# Patient Record
Sex: Female | Born: 1940 | Race: White | Hispanic: No | Marital: Married | Smoking: Former smoker
Health system: Southern US, Community
[De-identification: ages and names within clinical notes are randomized; demographics above are authoritative.]

## PROBLEM LIST (undated history)

## (undated) DIAGNOSIS — E213 Hyperparathyroidism, unspecified: Secondary | ICD-10-CM

## (undated) DIAGNOSIS — E785 Hyperlipidemia, unspecified: Secondary | ICD-10-CM

## (undated) DIAGNOSIS — I10 Essential (primary) hypertension: Secondary | ICD-10-CM

## (undated) DIAGNOSIS — J449 Chronic obstructive pulmonary disease, unspecified: Secondary | ICD-10-CM

## (undated) DIAGNOSIS — M1990 Unspecified osteoarthritis, unspecified site: Secondary | ICD-10-CM

## (undated) DIAGNOSIS — K219 Gastro-esophageal reflux disease without esophagitis: Secondary | ICD-10-CM

## (undated) DIAGNOSIS — G2581 Restless legs syndrome: Secondary | ICD-10-CM

## (undated) DIAGNOSIS — C801 Malignant (primary) neoplasm, unspecified: Secondary | ICD-10-CM

## (undated) DIAGNOSIS — M810 Age-related osteoporosis without current pathological fracture: Secondary | ICD-10-CM

## (undated) DIAGNOSIS — N184 Chronic kidney disease, stage 4 (severe): Secondary | ICD-10-CM

## (undated) HISTORY — PX: ABDOMINAL HYSTERECTOMY: SHX81

## (undated) HISTORY — PX: CHOLECYSTECTOMY: SHX55

## (undated) HISTORY — DX: Restless legs syndrome: G25.81

## (undated) HISTORY — DX: Chronic obstructive pulmonary disease, unspecified: J44.9

## (undated) HISTORY — DX: Hyperlipidemia, unspecified: E78.5

## (undated) MED FILL — Gemcitabine HCl Inj 1 GM/26.3ML (38 MG/ML) (Base Equiv): INTRAVENOUS | Qty: 1.2 | Status: AC

## (undated) MED FILL — Gemcitabine HCl Inj 1 GM/26.3ML (38 MG/ML) (Base Equiv): INTRAVENOUS | Qty: 1 | Status: AC

---

## 1997-06-11 HISTORY — PX: OTHER SURGICAL HISTORY: SHX169

## 1997-06-11 HISTORY — PX: TOTAL NEPHRECTOMY: SHX415

## 2011-06-29 DIAGNOSIS — M899 Disorder of bone, unspecified: Secondary | ICD-10-CM | POA: Diagnosis not present

## 2011-06-29 DIAGNOSIS — I10 Essential (primary) hypertension: Secondary | ICD-10-CM | POA: Diagnosis not present

## 2011-06-29 DIAGNOSIS — M949 Disorder of cartilage, unspecified: Secondary | ICD-10-CM | POA: Diagnosis not present

## 2011-06-29 DIAGNOSIS — R5383 Other fatigue: Secondary | ICD-10-CM | POA: Diagnosis not present

## 2011-06-29 DIAGNOSIS — J449 Chronic obstructive pulmonary disease, unspecified: Secondary | ICD-10-CM | POA: Diagnosis not present

## 2011-06-29 DIAGNOSIS — E7800 Pure hypercholesterolemia, unspecified: Secondary | ICD-10-CM | POA: Diagnosis not present

## 2011-08-01 DIAGNOSIS — J019 Acute sinusitis, unspecified: Secondary | ICD-10-CM | POA: Diagnosis not present

## 2011-09-27 DIAGNOSIS — K319 Disease of stomach and duodenum, unspecified: Secondary | ICD-10-CM | POA: Diagnosis not present

## 2011-09-28 DIAGNOSIS — E7800 Pure hypercholesterolemia, unspecified: Secondary | ICD-10-CM | POA: Diagnosis not present

## 2011-09-28 DIAGNOSIS — J4489 Other specified chronic obstructive pulmonary disease: Secondary | ICD-10-CM | POA: Diagnosis not present

## 2011-09-28 DIAGNOSIS — J449 Chronic obstructive pulmonary disease, unspecified: Secondary | ICD-10-CM | POA: Diagnosis not present

## 2011-09-28 DIAGNOSIS — I10 Essential (primary) hypertension: Secondary | ICD-10-CM | POA: Diagnosis not present

## 2011-10-29 DIAGNOSIS — K2901 Acute gastritis with bleeding: Secondary | ICD-10-CM | POA: Diagnosis not present

## 2011-10-29 DIAGNOSIS — K219 Gastro-esophageal reflux disease without esophagitis: Secondary | ICD-10-CM | POA: Diagnosis not present

## 2011-11-20 DIAGNOSIS — R0902 Hypoxemia: Secondary | ICD-10-CM | POA: Diagnosis not present

## 2011-11-26 DIAGNOSIS — M109 Gout, unspecified: Secondary | ICD-10-CM | POA: Diagnosis not present

## 2012-01-16 DIAGNOSIS — E782 Mixed hyperlipidemia: Secondary | ICD-10-CM | POA: Diagnosis not present

## 2012-01-16 DIAGNOSIS — Z79899 Other long term (current) drug therapy: Secondary | ICD-10-CM | POA: Diagnosis not present

## 2012-01-16 DIAGNOSIS — I10 Essential (primary) hypertension: Secondary | ICD-10-CM | POA: Diagnosis not present

## 2012-01-16 DIAGNOSIS — E7800 Pure hypercholesterolemia, unspecified: Secondary | ICD-10-CM | POA: Diagnosis not present

## 2012-01-16 DIAGNOSIS — J449 Chronic obstructive pulmonary disease, unspecified: Secondary | ICD-10-CM | POA: Diagnosis not present

## 2012-01-22 DIAGNOSIS — Z1231 Encounter for screening mammogram for malignant neoplasm of breast: Secondary | ICD-10-CM | POA: Diagnosis not present

## 2012-03-31 DIAGNOSIS — K219 Gastro-esophageal reflux disease without esophagitis: Secondary | ICD-10-CM | POA: Diagnosis not present

## 2012-03-31 DIAGNOSIS — K5900 Constipation, unspecified: Secondary | ICD-10-CM | POA: Diagnosis not present

## 2012-03-31 DIAGNOSIS — Z1211 Encounter for screening for malignant neoplasm of colon: Secondary | ICD-10-CM | POA: Diagnosis not present

## 2012-04-02 DIAGNOSIS — J029 Acute pharyngitis, unspecified: Secondary | ICD-10-CM | POA: Diagnosis not present

## 2012-04-23 DIAGNOSIS — J449 Chronic obstructive pulmonary disease, unspecified: Secondary | ICD-10-CM | POA: Diagnosis not present

## 2012-04-23 DIAGNOSIS — Z23 Encounter for immunization: Secondary | ICD-10-CM | POA: Diagnosis not present

## 2012-04-23 DIAGNOSIS — E7800 Pure hypercholesterolemia, unspecified: Secondary | ICD-10-CM | POA: Diagnosis not present

## 2012-04-23 DIAGNOSIS — I10 Essential (primary) hypertension: Secondary | ICD-10-CM | POA: Diagnosis not present

## 2012-04-24 DIAGNOSIS — D129 Benign neoplasm of anus and anal canal: Secondary | ICD-10-CM | POA: Diagnosis not present

## 2012-04-24 DIAGNOSIS — K621 Rectal polyp: Secondary | ICD-10-CM | POA: Diagnosis not present

## 2012-04-24 DIAGNOSIS — K5730 Diverticulosis of large intestine without perforation or abscess without bleeding: Secondary | ICD-10-CM | POA: Diagnosis not present

## 2012-05-09 DIAGNOSIS — K56609 Unspecified intestinal obstruction, unspecified as to partial versus complete obstruction: Secondary | ICD-10-CM | POA: Diagnosis not present

## 2012-07-21 DIAGNOSIS — J029 Acute pharyngitis, unspecified: Secondary | ICD-10-CM | POA: Diagnosis not present

## 2012-08-05 DIAGNOSIS — J449 Chronic obstructive pulmonary disease, unspecified: Secondary | ICD-10-CM | POA: Diagnosis not present

## 2012-08-05 DIAGNOSIS — M4800 Spinal stenosis, site unspecified: Secondary | ICD-10-CM | POA: Diagnosis not present

## 2012-08-05 DIAGNOSIS — E7800 Pure hypercholesterolemia, unspecified: Secondary | ICD-10-CM | POA: Diagnosis not present

## 2012-09-25 DIAGNOSIS — J019 Acute sinusitis, unspecified: Secondary | ICD-10-CM | POA: Diagnosis not present

## 2012-09-25 DIAGNOSIS — J309 Allergic rhinitis, unspecified: Secondary | ICD-10-CM | POA: Diagnosis not present

## 2012-11-05 DIAGNOSIS — J309 Allergic rhinitis, unspecified: Secondary | ICD-10-CM | POA: Diagnosis not present

## 2012-11-05 DIAGNOSIS — E210 Primary hyperparathyroidism: Secondary | ICD-10-CM | POA: Diagnosis not present

## 2012-11-05 DIAGNOSIS — I10 Essential (primary) hypertension: Secondary | ICD-10-CM | POA: Diagnosis not present

## 2012-11-05 DIAGNOSIS — E7800 Pure hypercholesterolemia, unspecified: Secondary | ICD-10-CM | POA: Diagnosis not present

## 2012-11-21 DIAGNOSIS — M899 Disorder of bone, unspecified: Secondary | ICD-10-CM | POA: Diagnosis not present

## 2012-11-21 DIAGNOSIS — M949 Disorder of cartilage, unspecified: Secondary | ICD-10-CM | POA: Diagnosis not present

## 2012-11-21 DIAGNOSIS — M810 Age-related osteoporosis without current pathological fracture: Secondary | ICD-10-CM | POA: Diagnosis not present

## 2012-12-30 DIAGNOSIS — J029 Acute pharyngitis, unspecified: Secondary | ICD-10-CM | POA: Diagnosis not present

## 2012-12-30 DIAGNOSIS — J019 Acute sinusitis, unspecified: Secondary | ICD-10-CM | POA: Diagnosis not present

## 2013-02-02 DIAGNOSIS — S92309A Fracture of unspecified metatarsal bone(s), unspecified foot, initial encounter for closed fracture: Secondary | ICD-10-CM | POA: Diagnosis not present

## 2013-02-23 DIAGNOSIS — S92309A Fracture of unspecified metatarsal bone(s), unspecified foot, initial encounter for closed fracture: Secondary | ICD-10-CM | POA: Diagnosis not present

## 2013-03-03 DIAGNOSIS — Z1231 Encounter for screening mammogram for malignant neoplasm of breast: Secondary | ICD-10-CM | POA: Diagnosis not present

## 2013-03-09 DIAGNOSIS — Z23 Encounter for immunization: Secondary | ICD-10-CM | POA: Diagnosis not present

## 2013-03-09 DIAGNOSIS — G609 Hereditary and idiopathic neuropathy, unspecified: Secondary | ICD-10-CM | POA: Diagnosis not present

## 2013-03-09 DIAGNOSIS — E785 Hyperlipidemia, unspecified: Secondary | ICD-10-CM | POA: Diagnosis not present

## 2013-03-09 DIAGNOSIS — I10 Essential (primary) hypertension: Secondary | ICD-10-CM | POA: Diagnosis not present

## 2013-03-09 DIAGNOSIS — E210 Primary hyperparathyroidism: Secondary | ICD-10-CM | POA: Diagnosis not present

## 2013-05-06 DIAGNOSIS — E875 Hyperkalemia: Secondary | ICD-10-CM | POA: Diagnosis not present

## 2013-05-06 DIAGNOSIS — J449 Chronic obstructive pulmonary disease, unspecified: Secondary | ICD-10-CM | POA: Diagnosis not present

## 2013-05-06 DIAGNOSIS — I10 Essential (primary) hypertension: Secondary | ICD-10-CM | POA: Diagnosis not present

## 2013-05-14 DIAGNOSIS — H2510 Age-related nuclear cataract, unspecified eye: Secondary | ICD-10-CM | POA: Diagnosis not present

## 2013-05-14 DIAGNOSIS — H524 Presbyopia: Secondary | ICD-10-CM | POA: Diagnosis not present

## 2013-06-17 DIAGNOSIS — J029 Acute pharyngitis, unspecified: Secondary | ICD-10-CM | POA: Diagnosis not present

## 2013-07-09 DIAGNOSIS — J449 Chronic obstructive pulmonary disease, unspecified: Secondary | ICD-10-CM | POA: Diagnosis not present

## 2013-07-09 DIAGNOSIS — I10 Essential (primary) hypertension: Secondary | ICD-10-CM | POA: Diagnosis not present

## 2013-07-09 DIAGNOSIS — E785 Hyperlipidemia, unspecified: Secondary | ICD-10-CM | POA: Diagnosis not present

## 2013-07-14 DIAGNOSIS — Z1211 Encounter for screening for malignant neoplasm of colon: Secondary | ICD-10-CM | POA: Diagnosis not present

## 2013-10-13 DIAGNOSIS — Z23 Encounter for immunization: Secondary | ICD-10-CM | POA: Diagnosis not present

## 2013-11-09 DIAGNOSIS — E785 Hyperlipidemia, unspecified: Secondary | ICD-10-CM | POA: Diagnosis not present

## 2013-11-09 DIAGNOSIS — J449 Chronic obstructive pulmonary disease, unspecified: Secondary | ICD-10-CM | POA: Diagnosis not present

## 2013-11-09 DIAGNOSIS — I10 Essential (primary) hypertension: Secondary | ICD-10-CM | POA: Diagnosis not present

## 2013-12-29 DIAGNOSIS — Z6829 Body mass index (BMI) 29.0-29.9, adult: Secondary | ICD-10-CM | POA: Diagnosis not present

## 2013-12-29 DIAGNOSIS — M4800 Spinal stenosis, site unspecified: Secondary | ICD-10-CM | POA: Diagnosis not present

## 2014-01-05 DIAGNOSIS — M1610 Unilateral primary osteoarthritis, unspecified hip: Secondary | ICD-10-CM | POA: Diagnosis not present

## 2014-01-05 DIAGNOSIS — M4310 Spondylolisthesis, site unspecified: Secondary | ICD-10-CM | POA: Diagnosis not present

## 2014-02-04 DIAGNOSIS — M545 Low back pain, unspecified: Secondary | ICD-10-CM | POA: Diagnosis not present

## 2014-02-11 DIAGNOSIS — M48061 Spinal stenosis, lumbar region without neurogenic claudication: Secondary | ICD-10-CM | POA: Diagnosis not present

## 2014-02-11 DIAGNOSIS — M4310 Spondylolisthesis, site unspecified: Secondary | ICD-10-CM | POA: Diagnosis not present

## 2014-02-11 DIAGNOSIS — Q619 Cystic kidney disease, unspecified: Secondary | ICD-10-CM | POA: Diagnosis not present

## 2014-02-11 DIAGNOSIS — M47817 Spondylosis without myelopathy or radiculopathy, lumbosacral region: Secondary | ICD-10-CM | POA: Diagnosis not present

## 2014-02-11 DIAGNOSIS — M5137 Other intervertebral disc degeneration, lumbosacral region: Secondary | ICD-10-CM | POA: Diagnosis not present

## 2014-02-16 DIAGNOSIS — M545 Low back pain, unspecified: Secondary | ICD-10-CM | POA: Diagnosis not present

## 2014-02-16 DIAGNOSIS — M4310 Spondylolisthesis, site unspecified: Secondary | ICD-10-CM | POA: Diagnosis not present

## 2014-02-16 DIAGNOSIS — M47817 Spondylosis without myelopathy or radiculopathy, lumbosacral region: Secondary | ICD-10-CM | POA: Diagnosis not present

## 2014-03-09 DIAGNOSIS — M19079 Primary osteoarthritis, unspecified ankle and foot: Secondary | ICD-10-CM | POA: Diagnosis not present

## 2014-03-09 DIAGNOSIS — G894 Chronic pain syndrome: Secondary | ICD-10-CM | POA: Diagnosis not present

## 2014-03-09 DIAGNOSIS — M4310 Spondylolisthesis, site unspecified: Secondary | ICD-10-CM | POA: Diagnosis not present

## 2014-03-09 DIAGNOSIS — M1710 Unilateral primary osteoarthritis, unspecified knee: Secondary | ICD-10-CM | POA: Diagnosis not present

## 2014-03-09 DIAGNOSIS — IMO0002 Reserved for concepts with insufficient information to code with codable children: Secondary | ICD-10-CM | POA: Diagnosis not present

## 2014-03-09 DIAGNOSIS — M47817 Spondylosis without myelopathy or radiculopathy, lumbosacral region: Secondary | ICD-10-CM | POA: Diagnosis not present

## 2014-03-18 DIAGNOSIS — E210 Primary hyperparathyroidism: Secondary | ICD-10-CM | POA: Diagnosis not present

## 2014-03-18 DIAGNOSIS — I10 Essential (primary) hypertension: Secondary | ICD-10-CM | POA: Diagnosis not present

## 2014-03-18 DIAGNOSIS — E785 Hyperlipidemia, unspecified: Secondary | ICD-10-CM | POA: Diagnosis not present

## 2014-03-18 DIAGNOSIS — Z6828 Body mass index (BMI) 28.0-28.9, adult: Secondary | ICD-10-CM | POA: Diagnosis not present

## 2014-03-18 DIAGNOSIS — Z23 Encounter for immunization: Secondary | ICD-10-CM | POA: Diagnosis not present

## 2014-03-18 DIAGNOSIS — G2581 Restless legs syndrome: Secondary | ICD-10-CM | POA: Diagnosis not present

## 2014-03-18 DIAGNOSIS — J449 Chronic obstructive pulmonary disease, unspecified: Secondary | ICD-10-CM | POA: Diagnosis not present

## 2014-03-19 DIAGNOSIS — Z1231 Encounter for screening mammogram for malignant neoplasm of breast: Secondary | ICD-10-CM | POA: Diagnosis not present

## 2014-04-01 DIAGNOSIS — M19079 Primary osteoarthritis, unspecified ankle and foot: Secondary | ICD-10-CM | POA: Diagnosis not present

## 2014-04-01 DIAGNOSIS — M47816 Spondylosis without myelopathy or radiculopathy, lumbar region: Secondary | ICD-10-CM | POA: Diagnosis not present

## 2014-04-01 DIAGNOSIS — M170 Bilateral primary osteoarthritis of knee: Secondary | ICD-10-CM | POA: Diagnosis not present

## 2014-04-01 DIAGNOSIS — G894 Chronic pain syndrome: Secondary | ICD-10-CM | POA: Diagnosis not present

## 2014-04-01 DIAGNOSIS — M4306 Spondylolysis, lumbar region: Secondary | ICD-10-CM | POA: Diagnosis not present

## 2014-05-14 DIAGNOSIS — H26492 Other secondary cataract, left eye: Secondary | ICD-10-CM | POA: Diagnosis not present

## 2014-05-14 DIAGNOSIS — H2511 Age-related nuclear cataract, right eye: Secondary | ICD-10-CM | POA: Diagnosis not present

## 2014-05-20 DIAGNOSIS — H26492 Other secondary cataract, left eye: Secondary | ICD-10-CM | POA: Diagnosis not present

## 2014-06-11 HISTORY — PX: BACK SURGERY: SHX140

## 2014-06-29 DIAGNOSIS — G809 Cerebral palsy, unspecified: Secondary | ICD-10-CM | POA: Diagnosis not present

## 2014-06-29 DIAGNOSIS — M1990 Unspecified osteoarthritis, unspecified site: Secondary | ICD-10-CM | POA: Diagnosis not present

## 2014-06-29 DIAGNOSIS — Z6828 Body mass index (BMI) 28.0-28.9, adult: Secondary | ICD-10-CM | POA: Diagnosis not present

## 2014-07-08 DIAGNOSIS — L578 Other skin changes due to chronic exposure to nonionizing radiation: Secondary | ICD-10-CM | POA: Diagnosis not present

## 2014-07-08 DIAGNOSIS — L820 Inflamed seborrheic keratosis: Secondary | ICD-10-CM | POA: Diagnosis not present

## 2014-07-08 DIAGNOSIS — D485 Neoplasm of uncertain behavior of skin: Secondary | ICD-10-CM | POA: Diagnosis not present

## 2014-07-08 DIAGNOSIS — L859 Epidermal thickening, unspecified: Secondary | ICD-10-CM | POA: Diagnosis not present

## 2014-07-13 DIAGNOSIS — G2581 Restless legs syndrome: Secondary | ICD-10-CM | POA: Diagnosis not present

## 2014-07-13 DIAGNOSIS — G609 Hereditary and idiopathic neuropathy, unspecified: Secondary | ICD-10-CM | POA: Diagnosis not present

## 2014-07-30 DIAGNOSIS — E785 Hyperlipidemia, unspecified: Secondary | ICD-10-CM | POA: Diagnosis not present

## 2014-07-30 DIAGNOSIS — G2581 Restless legs syndrome: Secondary | ICD-10-CM | POA: Diagnosis not present

## 2014-07-30 DIAGNOSIS — G809 Cerebral palsy, unspecified: Secondary | ICD-10-CM | POA: Diagnosis not present

## 2014-07-30 DIAGNOSIS — I10 Essential (primary) hypertension: Secondary | ICD-10-CM | POA: Diagnosis not present

## 2014-10-28 DIAGNOSIS — N183 Chronic kidney disease, stage 3 (moderate): Secondary | ICD-10-CM | POA: Diagnosis not present

## 2014-10-28 DIAGNOSIS — E210 Primary hyperparathyroidism: Secondary | ICD-10-CM | POA: Diagnosis not present

## 2014-10-28 DIAGNOSIS — E785 Hyperlipidemia, unspecified: Secondary | ICD-10-CM | POA: Diagnosis not present

## 2014-10-28 DIAGNOSIS — Z6827 Body mass index (BMI) 27.0-27.9, adult: Secondary | ICD-10-CM | POA: Diagnosis not present

## 2014-10-28 DIAGNOSIS — I10 Essential (primary) hypertension: Secondary | ICD-10-CM | POA: Diagnosis not present

## 2014-11-22 DIAGNOSIS — H2511 Age-related nuclear cataract, right eye: Secondary | ICD-10-CM | POA: Diagnosis not present

## 2014-11-22 DIAGNOSIS — H524 Presbyopia: Secondary | ICD-10-CM | POA: Diagnosis not present

## 2014-12-23 DIAGNOSIS — Z0000 Encounter for general adult medical examination without abnormal findings: Secondary | ICD-10-CM | POA: Diagnosis not present

## 2014-12-23 DIAGNOSIS — Z6827 Body mass index (BMI) 27.0-27.9, adult: Secondary | ICD-10-CM | POA: Diagnosis not present

## 2015-01-03 DIAGNOSIS — M810 Age-related osteoporosis without current pathological fracture: Secondary | ICD-10-CM | POA: Diagnosis not present

## 2015-02-04 DIAGNOSIS — N183 Chronic kidney disease, stage 3 (moderate): Secondary | ICD-10-CM | POA: Diagnosis not present

## 2015-02-04 DIAGNOSIS — E785 Hyperlipidemia, unspecified: Secondary | ICD-10-CM | POA: Diagnosis not present

## 2015-02-04 DIAGNOSIS — E210 Primary hyperparathyroidism: Secondary | ICD-10-CM | POA: Diagnosis not present

## 2015-02-04 DIAGNOSIS — Z6826 Body mass index (BMI) 26.0-26.9, adult: Secondary | ICD-10-CM | POA: Diagnosis not present

## 2015-02-04 DIAGNOSIS — I10 Essential (primary) hypertension: Secondary | ICD-10-CM | POA: Diagnosis not present

## 2015-02-04 DIAGNOSIS — G609 Hereditary and idiopathic neuropathy, unspecified: Secondary | ICD-10-CM | POA: Diagnosis not present

## 2015-02-04 DIAGNOSIS — M4800 Spinal stenosis, site unspecified: Secondary | ICD-10-CM | POA: Diagnosis not present

## 2015-02-09 DIAGNOSIS — Z1211 Encounter for screening for malignant neoplasm of colon: Secondary | ICD-10-CM | POA: Diagnosis not present

## 2015-03-21 DIAGNOSIS — Z1231 Encounter for screening mammogram for malignant neoplasm of breast: Secondary | ICD-10-CM | POA: Diagnosis not present

## 2015-03-22 DIAGNOSIS — Z23 Encounter for immunization: Secondary | ICD-10-CM | POA: Diagnosis not present

## 2015-04-08 DIAGNOSIS — J029 Acute pharyngitis, unspecified: Secondary | ICD-10-CM | POA: Diagnosis not present

## 2015-04-08 DIAGNOSIS — Z6826 Body mass index (BMI) 26.0-26.9, adult: Secondary | ICD-10-CM | POA: Diagnosis not present

## 2015-04-12 DIAGNOSIS — H0013 Chalazion right eye, unspecified eyelid: Secondary | ICD-10-CM | POA: Diagnosis not present

## 2015-05-24 DIAGNOSIS — M47816 Spondylosis without myelopathy or radiculopathy, lumbar region: Secondary | ICD-10-CM | POA: Diagnosis not present

## 2015-05-24 DIAGNOSIS — M4316 Spondylolisthesis, lumbar region: Secondary | ICD-10-CM | POA: Diagnosis not present

## 2015-07-05 DIAGNOSIS — M47816 Spondylosis without myelopathy or radiculopathy, lumbar region: Secondary | ICD-10-CM | POA: Diagnosis not present

## 2015-07-05 DIAGNOSIS — M4316 Spondylolisthesis, lumbar region: Secondary | ICD-10-CM | POA: Diagnosis not present

## 2015-07-07 DIAGNOSIS — M4726 Other spondylosis with radiculopathy, lumbar region: Secondary | ICD-10-CM | POA: Diagnosis not present

## 2015-07-07 DIAGNOSIS — M5117 Intervertebral disc disorders with radiculopathy, lumbosacral region: Secondary | ICD-10-CM | POA: Diagnosis not present

## 2015-07-07 DIAGNOSIS — M5126 Other intervertebral disc displacement, lumbar region: Secondary | ICD-10-CM | POA: Diagnosis not present

## 2015-07-07 DIAGNOSIS — Z905 Acquired absence of kidney: Secondary | ICD-10-CM | POA: Diagnosis not present

## 2015-07-07 DIAGNOSIS — M4316 Spondylolisthesis, lumbar region: Secondary | ICD-10-CM | POA: Diagnosis not present

## 2015-07-07 DIAGNOSIS — M5116 Intervertebral disc disorders with radiculopathy, lumbar region: Secondary | ICD-10-CM | POA: Diagnosis not present

## 2015-07-07 DIAGNOSIS — Z85528 Personal history of other malignant neoplasm of kidney: Secondary | ICD-10-CM | POA: Diagnosis not present

## 2015-07-08 DIAGNOSIS — M5416 Radiculopathy, lumbar region: Secondary | ICD-10-CM | POA: Diagnosis not present

## 2015-07-08 DIAGNOSIS — M4316 Spondylolisthesis, lumbar region: Secondary | ICD-10-CM | POA: Diagnosis not present

## 2015-07-15 DIAGNOSIS — M4316 Spondylolisthesis, lumbar region: Secondary | ICD-10-CM | POA: Diagnosis not present

## 2015-07-15 DIAGNOSIS — M5416 Radiculopathy, lumbar region: Secondary | ICD-10-CM | POA: Diagnosis not present

## 2015-07-29 DIAGNOSIS — M5416 Radiculopathy, lumbar region: Secondary | ICD-10-CM | POA: Diagnosis not present

## 2015-07-29 DIAGNOSIS — M5116 Intervertebral disc disorders with radiculopathy, lumbar region: Secondary | ICD-10-CM | POA: Diagnosis not present

## 2015-07-29 DIAGNOSIS — M5136 Other intervertebral disc degeneration, lumbar region: Secondary | ICD-10-CM | POA: Diagnosis not present

## 2015-08-03 DIAGNOSIS — J449 Chronic obstructive pulmonary disease, unspecified: Secondary | ICD-10-CM | POA: Diagnosis not present

## 2015-08-03 DIAGNOSIS — E785 Hyperlipidemia, unspecified: Secondary | ICD-10-CM | POA: Diagnosis not present

## 2015-08-03 DIAGNOSIS — N183 Chronic kidney disease, stage 3 (moderate): Secondary | ICD-10-CM | POA: Diagnosis not present

## 2015-08-03 DIAGNOSIS — Z6826 Body mass index (BMI) 26.0-26.9, adult: Secondary | ICD-10-CM | POA: Diagnosis not present

## 2015-08-03 DIAGNOSIS — E210 Primary hyperparathyroidism: Secondary | ICD-10-CM | POA: Diagnosis not present

## 2015-08-03 DIAGNOSIS — I10 Essential (primary) hypertension: Secondary | ICD-10-CM | POA: Diagnosis not present

## 2015-08-03 DIAGNOSIS — M810 Age-related osteoporosis without current pathological fracture: Secondary | ICD-10-CM | POA: Diagnosis not present

## 2015-08-22 DIAGNOSIS — H26492 Other secondary cataract, left eye: Secondary | ICD-10-CM | POA: Diagnosis not present

## 2015-09-27 DIAGNOSIS — M4316 Spondylolisthesis, lumbar region: Secondary | ICD-10-CM | POA: Diagnosis not present

## 2015-09-28 DIAGNOSIS — M79609 Pain in unspecified limb: Secondary | ICD-10-CM | POA: Diagnosis not present

## 2015-09-28 DIAGNOSIS — E559 Vitamin D deficiency, unspecified: Secondary | ICD-10-CM | POA: Diagnosis not present

## 2015-09-28 DIAGNOSIS — Z01810 Encounter for preprocedural cardiovascular examination: Secondary | ICD-10-CM | POA: Diagnosis not present

## 2015-09-28 DIAGNOSIS — Z79899 Other long term (current) drug therapy: Secondary | ICD-10-CM | POA: Diagnosis not present

## 2015-09-28 DIAGNOSIS — Z01812 Encounter for preprocedural laboratory examination: Secondary | ICD-10-CM | POA: Diagnosis not present

## 2015-09-28 DIAGNOSIS — Z01818 Encounter for other preprocedural examination: Secondary | ICD-10-CM | POA: Diagnosis not present

## 2015-10-10 DIAGNOSIS — Z87891 Personal history of nicotine dependence: Secondary | ICD-10-CM | POA: Diagnosis not present

## 2015-10-10 DIAGNOSIS — M5136 Other intervertebral disc degeneration, lumbar region: Secondary | ICD-10-CM | POA: Diagnosis not present

## 2015-10-10 DIAGNOSIS — M47816 Spondylosis without myelopathy or radiculopathy, lumbar region: Secondary | ICD-10-CM | POA: Diagnosis not present

## 2015-10-10 DIAGNOSIS — M1610 Unilateral primary osteoarthritis, unspecified hip: Secondary | ICD-10-CM | POA: Diagnosis present

## 2015-10-10 DIAGNOSIS — J449 Chronic obstructive pulmonary disease, unspecified: Secondary | ICD-10-CM | POA: Diagnosis present

## 2015-10-10 DIAGNOSIS — I10 Essential (primary) hypertension: Secondary | ICD-10-CM | POA: Diagnosis present

## 2015-10-10 DIAGNOSIS — K219 Gastro-esophageal reflux disease without esophagitis: Secondary | ICD-10-CM | POA: Diagnosis present

## 2015-10-10 DIAGNOSIS — Z85528 Personal history of other malignant neoplasm of kidney: Secondary | ICD-10-CM | POA: Diagnosis not present

## 2015-10-10 DIAGNOSIS — M545 Low back pain: Secondary | ICD-10-CM | POA: Diagnosis not present

## 2015-10-10 DIAGNOSIS — E7800 Pure hypercholesterolemia, unspecified: Secondary | ICD-10-CM | POA: Diagnosis present

## 2015-10-10 DIAGNOSIS — M5116 Intervertebral disc disorders with radiculopathy, lumbar region: Secondary | ICD-10-CM | POA: Diagnosis not present

## 2015-10-10 DIAGNOSIS — M4316 Spondylolisthesis, lumbar region: Secondary | ICD-10-CM | POA: Diagnosis not present

## 2015-10-10 DIAGNOSIS — M4806 Spinal stenosis, lumbar region: Secondary | ICD-10-CM | POA: Diagnosis not present

## 2015-10-10 DIAGNOSIS — M4726 Other spondylosis with radiculopathy, lumbar region: Secondary | ICD-10-CM | POA: Diagnosis present

## 2015-10-10 DIAGNOSIS — M5416 Radiculopathy, lumbar region: Secondary | ICD-10-CM | POA: Diagnosis not present

## 2015-10-10 DIAGNOSIS — Z79899 Other long term (current) drug therapy: Secondary | ICD-10-CM | POA: Diagnosis not present

## 2015-10-10 HISTORY — PX: OTHER SURGICAL HISTORY: SHX169

## 2015-11-22 DIAGNOSIS — M4316 Spondylolisthesis, lumbar region: Secondary | ICD-10-CM | POA: Diagnosis not present

## 2015-12-28 DIAGNOSIS — Z6825 Body mass index (BMI) 25.0-25.9, adult: Secondary | ICD-10-CM | POA: Diagnosis not present

## 2015-12-28 DIAGNOSIS — K5903 Drug induced constipation: Secondary | ICD-10-CM | POA: Diagnosis not present

## 2016-01-03 DIAGNOSIS — M4316 Spondylolisthesis, lumbar region: Secondary | ICD-10-CM | POA: Diagnosis not present

## 2016-01-31 DIAGNOSIS — G8929 Other chronic pain: Secondary | ICD-10-CM | POA: Diagnosis not present

## 2016-01-31 DIAGNOSIS — E785 Hyperlipidemia, unspecified: Secondary | ICD-10-CM | POA: Diagnosis not present

## 2016-01-31 DIAGNOSIS — Z6825 Body mass index (BMI) 25.0-25.9, adult: Secondary | ICD-10-CM | POA: Diagnosis not present

## 2016-01-31 DIAGNOSIS — G809 Cerebral palsy, unspecified: Secondary | ICD-10-CM | POA: Diagnosis not present

## 2016-01-31 DIAGNOSIS — I10 Essential (primary) hypertension: Secondary | ICD-10-CM | POA: Diagnosis not present

## 2016-01-31 DIAGNOSIS — E210 Primary hyperparathyroidism: Secondary | ICD-10-CM | POA: Diagnosis not present

## 2016-01-31 DIAGNOSIS — M810 Age-related osteoporosis without current pathological fracture: Secondary | ICD-10-CM | POA: Diagnosis not present

## 2016-02-06 DIAGNOSIS — M4800 Spinal stenosis, site unspecified: Secondary | ICD-10-CM | POA: Diagnosis not present

## 2016-02-06 DIAGNOSIS — N8110 Cystocele, unspecified: Secondary | ICD-10-CM | POA: Diagnosis not present

## 2016-02-06 DIAGNOSIS — Z6825 Body mass index (BMI) 25.0-25.9, adult: Secondary | ICD-10-CM | POA: Diagnosis not present

## 2016-02-07 DIAGNOSIS — M4316 Spondylolisthesis, lumbar region: Secondary | ICD-10-CM | POA: Diagnosis not present

## 2016-02-10 ENCOUNTER — Other Ambulatory Visit: Payer: Self-pay

## 2016-02-10 DIAGNOSIS — N814 Uterovaginal prolapse, unspecified: Secondary | ICD-10-CM | POA: Diagnosis not present

## 2016-02-10 DIAGNOSIS — C642 Malignant neoplasm of left kidney, except renal pelvis: Secondary | ICD-10-CM | POA: Diagnosis not present

## 2016-02-10 DIAGNOSIS — N3000 Acute cystitis without hematuria: Secondary | ICD-10-CM | POA: Diagnosis not present

## 2016-02-10 DIAGNOSIS — N318 Other neuromuscular dysfunction of bladder: Secondary | ICD-10-CM | POA: Diagnosis not present

## 2016-02-15 DIAGNOSIS — C642 Malignant neoplasm of left kidney, except renal pelvis: Secondary | ICD-10-CM | POA: Diagnosis not present

## 2016-02-15 DIAGNOSIS — N2889 Other specified disorders of kidney and ureter: Secondary | ICD-10-CM | POA: Diagnosis not present

## 2016-02-16 DIAGNOSIS — N183 Chronic kidney disease, stage 3 (moderate): Secondary | ICD-10-CM | POA: Diagnosis not present

## 2016-02-16 DIAGNOSIS — R600 Localized edema: Secondary | ICD-10-CM | POA: Diagnosis not present

## 2016-02-17 DIAGNOSIS — Z6825 Body mass index (BMI) 25.0-25.9, adult: Secondary | ICD-10-CM | POA: Diagnosis not present

## 2016-02-17 DIAGNOSIS — M4800 Spinal stenosis, site unspecified: Secondary | ICD-10-CM | POA: Diagnosis not present

## 2016-02-17 DIAGNOSIS — E785 Hyperlipidemia, unspecified: Secondary | ICD-10-CM | POA: Diagnosis not present

## 2016-02-17 DIAGNOSIS — M549 Dorsalgia, unspecified: Secondary | ICD-10-CM | POA: Diagnosis not present

## 2016-02-17 DIAGNOSIS — N8110 Cystocele, unspecified: Secondary | ICD-10-CM | POA: Diagnosis not present

## 2016-02-27 DIAGNOSIS — Z85528 Personal history of other malignant neoplasm of kidney: Secondary | ICD-10-CM | POA: Insufficient documentation

## 2016-02-27 DIAGNOSIS — E785 Hyperlipidemia, unspecified: Secondary | ICD-10-CM | POA: Insufficient documentation

## 2016-02-27 DIAGNOSIS — N816 Rectocele: Secondary | ICD-10-CM | POA: Diagnosis not present

## 2016-02-27 DIAGNOSIS — M4800 Spinal stenosis, site unspecified: Secondary | ICD-10-CM | POA: Insufficient documentation

## 2016-02-27 DIAGNOSIS — I10 Essential (primary) hypertension: Secondary | ICD-10-CM | POA: Insufficient documentation

## 2016-02-27 DIAGNOSIS — M810 Age-related osteoporosis without current pathological fracture: Secondary | ICD-10-CM | POA: Insufficient documentation

## 2016-02-27 DIAGNOSIS — Z85831 Personal history of malignant neoplasm of soft tissue: Secondary | ICD-10-CM | POA: Insufficient documentation

## 2016-02-27 DIAGNOSIS — N8111 Cystocele, midline: Secondary | ICD-10-CM | POA: Diagnosis not present

## 2016-02-28 DIAGNOSIS — M4316 Spondylolisthesis, lumbar region: Secondary | ICD-10-CM | POA: Diagnosis not present

## 2016-03-02 DIAGNOSIS — N3020 Other chronic cystitis without hematuria: Secondary | ICD-10-CM | POA: Diagnosis not present

## 2016-03-02 DIAGNOSIS — C642 Malignant neoplasm of left kidney, except renal pelvis: Secondary | ICD-10-CM | POA: Diagnosis not present

## 2016-03-19 DIAGNOSIS — Z23 Encounter for immunization: Secondary | ICD-10-CM | POA: Diagnosis not present

## 2016-03-21 DIAGNOSIS — Z7982 Long term (current) use of aspirin: Secondary | ICD-10-CM | POA: Diagnosis not present

## 2016-03-21 DIAGNOSIS — N816 Rectocele: Secondary | ICD-10-CM | POA: Diagnosis not present

## 2016-03-21 DIAGNOSIS — Z79899 Other long term (current) drug therapy: Secondary | ICD-10-CM | POA: Diagnosis not present

## 2016-03-21 DIAGNOSIS — N8111 Cystocele, midline: Secondary | ICD-10-CM | POA: Diagnosis not present

## 2016-03-21 DIAGNOSIS — I10 Essential (primary) hypertension: Secondary | ICD-10-CM | POA: Diagnosis not present

## 2016-03-21 DIAGNOSIS — N815 Vaginal enterocele: Secondary | ICD-10-CM | POA: Diagnosis not present

## 2016-03-21 DIAGNOSIS — E785 Hyperlipidemia, unspecified: Secondary | ICD-10-CM | POA: Diagnosis not present

## 2016-03-21 DIAGNOSIS — N8110 Cystocele, unspecified: Secondary | ICD-10-CM | POA: Diagnosis not present

## 2016-03-22 DIAGNOSIS — E785 Hyperlipidemia, unspecified: Secondary | ICD-10-CM | POA: Diagnosis not present

## 2016-03-22 DIAGNOSIS — I10 Essential (primary) hypertension: Secondary | ICD-10-CM | POA: Diagnosis not present

## 2016-03-22 DIAGNOSIS — N8111 Cystocele, midline: Secondary | ICD-10-CM | POA: Diagnosis not present

## 2016-03-22 DIAGNOSIS — Z79899 Other long term (current) drug therapy: Secondary | ICD-10-CM | POA: Diagnosis not present

## 2016-03-22 DIAGNOSIS — N816 Rectocele: Secondary | ICD-10-CM | POA: Diagnosis not present

## 2016-03-22 DIAGNOSIS — Z7982 Long term (current) use of aspirin: Secondary | ICD-10-CM | POA: Diagnosis not present

## 2016-04-10 DIAGNOSIS — Z1231 Encounter for screening mammogram for malignant neoplasm of breast: Secondary | ICD-10-CM | POA: Diagnosis not present

## 2016-04-12 DIAGNOSIS — N183 Chronic kidney disease, stage 3 (moderate): Secondary | ICD-10-CM | POA: Diagnosis not present

## 2016-04-12 DIAGNOSIS — I10 Essential (primary) hypertension: Secondary | ICD-10-CM | POA: Diagnosis not present

## 2016-04-12 DIAGNOSIS — R399 Unspecified symptoms and signs involving the genitourinary system: Secondary | ICD-10-CM | POA: Diagnosis not present

## 2016-04-12 DIAGNOSIS — G2581 Restless legs syndrome: Secondary | ICD-10-CM | POA: Diagnosis not present

## 2016-04-12 DIAGNOSIS — M810 Age-related osteoporosis without current pathological fracture: Secondary | ICD-10-CM | POA: Diagnosis not present

## 2016-04-12 DIAGNOSIS — Z85528 Personal history of other malignant neoplasm of kidney: Secondary | ICD-10-CM | POA: Diagnosis not present

## 2016-04-12 DIAGNOSIS — N390 Urinary tract infection, site not specified: Secondary | ICD-10-CM | POA: Diagnosis not present

## 2016-04-13 DIAGNOSIS — N810 Urethrocele: Secondary | ICD-10-CM | POA: Diagnosis not present

## 2016-04-13 DIAGNOSIS — Z6824 Body mass index (BMI) 24.0-24.9, adult: Secondary | ICD-10-CM | POA: Diagnosis not present

## 2016-04-13 DIAGNOSIS — G2581 Restless legs syndrome: Secondary | ICD-10-CM | POA: Diagnosis not present

## 2016-04-13 DIAGNOSIS — I10 Essential (primary) hypertension: Secondary | ICD-10-CM | POA: Diagnosis not present

## 2016-04-16 DIAGNOSIS — N183 Chronic kidney disease, stage 3 (moderate): Secondary | ICD-10-CM | POA: Diagnosis not present

## 2016-04-30 DIAGNOSIS — G609 Hereditary and idiopathic neuropathy, unspecified: Secondary | ICD-10-CM | POA: Diagnosis not present

## 2016-04-30 DIAGNOSIS — N183 Chronic kidney disease, stage 3 (moderate): Secondary | ICD-10-CM | POA: Diagnosis not present

## 2016-05-09 DIAGNOSIS — M25572 Pain in left ankle and joints of left foot: Secondary | ICD-10-CM | POA: Diagnosis not present

## 2016-05-09 DIAGNOSIS — M25571 Pain in right ankle and joints of right foot: Secondary | ICD-10-CM | POA: Diagnosis not present

## 2016-05-16 ENCOUNTER — Other Ambulatory Visit (HOSPITAL_COMMUNITY): Payer: Self-pay | Admitting: Surgery

## 2016-06-01 DIAGNOSIS — E041 Nontoxic single thyroid nodule: Secondary | ICD-10-CM | POA: Diagnosis not present

## 2016-06-13 ENCOUNTER — Ambulatory Visit: Payer: Self-pay | Admitting: Surgery

## 2016-06-20 DIAGNOSIS — M25571 Pain in right ankle and joints of right foot: Secondary | ICD-10-CM | POA: Diagnosis not present

## 2016-06-20 DIAGNOSIS — M47816 Spondylosis without myelopathy or radiculopathy, lumbar region: Secondary | ICD-10-CM | POA: Diagnosis not present

## 2016-06-22 DIAGNOSIS — R262 Difficulty in walking, not elsewhere classified: Secondary | ICD-10-CM | POA: Diagnosis not present

## 2016-06-22 DIAGNOSIS — M25579 Pain in unspecified ankle and joints of unspecified foot: Secondary | ICD-10-CM | POA: Diagnosis not present

## 2016-06-22 DIAGNOSIS — M545 Low back pain: Secondary | ICD-10-CM | POA: Diagnosis not present

## 2016-06-25 ENCOUNTER — Encounter (HOSPITAL_COMMUNITY): Payer: Self-pay | Admitting: Surgery

## 2016-06-25 DIAGNOSIS — E210 Primary hyperparathyroidism: Secondary | ICD-10-CM | POA: Diagnosis present

## 2016-06-25 HISTORY — DX: Primary hyperparathyroidism: E21.0

## 2016-06-25 NOTE — H&P (Signed)
General Surgery [facility]  Lenn Cal. Klar DOB: 1940/12/14 Married / Language: English / Race: White Female  History of Present Illness   The patient is a 76 year old female who presents with a parathyroid neoplasm.  Patient is referred by Dr. 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 Patient was referred to nephrology for evaluation of chronic kidney disease. 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 She also has osteoporosis. She denies any history of nephrolithiasis. She does note chronic fatigue as well as bone and joint pain. Laboratory studies showed an elevated serum calcium level of 12.4. Intact PTH level is reportedly elevated but I do not have those laboratory records. Patient was started on Sensipar approximately 2 months ago. She has not had any imaging studies performed. Patient denies any family history of parathyroid disease or other endocrine neoplasm. Patient has had no prior head or neck surgery.   Other Problems Arthritis  Back Pain  Cancer  High blood pressure  Hypercholesterolemia   Past Surgical History  Cataract Surgery  Left. Nephrectomy  Left. Spinal Surgery - Lower Back   Diagnostic Studies History Colonoscopy  1-5 years ago  Allergies  No Known Drug Allergies 05/15/2016  Medication History  Furosemide (20MG  Tablet, Oral daily) Active. Gabapentin (300MG  Capsule, Oral daily) Active. Sensipar (30MG  Tablet, Oral two times daily) Active. Ventolin HFA (108 (90 Base)MCG/ACT Aerosol Soln, Inhalation as needed) Active. Aspirin (81MG  Tablet Chewable, Oral daily) Active. Medications Reconciled  Social History  Caffeine use  Coffee. No alcohol use  No drug use  Tobacco use  Former smoker.  Family History  Arthritis  Brother, Mother, Sister. Cancer  Sister. Diabetes Mellitus  Brother. Prostate Cancer  Brother. Respiratory  Condition  Mother. Thyroid problems  Sister.   Review of Systems General Present- Fatigue. Not Present- Appetite Loss, Chills, Fever, Night Sweats, Weight Gain and Weight Loss. Skin Not Present- Change in Wart/Mole, Dryness, Hives, Jaundice, New Lesions, Non-Healing Wounds, Rash and Ulcer. HEENT Present- Wears glasses/contact lenses. Not Present- Earache, Hearing Loss, Hoarseness, Nose Bleed, Oral Ulcers, Ringing in the Ears, Seasonal Allergies, Sinus Pain, Sore Throat, Visual Disturbances and Yellow Eyes. Respiratory Present- Difficulty Breathing and Snoring. Not Present- Bloody sputum, Chronic Cough and Wheezing. Breast Not Present- Breast Mass, Breast Pain, Nipple Discharge and Skin Changes. Cardiovascular Present- Leg Cramps and Swelling of Extremities. Not Present- Chest Pain, Difficulty Breathing Lying Down, Palpitations, Rapid Heart Rate and Shortness of Breath. Gastrointestinal Present- Bloating, Excessive gas and Gets full quickly at meals. Not Present- Abdominal Pain, Bloody Stool, Change in Bowel Habits, Chronic diarrhea, Constipation, Difficulty Swallowing, Hemorrhoids, Indigestion, Nausea, Rectal Pain and Vomiting. Musculoskeletal Present- Back Pain, Joint Pain, Joint Stiffness, Muscle Pain, Muscle Weakness and Swelling of Extremities. Neurological Present- Weakness. Not Present- Decreased Memory, Fainting, Headaches, Numbness, Seizures, Tingling, Tremor and Trouble walking. Psychiatric Present- Change in Sleep Pattern. Not Present- Anxiety, Bipolar, Depression, Fearful and Frequent crying. Endocrine Present- Cold Intolerance. Not Present- Excessive Hunger, Hair Changes, Heat Intolerance, Hot flashes and New Diabetes. Hematology Present- Easy Bruising. Not Present- Blood Thinners, Excessive bleeding, Gland problems, HIV and Persistent Infections.  Vitals  Weight: 150.6 lb Height: 64in Body Surface Area: 1.73 m Body Mass Index: 25.85 kg/m  Temp.: 98.60F  Pulse: 68  (Regular)  BP: 122/78 (Sitting, Left Arm, Standard)   Physical Exam The physical exam findings are as follows: Note:General - appears comfortable, no distress; not diaphorectic  HEENT - normocephalic; sclerae clear, gaze conjugate; mucous  membranes moist, dentition good; voice normal  Neck - symmetric on extension; no palpable anterior or posterior cervical adenopathy; no palpable masses in the thyroid bed  Chest - clear bilaterally without rhonchi, rales, or wheeze  Cor - regular rhythm with normal rate; no significant murmur  Ext - non-tender without significant edema or lymphedema  Neuro - grossly intact; no tremor    Assessment & Plan  HYPERCALCEMIA (E83.52)  Pt Education - Pamphlet Given - The Parathyroid Surgery Book: discussed with patient and provided information. Follow Up - Call [facility] office after tests / studies done to discuss further plans  Patient is referred for evaluation of hypercalcemia and suspected primary hyperparathyroidism. Patient is given written literature on parathyroid surgery to review at home.  I have recommended repeating her laboratory studies including a calcium level, intact PTH level, and 25-hydroxy vitamin D level. We will obtain those labs today. Patient will then be scheduled for a nuclear medicine parathyroid scan in the near future in hopes of confirming the diagnosis and localizing a parathyroid adenoma. If the sestamibi scan is unrevealing, then we will obtain a high resolution ultrasound. If the ultrasound does not identify an adenoma, we will consider a 4D CT scan of the neck.  I will contact the patient with the results of her studies. She and I discussed minimally invasive parathyroidectomy today. We discussed the location of the surgical incision. We discussed performing this procedure as an outpatient surgery. We discussed the postoperative recovery. She understands. Patient wishes to proceed with further evaluation.  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  Plan minimally invasive parathyroidectomy.  The risks and benefits of the procedure have been discussed at length with the patient.  The patient understands the proposed procedure, potential alternative treatments, and the course of recovery to be expected.  All of the patient's questions have been answered at this time.  The patient wishes to proceed with surgery.  Earnstine Regal, MD, [facility] Office: [phone]

## 2016-06-25 NOTE — Patient Instructions (Signed)
Alexandra [name]  06/25/2016   Your procedure is scheduled on: 06-28-2016  Report to [facility] Main  Entrance take [employer]  elevators to 3rd floor to  [place] at [phone].  Call this number if you have problems the morning of surgery [phone]   Remember: ONLY 1 PERSON MAY GO WITH YOU TO SHORT STAY TO GET  READY MORNING OF YOUR SURGERY.  Do not eat food or drink liquids :After Midnight.     Take these medicines the morning of surgery with A SIP OF WATER: Amlodipine(Norvasc), Simvastatin(Zocor) DO NOT TAKE ANY DIABETIC MEDICATIONS DAY OF YOUR SURGERY                               You may not have any metal on your body including hair pins and              piercings  Do not wear jewelry, make-up, lotions, powders or perfumes, deodorant             Do not wear nail polish.  Do not shave  48 hours prior to surgery.              Men may shave face and neck.   Do not bring valuables to the hospital. [place].  Contacts, dentures or bridgework may not be worn into surgery.  Leave suitcase in the car. After surgery it may be brought to your room.     Patients discharged the day of surgery will not be allowed to drive home.  Name and phone number of your driver:  Special Instructions: N/A              Please read over the following fact sheets you were given: _____________________________________________________________________             [facility] - Preparing for Surgery Before surgery, you can play an important role.  Because skin is not sterile, your skin needs to be as free of germs as possible.  You can reduce the number of germs on your skin by washing with CHG (chlorahexidine gluconate) soap before surgery.  CHG is an antiseptic cleaner which kills germs and bonds with the skin to continue killing germs even after washing. Please DO NOT use if you have an allergy to CHG or antibacterial soaps.  If  your skin becomes reddened/irritated stop using the CHG and inform your nurse when you arrive at Short Stay. Do not shave (including legs and underarms) for at least 48 hours prior to the first CHG shower.  You may shave your face/neck. Please follow these instructions carefully:  1.  Shower with CHG Soap the night before surgery and the  morning of Surgery.  2.  If you choose to wash your hair, wash your hair first as usual with your  normal  shampoo.  3.  After you shampoo, rinse your hair and body thoroughly to remove the  shampoo.                           4.  Use CHG as you would any other liquid soap.  You can apply chg directly  to the skin and wash  Gently with a scrungie or clean washcloth.  5.  Apply the CHG Soap to your body ONLY FROM THE NECK DOWN.   Do not use on face/ open                           Wound or open sores. Avoid contact with eyes, ears mouth and genitals (private parts).                       Wash face,  Genitals (private parts) with your normal soap.             6.  Wash thoroughly, paying special attention to the area where your surgery  will be performed.  7.  Thoroughly rinse your body with warm water from the neck down.  8.  DO NOT shower/wash with your normal soap after using and rinsing off  the CHG Soap.                9.  Pat yourself dry with a clean towel.            10.  Wear clean pajamas.            11.  Place clean sheets on your bed the night of your first shower and do not  sleep with pets. Day of Surgery : Do not apply any lotions/deodorants the morning of surgery.  Please wear clean clothes to the hospital/surgery center.  FAILURE TO FOLLOW THESE INSTRUCTIONS MAY RESULT IN THE CANCELLATION OF YOUR SURGERY PATIENT SIGNATURE_________________________________  NURSE SIGNATURE__________________________________  ________________________________________________________________________

## 2016-06-26 ENCOUNTER — Encounter (HOSPITAL_COMMUNITY)
Admission: RE | Admit: 2016-06-26 | Discharge: 2016-06-26 | Disposition: A | Discharge status: Home / Self Care | Payer: Medicare Other | Source: Ambulatory Visit | Attending: Surgery | Admitting: Surgery

## 2016-06-26 ENCOUNTER — Encounter (HOSPITAL_COMMUNITY): Payer: Self-pay

## 2016-06-26 ENCOUNTER — Encounter (INDEPENDENT_AMBULATORY_CARE_PROVIDER_SITE_OTHER): Payer: Self-pay

## 2016-06-26 DIAGNOSIS — Z01810 Encounter for preprocedural cardiovascular examination: Secondary | ICD-10-CM | POA: Insufficient documentation

## 2016-06-26 DIAGNOSIS — Z01812 Encounter for preprocedural laboratory examination: Secondary | ICD-10-CM | POA: Diagnosis not present

## 2016-06-26 DIAGNOSIS — Z905 Acquired absence of kidney: Secondary | ICD-10-CM | POA: Diagnosis not present

## 2016-06-26 DIAGNOSIS — M545 Low back pain: Secondary | ICD-10-CM | POA: Diagnosis not present

## 2016-06-26 DIAGNOSIS — E210 Primary hyperparathyroidism: Secondary | ICD-10-CM | POA: Insufficient documentation

## 2016-06-26 DIAGNOSIS — Z85528 Personal history of other malignant neoplasm of kidney: Secondary | ICD-10-CM | POA: Diagnosis not present

## 2016-06-26 DIAGNOSIS — M1990 Unspecified osteoarthritis, unspecified site: Secondary | ICD-10-CM | POA: Diagnosis not present

## 2016-06-26 DIAGNOSIS — M810 Age-related osteoporosis without current pathological fracture: Secondary | ICD-10-CM | POA: Insufficient documentation

## 2016-06-26 DIAGNOSIS — R262 Difficulty in walking, not elsewhere classified: Secondary | ICD-10-CM | POA: Diagnosis not present

## 2016-06-26 DIAGNOSIS — M25579 Pain in unspecified ankle and joints of unspecified foot: Secondary | ICD-10-CM | POA: Diagnosis not present

## 2016-06-26 DIAGNOSIS — I10 Essential (primary) hypertension: Secondary | ICD-10-CM | POA: Diagnosis not present

## 2016-06-26 HISTORY — DX: Gastro-esophageal reflux disease without esophagitis: K21.9

## 2016-06-26 HISTORY — DX: Essential (primary) hypertension: I10

## 2016-06-26 HISTORY — DX: Malignant (primary) neoplasm, unspecified: C80.1

## 2016-06-26 HISTORY — DX: Hyperparathyroidism, unspecified: E21.3

## 2016-06-26 HISTORY — DX: Restless legs syndrome: G25.81

## 2016-06-26 HISTORY — DX: Age-related osteoporosis without current pathological fracture: M81.0

## 2016-06-26 HISTORY — DX: Unspecified osteoarthritis, unspecified site: M19.90

## 2016-06-26 LAB — BASIC METABOLIC PANEL
ANION GAP: 5 (ref 5–15)
BUN: 32 mg/dL — ABNORMAL HIGH (ref 6–20)
CHLORIDE: 108 mmol/L (ref 101–111)
CO2: 26 mmol/L (ref 22–32)
Calcium: 11.9 mg/dL — ABNORMAL HIGH (ref 8.9–10.3)
Creatinine, Ser: 1.53 mg/dL — ABNORMAL HIGH (ref 0.44–1.00)
GFR calc Af Amer: 38 mL/min — ABNORMAL LOW (ref >60)
GFR calc non Af Amer: 33 mL/min — ABNORMAL LOW (ref >60)
GLUCOSE: 94 mg/dL (ref 65–99)
Potassium: 5 mmol/L (ref 3.5–5.1)
Sodium: 139 mmol/L (ref 135–145)

## 2016-06-26 LAB — CBC
HCT: 33.6 % — ABNORMAL LOW (ref 36.0–46.0)
HEMOGLOBIN: 10.6 g/dL — AB (ref 12.0–15.0)
MCH: 30.1 pg (ref 26.0–34.0)
MCHC: 31.5 g/dL (ref 30.0–36.0)
MCV: 95.5 fL (ref 78.0–100.0)
Platelets: 358 10*3/uL (ref 150–400)
RBC: 3.52 MIL/uL — AB (ref 3.87–5.11)
RDW: 15.1 % (ref 11.5–15.5)
WBC: 10.2 10*3/uL (ref 4.0–10.5)

## 2016-06-26 NOTE — Patient Instructions (Signed)
ANAROSA KUBISIAK  06/26/2016   Your procedure is scheduled on: 06-28-16  Report to [facility] Main  Entrance take [facility]  elevators to 3rd floor to  [place] at 630  AM.  Call this number if you have problems the morning of surgery [phone]   Remember: ONLY 1 PERSON MAY GO WITH YOU TO SHORT STAY TO GET  READY MORNING OF [place].  Do not eat food or drink liquids :After Midnight.     Take these medicines the morning of surgery with A SIP OF WATER: AMLODIPINE (NORVASC)                               You may not have any metal on your body including hair pins and              piercings  Do not wear jewelry, make-up, lotions, powders or perfumes, deodorant             Do not wear nail polish.  Do not shave  48 hours prior to surgery.              Men may shave face and neck.   Do not bring valuables to the hospital. [place].  Contacts, dentures or bridgework may not be worn into surgery.  Leave suitcase in the car. After surgery it may be brought to your room.     Patients discharged the day of surgery will not be allowed to drive home.  Name and phone number of your driver:SPOUSE Herbie [place] CELL [phone]  Special Instructions: N/A              Please read over the following fact sheets you were given: _____________________________________________________________________             [facility] - Preparing for Surgery Before surgery, you can play an important role.  Because skin is not sterile, your skin needs to be as free of germs as possible.  You can reduce the number of germs on your skin by washing with CHG (chlorahexidine gluconate) soap before surgery.  CHG is an antiseptic cleaner which kills germs and bonds with the skin to continue killing germs even after washing. Please DO NOT use if you have an allergy to CHG or antibacterial soaps.  If your skin becomes reddened/irritated stop  using the CHG and inform your nurse when you arrive at Short Stay. Do not shave (including legs and underarms) for at least 48 hours prior to the first CHG shower.  You may shave your face/neck. Please follow these instructions carefully:  1.  Shower with CHG Soap the night before surgery and the  morning of Surgery.  2.  If you choose to wash your hair, wash your hair first as usual with your  normal  shampoo.  3.  After you shampoo, rinse your hair and body thoroughly to remove the  shampoo.                           4.  Use CHG as you would any other liquid soap.  You can apply chg directly  to the skin and wash  Gently with a scrungie or clean washcloth.  5.  Apply the CHG Soap to your body ONLY FROM THE NECK DOWN.   Do not use on face/ open                           Wound or open sores. Avoid contact with eyes, ears mouth and genitals (private parts).                       Wash face,  Genitals (private parts) with your normal soap.             6.  Wash thoroughly, paying special attention to the area where your surgery  will be performed.  7.  Thoroughly rinse your body with warm water from the neck down.  8.  DO NOT shower/wash with your normal soap after using and rinsing off  the CHG Soap.                9.  Pat yourself dry with a clean towel.            10.  Wear clean pajamas.            11.  Place clean sheets on your bed the night of your first shower and do not  sleep with pets. Day of Surgery : Do not apply any lotions/deodorants the morning of surgery.  Please wear clean clothes to the hospital/surgery center.  FAILURE TO FOLLOW THESE INSTRUCTIONS MAY RESULT IN THE CANCELLATION OF YOUR SURGERY PATIENT SIGNATURE_________________________________  NURSE SIGNATURE__________________________________  ________________________________________________________________________

## 2016-06-27 ENCOUNTER — Inpatient Hospital Stay (HOSPITAL_COMMUNITY)
Admission: RE | Admit: 2016-06-27 | Discharge: 2016-06-27 | Disposition: A | Discharge status: Home / Self Care | Payer: Self-pay | Source: Ambulatory Visit

## 2016-06-27 NOTE — Progress Notes (Signed)
BMP results routed to Dr Armandina Gemma in basket

## 2016-06-28 ENCOUNTER — Encounter (HOSPITAL_COMMUNITY): Admission: RE | Payer: Self-pay | Source: Ambulatory Visit

## 2016-06-28 ENCOUNTER — Ambulatory Visit (HOSPITAL_COMMUNITY): Admission: RE | Admit: 2016-06-28 | Payer: Medicare Other | Source: Ambulatory Visit | Admitting: Surgery

## 2016-06-28 SURGERY — PARATHYROIDECTOMY
Anesthesia: General | Laterality: Left

## 2016-06-28 MED ORDER — LIDOCAINE 2% (20 MG/ML) 5 ML SYRINGE
INTRAMUSCULAR | Status: AC
  Filled 2016-06-28: qty 5

## 2016-06-28 MED ORDER — FENTANYL CITRATE (PF) 100 MCG/2ML IJ SOLN
INTRAMUSCULAR | Status: AC
  Filled 2016-06-28: qty 2

## 2016-06-28 MED ORDER — SUCCINYLCHOLINE CHLORIDE 200 MG/10ML IV SOSY
PREFILLED_SYRINGE | INTRAVENOUS | Status: AC
  Filled 2016-06-28: qty 10

## 2016-06-28 MED ORDER — PROPOFOL 10 MG/ML IV BOLUS
INTRAVENOUS | Status: AC
  Filled 2016-06-28: qty 20

## 2016-06-28 MED ORDER — ROCURONIUM BROMIDE 50 MG/5ML IV SOSY
PREFILLED_SYRINGE | INTRAVENOUS | Status: AC
  Filled 2016-06-28: qty 5

## 2016-07-01 ENCOUNTER — Encounter (HOSPITAL_COMMUNITY): Payer: Self-pay | Admitting: Surgery

## 2016-07-02 ENCOUNTER — Ambulatory Visit (HOSPITAL_COMMUNITY): Payer: Medicare Other | Admitting: Anesthesiology

## 2016-07-02 ENCOUNTER — Encounter (HOSPITAL_COMMUNITY)
Admission: RE | Disposition: A | Discharge status: Home / Self Care | Payer: Self-pay | Source: Ambulatory Visit | Attending: Surgery

## 2016-07-02 ENCOUNTER — Ambulatory Visit (HOSPITAL_COMMUNITY)
Admission: RE | Admit: 2016-07-02 | Discharge: 2016-07-02 | Disposition: A | Discharge status: Home / Self Care | Payer: Medicare Other | Source: Ambulatory Visit | Attending: Surgery | Admitting: Surgery

## 2016-07-02 ENCOUNTER — Encounter (HOSPITAL_COMMUNITY): Payer: Self-pay | Admitting: *Deleted

## 2016-07-02 DIAGNOSIS — Z87891 Personal history of nicotine dependence: Secondary | ICD-10-CM | POA: Diagnosis not present

## 2016-07-02 DIAGNOSIS — E210 Primary hyperparathyroidism: Secondary | ICD-10-CM | POA: Diagnosis not present

## 2016-07-02 DIAGNOSIS — Z833 Family history of diabetes mellitus: Secondary | ICD-10-CM | POA: Diagnosis not present

## 2016-07-02 DIAGNOSIS — N189 Chronic kidney disease, unspecified: Secondary | ICD-10-CM | POA: Diagnosis not present

## 2016-07-02 DIAGNOSIS — Z9842 Cataract extraction status, left eye: Secondary | ICD-10-CM | POA: Diagnosis not present

## 2016-07-02 DIAGNOSIS — M549 Dorsalgia, unspecified: Secondary | ICD-10-CM | POA: Insufficient documentation

## 2016-07-02 DIAGNOSIS — Z8261 Family history of arthritis: Secondary | ICD-10-CM | POA: Diagnosis not present

## 2016-07-02 DIAGNOSIS — Z836 Family history of other diseases of the respiratory system: Secondary | ICD-10-CM | POA: Insufficient documentation

## 2016-07-02 DIAGNOSIS — I10 Essential (primary) hypertension: Secondary | ICD-10-CM | POA: Diagnosis not present

## 2016-07-02 DIAGNOSIS — E7800 Pure hypercholesterolemia, unspecified: Secondary | ICD-10-CM | POA: Diagnosis not present

## 2016-07-02 DIAGNOSIS — Z7982 Long term (current) use of aspirin: Secondary | ICD-10-CM | POA: Diagnosis not present

## 2016-07-02 DIAGNOSIS — Z809 Family history of malignant neoplasm, unspecified: Secondary | ICD-10-CM | POA: Diagnosis not present

## 2016-07-02 DIAGNOSIS — M1990 Unspecified osteoarthritis, unspecified site: Secondary | ICD-10-CM | POA: Insufficient documentation

## 2016-07-02 DIAGNOSIS — Z905 Acquired absence of kidney: Secondary | ICD-10-CM | POA: Diagnosis not present

## 2016-07-02 DIAGNOSIS — D351 Benign neoplasm of parathyroid gland: Secondary | ICD-10-CM | POA: Insufficient documentation

## 2016-07-02 DIAGNOSIS — Z8042 Family history of malignant neoplasm of prostate: Secondary | ICD-10-CM | POA: Insufficient documentation

## 2016-07-02 DIAGNOSIS — Z79899 Other long term (current) drug therapy: Secondary | ICD-10-CM | POA: Diagnosis not present

## 2016-07-02 DIAGNOSIS — Z85528 Personal history of other malignant neoplasm of kidney: Secondary | ICD-10-CM | POA: Diagnosis not present

## 2016-07-02 HISTORY — PX: PARATHYROIDECTOMY: SHX19

## 2016-07-02 SURGERY — PARATHYROIDECTOMY
Anesthesia: General | Laterality: Left

## 2016-07-02 MED ORDER — DEXAMETHASONE SODIUM PHOSPHATE 10 MG/ML IJ SOLN
INTRAMUSCULAR | Status: DC | PRN
  Administered 2016-07-02: 10 mg via INTRAVENOUS

## 2016-07-02 MED ORDER — CHLORHEXIDINE GLUCONATE CLOTH 2 % EX PADS: 6.0000 | MEDICATED_PAD | Freq: Once | CUTANEOUS | Status: DC

## 2016-07-02 MED ORDER — ROCURONIUM BROMIDE 50 MG/5ML IV SOSY
PREFILLED_SYRINGE | INTRAVENOUS | Status: AC
  Filled 2016-07-02: qty 5

## 2016-07-02 MED ORDER — ONDANSETRON HCL 4 MG/2ML IJ SOLN
INTRAMUSCULAR | Status: DC | PRN
  Administered 2016-07-02: 4 mg via INTRAVENOUS

## 2016-07-02 MED ORDER — ONDANSETRON HCL 4 MG/2ML IJ SOLN
INTRAMUSCULAR | Status: AC
  Filled 2016-07-02: qty 2

## 2016-07-02 MED ORDER — HYDROMORPHONE HCL 1 MG/ML IJ SOLN
INTRAMUSCULAR | Status: AC
  Administered 2016-07-02: 0.25 mg via INTRAVENOUS
  Filled 2016-07-02: qty 1

## 2016-07-02 MED ORDER — CEFAZOLIN SODIUM-DEXTROSE 2-4 GM/100ML-% IV SOLN
2.0000 g | INTRAVENOUS | Status: AC
  Administered 2016-07-02: 2 g via INTRAVENOUS
  Filled 2016-07-02: qty 100

## 2016-07-02 MED ORDER — CEFAZOLIN SODIUM-DEXTROSE 2-4 GM/100ML-% IV SOLN
INTRAVENOUS | Status: AC
  Filled 2016-07-02: qty 100

## 2016-07-02 MED ORDER — SUGAMMADEX SODIUM 200 MG/2ML IV SOLN
INTRAVENOUS | Status: DC | PRN
  Administered 2016-07-02: 150 mg via INTRAVENOUS

## 2016-07-02 MED ORDER — FENTANYL CITRATE (PF) 100 MCG/2ML IJ SOLN
INTRAMUSCULAR | Status: AC
  Filled 2016-07-02: qty 2

## 2016-07-02 MED ORDER — LIDOCAINE 2% (20 MG/ML) 5 ML SYRINGE
INTRAMUSCULAR | Status: AC
  Filled 2016-07-02: qty 5

## 2016-07-02 MED ORDER — BUPIVACAINE HCL (PF) 0.25 % IJ SOLN
INTRAMUSCULAR | Status: AC
  Filled 2016-07-02: qty 30

## 2016-07-02 MED ORDER — ROCURONIUM BROMIDE 100 MG/10ML IV SOLN
INTRAVENOUS | Status: DC | PRN
  Administered 2016-07-02: 40 mg via INTRAVENOUS

## 2016-07-02 MED ORDER — DEXAMETHASONE SODIUM PHOSPHATE 10 MG/ML IJ SOLN
INTRAMUSCULAR | Status: AC
  Filled 2016-07-02: qty 1

## 2016-07-02 MED ORDER — LIDOCAINE HCL (CARDIAC) 20 MG/ML IV SOLN
INTRAVENOUS | Status: DC | PRN
Start: 2016-07-02 — End: 2016-07-02
  Administered 2016-07-02: 100 mg via INTRAVENOUS

## 2016-07-02 MED ORDER — SODIUM CHLORIDE 0.9 % IR SOLN
Status: DC | PRN
  Administered 2016-07-02: 1000 mL

## 2016-07-02 MED ORDER — HYDROMORPHONE HCL 1 MG/ML IJ SOLN
0.2500 mg | INTRAMUSCULAR | Status: DC | PRN
  Administered 2016-07-02: 0.5 mg via INTRAVENOUS
  Administered 2016-07-02: 0.25 mg via INTRAVENOUS
  Administered 2016-07-02: 0.5 mg via INTRAVENOUS
  Administered 2016-07-02: 0.25 mg via INTRAVENOUS

## 2016-07-02 MED ORDER — BUPIVACAINE HCL (PF) 0.25 % IJ SOLN
INTRAMUSCULAR | Status: DC | PRN
  Administered 2016-07-02: 10 mL

## 2016-07-02 MED ORDER — SUGAMMADEX SODIUM 200 MG/2ML IV SOLN
INTRAVENOUS | Status: AC
  Filled 2016-07-02: qty 2

## 2016-07-02 MED ORDER — PROPOFOL 10 MG/ML IV BOLUS
INTRAVENOUS | Status: AC
  Filled 2016-07-02: qty 20

## 2016-07-02 MED ORDER — FENTANYL CITRATE (PF) 100 MCG/2ML IJ SOLN
INTRAMUSCULAR | Status: DC | PRN
  Administered 2016-07-02: 75 ug via INTRAVENOUS

## 2016-07-02 MED ORDER — PROPOFOL 10 MG/ML IV BOLUS
INTRAVENOUS | Status: DC | PRN
  Administered 2016-07-02: 150 mg via INTRAVENOUS

## 2016-07-02 MED ORDER — LACTATED RINGERS IV SOLN
INTRAVENOUS | Status: DC
  Administered 2016-07-02: 1000 mL via INTRAVENOUS
  Administered 2016-07-02: 09:00:00 via INTRAVENOUS

## 2016-07-02 MED ORDER — HYDROCODONE-ACETAMINOPHEN 5-325 MG PO TABS: 1.0000 | ORAL_TABLET | ORAL | 0 refills | Status: DC | PRN

## 2016-07-02 MED ORDER — HYDROMORPHONE HCL 1 MG/ML IJ SOLN
INTRAMUSCULAR | Status: AC
  Administered 2016-07-02: 0.5 mg via INTRAVENOUS
  Filled 2016-07-02: qty 1

## 2016-07-02 SURGICAL SUPPLY — 43 items
ATTRACTOMAT 16X20 MAGNETIC DRP (DRAPES) ×3 IMPLANT
BENZOIN TINCTURE PRP APPL 2/3 (GAUZE/BANDAGES/DRESSINGS) IMPLANT
BLADE HEX COATED 2.75 (ELECTRODE) ×3 IMPLANT
BLADE SURG 15 STRL LF DISP TIS (BLADE) ×1 IMPLANT
BLADE SURG 15 STRL SS (BLADE) ×2
CHLORAPREP W/TINT 26ML (MISCELLANEOUS) ×3 IMPLANT
CLIP TI MEDIUM 6 (CLIP) ×3 IMPLANT
CLIP TI WIDE RED SMALL 6 (CLIP) ×3 IMPLANT
CLOSURE WOUND 1/2 X4 (GAUZE/BANDAGES/DRESSINGS) ×1
COVER SURGICAL LIGHT HANDLE (MISCELLANEOUS) IMPLANT
DERMABOND ADVANCED (GAUZE/BANDAGES/DRESSINGS)
DERMABOND ADVANCED .7 DNX12 (GAUZE/BANDAGES/DRESSINGS) IMPLANT
DRAPE LAPAROTOMY T 98X78 PEDS (DRAPES) ×3 IMPLANT
DRESSING SURGICEL FIBRLLR 1X2 (HEMOSTASIS) ×1 IMPLANT
DRSG SURGICEL FIBRILLAR 1X2 (HEMOSTASIS) ×3
ELECT PENCIL ROCKER SW 15FT (MISCELLANEOUS) ×3 IMPLANT
ELECT REM PT RETURN 9FT ADLT (ELECTROSURGICAL) ×3
ELECTRODE REM PT RTRN 9FT ADLT (ELECTROSURGICAL) ×1 IMPLANT
GAUZE SPONGE 2X2 8PLY STRL LF (GAUZE/BANDAGES/DRESSINGS) ×1 IMPLANT
GAUZE SPONGE 4X4 16PLY XRAY LF (GAUZE/BANDAGES/DRESSINGS) ×3 IMPLANT
GLOVE SURG ORTHO 8.0 STRL STRW (GLOVE) ×3 IMPLANT
GOWN STRL REUS W/TWL XL LVL3 (GOWN DISPOSABLE) ×9 IMPLANT
HEMOSTAT SURGICEL 2X4 FIBR (HEMOSTASIS) IMPLANT
ILLUMINATOR WAVEGUIDE N/F (MISCELLANEOUS) ×3 IMPLANT
KIT BASIN OR (CUSTOM PROCEDURE TRAY) ×3 IMPLANT
LIGHT WAVEGUIDE WIDE FLAT (MISCELLANEOUS) IMPLANT
NEEDLE HYPO 22GX1.5 SAFETY (NEEDLE) ×3 IMPLANT
NEEDLE HYPO 25X1 1.5 SAFETY (NEEDLE) IMPLANT
PACK BASIC VI WITH GOWN DISP (CUSTOM PROCEDURE TRAY) ×3 IMPLANT
SPONGE GAUZE 2X2 STER 10/PKG (GAUZE/BANDAGES/DRESSINGS) ×2
STAPLER VISISTAT 35W (STAPLE) IMPLANT
STRIP CLOSURE SKIN 1/2X4 (GAUZE/BANDAGES/DRESSINGS) ×2 IMPLANT
SUT MNCRL AB 4-0 PS2 18 (SUTURE) ×3 IMPLANT
SUT SILK 2 0 (SUTURE)
SUT SILK 2-0 18XBRD TIE 12 (SUTURE) IMPLANT
SUT SILK 3 0 (SUTURE)
SUT SILK 3-0 18XBRD TIE 12 (SUTURE) IMPLANT
SUT VIC AB 3-0 SH 18 (SUTURE) ×3 IMPLANT
SYR BULB IRRIGATION 50ML (SYRINGE) ×3 IMPLANT
SYR CONTROL 10ML LL (SYRINGE) ×3 IMPLANT
TOWEL OR 17X26 10 PK STRL BLUE (TOWEL DISPOSABLE) ×3 IMPLANT
TOWEL OR NON WOVEN STRL DISP B (DISPOSABLE) ×3 IMPLANT
YANKAUER SUCT BULB TIP 10FT TU (MISCELLANEOUS) ×3 IMPLANT

## 2016-07-02 NOTE — Anesthesia Procedure Notes (Signed)
Procedure Name: Intubation Date/Time: 07/02/2016 9:33 AM Performed by: Glory Buff Pre-anesthesia Checklist: Patient identified, Emergency Drugs available, Suction available and Patient being monitored Patient Re-evaluated:Patient Re-evaluated prior to inductionOxygen Delivery Method: Circle system utilized Preoxygenation: Pre-oxygenation with 100% oxygen Intubation Type: IV induction Ventilation: Mask ventilation without difficulty Laryngoscope Size: Miller and 3 Grade View: Grade I Tube type: Oral Tube size: 7.0 mm Number of attempts: 1 Airway Equipment and Method: Stylet and Oral airway Placement Confirmation: ETT inserted through vocal cords under direct vision,  positive ETCO2 and breath sounds checked- equal and bilateral Secured at: 20 cm Tube secured with: Tape Dental Injury: Teeth and Oropharynx as per pre-operative assessment

## 2016-07-02 NOTE — Op Note (Signed)
OPERATIVE REPORT - PARATHYROIDECTOMY  Preoperative diagnosis: Primary hyperparathyroidism  Postop diagnosis: Same  Procedure: left inferior minimally invasive parathyroidectomy  Surgeon:  Earnstine Regal, MD, FACS  Anesthesia: Gen. endotracheal  Estimated blood loss: Minimal  Preparation: ChloraPrep  Indications: The patient is a 76 year old female who presents with a parathyroid neoplasm.  Patient is referred by Dr. 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 Patient was referred to nephrology for evaluation of chronic kidney disease. 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 She also has osteoporosis. She denies any history of nephrolithiasis. She does note chronic fatigue as well as bone and joint pain. Laboratory studies showed an elevated serum calcium level of 12.4. Intact PTH level is reportedly elevated.  CT scan at [facility] demonstrates an 11 mm nodule below the left thyroid lobe consistent with parathyroid adenoma.  Procedure: Patient was prepared in the holding area. He was brought to operating room and placed in a supine position on the operating room table. Following administration of general anesthesia, the patient was positioned and then prepped and draped in the usual strict aseptic fashion. After ascertaining that an adequate level of anesthesia been achieved, a neck incision was made with a #15 blade. Dissection was carried through subcutaneous tissues and platysma. Hemostasis was obtained with the electrocautery. Skin flaps were developed circumferentially and a Weitlander retractor was placed for exposure.  Strap muscles were incised in the midline. Strap muscles were reflected exposing the thyroid lobe. With gentle blunt dissection the thyroid lobe was mobilized.  Dissection was carried through adipose tissue and an enlarged parathyroid gland was identified. It was gently mobilized. Vascular  structures were divided between small and medium ligaclips. Care was taken to avoid the recurrent laryngeal nerve and the esophagus. The parathyroid gland was completely excised. It was submitted to pathology where frozen section confirmed parathyroid tissue consistent with adenoma.  Neck was irrigated with warm saline and good hemostasis was noted. Fibrillar was placed in the operative field. Strap muscles were reapproximated in the midline with interrupted 3-0 Vicryl sutures. Platysma was closed with interrupted 3-0 Vicryl sutures. Skin was closed with a running 4-0 Monocryl subcuticular suture. Marcaine was infiltrated circumferentially. Wound was washed and dried and Dermabond was applied. Patient was awakened from anesthesia and brought to the recovery room. The patient tolerated the procedure well.   Earnstine Regal, MD, [facility]

## 2016-07-02 NOTE — Discharge Instructions (Signed)

## 2016-07-02 NOTE — Transfer of Care (Signed)
Immediate Anesthesia Transfer of Care Note  Patient: Alexandra [name]  Procedure(s) Performed: Procedure(s): LEFT INFERIOR PARATHYROIDECTOMY (Left)  Patient Location: PACU  Anesthesia Type:General  Level of Consciousness: awake, alert  and oriented  Airway & Oxygen Therapy: Patient Spontanous Breathing and Patient connected to face mask oxygen  Post-op Assessment: Report given to RN and Post -op Vital signs reviewed and stable  Post vital signs: Reviewed and stable  Last Vitals:  Vitals:   07/02/16 0716  BP: (!) 154/69  Pulse: 88  Resp: 16  Temp: 36.4 C    Last Pain:  Vitals:   07/02/16 0716  TempSrc: Oral         Complications: No apparent anesthesia complications

## 2016-07-02 NOTE — Anesthesia Preprocedure Evaluation (Addendum)
Anesthesia Evaluation  Patient identified by MRN, date of birth, ID band Patient awake    Reviewed: Allergy & Precautions, NPO status , Patient's Chart, lab work & pertinent test results  Airway Mallampati: II       Dental   Pulmonary former smoker,    breath sounds clear to auscultation       Cardiovascular hypertension,  Rhythm:Regular Rate:Normal     Neuro/Psych    GI/Hepatic   Endo/Other    Renal/GU      Musculoskeletal   Abdominal   Peds  Hematology   Anesthesia Other Findings   Reproductive/Obstetrics                             Anesthesia Physical Anesthesia Plan  ASA: II  Anesthesia Plan: General   Post-op Pain Management:    Induction: Intravenous  Airway Management Planned: Oral ETT  Additional Equipment:   Intra-op Plan:   Post-operative Plan: Extubation in OR  Informed Consent: I have reviewed the patients History and Physical, chart, labs and discussed the procedure including the risks, benefits and alternatives for the proposed anesthesia with the patient or authorized representative who has indicated his/her understanding and acceptance.   Dental advisory given  Plan Discussed with:   Anesthesia Plan Comments:         Anesthesia Quick Evaluation

## 2016-07-02 NOTE — H&P (View-Only) (Signed)
General Surgery [facility]  Lenn Cal. Inglett DOB: 02-13-1941 Married / Language: English / Race: White Female  History of Present Illness   The patient is a 76 year old female who presents with a parathyroid neoplasm.  Patient is referred by Dr. 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 Patient was referred to nephrology for evaluation of chronic kidney disease. 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 She also has osteoporosis. She denies any history of nephrolithiasis. She does note chronic fatigue as well as bone and joint pain. Laboratory studies showed an elevated serum calcium level of 12.4. Intact PTH level is reportedly elevated but I do not have those laboratory records. Patient was started on Sensipar approximately 2 months ago. She has not had any imaging studies performed. Patient denies any family history of parathyroid disease or other endocrine neoplasm. Patient has had no prior head or neck surgery.   Other Problems Arthritis  Back Pain  Cancer  High blood pressure  Hypercholesterolemia   Past Surgical History  Cataract Surgery  Left. Nephrectomy  Left. Spinal Surgery - Lower Back   Diagnostic Studies History Colonoscopy  1-5 years ago  Allergies  No Known Drug Allergies 05/15/2016  Medication History  Furosemide (20MG  Tablet, Oral daily) Active. Gabapentin (300MG  Capsule, Oral daily) Active. Sensipar (30MG  Tablet, Oral two times daily) Active. Ventolin HFA (108 (90 Base)MCG/ACT Aerosol Soln, Inhalation as needed) Active. Aspirin (81MG  Tablet Chewable, Oral daily) Active. Medications Reconciled  Social History  Caffeine use  Coffee. No alcohol use  No drug use  Tobacco use  Former smoker.  Family History  Arthritis  Brother, Mother, Sister. Cancer  Sister. Diabetes Mellitus  Brother. Prostate Cancer  Brother. Respiratory  Condition  Mother. Thyroid problems  Sister.   Review of Systems General Present- Fatigue. Not Present- Appetite Loss, Chills, Fever, Night Sweats, Weight Gain and Weight Loss. Skin Not Present- Change in Wart/Mole, Dryness, Hives, Jaundice, New Lesions, Non-Healing Wounds, Rash and Ulcer. HEENT Present- Wears glasses/contact lenses. Not Present- Earache, Hearing Loss, Hoarseness, Nose Bleed, Oral Ulcers, Ringing in the Ears, Seasonal Allergies, Sinus Pain, Sore Throat, Visual Disturbances and Yellow Eyes. Respiratory Present- Difficulty Breathing and Snoring. Not Present- Bloody sputum, Chronic Cough and Wheezing. Breast Not Present- Breast Mass, Breast Pain, Nipple Discharge and Skin Changes. Cardiovascular Present- Leg Cramps and Swelling of Extremities. Not Present- Chest Pain, Difficulty Breathing Lying Down, Palpitations, Rapid Heart Rate and Shortness of Breath. Gastrointestinal Present- Bloating, Excessive gas and Gets full quickly at meals. Not Present- Abdominal Pain, Bloody Stool, Change in Bowel Habits, Chronic diarrhea, Constipation, Difficulty Swallowing, Hemorrhoids, Indigestion, Nausea, Rectal Pain and Vomiting. Musculoskeletal Present- Back Pain, Joint Pain, Joint Stiffness, Muscle Pain, Muscle Weakness and Swelling of Extremities. Neurological Present- Weakness. Not Present- Decreased Memory, Fainting, Headaches, Numbness, Seizures, Tingling, Tremor and Trouble walking. Psychiatric Present- Change in Sleep Pattern. Not Present- Anxiety, Bipolar, Depression, Fearful and Frequent crying. Endocrine Present- Cold Intolerance. Not Present- Excessive Hunger, Hair Changes, Heat Intolerance, Hot flashes and New Diabetes. Hematology Present- Easy Bruising. Not Present- Blood Thinners, Excessive bleeding, Gland problems, HIV and Persistent Infections.  Vitals  Weight: 150.6 lb Height: 64in Body Surface Area: 1.73 m Body Mass Index: 25.85 kg/m  Temp.: 98.93F  Pulse: 68  (Regular)  BP: 122/78 (Sitting, Left Arm, Standard)   Physical Exam The physical exam findings are as follows: Note:General - appears comfortable, no distress; not diaphorectic  HEENT - normocephalic; sclerae clear, gaze conjugate; mucous  membranes moist, dentition good; voice normal  Neck - symmetric on extension; no palpable anterior or posterior cervical adenopathy; no palpable masses in the thyroid bed  Chest - clear bilaterally without rhonchi, rales, or wheeze  Cor - regular rhythm with normal rate; no significant murmur  Ext - non-tender without significant edema or lymphedema  Neuro - grossly intact; no tremor    Assessment & Plan  HYPERCALCEMIA (E83.52)  Pt Education - Pamphlet Given - The Parathyroid Surgery Book: discussed with patient and provided information. Follow Up - Call [facility] office after tests / studies done to discuss further plans  Patient is referred for evaluation of hypercalcemia and suspected primary hyperparathyroidism. Patient is given written literature on parathyroid surgery to review at home.  I have recommended repeating her laboratory studies including a calcium level, intact PTH level, and 25-hydroxy vitamin D level. We will obtain those labs today. Patient will then be scheduled for a nuclear medicine parathyroid scan in the near future in hopes of confirming the diagnosis and localizing a parathyroid adenoma. If the sestamibi scan is unrevealing, then we will obtain a high resolution ultrasound. If the ultrasound does not identify an adenoma, we will consider a 4D CT scan of the neck.  I will contact the patient with the results of her studies. She and I discussed minimally invasive parathyroidectomy today. We discussed the location of the surgical incision. We discussed performing this procedure as an outpatient surgery. We discussed the postoperative recovery. She understands. Patient wishes to proceed with further evaluation.  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  Plan minimally invasive parathyroidectomy.  The risks and benefits of the procedure have been discussed at length with the patient.  The patient understands the proposed procedure, potential alternative treatments, and the course of recovery to be expected.  All of the patient's questions have been answered at this time.  The patient wishes to proceed with surgery.  Earnstine Regal, MD, [facility] Office: [phone]

## 2016-07-02 NOTE — Anesthesia Postprocedure Evaluation (Signed)
Anesthesia Post Note  Patient: Alexandra [name]  Procedure(s) Performed: Procedure(s) (LRB): LEFT INFERIOR PARATHYROIDECTOMY (Left)  Patient location during evaluation: PACU Anesthesia Type: General Level of consciousness: awake Pain management: pain level controlled Vital Signs Assessment: post-procedure vital signs reviewed and stable Cardiovascular status: stable Anesthetic complications: no       Last Vitals:  Vitals:   07/02/16 1142 07/02/16 1219  BP: (!) 145/77 (!) 145/70  Pulse: 88 87  Resp: 18 16  Temp: 37 C 36.9 C    Last Pain:  Vitals:   07/02/16 1219  TempSrc: Oral  PainSc:                  [name]

## 2016-07-02 NOTE — Interval H&P Note (Signed)
History and Physical Interval Note:  07/02/2016 9:19 AM  Alexandra [name]  has presented today for surgery, with the diagnosis of PRIMARY HYPERPARATHYROIDISM.  The various methods of treatment have been discussed with the patient and family. After consideration of risks, benefits and other options for treatment, the patient has consented to    Procedure(s): LEFT INFERIOR PARATHYROIDECTOMY (Left) as a surgical intervention .    The patient's history has been reviewed, patient examined, no change in status, stable for surgery.  I have reviewed the patient's chart and labs.  Questions were answered to the patient's satisfaction.    Earnstine Regal, MD, [facility] Office: [place]

## 2016-07-03 ENCOUNTER — Encounter (HOSPITAL_COMMUNITY): Payer: Self-pay | Admitting: Surgery

## 2016-07-11 DIAGNOSIS — M79672 Pain in left foot: Secondary | ICD-10-CM | POA: Diagnosis not present

## 2016-07-11 DIAGNOSIS — R531 Weakness: Secondary | ICD-10-CM | POA: Diagnosis not present

## 2016-07-11 DIAGNOSIS — M7989 Other specified soft tissue disorders: Secondary | ICD-10-CM | POA: Diagnosis not present

## 2016-07-19 DIAGNOSIS — J449 Chronic obstructive pulmonary disease, unspecified: Secondary | ICD-10-CM | POA: Diagnosis not present

## 2016-07-19 DIAGNOSIS — I10 Essential (primary) hypertension: Secondary | ICD-10-CM | POA: Diagnosis not present

## 2016-07-19 DIAGNOSIS — E210 Primary hyperparathyroidism: Secondary | ICD-10-CM | POA: Diagnosis not present

## 2016-07-19 DIAGNOSIS — E782 Mixed hyperlipidemia: Secondary | ICD-10-CM | POA: Diagnosis not present

## 2016-07-24 DIAGNOSIS — M545 Low back pain: Secondary | ICD-10-CM | POA: Diagnosis not present

## 2016-07-24 DIAGNOSIS — M25579 Pain in unspecified ankle and joints of unspecified foot: Secondary | ICD-10-CM | POA: Diagnosis not present

## 2016-07-27 DIAGNOSIS — M545 Low back pain: Secondary | ICD-10-CM | POA: Diagnosis not present

## 2016-07-27 DIAGNOSIS — M25579 Pain in unspecified ankle and joints of unspecified foot: Secondary | ICD-10-CM | POA: Diagnosis not present

## 2016-08-01 DIAGNOSIS — M25579 Pain in unspecified ankle and joints of unspecified foot: Secondary | ICD-10-CM | POA: Diagnosis not present

## 2016-08-01 DIAGNOSIS — M545 Low back pain: Secondary | ICD-10-CM | POA: Diagnosis not present

## 2016-08-01 DIAGNOSIS — M25571 Pain in right ankle and joints of right foot: Secondary | ICD-10-CM | POA: Diagnosis not present

## 2016-08-01 DIAGNOSIS — M47816 Spondylosis without myelopathy or radiculopathy, lumbar region: Secondary | ICD-10-CM | POA: Diagnosis not present

## 2016-08-01 DIAGNOSIS — M5416 Radiculopathy, lumbar region: Secondary | ICD-10-CM | POA: Diagnosis not present

## 2016-08-03 DIAGNOSIS — M545 Low back pain: Secondary | ICD-10-CM | POA: Diagnosis not present

## 2016-08-03 DIAGNOSIS — M25579 Pain in unspecified ankle and joints of unspecified foot: Secondary | ICD-10-CM | POA: Diagnosis not present

## 2016-08-06 DIAGNOSIS — M545 Low back pain: Secondary | ICD-10-CM | POA: Diagnosis not present

## 2016-08-06 DIAGNOSIS — M25579 Pain in unspecified ankle and joints of unspecified foot: Secondary | ICD-10-CM | POA: Diagnosis not present

## 2016-08-08 DIAGNOSIS — M25579 Pain in unspecified ankle and joints of unspecified foot: Secondary | ICD-10-CM | POA: Diagnosis not present

## 2016-08-08 DIAGNOSIS — M545 Low back pain: Secondary | ICD-10-CM | POA: Diagnosis not present

## 2016-08-13 DIAGNOSIS — M545 Low back pain: Secondary | ICD-10-CM | POA: Diagnosis not present

## 2016-08-13 DIAGNOSIS — M25579 Pain in unspecified ankle and joints of unspecified foot: Secondary | ICD-10-CM | POA: Diagnosis not present

## 2016-08-16 DIAGNOSIS — M25579 Pain in unspecified ankle and joints of unspecified foot: Secondary | ICD-10-CM | POA: Diagnosis not present

## 2016-08-16 DIAGNOSIS — M545 Low back pain: Secondary | ICD-10-CM | POA: Diagnosis not present

## 2016-08-22 DIAGNOSIS — M545 Low back pain: Secondary | ICD-10-CM | POA: Diagnosis not present

## 2016-08-22 DIAGNOSIS — M25579 Pain in unspecified ankle and joints of unspecified foot: Secondary | ICD-10-CM | POA: Diagnosis not present

## 2016-08-23 DIAGNOSIS — H2511 Age-related nuclear cataract, right eye: Secondary | ICD-10-CM | POA: Diagnosis not present

## 2016-08-23 DIAGNOSIS — H26492 Other secondary cataract, left eye: Secondary | ICD-10-CM | POA: Diagnosis not present

## 2016-08-29 DIAGNOSIS — M545 Low back pain: Secondary | ICD-10-CM | POA: Diagnosis not present

## 2016-08-29 DIAGNOSIS — M25579 Pain in unspecified ankle and joints of unspecified foot: Secondary | ICD-10-CM | POA: Diagnosis not present

## 2016-08-30 DIAGNOSIS — S9031XA Contusion of right foot, initial encounter: Secondary | ICD-10-CM | POA: Diagnosis not present

## 2016-09-06 DIAGNOSIS — M545 Low back pain: Secondary | ICD-10-CM | POA: Diagnosis not present

## 2016-09-06 DIAGNOSIS — M25579 Pain in unspecified ankle and joints of unspecified foot: Secondary | ICD-10-CM | POA: Diagnosis not present

## 2016-09-17 DIAGNOSIS — S9031XA Contusion of right foot, initial encounter: Secondary | ICD-10-CM | POA: Diagnosis not present

## 2016-09-18 DIAGNOSIS — E663 Overweight: Secondary | ICD-10-CM | POA: Diagnosis not present

## 2016-09-18 DIAGNOSIS — G4709 Other insomnia: Secondary | ICD-10-CM | POA: Diagnosis not present

## 2016-09-18 DIAGNOSIS — G2581 Restless legs syndrome: Secondary | ICD-10-CM | POA: Diagnosis not present

## 2016-09-18 DIAGNOSIS — R600 Localized edema: Secondary | ICD-10-CM | POA: Diagnosis not present

## 2016-09-18 DIAGNOSIS — I10 Essential (primary) hypertension: Secondary | ICD-10-CM | POA: Diagnosis not present

## 2016-09-18 DIAGNOSIS — R400 Somnolence: Secondary | ICD-10-CM | POA: Diagnosis not present

## 2016-10-02 DIAGNOSIS — G4733 Obstructive sleep apnea (adult) (pediatric): Secondary | ICD-10-CM | POA: Diagnosis not present

## 2016-10-08 DIAGNOSIS — G2581 Restless legs syndrome: Secondary | ICD-10-CM | POA: Diagnosis not present

## 2016-10-08 DIAGNOSIS — E210 Primary hyperparathyroidism: Secondary | ICD-10-CM | POA: Diagnosis not present

## 2016-10-08 DIAGNOSIS — I10 Essential (primary) hypertension: Secondary | ICD-10-CM | POA: Diagnosis not present

## 2016-10-08 DIAGNOSIS — N183 Chronic kidney disease, stage 3 (moderate): Secondary | ICD-10-CM | POA: Diagnosis not present

## 2016-11-09 DIAGNOSIS — M47816 Spondylosis without myelopathy or radiculopathy, lumbar region: Secondary | ICD-10-CM | POA: Diagnosis not present

## 2016-11-09 DIAGNOSIS — M5416 Radiculopathy, lumbar region: Secondary | ICD-10-CM | POA: Diagnosis not present

## 2016-11-09 DIAGNOSIS — M533 Sacrococcygeal disorders, not elsewhere classified: Secondary | ICD-10-CM | POA: Diagnosis not present

## 2016-11-13 DIAGNOSIS — H40013 Open angle with borderline findings, low risk, bilateral: Secondary | ICD-10-CM | POA: Diagnosis not present

## 2016-11-13 DIAGNOSIS — H2511 Age-related nuclear cataract, right eye: Secondary | ICD-10-CM | POA: Diagnosis not present

## 2016-11-22 DIAGNOSIS — M5416 Radiculopathy, lumbar region: Secondary | ICD-10-CM | POA: Diagnosis not present

## 2016-11-22 DIAGNOSIS — M533 Sacrococcygeal disorders, not elsewhere classified: Secondary | ICD-10-CM | POA: Diagnosis not present

## 2016-11-22 DIAGNOSIS — M47816 Spondylosis without myelopathy or radiculopathy, lumbar region: Secondary | ICD-10-CM | POA: Diagnosis not present

## 2016-11-23 DIAGNOSIS — J449 Chronic obstructive pulmonary disease, unspecified: Secondary | ICD-10-CM | POA: Diagnosis not present

## 2016-11-23 DIAGNOSIS — I10 Essential (primary) hypertension: Secondary | ICD-10-CM | POA: Diagnosis not present

## 2016-11-23 DIAGNOSIS — G2581 Restless legs syndrome: Secondary | ICD-10-CM | POA: Diagnosis not present

## 2016-11-23 DIAGNOSIS — E782 Mixed hyperlipidemia: Secondary | ICD-10-CM | POA: Diagnosis not present

## 2016-11-26 DIAGNOSIS — E875 Hyperkalemia: Secondary | ICD-10-CM | POA: Diagnosis not present

## 2016-12-04 DIAGNOSIS — G2581 Restless legs syndrome: Secondary | ICD-10-CM | POA: Diagnosis not present

## 2016-12-04 DIAGNOSIS — Z79899 Other long term (current) drug therapy: Secondary | ICD-10-CM | POA: Diagnosis not present

## 2016-12-04 DIAGNOSIS — H259 Unspecified age-related cataract: Secondary | ICD-10-CM | POA: Diagnosis not present

## 2016-12-04 DIAGNOSIS — Z7982 Long term (current) use of aspirin: Secondary | ICD-10-CM | POA: Diagnosis not present

## 2016-12-04 DIAGNOSIS — H2511 Age-related nuclear cataract, right eye: Secondary | ICD-10-CM | POA: Diagnosis not present

## 2016-12-04 DIAGNOSIS — I10 Essential (primary) hypertension: Secondary | ICD-10-CM | POA: Diagnosis not present

## 2016-12-10 DIAGNOSIS — E876 Hypokalemia: Secondary | ICD-10-CM | POA: Diagnosis not present

## 2017-01-02 DIAGNOSIS — H524 Presbyopia: Secondary | ICD-10-CM | POA: Diagnosis not present

## 2017-02-12 DIAGNOSIS — M533 Sacrococcygeal disorders, not elsewhere classified: Secondary | ICD-10-CM | POA: Diagnosis not present

## 2017-02-12 DIAGNOSIS — M47816 Spondylosis without myelopathy or radiculopathy, lumbar region: Secondary | ICD-10-CM | POA: Diagnosis not present

## 2017-02-12 DIAGNOSIS — M5416 Radiculopathy, lumbar region: Secondary | ICD-10-CM | POA: Diagnosis not present

## 2017-03-01 DIAGNOSIS — M533 Sacrococcygeal disorders, not elsewhere classified: Secondary | ICD-10-CM | POA: Diagnosis not present

## 2017-03-12 DIAGNOSIS — I10 Essential (primary) hypertension: Secondary | ICD-10-CM | POA: Diagnosis not present

## 2017-03-12 DIAGNOSIS — E210 Primary hyperparathyroidism: Secondary | ICD-10-CM | POA: Diagnosis not present

## 2017-03-12 DIAGNOSIS — N183 Chronic kidney disease, stage 3 (moderate): Secondary | ICD-10-CM | POA: Diagnosis not present

## 2017-03-19 DIAGNOSIS — M533 Sacrococcygeal disorders, not elsewhere classified: Secondary | ICD-10-CM | POA: Diagnosis not present

## 2017-03-19 DIAGNOSIS — M5416 Radiculopathy, lumbar region: Secondary | ICD-10-CM | POA: Diagnosis not present

## 2017-03-19 DIAGNOSIS — M47816 Spondylosis without myelopathy or radiculopathy, lumbar region: Secondary | ICD-10-CM | POA: Diagnosis not present

## 2017-03-27 DIAGNOSIS — J449 Chronic obstructive pulmonary disease, unspecified: Secondary | ICD-10-CM | POA: Diagnosis not present

## 2017-03-27 DIAGNOSIS — I70203 Unspecified atherosclerosis of native arteries of extremities, bilateral legs: Secondary | ICD-10-CM | POA: Diagnosis not present

## 2017-03-27 DIAGNOSIS — G2581 Restless legs syndrome: Secondary | ICD-10-CM | POA: Diagnosis not present

## 2017-03-27 DIAGNOSIS — E782 Mixed hyperlipidemia: Secondary | ICD-10-CM | POA: Diagnosis not present

## 2017-03-27 DIAGNOSIS — I10 Essential (primary) hypertension: Secondary | ICD-10-CM | POA: Diagnosis not present

## 2017-03-27 DIAGNOSIS — Z23 Encounter for immunization: Secondary | ICD-10-CM | POA: Diagnosis not present

## 2017-04-08 DIAGNOSIS — J441 Chronic obstructive pulmonary disease with (acute) exacerbation: Secondary | ICD-10-CM | POA: Diagnosis not present

## 2017-04-16 DIAGNOSIS — Z1231 Encounter for screening mammogram for malignant neoplasm of breast: Secondary | ICD-10-CM | POA: Diagnosis not present

## 2017-04-18 DIAGNOSIS — M533 Sacrococcygeal disorders, not elsewhere classified: Secondary | ICD-10-CM | POA: Diagnosis not present

## 2017-05-07 DIAGNOSIS — M533 Sacrococcygeal disorders, not elsewhere classified: Secondary | ICD-10-CM | POA: Diagnosis not present

## 2017-05-07 DIAGNOSIS — M5416 Radiculopathy, lumbar region: Secondary | ICD-10-CM | POA: Diagnosis not present

## 2017-05-07 DIAGNOSIS — Z981 Arthrodesis status: Secondary | ICD-10-CM | POA: Diagnosis not present

## 2017-05-07 DIAGNOSIS — M47816 Spondylosis without myelopathy or radiculopathy, lumbar region: Secondary | ICD-10-CM | POA: Diagnosis not present

## 2017-06-05 DIAGNOSIS — Z7901 Long term (current) use of anticoagulants: Secondary | ICD-10-CM | POA: Diagnosis not present

## 2017-06-05 DIAGNOSIS — Z1379 Encounter for other screening for genetic and chromosomal anomalies: Secondary | ICD-10-CM | POA: Diagnosis not present

## 2017-06-05 DIAGNOSIS — Z800 Family history of malignant neoplasm of digestive organs: Secondary | ICD-10-CM | POA: Diagnosis not present

## 2017-06-05 DIAGNOSIS — E785 Hyperlipidemia, unspecified: Secondary | ICD-10-CM | POA: Diagnosis not present

## 2017-06-05 DIAGNOSIS — Z808 Family history of malignant neoplasm of other organs or systems: Secondary | ICD-10-CM | POA: Diagnosis not present

## 2017-06-05 DIAGNOSIS — Z803 Family history of malignant neoplasm of breast: Secondary | ICD-10-CM | POA: Diagnosis not present

## 2017-06-05 DIAGNOSIS — I259 Chronic ischemic heart disease, unspecified: Secondary | ICD-10-CM | POA: Diagnosis not present

## 2017-06-05 DIAGNOSIS — Z7902 Long term (current) use of antithrombotics/antiplatelets: Secondary | ICD-10-CM | POA: Diagnosis not present

## 2017-06-05 DIAGNOSIS — Z809 Family history of malignant neoplasm, unspecified: Secondary | ICD-10-CM | POA: Diagnosis not present

## 2017-06-17 DIAGNOSIS — H26493 Other secondary cataract, bilateral: Secondary | ICD-10-CM | POA: Diagnosis not present

## 2017-06-17 DIAGNOSIS — H524 Presbyopia: Secondary | ICD-10-CM | POA: Diagnosis not present

## 2017-06-18 DIAGNOSIS — M47816 Spondylosis without myelopathy or radiculopathy, lumbar region: Secondary | ICD-10-CM | POA: Diagnosis not present

## 2017-06-18 DIAGNOSIS — M5416 Radiculopathy, lumbar region: Secondary | ICD-10-CM | POA: Diagnosis not present

## 2017-06-18 DIAGNOSIS — M533 Sacrococcygeal disorders, not elsewhere classified: Secondary | ICD-10-CM | POA: Diagnosis not present

## 2017-06-20 DIAGNOSIS — M533 Sacrococcygeal disorders, not elsewhere classified: Secondary | ICD-10-CM | POA: Diagnosis not present

## 2017-06-20 DIAGNOSIS — M109 Gout, unspecified: Secondary | ICD-10-CM | POA: Diagnosis not present

## 2017-06-20 DIAGNOSIS — I10 Essential (primary) hypertension: Secondary | ICD-10-CM | POA: Diagnosis not present

## 2017-06-20 DIAGNOSIS — M47816 Spondylosis without myelopathy or radiculopathy, lumbar region: Secondary | ICD-10-CM | POA: Diagnosis not present

## 2017-06-20 DIAGNOSIS — Z981 Arthrodesis status: Secondary | ICD-10-CM | POA: Diagnosis not present

## 2017-06-20 DIAGNOSIS — J449 Chronic obstructive pulmonary disease, unspecified: Secondary | ICD-10-CM | POA: Diagnosis not present

## 2017-06-20 DIAGNOSIS — M5416 Radiculopathy, lumbar region: Secondary | ICD-10-CM | POA: Diagnosis not present

## 2017-06-20 DIAGNOSIS — K219 Gastro-esophageal reflux disease without esophagitis: Secondary | ICD-10-CM | POA: Diagnosis not present

## 2017-06-20 DIAGNOSIS — E7800 Pure hypercholesterolemia, unspecified: Secondary | ICD-10-CM | POA: Diagnosis not present

## 2017-07-29 DIAGNOSIS — G2581 Restless legs syndrome: Secondary | ICD-10-CM | POA: Diagnosis not present

## 2017-07-29 DIAGNOSIS — E782 Mixed hyperlipidemia: Secondary | ICD-10-CM | POA: Diagnosis not present

## 2017-07-29 DIAGNOSIS — I70203 Unspecified atherosclerosis of native arteries of extremities, bilateral legs: Secondary | ICD-10-CM | POA: Diagnosis not present

## 2017-07-29 DIAGNOSIS — I10 Essential (primary) hypertension: Secondary | ICD-10-CM | POA: Diagnosis not present

## 2017-07-29 DIAGNOSIS — J449 Chronic obstructive pulmonary disease, unspecified: Secondary | ICD-10-CM | POA: Diagnosis not present

## 2017-08-01 DIAGNOSIS — J029 Acute pharyngitis, unspecified: Secondary | ICD-10-CM | POA: Diagnosis not present

## 2017-08-05 DIAGNOSIS — J0100 Acute maxillary sinusitis, unspecified: Secondary | ICD-10-CM | POA: Diagnosis not present

## 2017-08-05 DIAGNOSIS — E875 Hyperkalemia: Secondary | ICD-10-CM | POA: Diagnosis not present

## 2017-08-16 DIAGNOSIS — M47816 Spondylosis without myelopathy or radiculopathy, lumbar region: Secondary | ICD-10-CM | POA: Diagnosis not present

## 2017-08-16 DIAGNOSIS — M5416 Radiculopathy, lumbar region: Secondary | ICD-10-CM | POA: Diagnosis not present

## 2017-08-16 DIAGNOSIS — M533 Sacrococcygeal disorders, not elsewhere classified: Secondary | ICD-10-CM | POA: Diagnosis not present

## 2017-09-04 DIAGNOSIS — N3001 Acute cystitis with hematuria: Secondary | ICD-10-CM | POA: Diagnosis not present

## 2017-09-11 DIAGNOSIS — Z85528 Personal history of other malignant neoplasm of kidney: Secondary | ICD-10-CM | POA: Diagnosis not present

## 2017-09-11 DIAGNOSIS — E7800 Pure hypercholesterolemia, unspecified: Secondary | ICD-10-CM | POA: Diagnosis not present

## 2017-09-11 DIAGNOSIS — I129 Hypertensive chronic kidney disease with stage 1 through stage 4 chronic kidney disease, or unspecified chronic kidney disease: Secondary | ICD-10-CM | POA: Diagnosis not present

## 2017-09-11 DIAGNOSIS — E210 Primary hyperparathyroidism: Secondary | ICD-10-CM | POA: Diagnosis not present

## 2017-09-11 DIAGNOSIS — N183 Chronic kidney disease, stage 3 (moderate): Secondary | ICD-10-CM | POA: Diagnosis not present

## 2017-09-18 DIAGNOSIS — N3000 Acute cystitis without hematuria: Secondary | ICD-10-CM | POA: Diagnosis not present

## 2017-09-23 DIAGNOSIS — N183 Chronic kidney disease, stage 3 (moderate): Secondary | ICD-10-CM | POA: Diagnosis not present

## 2017-09-25 DIAGNOSIS — J441 Chronic obstructive pulmonary disease with (acute) exacerbation: Secondary | ICD-10-CM | POA: Diagnosis not present

## 2017-10-04 DIAGNOSIS — J018 Other acute sinusitis: Secondary | ICD-10-CM | POA: Diagnosis not present

## 2017-10-04 DIAGNOSIS — J180 Bronchopneumonia, unspecified organism: Secondary | ICD-10-CM | POA: Diagnosis not present

## 2017-10-22 DIAGNOSIS — J441 Chronic obstructive pulmonary disease with (acute) exacerbation: Secondary | ICD-10-CM | POA: Diagnosis not present

## 2017-11-29 DIAGNOSIS — N3946 Mixed incontinence: Secondary | ICD-10-CM | POA: Diagnosis not present

## 2017-11-29 DIAGNOSIS — G4709 Other insomnia: Secondary | ICD-10-CM | POA: Diagnosis not present

## 2017-11-29 DIAGNOSIS — E782 Mixed hyperlipidemia: Secondary | ICD-10-CM | POA: Diagnosis not present

## 2017-11-29 DIAGNOSIS — I10 Essential (primary) hypertension: Secondary | ICD-10-CM | POA: Diagnosis not present

## 2017-11-29 DIAGNOSIS — G2581 Restless legs syndrome: Secondary | ICD-10-CM | POA: Diagnosis not present

## 2017-11-29 DIAGNOSIS — J438 Other emphysema: Secondary | ICD-10-CM | POA: Diagnosis not present

## 2017-12-06 DIAGNOSIS — N3090 Cystitis, unspecified without hematuria: Secondary | ICD-10-CM | POA: Diagnosis not present

## 2017-12-06 DIAGNOSIS — R358 Other polyuria: Secondary | ICD-10-CM | POA: Diagnosis not present

## 2017-12-06 DIAGNOSIS — N3000 Acute cystitis without hematuria: Secondary | ICD-10-CM | POA: Diagnosis not present

## 2017-12-20 DIAGNOSIS — C642 Malignant neoplasm of left kidney, except renal pelvis: Secondary | ICD-10-CM | POA: Diagnosis not present

## 2017-12-20 DIAGNOSIS — N393 Stress incontinence (female) (male): Secondary | ICD-10-CM | POA: Diagnosis not present

## 2017-12-20 DIAGNOSIS — N3020 Other chronic cystitis without hematuria: Secondary | ICD-10-CM | POA: Diagnosis not present

## 2017-12-20 DIAGNOSIS — N318 Other neuromuscular dysfunction of bladder: Secondary | ICD-10-CM | POA: Diagnosis not present

## 2017-12-23 DIAGNOSIS — R600 Localized edema: Secondary | ICD-10-CM | POA: Diagnosis not present

## 2017-12-23 DIAGNOSIS — R0602 Shortness of breath: Secondary | ICD-10-CM | POA: Diagnosis not present

## 2018-01-08 ENCOUNTER — Ambulatory Visit (INDEPENDENT_AMBULATORY_CARE_PROVIDER_SITE_OTHER): Payer: Medicare Other | Admitting: Cardiology

## 2018-01-08 ENCOUNTER — Encounter: Payer: Self-pay | Admitting: Cardiology

## 2018-01-08 VITALS — BP 142/90 | HR 93 | Wt 159.8 lb

## 2018-01-08 DIAGNOSIS — R0609 Other forms of dyspnea: Secondary | ICD-10-CM | POA: Diagnosis not present

## 2018-01-08 DIAGNOSIS — Z87891 Personal history of nicotine dependence: Secondary | ICD-10-CM

## 2018-01-08 DIAGNOSIS — I10 Essential (primary) hypertension: Secondary | ICD-10-CM | POA: Diagnosis not present

## 2018-01-08 DIAGNOSIS — E782 Mixed hyperlipidemia: Secondary | ICD-10-CM

## 2018-01-08 NOTE — Progress Notes (Signed)
Cardiology Office Note:    Date:  01/08/2018   ID:  Alexandra [name], DOB [birth year], MRN [id number]  PCP:  [name], MD  Cardiologist:  Jenean Lindau, MD   Referring MD: [name],*    ASSESSMENT:    1. DOE (dyspnea on exertion)   2. Ex-smoker   3. Essential hypertension   4. Mixed hyperlipidemia    PLAN:    In order of problems listed above:  1. Primary prevention stressed with the patient.  Importance of compliance with diet and medication stressed and she vocalized understanding. 2. Her blood pressure is stable.  She has an element of whitecoat hypertension.  She checks it regularly at home.  Diet was discussed for dyslipidemia and weight reduction was also stressed.  Risks of obesity explained and she vocalized understanding.  Lipids are followed by her primary care physician and recently blood work was done and she was told that it was fine. 3. In view of her significant symptoms she will undergo Lexiscan sestamibi. 4. Also in view of her significant symptoms I will give her an appointment with the pulmonologist to assess her with test such as pulmonary function test to understand her lung status. 5. Patient will be seen in follow-up appointment in 6 months or earlier if the patient has any concerns    Medication Adjustments/Labs and Tests Ordered: Current medicines are reviewed at length with the patient today.  Concerns regarding medicines are outlined above.  No orders of the defined types were placed in this encounter.  No orders of the defined types were placed in this encounter.    History of Present Illness:    Alexandra [name] is a 77 y.o. female who is being seen today for the evaluation of shortness of breath on exertion at the request of [name],*.  Patient is a pleasant 77 year old female.  She has past medical history of essential hypertension, dyslipidemia and a long history of smoking.  She quit smoking 10 years  ago.  She mentions to me that she has been noticing increasing shortness of breath on exertion.  No chest pain orthopnea or PND.  She leads a sedentary lifestyle.  This is because of orthopedic issues involving her back.  At the time of my evaluation, the patient is alert awake oriented and in no distress.  Past Medical History:  Diagnosis Date  . Arthritis   . Cancer [facility]) 1999   left kidney removed  . GERD (gastroesophageal reflux disease)    hx of none recent  . Hyperparathyroidism ([place])   . Hypertension   . Osteoporosis   . Restless legs syndrome     Past Surgical History:  Procedure Laterality Date  . ABDOMINAL HYSTERECTOMY     complete  . BACK SURGERY  2016   lower L 4 to L 5  . bladder tack and uterus lift  10/10/2015  . left nephrectomy  1999  . PARATHYROIDECTOMY Left 07/02/2016   Procedure: LEFT INFERIOR PARATHYROIDECTOMY;  Surgeon: Armandina Gemma, MD;  Location: [facility];  Service: General;  Laterality: Left;    Current Medications: Current Meds  Medication Sig  . albuterol (PROVENTIL HFA;VENTOLIN HFA) 108 (90 Base) MCG/ACT inhaler Inhale 2 puffs into the lungs at bedtime.  Marland Kitchen amLODipine (NORVASC) 10 MG tablet Take 10 mg by mouth every morning.  Marland Kitchen aspirin EC 81 MG tablet Take 81 mg by mouth daily.  Marland Kitchen gabapentin (NEURONTIN) 300 MG capsule Take 300 mg by mouth at  bedtime.  Marland Kitchen HYDROcodone-acetaminophen (NORCO/VICODIN) 5-325 MG tablet Take 1-2 tablets by mouth every 4 (four) hours as needed for moderate pain.  . polyethylene glycol (MIRALAX / GLYCOLAX) packet Take 17 g by mouth daily as needed for mild constipation.  Marland Kitchen rOPINIRole (REQUIP) 2 MG tablet Take 2 mg by mouth at bedtime.  . simvastatin (ZOCOR) 40 MG tablet Take 40 mg by mouth at bedtime.      Allergies:   Contrast media [iodinated diagnostic agents]   Social History   Socioeconomic History  . Marital status: Married    Spouse name: Not on file  . Number of children: Not on file  . Years of education: Not on  file  . Highest education level: Not on file  Occupational History  . Not on file  Social Needs  . Financial resource strain: Not on file  . Food insecurity:    Worry: Not on file    Inability: Not on file  . Transportation needs:    Medical: Not on file    Non-medical: Not on file  Tobacco Use  . Smoking status: Former Smoker    Packs/day: 1.00    Years: 50.00    Pack years: 50.00    Types: Cigarettes    Last attempt to quit: 06/11/2005    Years since quitting: 12.5  . Smokeless tobacco: Never Used  Substance and Sexual Activity  . Alcohol use: No  . Drug use: No  . Sexual activity: Not on file  Lifestyle  . Physical activity:    Days per week: Not on file    Minutes per session: Not on file  . Stress: Not on file  Relationships  . Social connections:    Talks on phone: Not on file    Gets together: Not on file    Attends religious service: Not on file    Active member of club or organization: Not on file    Attends meetings of clubs or organizations: Not on file    Relationship status: Not on file  Other Topics Concern  . Not on file  Social History Narrative  . Not on file     Family History: The patient's family history is not on file.  ROS:   Please see the history of present illness.    All other systems reviewed and are negative.  EKGs/Labs/Other Studies Reviewed:    The following studies were reviewed today: EKG reveals sinus rhythm with nonspecific ST changes.   Recent Labs: No results found for requested labs within last 8760 hours.  Recent Lipid Panel No results found for: CHOL, TRIG, HDL, CHOLHDL, VLDL, LDLCALC, LDLDIRECT  Physical Exam:    VS:  BP (!) 142/90 (BP Location: Right Arm, Patient Position: Sitting, Cuff Size: Normal)   Pulse 93   Wt 159 lb 12.8 oz (72.5 kg)   SpO2 98%   BMI 27.43 kg/m     Wt Readings from Last 3 Encounters:  01/08/18 159 lb 12.8 oz (72.5 kg)  07/02/16 154 lb (69.9 kg)  06/26/16 154 lb 12.8 oz (70.2 kg)      GEN: Patient is in no acute distress HEENT: Normal NECK: No JVD; No carotid bruits LYMPHATICS: No lymphadenopathy CARDIAC: S1 S2 regular, 2/6 systolic murmur at the apex. RESPIRATORY:  Clear to auscultation without rales, wheezing or rhonchi  ABDOMEN: Soft, non-tender, non-distended MUSCULOSKELETAL:  No edema; No deformity  SKIN: Warm and dry NEUROLOGIC:  Alert and oriented x 3 PSYCHIATRIC:  Normal affect  Signed, Jenean Lindau, MD  01/08/2018 2:18 PM    [place]

## 2018-01-08 NOTE — Patient Instructions (Addendum)
Medication Instructions:  Your physician recommends that you continue on your current medications as directed. Please refer to the Current Medication list given to you today.   Labwork: None.  Testing/Procedures: Your physician has requested that you have an echocardiogram. Echocardiography is a painless test that uses sound waves to create images of your heart. It provides your doctor with information about the size and shape of your heart and how well your heart's chambers and valves are working. This procedure takes approximately one hour. There are no restrictions for this procedure.  Your physician has requested that you have a lexiscan myoview. For further information please visit [email or web address]. Please follow instruction sheet, as given.  [facility] [address] [place], [place] [address] [phone]  Lexiscan testing instructions:  Please present to [facility] 15 minutes earlier than your appointment time to allow for registration.  You will be called with an appointment date and time by our office once it has been scheduled; please allow at least 48 hours for [place] to contact you.  No food or drink after midnight prior to your test (except for small sips of water with your medications).   Bring a medication list or all your medications with you the morning of the testing.  No caffeine, decaffeinated or chocolate products 12 hours prior to the testing.  Please be aware that the test can take up to 3-4 hours.  Should you have any problem with the appointment date or time, please call [phone].   Please call the office with any further questions or concerns.     Follow-Up: Your physician wants you to follow-up in: 6 months. You will receive a reminder letter in the mail two months in advance. If you don't receive a letter, please call our office to schedule the follow-up appointment.   Any Other Special Instructions Will Be Listed  Below (If Applicable).    Referral Orders     Ambulatory referral to Pulmonology. Their office should you within one week, please call us if you do not hear from them.         If you need a refill on your cardiac medications before your next appointment, please call your pharmacy.

## 2018-01-08 NOTE — Addendum Note (Signed)
Addended by: Ashok Norris on: 01/08/2018 02:41 PM   Modules accepted: Orders

## 2018-01-14 DIAGNOSIS — R0600 Dyspnea, unspecified: Secondary | ICD-10-CM | POA: Diagnosis not present

## 2018-01-14 DIAGNOSIS — R0609 Other forms of dyspnea: Secondary | ICD-10-CM | POA: Diagnosis not present

## 2018-01-15 ENCOUNTER — Telehealth: Payer: Self-pay

## 2018-01-15 NOTE — Telephone Encounter (Signed)
Patient aware of normal lexiscan myoview results at [facility] per Dr Geraldo Pitter.  Patient verbalized understanding.

## 2018-01-17 ENCOUNTER — Telehealth: Payer: Self-pay

## 2018-01-17 ENCOUNTER — Other Ambulatory Visit: Payer: Self-pay

## 2018-01-17 DIAGNOSIS — R0609 Other forms of dyspnea: Principal | ICD-10-CM

## 2018-01-17 DIAGNOSIS — I10 Essential (primary) hypertension: Secondary | ICD-10-CM

## 2018-01-17 NOTE — Telephone Encounter (Signed)
Informed patient of lexi results; informed to call the office with any further questions or concerns.

## 2018-02-17 ENCOUNTER — Other Ambulatory Visit: Payer: Medicare Other

## 2018-02-21 ENCOUNTER — Ambulatory Visit (INDEPENDENT_AMBULATORY_CARE_PROVIDER_SITE_OTHER): Payer: Medicare Other

## 2018-02-21 DIAGNOSIS — I10 Essential (primary) hypertension: Secondary | ICD-10-CM

## 2018-02-21 DIAGNOSIS — R0609 Other forms of dyspnea: Secondary | ICD-10-CM

## 2018-02-21 NOTE — Progress Notes (Signed)
Complete echocardiogram has been performed.  Jimmy Kaysee Hergert RDCS 

## 2018-02-27 ENCOUNTER — Encounter: Payer: Self-pay | Admitting: Pulmonary Disease

## 2018-02-27 ENCOUNTER — Ambulatory Visit (HOSPITAL_BASED_OUTPATIENT_CLINIC_OR_DEPARTMENT_OTHER)
Admission: RE | Admit: 2018-02-27 | Discharge: 2018-02-27 | Disposition: A | Discharge status: Home / Self Care | Payer: Medicare Other | Source: Ambulatory Visit | Attending: Pulmonary Disease | Admitting: Pulmonary Disease

## 2018-02-27 ENCOUNTER — Other Ambulatory Visit (INDEPENDENT_AMBULATORY_CARE_PROVIDER_SITE_OTHER): Payer: Medicare Other

## 2018-02-27 ENCOUNTER — Ambulatory Visit (INDEPENDENT_AMBULATORY_CARE_PROVIDER_SITE_OTHER): Payer: Medicare Other | Admitting: Pulmonary Disease

## 2018-02-27 VITALS — BP 112/72 | HR 95 | Ht 64.0 in | Wt 156.0 lb

## 2018-02-27 DIAGNOSIS — I700 Atherosclerosis of aorta: Secondary | ICD-10-CM | POA: Diagnosis not present

## 2018-02-27 DIAGNOSIS — R0602 Shortness of breath: Secondary | ICD-10-CM | POA: Diagnosis not present

## 2018-02-27 DIAGNOSIS — R0609 Other forms of dyspnea: Secondary | ICD-10-CM

## 2018-02-27 NOTE — Progress Notes (Signed)
   Subjective:    Patient ID: Alexandra [name], female    DOB: June 19, 1940, 77 y.o.   MRN: [id number]  HPI    Review of Systems  Constitutional: Negative for fever and unexpected weight change.  HENT: Positive for sneezing. Negative for congestion, dental problem, ear pain, nosebleeds, postnasal drip, rhinorrhea, sinus pressure, sore throat and trouble swallowing.   Eyes: Negative for redness and itching.  Respiratory: Positive for cough and shortness of breath. Negative for chest tightness and wheezing.   Cardiovascular: Positive for leg swelling. Negative for palpitations.  Gastrointestinal: Positive for nausea and vomiting.  Genitourinary: Negative for dysuria.  Musculoskeletal: Positive for joint swelling.  Skin: Negative for rash.  Allergic/Immunologic: Positive for food allergies. Negative for environmental allergies and immunocompromised state.  Neurological: Negative for headaches.  Hematological: Bruises/bleeds easily.  Psychiatric/Behavioral: Negative for dysphoric mood. The patient is nervous/anxious.        Objective:   Physical Exam        Assessment & Plan:

## 2018-02-27 NOTE — Progress Notes (Signed)
[facility]  Chief Complaint  Patient presents with  . pulm consult    Pt referred by [facility]. Pt has SOB with exertion, dry cough, and denies chest tightness.    Constitutional: BP 112/72 (BP Location: Left Arm, Cuff Size: Normal)   Pulse 95   Ht 5\' 4"  (1.626 m)   Wt 156 lb (70.8 kg)   SpO2 97%   BMI 26.78 kg/m   History of Present Illness: Alexandra [name] is a 77 y.o. female former smoker with shortness of breath.  She had pneumonia in 2011.  She was oxygen for 2 years after this.  She had breathing test then, but doesn't remember what this showed.  She has never been told she has asthma or emphysema.  Her mother had emphysema.  She smoked 1 to 1.5 ppd and quit about 20 yrs ago.  She had nephrectomy for renal cancer.  She does okay with her breathing on level ground.  She gets winded walking up hills or stairs.  Her daughter says she wheezes when this happens.  She doesn't have chest tightness, but feels like the air can't get into her lungs enough.  She uses albuterol intermittently when she is short of breath, and helps some.  She gets sinus congestion and sneezing.  She has episodes of nausea and vomits sometimes.  She doesn't have much of an appetite.  She wears compression stockings to help with leg swelling.  She is not aware of having lung or leg clots.  No exposure to TB.  No animal/bird exposures.  She has trouble around strong smells.   Comprehensive Respiratory Exam:  Appearance - well kempt  ENMT - nasal mucosa moist, turbinates clear, deviated nasal septum, wears dentures, no gingival bleeding, no oral exudates, no tonsillar hypertrophy Neck - no masses, trachea midline, no thyromegaly, no elevation in JVP Respiratory - normal appearance of chest wall, normal respiratory effort w/o accessory muscle use, no dullness on percussion, no wheezing or rales CV - s1s2 regular rate and rhythm, no murmurs, 1+ peripheral edema,  radial pulses symmetric GI - soft, non tender, no masses, no hepatosplenomegaly Lymph - no adenopathy noted in neck and axillary areas MSK - normal muscle strength and tone, normal gait Ext - no cyanosis, clubbing, or joint inflammation noted Skin - no rashes, lesions, or ulcers Neuro - oriented to person, place, and time Psych - normal mood and affect  Discussion: She has prior history of smoking and family history of emphysema.  She has progressive dyspnea with exertion that started after she was treated for pneumonia in 2011.  She has symptoms suggestive or allergy and irritant induced rhinitis.  She had recent cardiac assessment that was unrevealing.  Assessment/Plan:  Dyspnea on exertion. - will arrange for CMET, CBC with diff, CXR, and PFT - can continue prn albuterol for now - further interventions to be determine based on test findings  Nausea with intermittent vomiting. - she has history of reflux - advised her to discuss with her PCP  Leg swelling. - she is on amlodipine - defer to primary care/cardiology about whether amlodipine should be changed to see if leg swelling gets better    Patient Instructions  Chest xray and lab tests today Will schedule pulmonary function test Follow up in 2 weeks with Dr. Halford Chessman or Nurse Practitioner    Chesley Mires, MD [place] 02/27/2018, 11:41 AM  Flow Sheet  Pulmonary tests:  Cardiac tests: Echo  02/21/18 >> EF 55 to 60%  Review of Systems: Constitutional: Negative for fever and unexpected weight change.  HENT: Positive for sneezing. Negative for congestion, dental problem, ear pain, nosebleeds, postnasal drip, rhinorrhea, sinus pressure, sore throat and trouble swallowing.   Eyes: Negative for redness and itching.  Respiratory: Positive for cough and shortness of breath. Negative for chest tightness and wheezing.   Cardiovascular: Positive for leg swelling. Negative for palpitations.  Gastrointestinal:  Positive for nausea and vomiting.  Genitourinary: Negative for dysuria.  Musculoskeletal: Positive for joint swelling.  Skin: Negative for rash.  Allergic/Immunologic: Positive for food allergies. Negative for environmental allergies and immunocompromised state.  Neurological: Negative for headaches.  Hematological: Bruises/bleeds easily.  Psychiatric/Behavioral: Negative for dysphoric mood. The patient is nervous/anxious.    Past Medical History: She  has a past medical history of Arthritis, Cancer ([place]) (1999), GERD (gastroesophageal reflux disease), Hyperparathyroidism ([place]), Hypertension, Osteoporosis, and Restless legs syndrome.  Past Surgical History: She  has a past surgical history that includes left nephrectomy (1999); Abdominal hysterectomy; Back surgery (2016); bladder tack and uterus lift (10/10/2015); and Parathyroidectomy (Left, 07/02/2016).  Family History: Her family history includes Emphysema in her mother.  Social History: She  reports that she quit smoking about 12 years ago. Her smoking use included cigarettes. She has a 50.00 pack-year smoking history. She has never used smokeless tobacco. She reports that she does not drink alcohol or use drugs.  Medications: Allergies as of 02/27/2018      Reactions   Contrast Media [iodinated Diagnostic Agents] Hives, Itching, Rash   Shellfish Allergy Nausea And Vomiting   Shellfish-derived Products Nausea And Vomiting      Medication List        Accurate as of 02/27/18 11:41 AM. Always use your most recent med list.          albuterol 108 (90 Base) MCG/ACT inhaler Commonly known as:  PROVENTIL HFA;VENTOLIN HFA Inhale 2 puffs into the lungs at bedtime.   amLODipine 10 MG tablet Commonly known as:  NORVASC Take 10 mg by mouth every morning.   gabapentin 300 MG capsule Commonly known as:  NEURONTIN Take 300 mg by mouth at bedtime.   HYDROcodone-acetaminophen 5-325 MG tablet Commonly known as:  NORCO/VICODIN Take  1-2 tablets by mouth every 4 (four) hours as needed for moderate pain.   rOPINIRole 2 MG tablet Commonly known as:  REQUIP Take 2 mg by mouth at bedtime.   simvastatin 40 MG tablet Commonly known as:  ZOCOR Take 40 mg by mouth at bedtime.

## 2018-02-27 NOTE — Patient Instructions (Signed)
Chest xray and lab tests today Will schedule pulmonary function test Follow up in 2 weeks with Dr. Halford Chessman or Nurse Practitioner

## 2018-02-28 ENCOUNTER — Telehealth: Payer: Self-pay | Admitting: Pulmonary Disease

## 2018-02-28 LAB — CBC WITH DIFFERENTIAL
Basophils Absolute: 0.1 10*3/uL (ref 0.0–0.2)
Basos: 1 %
EOS (ABSOLUTE): 0.1 10*3/uL (ref 0.0–0.4)
Eos: 1 %
Hematocrit: 40.7 % (ref 34.0–46.6)
Hemoglobin: 13.4 g/dL (ref 11.1–15.9)
Immature Grans (Abs): 0.2 10*3/uL — ABNORMAL HIGH (ref 0.0–0.1)
Immature Granulocytes: 1 %
LYMPHS ABS: 3.1 10*3/uL (ref 0.7–3.1)
LYMPHS: 23 %
MCH: 30.2 pg (ref 26.6–33.0)
MCHC: 32.9 g/dL (ref 31.5–35.7)
MCV: 92 fL (ref 79–97)
Monocytes Absolute: 0.9 10*3/uL (ref 0.1–0.9)
Monocytes: 7 %
NEUTROS ABS: 9.4 10*3/uL — AB (ref 1.4–7.0)
NEUTROS PCT: 67 %
RBC: 4.44 x10E6/uL (ref 3.77–5.28)
RDW: 12.7 % (ref 12.3–15.4)
WBC: 13.8 10*3/uL — ABNORMAL HIGH (ref 3.4–10.8)

## 2018-02-28 LAB — COMPREHENSIVE METABOLIC PANEL
ALK PHOS: 93 IU/L (ref 39–117)
ALT: 18 IU/L (ref 0–32)
AST: 21 IU/L (ref 0–40)
Albumin/Globulin Ratio: 1.7 (ref 1.2–2.2)
Albumin: 4.3 g/dL (ref 3.5–4.8)
BILIRUBIN TOTAL: 0.2 mg/dL (ref 0.0–1.2)
BUN/Creatinine Ratio: 17 (ref 12–28)
BUN: 30 mg/dL — AB (ref 8–27)
CO2: 21 mmol/L (ref 20–29)
CREATININE: 1.78 mg/dL — AB (ref 0.57–1.00)
Calcium: 9.5 mg/dL (ref 8.7–10.3)
Chloride: 103 mmol/L (ref 96–106)
GFR calc non Af Amer: 28 mL/min/{1.73_m2} — ABNORMAL LOW (ref >59)
GFR, EST AFRICAN AMERICAN: 32 mL/min/{1.73_m2} — AB (ref >59)
GLUCOSE: 78 mg/dL (ref 65–99)
Globulin, Total: 2.6 g/dL (ref 1.5–4.5)
Potassium: 5.2 mmol/L (ref 3.5–5.2)
SODIUM: 144 mmol/L (ref 134–144)
Total Protein: 6.9 g/dL (ref 6.0–8.5)

## 2018-02-28 NOTE — Telephone Encounter (Signed)
Called and schedule patient for appt. Nothing further needed at this time.

## 2018-03-04 ENCOUNTER — Telehealth: Payer: Self-pay | Admitting: Pulmonary Disease

## 2018-03-04 NOTE — Telephone Encounter (Signed)
Dg Chest 2 View  Result Date: 02/27/2018 CLINICAL DATA:  Chronic shortness of breath EXAM: CHEST - 2 VIEW COMPARISON:  None. FINDINGS: Atherosclerotic calcification of the aortic arch. Mild thoracic spondylosis. The lungs appear clear.  Cardiac and mediastinal contours normal. No pleural effusion identified. IMPRESSION: 1.  No active cardiopulmonary disease is radiographically apparent. 2.  Aortic Atherosclerosis (ICD10-I70.0). Electronically Signed   By: Van Clines M.D.   On: 02/27/2018 16:48    CMP Latest Ref Rng & Units 02/27/2018 06/26/2016  Glucose 65 - 99 mg/dL 78 94  BUN 8 - 27 mg/dL 30(H) 32(H)  Creatinine 0.57 - 1.00 mg/dL 1.78(H) 1.53(H)  Sodium 134 - 144 mmol/L 144 139  Potassium 3.5 - 5.2 mmol/L 5.2 5.0  Chloride 96 - 106 mmol/L 103 108  CO2 20 - 29 mmol/L 21 26  Calcium 8.7 - 10.3 mg/dL 9.5 11.9(H)  Total Protein 6.0 - 8.5 g/dL 6.9 -  Total Bilirubin 0.0 - 1.2 mg/dL 0.2 -  Alkaline Phos 39 - 117 IU/L 93 -  AST 0 - 40 IU/L 21 -  ALT 0 - 32 IU/L 18 -    CBC Latest Ref Rng & Units 02/27/2018 06/26/2016  WBC 3.4 - 10.8 x10E3/uL 13.8(H) 10.2  Hemoglobin 11.1 - 15.9 g/dL 13.4 10.6(L)  Hematocrit 34.0 - 46.6 % 40.7 33.6(L)  Platelets 150 - 400 K/uL - 358    Please let her know CXR and labs were okay.  Chesley Mires, MD [facility] Pulmonary/Critical Care 03/04/2018, 12:02 PM

## 2018-03-04 NOTE — Telephone Encounter (Signed)
Tried to LVM, busy signal twice. WCB X1

## 2018-03-05 NOTE — Telephone Encounter (Signed)
Tried to LVM, busy signal twice. WCB X2

## 2018-03-10 NOTE — Telephone Encounter (Signed)
Called and spoke with patient regarding results.  Informed the patient of results and recommendations today. Pt verbalized understanding and denied any questions or concerns at this time.  Nothing further needed.  

## 2018-03-19 ENCOUNTER — Encounter: Payer: Self-pay | Admitting: Primary Care

## 2018-03-19 ENCOUNTER — Ambulatory Visit (INDEPENDENT_AMBULATORY_CARE_PROVIDER_SITE_OTHER): Payer: Medicare Other | Admitting: Primary Care

## 2018-03-19 ENCOUNTER — Ambulatory Visit (INDEPENDENT_AMBULATORY_CARE_PROVIDER_SITE_OTHER): Payer: Medicare Other | Admitting: Pulmonary Disease

## 2018-03-19 DIAGNOSIS — R0609 Other forms of dyspnea: Secondary | ICD-10-CM

## 2018-03-19 DIAGNOSIS — J449 Chronic obstructive pulmonary disease, unspecified: Secondary | ICD-10-CM | POA: Diagnosis not present

## 2018-03-19 LAB — PULMONARY FUNCTION TEST
DL/VA % PRED: 77 %
DL/VA: 3.51 ml/min/mmHg/L
DLCO COR: 12.03 ml/min/mmHg
DLCO cor % pred: 55 %
DLCO unc % pred: 55 %
DLCO unc: 12.03 ml/min/mmHg
FEF 25-75 Post: 0.61 L/sec
FEF 25-75 Pre: 0.59 L/sec
FEF2575-%Change-Post: 2 %
FEF2575-%PRED-POST: 39 %
FEF2575-%PRED-PRE: 38 %
FEV1-%CHANGE-POST: 5 %
FEV1-%PRED-PRE: 59 %
FEV1-%Pred-Post: 63 %
FEV1-Post: 1.21 L
FEV1-Pre: 1.14 L
FEV1FVC-%Change-Post: 3 %
FEV1FVC-%Pred-Pre: 78 %
FEV6-%Change-Post: 2 %
FEV6-%PRED-PRE: 77 %
FEV6-%Pred-Post: 79 %
FEV6-PRE: 1.88 L
FEV6-Post: 1.94 L
FEV6FVC-%Change-Post: 1 %
FEV6FVC-%PRED-PRE: 103 %
FEV6FVC-%Pred-Post: 104 %
FVC-%Change-Post: 1 %
FVC-%PRED-POST: 76 %
FVC-%PRED-PRE: 75 %
FVC-POST: 1.97 L
FVC-PRE: 1.93 L
POST FEV1/FVC RATIO: 61 %
POST FEV6/FVC RATIO: 100 %
Pre FEV1/FVC ratio: 59 %
Pre FEV6/FVC Ratio: 98 %
RV % PRED: 151 %
RV: 3.31 L
TLC % pred: 109 %
TLC: 5.18 L

## 2018-03-19 MED ORDER — UMECLIDINIUM-VILANTEROL 62.5-25 MCG/INH IN AEPB: 1.0000 | INHALATION_SPRAY | Freq: Every day | RESPIRATORY_TRACT | 0 refills | Status: DC

## 2018-03-19 NOTE — Progress Notes (Signed)
PFT done today. 

## 2018-03-19 NOTE — Patient Instructions (Addendum)
Testing:  Pulmonary function testing showed obstruction. Dx COPD  RX: Plan A= Automatic - Anoro, take 1 puff daily (take everyday no matter who you feel) Plan B= Backup - Albuterol rescue inhaler, 2 puffs every 4-6 hrs as needed for shortness of breath/wheezing  Follow-up: In 4-6 weeks with Dr. Halford Chessman or Eustaquio Maize NP      Chronic Obstructive Pulmonary Disease Chronic obstructive pulmonary disease (COPD) is a long-term (chronic) lung problem. When you have COPD, it is hard for air to get in and out of your lungs. The way your lungs work will never return to normal. Usually the condition gets worse over time. There are things you can do to keep yourself as healthy as possible. Your doctor may treat your condition with:  Medicines.  Quitting smoking, if you smoke.  Rehabilitation. This may involve a team of specialists.  Oxygen.  Exercise and changes to your diet.  Lung surgery.  Comfort measures (palliative care).  Follow these instructions at home: Medicines  Take over-the-counter and prescription medicines only as told by your doctor.  Talk to your doctor before taking any cough or allergy medicines. You may need to avoid medicines that cause your lungs to be dry. Lifestyle  If you smoke, stop. Smoking makes the problem worse. If you need help quitting, ask your doctor.  Avoid being around things that make your breathing worse. This may include smoke, chemicals, and fumes.  Stay active, but remember to also rest.  Learn and use tips on how to relax.  Make sure you get enough sleep. Most adults need at least 7 hours a night.  Eat healthy foods. Eat smaller meals more often. Rest before meals. Controlled breathing  Learn and use tips on how to control your breathing as told by your doctor. Try: ? Breathing in (inhaling) through your nose for 1 second. Then, pucker your lips and breath out (exhale) through your lips for 2 seconds. ? Putting one hand on your belly (abdomen).  Breathe in slowly through your nose for 1 second. Your hand on your belly should move out. Pucker your lips and breathe out slowly through your lips. Your hand on your belly should move in as you breathe out. Controlled coughing  Learn and use controlled coughing to clear mucus from your lungs. The steps are: 1. Lean your head a little forward. 2. Breathe in deeply. 3. Try to hold your breath for 3 seconds. 4. Keep your mouth slightly open while coughing 2 times. 5. Spit any mucus out into a tissue. 6. Rest and do the steps again 1 or 2 times as needed. General instructions  Make sure you get all the shots (vaccines) that your doctor recommends. Ask your doctor about a flu shot and a pneumonia shot.  Use oxygen therapy and therapy to help improve your lungs (pulmonary rehabilitation) if told by your doctor. If you need home oxygen therapy, ask your doctor if you should buy a tool to measure your oxygen level (oximeter).  Make a COPD action plan with your doctor. This helps you know what to do if you feel worse than usual.  Manage any other conditions you have as told by your doctor.  Avoid going outside when it is very hot, cold, or humid.  Avoid people who have a sickness you can catch (contagious).  Keep all follow-up visits as told by your doctor. This is important. Contact a doctor if:  You cough up more mucus than usual.  There is a change  in the color or thickness of the mucus.  It is harder to breathe than usual.  Your breathing is faster than usual.  You have trouble sleeping.  You need to use your medicines more often than usual.  You have trouble doing your normal activities such as getting dressed or walking around the house. Get help right away if:  You have shortness of breath while resting.  You have shortness of breath that stops you from: ? Being able to talk. ? Doing normal activities.  Your chest hurts for longer than 5 minutes.  Your skin color is  more blue than usual.  Your pulse oximeter shows that you have low oxygen for longer than 5 minutes.  You have a fever.  You feel too tired to breathe normally. Summary  Chronic obstructive pulmonary disease (COPD) is a long-term lung problem.  The way your lungs work will never return to normal. Usually the condition gets worse over time. There are things you can do to keep yourself as healthy as possible.  Take over-the-counter and prescription medicines only as told by your doctor.  If you smoke, stop. Smoking makes the problem worse. This information is not intended to replace advice given to you by your health care provider. Make sure you discuss any questions you have with your health care provider. Document Released: 11/14/2007 Document Revised: 11/03/2015 Document Reviewed: 01/22/2013 Elsevier Interactive Patient Education  2017 [employer].

## 2018-03-19 NOTE — Progress Notes (Signed)
@Patient  ID: Alexandra [name], female    DOB: [birth year], 77 y.o.   MRN: [id number]  Chief Complaint  Patient presents with  . Follow-up    follow up DOE    Referring provider: [name]  HPI: 77 year old female, former smoker quit 2007 (50 pack year hx). PMH hypertension and dyspnea. Patient of Dr. Halford Chessman, seen for initial consult on 02/27/18 for sob of exertion and dry cough. Needs CXR and PFTs. Continue Albuterol prn.  03/20/2018 Patient presents today for 1 month follow-up visit with full PFTs. Accompanied by her family. Feels well. Complains of sob with exertion. She has been experiencing sob since 2011 when she had PNA. Using Albuterol rescue inh once a week. Reports that it helps somewhat helps. PFTs today showed obstruction.    Significant testing: ECHO 02/21/18- Grossly normal. EF 48-18%, normal systolic function, trace TR/MR CXR 02/27/18- no active cardiopulmonary disease is radiographically apparent. PFTs 03/19/2018 -FVC 1.97 (76%), FEV1 1.21 (63%), ratio 61. DLCOcor 12.03 (55%)   Allergies  Allergen Reactions  . Contrast Media [Iodinated Diagnostic Agents] Hives, Itching and Rash  . Shellfish Allergy Nausea And Vomiting  . Shellfish-Derived Products Nausea And Vomiting     There is no immunization history on file for this patient.  Past Medical History:  Diagnosis Date  . Arthritis   . Cancer [facility]) 1999   left kidney removed  . GERD (gastroesophageal reflux disease)    hx of none recent  . Hyperparathyroidism ([place])   . Hypertension   . Osteoporosis   . Restless legs syndrome     Tobacco History: Social History   Tobacco Use  Smoking Status Former Smoker  . Packs/day: 1.00  . Years: 50.00  . Pack years: 50.00  . Types: Cigarettes  . Last attempt to quit: 06/11/2005  . Years since quitting: 12.7  Smokeless Tobacco Never Used   Counseling given: Not Answered   Outpatient Medications Prior to Visit  Medication Sig Dispense Refill  .  albuterol (PROVENTIL HFA;VENTOLIN HFA) 108 (90 Base) MCG/ACT inhaler Inhale 2 puffs into the lungs at bedtime.    Marland Kitchen amLODipine (NORVASC) 10 MG tablet Take 10 mg by mouth every morning.    . gabapentin (NEURONTIN) 300 MG capsule Take 300 mg by mouth at bedtime.    Marland Kitchen HYDROcodone-acetaminophen (NORCO/VICODIN) 5-325 MG tablet Take 1-2 tablets by mouth every 4 (four) hours as needed for moderate pain. 20 tablet 0  . rOPINIRole (REQUIP) 2 MG tablet Take 2 mg by mouth at bedtime.    . simvastatin (ZOCOR) 40 MG tablet Take 40 mg by mouth at bedtime.      No facility-administered medications prior to visit.     Review of Systems  Review of Systems  Constitutional: Negative.   HENT: Negative.   Respiratory: Positive for shortness of breath.   Cardiovascular: Negative.    Physical Exam  Pulse 95   Ht 5\' 2"  (1.575 m)   Wt 152 lb (68.9 kg)   SpO2 94%   BMI 27.80 kg/m  Physical Exam  Constitutional: She is oriented to person, place, and time. She appears well-developed and well-nourished.  HENT:  Head: Normocephalic and atraumatic.  Eyes: Pupils are equal, round, and reactive to light. EOM are normal.  Neck: Normal range of motion. Neck supple.  Cardiovascular: Normal rate, regular rhythm and normal heart sounds.  No murmur heard. Pulmonary/Chest: Effort normal and breath sounds normal. No respiratory distress. She has no wheezes.  Abdominal: Soft. Bowel sounds  are normal. There is no tenderness.  Neurological: She is alert and oriented to person, place, and time.  Skin: Skin is warm and dry. No rash noted. No erythema.  Psychiatric: She has a normal mood and affect. Her behavior is normal. Judgment normal.     Lab Results:  CBC    Component Value Date/Time   WBC 13.8 (H) 02/27/2018 1142   WBC 10.2 06/26/2016 1444   RBC 4.44 02/27/2018 1142   RBC 3.52 (L) 06/26/2016 1444   HGB 13.4 02/27/2018 1142   HCT 40.7 02/27/2018 1142   PLT 358 06/26/2016 1444   MCV 92 02/27/2018 1142    MCH 30.2 02/27/2018 1142   MCH 30.1 06/26/2016 1444   MCHC 32.9 02/27/2018 1142   MCHC 31.5 06/26/2016 1444   RDW 12.7 02/27/2018 1142   LYMPHSABS 3.1 02/27/2018 1142   EOSABS 0.1 02/27/2018 1142   BASOSABS 0.1 02/27/2018 1142    BMET    Component Value Date/Time   NA 144 02/27/2018 1142   K 5.2 02/27/2018 1142   CL 103 02/27/2018 1142   CO2 21 02/27/2018 1142   GLUCOSE 78 02/27/2018 1142   GLUCOSE 94 06/26/2016 1444   BUN 30 (H) 02/27/2018 1142   CREATININE 1.78 (H) 02/27/2018 1142   CALCIUM 9.5 02/27/2018 1142   GFRNONAA 28 (L) 02/27/2018 1142   GFRAA 32 (L) 02/27/2018 1142    BNP No results found for: BNP  ProBNP No results found for: PROBNP  Imaging: Dg Chest 2 View  Result Date: 02/27/2018 CLINICAL DATA:  Chronic shortness of breath EXAM: CHEST - 2 VIEW COMPARISON:  None. FINDINGS: Atherosclerotic calcification of the aortic arch. Mild thoracic spondylosis. The lungs appear clear.  Cardiac and mediastinal contours normal. No pleural effusion identified. IMPRESSION: 1.  No active cardiopulmonary disease is radiographically apparent. 2.  Aortic Atherosclerosis (ICD10-I70.0). Electronically Signed   By: Van Clines M.D.   On: 02/27/2018 16:48     Assessment & Plan:   COPD, moderate ([place]) 77 year old female, former smoker. Presents with sob on exertion and dry cough since 2011. ECHO and CXR normal. PFTs showed obstruction, new dx COPD gold 2   Testing: - PFTs 03/19/2018 - showed moderate obstruction, impaired DLCO FVC 1.97 (76%), FEV1 1.21 (63%), ratio 61. DLCOcor 12.03 (55%) - CXR 02/27/18- normal  - Consider HRCT to evaluate DLCO, will discuss with Dr. Halford Chessman   Plan: Start Anoro 1 puff daily Continue Albuterol rescue inhaler as needed for sob/wheeze  Follow-up: 4-6 weeks    Martyn Ehrich, NP 03/20/2018

## 2018-03-20 ENCOUNTER — Encounter: Payer: Self-pay | Admitting: Primary Care

## 2018-03-20 DIAGNOSIS — J449 Chronic obstructive pulmonary disease, unspecified: Secondary | ICD-10-CM | POA: Insufficient documentation

## 2018-03-20 NOTE — Assessment & Plan Note (Addendum)
77 year old female, former smoker. Presents with sob on exertion and dry cough since 2011. ECHO and CXR normal. PFTs showed obstruction, new dx COPD gold 2   Testing: - PFTs 03/19/2018 - showed moderate obstruction, impaired DLCO FVC 1.97 (76%), FEV1 1.21 (63%), ratio 61. DLCOcor 12.03 (55%) - CXR 02/27/18- normal  - Consider HRCT to evaluate DLCO, will discuss with Dr. Halford Chessman   Plan: Start Anoro 1 puff daily Continue Albuterol rescue inhaler as needed for sob/wheeze  Follow-up: 4-6 weeks

## 2018-03-24 NOTE — Progress Notes (Signed)
Reviewed and agree with assessment/plan.   Shaylinn Hladik, MD [place] Pulmonary/Critical Care 06/06/2016, 12:24 PM Pager:  [phone]  

## 2018-04-01 DIAGNOSIS — Z23 Encounter for immunization: Secondary | ICD-10-CM | POA: Diagnosis not present

## 2018-04-04 DIAGNOSIS — H906 Mixed conductive and sensorineural hearing loss, bilateral: Secondary | ICD-10-CM | POA: Diagnosis not present

## 2018-04-04 DIAGNOSIS — E782 Mixed hyperlipidemia: Secondary | ICD-10-CM | POA: Diagnosis not present

## 2018-04-04 DIAGNOSIS — J028 Acute pharyngitis due to other specified organisms: Secondary | ICD-10-CM | POA: Diagnosis not present

## 2018-04-04 DIAGNOSIS — Z6826 Body mass index (BMI) 26.0-26.9, adult: Secondary | ICD-10-CM | POA: Diagnosis not present

## 2018-04-04 DIAGNOSIS — E210 Primary hyperparathyroidism: Secondary | ICD-10-CM | POA: Diagnosis not present

## 2018-04-04 DIAGNOSIS — N183 Chronic kidney disease, stage 3 (moderate): Secondary | ICD-10-CM | POA: Diagnosis not present

## 2018-04-04 DIAGNOSIS — I70203 Unspecified atherosclerosis of native arteries of extremities, bilateral legs: Secondary | ICD-10-CM | POA: Diagnosis not present

## 2018-04-04 DIAGNOSIS — J438 Other emphysema: Secondary | ICD-10-CM | POA: Diagnosis not present

## 2018-04-04 DIAGNOSIS — K210 Gastro-esophageal reflux disease with esophagitis: Secondary | ICD-10-CM | POA: Diagnosis not present

## 2018-04-04 DIAGNOSIS — J449 Chronic obstructive pulmonary disease, unspecified: Secondary | ICD-10-CM | POA: Diagnosis not present

## 2018-04-04 DIAGNOSIS — I10 Essential (primary) hypertension: Secondary | ICD-10-CM | POA: Diagnosis not present

## 2018-04-04 DIAGNOSIS — N3946 Mixed incontinence: Secondary | ICD-10-CM | POA: Diagnosis not present

## 2018-04-16 DIAGNOSIS — N393 Stress incontinence (female) (male): Secondary | ICD-10-CM | POA: Diagnosis not present

## 2018-04-16 DIAGNOSIS — N3000 Acute cystitis without hematuria: Secondary | ICD-10-CM | POA: Diagnosis not present

## 2018-04-16 DIAGNOSIS — M47816 Spondylosis without myelopathy or radiculopathy, lumbar region: Secondary | ICD-10-CM | POA: Diagnosis not present

## 2018-04-21 ENCOUNTER — Ambulatory Visit (INDEPENDENT_AMBULATORY_CARE_PROVIDER_SITE_OTHER): Payer: Medicare Other | Admitting: Pulmonary Disease

## 2018-04-21 ENCOUNTER — Encounter: Payer: Self-pay | Admitting: Pulmonary Disease

## 2018-04-21 VITALS — BP 122/88 | HR 97 | Ht 64.0 in | Wt 158.4 lb

## 2018-04-21 DIAGNOSIS — J449 Chronic obstructive pulmonary disease, unspecified: Secondary | ICD-10-CM

## 2018-04-21 MED ORDER — UMECLIDINIUM-VILANTEROL 62.5-25 MCG/INH IN AEPB: 1.0000 | INHALATION_SPRAY | Freq: Every day | RESPIRATORY_TRACT | 11 refills | Status: DC

## 2018-04-21 NOTE — Progress Notes (Signed)
[place] [facility], Critical Care, and Sleep Medicine  Chief Complaint  Patient presents with  . Follow-up    Pt has SOB with exertion, no worse since last ov.     Constitutional:  BP 122/88 (BP Location: Left Arm, Cuff Size: Normal)   Pulse 97   Ht 5\' 4"  (1.626 m)   Wt 158 lb 6.4 oz (71.8 kg)   SpO2 95%   BMI 27.19 kg/m   Past Medical History:  OA, Lt renal cancer, GERD, Hyperparathyroidism, HTN, Osteoporosis, RLS  Brief Summary:  Alexandra [name] is a 77 y.o. female former smoker with COPD.  Breathing has improved.  She is not having as much cough.  Able to keep up with activities better.  Still gets cough when goes out in cold weather.  Anoro has helped.  Not needing to use albuterol.   Physical Exam:   Appearance - well kempt   ENMT - clear nasal mucosa, midline nasal  septum, no oral exudates, no LAN, trachea midline  Respiratory - normal chest wall, normal respiratory effort, no accessory muscle use, no wheeze/rales  CV - s1s2 regular rate and rhythm, no murmurs, no peripheral edema, radial pulses symmetric  GI - soft, non tender, no masses  Lymph - no adenopathy noted in neck and axillary areas  MSK - normal gait  Ext - no cyanosis, clubbing, or joint inflammation noted  Skin - no rashes, lesions, or ulcers  Neuro - normal strength, oriented x 3  Psych - normal mood and affect   CXR 02/27/18 (reviewed by me) >> atherosclerosis PFT from 03/19/18 >> moderate obstruction, air trapping, moderate diffusion defect  Assessment/Plan:   COPD mixed type. - continue anoro - prn albuterol - will get copy of her immunization records  Leg swelling. - she is on amlodipine - advised her to d/w her PCP about whether amlodipine could be contributing   Patient Instructions  Will get copy of immunization records from your primary care physicians office  Follow up in 1 year    Chesley Mires, MD [place] Pager: [phone] 04/21/2018,  10:01 AM  Flow Sheet    Pulmonary tests:  PFT 03/19/18 >> FEV1 1.21 (63%), FEV1% 61, TLC 5.18 (109^%), DLCO 55%  Cardiac tests:  Echo 02/21/18 >> EF 55 to 60%  Medications:   Allergies as of 04/21/2018      Reactions   Contrast Media [iodinated Diagnostic Agents] Hives, Itching, Rash   Shellfish Allergy Nausea And Vomiting   Shellfish-derived Products Nausea And Vomiting      Medication List        Accurate as of 04/21/18 10:01 AM. Always use your most recent med list.          albuterol 108 (90 Base) MCG/ACT inhaler Commonly known as:  PROVENTIL HFA;VENTOLIN HFA Inhale 2 puffs into the lungs at bedtime.   amLODipine 10 MG tablet Commonly known as:  NORVASC Take 10 mg by mouth every morning.   gabapentin 300 MG capsule Commonly known as:  NEURONTIN Take 300 mg by mouth at bedtime.   HYDROcodone-acetaminophen 5-325 MG tablet Commonly known as:  NORCO/VICODIN Take 1-2 tablets by mouth every 4 (four) hours as needed for moderate pain.   rOPINIRole 2 MG tablet Commonly known as:  REQUIP Take 2 mg by mouth at bedtime.   simvastatin 40 MG tablet Commonly known as:  ZOCOR Take 40 mg by mouth at bedtime.   umeclidinium-vilanterol 62.5-25 MCG/INH Aepb Commonly known as:  ANORO ELLIPTA Inhale 1  puff into the lungs daily.       Past Surgical History:  She  has a past surgical history that includes left nephrectomy (1999); Abdominal hysterectomy; Back surgery (2016); bladder tack and uterus lift (10/10/2015); and Parathyroidectomy (Left, 07/02/2016).  Family History:  Her family history includes Emphysema in her mother.  Social History:  She  reports that she quit smoking about 12 years ago. Her smoking use included cigarettes. She has a 50.00 pack-year smoking history. She has never used smokeless tobacco. She reports that she does not drink alcohol or use drugs.

## 2018-04-21 NOTE — Patient Instructions (Signed)
Will get copy of immunization records from your primary care physicians office  Follow up in 1 year

## 2018-04-24 ENCOUNTER — Telehealth: Payer: Self-pay | Admitting: Pulmonary Disease

## 2018-04-24 DIAGNOSIS — J449 Chronic obstructive pulmonary disease, unspecified: Secondary | ICD-10-CM

## 2018-04-24 NOTE — Telephone Encounter (Signed)
Called patient unable to reach left message to give us a call back.

## 2018-04-25 NOTE — Telephone Encounter (Signed)
Attempted to contact pt. I did not receive an answer. I have left a message for pt to return our call.  

## 2018-04-28 NOTE — Telephone Encounter (Signed)
Called and spoke with pt. Pt is requesting a different inhaler other than her current rescue inhaler cost her $309.  Stated to pt we would get the inhaler taken care of for her.  Pt expressed understanding.  Called pt's pharmacy to see what rescue inhaler pt was on.  Spoke with [name] at [facility] pharmacy who stated the Rx was not pt's rescue inhaler but it was the Anoro that was going to cost her $309. Per them, pt has a deductible and no matter what inhaler we changed pt to, until she has met her deductible she is going to run into this same problem.  Dr. Halford Chessman, please advise what you want [place] to do for pt. Thanks!

## 2018-04-29 NOTE — Telephone Encounter (Signed)
Could we try prescribing performist and yupelri in place of anoro.  This could then be run through medicare part b.  If this works, she would need an order for a home nebulizer also.

## 2018-04-29 NOTE — Telephone Encounter (Signed)
I called pt but there was no  answer. Unable to leave message to call back.

## 2018-04-30 NOTE — Telephone Encounter (Signed)
Attempted to call pt but unable to reach pt. Left message for pt to return call.

## 2018-05-01 NOTE — Telephone Encounter (Signed)
Attempted to contact pt. I did not receive an answer. I have left a message for pt to return our call.  

## 2018-05-02 MED ORDER — FORMOTEROL FUMARATE 20 MCG/2ML IN NEBU: 20.0000 ug | INHALATION_SOLUTION | Freq: Two times a day (BID) | RESPIRATORY_TRACT | 11 refills | Status: DC

## 2018-05-02 MED ORDER — REVEFENACIN 175 MCG/3ML IN SOLN: 3.0000 mL | Freq: Every day | RESPIRATORY_TRACT | 11 refills | Status: DC

## 2018-05-02 NOTE — Telephone Encounter (Signed)
Called and spoke with pt letting her knwo that VS said for [place] to send Rx of Yupelri and Perforomist neb sol as well as an order for a neb machine as well.  Pt expressed understanding.  I have placed the order for pt to be provided with neb solutions and a neb machine to receive them from a DME.  Pt does not have a DME so I placed that in the order as well.  Rx's have been printed so VS can sign so we can send them to a new DME for pt. I have removed Anoro off of pt's med list. Nothing further needed.

## 2018-05-12 ENCOUNTER — Telehealth: Payer: Self-pay | Admitting: Pulmonary Disease

## 2018-05-12 NOTE — Telephone Encounter (Signed)
Printed the last 2 office notes and faxed to [facility] Patient Fax # [phone]

## 2018-05-13 DIAGNOSIS — N3946 Mixed incontinence: Secondary | ICD-10-CM | POA: Diagnosis not present

## 2018-05-13 DIAGNOSIS — R3129 Other microscopic hematuria: Secondary | ICD-10-CM | POA: Diagnosis not present

## 2018-05-16 DIAGNOSIS — K5730 Diverticulosis of large intestine without perforation or abscess without bleeding: Secondary | ICD-10-CM | POA: Diagnosis not present

## 2018-05-16 DIAGNOSIS — N289 Disorder of kidney and ureter, unspecified: Secondary | ICD-10-CM | POA: Diagnosis not present

## 2018-05-16 DIAGNOSIS — R3129 Other microscopic hematuria: Secondary | ICD-10-CM | POA: Diagnosis not present

## 2018-05-16 DIAGNOSIS — Z905 Acquired absence of kidney: Secondary | ICD-10-CM | POA: Diagnosis not present

## 2018-05-28 DIAGNOSIS — R3129 Other microscopic hematuria: Secondary | ICD-10-CM | POA: Diagnosis not present

## 2018-05-28 DIAGNOSIS — N2889 Other specified disorders of kidney and ureter: Secondary | ICD-10-CM | POA: Diagnosis not present

## 2018-06-02 DIAGNOSIS — N2889 Other specified disorders of kidney and ureter: Secondary | ICD-10-CM | POA: Diagnosis not present

## 2018-06-09 ENCOUNTER — Telehealth: Payer: Self-pay | Admitting: Pulmonary Disease

## 2018-06-09 NOTE — Telephone Encounter (Signed)
The forms for the ONO request per [facility] assessment is on VS desk for review. VS is out of the office until 06/16/2018 Will review at that time.

## 2018-06-09 NOTE — Telephone Encounter (Signed)
Went out to lobby and met with Lennette Bihari, [facility].   He had a order form for the Patient requesting ONO, per [facility] assessment.  Informed him Dr. Halford Chessman will not be back in the office until next week.  Lennette Bihari stated that it was fine.  If Dr. Halford Chessman would like it ordered, he just needs to sign form and fax form back to [facility].  Fax number is on form.  Form hand delivered to [facility].    Will route to [place], as [employer]

## 2018-06-09 NOTE — Telephone Encounter (Signed)
Alexandra [name] with [facility] is in the lobby about ordering evening O and O for [employer]. please advise

## 2018-06-16 DIAGNOSIS — C672 Malignant neoplasm of lateral wall of bladder: Secondary | ICD-10-CM | POA: Diagnosis not present

## 2018-06-16 DIAGNOSIS — N2889 Other specified disorders of kidney and ureter: Secondary | ICD-10-CM | POA: Diagnosis not present

## 2018-06-23 NOTE — Telephone Encounter (Signed)
Spoke with VS on Friday 06/20/2018 regarding form that is needing to be completed on patient. VS would like to know what and why is this form needing completed and signed. He would like to know what is this, is it a new process with [facility].  LVM for Len Childs with [facility] this morning on cell [phone]. X1

## 2018-06-24 DIAGNOSIS — E7800 Pure hypercholesterolemia, unspecified: Secondary | ICD-10-CM | POA: Diagnosis not present

## 2018-06-24 DIAGNOSIS — I10 Essential (primary) hypertension: Secondary | ICD-10-CM | POA: Diagnosis not present

## 2018-06-24 DIAGNOSIS — K219 Gastro-esophageal reflux disease without esophagitis: Secondary | ICD-10-CM | POA: Diagnosis not present

## 2018-06-24 DIAGNOSIS — J449 Chronic obstructive pulmonary disease, unspecified: Secondary | ICD-10-CM | POA: Diagnosis not present

## 2018-06-24 DIAGNOSIS — Z79899 Other long term (current) drug therapy: Secondary | ICD-10-CM | POA: Diagnosis not present

## 2018-06-24 DIAGNOSIS — Z87891 Personal history of nicotine dependence: Secondary | ICD-10-CM | POA: Diagnosis not present

## 2018-06-24 DIAGNOSIS — N189 Chronic kidney disease, unspecified: Secondary | ICD-10-CM | POA: Diagnosis not present

## 2018-06-24 DIAGNOSIS — I129 Hypertensive chronic kidney disease with stage 1 through stage 4 chronic kidney disease, or unspecified chronic kidney disease: Secondary | ICD-10-CM | POA: Diagnosis not present

## 2018-06-24 DIAGNOSIS — C672 Malignant neoplasm of lateral wall of bladder: Secondary | ICD-10-CM | POA: Diagnosis not present

## 2018-06-24 DIAGNOSIS — N329 Bladder disorder, unspecified: Secondary | ICD-10-CM | POA: Diagnosis not present

## 2018-06-26 DIAGNOSIS — C672 Malignant neoplasm of lateral wall of bladder: Secondary | ICD-10-CM | POA: Diagnosis not present

## 2018-06-30 NOTE — Telephone Encounter (Signed)
Spoke with VS on Friday 06/20/2018 regarding form that is needing to be completed on patient. VS would like to know what and why is this form needing completed and signed. He would like to know what is this, is it a new process with [facility].  LVM for Alexandra [name] with [facility] this morning on cell [phone]. X2

## 2018-07-01 NOTE — Telephone Encounter (Signed)
LVM for Alexandra [name] with [facility] this morning on cell [phone]. X3

## 2018-07-03 ENCOUNTER — Telehealth: Payer: Self-pay | Admitting: Pulmonary Disease

## 2018-07-03 NOTE — Telephone Encounter (Signed)
Will route message to [facility] to follow up

## 2018-07-03 NOTE — Telephone Encounter (Signed)
Per last telephone message; Alexandra [name] returned phone call [phone]) he is in [place] in training and can be reached until 1pm;   Alexandra [name] states he was passing on information ; not recommending anything; he dropped the form off to see if VS will recommend the patient to have an oximetry done because when the respiratory therapist was with the patient the patient was having trouble breathing.

## 2018-07-03 NOTE — Telephone Encounter (Signed)
  Alexandra [name] states he was passing on information ; not recommending anything; he dropped the form off to see if VS will recommend the patient to have an oximetry done because when the respiratory therapist was with the patient the patient was having trouble breathing.  Nothing further needed

## 2018-07-03 NOTE — Telephone Encounter (Signed)
Spoke with VS on Friday 06/20/2018 regarding form that is needing to be completed on patient. VS would like to know what and why is this form needing completed and signed. He would like to know what is this, is it a new process with [facility]. Need more details on why is this form needing completed on this patient?   LVM for Len Childs with [facility] this morning on cell [phone]. X4

## 2018-07-09 DIAGNOSIS — H26493 Other secondary cataract, bilateral: Secondary | ICD-10-CM | POA: Diagnosis not present

## 2018-08-04 DIAGNOSIS — Z6827 Body mass index (BMI) 27.0-27.9, adult: Secondary | ICD-10-CM | POA: Diagnosis not present

## 2018-08-04 DIAGNOSIS — I70203 Unspecified atherosclerosis of native arteries of extremities, bilateral legs: Secondary | ICD-10-CM | POA: Diagnosis not present

## 2018-08-04 DIAGNOSIS — E782 Mixed hyperlipidemia: Secondary | ICD-10-CM | POA: Diagnosis not present

## 2018-08-04 DIAGNOSIS — J0100 Acute maxillary sinusitis, unspecified: Secondary | ICD-10-CM | POA: Diagnosis not present

## 2018-08-04 DIAGNOSIS — I10 Essential (primary) hypertension: Secondary | ICD-10-CM | POA: Diagnosis not present

## 2018-08-04 DIAGNOSIS — G2581 Restless legs syndrome: Secondary | ICD-10-CM | POA: Diagnosis not present

## 2018-08-04 DIAGNOSIS — E210 Primary hyperparathyroidism: Secondary | ICD-10-CM | POA: Diagnosis not present

## 2018-08-04 DIAGNOSIS — N183 Chronic kidney disease, stage 3 (moderate): Secondary | ICD-10-CM | POA: Diagnosis not present

## 2018-08-04 DIAGNOSIS — J449 Chronic obstructive pulmonary disease, unspecified: Secondary | ICD-10-CM | POA: Diagnosis not present

## 2018-08-04 DIAGNOSIS — C679 Malignant neoplasm of bladder, unspecified: Secondary | ICD-10-CM | POA: Diagnosis not present

## 2018-08-04 DIAGNOSIS — H906 Mixed conductive and sensorineural hearing loss, bilateral: Secondary | ICD-10-CM | POA: Diagnosis not present

## 2018-08-04 DIAGNOSIS — K210 Gastro-esophageal reflux disease with esophagitis: Secondary | ICD-10-CM | POA: Diagnosis not present

## 2018-08-05 DIAGNOSIS — Z905 Acquired absence of kidney: Secondary | ICD-10-CM | POA: Diagnosis not present

## 2018-08-05 DIAGNOSIS — C679 Malignant neoplasm of bladder, unspecified: Secondary | ICD-10-CM | POA: Insufficient documentation

## 2018-08-05 DIAGNOSIS — N289 Disorder of kidney and ureter, unspecified: Secondary | ICD-10-CM | POA: Diagnosis not present

## 2018-08-05 DIAGNOSIS — N281 Cyst of kidney, acquired: Secondary | ICD-10-CM | POA: Diagnosis not present

## 2018-08-05 DIAGNOSIS — R32 Unspecified urinary incontinence: Secondary | ICD-10-CM | POA: Insufficient documentation

## 2018-08-05 DIAGNOSIS — Z85528 Personal history of other malignant neoplasm of kidney: Secondary | ICD-10-CM | POA: Diagnosis not present

## 2018-08-05 DIAGNOSIS — N2889 Other specified disorders of kidney and ureter: Secondary | ICD-10-CM | POA: Insufficient documentation

## 2018-08-05 DIAGNOSIS — Z8551 Personal history of malignant neoplasm of bladder: Secondary | ICD-10-CM | POA: Diagnosis not present

## 2018-08-06 DIAGNOSIS — J0100 Acute maxillary sinusitis, unspecified: Secondary | ICD-10-CM | POA: Diagnosis not present

## 2018-08-25 DIAGNOSIS — N2889 Other specified disorders of kidney and ureter: Secondary | ICD-10-CM | POA: Diagnosis not present

## 2018-09-26 DIAGNOSIS — C672 Malignant neoplasm of lateral wall of bladder: Secondary | ICD-10-CM | POA: Diagnosis not present

## 2018-09-30 DIAGNOSIS — Z79899 Other long term (current) drug therapy: Secondary | ICD-10-CM | POA: Diagnosis not present

## 2018-09-30 DIAGNOSIS — Z87891 Personal history of nicotine dependence: Secondary | ICD-10-CM | POA: Diagnosis not present

## 2018-09-30 DIAGNOSIS — C672 Malignant neoplasm of lateral wall of bladder: Secondary | ICD-10-CM | POA: Diagnosis not present

## 2018-09-30 DIAGNOSIS — M1990 Unspecified osteoarthritis, unspecified site: Secondary | ICD-10-CM | POA: Diagnosis not present

## 2018-09-30 DIAGNOSIS — I10 Essential (primary) hypertension: Secondary | ICD-10-CM | POA: Diagnosis not present

## 2018-09-30 DIAGNOSIS — E039 Hypothyroidism, unspecified: Secondary | ICD-10-CM | POA: Diagnosis not present

## 2018-09-30 DIAGNOSIS — R3129 Other microscopic hematuria: Secondary | ICD-10-CM | POA: Diagnosis not present

## 2018-09-30 DIAGNOSIS — E785 Hyperlipidemia, unspecified: Secondary | ICD-10-CM | POA: Diagnosis not present

## 2018-09-30 DIAGNOSIS — K219 Gastro-esophageal reflux disease without esophagitis: Secondary | ICD-10-CM | POA: Diagnosis not present

## 2018-09-30 DIAGNOSIS — J449 Chronic obstructive pulmonary disease, unspecified: Secondary | ICD-10-CM | POA: Diagnosis not present

## 2018-10-15 DIAGNOSIS — C672 Malignant neoplasm of lateral wall of bladder: Secondary | ICD-10-CM | POA: Diagnosis not present

## 2018-10-15 DIAGNOSIS — N2889 Other specified disorders of kidney and ureter: Secondary | ICD-10-CM | POA: Diagnosis not present

## 2018-12-01 ENCOUNTER — Other Ambulatory Visit: Payer: Self-pay | Admitting: Urology

## 2018-12-01 DIAGNOSIS — N2889 Other specified disorders of kidney and ureter: Secondary | ICD-10-CM

## 2018-12-03 ENCOUNTER — Ambulatory Visit
Admission: RE | Admit: 2018-12-03 | Discharge: 2018-12-03 | Disposition: A | Discharge status: Home / Self Care | Payer: Medicare Other | Source: Ambulatory Visit | Attending: Urology | Admitting: Urology

## 2018-12-03 ENCOUNTER — Other Ambulatory Visit: Payer: Self-pay | Admitting: *Deleted

## 2018-12-03 ENCOUNTER — Other Ambulatory Visit: Payer: Self-pay

## 2018-12-03 DIAGNOSIS — J449 Chronic obstructive pulmonary disease, unspecified: Secondary | ICD-10-CM | POA: Diagnosis not present

## 2018-12-03 DIAGNOSIS — Z85528 Personal history of other malignant neoplasm of kidney: Secondary | ICD-10-CM | POA: Diagnosis not present

## 2018-12-03 DIAGNOSIS — N183 Chronic kidney disease, stage 3 (moderate): Secondary | ICD-10-CM | POA: Diagnosis not present

## 2018-12-03 DIAGNOSIS — N2889 Other specified disorders of kidney and ureter: Secondary | ICD-10-CM

## 2018-12-03 DIAGNOSIS — N3946 Mixed incontinence: Secondary | ICD-10-CM | POA: Diagnosis not present

## 2018-12-03 DIAGNOSIS — C679 Malignant neoplasm of bladder, unspecified: Secondary | ICD-10-CM | POA: Diagnosis not present

## 2018-12-03 DIAGNOSIS — E210 Primary hyperparathyroidism: Secondary | ICD-10-CM | POA: Diagnosis not present

## 2018-12-03 DIAGNOSIS — E782 Mixed hyperlipidemia: Secondary | ICD-10-CM | POA: Diagnosis not present

## 2018-12-03 DIAGNOSIS — I70203 Unspecified atherosclerosis of native arteries of extremities, bilateral legs: Secondary | ICD-10-CM | POA: Diagnosis not present

## 2018-12-03 DIAGNOSIS — G2581 Restless legs syndrome: Secondary | ICD-10-CM | POA: Diagnosis not present

## 2018-12-03 DIAGNOSIS — I10 Essential (primary) hypertension: Secondary | ICD-10-CM | POA: Diagnosis not present

## 2018-12-03 DIAGNOSIS — Z6828 Body mass index (BMI) 28.0-28.9, adult: Secondary | ICD-10-CM | POA: Diagnosis not present

## 2018-12-03 DIAGNOSIS — Z1159 Encounter for screening for other viral diseases: Secondary | ICD-10-CM | POA: Diagnosis not present

## 2018-12-03 DIAGNOSIS — Z905 Acquired absence of kidney: Secondary | ICD-10-CM | POA: Diagnosis not present

## 2018-12-03 HISTORY — PX: IR RADIOLOGIST EVAL & MGMT: IMG5224

## 2018-12-03 NOTE — Progress Notes (Signed)
Patient ID: Alexandra [name], female   DOB: [birth year], 78 y.o.   MRN: [id number]       Chief Complaint: Patient was consulted remotely today (TeleHealth) for indeterminate right renal mass at the request of [place].    Referring Physician(s): Chao,Roberto  History of Present Illness: Alexandra [name] is a 78 y.o. female who initially presented to urology with hematuria.  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  She also has had cystoscopy and treatment for low-grade noninvasive bladder TCC.  She is referred because of the right renal mass.  CT and MR imaging has all been without contrast because of an elevated creatinine/GFR.  The lesion has slightly grown on the posterior aspect of the right kidney but still may represent slow growth of a complex hemorrhagic proteinaceous cyst versus an indeterminate solid renal lesion.  Currently she is asymptomatic without significant flank or abdominal pain.  No current hematuria or dysuria.  Baseline functional status is very good.  Past Medical History:  Diagnosis Date  . Arthritis   . Cancer [facility]) 1999   left kidney removed  . GERD (gastroesophageal reflux disease)    hx of none recent  . Hyperparathyroidism ([place])   . Hypertension   . Osteoporosis   . Restless legs syndrome     Past Surgical History:  Procedure Laterality Date  . ABDOMINAL HYSTERECTOMY     complete  . BACK SURGERY  2016   lower L 4 to L 5  . bladder tack and uterus lift  10/10/2015  . left nephrectomy  1999  . PARATHYROIDECTOMY Left 07/02/2016   Procedure: LEFT INFERIOR PARATHYROIDECTOMY;  Surgeon: Armandina Gemma, MD;  Location: [facility];  Service: General;  Laterality: Left;    Allergies: Contrast media [iodinated diagnostic agents], Shellfish allergy, and Shellfish-derived products  Medications: Prior to Admission medications   Medication Sig Start Date End Date Taking? Authorizing  Provider  albuterol (PROVENTIL HFA;VENTOLIN HFA) 108 (90 Base) MCG/ACT inhaler Inhale 2 puffs into the lungs at bedtime.    [provider]  amLODipine (NORVASC) 10 MG tablet Take 10 mg by mouth every morning.    [provider]  formoterol (PERFOROMIST) 20 MCG/2ML nebulizer solution Take 2 mLs (20 mcg total) by nebulization 2 (two) times daily. 05/02/18   Chesley Mires, MD  gabapentin (NEURONTIN) 300 MG capsule Take 300 mg by mouth at bedtime.    [provider]  HYDROcodone-acetaminophen (NORCO/VICODIN) 5-325 MG tablet Take 1-2 tablets by mouth every 4 (four) hours as needed for moderate pain. 07/02/16   Armandina Gemma, MD  Revefenacin (YUPELRI) 175 MCG/3ML SOLN Inhale 3 mLs into the lungs daily. 05/02/18   Chesley Mires, MD  rOPINIRole (REQUIP) 2 MG tablet Take 2 mg by mouth at bedtime.    [provider]  simvastatin (ZOCOR) 40 MG tablet Take 40 mg by mouth at bedtime.     [provider]     Family History  Problem Relation Age of Onset  . Emphysema Mother     Social History   Socioeconomic History  . Marital status: Married    Spouse name: Not on file  . Number of children: Not on file  . Years of education: Not on file  . Highest education level: Not on file  Occupational History  . Not on file  Social Needs  . Financial resource strain: Not on file  . Food insecurity  Worry: Not on file    Inability: Not on file  . Transportation needs    Medical: Not on file    Non-medical: Not on file  Tobacco Use  . Smoking status: Former Smoker    Packs/day: 1.00    Years: 50.00    Pack years: 50.00    Types: Cigarettes    Quit date: 06/11/2005    Years since quitting: 13.4  . Smokeless tobacco: Never Used  Substance and Sexual Activity  . Alcohol use: No  . Drug use: No  . Sexual activity: Not on file  Lifestyle  . Physical activity    Days per week: Not on file    Minutes per session: Not on file  . Stress: Not on file   Relationships  . Social Herbalist on phone: Not on file    Gets together: Not on file    Attends religious service: Not on file    Active member of club or organization: Not on file    Attends meetings of clubs or organizations: Not on file    Relationship status: Not on file  Other Topics Concern  . Not on file  Social History Narrative  . Not on file    Review of Systems  Review of Systems: A 12 point ROS discussed and pertinent positives are indicated in the HPI above.  All other systems are negative.  Physical Exam No direct physical exam was performed   Vital Signs: There were no vitals taken for this visit.  Imaging: No results found.  Labs:  CBC: Recent Labs    02/27/18 1142  WBC 13.8*  HGB 13.4  HCT 40.7    COAGS: No results for input(s): INR, APTT in the last 8760 hours.  BMP: Recent Labs    02/27/18 1142  NA 144  K 5.2  CL 103  CO2 21  GLUCOSE 78  BUN 30*  CALCIUM 9.5  CREATININE 1.78*  GFRNONAA 28*  GFRAA 32*    LIVER FUNCTION TESTS: Recent Labs    02/27/18 1142  BILITOT 0.2  AST 21  ALT 18  ALKPHOS 93  PROT 6.9  ALBUMIN 4.3    TUMOR MARKERS: No results for input(s): AFPTM, CEA, CA199, CHROMGRNA in the last 8760 hours.  Assessment and Plan:  Slow enlargement of a indeterminate complex right renal cyst versus mass.  Lesion is homogeneously high T1 and T2 and signal measuring up to 2.9 cm.  The lesion has increased in size compared to 2017.  Without contrast, the abnormality remains indeterminate.  Patient has several additional other right renal cyst which are smaller.  This is the dominant lesion.  Most recent MRI was 06/02/2018.  Treatment options including surveillance, biopsy, and cryoablation reviewed by telephone with her husband today.  All questions were addressed.  Prior to any decision I would recommend updating her imaging which is now 38 months old.  She is in agreement with this.  Plan: Repeat abdominal MRI  without contrast and also get a renal ultrasound which may help determine if this is a cyst or solid lesion.  Outpatient tele follow-up to review repeat imaging.  Thank you for this interesting consult.  I greatly enjoyed meeting Alexandra [name] and look forward to participating in their care.  A copy of this report was sent to the requesting provider on this date.  Electronically Signed: Greggory Keen 12/03/2018, 2:50 PM   I spent a total of  30 Minutes  in remote  clinical consultation, greater than 50% of which was counseling/coordinating care for with an indeterminate right renal mass.    Visit type: Audio only (telephone). Audio (no video) only due to patient's lack of internet/smartphone capability. Alternative for in-person consultation at [facility], [address] [place], [place], [place]. This visit type was conducted due to national recommendations for restrictions regarding the COVID-19 Pandemic (e.g. social distancing).  This format is felt to be most appropriate for this patient at this time.  All issues noted in this document were discussed and addressed.

## 2018-12-04 DIAGNOSIS — K5730 Diverticulosis of large intestine without perforation or abscess without bleeding: Secondary | ICD-10-CM | POA: Diagnosis not present

## 2018-12-04 DIAGNOSIS — N2889 Other specified disorders of kidney and ureter: Secondary | ICD-10-CM | POA: Diagnosis not present

## 2018-12-05 DIAGNOSIS — Z0000 Encounter for general adult medical examination without abnormal findings: Secondary | ICD-10-CM | POA: Diagnosis not present

## 2018-12-05 DIAGNOSIS — Z1159 Encounter for screening for other viral diseases: Secondary | ICD-10-CM | POA: Diagnosis not present

## 2018-12-05 DIAGNOSIS — Z6828 Body mass index (BMI) 28.0-28.9, adult: Secondary | ICD-10-CM | POA: Diagnosis not present

## 2018-12-17 DIAGNOSIS — N281 Cyst of kidney, acquired: Secondary | ICD-10-CM | POA: Diagnosis not present

## 2018-12-17 DIAGNOSIS — Z905 Acquired absence of kidney: Secondary | ICD-10-CM | POA: Diagnosis not present

## 2018-12-17 DIAGNOSIS — N2889 Other specified disorders of kidney and ureter: Secondary | ICD-10-CM | POA: Diagnosis not present

## 2018-12-22 ENCOUNTER — Encounter: Payer: Self-pay | Admitting: *Deleted

## 2018-12-22 ENCOUNTER — Other Ambulatory Visit: Payer: Self-pay | Admitting: Interventional Radiology

## 2018-12-22 DIAGNOSIS — N2889 Other specified disorders of kidney and ureter: Secondary | ICD-10-CM

## 2018-12-22 NOTE — Addendum Note (Signed)
Encounter addended by: Marcelyn Bruins on: 12/22/2018 11:25 AM  Actions taken: Imaging Exam ended

## 2018-12-31 ENCOUNTER — Other Ambulatory Visit: Payer: Self-pay

## 2018-12-31 ENCOUNTER — Ambulatory Visit
Admission: RE | Admit: 2018-12-31 | Discharge: 2018-12-31 | Disposition: A | Discharge status: Home / Self Care | Payer: Medicare Other | Source: Ambulatory Visit | Attending: Interventional Radiology | Admitting: Interventional Radiology

## 2018-12-31 ENCOUNTER — Encounter: Payer: Self-pay | Admitting: *Deleted

## 2018-12-31 DIAGNOSIS — N281 Cyst of kidney, acquired: Secondary | ICD-10-CM | POA: Diagnosis not present

## 2018-12-31 DIAGNOSIS — Z905 Acquired absence of kidney: Secondary | ICD-10-CM | POA: Diagnosis not present

## 2018-12-31 DIAGNOSIS — Z85528 Personal history of other malignant neoplasm of kidney: Secondary | ICD-10-CM | POA: Diagnosis not present

## 2018-12-31 DIAGNOSIS — N2889 Other specified disorders of kidney and ureter: Secondary | ICD-10-CM

## 2018-12-31 HISTORY — PX: IR RADIOLOGIST EVAL & MGMT: IMG5224

## 2018-12-31 NOTE — Progress Notes (Signed)
Patient ID: Alexandra [name], female   DOB: [birth year], 78 y.o.   MRN: [id number]       Chief Complaint: Patient was consulted remotely today ([place]) for indeterminate right renal lesion at the request of Dr.  Nila Nephew  Referring Physician(s): Dr. Nila Nephew  History of Present Illness: Alexandra REYNOSO is a 78 y.o. female who initially presented to urology in [place] with hematuria.  She has a remote history of left renal cell carcinoma requiring nephrectomy in 2003.  She recently had cystoscopy and treatment for low-grade noninvasive bladder cancer.  She has a chronically elevated creatinine/GFR therefore her imaging is without contrast.  She originally was referred for an indeterminate right renal mass.  She remains asymptomatic.  No flank or abdominal pain.  No current hematuria or dysuria.  Stable functional status.  No fevers or recent illness.  This telemetry visit is to review her recent imaging including a updated MRI without contrast and a renal ultrasound.  Past Medical History:  Diagnosis Date  . Arthritis   . Cancer [facility]) 1999   left kidney removed  . GERD (gastroesophageal reflux disease)    hx of none recent  . Hyperparathyroidism ([place])   . Hypertension   . Osteoporosis   . Restless legs syndrome     Past Surgical History:  Procedure Laterality Date  . ABDOMINAL HYSTERECTOMY     complete  . BACK SURGERY  2016   lower L 4 to L 5  . bladder tack and uterus lift  10/10/2015  . IR RADIOLOGIST EVAL & MGMT  12/03/2018  . left nephrectomy  1999  . PARATHYROIDECTOMY Left 07/02/2016   Procedure: LEFT INFERIOR PARATHYROIDECTOMY;  Surgeon: Armandina Gemma, MD;  Location: [facility];  Service: General;  Laterality: Left;    Allergies: Contrast media [iodinated diagnostic agents], Shellfish allergy, and Shellfish-derived products  Medications: Prior to Admission medications   Medication Sig Start Date End Date Taking? Authorizing Provider  albuterol (PROVENTIL HFA;VENTOLIN HFA) 108 (90  Base) MCG/ACT inhaler Inhale 2 puffs into the lungs at bedtime.    [provider]  amLODipine (NORVASC) 10 MG tablet Take 10 mg by mouth every morning.    [provider]  formoterol (PERFOROMIST) 20 MCG/2ML nebulizer solution Take 2 mLs (20 mcg total) by nebulization 2 (two) times daily. 05/02/18   Chesley Mires, MD  gabapentin (NEURONTIN) 300 MG capsule Take 300 mg by mouth at bedtime.    [provider]  HYDROcodone-acetaminophen (NORCO/VICODIN) 5-325 MG tablet Take 1-2 tablets by mouth every 4 (four) hours as needed for moderate pain. 07/02/16   Armandina Gemma, MD  Revefenacin (YUPELRI) 175 MCG/3ML SOLN Inhale 3 mLs into the lungs daily. 05/02/18   Chesley Mires, MD  rOPINIRole (REQUIP) 2 MG tablet Take 2 mg by mouth at bedtime.    [provider]  simvastatin (ZOCOR) 40 MG tablet Take 40 mg by mouth at bedtime.     [provider]     Family History  Problem Relation Age of Onset  . Emphysema Mother     Social History   Socioeconomic History  . Marital status: Married    Spouse name: Not on file  . Number of children: Not on file  . Years of education: Not on file  . Highest education level: Not on file  Occupational History  . Not on file  Social Needs  . Financial resource strain: Not on file  . Food insecurity    Worry: Not on file  Inability: Not on file  . Transportation needs    Medical: Not on file    Non-medical: Not on file  Tobacco Use  . Smoking status: Former Smoker    Packs/day: 1.00    Years: 50.00    Pack years: 50.00    Types: Cigarettes    Quit date: 06/11/2005    Years since quitting: 13.5  . Smokeless tobacco: Never Used  Substance and Sexual Activity  . Alcohol use: No  . Drug use: No  . Sexual activity: Not on file  Lifestyle  . Physical activity    Days per week: Not on file    Minutes per session: Not on file  . Stress: Not on file  Relationships  . Social Herbalist on phone: Not on  file    Gets together: Not on file    Attends religious service: Not on file    Active member of club or organization: Not on file    Attends meetings of clubs or organizations: Not on file    Relationship status: Not on file  Other Topics Concern  . Not on file  Social History Narrative  . Not on file     Review of Systems  Review of Systems: A 12 point ROS discussed and pertinent positives are indicated in the HPI above.  All other systems are negative.  Physical Exam No direct physical exam was performed, tele visit only   Vital Signs: There were no vitals taken for this visit.  Imaging: Ir Radiologist Eval & Mgmt  Result Date: 12/22/2018 Please refer to notes tab for details about interventional procedure. (Op Note)   Labs:  CBC: Recent Labs    02/27/18 1142  WBC 13.8*  HGB 13.4  HCT 40.7    COAGS: No results for input(s): INR, APTT in the last 8760 hours.  BMP: Recent Labs    02/27/18 1142  NA 144  K 5.2  CL 103  CO2 21  GLUCOSE 78  BUN 30*  CALCIUM 9.5  CREATININE 1.78*  GFRNONAA 28*  GFRAA 32*    LIVER FUNCTION TESTS: Recent Labs    02/27/18 1142  BILITOT 0.2  AST 21  ALT 18  ALKPHOS 93  PROT 6.9  ALBUMIN 4.3    Assessment and Plan:  The updated MRI without contrast and renal ultrasound were reviewed.  Imaging findings discussed by telephone with the patient and her husband.  The indeterminant 3 cm right renal lesion by MRI represents a renal cyst by comparison ultrasound.  This was the main lesion of concern.  There are additional right renal cysts noted.  There is an additional 11 mm right kidney upper pole indeterminate lesion by MRI and ultrasound, also favored to represent a proteinaceous or hemorrhagic cyst.  Currently none of these lesions warrant biopsy or ablation.  Recommend continued imaging surveillance.  They are in agreement with this.  Plan: Repeat renal ultrasound in 1 year at [facility] and follow-up with Dr.  Nila Nephew who also sees her for bladder cancer.  Electronically Signed: Greggory Keen 12/31/2018, 10:47 AM   I spent a total of    15 Minutes in remote  clinical consultation, greater than 50% of which was counseling/coordinating care for this patient with right renal cysts.    Visit type: Audio only (telephone). Audio (no video) only due to patient's lack of internet/smartphone capability. Alternative for in-person consultation at [facility], [address] [place], [place], [place]. This visit type was  conducted due to national recommendations for restrictions regarding the COVID-19 Pandemic (e.g. social distancing).  This format is felt to be most appropriate for this patient at this time.  All issues noted in this document were discussed and addressed.

## 2019-01-05 DIAGNOSIS — M810 Age-related osteoporosis without current pathological fracture: Secondary | ICD-10-CM | POA: Diagnosis not present

## 2019-01-05 DIAGNOSIS — M85852 Other specified disorders of bone density and structure, left thigh: Secondary | ICD-10-CM | POA: Diagnosis not present

## 2019-01-15 DIAGNOSIS — C672 Malignant neoplasm of lateral wall of bladder: Secondary | ICD-10-CM | POA: Diagnosis not present

## 2019-01-15 DIAGNOSIS — N2889 Other specified disorders of kidney and ureter: Secondary | ICD-10-CM | POA: Diagnosis not present

## 2019-01-23 DIAGNOSIS — K210 Gastro-esophageal reflux disease with esophagitis: Secondary | ICD-10-CM | POA: Diagnosis not present

## 2019-01-23 DIAGNOSIS — Z1159 Encounter for screening for other viral diseases: Secondary | ICD-10-CM | POA: Diagnosis not present

## 2019-01-23 DIAGNOSIS — M546 Pain in thoracic spine: Secondary | ICD-10-CM | POA: Diagnosis not present

## 2019-01-23 DIAGNOSIS — Z6827 Body mass index (BMI) 27.0-27.9, adult: Secondary | ICD-10-CM | POA: Diagnosis not present

## 2019-03-06 ENCOUNTER — Other Ambulatory Visit: Payer: Self-pay | Admitting: Pulmonary Disease

## 2019-03-17 DIAGNOSIS — Z23 Encounter for immunization: Secondary | ICD-10-CM | POA: Diagnosis not present

## 2019-04-03 DIAGNOSIS — K2100 Gastro-esophageal reflux disease with esophagitis, without bleeding: Secondary | ICD-10-CM | POA: Diagnosis not present

## 2019-04-03 DIAGNOSIS — M25551 Pain in right hip: Secondary | ICD-10-CM | POA: Diagnosis not present

## 2019-04-03 DIAGNOSIS — Z1159 Encounter for screening for other viral diseases: Secondary | ICD-10-CM | POA: Diagnosis not present

## 2019-04-03 DIAGNOSIS — M810 Age-related osteoporosis without current pathological fracture: Secondary | ICD-10-CM | POA: Diagnosis not present

## 2019-04-03 DIAGNOSIS — I70203 Unspecified atherosclerosis of native arteries of extremities, bilateral legs: Secondary | ICD-10-CM | POA: Diagnosis not present

## 2019-04-03 DIAGNOSIS — G2581 Restless legs syndrome: Secondary | ICD-10-CM | POA: Diagnosis not present

## 2019-04-03 DIAGNOSIS — N281 Cyst of kidney, acquired: Secondary | ICD-10-CM | POA: Diagnosis not present

## 2019-04-03 DIAGNOSIS — N3946 Mixed incontinence: Secondary | ICD-10-CM | POA: Diagnosis not present

## 2019-04-03 DIAGNOSIS — Z6827 Body mass index (BMI) 27.0-27.9, adult: Secondary | ICD-10-CM | POA: Diagnosis not present

## 2019-04-03 DIAGNOSIS — C679 Malignant neoplasm of bladder, unspecified: Secondary | ICD-10-CM | POA: Diagnosis not present

## 2019-04-03 DIAGNOSIS — J449 Chronic obstructive pulmonary disease, unspecified: Secondary | ICD-10-CM | POA: Diagnosis not present

## 2019-04-20 DIAGNOSIS — C672 Malignant neoplasm of lateral wall of bladder: Secondary | ICD-10-CM | POA: Diagnosis not present

## 2019-04-20 DIAGNOSIS — N2889 Other specified disorders of kidney and ureter: Secondary | ICD-10-CM | POA: Diagnosis not present

## 2019-04-20 DIAGNOSIS — N3946 Mixed incontinence: Secondary | ICD-10-CM | POA: Diagnosis not present

## 2019-04-28 DIAGNOSIS — N281 Cyst of kidney, acquired: Secondary | ICD-10-CM | POA: Diagnosis not present

## 2019-05-05 DIAGNOSIS — D494 Neoplasm of unspecified behavior of bladder: Secondary | ICD-10-CM | POA: Diagnosis not present

## 2019-05-05 DIAGNOSIS — C672 Malignant neoplasm of lateral wall of bladder: Secondary | ICD-10-CM | POA: Diagnosis not present

## 2019-05-13 DIAGNOSIS — N2889 Other specified disorders of kidney and ureter: Secondary | ICD-10-CM | POA: Diagnosis not present

## 2019-05-13 DIAGNOSIS — C672 Malignant neoplasm of lateral wall of bladder: Secondary | ICD-10-CM | POA: Diagnosis not present

## 2019-05-21 ENCOUNTER — Encounter: Payer: Self-pay | Admitting: Cardiology

## 2019-05-21 ENCOUNTER — Ambulatory Visit (INDEPENDENT_AMBULATORY_CARE_PROVIDER_SITE_OTHER): Payer: Medicare Other | Admitting: Cardiology

## 2019-05-21 ENCOUNTER — Other Ambulatory Visit: Payer: Self-pay

## 2019-05-21 VITALS — BP 146/84 | HR 85 | Ht 64.0 in | Wt 162.0 lb

## 2019-05-21 DIAGNOSIS — I10 Essential (primary) hypertension: Secondary | ICD-10-CM

## 2019-05-21 DIAGNOSIS — Z87891 Personal history of nicotine dependence: Secondary | ICD-10-CM

## 2019-05-21 NOTE — Progress Notes (Signed)
Cardiology Office Note:    Date:  05/21/2019   ID:  Alexandra [name], Alexandra [name] 1940/08/20, MRN [id number]  PCP:  [name], MD  Cardiologist:  Jenean Lindau, MD   Referring MD: [name],*    ASSESSMENT:    1. Essential hypertension   2. Ex-smoker    PLAN:    In order of problems listed above:  1. Chest discomfort: This has resolved.  Patient is comfortable.  Echocardiogram and stress test in the past have been unremarkable and these were reviewed with the patient. 2. Essential hypertension: Blood pressure stable patient is taking her medications appropriately.  Lifestyle modification and salt intake issues were discussed with the patient at length. 3. Ex-smoker: She promises never to go back. 4. Importance of regular exercise to the best of her ability were stressed and weight reduction was stressed.Patient will be seen in follow-up appointment in 6 months or earlier if the patient has any concerns    Medication Adjustments/Labs and Tests Ordered: Current medicines are reviewed at length with the patient today.  Concerns regarding medicines are outlined above.  No orders of the defined types were placed in this encounter.  No orders of the defined types were placed in this encounter.    Chief Complaint  Patient presents with  . Follow-up     History of Present Illness:    Alexandra [name] is a 78 y.o. female.  She has past medical history of essential hypertension.  She denies any problems at this time and takes care of activities of daily living.  No chest pain orthopnea or PND.  She does not exercise on a regular basis because of orthopedic issues involving her back and her hip.  Her chest discomfort issues have resolved.  She mentions to me that her lipids are fine and I reviewed lipids done a few months ago and they were in good shape.  At the time of my evaluation, the patient is alert awake oriented and in no distress.  Past Medical History:   Diagnosis Date  . Arthritis   . Cancer [facility]) 1999   left kidney removed  . GERD (gastroesophageal reflux disease)    hx of none recent  . Hyperparathyroidism ([place])   . Hypertension   . Osteoporosis   . Restless legs syndrome     Past Surgical History:  Procedure Laterality Date  . ABDOMINAL HYSTERECTOMY     complete  . BACK SURGERY  2016   lower L 4 to L 5  . bladder tack and uterus lift  10/10/2015  . IR RADIOLOGIST EVAL & MGMT  12/03/2018  . IR RADIOLOGIST EVAL & MGMT  12/31/2018  . left nephrectomy  1999  . PARATHYROIDECTOMY Left 07/02/2016   Procedure: LEFT INFERIOR PARATHYROIDECTOMY;  Surgeon: Armandina Gemma, MD;  Location: [facility];  Service: General;  Laterality: Left;    Current Medications: Current Meds  Medication Sig  . albuterol (PROVENTIL HFA;VENTOLIN HFA) 108 (90 Base) MCG/ACT inhaler Inhale 2 puffs into the lungs at bedtime.  Marland Kitchen amLODipine (NORVASC) 10 MG tablet Take 10 mg by mouth every morning.  . gabapentin (NEURONTIN) 300 MG capsule Take 300 mg by mouth at bedtime.  Marland Kitchen PERFOROMIST 20 MCG/2ML nebulizer solution USE 1 VIAL  IN  NEBULIZER TWICE  DAILY - -Morning and Evening (Patient taking differently: Uses 2 vials in the morning and 1 vial in evening)  . rOPINIRole (REQUIP) 2 MG tablet Take 2 mg by mouth at bedtime.  Marland Kitchen  simvastatin (ZOCOR) 40 MG tablet Take 40 mg by mouth at bedtime.   Maretta Bees 175 MCG/3ML SOLN USE 1 VIAL IN NEBULIZER DAILY     Allergies:   Contrast media [iodinated diagnostic agents], Shellfish allergy, and Shellfish-derived products   Social History   Socioeconomic History  . Marital status: Married    Spouse name: Not on file  . Number of children: Not on file  . Years of education: Not on file  . Highest education level: Not on file  Occupational History  . Not on file  Tobacco Use  . Smoking status: Former Smoker    Packs/day: 1.00    Years: 50.00    Pack years: 50.00    Types: Cigarettes    Quit date: 06/11/2005    Years since  quitting: 13.9  . Smokeless tobacco: Never Used  Substance and Sexual Activity  . Alcohol use: No  . Drug use: No  . Sexual activity: Not on file  Other Topics Concern  . Not on file  Social History Narrative  . Not on file   Social Determinants of Health   Financial Resource Strain:   . Difficulty of Paying Living Expenses: Not on file  Food Insecurity:   . Worried About Charity fundraiser in the Last Year: Not on file  . Ran Out of Food in the Last Year: Not on file  Transportation Needs:   . Lack of Transportation (Medical): Not on file  . Lack of Transportation (Non-Medical): Not on file  Physical Activity:   . Days of Exercise per Week: Not on file  . Minutes of Exercise per Session: Not on file  Stress:   . Feeling of Stress : Not on file  Social Connections:   . Frequency of Communication with Friends and Family: Not on file  . Frequency of Social Gatherings with Friends and Family: Not on file  . Attends Religious Services: Not on file  . Active Member of Clubs or Organizations: Not on file  . Attends Archivist Meetings: Not on file  . Marital Status: Not on file     Family History: The patient's family history includes Emphysema in her mother.  ROS:   Please see the history of present illness.    All other systems reviewed and are negative.  EKGs/Labs/Other Studies Reviewed:    The following studies were reviewed today: Results of the echocardiogram and stress test done earlier were discussed with the patient   Recent Labs: No results found for requested labs within last 8760 hours.  Recent Lipid Panel No results found for: CHOL, TRIG, HDL, CHOLHDL, VLDL, LDLCALC, LDLDIRECT  Physical Exam:    VS:  BP (!) 146/84 (BP Location: Left Arm, Patient Position: Sitting)   Pulse 85   Ht 5\' 4"  (1.626 m)   Wt 162 lb (73.5 kg)   SpO2 98%   BMI 27.81 kg/m     Wt Readings from Last 3 Encounters:  05/21/19 162 lb (73.5 kg)  04/21/18 158 lb 6.4 oz  (71.8 kg)  03/19/18 152 lb (68.9 kg)     GEN: Patient is in no acute distress HEENT: Normal NECK: No JVD; No carotid bruits LYMPHATICS: No lymphadenopathy CARDIAC: Hear sounds regular, 2/6 systolic murmur at the apex. RESPIRATORY:  Clear to auscultation without rales, wheezing or rhonchi  ABDOMEN: Soft, non-tender, non-distended MUSCULOSKELETAL:  No edema; No deformity  SKIN: Warm and dry NEUROLOGIC:  Alert and oriented x 3 PSYCHIATRIC:  Normal affect  Signed, Jenean Lindau, MD  05/21/2019 9:17 AM    [place]

## 2019-05-21 NOTE — Patient Instructions (Signed)
Medication Instructions:  Your physician recommends that you continue on your current medications as directed. Please refer to the Current Medication list given to you today.  *If you need a refill on your cardiac medications before your next appointment, please call your pharmacy*  Lab Work: None.  If you have labs (blood work) drawn today and your tests are completely normal, you will receive your results only by: . MyChart Message (if you have MyChart) OR . A paper copy in the mail If you have any lab test that is abnormal or we need to change your treatment, we will call you to review the results.  Testing/Procedures: None.   Follow-Up: At [facility], you and your health needs are our priority.  As part of our continuing mission to provide you with exceptional heart care, we have created designated Provider Care Teams.  These Care Teams include your primary Cardiologist (physician) and Advanced Practice Providers (APPs -  Physician Assistants and Nurse Practitioners) who all work together to provide you with the care you need, when you need it.  Your next appointment:   6 month(s)  The format for your next appointment:   In Person  Provider:   Rajan Revankar, MD  Other Instructions   

## 2019-05-25 ENCOUNTER — Ambulatory Visit (INDEPENDENT_AMBULATORY_CARE_PROVIDER_SITE_OTHER): Payer: Medicare Other | Admitting: Pulmonary Disease

## 2019-05-25 ENCOUNTER — Encounter: Payer: Self-pay | Admitting: Pulmonary Disease

## 2019-05-25 DIAGNOSIS — J449 Chronic obstructive pulmonary disease, unspecified: Secondary | ICD-10-CM | POA: Diagnosis not present

## 2019-05-25 MED ORDER — YUPELRI 175 MCG/3ML IN SOLN: 1.0000 | Freq: Every day | RESPIRATORY_TRACT | 11 refills | Status: DC

## 2019-05-25 MED ORDER — PERFOROMIST 20 MCG/2ML IN NEBU: INHALATION_SOLUTION | RESPIRATORY_TRACT | 11 refills | Status: DC

## 2019-05-25 NOTE — Progress Notes (Signed)
[facility]  Chief Complaint  Patient presents with  . Follow-up    Patient needs medication refills for nebulizer    Constitutional:  There were no vitals taken for this visit.  Deferred.  Past Medical History:  OA, Lt renal cancer, GERD, Hyperparathyroidism, HTN, Osteoporosis, RLS  Brief Summary:  Alexandra [name] is a 78 y.o. female former smoker with COPD.  Virtual Visit via Telephone Note  I connected with Alexandra [name] on 05/25/19 at  2:15 PM EST by telephone and verified that I am speaking with the correct person using two identifiers.  Location: Patient: home Provider: medical office   I discussed the limitations, risks, security and privacy concerns of performing an evaluation and management service by telephone and the availability of in person appointments. I also discussed with the patient that there may be a patient responsible charge related to this service. The patient expressed understanding and agreed to proceed.  Uses yupelri and performist in the morning.  Use performist again in the evening.  Not needing to use albuterol.  Not having cough, wheeze, sputum, chest pain, fever or hemoptysis.  Has chronic ankle swelling.  Physical Exam:   Deferred.  Assessment/Plan:   COPD mixed type. - refills sent for yupelri and perforomist - prn albuterol   Patient Instructions  Follow up in 1 year    I discussed the assessment and treatment plan with the patient. The patient was provided an opportunity to ask questions and all were answered. The patient agreed with the plan and demonstrated an understanding of the instructions.   The patient was advised to call back or seek an in-person evaluation if the symptoms worsen or if the condition fails to improve as anticipated.  I provided 11 minutes of non-face-to-face time during this encounter.   Chesley Mires, MD [facility] Pager:  [phone] 05/25/2019, 2:21 PM  Flow Sheet    Pulmonary tests:  PFT 03/19/18 >> FEV1 1.21 (63%), FEV1% 61, TLC 5.18 (109%), DLCO 55%  Cardiac tests:  Echo 02/21/18 >> EF 55 to 60%  Medications:   Allergies as of 05/25/2019      Reactions   Contrast Media [iodinated Diagnostic Agents] Hives, Itching, Rash   Shellfish Allergy Nausea And Vomiting   Shellfish-derived Products Nausea And Vomiting      Medication List       Accurate as of May 25, 2019  2:21 PM. If you have any questions, ask your nurse or doctor.        albuterol 108 (90 Base) MCG/ACT inhaler Commonly known as: VENTOLIN HFA Inhale 2 puffs into the lungs at bedtime.   amLODipine 10 MG tablet Commonly known as: NORVASC Take 10 mg by mouth every morning.   gabapentin 300 MG capsule Commonly known as: NEURONTIN Take 300 mg by mouth at bedtime.   Perforomist 20 MCG/2ML nebulizer solution Generic drug: formoterol USE 1 VIAL  IN  NEBULIZER TWICE  DAILY - -Morning and Evening What changed: See the new instructions. Changed by: Chesley Mires, MD   rOPINIRole 2 MG tablet Commonly known as: REQUIP Take 2 mg by mouth at bedtime.   simvastatin 40 MG tablet Commonly known as: ZOCOR Take 40 mg by mouth at bedtime.   Yupelri 175 MCG/3ML Soln Generic drug: Revefenacin Inhale 1 vial into the lungs daily. What changed: See the new instructions. Changed by: Chesley Mires, MD       Past Surgical History:  She  has a  past surgical history that includes left nephrectomy (1999); Abdominal hysterectomy; Back surgery (2016); bladder tack and uterus lift (10/10/2015); Parathyroidectomy (Left, 07/02/2016); IR Radiologist Eval & Mgmt (12/03/2018); and IR Radiologist Eval & Mgmt (12/31/2018).  Family History:  Her family history includes Emphysema in her mother.  Social History:  She  reports that she quit smoking about 13 years ago. Her smoking use included cigarettes. She has a 50.00 pack-year smoking history. She  has never used smokeless tobacco. She reports that she does not drink alcohol or use drugs.

## 2019-05-25 NOTE — Patient Instructions (Signed)
Follow up in 1 year.

## 2019-06-19 DIAGNOSIS — M533 Sacrococcygeal disorders, not elsewhere classified: Secondary | ICD-10-CM | POA: Diagnosis not present

## 2019-06-25 DIAGNOSIS — G8929 Other chronic pain: Secondary | ICD-10-CM | POA: Diagnosis not present

## 2019-06-25 DIAGNOSIS — M533 Sacrococcygeal disorders, not elsewhere classified: Secondary | ICD-10-CM | POA: Diagnosis not present

## 2019-06-25 DIAGNOSIS — M47816 Spondylosis without myelopathy or radiculopathy, lumbar region: Secondary | ICD-10-CM | POA: Diagnosis not present

## 2019-07-16 DIAGNOSIS — M5416 Radiculopathy, lumbar region: Secondary | ICD-10-CM | POA: Diagnosis not present

## 2019-07-16 DIAGNOSIS — M545 Low back pain: Secondary | ICD-10-CM | POA: Diagnosis not present

## 2019-07-17 DIAGNOSIS — M4316 Spondylolisthesis, lumbar region: Secondary | ICD-10-CM | POA: Diagnosis not present

## 2019-08-03 DIAGNOSIS — N393 Stress incontinence (female) (male): Secondary | ICD-10-CM | POA: Insufficient documentation

## 2019-08-03 DIAGNOSIS — M47817 Spondylosis without myelopathy or radiculopathy, lumbosacral region: Secondary | ICD-10-CM | POA: Diagnosis not present

## 2019-08-03 DIAGNOSIS — K2100 Gastro-esophageal reflux disease with esophagitis, without bleeding: Secondary | ICD-10-CM | POA: Insufficient documentation

## 2019-08-03 DIAGNOSIS — H906 Mixed conductive and sensorineural hearing loss, bilateral: Secondary | ICD-10-CM | POA: Insufficient documentation

## 2019-08-03 DIAGNOSIS — N1831 Chronic kidney disease, stage 3a: Secondary | ICD-10-CM | POA: Insufficient documentation

## 2019-08-03 DIAGNOSIS — M4727 Other spondylosis with radiculopathy, lumbosacral region: Secondary | ICD-10-CM | POA: Diagnosis not present

## 2019-08-04 ENCOUNTER — Ambulatory Visit (INDEPENDENT_AMBULATORY_CARE_PROVIDER_SITE_OTHER): Payer: Medicare Other | Admitting: Legal Medicine

## 2019-08-04 ENCOUNTER — Other Ambulatory Visit: Payer: Self-pay

## 2019-08-04 ENCOUNTER — Encounter: Payer: Self-pay | Admitting: Legal Medicine

## 2019-08-04 VITALS — BP 132/74 | HR 88 | Temp 97.2°F | Resp 16 | Ht 64.0 in | Wt 163.4 lb

## 2019-08-04 DIAGNOSIS — N1831 Chronic kidney disease, stage 3a: Secondary | ICD-10-CM

## 2019-08-04 DIAGNOSIS — K2100 Gastro-esophageal reflux disease with esophagitis, without bleeding: Secondary | ICD-10-CM

## 2019-08-04 DIAGNOSIS — M810 Age-related osteoporosis without current pathological fracture: Secondary | ICD-10-CM | POA: Diagnosis not present

## 2019-08-04 DIAGNOSIS — I10 Essential (primary) hypertension: Secondary | ICD-10-CM

## 2019-08-04 DIAGNOSIS — H906 Mixed conductive and sensorineural hearing loss, bilateral: Secondary | ICD-10-CM | POA: Diagnosis not present

## 2019-08-04 DIAGNOSIS — N393 Stress incontinence (female) (male): Secondary | ICD-10-CM | POA: Diagnosis not present

## 2019-08-04 DIAGNOSIS — E782 Mixed hyperlipidemia: Secondary | ICD-10-CM | POA: Diagnosis not present

## 2019-08-04 MED ORDER — ALENDRONATE SODIUM 70 MG PO TABS: 70.0000 mg | ORAL_TABLET | ORAL | 3 refills | Status: DC

## 2019-08-04 NOTE — Assessment & Plan Note (Signed)
A personal plan was formulated for incontinence. Patient is to continue kegal exercises.  Plan was formulated using history, physical exam of patient, and labs/x-rays performed to develop plan of care using evidence based criteria.

## 2019-08-04 NOTE — Assessment & Plan Note (Signed)
AN INDIVIDUAL CARE PLAN was established and reinforced today.  The patient's status was assessed using clinical findings on exam, labs, and other diagnostic testing. Patient's success at meeting treatment goals based on disease specific evidence-bassed guidelines and found to be in good control. RECOMMENDATIONS include maintaining present medicines and treatment. 

## 2019-08-04 NOTE — Assessment & Plan Note (Signed)
AN INDIVIDUAL CARE PLAN was established and reinforced today.  The patient's status was assessed using clinical findings on exam, labs, and other diagnostic testing. Patient's success at meeting treatment goals based on disease specific evidence-bassed guidelines and found to be in good control. RECOMMENDATIONS include maintaining present medicines and treatment. Use hearing aids

## 2019-08-04 NOTE — Assessment & Plan Note (Signed)
An individualized care plan was established and reinforced.  The patient's disease status was assessed using clinical findings on exam, labs, and other diagnostic tests..  The patient's success in meeting treatment goals is based on disease specific evidence-based guidelines and patient is found to be in good control.  Patient is to continue medicines.

## 2019-08-04 NOTE — Assessment & Plan Note (Signed)
Plan of care was formulated today.  She is doing well.  A plan of care was formulated using patient exam, tests and other sources to optimize care using evidence based information.  Recommend no smoking, no eating after supper, avoid fatty foods, elevate Head of bed, avoid tight fitting clothing.  Continue on nexium.

## 2019-08-04 NOTE — Assessment & Plan Note (Signed)
AN INDIVIDUAL CARE PLAN was established and reinforced today.  The patient's status was assessed using clinical findings on exam, labs, and other diagnostic testing. Patient's success at meeting treatment goals based on disease specific evidence-bassed guidelines and found to be in good control. RECOMMENDATIONS include maintaining present medicines and treatment. Last Dexa 2020.

## 2019-08-04 NOTE — Assessment & Plan Note (Signed)
AN INDIVIDUAL CARE PLAN was established and reinforced today.  The patient's status was assessed using clinical findings on exam, lab and other diagnostic tests. The patient's disease status was assessed based on evidence-based guidelines and found to be well controlled. MEDICATIONS were reviewed. SELF MANAGEMENT GOALS have been discussed and patient's success at attaining the goal of low cholesterol was assessed. RECOMMENDATION given include regular exercise 3 days a week and low cholesterol/low fat diet. CLINICAL SUMMARY including written plan to identify barriers unique to the patient due to social or economic  reasons was discussed. 

## 2019-08-04 NOTE — Progress Notes (Signed)
Established Patient Office Visit  Subjective:  Patient ID: Alexandra [name], female    DOB: 1941/03/07  Age: 79 y.o. MRN: [id number]  CC:  Chief Complaint  Patient presents with  . Gastroesophageal Reflux  . Chronic Kidney Disease  . Hyperlipidemia  . Hypertension    HPI Alexandra [name] presents for Chronic visit.  Patient has gastroesophageal reflux symptoms withesophagitis and LTRD.  The symptoms are moderate intensity.  Length of symptoms 10 years.  Medicines include nexium.  Complications include none.  Patient presents for follow up of hypertension.  Patient tolerating amlodipine well with side effects.  Patient was diagnosed with hypertension 2010 so has been treated for hypertension for 10 years.Patient is working on maintaining diet and exercise regimen and follows up as directed. Complication include none.  Patient presents with hyperlipidemia.  Compliance with treatment has been good; patient takes medicines as directed, maintains low cholesterol diet, follows up as directed, and maintains exercise regimen.  Patient is using simvastatin without problems.  Past Medical History:  Diagnosis Date  . Arthritis   . Cancer [facility]) 1999   left kidney removed  . GERD (gastroesophageal reflux disease)    hx of none recent  . Hyperparathyroidism ([place])   . Hypertension   . Osteoporosis   . Restless legs syndrome     Past Surgical History:  Procedure Laterality Date  . ABDOMINAL HYSTERECTOMY     complete  . BACK SURGERY  2016   lower L 4 to L 5  . bladder tack and uterus lift  10/10/2015  . IR RADIOLOGIST EVAL & MGMT  12/03/2018  . IR RADIOLOGIST EVAL & MGMT  12/31/2018  . left nephrectomy  1999  . PARATHYROIDECTOMY Left 07/02/2016   Procedure: LEFT INFERIOR PARATHYROIDECTOMY;  Surgeon: Armandina Gemma, MD;  Location: [facility];  Service: General;  Laterality: Left;    Family History  Problem Relation Age of Onset  . Emphysema Mother     Social History   Socioeconomic  History  . Marital status: Married    Spouse name: Not on file  . Number of children: Not on file  . Years of education: Not on file  . Highest education level: Not on file  Occupational History  . Not on file  Tobacco Use  . Smoking status: Former Smoker    Packs/day: 1.00    Years: 50.00    Pack years: 50.00    Types: Cigarettes    Quit date: 06/11/2005    Years since quitting: 14.1  . Smokeless tobacco: Never Used  Substance and Sexual Activity  . Alcohol use: No  . Drug use: No  . Sexual activity: Not on file  Other Topics Concern  . Not on file  Social History Narrative  . Not on file   Social Determinants of Health   Financial Resource Strain:   . Difficulty of Paying Living Expenses: Not on file  Food Insecurity:   . Worried About Charity fundraiser in the Last Year: Not on file  . Ran Out of Food in the Last Year: Not on file  Transportation Needs:   . Lack of Transportation (Medical): Not on file  . Lack of Transportation (Non-Medical): Not on file  Physical Activity:   . Days of Exercise per Week: Not on file  . Minutes of Exercise per Session: Not on file  Stress:   . Feeling of Stress : Not on file  Social Connections:   . Frequency of Communication with  Friends and Family: Not on file  . Frequency of Social Gatherings with Friends and Family: Not on file  . Attends Religious Services: Not on file  . Active Member of Clubs or Organizations: Not on file  . Attends Archivist Meetings: Not on file  . Marital Status: Not on file  Intimate Partner Violence:   . Fear of Current or Ex-Partner: Not on file  . Emotionally Abused: Not on file  . Physically Abused: Not on file  . Sexually Abused: Not on file    Outpatient Medications Prior to Visit  Medication Sig Dispense Refill  . amLODipine (NORVASC) 10 MG tablet Take 10 mg by mouth every morning.    Marland Kitchen esomeprazole (NEXIUM) 40 MG capsule Take 40 mg by mouth daily at 12 noon.    . formoterol  (PERFOROMIST) 20 MCG/2ML nebulizer solution USE 1 VIAL  IN  NEBULIZER TWICE  DAILY - -Morning and Evening 60 mL 11  . rOPINIRole (REQUIP) 2 MG tablet Take 2 mg by mouth at bedtime.    . simvastatin (ZOCOR) 40 MG tablet Take 40 mg by mouth at bedtime.     Marland Kitchen alendronate (FOSAMAX) 70 MG tablet Take 70 mg by mouth once a week. Take with a full glass of water on an empty stomach.    Marland Kitchen albuterol (PROVENTIL HFA;VENTOLIN HFA) 108 (90 Base) MCG/ACT inhaler Inhale 2 puffs into the lungs at bedtime.    . gabapentin (NEURONTIN) 300 MG capsule Take 300 mg by mouth at bedtime.    . Revefenacin (YUPELRI) 175 MCG/3ML SOLN Inhale 1 vial into the lungs daily. 90 mL 11   No facility-administered medications prior to visit.    Allergies  Allergen Reactions  . Contrast Media [Iodinated Diagnostic Agents] Hives, Itching and Rash  . Shellfish Allergy Nausea And Vomiting  . Shellfish-Derived Products Nausea And Vomiting    ROS Review of Systems  Constitutional: Negative.   HENT: Negative.   Eyes: Negative.   Respiratory: Negative.   Cardiovascular: Negative.   Gastrointestinal:       Dyspepsia  Endocrine: Negative.   Musculoskeletal: Negative.   Psychiatric/Behavioral: Negative.       Objective:    Physical Exam  Constitutional: She is oriented to person, place, and time. She appears well-developed and well-nourished.  HENT:  Head: Normocephalic and atraumatic.  Eyes: Pupils are equal, round, and reactive to light. Conjunctivae and EOM are normal.  Cardiovascular: Normal rate, regular rhythm and normal heart sounds.  Pulmonary/Chest: Effort normal and breath sounds normal.  Abdominal: Soft. Bowel sounds are normal.  Musculoskeletal:        General: Normal range of motion.     Cervical back: Neck supple.  Neurological: She is alert and oriented to person, place, and time. She has normal reflexes.  Skin: Rash noted. There is erythema.  1cm macular area red left cheek  Vitals reviewed.   BP  132/74   Pulse 88   Temp (!) 97.2 F (36.2 C)   Resp 16   Ht 5\' 4"  (1.626 m)   Wt 163 lb 6.4 oz (74.1 kg)   SpO2 96%   BMI 28.05 kg/m  Wt Readings from Last 3 Encounters:  08/04/19 163 lb 6.4 oz (74.1 kg)  05/21/19 162 lb (73.5 kg)  04/21/18 158 lb 6.4 oz (71.8 kg)     Health Maintenance Due  Topic Date Due  . TETANUS/TDAP  01/22/1962  . DEXA SCAN  01/23/2008  . PNA vac Low Risk Adult (2  of 2 - PPSV23) 03/27/2018    There are no preventive care reminders to display for this patient.  No results found for: TSH Lab Results  Component Value Date   WBC 13.8 (H) 02/27/2018   HGB 13.4 02/27/2018   HCT 40.7 02/27/2018   MCV 92 02/27/2018   PLT 358 06/26/2016   Lab Results  Component Value Date   NA 144 02/27/2018   K 5.2 02/27/2018   CO2 21 02/27/2018   GLUCOSE 78 02/27/2018   BUN 30 (H) 02/27/2018   CREATININE 1.78 (H) 02/27/2018   BILITOT 0.2 02/27/2018   ALKPHOS 93 02/27/2018   AST 21 02/27/2018   ALT 18 02/27/2018   PROT 6.9 02/27/2018   ALBUMIN 4.3 02/27/2018   CALCIUM 9.5 02/27/2018   ANIONGAP 5 06/26/2016   No results found for: CHOL No results found for: HDL No results found for: LDLCALC No results found for: TRIG No results found for: CHOLHDL No results found for: HGBA1C    Assessment & Plan:   Problem List Items Addressed This Visit      Cardiovascular and Mediastinum   Essential hypertension    An individualized care plan was established and reinforced.  The patient's disease status was assessed using clinical findings on exam, labs, and other diagnostic tests..  The patient's success in meeting treatment goals is based on disease specific evidence-based guidelines and patient is found to be in good control.  Patient is to continue medicines.       Relevant Orders   CBC with Differential   Comprehensive metabolic panel     Digestive   Reflux esophagitis    Plan of care was formulated today.  She is doing well.  A plan of care was  formulated using patient exam, tests and other sources to optimize care using evidence based information.  Recommend no smoking, no eating after supper, avoid fatty foods, elevate Head of bed, avoid tight fitting clothing.  Continue on nexium.        Nervous and Auditory   Mixed conductive and sensorineural hearing loss, bilateral    AN INDIVIDUAL CARE PLAN was established and reinforced today.  The patient's status was assessed using clinical findings on exam, labs, and other diagnostic testing. Patient's success at meeting treatment goals based on disease specific evidence-bassed guidelines and found to be in good control. RECOMMENDATIONS include maintaining present medicines and treatment. Use hearing aids        Musculoskeletal and Integument   Age related osteoporosis - Primary    AN INDIVIDUAL CARE PLAN was established and reinforced today.  The patient's status was assessed using clinical findings on exam, labs, and other diagnostic testing. Patient's success at meeting treatment goals based on disease specific evidence-bassed guidelines and found to be in good control. RECOMMENDATIONS include maintaining present medicines and treatment. Last Dexa 2020.      Relevant Medications   alendronate (FOSAMAX) 70 MG tablet     Genitourinary   Chronic kidney disease, stage 3a    AN INDIVIDUAL CARE PLAN was established and reinforced today.  The patient's status was assessed using clinical findings on exam, labs, and other diagnostic testing. Patient's success at meeting treatment goals based on disease specific evidence-bassed guidelines and found to be in good control. RECOMMENDATIONS include maintaining present medicines and treatment.        Other   Hyperlipemia    AN INDIVIDUAL CARE PLAN was established and reinforced today.  The patient's status was assessed using clinical  findings on exam, lab and other diagnostic tests. The patient's disease status was assessed based on  evidence-based guidelines and found to be well controlled. MEDICATIONS were reviewed. SELF MANAGEMENT GOALS have been discussed and patient's success at attaining the goal of low cholesterol was assessed. RECOMMENDATION given include regular exercise 3 days a week and low cholesterol/low fat diet. CLINICAL SUMMARY including written plan to identify barriers unique to the patient due to social or economic  reasons was discussed.      Relevant Orders   Lipid Panel   Female stress incontinence    A personal plan was formulated for incontinence. Patient is to continue kegal exercises.  Plan was formulated using history, physical exam of patient, and labs/x-rays performed to develop plan of care using evidence based criteria.         Meds ordered this encounter  Medications  . alendronate (FOSAMAX) 70 MG tablet    Sig: Take 1 tablet (70 mg total) by mouth once a week. Take with a full glass of water on an empty stomach.    Dispense:  12 tablet    Refill:  3    Follow-up: Return in about 4 months (around 12/02/2019) for fasting.    Reinaldo Meeker, MD

## 2019-08-05 LAB — COMPREHENSIVE METABOLIC PANEL
ALT: 11 IU/L (ref 0–32)
AST: 17 IU/L (ref 0–40)
Albumin/Globulin Ratio: 1.9 (ref 1.2–2.2)
Albumin: 4.3 g/dL (ref 3.7–4.7)
Alkaline Phosphatase: 94 IU/L (ref 39–117)
BUN/Creatinine Ratio: 17 (ref 12–28)
BUN: 32 mg/dL — ABNORMAL HIGH (ref 8–27)
Bilirubin Total: 0.2 mg/dL (ref 0.0–1.2)
CO2: 16 mmol/L — ABNORMAL LOW (ref 20–29)
Calcium: 9.1 mg/dL (ref 8.7–10.3)
Chloride: 106 mmol/L (ref 96–106)
Creatinine, Ser: 1.9 mg/dL — ABNORMAL HIGH (ref 0.57–1.00)
GFR calc Af Amer: 29 mL/min/{1.73_m2} — ABNORMAL LOW (ref >59)
GFR calc non Af Amer: 25 mL/min/{1.73_m2} — ABNORMAL LOW (ref >59)
Globulin, Total: 2.3 g/dL (ref 1.5–4.5)
Glucose: 108 mg/dL — ABNORMAL HIGH (ref 65–99)
Potassium: 4.8 mmol/L (ref 3.5–5.2)
Sodium: 140 mmol/L (ref 134–144)
Total Protein: 6.6 g/dL (ref 6.0–8.5)

## 2019-08-05 LAB — CBC WITH DIFFERENTIAL/PLATELET
Basophils Absolute: 0 10*3/uL (ref 0.0–0.2)
Basos: 0 %
EOS (ABSOLUTE): 0 10*3/uL (ref 0.0–0.4)
Eos: 0 %
Hematocrit: 36.7 % (ref 34.0–46.6)
Hemoglobin: 12 g/dL (ref 11.1–15.9)
Immature Grans (Abs): 0.1 10*3/uL (ref 0.0–0.1)
Immature Granulocytes: 1 %
Lymphocytes Absolute: 2 10*3/uL (ref 0.7–3.1)
Lymphs: 12 %
MCH: 29.8 pg (ref 26.6–33.0)
MCHC: 32.7 g/dL (ref 31.5–35.7)
MCV: 91 fL (ref 79–97)
Monocytes Absolute: 0.8 10*3/uL (ref 0.1–0.9)
Monocytes: 5 %
Neutrophils Absolute: 14.1 10*3/uL — ABNORMAL HIGH (ref 1.4–7.0)
Neutrophils: 82 %
Platelets: 440 10*3/uL (ref 150–450)
RBC: 4.03 x10E6/uL (ref 3.77–5.28)
RDW: 14.1 % (ref 11.7–15.4)
WBC: 17 10*3/uL — ABNORMAL HIGH (ref 3.4–10.8)

## 2019-08-05 LAB — LIPID PANEL
Chol/HDL Ratio: 2.9 ratio (ref 0.0–4.4)
Cholesterol, Total: 154 mg/dL (ref 100–199)
HDL: 54 mg/dL (ref >39)
LDL Chol Calc (NIH): 80 mg/dL (ref 0–99)
Triglycerides: 111 mg/dL (ref 0–149)
VLDL Cholesterol Cal: 20 mg/dL (ref 5–40)

## 2019-08-05 LAB — CARDIOVASCULAR RISK ASSESSMENT

## 2019-08-05 NOTE — Progress Notes (Signed)
WBC high with neutrophils- sick?, kidney tests no changes, liver tests ok, Cholesterol normal

## 2019-08-11 DIAGNOSIS — N3289 Other specified disorders of bladder: Secondary | ICD-10-CM | POA: Diagnosis not present

## 2019-08-11 DIAGNOSIS — C672 Malignant neoplasm of lateral wall of bladder: Secondary | ICD-10-CM | POA: Diagnosis not present

## 2019-08-28 DIAGNOSIS — M25521 Pain in right elbow: Secondary | ICD-10-CM | POA: Diagnosis not present

## 2019-09-02 DIAGNOSIS — H26493 Other secondary cataract, bilateral: Secondary | ICD-10-CM | POA: Diagnosis not present

## 2019-09-14 ENCOUNTER — Telehealth: Payer: Self-pay | Admitting: Legal Medicine

## 2019-09-14 NOTE — Progress Notes (Signed)
  Chronic Care Management   Note  09/14/2019 Name: ELASHA TESS MRN: [id number] DOB: [birth year]  PAULENE TAYAG is a 79 y.o. year old female who is a primary care patient of [name], MD. I reached out to [name] by phone today in response to a referral sent by Ms. Lenn Cal Tuch's PCP, [name], MD.   Ms. Zuver was given information about Chronic Care Management services today including:  1. CCM service includes personalized support from designated clinical staff supervised by her physician, including individualized plan of care and coordination with other care providers 2. 24/7 contact phone numbers for assistance for urgent and routine care needs. 3. Service will only be billed when office clinical staff spend 20 minutes or more in a month to coordinate care. 4. Only one practitioner may furnish and bill the service in a calendar month. 5. The patient may stop CCM services at any time (effective at the end of the month) by phone call to the office staff.   Patient agreed to services and verbal consent obtained.   Follow up plan:   Earney Hamburg Upstream Scheduler

## 2019-09-17 ENCOUNTER — Other Ambulatory Visit: Payer: Self-pay | Admitting: Legal Medicine

## 2019-09-17 DIAGNOSIS — M47817 Spondylosis without myelopathy or radiculopathy, lumbosacral region: Secondary | ICD-10-CM | POA: Diagnosis not present

## 2019-09-17 DIAGNOSIS — M4327 Fusion of spine, lumbosacral region: Secondary | ICD-10-CM | POA: Diagnosis not present

## 2019-09-21 DIAGNOSIS — S0180XA Unspecified open wound of other part of head, initial encounter: Secondary | ICD-10-CM | POA: Diagnosis not present

## 2019-09-21 DIAGNOSIS — S42252A Displaced fracture of greater tuberosity of left humerus, initial encounter for closed fracture: Secondary | ICD-10-CM | POA: Diagnosis not present

## 2019-09-21 DIAGNOSIS — S0990XA Unspecified injury of head, initial encounter: Secondary | ICD-10-CM | POA: Diagnosis not present

## 2019-09-21 DIAGNOSIS — Z23 Encounter for immunization: Secondary | ICD-10-CM | POA: Diagnosis not present

## 2019-09-21 DIAGNOSIS — S01412A Laceration without foreign body of left cheek and temporomandibular area, initial encounter: Secondary | ICD-10-CM | POA: Diagnosis not present

## 2019-09-24 ENCOUNTER — Other Ambulatory Visit: Payer: Self-pay

## 2019-09-24 DIAGNOSIS — E782 Mixed hyperlipidemia: Secondary | ICD-10-CM

## 2019-09-24 DIAGNOSIS — I10 Essential (primary) hypertension: Secondary | ICD-10-CM

## 2019-09-24 NOTE — Chronic Care Management (AMB) (Signed)
Chronic Care Management Pharmacy  Name: Alexandra [name]  MRN: [id number] DOB: 03/25/1941  Chief Complaint/ HPI  Alexandra [name],  79 y.o. , female presents for their Initial CCM visit with the clinical pharmacist via telephone due to COVID-19 Pandemic.  PCP : [name], MD  Their chronic conditions include: COPD, HTN, Reflux esophagitis, Osteoporosis, HLD.   Office Visits: 08/04/2019 - refilled fosamax. Routine visit.  04/03/2019 - Referral to ortho for hip pain. No anemia, kidney tests table stage 4, liver test normal.   Consult Visit: 09/2019 - Fall at home. Assessed at ED. Cut to cheek from glasses but no breaks. 08/11/2019 - Urology visit showed irritable bladder. Did procedure and gave levaquin x 1 day.  06/25/2019 - ortho visit for back pain. MRI ordered. Steroid injection.  06/19/2019 - ortho back x-ray. 05/25/2019 - Pulmonology visit.  05/21/2019 - No med changes. Recommended exercise and weight loss.  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 Medications: Outpatient Encounter Medications as of 09/25/2019  Medication Sig  . alendronate (FOSAMAX) 70 MG tablet Take 1 tablet (70 mg total) by mouth once a week. Take with a full glass of water on an empty stomach.  Marland Kitchen amLODipine (NORVASC) 10 MG tablet Take 10 mg by mouth every morning.  Marland Kitchen esomeprazole (NEXIUM) 40 MG capsule Take 40 mg by mouth daily as needed.   . formoterol (PERFOROMIST) 20 MCG/2ML nebulizer solution USE 1 VIAL  IN  NEBULIZER TWICE  DAILY - -Morning and Evening (Patient taking differently: Take 20 mcg by nebulization 2 (two) times daily. USE 1 VIAL  IN  NEBULIZER TWICE  DAILY - -Morning and Evening)  . revefenacin (YUPELRI) 175 MCG/3ML nebulizer solution Take 175 mcg by nebulization daily.  Marland Kitchen rOPINIRole (REQUIP) 2 MG tablet Take 2 mg by mouth at bedtime.  . simvastatin (ZOCOR) 40 MG tablet Take 40 mg by mouth at bedtime.    No  facility-administered encounter medications on file as of 09/25/2019.   Allergies  Allergen Reactions  . Contrast Media [Iodinated Diagnostic Agents] Hives, Itching and Rash  . Shellfish Allergy Nausea And Vomiting  . Shellfish-Derived Products Nausea And Vomiting   SDOH Screenings   Alcohol Screen:   . Last Alcohol Screening Score (AUDIT):   Depression (PHQ2-9): Low Risk   . PHQ-2 Score: 0  Financial Resource Strain:   . Difficulty of Paying Living Expenses:   Food Insecurity: No Food Insecurity  . Worried About Charity fundraiser in the Last Year: Never true  . Ran Out of Food in the Last Year: Never true  Housing:   . Last Housing Risk Score:   Physical Activity:   . Days of Exercise per Week:   . Minutes of Exercise per Session:   Social Connections:   . Frequency of Communication with Friends and Family:   . Frequency of Social Gatherings with Friends and Family:   . Attends Religious Services:   . Active Member of Clubs or Organizations:   . Attends Archivist Meetings:   Marland Kitchen Marital Status:   Stress:   . Feeling of Stress :   Tobacco Use: Medium Risk  . Smoking Tobacco Use: Former Smoker  . Smokeless Tobacco Use: Never Used  Transportation Needs: No Transportation Needs  . Lack of Transportation (Medical): No  . Lack of Transportation (Non-Medical): No    Current Diagnosis/Assessment:  Goals Addressed  This Visit's Progress   . Pharmacy Care Plan       CARE PLAN ENTRY  Current Barriers:  . Chronic Disease Management support, education, and care coordination needs related to HTN, HLD, and COPD  Pharmacist Clinical Goal(s):  . Ensure safety, efficacy, and affordability of medications . Recommend maintaining BP <130/80 mmHg . Goal Cholesterol: TC <200, LDL <100, TG <150 HDL >50 mg/dL    Interventions: . Comprehensive medication review performed. . Pharmacist coordinated refills for alendronate and ropinirole. Patient will pick up at  [facility] this weekend.    Patient Self Care Activities:  . Recommend engaging in moderate intensity exercise for 30 minute 5 times each week.  . Recommend following the Dash diet guidance of incorporating fruits, vegetables, whole-grains, low-fat dairy products, lean meats, legumes and nuts for optimal health. . Recommend keeping sodium intake less than 1500 mg/day.   Initial goal documentation        COPD     Eosinophil count:  No results found for: EOSPCT%                               Eos (Absolute):  Lab Results  Component Value Date/Time   EOSABS 0.0 08/04/2019 07:59 AM    Tobacco Status:  Social History   Tobacco Use  Smoking Status Former Smoker  . Packs/day: 1.00  . Years: 50.00  . Pack years: 50.00  . Types: Cigarettes  . Quit date: 06/11/2005  . Years since quitting: 14.2  Smokeless Tobacco Never Used    Patient has failed these meds in past: albuterol hfa inhaler, anoro ellipta Patient is currently controlled on the following medications: perforomist 20 mcg/30ml bid, yupleri Using maintenance inhaler regularly? No  We discussed:  Had pneumonia 10 years ago with oxygen sats in the 50%. She feels like the two breathing treatments she is on now is managing her symptoms well. She is pleased with her results.    Plan  Continue current medications  and  Hypertension   BP today is:  <140/90  Office blood pressures are  BP Readings from Last 3 Encounters:  08/04/19 132/74  05/21/19 (!) 146/84  04/21/18 122/88    Patient has failed these meds in the past: n/a Patient is currently controlled on the following medications: amlodipine 10 mg daily Patient checks BP at home weekly  Patient home BP readings are ranging: 120/80  We discussed diet and exercise extensively. Patient and her husband are working on much healthier eating habits. She is enjoying more fruits and vegetables. She is excited for fresher options this summer. .   Plan  Continue current  medications   Hyperlipidemia   Lipid Panel     Component Value Date/Time   CHOL 154 08/04/2019 0759   TRIG 111 08/04/2019 0759   HDL 54 08/04/2019 0759   CHOLHDL 2.9 08/04/2019 0759   LDLCALC 80 08/04/2019 0759   LABVLDL 20 08/04/2019 0759     The 10-year ASCVD risk score Mikey Bussing DC Jr., et al., 2013) is: 24.2%   Values used to calculate the score:     Age: 44 years     Sex: Female     Is Non-Hispanic African American: No     Diabetic: No     Tobacco smoker: No     Systolic Blood Pressure: 998 mmHg     Is BP treated: Yes     HDL Cholesterol: 54 mg/dL  Total Cholesterol: 154 mg/dL   Patient has failed these meds in past: n/a Patient is currently controlled on the following medications: simvastatin 40 mg daily  We discussed:  diet and exercise extensively. Patient understands healthier eating does a great job preventing risks.   Plan  Continue current medications  Osteoporosis   Patient has failed these meds in past: n/a Patient is currently controlled on the following medications: alendronate 70 mg weekly  We discussed:  Patient had a fall this week and has a fracture. She attributes her minor fracture to the fact that her Fosamax is working. She is sore and recovering at home.   Plan  Continue current medications  RLS   Patient has failed these meds in past: n/a Patient is currently controlled on the following medications: ropinirole 2 mg hs  We discussed:  Patient says the medication does a great job keeping her legs still at night. She needs a refill today. Pharmacist coordinated in office.   Plan  Continue current medications   GERD   Patient has failed these meds in past: none noted Patient is currently controlled on the following medications: esomeprazole 40 mg daily prn  We discussed:  diet and exercise extensively. Patient says spicy foods and bananas trigger her acid reflux to the point of vomiting. She avoids those.   Plan  Continue current  medications   Vaccines   Reviewed and discussed patient's vaccination history. Has had both COVID vaccines.   Immunization History  Administered Date(s) Administered  . Fluad Quad(high Dose 65+) 03/25/2019  . Influenza, High Dose Seasonal PF 03/27/2017, 04/01/2018  . Pneumococcal Conjugate-13 10/15/2013, 03/27/2017  . Pneumococcal-Unspecified 10/15/2013  . Zoster 11/23/2010    Plan  Recommended patient receive annual flu vaccine in office. Patient will discuss with Dr. Henrene Pastor about Shingrix.  Medication Management   Pt uses [facility] for all medications Weekly pill box Pt endorses 100% compliance  We discussed: Keeping medications organized. Patient is out of refills on alendronate and ropinirole. Pharmacist coordinated in office.   Plan  Recommend continuing to use pill box for good adherence.      Follow up: 6 months with Husband's visit.

## 2019-09-24 NOTE — Progress Notes (Signed)
Patient has been scheduled for an appointment on 09/25/2019 with Sherre Poot, PharmD.

## 2019-09-25 ENCOUNTER — Ambulatory Visit: Payer: Medicare Other

## 2019-09-25 ENCOUNTER — Other Ambulatory Visit: Payer: Self-pay

## 2019-09-25 DIAGNOSIS — N1831 Chronic kidney disease, stage 3a: Secondary | ICD-10-CM

## 2019-09-25 DIAGNOSIS — J449 Chronic obstructive pulmonary disease, unspecified: Secondary | ICD-10-CM

## 2019-09-25 DIAGNOSIS — M810 Age-related osteoporosis without current pathological fracture: Secondary | ICD-10-CM

## 2019-09-25 DIAGNOSIS — I10 Essential (primary) hypertension: Secondary | ICD-10-CM

## 2019-09-25 DIAGNOSIS — E782 Mixed hyperlipidemia: Secondary | ICD-10-CM

## 2019-09-25 MED ORDER — ROPINIROLE HCL 2 MG PO TABS: 2.0000 mg | ORAL_TABLET | Freq: Every day | ORAL | 3 refills | Status: DC

## 2019-09-25 MED ORDER — ALENDRONATE SODIUM 70 MG PO TABS: 70.0000 mg | ORAL_TABLET | ORAL | 3 refills | Status: DC

## 2019-09-25 NOTE — Patient Instructions (Addendum)
Visit Information  Thank you for your time discussing your medications. I look forward to working with you to achieve your health care goals. Below is a summary of what we talked about during our visit.   Goals Addressed            This Visit's Progress   . Pharmacy Care Plan       CARE PLAN ENTRY  Current Barriers:  . Chronic Disease Management support, education, and care coordination needs related to HTN, HLD, and COPD  Pharmacist Clinical Goal(s):  . Ensure safety, efficacy, and affordability of medications . Recommend maintaining BP <130/80 mmHg . Goal Cholesterol: TC <200, LDL <100, TG <150 HDL >50 mg/dL    Interventions: . Comprehensive medication review performed. . Pharmacist coordinated refills for alendronate and ropinirole. Patient will pick up at [facility] this weekend.    Patient Self Care Activities:  . Recommend engaging in moderate intensity exercise for 30 minute 5 times each week.  . Recommend following the Dash diet guidance of incorporating fruits, vegetables, whole-grains, low-fat dairy products, lean meats, legumes and nuts for optimal health. . Recommend keeping sodium intake less than 1500 mg/day.   Initial goal documentation        Ms. Flippen was given information about Chronic Care Management services today including:  1. CCM service includes personalized support from designated clinical staff supervised by her physician, including individualized plan of care and coordination with other care providers 2. 24/7 contact phone numbers for assistance for urgent and routine care needs. 3. Standard insurance, coinsurance, copays and deductibles apply for chronic care management only during months in which we provide at least 20 minutes of these services. Most insurances cover these services at 100%, however patients may be responsible for any copay, coinsurance and/or deductible if applicable. This service may help you avoid the need for more expensive  face-to-face services. 4. Only one practitioner may furnish and bill the service in a calendar month. 5. The patient may stop CCM services at any time (effective at the end of the month) by phone call to the office staff.  Patient agreed to services and verbal consent obtained.   Print copy of patient instructions provided.  Telephone follow up appointment with pharmacy team member scheduled for: 03/11/2020  Sherre Poot, PharmD Clinical Pharmacist [facility] [phone]  Food Basics for Chronic Kidney Disease When your kidneys are not working well, they cannot remove waste and excess substances from your blood as effectively as they did before. This can lead to a buildup and imbalance of these substances, which can worsen kidney damage and affect how your body functions. Certain foods lead to a buildup of these substances in the body. By changing your diet as recommended by your diet and nutrition specialist (dietitian) or health care provider, you could help prevent further kidney damage and delay or prevent the need for dialysis. What are tips for following this plan? General instructions   Work with your health care provider and dietitian to develop a meal plan that is right for you. Foods you can eat, limit, or avoid will be different for each person depending on the stage of kidney disease and any other existing health conditions.  Talk with your health care provider about whether you should take a vitamin and mineral supplement.  Use standard measuring cups and spoons to measure servings of foods. Use a kitchen scale to measure portions of protein foods.  If directed by your health care provider, avoid  drinking too much fluid. Measure and count all liquids, including water, ice, soups, flavored gelatin, and frozen desserts such as popsicles or ice cream. Reading food labels  Check the amount of sodium in foods. Choose foods that have less than 300 milligrams (mg) per  serving.  Check the ingredient list for phosphorus or potassium-based additives or preservatives.  Check the amount of saturated and trans fat. Limit or avoid these fats as told by your dietitian. Shopping  Avoid buying foods that are: ? Processed, frozen, or prepackaged. ? Calcium-enriched or fortified.  Do not buy foods that have salt or sodium listed among the first five ingredients.  Do not buy canned vegetables. Cooking  Replace animal proteins, such as meat, fish, eggs, or dairy, with plant proteins from beans, nuts, and soy. ? Use soy milk instead of cow's milk. ? Add beans or tofu to soups, casseroles, or pasta dishes instead of meat.  Soak vegetables, such as potatoes, before cooking to reduce potassium. To do this: ? Peel and cut into small pieces. ? Soak in warm water for at least 2 hours. For every 1 cup of vegetables, use 10 cups of water. ? Drain and rinse with warm water. ? Boil for at least 5 minutes. Meal planning  Limit the amount of protein from plant and animal sources you eat each day.  Do not add salt to food when cooking or before eating.  Eat meals and snacks at around the same time each day. If you have diabetes:  If you have diabetes (diabetes mellitus) and chronic kidney disease, it is important to keep your blood glucose in the target range recommended by your health care provider. Follow your diabetes management plan. This may include: ? Checking your blood glucose regularly. ? Taking oral medicines, insulin, or both. ? Exercising for at least 30 minutes on 5 or more days each week, or as told by your health care provider. ? Tracking how many servings of carbohydrates you eat at each meal.  You may be given specific guidelines on how much of certain foods and nutrients you may eat, depending on your stage of kidney disease and whether you have high blood pressure (hypertension). Follow your meal plan as told by your dietitian. What nutrients  should be limited? The items listed are not a complete list. Talk with your dietitian about what dietary choices are best for you. Potassium Potassium affects how steadily your heart beats. If too much potassium builds up in your blood, it can cause an irregular heartbeat or even a heart attack. You may need to eat less potassium, depending on your blood potassium levels and the stage of kidney disease. Talk to your dietitian about how much potassium you may have each day. You may need to limit or avoid foods that are high in potassium, such as:  Milk and soy milk.  Fruits, such as bananas, papaya, apricots, nectarines, melon, prunes, raisins, kiwi, and oranges.  Vegetables, such as potatoes, sweet potatoes, yams, tomatoes, leafy greens, beets, okra, avocado, pumpkin, and winter squash.  White and lima beans. Phosphorus Phosphorus is a mineral found in your bones. A balance between calcium and phosphorous is needed to build and maintain healthy bones. Too much phosphorus pulls calcium from your bones. This can make your bones weak and more likely to break. Too much phosphorus can also make your skin itch. You may need to eat less phosphorus depending on your blood phosphorus levels and the stage of kidney disease. Talk to  your dietitian about how much potassium you may have each day. You may need to take medicine to lower your blood phosphorus levels if diet changes do not help. You may need to limit or avoid foods that are high in phosphorus, such as:  Milk and dairy products.  Dried beans and peas.  Tofu, soy milk, and other soy-based meat replacements.  Colas.  Nuts and peanut butter.  Meat, poultry, and fish.  Bran cereals and oatmeals. Protein Protein helps you to make and keep muscle. It also helps in the repair of your body's cells and tissues. One of the natural breakdown products of protein is a waste product called urea. When your kidneys are not working properly, they  cannot remove wastes, such as urea, like they did before you developed chronic kidney disease. Reducing how much protein you eat can help prevent a buildup of urea in your blood. Depending on your stage of kidney disease, you may need to limit foods that are high in protein. Sources of animal protein include:  Meat (all types).  Fish and seafood.  Poultry.  Eggs.  Dairy. Other protein foods include:  Beans and legumes.  Nuts and nut butter.  Soy and tofu. Sodium Sodium, which is found in salt, helps maintain a healthy balance of fluids in your body. Too much sodium can increase your blood pressure and have a negative effect on the function of your heart and lungs. Too much sodium can also cause your body to retain too much fluid, making your kidneys work harder. Most people should have less than 2,300 milligrams (mg) of sodium each day. If you have hypertension, you may need to limit your sodium to 1,500 mg each day. Talk to your dietitian about how much sodium you may have each day. You may need to limit or avoid foods that are high in sodium, such as:  Salt seasonings.  Soy sauce.  Cured and processed meats.  Salted crackers and snack foods.  Fast food.  Canned soups and most canned foods.  Pickled foods.  Vegetable juice.  Boxed mixes or ready-to-eat boxed meals and side dishes.  Bottled dressings, sauces, and marinades. Summary  Chronic kidney disease can lead to a buildup and imbalance of waste and excess substances in the body. Certain foods lead to a buildup of these substances. By adjusting your intake of these foods, you could help prevent more kidney damage and delay or prevent the need for dialysis.  Food adjustments are different for each person with chronic kidney disease. Work with a dietitian to set up nutrient goals and a meal plan that is right for you.  If you have diabetes and chronic kidney disease, it is important to keep your blood glucose in the  target range recommended by your health care provider. This information is not intended to replace advice given to you by your health care provider. Make sure you discuss any questions you have with your health care provider. Document Revised: 09/18/2018 Document Reviewed: 05/23/2016 Elsevier Patient Education  2020 [employer].

## 2019-09-29 DIAGNOSIS — S42253A Displaced fracture of greater tuberosity of unspecified humerus, initial encounter for closed fracture: Secondary | ICD-10-CM | POA: Diagnosis not present

## 2019-10-07 ENCOUNTER — Other Ambulatory Visit: Payer: Self-pay

## 2019-10-07 ENCOUNTER — Other Ambulatory Visit: Payer: Self-pay | Admitting: Legal Medicine

## 2019-10-07 DIAGNOSIS — M25612 Stiffness of left shoulder, not elsewhere classified: Secondary | ICD-10-CM | POA: Diagnosis not present

## 2019-10-07 DIAGNOSIS — M25512 Pain in left shoulder: Secondary | ICD-10-CM | POA: Diagnosis not present

## 2019-10-07 DIAGNOSIS — M6281 Muscle weakness (generalized): Secondary | ICD-10-CM | POA: Diagnosis not present

## 2019-10-07 MED ORDER — ROPINIROLE HCL 2 MG PO TABS: 2.0000 mg | ORAL_TABLET | Freq: Every evening | ORAL | 6 refills | Status: DC

## 2019-10-12 DIAGNOSIS — M25512 Pain in left shoulder: Secondary | ICD-10-CM | POA: Diagnosis not present

## 2019-10-12 DIAGNOSIS — M25612 Stiffness of left shoulder, not elsewhere classified: Secondary | ICD-10-CM | POA: Diagnosis not present

## 2019-10-12 DIAGNOSIS — M6281 Muscle weakness (generalized): Secondary | ICD-10-CM | POA: Diagnosis not present

## 2019-10-14 DIAGNOSIS — M25612 Stiffness of left shoulder, not elsewhere classified: Secondary | ICD-10-CM | POA: Diagnosis not present

## 2019-10-14 DIAGNOSIS — M25512 Pain in left shoulder: Secondary | ICD-10-CM | POA: Diagnosis not present

## 2019-10-14 DIAGNOSIS — M6281 Muscle weakness (generalized): Secondary | ICD-10-CM | POA: Diagnosis not present

## 2019-10-20 DIAGNOSIS — M6281 Muscle weakness (generalized): Secondary | ICD-10-CM | POA: Diagnosis not present

## 2019-10-20 DIAGNOSIS — M25612 Stiffness of left shoulder, not elsewhere classified: Secondary | ICD-10-CM | POA: Diagnosis not present

## 2019-10-20 DIAGNOSIS — M25512 Pain in left shoulder: Secondary | ICD-10-CM | POA: Diagnosis not present

## 2019-10-22 DIAGNOSIS — M25612 Stiffness of left shoulder, not elsewhere classified: Secondary | ICD-10-CM | POA: Diagnosis not present

## 2019-10-22 DIAGNOSIS — M6281 Muscle weakness (generalized): Secondary | ICD-10-CM | POA: Diagnosis not present

## 2019-10-22 DIAGNOSIS — M25512 Pain in left shoulder: Secondary | ICD-10-CM | POA: Diagnosis not present

## 2019-10-26 DIAGNOSIS — M6281 Muscle weakness (generalized): Secondary | ICD-10-CM | POA: Diagnosis not present

## 2019-10-26 DIAGNOSIS — M25612 Stiffness of left shoulder, not elsewhere classified: Secondary | ICD-10-CM | POA: Diagnosis not present

## 2019-10-26 DIAGNOSIS — M25512 Pain in left shoulder: Secondary | ICD-10-CM | POA: Diagnosis not present

## 2019-10-27 DIAGNOSIS — M25512 Pain in left shoulder: Secondary | ICD-10-CM | POA: Diagnosis not present

## 2019-10-29 DIAGNOSIS — M25512 Pain in left shoulder: Secondary | ICD-10-CM | POA: Diagnosis not present

## 2019-10-29 DIAGNOSIS — M25612 Stiffness of left shoulder, not elsewhere classified: Secondary | ICD-10-CM | POA: Diagnosis not present

## 2019-10-29 DIAGNOSIS — M6281 Muscle weakness (generalized): Secondary | ICD-10-CM | POA: Diagnosis not present

## 2019-10-30 DIAGNOSIS — M25511 Pain in right shoulder: Secondary | ICD-10-CM | POA: Diagnosis not present

## 2019-10-30 DIAGNOSIS — Z1379 Encounter for other screening for genetic and chromosomal anomalies: Secondary | ICD-10-CM | POA: Diagnosis not present

## 2019-11-02 DIAGNOSIS — M25612 Stiffness of left shoulder, not elsewhere classified: Secondary | ICD-10-CM | POA: Diagnosis not present

## 2019-11-02 DIAGNOSIS — M6281 Muscle weakness (generalized): Secondary | ICD-10-CM | POA: Diagnosis not present

## 2019-11-02 DIAGNOSIS — M25512 Pain in left shoulder: Secondary | ICD-10-CM | POA: Diagnosis not present

## 2019-11-04 DIAGNOSIS — M6281 Muscle weakness (generalized): Secondary | ICD-10-CM | POA: Diagnosis not present

## 2019-11-04 DIAGNOSIS — M25512 Pain in left shoulder: Secondary | ICD-10-CM | POA: Diagnosis not present

## 2019-11-04 DIAGNOSIS — M25612 Stiffness of left shoulder, not elsewhere classified: Secondary | ICD-10-CM | POA: Diagnosis not present

## 2019-11-05 DIAGNOSIS — M25512 Pain in left shoulder: Secondary | ICD-10-CM | POA: Diagnosis not present

## 2019-11-10 DIAGNOSIS — M6281 Muscle weakness (generalized): Secondary | ICD-10-CM | POA: Diagnosis not present

## 2019-11-10 DIAGNOSIS — M25612 Stiffness of left shoulder, not elsewhere classified: Secondary | ICD-10-CM | POA: Diagnosis not present

## 2019-11-10 DIAGNOSIS — M25512 Pain in left shoulder: Secondary | ICD-10-CM | POA: Diagnosis not present

## 2019-11-11 DIAGNOSIS — C672 Malignant neoplasm of lateral wall of bladder: Secondary | ICD-10-CM | POA: Diagnosis not present

## 2019-11-11 DIAGNOSIS — Z1159 Encounter for screening for other viral diseases: Secondary | ICD-10-CM | POA: Diagnosis not present

## 2019-11-11 DIAGNOSIS — N2889 Other specified disorders of kidney and ureter: Secondary | ICD-10-CM | POA: Diagnosis not present

## 2019-11-12 DIAGNOSIS — J449 Chronic obstructive pulmonary disease, unspecified: Secondary | ICD-10-CM | POA: Diagnosis not present

## 2019-11-12 DIAGNOSIS — K219 Gastro-esophageal reflux disease without esophagitis: Secondary | ICD-10-CM | POA: Diagnosis not present

## 2019-11-12 DIAGNOSIS — M25612 Stiffness of left shoulder, not elsewhere classified: Secondary | ICD-10-CM | POA: Diagnosis not present

## 2019-11-12 DIAGNOSIS — D494 Neoplasm of unspecified behavior of bladder: Secondary | ICD-10-CM | POA: Diagnosis not present

## 2019-11-12 DIAGNOSIS — M6281 Muscle weakness (generalized): Secondary | ICD-10-CM | POA: Diagnosis not present

## 2019-11-12 DIAGNOSIS — C672 Malignant neoplasm of lateral wall of bladder: Secondary | ICD-10-CM | POA: Diagnosis not present

## 2019-11-12 DIAGNOSIS — C679 Malignant neoplasm of bladder, unspecified: Secondary | ICD-10-CM | POA: Diagnosis not present

## 2019-11-12 DIAGNOSIS — M25512 Pain in left shoulder: Secondary | ICD-10-CM | POA: Diagnosis not present

## 2019-11-12 DIAGNOSIS — E785 Hyperlipidemia, unspecified: Secondary | ICD-10-CM | POA: Diagnosis not present

## 2019-11-12 DIAGNOSIS — Z79891 Long term (current) use of opiate analgesic: Secondary | ICD-10-CM | POA: Diagnosis not present

## 2019-11-12 DIAGNOSIS — I10 Essential (primary) hypertension: Secondary | ICD-10-CM | POA: Diagnosis not present

## 2019-11-12 DIAGNOSIS — Z79899 Other long term (current) drug therapy: Secondary | ICD-10-CM | POA: Diagnosis not present

## 2019-11-12 DIAGNOSIS — Z87891 Personal history of nicotine dependence: Secondary | ICD-10-CM | POA: Diagnosis not present

## 2019-11-18 DIAGNOSIS — C672 Malignant neoplasm of lateral wall of bladder: Secondary | ICD-10-CM | POA: Diagnosis not present

## 2019-11-18 DIAGNOSIS — N2889 Other specified disorders of kidney and ureter: Secondary | ICD-10-CM | POA: Diagnosis not present

## 2019-11-20 DIAGNOSIS — Z1159 Encounter for screening for other viral diseases: Secondary | ICD-10-CM | POA: Diagnosis not present

## 2019-11-20 DIAGNOSIS — Z1152 Encounter for screening for COVID-19: Secondary | ICD-10-CM | POA: Diagnosis not present

## 2019-11-24 ENCOUNTER — Encounter: Payer: Self-pay | Admitting: Legal Medicine

## 2019-11-24 ENCOUNTER — Ambulatory Visit (INDEPENDENT_AMBULATORY_CARE_PROVIDER_SITE_OTHER): Payer: Medicare Other | Admitting: Legal Medicine

## 2019-11-24 ENCOUNTER — Other Ambulatory Visit: Payer: Self-pay

## 2019-11-24 VITALS — BP 144/80 | HR 88 | Temp 97.0°F | Resp 17 | Ht 61.2 in | Wt 163.2 lb

## 2019-11-24 DIAGNOSIS — J449 Chronic obstructive pulmonary disease, unspecified: Secondary | ICD-10-CM

## 2019-11-24 DIAGNOSIS — Z01818 Encounter for other preprocedural examination: Secondary | ICD-10-CM

## 2019-11-24 DIAGNOSIS — I10 Essential (primary) hypertension: Secondary | ICD-10-CM | POA: Diagnosis not present

## 2019-11-24 DIAGNOSIS — E782 Mixed hyperlipidemia: Secondary | ICD-10-CM | POA: Diagnosis not present

## 2019-11-24 LAB — PULMONARY FUNCTION TEST
FEV1/FVC: 67 %
FEV1: 1.23 L
FVC: 1.47 L

## 2019-11-24 NOTE — Progress Notes (Signed)
Established Patient Office Visit  Subjective:  Patient ID: Alexandra [name], female    DOB: [birth year]  Age: 79 y.o. MRN: [id number]  CC:  Chief Complaint  Patient presents with  . Shoulder Pain    Right shoulder pain  . Surgery Clearance    Patient has right shoulder rotator cuff repair on 11/25/2019 at [place] [facility]    HPI Alexandra [name] presents for preoperative physical.  Patient is being seen for pre-operative exam for open rotator cuff repair on right.  She fell 8 weeks ago and tore right rotator cuff.  She has fracture humerus left shoulder that is improved. NO history of CHF or MI, no strokes or renal failure.  Mild COPD stable, no bleeding diathesis.  Patient presents for follow up of hypertension.  Patient tolerating amlodipine well with side effects.  Patient was diagnosed with hypertension 2010 so has been treated for hypertension for 10 years.Patient is working on maintaining diet and exercise regimen and follows up as directed. 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  No calcium.  She has hyperparathyroidism that is stable .  Patient presents with diagnosis of COPD.  It is not secondary to prolonged asthma.  Diagnosis 10  Treatment includes performist, yuerli.  The diagnosis has not been hospitalized for this diagnosis. Last na.  Patient is comliant with regular use of medicines..  Past Medical History:  Diagnosis Date  . Arthritis   . 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 GERD (gastroesophageal reflux disease)    hx of none recent  . Hyperparathyroidism ([place])   . Hypertension   . Osteoporosis   . Restless legs syndrome     Past Surgical History:  Procedure Laterality Date  . ABDOMINAL HYSTERECTOMY     complete  . BACK SURGERY  2016   lower L 4 to L 5  . bladder tack and uterus lift  10/10/2015  . IR RADIOLOGIST EVAL & MGMT  12/03/2018  . IR RADIOLOGIST EVAL & MGMT  12/31/2018  . left nephrectomy  1999  .  PARATHYROIDECTOMY Left 07/02/2016   Procedure: LEFT INFERIOR PARATHYROIDECTOMY;  Surgeon: Armandina Gemma, MD;  Location: [facility];  Service: General;  Laterality: Left;    Family History  Problem Relation Age of Onset  . Emphysema Mother     Social History   Socioeconomic History  . Marital status: Married    Spouse name: Not on file  . Number of children: Not on file  . Years of education: Not on file  . Highest education level: Not on file  Occupational History  . Not on file  Tobacco Use  . Smoking status: Former Smoker    Packs/day: 1.00    Years: 50.00    Pack years: 50.00    Types: Cigarettes    Quit date: 06/11/2005    Years since quitting: 14.4  . Smokeless tobacco: Never Used  Vaping Use  . Vaping Use: Never used  Substance and Sexual Activity  . Alcohol use: No  . Drug use: No  . Sexual activity: Yes    Partners: Male  Other Topics Concern  . Not on file  Social History Narrative  . Not on file   Social Determinants of Health   Financial Resource Strain:   . Difficulty of Paying Living Expenses:   Food Insecurity: No Food Insecurity  . Worried About Charity fundraiser in the Last Year: Never true  . Ran  Out of Food in the Last Year: Never true  Transportation Needs: No Transportation Needs  . Lack of Transportation (Medical): No  . Lack of Transportation (Non-Medical): No  Physical Activity:   . Days of Exercise per Week:   . Minutes of Exercise per Session:   Stress:   . Feeling of Stress :   Social Connections:   . Frequency of Communication with Friends and Family:   . Frequency of Social Gatherings with Friends and Family:   . Attends Religious Services:   . Active Member of Clubs or Organizations:   . Attends Archivist Meetings:   Marland Kitchen Marital Status:   Intimate Partner Violence:   . Fear of Current or Ex-Partner:   . Emotionally Abused:   Marland Kitchen Physically Abused:   . Sexually Abused:     Outpatient Medications Prior to Visit    Medication Sig Dispense Refill  . alendronate (FOSAMAX) 70 MG tablet Take 1 tablet (70 mg total) by mouth once a week. Take with a full glass of water on an empty stomach. 12 tablet 3  . amLODipine (NORVASC) 10 MG tablet Take 10 mg by mouth every morning.    Marland Kitchen esomeprazole (NEXIUM) 40 MG capsule Take 40 mg by mouth daily as needed.     . formoterol (PERFOROMIST) 20 MCG/2ML nebulizer solution USE 1 VIAL  IN  NEBULIZER TWICE  DAILY - -Morning and Evening (Patient taking differently: Take 20 mcg by nebulization 2 (two) times daily. USE 1 VIAL  IN  NEBULIZER TWICE  DAILY - -Morning and Evening) 60 mL 11  . revefenacin (YUPELRI) 175 MCG/3ML nebulizer solution Take 175 mcg by nebulization daily.    Marland Kitchen rOPINIRole (REQUIP) 2 MG tablet Take 1 tablet (2 mg total) by mouth at bedtime. 30 tablet 6  . simvastatin (ZOCOR) 40 MG tablet TAKE 1 TABLET BY MOUTH EVERY DAY 90 tablet 2   No facility-administered medications prior to visit.    Allergies  Allergen Reactions  . Contrast Media [Iodinated Diagnostic Agents] Hives, Itching and Rash  . Shellfish Allergy Nausea And Vomiting  . Shellfish-Derived Products Nausea And Vomiting    ROS Review of Systems  Constitutional: Negative.   HENT: Negative.   Eyes: Negative.   Respiratory: Negative.   Cardiovascular: Negative.   Gastrointestinal: Negative.   Endocrine: Negative.   Genitourinary: Negative.        Nocturia x 5, no incontinence  Musculoskeletal: Positive for arthralgias.  Skin: Negative.   Neurological: Negative.   Psychiatric/Behavioral: Negative.       Objective:    Physical Exam Vitals reviewed.  Constitutional:      Appearance: Normal appearance.  HENT:     Head: Normocephalic and atraumatic.     Right Ear: Tympanic membrane normal.     Left Ear: Tympanic membrane normal.     Nose: Nose normal.     Mouth/Throat:     Mouth: Mucous membranes are dry.  Cardiovascular:     Rate and Rhythm: Normal rate and regular rhythm.      Pulses: Normal pulses.     Heart sounds: Normal heart sounds.  Pulmonary:     Effort: Pulmonary effort is normal.     Breath sounds: Normal breath sounds.  Abdominal:     General: Abdomen is flat.     Palpations: Abdomen is soft.  Musculoskeletal:     Right shoulder: Tenderness present. Decreased range of motion. Decreased strength.     Left shoulder: Tenderness present. Decreased range of  motion. Decreased strength.     Cervical back: Neck supple.     Comments: Right abduction 60 degrees, flexion 60 degrees Left abduction 60 degrees, flexion 6 degrees  Skin:    General: Skin is warm.  Neurological:     General: No focal deficit present.     Mental Status: She is alert.     BP (!) 144/80 (BP Location: Left Arm, Patient Position: Sitting)   Pulse 88   Temp (!) 97 F (36.1 C) (Temporal)   Resp 17   Ht 5' 1.2" (1.554 m)   Wt 163 lb 3.2 oz (74 kg)   SpO2 97%   BMI 30.64 kg/m  Wt Readings from Last 3 Encounters:  11/24/19 163 lb 3.2 oz (74 kg)  08/04/19 163 lb 6.4 oz (74.1 kg)  05/21/19 162 lb (73.5 kg)  PFTs: FVC 93%, FEV1 79%, FEV1.FVC 83%, PEF 68%  EKG: NSR 79, PR 142, QRS 80, QTC 433, axis 62 degrees Health Maintenance Due  Topic Date Due  . Hepatitis C Screening  Never done  . COVID-19 Vaccine (1) Never done  . TETANUS/TDAP  Never done  . DEXA SCAN  Never done  . PNA vac Low Risk Adult (2 of 2 - PPSV23) 03/27/2018    There are no preventive care reminders to display for this patient.  No results found for: TSH Lab Results  Component Value Date   WBC 17.0 (H) 08/04/2019   HGB 12.0 08/04/2019   HCT 36.7 08/04/2019   MCV 91 08/04/2019   PLT 440 08/04/2019   Lab Results  Component Value Date   NA 140 08/04/2019   K 4.8 08/04/2019   CO2 16 (L) 08/04/2019   GLUCOSE 108 (H) 08/04/2019   BUN 32 (H) 08/04/2019   CREATININE 1.90 (H) 08/04/2019   BILITOT 0.2 08/04/2019   ALKPHOS 94 08/04/2019   AST 17 08/04/2019   ALT 11 08/04/2019   PROT 6.6 08/04/2019    ALBUMIN 4.3 08/04/2019   CALCIUM 9.1 08/04/2019   ANIONGAP 5 06/26/2016   Lab Results  Component Value Date   CHOL 154 08/04/2019   Lab Results  Component Value Date   HDL 54 08/04/2019   Lab Results  Component Value Date   LDLCALC 80 08/04/2019   Lab Results  Component Value Date   TRIG 111 08/04/2019   Lab Results  Component Value Date   CHOLHDL 2.9 08/04/2019   No results found for: HGBA1C    Assessment & Plan:   Problem List Items Addressed This Visit      Cardiovascular and Mediastinum   Essential hypertension   Relevant Orders   CBC with Differential/Platelet   Comprehensive metabolic panel     Other   Hyperlipemia   Relevant Orders   Lipid panel    Other Visit Diagnoses    Preoperative clearance    -  Primary   Relevant Orders   EKG 12-Lead   CBC with Differential/Platelet   Comprehensive metabolic panel   Lipid panel    Patient cleared for surgery but need to follow O2 sats.  Hypertension: An individual hypertension care plan was established and reinforced today.  The patient's status was assessed using clinical findings on exam and labs or diagnostic tests. The patient's success at meeting treatment goals on disease specific evidence-based guidelines and found to be well controlled. SELF MANAGEMENT: The patient and I together assessed ways to personally work towards obtaining the recommended goals. RECOMMENDATIONS: avoid decongestants found in common cold  remedies, decrease consumption of alcohol, perform routine monitoring of BP with home BP cuff, exercise, reduction of dietary salt, take medicines as prescribed, try not to miss doses and quit smoking.  Regular exercise and maintaining a healthy weight is needed.  Stress reduction may help. A CLINICAL SUMMARY including written plan identify barriers to care unique to individual due to social or financial issues.  We attempt to mutually creat solutions for individual and family  understanding.  COPD: An individualize plan was formulated for care of COPD.  Treatment is evidence based.  She will continue on inhalers, avoid smoking and smoke.  Regular exercise with help with dyspnea. Routine follow ups and medication compliance is needed. See PFTs above.  Hyperparathyroidism: She is stable and has not required surgical excision.  Osteoporosis: AN INDIVIDUAL CARE PLAN was established and reinforced today.  The patient's status was assessed using clinical findings on exam, labs, and other diagnostic testing. Patient's success at meeting treatment goals based on disease specific evidence-bassed guidelines and found to be in fair control. RECOMMENDATIONS include stopping alendronate since she has been on this for > 5 years.No orders of the defined types were placed in this encounter.  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  The patient's status was assessed using clinical findings on exam, labs, and other diagnostic testing. Patient's success at meeting treatment goals based on disease specific evidence-bassed guidelines and found to be in good control. RECOMMENDATIONS include maintaining present medicines and treatment.  Follow-up: Return in about 4 months (around 03/25/2020) for fasting.    Reinaldo Meeker, MD

## 2019-11-25 DIAGNOSIS — E7800 Pure hypercholesterolemia, unspecified: Secondary | ICD-10-CM | POA: Diagnosis not present

## 2019-11-25 DIAGNOSIS — Z85528 Personal history of other malignant neoplasm of kidney: Secondary | ICD-10-CM | POA: Diagnosis not present

## 2019-11-25 DIAGNOSIS — M75101 Unspecified rotator cuff tear or rupture of right shoulder, not specified as traumatic: Secondary | ICD-10-CM | POA: Diagnosis not present

## 2019-11-25 DIAGNOSIS — K219 Gastro-esophageal reflux disease without esophagitis: Secondary | ICD-10-CM | POA: Diagnosis not present

## 2019-11-25 DIAGNOSIS — M545 Low back pain: Secondary | ICD-10-CM | POA: Diagnosis not present

## 2019-11-25 DIAGNOSIS — J449 Chronic obstructive pulmonary disease, unspecified: Secondary | ICD-10-CM | POA: Diagnosis not present

## 2019-11-25 DIAGNOSIS — M7540 Impingement syndrome of unspecified shoulder: Secondary | ICD-10-CM | POA: Diagnosis not present

## 2019-11-25 DIAGNOSIS — Z981 Arthrodesis status: Secondary | ICD-10-CM | POA: Diagnosis not present

## 2019-11-25 DIAGNOSIS — Z905 Acquired absence of kidney: Secondary | ICD-10-CM | POA: Diagnosis not present

## 2019-11-25 DIAGNOSIS — M12811 Other specific arthropathies, not elsewhere classified, right shoulder: Secondary | ICD-10-CM | POA: Diagnosis not present

## 2019-11-25 DIAGNOSIS — Z87891 Personal history of nicotine dependence: Secondary | ICD-10-CM | POA: Diagnosis not present

## 2019-11-25 DIAGNOSIS — I10 Essential (primary) hypertension: Secondary | ICD-10-CM | POA: Diagnosis not present

## 2019-11-25 LAB — LIPID PANEL
Chol/HDL Ratio: 3.2 ratio (ref 0.0–4.4)
Cholesterol, Total: 158 mg/dL (ref 100–199)
HDL: 49 mg/dL (ref >39)
LDL Chol Calc (NIH): 86 mg/dL (ref 0–99)
Triglycerides: 130 mg/dL (ref 0–149)
VLDL Cholesterol Cal: 23 mg/dL (ref 5–40)

## 2019-11-25 LAB — CBC WITH DIFFERENTIAL/PLATELET
Basophils Absolute: 0.1 10*3/uL (ref 0.0–0.2)
Basos: 1 %
EOS (ABSOLUTE): 0.2 10*3/uL (ref 0.0–0.4)
Eos: 2 %
Hematocrit: 36.6 % (ref 34.0–46.6)
Hemoglobin: 12 g/dL (ref 11.1–15.9)
Immature Grans (Abs): 0.1 10*3/uL (ref 0.0–0.1)
Immature Granulocytes: 1 %
Lymphocytes Absolute: 2.6 10*3/uL (ref 0.7–3.1)
Lymphs: 22 %
MCH: 30.5 pg (ref 26.6–33.0)
MCHC: 32.8 g/dL (ref 31.5–35.7)
MCV: 93 fL (ref 79–97)
Monocytes Absolute: 0.9 10*3/uL (ref 0.1–0.9)
Monocytes: 8 %
Neutrophils Absolute: 7.6 10*3/uL — ABNORMAL HIGH (ref 1.4–7.0)
Neutrophils: 66 %
Platelets: 419 10*3/uL (ref 150–450)
RBC: 3.94 x10E6/uL (ref 3.77–5.28)
RDW: 13.5 % (ref 11.7–15.4)
WBC: 11.6 10*3/uL — ABNORMAL HIGH (ref 3.4–10.8)

## 2019-11-25 LAB — COMPREHENSIVE METABOLIC PANEL
ALT: 12 IU/L (ref 0–32)
AST: 16 IU/L (ref 0–40)
Albumin/Globulin Ratio: 1.6 (ref 1.2–2.2)
Albumin: 4 g/dL (ref 3.7–4.7)
Alkaline Phosphatase: 101 IU/L (ref 48–121)
BUN/Creatinine Ratio: 16 (ref 12–28)
BUN: 27 mg/dL (ref 8–27)
Bilirubin Total: 0.4 mg/dL (ref 0.0–1.2)
CO2: 19 mmol/L — ABNORMAL LOW (ref 20–29)
Calcium: 9.2 mg/dL (ref 8.7–10.3)
Chloride: 103 mmol/L (ref 96–106)
Creatinine, Ser: 1.68 mg/dL — ABNORMAL HIGH (ref 0.57–1.00)
GFR calc Af Amer: 34 mL/min/{1.73_m2} — ABNORMAL LOW (ref >59)
GFR calc non Af Amer: 29 mL/min/{1.73_m2} — ABNORMAL LOW (ref >59)
Globulin, Total: 2.5 g/dL (ref 1.5–4.5)
Glucose: 102 mg/dL — ABNORMAL HIGH (ref 65–99)
Potassium: 4.7 mmol/L (ref 3.5–5.2)
Sodium: 137 mmol/L (ref 134–144)
Total Protein: 6.5 g/dL (ref 6.0–8.5)

## 2019-11-25 LAB — CARDIOVASCULAR RISK ASSESSMENT

## 2019-11-25 NOTE — Progress Notes (Signed)
Wbc elevated- chronic, no anemia, glucose 102. Kidney tests stable consider nephrology consult, liver tests ok, Cholesterol normal- recheck BP at home or in 2 weeks lp

## 2019-12-02 ENCOUNTER — Ambulatory Visit: Payer: Medicare Other | Admitting: Legal Medicine

## 2019-12-08 ENCOUNTER — Ambulatory Visit: Payer: Medicare Other

## 2019-12-08 ENCOUNTER — Other Ambulatory Visit: Payer: Self-pay

## 2019-12-08 VITALS — BP 150/70

## 2019-12-08 DIAGNOSIS — I10 Essential (primary) hypertension: Secondary | ICD-10-CM

## 2019-12-08 NOTE — Progress Notes (Signed)
Patient is here for Blood Pressure check. Her Blood pressure is 150/70. She states that she did not take her blood pressure this morning. She denied any symptoms. Patient has another appointment on 12/10/2019 at 8:15 am to recheck her blood pressure. Dr Henrene Pastor recommended to write down her blood pressure and bring it on the day of her appointment.

## 2019-12-10 ENCOUNTER — Other Ambulatory Visit: Payer: Self-pay

## 2019-12-10 ENCOUNTER — Ambulatory Visit (INDEPENDENT_AMBULATORY_CARE_PROVIDER_SITE_OTHER): Payer: Medicare Other

## 2019-12-10 VITALS — BP 138/72 | HR 94

## 2019-12-10 DIAGNOSIS — I10 Essential (primary) hypertension: Secondary | ICD-10-CM

## 2019-12-10 HISTORY — PX: SHOULDER OPEN ROTATOR CUFF REPAIR: SHX2407

## 2019-12-10 NOTE — Progress Notes (Signed)
      Alexandra [name] came in today for a blood pressure check.  She states that it was elevated at an office visit with another provider and wanted it checked here.  She feels that it may have been elevated due to pain in her arm in relation to a healing fracture.  She brought in her home BP readings for the past three days which were:  140/67 137/72 124/70 138/74  Her blood pressure reading in the office today is 138/72 with a heart rate of 94.  Dr Henrene Pastor was notified and no changes were made.  Alexandra [name] was advised to continue monitoring her home BP and notify the office if she sees an upward trend or develops any symptoms.  Shelle Iron, LPN 82/88/33 74:45 AM

## 2019-12-15 DIAGNOSIS — M25511 Pain in right shoulder: Secondary | ICD-10-CM | POA: Diagnosis not present

## 2019-12-15 DIAGNOSIS — M6281 Muscle weakness (generalized): Secondary | ICD-10-CM | POA: Diagnosis not present

## 2019-12-15 DIAGNOSIS — M25611 Stiffness of right shoulder, not elsewhere classified: Secondary | ICD-10-CM | POA: Diagnosis not present

## 2019-12-17 DIAGNOSIS — M25511 Pain in right shoulder: Secondary | ICD-10-CM | POA: Diagnosis not present

## 2019-12-17 DIAGNOSIS — M25611 Stiffness of right shoulder, not elsewhere classified: Secondary | ICD-10-CM | POA: Diagnosis not present

## 2019-12-17 DIAGNOSIS — M6281 Muscle weakness (generalized): Secondary | ICD-10-CM | POA: Diagnosis not present

## 2019-12-21 DIAGNOSIS — M25511 Pain in right shoulder: Secondary | ICD-10-CM | POA: Diagnosis not present

## 2019-12-21 DIAGNOSIS — M25611 Stiffness of right shoulder, not elsewhere classified: Secondary | ICD-10-CM | POA: Diagnosis not present

## 2019-12-21 DIAGNOSIS — M6281 Muscle weakness (generalized): Secondary | ICD-10-CM | POA: Diagnosis not present

## 2019-12-24 DIAGNOSIS — M6281 Muscle weakness (generalized): Secondary | ICD-10-CM | POA: Diagnosis not present

## 2019-12-24 DIAGNOSIS — M25611 Stiffness of right shoulder, not elsewhere classified: Secondary | ICD-10-CM | POA: Diagnosis not present

## 2019-12-24 DIAGNOSIS — M25511 Pain in right shoulder: Secondary | ICD-10-CM | POA: Diagnosis not present

## 2019-12-29 DIAGNOSIS — M25611 Stiffness of right shoulder, not elsewhere classified: Secondary | ICD-10-CM | POA: Diagnosis not present

## 2019-12-29 DIAGNOSIS — M6281 Muscle weakness (generalized): Secondary | ICD-10-CM | POA: Diagnosis not present

## 2019-12-29 DIAGNOSIS — M25511 Pain in right shoulder: Secondary | ICD-10-CM | POA: Diagnosis not present

## 2019-12-31 DIAGNOSIS — M6281 Muscle weakness (generalized): Secondary | ICD-10-CM | POA: Diagnosis not present

## 2019-12-31 DIAGNOSIS — M25511 Pain in right shoulder: Secondary | ICD-10-CM | POA: Diagnosis not present

## 2019-12-31 DIAGNOSIS — M25611 Stiffness of right shoulder, not elsewhere classified: Secondary | ICD-10-CM | POA: Diagnosis not present

## 2020-01-04 DIAGNOSIS — M25611 Stiffness of right shoulder, not elsewhere classified: Secondary | ICD-10-CM | POA: Diagnosis not present

## 2020-01-04 DIAGNOSIS — M6281 Muscle weakness (generalized): Secondary | ICD-10-CM | POA: Diagnosis not present

## 2020-01-04 DIAGNOSIS — M25511 Pain in right shoulder: Secondary | ICD-10-CM | POA: Diagnosis not present

## 2020-01-06 DIAGNOSIS — M6281 Muscle weakness (generalized): Secondary | ICD-10-CM | POA: Diagnosis not present

## 2020-01-06 DIAGNOSIS — M25611 Stiffness of right shoulder, not elsewhere classified: Secondary | ICD-10-CM | POA: Diagnosis not present

## 2020-01-06 DIAGNOSIS — M25511 Pain in right shoulder: Secondary | ICD-10-CM | POA: Diagnosis not present

## 2020-01-11 DIAGNOSIS — M25511 Pain in right shoulder: Secondary | ICD-10-CM | POA: Diagnosis not present

## 2020-01-11 DIAGNOSIS — M6281 Muscle weakness (generalized): Secondary | ICD-10-CM | POA: Diagnosis not present

## 2020-01-11 DIAGNOSIS — M25611 Stiffness of right shoulder, not elsewhere classified: Secondary | ICD-10-CM | POA: Diagnosis not present

## 2020-01-13 DIAGNOSIS — M25511 Pain in right shoulder: Secondary | ICD-10-CM | POA: Diagnosis not present

## 2020-01-13 DIAGNOSIS — M6281 Muscle weakness (generalized): Secondary | ICD-10-CM | POA: Diagnosis not present

## 2020-01-13 DIAGNOSIS — M25611 Stiffness of right shoulder, not elsewhere classified: Secondary | ICD-10-CM | POA: Diagnosis not present

## 2020-01-14 ENCOUNTER — Other Ambulatory Visit: Payer: Self-pay

## 2020-01-14 MED ORDER — ROPINIROLE HCL 2 MG PO TABS: 2.0000 mg | ORAL_TABLET | Freq: Every evening | ORAL | 6 refills | Status: DC

## 2020-01-18 DIAGNOSIS — M6281 Muscle weakness (generalized): Secondary | ICD-10-CM | POA: Diagnosis not present

## 2020-01-18 DIAGNOSIS — M25611 Stiffness of right shoulder, not elsewhere classified: Secondary | ICD-10-CM | POA: Diagnosis not present

## 2020-01-18 DIAGNOSIS — M25511 Pain in right shoulder: Secondary | ICD-10-CM | POA: Diagnosis not present

## 2020-01-20 DIAGNOSIS — M25611 Stiffness of right shoulder, not elsewhere classified: Secondary | ICD-10-CM | POA: Diagnosis not present

## 2020-01-20 DIAGNOSIS — M6281 Muscle weakness (generalized): Secondary | ICD-10-CM | POA: Diagnosis not present

## 2020-01-20 DIAGNOSIS — M25511 Pain in right shoulder: Secondary | ICD-10-CM | POA: Diagnosis not present

## 2020-01-26 DIAGNOSIS — M6281 Muscle weakness (generalized): Secondary | ICD-10-CM | POA: Diagnosis not present

## 2020-01-26 DIAGNOSIS — M25611 Stiffness of right shoulder, not elsewhere classified: Secondary | ICD-10-CM | POA: Diagnosis not present

## 2020-01-26 DIAGNOSIS — M25511 Pain in right shoulder: Secondary | ICD-10-CM | POA: Diagnosis not present

## 2020-01-28 DIAGNOSIS — M25511 Pain in right shoulder: Secondary | ICD-10-CM | POA: Diagnosis not present

## 2020-01-28 DIAGNOSIS — M6281 Muscle weakness (generalized): Secondary | ICD-10-CM | POA: Diagnosis not present

## 2020-01-28 DIAGNOSIS — M25611 Stiffness of right shoulder, not elsewhere classified: Secondary | ICD-10-CM | POA: Diagnosis not present

## 2020-02-01 DIAGNOSIS — M25611 Stiffness of right shoulder, not elsewhere classified: Secondary | ICD-10-CM | POA: Diagnosis not present

## 2020-02-01 DIAGNOSIS — M25511 Pain in right shoulder: Secondary | ICD-10-CM | POA: Diagnosis not present

## 2020-02-01 DIAGNOSIS — M6281 Muscle weakness (generalized): Secondary | ICD-10-CM | POA: Diagnosis not present

## 2020-02-04 DIAGNOSIS — M25511 Pain in right shoulder: Secondary | ICD-10-CM | POA: Diagnosis not present

## 2020-02-04 DIAGNOSIS — M6281 Muscle weakness (generalized): Secondary | ICD-10-CM | POA: Diagnosis not present

## 2020-02-04 DIAGNOSIS — M25611 Stiffness of right shoulder, not elsewhere classified: Secondary | ICD-10-CM | POA: Diagnosis not present

## 2020-02-09 ENCOUNTER — Encounter: Payer: Self-pay | Admitting: Legal Medicine

## 2020-02-09 ENCOUNTER — Other Ambulatory Visit: Payer: Self-pay

## 2020-02-09 ENCOUNTER — Ambulatory Visit (INDEPENDENT_AMBULATORY_CARE_PROVIDER_SITE_OTHER): Payer: Medicare Other | Admitting: Legal Medicine

## 2020-02-09 VITALS — BP 126/80 | HR 96 | Temp 97.4°F | Resp 18 | Ht 63.0 in | Wt 163.8 lb

## 2020-02-09 DIAGNOSIS — S336XXA Sprain of sacroiliac joint, initial encounter: Secondary | ICD-10-CM | POA: Diagnosis not present

## 2020-02-09 DIAGNOSIS — M545 Low back pain, unspecified: Secondary | ICD-10-CM

## 2020-02-09 DIAGNOSIS — M25511 Pain in right shoulder: Secondary | ICD-10-CM | POA: Diagnosis not present

## 2020-02-09 DIAGNOSIS — M25611 Stiffness of right shoulder, not elsewhere classified: Secondary | ICD-10-CM | POA: Diagnosis not present

## 2020-02-09 DIAGNOSIS — M6281 Muscle weakness (generalized): Secondary | ICD-10-CM | POA: Diagnosis not present

## 2020-02-09 MED ORDER — HYDROCODONE-ACETAMINOPHEN 10-325 MG PO TABS: 1.0000 | ORAL_TABLET | Freq: Three times a day (TID) | ORAL | 0 refills | Status: DC | PRN

## 2020-02-09 NOTE — Progress Notes (Signed)
Subjective:  Patient ID: Alexandra [name], female    DOB: 16-Jul-1940  Age: 79 y.o. MRN: [id number]  Chief Complaint  Patient presents with  . Hip Pain    HPI: right hip locked.  She was in therapy, Her right hip hurts. Can walk but with pain.  Started yesterday. No bladder or bowel problems.  She is walking hunched over.   Current Outpatient Medications on File Prior to Visit  Medication Sig Dispense Refill  . alendronate (FOSAMAX) 70 MG tablet Take 1 tablet (70 mg total) by mouth once a week. Take with a full glass of water on an empty stomach. 12 tablet 3  . amLODipine (NORVASC) 10 MG tablet Take 10 mg by mouth every morning.    Marland Kitchen esomeprazole (NEXIUM) 40 MG capsule Take 40 mg by mouth daily as needed.     . formoterol (PERFOROMIST) 20 MCG/2ML nebulizer solution USE 1 VIAL  IN  NEBULIZER TWICE  DAILY - -Morning and Evening (Patient taking differently: Take 20 mcg by nebulization 2 (two) times daily. USE 1 VIAL  IN  NEBULIZER TWICE  DAILY - -Morning and Evening) 60 mL 11  . revefenacin (YUPELRI) 175 MCG/3ML nebulizer solution Take 175 mcg by nebulization daily.    Marland Kitchen rOPINIRole (REQUIP) 2 MG tablet Take 1 tablet (2 mg total) by mouth at bedtime. 90 tablet 6  . simvastatin (ZOCOR) 40 MG tablet TAKE 1 TABLET BY MOUTH EVERY DAY 90 tablet 2   No current facility-administered medications on file prior to visit.   Past Medical History:  Diagnosis Date  . Arthritis   . Cancer [facility]) 1999   left kidney removed  . GERD (gastroesophageal reflux disease)    hx of none recent  . Hyperparathyroidism ([place])   . Hypertension   . Osteoporosis   . Restless legs syndrome    Past Surgical History:  Procedure Laterality Date  . ABDOMINAL HYSTERECTOMY     complete  . BACK SURGERY  2016   lower L 4 to L 5  . bladder tack and uterus lift  10/10/2015  . IR RADIOLOGIST EVAL & MGMT  12/03/2018  . IR RADIOLOGIST EVAL & MGMT  12/31/2018  . left nephrectomy  1999  . PARATHYROIDECTOMY Left 07/02/2016    Procedure: LEFT INFERIOR PARATHYROIDECTOMY;  Surgeon: Armandina Gemma, MD;  Location: [facility];  Service: General;  Laterality: Left;    Family History  Problem Relation Age of Onset  . Emphysema Mother    Social History   Socioeconomic History  . Marital status: Married    Spouse name: Not on file  . Number of children: Not on file  . Years of education: Not on file  . Highest education level: Not on file  Occupational History  . Not on file  Tobacco Use  . Smoking status: Former Smoker    Packs/day: 1.00    Years: 50.00    Pack years: 50.00    Types: Cigarettes    Quit date: 06/11/2005    Years since quitting: 14.6  . Smokeless tobacco: Never Used  Vaping Use  . Vaping Use: Never used  Substance and Sexual Activity  . Alcohol use: No  . Drug use: No  . Sexual activity: Yes    Partners: Male  Other Topics Concern  . Not on file  Social History Narrative  . Not on file   Social Determinants of Health   Financial Resource Strain:   . Difficulty of Paying Living Expenses: Not on file  Food Insecurity: No Food Insecurity  . Worried About Charity fundraiser in the Last Year: Never true  . Ran Out of Food in the Last Year: Never true  Transportation Needs: No Transportation Needs  . Lack of Transportation (Medical): No  . Lack of Transportation (Non-Medical): No  Physical Activity:   . Days of Exercise per Week: Not on file  . Minutes of Exercise per Session: Not on file  Stress:   . Feeling of Stress : Not on file  Social Connections:   . Frequency of Communication with Friends and Family: Not on file  . Frequency of Social Gatherings with Friends and Family: Not on file  . Attends Religious Services: Not on file  . Active Member of Clubs or Organizations: Not on file  . Attends Archivist Meetings: Not on file  . Marital Status: Not on file    Review of Systems  Constitutional: Negative.   HENT: Negative.   Eyes: Negative.   Respiratory: Negative for  cough, choking, chest tightness and shortness of breath.   Cardiovascular: Negative for chest pain, palpitations and leg swelling.  Endocrine: Negative.   Genitourinary: Negative.   Musculoskeletal: Positive for back pain.  Neurological: Negative.   Psychiatric/Behavioral: Negative.      Objective:  BP 126/80   Pulse 96   Temp (!) 97.4 F (36.3 C)   Resp 18   Ht 5\' 3"  (1.6 m)   Wt 163 lb 12.8 oz (74.3 kg)   SpO2 93%   BMI 29.02 kg/m   BP/Weight 02/09/2020 12/10/2019 11/22/4313  Systolic BP 400 867 619  Diastolic BP 80 72 70  Wt. (Lbs) 163.8 - -  BMI 29.02 - -    Physical Exam Vitals reviewed.  Constitutional:      Appearance: Normal appearance.  HENT:     Head: Normocephalic.     Right Ear: Tympanic membrane, ear canal and external ear normal.     Left Ear: Tympanic membrane, ear canal and external ear normal.  Eyes:     Extraocular Movements: Extraocular movements intact.     Conjunctiva/sclera: Conjunctivae normal.     Pupils: Pupils are equal, round, and reactive to light.  Cardiovascular:     Rate and Rhythm: Normal rate and regular rhythm.     Pulses: Normal pulses.     Heart sounds: Normal heart sounds.  Pulmonary:     Effort: Pulmonary effort is normal.     Breath sounds: Normal breath sounds.  Abdominal:     General: Abdomen is flat. Bowel sounds are normal.  Musculoskeletal:     Cervical back: Normal range of motion and neck supple.     Lumbar back: Spasms and tenderness present. Negative right straight leg raise test and negative left straight leg raise test.       Back:     Comments: Pain right sciatic notch.  Radiates down right leg  Skin:    General: Skin is warm and dry.     Capillary Refill: Capillary refill takes less than 2 seconds.  Neurological:     General: No focal deficit present.     Mental Status: She is alert and oriented to person, place, and time.       Lab Results  Component Value Date   WBC 11.6 (H) 11/24/2019   HGB 12.0  11/24/2019   HCT 36.6 11/24/2019   PLT 419 11/24/2019   GLUCOSE 102 (H) 11/24/2019   CHOL 158 11/24/2019   TRIG  130 11/24/2019   HDL 49 11/24/2019   LDLCALC 86 11/24/2019   ALT 12 11/24/2019   AST 16 11/24/2019   NA 137 11/24/2019   K 4.7 11/24/2019   CL 103 11/24/2019   CREATININE 1.68 (H) 11/24/2019   BUN 27 11/24/2019   CO2 19 (L) 11/24/2019      Assessment & Plan:   1. Acute right-sided low back pain without sciatica - HYDROcodone-acetaminophen (NORCO) 10-325 MG tablet; Take 1 tablet by mouth every 8 (eight) hours as needed.  Dispense: 30 tablet; Refill: 0 Severe pain right SI joint. Sudden onset. With severe pain.  2. Sacroiliac (ligament) sprain, initial encounter The right SI joint was injected.  After consent was obtained, using sterile technique the right SI joint was prepped and Ethyl Chloride  was used as local anesthetic. The joint was entered and 0 ml's  fluid was withdrawn and.  Steroid 80 mg and 3 ml plain Lidocaine was then injected and the needle withdrawn.  The procedure was well tolerated.  The patient is asked to continue to rest the joint for a few more days before resuming regular activities.  It may be more painful for the first 1-2 days.  Watch for fever, or increased swelling or persistent pain in the joint. Call or return to clinic prn if such symptoms occur or there is failure to improve as anticipated.     Meds ordered this encounter  Medications  . HYDROcodone-acetaminophen (NORCO) 10-325 MG tablet    Sig: Take 1 tablet by mouth every 8 (eight) hours as needed.    Dispense:  30 tablet    Refill:  0    No orders of the defined types were placed in this encounter.    Follow-up: Return in about 1 week (around 02/16/2020) for back.  An After Visit Summary was printed and given to the patient.  [place] [phone]

## 2020-02-09 NOTE — Patient Instructions (Signed)

## 2020-02-11 DIAGNOSIS — M25511 Pain in right shoulder: Secondary | ICD-10-CM | POA: Diagnosis not present

## 2020-02-11 DIAGNOSIS — M25611 Stiffness of right shoulder, not elsewhere classified: Secondary | ICD-10-CM | POA: Diagnosis not present

## 2020-02-11 DIAGNOSIS — M6281 Muscle weakness (generalized): Secondary | ICD-10-CM | POA: Diagnosis not present

## 2020-02-16 DIAGNOSIS — M25611 Stiffness of right shoulder, not elsewhere classified: Secondary | ICD-10-CM | POA: Diagnosis not present

## 2020-02-16 DIAGNOSIS — M6281 Muscle weakness (generalized): Secondary | ICD-10-CM | POA: Diagnosis not present

## 2020-02-16 DIAGNOSIS — M25511 Pain in right shoulder: Secondary | ICD-10-CM | POA: Diagnosis not present

## 2020-02-17 ENCOUNTER — Other Ambulatory Visit: Payer: Self-pay

## 2020-02-17 ENCOUNTER — Encounter: Payer: Self-pay | Admitting: Legal Medicine

## 2020-02-17 ENCOUNTER — Ambulatory Visit (INDEPENDENT_AMBULATORY_CARE_PROVIDER_SITE_OTHER): Payer: Medicare Other | Admitting: Legal Medicine

## 2020-02-17 VITALS — BP 150/80 | HR 75 | Temp 97.4°F | Resp 16 | Ht 63.0 in | Wt 165.0 lb

## 2020-02-17 DIAGNOSIS — M545 Low back pain, unspecified: Secondary | ICD-10-CM

## 2020-02-17 DIAGNOSIS — S336XXA Sprain of sacroiliac joint, initial encounter: Secondary | ICD-10-CM | POA: Diagnosis not present

## 2020-02-17 DIAGNOSIS — Z23 Encounter for immunization: Secondary | ICD-10-CM | POA: Diagnosis not present

## 2020-02-17 MED ORDER — CYCLOBENZAPRINE HCL 10 MG PO TABS: 10.0000 mg | ORAL_TABLET | Freq: Three times a day (TID) | ORAL | 0 refills | Status: DC | PRN

## 2020-02-17 NOTE — Progress Notes (Signed)
Acute Office Visit  Subjective:    Patient ID: Alexandra [name], female    DOB: 11-Jul-1940, 79 y.o.   MRN: [id number]  Chief Complaint  Patient presents with  . Back Pain    Right side  . Hip Pain    Right side    HPI Patient is in today for follow up right hip pain.  She can walk but she says it " locks" at times.  The pain is over right SI joint and lateral gluteus.  Negative SLR. She was injected that helped 3 days only.  Past Medical History:  Diagnosis Date  . Arthritis   . 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 GERD (gastroesophageal reflux disease)    hx of none recent  . Hyperparathyroidism ([place])   . Hypertension   . Osteoporosis   . Restless legs syndrome     Past Surgical History:  Procedure Laterality Date  . ABDOMINAL HYSTERECTOMY     complete  . BACK SURGERY  2016   lower L 4 to L 5  . bladder tack and uterus lift  10/10/2015  . IR RADIOLOGIST EVAL & MGMT  12/03/2018  . IR RADIOLOGIST EVAL & MGMT  12/31/2018  . left nephrectomy  1999  . PARATHYROIDECTOMY Left 07/02/2016   Procedure: LEFT INFERIOR PARATHYROIDECTOMY;  Surgeon: Armandina Gemma, MD;  Location: [facility];  Service: General;  Laterality: Left;    Family History  Problem Relation Age of Onset  . Emphysema Mother     Social History   Socioeconomic History  . Marital status: Married    Spouse name: Not on file  . Number of children: Not on file  . Years of education: Not on file  . Highest education level: Not on file  Occupational History  . Not on file  Tobacco Use  . Smoking status: Former Smoker    Packs/day: 1.00    Years: 50.00    Pack years: 50.00    Types: Cigarettes    Quit date: 06/11/2005    Years since quitting: 14.6  . Smokeless tobacco: Never Used  Vaping Use  . Vaping Use: Never used  Substance and Sexual Activity  . Alcohol use: No  . Drug use: No  . Sexual activity: Yes    Partners: Male  Other Topics Concern  . Not on file  Social History Narrative  . Not on  file   Social Determinants of Health   Financial Resource Strain:   . Difficulty of Paying Living Expenses: Not on file  Food Insecurity: No Food Insecurity  . Worried About Charity fundraiser in the Last Year: Never true  . Ran Out of Food in the Last Year: Never true  Transportation Needs: No Transportation Needs  . Lack of Transportation (Medical): No  . Lack of Transportation (Non-Medical): No  Physical Activity:   . Days of Exercise per Week: Not on file  . Minutes of Exercise per Session: Not on file  Stress:   . Feeling of Stress : Not on file  Social Connections:   . Frequency of Communication with Friends and Family: Not on file  . Frequency of Social Gatherings with Friends and Family: Not on file  . Attends Religious Services: Not on file  . Active Member of Clubs or Organizations: Not on file  . Attends Archivist Meetings: Not on file  . Marital Status: Not on file  Intimate Partner Violence:   . Fear of  Current or Ex-Partner: Not on file  . Emotionally Abused: Not on file  . Physically Abused: Not on file  . Sexually Abused: Not on file    Outpatient Medications Prior to Visit  Medication Sig Dispense Refill  . alendronate (FOSAMAX) 70 MG tablet Take 1 tablet (70 mg total) by mouth once a week. Take with a full glass of water on an empty stomach. 12 tablet 3  . amLODipine (NORVASC) 10 MG tablet Take 10 mg by mouth every morning.    Marland Kitchen esomeprazole (NEXIUM) 40 MG capsule Take 40 mg by mouth daily as needed.     . formoterol (PERFOROMIST) 20 MCG/2ML nebulizer solution USE 1 VIAL  IN  NEBULIZER TWICE  DAILY - -Morning and Evening (Patient taking differently: Take 20 mcg by nebulization 2 (two) times daily. USE 1 VIAL  IN  NEBULIZER TWICE  DAILY - -Morning and Evening) 60 mL 11  . HYDROcodone-acetaminophen (NORCO) 10-325 MG tablet Take 1 tablet by mouth every 8 (eight) hours as needed. 30 tablet 0  . revefenacin (YUPELRI) 175 MCG/3ML nebulizer solution Take  175 mcg by nebulization daily.    Marland Kitchen rOPINIRole (REQUIP) 2 MG tablet Take 1 tablet (2 mg total) by mouth at bedtime. 90 tablet 6  . simvastatin (ZOCOR) 40 MG tablet TAKE 1 TABLET BY MOUTH EVERY DAY 90 tablet 2   No facility-administered medications prior to visit.    Allergies  Allergen Reactions  . Contrast Media [Iodinated Diagnostic Agents] Hives, Itching and Rash  . Shellfish Allergy Nausea And Vomiting  . Shellfish-Derived Products Nausea And Vomiting    Review of Systems  Constitutional: Negative.   HENT: Negative.   Respiratory: Negative.   Cardiovascular: Negative.   Gastrointestinal: Negative.   Genitourinary: Negative.   Musculoskeletal: Positive for back pain.  Neurological: Negative.   Hematological: Negative.   Psychiatric/Behavioral: Negative.        Objective:    Physical Exam Vitals reviewed.  Constitutional:      Appearance: Normal appearance.  HENT:     Right Ear: Tympanic membrane normal.     Left Ear: Tympanic membrane normal.  Cardiovascular:     Rate and Rhythm: Normal rate and regular rhythm.     Pulses: Normal pulses.     Heart sounds: Normal heart sounds.  Pulmonary:     Effort: Pulmonary effort is normal.     Breath sounds: Normal breath sounds.  Musculoskeletal:        General: Normal range of motion.  Skin:    General: Skin is warm and dry.     Capillary Refill: Capillary refill takes less than 2 seconds.  Neurological:     General: No focal deficit present.     Mental Status: She is alert and oriented to person, place, and time.     BP (!) 150/80   Pulse 75   Temp (!) 97.4 F (36.3 C)   Resp 16   Ht 5\' 3"  (1.6 m)   Wt 165 lb (74.8 kg)   SpO2 98%   BMI 29.23 kg/m  Wt Readings from Last 3 Encounters:  02/17/20 165 lb (74.8 kg)  02/09/20 163 lb 12.8 oz (74.3 kg)  11/24/19 163 lb 3.2 oz (74 kg)    Health Maintenance Due  Topic Date Due  . Hepatitis C Screening  Never done  . COVID-19 Vaccine (1) Never done  .  TETANUS/TDAP  Never done  . DEXA SCAN  Never done  . PNA vac Low Risk Adult (  2 of 2 - PPSV23) 03/27/2018    There are no preventive care reminders to display for this patient.   No results found for: TSH Lab Results  Component Value Date   WBC 11.6 (H) 11/24/2019   HGB 12.0 11/24/2019   HCT 36.6 11/24/2019   MCV 93 11/24/2019   PLT 419 11/24/2019   Lab Results  Component Value Date   NA 137 11/24/2019   K 4.7 11/24/2019   CO2 19 (L) 11/24/2019   GLUCOSE 102 (H) 11/24/2019   BUN 27 11/24/2019   CREATININE 1.68 (H) 11/24/2019   BILITOT 0.4 11/24/2019   ALKPHOS 101 11/24/2019   AST 16 11/24/2019   ALT 12 11/24/2019   PROT 6.5 11/24/2019   ALBUMIN 4.0 11/24/2019   CALCIUM 9.2 11/24/2019   ANIONGAP 5 06/26/2016   Lab Results  Component Value Date   CHOL 158 11/24/2019   Lab Results  Component Value Date   HDL 49 11/24/2019   Lab Results  Component Value Date   LDLCALC 86 11/24/2019   Lab Results  Component Value Date   TRIG 130 11/24/2019   Lab Results  Component Value Date   CHOLHDL 3.2 11/24/2019   No results found for: HGBA1C     Assessment & Plan:  1. Acute right-sided low back pain without sciatica Patient is having continued right sided hip pain that is severe, I recommended referral to Dr. Suszanne Finch and I gave flexeril.  Diagnoses and all orders for this visit: Acute right-sided low back pain without sciatica -     AMB referral to orthopedics Sacroiliac (ligament) sprain, initial encounter -     cyclobenzaprine (FLEXERIL) 10 MG tablet; Take 1 tablet (10 mg total) by mouth 3 (three) times daily as needed for muscle spasms. Need for immunization against influenza -     Flu Vaccine QUAD High Dose(Fluad) RX for shingrix and adult Dtap at pharmacy.      Follow-up: Return if symptoms worsen or fail to improve.  An After Visit Summary was printed and given to the patient.  [place] [phone]

## 2020-02-22 DIAGNOSIS — M25511 Pain in right shoulder: Secondary | ICD-10-CM | POA: Diagnosis not present

## 2020-02-22 DIAGNOSIS — M25611 Stiffness of right shoulder, not elsewhere classified: Secondary | ICD-10-CM | POA: Diagnosis not present

## 2020-02-22 DIAGNOSIS — M6281 Muscle weakness (generalized): Secondary | ICD-10-CM | POA: Diagnosis not present

## 2020-02-24 DIAGNOSIS — C672 Malignant neoplasm of lateral wall of bladder: Secondary | ICD-10-CM | POA: Diagnosis not present

## 2020-02-24 DIAGNOSIS — N2889 Other specified disorders of kidney and ureter: Secondary | ICD-10-CM | POA: Diagnosis not present

## 2020-03-01 DIAGNOSIS — E278 Other specified disorders of adrenal gland: Secondary | ICD-10-CM | POA: Diagnosis not present

## 2020-03-01 DIAGNOSIS — I700 Atherosclerosis of aorta: Secondary | ICD-10-CM | POA: Diagnosis not present

## 2020-03-01 DIAGNOSIS — N2889 Other specified disorders of kidney and ureter: Secondary | ICD-10-CM | POA: Diagnosis not present

## 2020-03-01 DIAGNOSIS — Z905 Acquired absence of kidney: Secondary | ICD-10-CM | POA: Diagnosis not present

## 2020-03-02 ENCOUNTER — Other Ambulatory Visit: Payer: Self-pay

## 2020-03-02 ENCOUNTER — Encounter: Payer: Self-pay | Admitting: Legal Medicine

## 2020-03-02 ENCOUNTER — Ambulatory Visit (INDEPENDENT_AMBULATORY_CARE_PROVIDER_SITE_OTHER): Payer: Medicare Other | Admitting: Legal Medicine

## 2020-03-02 DIAGNOSIS — R0789 Other chest pain: Secondary | ICD-10-CM | POA: Diagnosis not present

## 2020-03-02 DIAGNOSIS — S058X2A Other injuries of left eye and orbit, initial encounter: Secondary | ICD-10-CM

## 2020-03-02 DIAGNOSIS — S058X1A Other injuries of right eye and orbit, initial encounter: Secondary | ICD-10-CM | POA: Insufficient documentation

## 2020-03-02 DIAGNOSIS — S0033XA Contusion of nose, initial encounter: Secondary | ICD-10-CM | POA: Insufficient documentation

## 2020-03-02 NOTE — Progress Notes (Signed)
Subjective:  Patient ID: Alexandra [name], female    DOB: 17-Oct-1940  Age: 79 y.o. MRN: [id number]  Chief Complaint  Patient presents with  . Fall    Patient fell at home 4 days ago.    HPI : patient fell over vent. Over weekend 02/27/2020.  No LOC but had epistaxis. She has bruises on face.  No neck pain.  She has pain in right lowe ribs but can take a deep breath. She is feeling better.   Current Outpatient Medications on File Prior to Visit  Medication Sig Dispense Refill  . alendronate (FOSAMAX) 70 MG tablet Take 1 tablet (70 mg total) by mouth once a week. Take with a full glass of water on an empty stomach. 12 tablet 3  . amLODipine (NORVASC) 10 MG tablet Take 10 mg by mouth every morning.    . cyclobenzaprine (FLEXERIL) 10 MG tablet Take 1 tablet (10 mg total) by mouth 3 (three) times daily as needed for muscle spasms. 30 tablet 0  . esomeprazole (NEXIUM) 40 MG capsule Take 40 mg by mouth daily as needed.     . formoterol (PERFOROMIST) 20 MCG/2ML nebulizer solution USE 1 VIAL  IN  NEBULIZER TWICE  DAILY - -Morning and Evening (Patient taking differently: Take 20 mcg by nebulization 2 (two) times daily. USE 1 VIAL  IN  NEBULIZER TWICE  DAILY - -Morning and Evening) 60 mL 11  . revefenacin (YUPELRI) 175 MCG/3ML nebulizer solution Take 175 mcg by nebulization daily.    Marland Kitchen rOPINIRole (REQUIP) 2 MG tablet Take 1 tablet (2 mg total) by mouth at bedtime. 90 tablet 6  . simvastatin (ZOCOR) 40 MG tablet TAKE 1 TABLET BY MOUTH EVERY DAY 90 tablet 2   No current facility-administered medications on file prior to visit.   Past Medical History:  Diagnosis Date  . Arthritis   . 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 Osteoporosis   . Restless legs syndrome    Past Surgical History:  Procedure Laterality Date  . ABDOMINAL HYSTERECTOMY     complete  . BACK SURGERY  2016   lower L 4 to L 5  . bladder tack and uterus lift  10/10/2015  . IR RADIOLOGIST EVAL & MGMT  12/03/2018  . IR  RADIOLOGIST EVAL & MGMT  12/31/2018  . left nephrectomy  1999  . PARATHYROIDECTOMY Left 07/02/2016   Procedure: LEFT INFERIOR PARATHYROIDECTOMY;  Surgeon: Armandina Gemma, MD;  Location: [facility];  Service: General;  Laterality: Left;  . SHOULDER OPEN ROTATOR CUFF REPAIR  12/2019    Family History  Problem Relation Age of Onset  . Emphysema Mother    Social History   Socioeconomic History  . Marital status: Married    Spouse name: Not on file  . Number of children: Not on file  . Years of education: Not on file  . Highest education level: Not on file  Occupational History  . Not on file  Tobacco Use  . Smoking status: Former Smoker    Packs/day: 1.00    Years: 50.00    Pack years: 50.00    Types: Cigarettes    Quit date: 06/11/2005    Years since quitting: 14.7  . Smokeless tobacco: Never Used  Vaping Use  . Vaping Use: Never used  Substance and Sexual Activity  . Alcohol use: No  . Drug use: No  . Sexual activity: Yes    Partners: Male  Other Topics Concern  . Not  on file  Social History Narrative  . Not on file   Social Determinants of Health   Financial Resource Strain:   . Difficulty of Paying Living Expenses: Not on file  Food Insecurity: No Food Insecurity  . Worried About Charity fundraiser in the Last Year: Never true  . Ran Out of Food in the Last Year: Never true  Transportation Needs: No Transportation Needs  . Lack of Transportation (Medical): No  . Lack of Transportation (Non-Medical): No  Physical Activity:   . Days of Exercise per Week: Not on file  . Minutes of Exercise per Session: Not on file  Stress:   . Feeling of Stress : Not on file  Social Connections:   . Frequency of Communication with Friends and Family: Not on file  . Frequency of Social Gatherings with Friends and Family: Not on file  . Attends Religious Services: Not on file  . Active Member of Clubs or Organizations: Not on file  . Attends Archivist Meetings: Not on file  .  Marital Status: Not on file    Review of Systems  Constitutional: Negative.   HENT: Positive for nosebleeds.   Eyes: Negative.   Respiratory:       Right chest wall pain  Cardiovascular: Negative.   Gastrointestinal: Negative.   Endocrine: Negative.   Genitourinary: Negative.   Neurological: Negative.      Objective:  BP (!) 150/80   Temp (!) 97.1 F (36.2 C) (Temporal)   Resp 17   Ht 5\' 3"  (1.6 m)   Wt 159 lb (72.1 kg)   BMI 28.17 kg/m   BP/Weight 03/02/2020 02/17/2020 0/27/2536  Systolic BP 644 034 742  Diastolic BP 80 80 80  Wt. (Lbs) 159 165 163.8  BMI 28.17 29.23 29.02    Physical Exam Constitutional:      Appearance: Normal appearance.  HENT:     Head: Raccoon eyes and contusion present. No Battle's sign or laceration.      Comments: Bruising on face    Right Ear: Tympanic membrane, ear canal and external ear normal.     Left Ear: Tympanic membrane, ear canal and external ear normal.  Eyes:     Extraocular Movements: Extraocular movements intact.     Pupils: Pupils are equal, round, and reactive to light.  Cardiovascular:     Rate and Rhythm: Normal rate and regular rhythm.  Chest:     Chest wall: Tenderness present.     Breasts:        Right: Normal.        Left: Normal.       Comments: Pain right lower ribs. Abdominal:     General: Abdomen is flat. Bowel sounds are normal.     Palpations: Abdomen is soft.  Musculoskeletal:     Cervical back: Normal range of motion and neck supple.  Skin:    General: Skin is warm and dry.  Neurological:     General: No focal deficit present.     Mental Status: She is alert and oriented to person, place, and time.      Lab Results  Component Value Date   WBC 11.6 (H) 11/24/2019   HGB 12.0 11/24/2019   HCT 36.6 11/24/2019   PLT 419 11/24/2019   GLUCOSE 102 (H) 11/24/2019   CHOL 158 11/24/2019   TRIG 130 11/24/2019   HDL 49 11/24/2019   LDLCALC 86 11/24/2019   ALT 12 11/24/2019   AST 16 11/24/2019  NA 137 11/24/2019   K 4.7 11/24/2019   CL 103 11/24/2019   CREATININE 1.68 (H) 11/24/2019   BUN 27 11/24/2019   CO2 19 (L) 11/24/2019      Assessment & Plan:   1. Contusion of ala nasi, initial encounter Patient has bruising around right eye and chin on right.  Full ROM eyes but facial bones tender.  2. Contusion of both eyes, initial encounter Patient has contusion with bruising on right side of face Getting facial bone x-rays  3. Chest wall pain Pain on right chest wall.   Worse when she bends over. I am getting right rib films.  Can use her hydrocodone at home.      Follow-up: Return if symptoms worsen or fail to improve.  An After Visit Summary was printed and given to the patient.  [place] [phone]

## 2020-03-03 DIAGNOSIS — S0033XA Contusion of nose, initial encounter: Secondary | ICD-10-CM | POA: Diagnosis not present

## 2020-03-03 DIAGNOSIS — S058X2A Other injuries of left eye and orbit, initial encounter: Secondary | ICD-10-CM | POA: Diagnosis not present

## 2020-03-03 DIAGNOSIS — R0789 Other chest pain: Secondary | ICD-10-CM | POA: Diagnosis not present

## 2020-03-03 DIAGNOSIS — S058X1A Other injuries of right eye and orbit, initial encounter: Secondary | ICD-10-CM | POA: Diagnosis not present

## 2020-03-03 DIAGNOSIS — R519 Headache, unspecified: Secondary | ICD-10-CM | POA: Diagnosis not present

## 2020-03-03 DIAGNOSIS — R079 Chest pain, unspecified: Secondary | ICD-10-CM | POA: Diagnosis not present

## 2020-03-03 DIAGNOSIS — I700 Atherosclerosis of aorta: Secondary | ICD-10-CM | POA: Diagnosis not present

## 2020-03-07 NOTE — Chronic Care Management (AMB) (Deleted)
Chronic Care Management Pharmacy  Name: Alexandra [name]  MRN: [id number] DOB: [birth year]  Chief Complaint/ HPI  Alexandra [name],  79 y.o. , female presents for their Initial CCM visit with the clinical pharmacist via telephone due to COVID-19 Pandemic.  PCP : Alexandra Anes, MD  Their chronic conditions include: COPD, HTN, Reflux esophagitis, Osteoporosis, HLD.   Office Visits: 03/02/2020 - contusion after fall. Facial bones tender and bruising on face. Fell at home. Ordered rib films.  02/15/2020 - Flexeril ordered for righ-sided hip pain. Referred to Dr. Jacki [name].  02/09/2020 - PCP for Sacroiliac ligment sprain - hydrocodone prescribed and kenalog shot administered.  11/24/2019 - PCP - WBC elevated - chronic, no anemia, glucose 102. Kidney tests stable consider nephrology consult, liver tests ok, Cholesterol normal- recheck BP at home or in 2 weeks 08/04/2019 - refilled fosamax. Routine visit.  04/03/2019 - Referral to ortho for hip pain. No anemia, kidney tests table stage 4, liver test normal.   Consult Visit: 02/24/2020 - Urology - hematuria. Ordered MRI of kidneys.  12/08/2019 - Ortho visit - postop visit for shoulder impingement. Continue home PT.  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  11/18/2019 - Renal mass scheduled MRI on next visit.  09/2019 - Fall at home. Assessed at ED. Cut to cheek from glasses but no breaks. 08/11/2019 - Urology visit showed irritable bladder. Did procedure and gave levaquin x 1 day.  06/25/2019 - ortho visit for back pain. MRI ordered. Steroid injection.  06/19/2019 - ortho back x-ray. 05/25/2019 - Pulmonology visit.  05/21/2019 - No med changes. Recommended exercise and weight loss.  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 Medications: Outpatient Encounter Medications as of 03/09/2020  Medication Sig  . alendronate (FOSAMAX) 70 MG tablet Take 1  tablet (70 mg total) by mouth once a week. Take with a full glass of water on an empty stomach.  Marland Kitchen amLODipine (NORVASC) 10 MG tablet Take 10 mg by mouth every morning.  . cyclobenzaprine (FLEXERIL) 10 MG tablet Take 1 tablet (10 mg total) by mouth 3 (three) times daily as needed for muscle spasms.  Marland Kitchen esomeprazole (NEXIUM) 40 MG capsule Take 40 mg by mouth daily as needed.   . formoterol (PERFOROMIST) 20 MCG/2ML nebulizer solution USE 1 VIAL  IN  NEBULIZER TWICE  DAILY - -Morning and Evening (Patient taking differently: Take 20 mcg by nebulization 2 (two) times daily. USE 1 VIAL  IN  NEBULIZER TWICE  DAILY - -Morning and Evening)  . revefenacin (YUPELRI) 175 MCG/3ML nebulizer solution Take 175 mcg by nebulization daily.  Marland Kitchen rOPINIRole (REQUIP) 2 MG tablet Take 1 tablet (2 mg total) by mouth at bedtime.  . simvastatin (ZOCOR) 40 MG tablet TAKE 1 TABLET BY MOUTH EVERY DAY   No facility-administered encounter medications on file as of 03/09/2020.   Allergies  Allergen Reactions  . Contrast Media [Iodinated Diagnostic Agents] Hives, Itching and Rash  . Shellfish Allergy Nausea And Vomiting  . Shellfish-Derived Products Nausea And Vomiting   SDOH Screenings   Alcohol Screen:   . Last Alcohol Screening Score (AUDIT): Not on file  Depression (PHQ2-9): Low Risk   . PHQ-2 Score: 0  Financial Resource Strain:   . Difficulty of Paying Living Expenses: Not on file  Food Insecurity: No Food Insecurity  . Worried About Charity fundraiser in the Last Year: Never true  . Ran Out of Food in the Last Year: Never  true  Housing:   . Last Housing Risk Score: Not on file  Physical Activity:   . Days of Exercise per Week: Not on file  . Minutes of Exercise per Session: Not on file  Social Connections:   . Frequency of Communication with Friends and Family: Not on file  . Frequency of Social Gatherings with Friends and Family: Not on file  . Attends Religious Services: Not on file  . Active Member of  Clubs or Organizations: Not on file  . Attends Archivist Meetings: Not on file  . Marital Status: Not on file  Stress:   . Feeling of Stress : Not on file  Tobacco Use: Medium Risk  . Smoking Tobacco Use: Former Smoker  . Smokeless Tobacco Use: Never Used  Transportation Needs: No Transportation Needs  . Lack of Transportation (Medical): No  . Lack of Transportation (Non-Medical): No    Current Diagnosis/Assessment:  Goals Addressed   None     COPD     Eosinophil count:  No results found for: EOSPCT%                               Eos (Absolute):  Lab Results  Component Value Date/Time   EOSABS 0.2 11/24/2019 12:00 AM    Tobacco Status:  Social History   Tobacco Use  Smoking Status Former Smoker  . Packs/day: 1.00  . Years: 50.00  . Pack years: 50.00  . Types: Cigarettes  . Quit date: 06/11/2005  . Years since quitting: 14.7  Smokeless Tobacco Never Used    Patient has failed these meds in past: albuterol hfa inhaler, anoro ellipta Patient is currently controlled on the following medications: perforomist 20 mcg/56ml bid, yupleri Using maintenance inhaler regularly? No  We discussed:  Had pneumonia 10 years ago with oxygen sats in the 50%. She feels like the two breathing treatments she is on now is managing her symptoms well. She is pleased with her results.    Plan  Continue current medications  and  Hypertension   BP today is:  <140/90  Office blood pressures are  BP Readings from Last 3 Encounters:  03/02/20 (!) 150/80  02/17/20 (!) 150/80  02/09/20 126/80    Patient has failed these meds in the past: n/a Patient is currently controlled on the following medications: amlodipine 10 mg daily Patient checks BP at home weekly  Patient home BP readings are ranging: 120/80  We discussed diet and exercise extensively. Patient and her husband are working on much healthier eating habits. She is enjoying more fruits and vegetables. She is excited  for fresher options this summer. .   Plan  Continue current medications   Hyperlipidemia   Lipid Panel     Component Value Date/Time   CHOL 158 11/24/2019 0000   TRIG 130 11/24/2019 0000   HDL 49 11/24/2019 0000   CHOLHDL 3.2 11/24/2019 0000   LDLCALC 86 11/24/2019 0000   LABVLDL 23 11/24/2019 0000     The 10-year ASCVD risk score Mikey Bussing DC Jr., et al., 2013) is: 33.1%   Values used to calculate the score:     Age: 45 years     Sex: Female     Is Non-Hispanic African American: No     Diabetic: No     Tobacco smoker: No     Systolic Blood Pressure: 789 mmHg     Is BP treated: Yes  HDL Cholesterol: 49 mg/dL     Total Cholesterol: 158 mg/dL   Patient has failed these meds in past: n/a Patient is currently controlled on the following medications: simvastatin 40 mg daily  We discussed:  diet and exercise extensively. Patient understands healthier eating does a great job preventing risks.   Plan  Continue current medications  Osteoporosis   Patient has failed these meds in past: n/a Patient is currently controlled on the following medications: alendronate 70 mg weekly  We discussed:  Patient had a fall this week and has a fracture. She attributes her minor fracture to the fact that her Fosamax is working. She is sore and recovering at home.   Plan  Continue current medications  RLS   Patient has failed these meds in past: n/a Patient is currently controlled on the following medications: ropinirole 2 mg hs  We discussed:  Patient says the medication does a great job keeping her legs still at night. She needs a refill today. Pharmacist coordinated in office.   Plan  Continue current medications   GERD   Patient has failed these meds in past: none noted Patient is currently controlled on the following medications: esomeprazole 40 mg daily prn  We discussed:  diet and exercise extensively. Patient says spicy foods and bananas trigger her acid reflux to the  point of vomiting. She avoids those.   Plan  Continue current medications   Vaccines   Reviewed and discussed patient's vaccination history. Has had both COVID vaccines.   Immunization History  Administered Date(s) Administered  . Fluad Quad(high Dose 65+) 03/25/2019, 02/17/2020  . Influenza, High Dose Seasonal PF 03/27/2017, 04/01/2018  . Pneumococcal Conjugate-13 10/15/2013, 03/27/2017  . Pneumococcal-Unspecified 10/15/2013  . Zoster 11/23/2010    Plan  Recommended patient receive annual flu vaccine in office. Patient will discuss with Dr. Henrene Pastor about Shingrix.  Medication Management   Pt uses [facility] for all medications Weekly pill box Pt endorses 100% compliance  We discussed: Keeping medications organized. Patient is out of refills on alendronate and ropinirole. Pharmacist coordinated in office.   Plan  Recommend continuing to use pill box for good adherence.      Follow up: 6 months with Husband's visit.

## 2020-03-09 ENCOUNTER — Telehealth: Payer: Medicare Other

## 2020-03-10 ENCOUNTER — Telehealth: Payer: Medicare Other

## 2020-03-11 DIAGNOSIS — M6281 Muscle weakness (generalized): Secondary | ICD-10-CM | POA: Diagnosis not present

## 2020-03-11 DIAGNOSIS — M25611 Stiffness of right shoulder, not elsewhere classified: Secondary | ICD-10-CM | POA: Diagnosis not present

## 2020-03-11 DIAGNOSIS — M25511 Pain in right shoulder: Secondary | ICD-10-CM | POA: Diagnosis not present

## 2020-03-16 ENCOUNTER — Other Ambulatory Visit: Payer: Self-pay | Admitting: Legal Medicine

## 2020-03-16 DIAGNOSIS — M6281 Muscle weakness (generalized): Secondary | ICD-10-CM | POA: Diagnosis not present

## 2020-03-16 DIAGNOSIS — M25511 Pain in right shoulder: Secondary | ICD-10-CM | POA: Diagnosis not present

## 2020-03-16 DIAGNOSIS — G2581 Restless legs syndrome: Secondary | ICD-10-CM

## 2020-03-16 DIAGNOSIS — M25611 Stiffness of right shoulder, not elsewhere classified: Secondary | ICD-10-CM | POA: Diagnosis not present

## 2020-03-16 MED ORDER — ROPINIROLE HCL 2 MG PO TABS: 2.0000 mg | ORAL_TABLET | Freq: Two times a day (BID) | ORAL | 6 refills | Status: DC

## 2020-03-22 DIAGNOSIS — M79604 Pain in right leg: Secondary | ICD-10-CM | POA: Diagnosis not present

## 2020-03-22 DIAGNOSIS — M7540 Impingement syndrome of unspecified shoulder: Secondary | ICD-10-CM | POA: Diagnosis not present

## 2020-03-22 DIAGNOSIS — M12811 Other specific arthropathies, not elsewhere classified, right shoulder: Secondary | ICD-10-CM | POA: Diagnosis not present

## 2020-03-24 DIAGNOSIS — M47816 Spondylosis without myelopathy or radiculopathy, lumbar region: Secondary | ICD-10-CM | POA: Diagnosis not present

## 2020-03-24 DIAGNOSIS — Z981 Arthrodesis status: Secondary | ICD-10-CM | POA: Diagnosis not present

## 2020-03-25 ENCOUNTER — Other Ambulatory Visit: Payer: Self-pay | Admitting: Pulmonary Disease

## 2020-03-25 MED ORDER — FORMOTEROL FUMARATE 20 MCG/2ML IN NEBU: INHALATION_SOLUTION | RESPIRATORY_TRACT | 1 refills | Status: DC

## 2020-03-25 MED ORDER — YUPELRI 175 MCG/3ML IN SOLN: RESPIRATORY_TRACT | 1 refills | Status: DC

## 2020-03-25 NOTE — Addendum Note (Signed)
Addended by: Claudette Head A on: 03/25/2020 05:04 PM   Modules accepted: Orders

## 2020-03-27 ENCOUNTER — Other Ambulatory Visit: Payer: Self-pay | Admitting: Pulmonary Disease

## 2020-03-28 ENCOUNTER — Ambulatory Visit (INDEPENDENT_AMBULATORY_CARE_PROVIDER_SITE_OTHER): Payer: Medicare Other | Admitting: Legal Medicine

## 2020-03-28 ENCOUNTER — Encounter: Payer: Self-pay | Admitting: Legal Medicine

## 2020-03-28 ENCOUNTER — Other Ambulatory Visit: Payer: Self-pay

## 2020-03-28 ENCOUNTER — Telehealth: Payer: Self-pay

## 2020-03-28 VITALS — BP 128/68 | HR 82 | Temp 97.7°F | Resp 16 | Ht 63.0 in | Wt 167.4 lb

## 2020-03-28 DIAGNOSIS — H906 Mixed conductive and sensorineural hearing loss, bilateral: Secondary | ICD-10-CM

## 2020-03-28 DIAGNOSIS — I10 Essential (primary) hypertension: Secondary | ICD-10-CM | POA: Diagnosis not present

## 2020-03-28 DIAGNOSIS — K2100 Gastro-esophageal reflux disease with esophagitis, without bleeding: Secondary | ICD-10-CM | POA: Diagnosis not present

## 2020-03-28 DIAGNOSIS — J449 Chronic obstructive pulmonary disease, unspecified: Secondary | ICD-10-CM

## 2020-03-28 DIAGNOSIS — C679 Malignant neoplasm of bladder, unspecified: Secondary | ICD-10-CM

## 2020-03-28 DIAGNOSIS — E210 Primary hyperparathyroidism: Secondary | ICD-10-CM | POA: Diagnosis not present

## 2020-03-28 NOTE — Chronic Care Management (AMB) (Signed)
Chronic Care Management Pharmacy Assistant   Name: CHAYAH MCKEE  MRN: [id number] DOB: February 16, 1941  Reason for Encounter: Disease State/ Hypertension and Lipids Adherence Call.  Patient Questions:  1.  Have you seen any other providers since your last visit? No  2.  Any changes in your medicines or health? No   PCP : [name], MD  Allergies:   Allergies  Allergen Reactions  . Contrast Media [Iodinated Diagnostic Agents] Hives, Itching and Rash  . Shellfish Allergy Nausea And Vomiting  . Shellfish-Derived Products Nausea And Vomiting    Medications: Outpatient Encounter Medications as of 03/28/2020  Medication Sig  . alendronate (FOSAMAX) 70 MG tablet Take 1 tablet (70 mg total) by mouth once a week. Take with a full glass of water on an empty stomach.  Marland Kitchen amLODipine (NORVASC) 10 MG tablet Take 10 mg by mouth every morning.  . cyclobenzaprine (FLEXERIL) 10 MG tablet Take 1 tablet (10 mg total) by mouth 3 (three) times daily as needed for muscle spasms.  Marland Kitchen esomeprazole (NEXIUM) 40 MG capsule Take 40 mg by mouth daily as needed.   . formoterol (PERFOROMIST) 20 MCG/2ML nebulizer solution USE 1 VIAL  IN  NEBULIZER TWICE  DAILY - -Morning and Evening  . revefenacin (YUPELRI) 175 MCG/3ML nebulizer solution USE 1 VIAL IN NEBULIZER DAILY  . rOPINIRole (REQUIP) 2 MG tablet Take 1 tablet (2 mg total) by mouth in the morning and at bedtime.  . simvastatin (ZOCOR) 40 MG tablet TAKE 1 TABLET BY MOUTH EVERY DAY   No facility-administered encounter medications on file as of 03/28/2020.    Current Diagnosis: Patient Active Problem List   Diagnosis Date Noted  . Contusion of ala nasi 03/02/2020  . Contusion of both eyes 03/02/2020  . Chest wall pain 03/02/2020  . 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 Chronic kidney disease, stage 3a ([place]) 08/03/2019  . 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  . Bladder  cancer ([place]) 08/05/2018  . H/O left nephrectomy 08/05/2018  . COPD, moderate ([place]) 03/20/2018  . DOE (dyspnea on exertion) 01/08/2018  . Ex-smoker 01/08/2018  . Hyperparathyroidism, primary ([place]) 06/25/2016  . Essential hypertension 02/27/2016  . Hyperlipemia 02/27/2016  . History of kidney cancer 02/27/2016  . Age related osteoporosis 02/27/2016  . Spinal stenosis 02/27/2016   Reviewed chart prior to disease state call. Spoke with patient regarding BP  Recent Office Vitals: BP Readings from Last 3 Encounters:  03/28/20 128/68  03/02/20 (!) 150/80  02/17/20 (!) 150/80   Pulse Readings from Last 3 Encounters:  03/28/20 82  02/17/20 75  02/09/20 96    Wt Readings from Last 3 Encounters:  03/28/20 167 lb 6.4 oz (75.9 kg)  03/02/20 159 lb (72.1 kg)  02/17/20 165 lb (74.8 kg)     Kidney Function Lab Results  Component Value Date/Time   CREATININE 1.68 (H) 11/24/2019 12:00 AM   CREATININE 1.90 (H) 08/04/2019 07:59 AM   GFRNONAA 29 (L) 11/24/2019 12:00 AM   GFRAA 34 (L) 11/24/2019 12:00 AM    BMP Latest Ref Rng & Units 11/24/2019 08/04/2019 02/27/2018  Glucose 65 - 99 mg/dL 102(H) 108(H) 78  BUN 8 - 27 mg/dL 27 32(H) 30(H)  Creatinine 0.57 - 1.00 mg/dL 1.68(H) 1.90(H) 1.78(H)  BUN/Creat Ratio 12 - 28 16 17 17   Sodium 134 - 144 mmol/L 137 140 144  Potassium 3.5 - 5.2 mmol/L 4.7 4.8 5.2  Chloride 96 - 106 mmol/L  103 106 103  CO2 20 - 29 mmol/L 19(L) 16(L) 21  Calcium 8.7 - 10.3 mg/dL 9.2 9.1 9.5    . Current antihypertensive regimen:  Amlodipine 10 mg daily . How often are you checking your Blood Pressure? 1-2x per week . Current home BP readings: 128/68 . What recent interventions/DTPs have been made by any provider to improve Blood Pressure control since last CPP Visit? Patient states that she is following goals and plan to eat healthier options, exercise extensively and, continuing current medications.   . Any recent hospitalizations or ED visits since last visit with  CPP? No . What diet changes have been made to improve Blood Pressure Control?  o Patient states she is watching what foods she intakes. . What exercise is being done to improve your Blood Pressure Control?  o Patient states that she is walking daily.  Adherence Review: Is the patient currently on ACE/ARB medication? No Does the patient have >5 day gap between last estimated fill dates? No  03/28/2020 Name: KHADEJAH SON MRN: [id number] DOB: 10-20-1940 JIANNA DRABIK is a 79 y.o. year old female who is a primary care patient of [name], MD.  Comprehensive medication review performed; Spoke to patient regarding cholesterol  Lipid Panel    Component Value Date/Time   CHOL 158 11/24/2019 0000   TRIG 130 11/24/2019 0000   HDL 49 11/24/2019 0000   LDLCALC 86 11/24/2019 0000    10-year ASCVD risk score: The 10-year ASCVD risk score Mikey Bussing DC Brooke Bonito., et al., 2013) is: 25.3%   Values used to calculate the score:     Age: 79 years     Sex: Female     Is Non-Hispanic African American: No     Diabetic: No     Tobacco smoker: No     Systolic Blood Pressure: 947 mmHg     Is BP treated: Yes     HDL Cholesterol: 49 mg/dL     Total Cholesterol: 158 mg/dL  . Current antihyperlipidemic regimen:  Simvastatin 40 mg daily . Previous antihyperlipidemic medications tried: None identified.  . ASCVD risk enhancing conditions: age >13, DM, HTN, CKD, CHF,none identified.  . What recent interventions/DTPs have been made by any provider to improve Cholesterol control since last CPP Visit:Patient states that she is following goals and plan to eat healthier options, exercise extensively and, continuing current medications.   .  . Any recent hospitalizations or ED visits since last visit with CPP? No . What diet changes have been made to improve Cholesterol? Patient states she is watching what foods she intakes. o  . What exercise is being done to improve Cholesterol?  o Patient states that  she is walking daily.  Adherence Review: Does the patient have >5 day gap between last estimated fill dates? No  Goals Addressed   None     Follow-Up:  Pharmacist Review   Sherre Poot, CPP. Notified.  [employer] Pharmacist Assistant  [phone]

## 2020-03-28 NOTE — Progress Notes (Signed)
Subjective:  Patient ID: Alexandra [name], female    DOB: 30-May-1941  Age: 79 y.o. MRN: [id number]  Chief Complaint  Patient presents with  . Hypertension  . Chronic Kidney Disease    HPI: chronic visit  Patient presents for follow up of hypertension.  Patient tolerating amlodipine,  well with side effects.  Patient was diagnosed with hypertension 2010 so has been treated for hypertension for 10 years.Patient is working on maintaining diet and exercise regimen and follows up as directed. Complication include none.  Patient has stage 3a kidney disease and it is stable  Patient presents with hyperlipidemia.  Compliance with treatment has been good; patient takes medicines as directed, maintains low cholesterol diet, follows up as directed, and maintains exercise regimen.  Patient is using simvatatin without problems.   Current Outpatient Medications on File Prior to Visit  Medication Sig Dispense Refill  . alendronate (FOSAMAX) 70 MG tablet Take 1 tablet (70 mg total) by mouth once a week. Take with a full glass of water on an empty stomach. 12 tablet 3  . amLODipine (NORVASC) 10 MG tablet Take 10 mg by mouth every morning.    . cyclobenzaprine (FLEXERIL) 10 MG tablet Take 1 tablet (10 mg total) by mouth 3 (three) times daily as needed for muscle spasms. 30 tablet 0  . esomeprazole (NEXIUM) 40 MG capsule Take 40 mg by mouth daily as needed.     . formoterol (PERFOROMIST) 20 MCG/2ML nebulizer solution USE 1 VIAL  IN  NEBULIZER TWICE  DAILY - -Morning and Evening 60 mL 1  . revefenacin (YUPELRI) 175 MCG/3ML nebulizer solution USE 1 VIAL IN NEBULIZER DAILY 30 mL 1  . rOPINIRole (REQUIP) 2 MG tablet Take 1 tablet (2 mg total) by mouth in the morning and at bedtime. 180 tablet 6  . simvastatin (ZOCOR) 40 MG tablet TAKE 1 TABLET BY MOUTH EVERY DAY 90 tablet 2   No current facility-administered medications on file prior to visit.   Past Medical History:  Diagnosis Date  . Restless legs  syndrome    Past Surgical History:  Procedure Laterality Date  . ABDOMINAL HYSTERECTOMY     complete  . BACK SURGERY  2016   lower L 4 to L 5  . bladder tack and uterus lift  10/10/2015  . IR RADIOLOGIST EVAL & MGMT  12/03/2018  . IR RADIOLOGIST EVAL & MGMT  12/31/2018  . left nephrectomy  1999  . PARATHYROIDECTOMY Left 07/02/2016   Procedure: LEFT INFERIOR PARATHYROIDECTOMY;  Surgeon: Armandina Gemma, MD;  Location: [facility];  Service: General;  Laterality: Left;  . SHOULDER OPEN ROTATOR CUFF REPAIR  12/2019    Family History  Problem Relation Age of Onset  . Emphysema Mother    Social History   Socioeconomic History  . Marital status: Married    Spouse name: Not on file  . Number of children: Not on file  . Years of education: Not on file  . Highest education level: Not on file  Occupational History  . Not on file  Tobacco Use  . Smoking status: Former Smoker    Packs/day: 1.00    Years: 50.00    Pack years: 50.00    Types: Cigarettes    Quit date: 06/11/2005    Years since quitting: 14.8  . Smokeless tobacco: Never Used  Vaping Use  . Vaping Use: Never used  Substance and Sexual Activity  . Alcohol use: No  . Drug use: No  . Sexual activity:  Yes    Partners: Male  Other Topics Concern  . Not on file  Social History Narrative  . Not on file   Social Determinants of Health   Financial Resource Strain:   . Difficulty of Paying Living Expenses: Not on file  Food Insecurity: No Food Insecurity  . Worried About Charity fundraiser in the Last Year: Never true  . Ran Out of Food in the Last Year: Never true  Transportation Needs: No Transportation Needs  . Lack of Transportation (Medical): No  . Lack of Transportation (Non-Medical): No  Physical Activity:   . Days of Exercise per Week: Not on file  . Minutes of Exercise per Session: Not on file  Stress:   . Feeling of Stress : Not on file  Social Connections:   . Frequency of Communication with Friends and Family:  Not on file  . Frequency of Social Gatherings with Friends and Family: Not on file  . Attends Religious Services: Not on file  . Active Member of Clubs or Organizations: Not on file  . Attends Archivist Meetings: Not on file  . Marital Status: Not on file    Review of Systems  Constitutional: Negative.   HENT: Negative.   Respiratory: Positive for shortness of breath.        Sob walking steps  Cardiovascular: Negative for chest pain, palpitations and leg swelling.  Gastrointestinal: Negative.   Genitourinary: Negative.   Musculoskeletal: Positive for back pain (to get epidurals).  Skin: Negative.   Neurological: Negative.   Psychiatric/Behavioral: Negative.      Objective:  BP 128/68   Pulse 82   Temp 97.7 F (36.5 C)   Resp 16   Ht 5\' 3"  (1.6 m)   Wt 167 lb 6.4 oz (75.9 kg)   BMI 29.65 kg/m   BP/Weight 03/28/2020 2/45/8099 01/12/3824  Systolic BP 053 976 734  Diastolic BP 68 80 80  Wt. (Lbs) 167.4 159 165  BMI 29.65 28.17 29.23    Physical Exam Vitals reviewed.  Constitutional:      Appearance: Normal appearance.  HENT:     Head: Normocephalic and atraumatic.     Right Ear: Tympanic membrane, ear canal and external ear normal.     Left Ear: Tympanic membrane, ear canal and external ear normal.     Nose: Nose normal.     Mouth/Throat:     Mouth: Mucous membranes are moist.     Pharynx: Oropharynx is clear.  Eyes:     Extraocular Movements: Extraocular movements intact.     Conjunctiva/sclera: Conjunctivae normal.     Pupils: Pupils are equal, round, and reactive to light.  Cardiovascular:     Rate and Rhythm: Normal rate and regular rhythm.     Pulses: Normal pulses.     Heart sounds: Normal heart sounds.  Pulmonary:     Effort: Pulmonary effort is normal.     Breath sounds: Normal breath sounds.  Abdominal:     General: Abdomen is flat. Bowel sounds are normal.     Palpations: Abdomen is soft.  Musculoskeletal:        General: Normal range  of motion.     Cervical back: Normal range of motion and neck supple.  Skin:    General: Skin is warm.     Capillary Refill: Capillary refill takes less than 2 seconds.  Neurological:     General: No focal deficit present.     Mental Status: She is alert  and oriented to person, place, and time. Mental status is at baseline.  Psychiatric:        Mood and Affect: Mood normal.        Thought Content: Thought content normal.        Judgment: Judgment normal.       Lab Results  Component Value Date   WBC 11.6 (H) 11/24/2019   HGB 12.0 11/24/2019   HCT 36.6 11/24/2019   PLT 419 11/24/2019   GLUCOSE 102 (H) 11/24/2019   CHOL 158 11/24/2019   TRIG 130 11/24/2019   HDL 49 11/24/2019   LDLCALC 86 11/24/2019   ALT 12 11/24/2019   AST 16 11/24/2019   NA 137 11/24/2019   K 4.7 11/24/2019   CL 103 11/24/2019   CREATININE 1.68 (H) 11/24/2019   BUN 27 11/24/2019   CO2 19 (L) 11/24/2019      Assessment & Plan:   1. Malignant neoplasm of urinary bladder, unspecified site [facility]) Patient is being treated for bladder cancer and is stable  2. Hyperparathyroidism, primary ([place]) Patient has primary hyperparathyroidism but BP and kidney tests are stable  3. Mixed conductive and sensorineural hearing loss, bilateral Patient needs hearing aids  4. Essential hypertension An individual hypertension care plan was established and reinforced today.  The patient's status was assessed using clinical findings on exam and labs or diagnostic tests. The patient's success at meeting treatment goals on disease specific evidence-based guidelines and found to be well controlled. SELF MANAGEMENT: The patient and I together assessed ways to personally work towards obtaining the recommended goals. RECOMMENDATIONS: avoid decongestants found in common cold remedies, decrease consumption of alcohol, perform routine monitoring of BP with home BP cuff, exercise, reduction of dietary salt, take medicines as  prescribed, try not to miss doses and quit smoking.  Regular exercise and maintaining a healthy weight is needed.  Stress reduction may help. A CLINICAL SUMMARY including written plan identify barriers to care unique to individual due to social or financial issues.  We attempt to mutually creat solutions for individual and family understanding.  5. COPD, moderate ([place]) An individualize plan was formulated for care of COPD.  Treatment is evidence based.  She will continue on inhalers, avoid smoking and smoke.  Regular exercise with help with dyspnea. Routine follow ups and medication compliance is needed.  6. Gastroesophageal reflux disease with esophagitis without hemorrhage Plan of care was formulated today.  She is doing well.  A plan of care was formulated using patient exam, tests and other sources to optimize care using evidence based information.  Recommend no smoking, no eating after supper, avoid fatty foods, elevate Head of bed, avoid tight fitting clothing.  Continue on nexium.         Follow-up: Return in about 6 months (around 09/26/2020) for fasting.  An After Visit Summary was printed and given to the patient.  [place] [phone]

## 2020-03-29 LAB — CBC WITH DIFFERENTIAL/PLATELET
Basophils Absolute: 0.1 10*3/uL (ref 0.0–0.2)
Basos: 1 %
EOS (ABSOLUTE): 0.1 10*3/uL (ref 0.0–0.4)
Eos: 1 %
Hematocrit: 36.3 % (ref 34.0–46.6)
Hemoglobin: 11.8 g/dL (ref 11.1–15.9)
Immature Grans (Abs): 0.1 10*3/uL (ref 0.0–0.1)
Immature Granulocytes: 1 %
Lymphocytes Absolute: 2.2 10*3/uL (ref 0.7–3.1)
Lymphs: 20 %
MCH: 30.2 pg (ref 26.6–33.0)
MCHC: 32.5 g/dL (ref 31.5–35.7)
MCV: 93 fL (ref 79–97)
Monocytes Absolute: 0.8 10*3/uL (ref 0.1–0.9)
Monocytes: 8 %
Neutrophils Absolute: 7.6 10*3/uL — ABNORMAL HIGH (ref 1.4–7.0)
Neutrophils: 69 %
Platelets: 432 10*3/uL (ref 150–450)
RBC: 3.91 x10E6/uL (ref 3.77–5.28)
RDW: 14.1 % (ref 11.7–15.4)
WBC: 10.9 10*3/uL — ABNORMAL HIGH (ref 3.4–10.8)

## 2020-03-29 LAB — COMPREHENSIVE METABOLIC PANEL
ALT: 14 IU/L (ref 0–32)
AST: 13 IU/L (ref 0–40)
Albumin/Globulin Ratio: 1.4 (ref 1.2–2.2)
Albumin: 3.6 g/dL — ABNORMAL LOW (ref 3.7–4.7)
Alkaline Phosphatase: 104 IU/L (ref 44–121)
BUN/Creatinine Ratio: 14 (ref 12–28)
BUN: 26 mg/dL (ref 8–27)
Bilirubin Total: 0.2 mg/dL (ref 0.0–1.2)
CO2: 21 mmol/L (ref 20–29)
Calcium: 9.4 mg/dL (ref 8.7–10.3)
Chloride: 105 mmol/L (ref 96–106)
Creatinine, Ser: 1.91 mg/dL — ABNORMAL HIGH (ref 0.57–1.00)
GFR calc Af Amer: 29 mL/min/{1.73_m2} — ABNORMAL LOW (ref >59)
GFR calc non Af Amer: 25 mL/min/{1.73_m2} — ABNORMAL LOW (ref >59)
Globulin, Total: 2.5 g/dL (ref 1.5–4.5)
Glucose: 103 mg/dL — ABNORMAL HIGH (ref 65–99)
Potassium: 4.9 mmol/L (ref 3.5–5.2)
Sodium: 141 mmol/L (ref 134–144)
Total Protein: 6.1 g/dL (ref 6.0–8.5)

## 2020-03-29 LAB — LIPID PANEL
Chol/HDL Ratio: 3 ratio (ref 0.0–4.4)
Cholesterol, Total: 149 mg/dL (ref 100–199)
HDL: 50 mg/dL (ref >39)
LDL Chol Calc (NIH): 77 mg/dL (ref 0–99)
Triglycerides: 122 mg/dL (ref 0–149)
VLDL Cholesterol Cal: 22 mg/dL (ref 5–40)

## 2020-03-29 LAB — CARDIOVASCULAR RISK ASSESSMENT

## 2020-03-29 NOTE — Progress Notes (Signed)
Wbc still 10.9 she remains lightly high for last 2 years, glucose 103, kidneys still stage 4, cholesterol normal,

## 2020-04-04 DIAGNOSIS — M47816 Spondylosis without myelopathy or radiculopathy, lumbar region: Secondary | ICD-10-CM | POA: Diagnosis not present

## 2020-04-04 DIAGNOSIS — M25551 Pain in right hip: Secondary | ICD-10-CM | POA: Diagnosis not present

## 2020-04-12 ENCOUNTER — Ambulatory Visit (INDEPENDENT_AMBULATORY_CARE_PROVIDER_SITE_OTHER): Payer: Medicare Other

## 2020-04-12 DIAGNOSIS — Z23 Encounter for immunization: Secondary | ICD-10-CM

## 2020-04-12 NOTE — Progress Notes (Signed)
   Covid-19 Vaccination Clinic  Name:  Alexandra [name]    MRN: [id number] DOB: 1940-07-30  04/12/2020  Alexandra [name] was observed post Covid-19 immunization for 15 minutes without incident. She was provided with Vaccine Information Sheet and instruction to access the V-Safe system.   Alexandra [name] was instructed to call [phone] with any severe reactions post vaccine: Marland Kitchen Difficulty breathing  . Swelling of face and throat  . A fast heartbeat  . A bad rash all over body  . Dizziness and weakness

## 2020-04-19 ENCOUNTER — Other Ambulatory Visit: Payer: Self-pay

## 2020-04-19 ENCOUNTER — Ambulatory Visit: Payer: Medicare Other

## 2020-04-19 DIAGNOSIS — I10 Essential (primary) hypertension: Secondary | ICD-10-CM

## 2020-04-19 DIAGNOSIS — E782 Mixed hyperlipidemia: Secondary | ICD-10-CM

## 2020-04-19 NOTE — Chronic Care Management (AMB) (Signed)
Chronic Care Management Pharmacy  Name: Alexandra [name]  MRN: [id number] DOB: January 30, 1941  Chief Complaint/ HPI  Alexandra [name],  79 y.o. , female presents for their Follow-Up CCM visit with the clinical pharmacist via telephone due to COVID-19 Pandemic.  PCP : [name], MD  Their chronic conditions include: COPD, HTN, Reflux esophagitis, Osteoporosis, HLD.   Office Visits: 04/19/2020 - third Moderna shot.  03/28/2020 - continue current medications. Needs hearing aids. Labs drawn.  03/02/2020 - patient had a fall. Ordering rib x-ray for right chest wall pain.  02/17/2020 - patient received flu shot. Referral to ortho and given flexeril for muscle spasm.  02/09/2020 - sciatica - hydrocodone given and SI injection performed in office.  11/24/2019 - preoperative clearance. Wbc elevated- chronic, no anemia, glucose 102. Kidney tests stable consider nephrology consult, liver tests ok, Cholesterol normal Consult Visit: 02/24/2020 - Urology - cytoscopy performed.  12/08/2019 - ortho - follow-up after rotator cuff repair.  11/25/2019 - Rotator Cuff repair.  Medications: Outpatient Encounter Medications as of 04/19/2020  Medication Sig  . alendronate (FOSAMAX) 70 MG tablet Take 1 tablet (70 mg total) by mouth once a week. Take with a full glass of water on an empty stomach.  Marland Kitchen amLODipine (NORVASC) 10 MG tablet Take 10 mg by mouth every morning.  . cyclobenzaprine (FLEXERIL) 10 MG tablet Take 1 tablet (10 mg total) by mouth 3 (three) times daily as needed for muscle spasms.  Marland Kitchen esomeprazole (NEXIUM) 40 MG capsule Take 40 mg by mouth daily as needed.   . formoterol (PERFOROMIST) 20 MCG/2ML nebulizer solution USE 1 VIAL  IN  NEBULIZER TWICE  DAILY - -Morning and Evening  . revefenacin (YUPELRI) 175 MCG/3ML nebulizer solution USE 1 VIAL IN NEBULIZER DAILY  . rOPINIRole (REQUIP) 2 MG tablet Take 1 tablet (2 mg total) by mouth in the morning and at bedtime.  . simvastatin (ZOCOR) 40  MG tablet TAKE 1 TABLET BY MOUTH EVERY DAY   No facility-administered encounter medications on file as of 04/19/2020.   Allergies  Allergen Reactions  . Contrast Media [Iodinated Diagnostic Agents] Hives, Itching and Rash  . Shellfish Allergy Nausea And Vomiting  . Shellfish-Derived Products Nausea And Vomiting   SDOH Screenings   Alcohol Screen:   . Last Alcohol Screening Score (AUDIT): Not on file  Depression (PHQ2-9): Low Risk   . PHQ-2 Score: 0  Financial Resource Strain:   . Difficulty of Paying Living Expenses: Not on file  Food Insecurity: No Food Insecurity  . Worried About Charity fundraiser in the Last Year: Never true  . Ran Out of Food in the Last Year: Never true  Housing: Low Risk   . Last Housing Risk Score: 0  Physical Activity:   . Days of Exercise per Week: Not on file  . Minutes of Exercise per Session: Not on file  Social Connections:   . Frequency of Communication with Friends and Family: Not on file  . Frequency of Social Gatherings with Friends and Family: Not on file  . Attends Religious Services: Not on file  . Active Member of Clubs or Organizations: Not on file  . Attends Archivist Meetings: Not on file  . Marital Status: Not on file  Stress:   . Feeling of Stress : Not on file  Tobacco Use: Medium Risk  . Smoking Tobacco Use: Former Smoker  . Smokeless Tobacco Use: Never Used  Transportation Needs: No Transportation Needs  . Lack of  Transportation (Medical): No  . Lack of Transportation (Non-Medical): No    Current Diagnosis/Assessment:  Goals Addressed            This Visit's Progress   . Pharmacy Care Plan       CARE PLAN ENTRY (see longitudinal plan of care for additional care plan information)  Current Barriers:  . Chronic Disease Management support, education, and care coordination needs related to Hypertension and Hyperlipidemia   Hypertension BP Readings from Last 3 Encounters:  03/28/20 128/68  03/02/20 (!)  150/80  02/17/20 (!) 150/80   . Pharmacist Clinical Goal(s): o Over the next 90 days, patient will work with PharmD and providers to maintain BP goal <130/80 . Current regimen:  o Amlodipine 10 mg daily . Interventions: o Use caution to avoid falls.  o Discussed healthy diet and staying active.  . Patient self care activities - Over the next 90 days, patient will: o Check BP weekly, document, and provide at future appointments o Ensure daily salt intake < 2300 mg/day  Hyperlipidemia Lab Results  Component Value Date/Time   LDLCALC 77 03/28/2020 07:53 AM   . Pharmacist Clinical Goal(s): o Over the next 90 days, patient will work with PharmD and providers to maintain LDL goal < 100 . Current regimen:  o Simvastatin 40 mg daily . Interventions: o Discussed healthy diet and staying active.  o Reviewed medications.  . Patient self care activities - Over the next 90 days, patient will: o Continue to take medication as prescribed.  Medication management . Pharmacist Clinical Goal(s): o Over the next 90 days, patient will work with PharmD and providers to maintain optimal medication adherence . Current pharmacy: [facility] . Interventions o Comprehensive medication review performed. o Continue current medication management strategy . Patient self care activities - Over the next 90 days, patient will: o Focus on medication adherence by using pill box o Take medications as prescribed o Report any questions or concerns to PharmD and/or provider(s)  Please see past updates related to this goal by clicking on the "Past Updates" button in the selected goal          COPD     Eosinophil count:  No results found for: EOSPCT%                               Eos (Absolute):  Lab Results  Component Value Date/Time   EOSABS 0.1 03/28/2020 07:53 AM    Tobacco Status:  Social History   Tobacco Use  Smoking Status Former Smoker  . Packs/day: 1.00  . Years: 50.00  . Pack years:  50.00  . Types: Cigarettes  . Quit date: 06/11/2005  . Years since quitting: 14.8  Smokeless Tobacco Never Used    Patient has failed these meds in past: albuterol hfa inhaler, anoro ellipta Patient is currently controlled on the following medications:   perforomist 20 mcg/66ml bid  yupleri Using maintenance inhaler regularly? No  We discussed:  Patient denies recent flares or concerns. Receives medications at no cost through mail order pharmacy.     Plan  Continue current medications  and  Hypertension   BP today is:  <140/90  Office blood pressures are  BP Readings from Last 3 Encounters:  03/28/20 128/68  03/02/20 (!) 150/80  02/17/20 (!) 150/80    Patient has failed these meds in the past: n/a Patient is currently controlled on the following medications:   amlodipine  10 mg daily Patient checks BP at home weekly  Patient home BP readings are ranging: 120/80  We discussed diet and exercise extensively. Patient reports good blood pressure control.   Plan  Continue current medications   Hyperlipidemia   Lipid Panel     Component Value Date/Time   CHOL 149 03/28/2020 0753   TRIG 122 03/28/2020 0753   HDL 50 03/28/2020 0753   CHOLHDL 3.0 03/28/2020 0753   LDLCALC 77 03/28/2020 0753   LABVLDL 22 03/28/2020 0753     The 10-year ASCVD risk score (Goff DC Jr., et al., 2013) is: 25.4%   Values used to calculate the score:     Age: 85 years     Sex: Female     Is Non-Hispanic African American: No     Diabetic: No     Tobacco smoker: No     Systolic Blood Pressure: 235 mmHg     Is BP treated: Yes     HDL Cholesterol: 50 mg/dL     Total Cholesterol: 149 mg/dL   Patient has failed these meds in past: n/a Patient is currently controlled on the following medications:   simvastatin 40 mg daily  We discussed:  diet and exercise extensively. Patient working to stay active and avoid falls. Patient has had several falls at home. States she gets tripped up.    Plan  Continue current medications  Osteoporosis   No results found for: VD25OH    Patient has failed these meds in past: none reported Patient is currently controlled on the following medications:  . Alendronate 70 mg weekly   We discussed:  Recommend [id number] units of vitamin D daily. Recommend 1200 mg of calcium daily from dietary and supplemental sources. Recommend weight-bearing and muscle strengthening exercises for building and maintaining bone density.  Plan  Continue current medications. Recommend Vitamin D with next labs.     GERD   Patient has failed these meds in past: none noted Patient is currently controlled on the following medications:   esomeprazole 40 mg daily prn  We discussed:  diet and exercise extensively. Patient controls diet to prevent flares.    Plan  Continue current medications   Vaccines   Reviewed and discussed patient's vaccination history. Has had both COVID vaccines.   Immunization History  Administered Date(s) Administered  . Fluad Quad(high Dose 65+) 03/25/2019, 02/17/2020  . Influenza, High Dose Seasonal PF 03/27/2017, 04/01/2018  . Moderna SARS-COV2 Booster Vaccination 04/12/2020  . Pneumococcal Conjugate-13 10/15/2013, 03/27/2017  . Pneumococcal-Unspecified 10/15/2013  . Zoster 11/23/2010    Plan  Recommended patient receive annual flu vaccine in office. Patient will discuss with Dr. Henrene Pastor about Shingrix.  Medication Management   Pt uses [facility] for all medications Weekly pill box Pt endorses 100% compliance  We discussed: Keeping medications organized.   Reviewed refill history of cholesterol and blood pressure medication. Patient adherent based on refill history.   Plan  Recommend continuing to use pill box for good adherence.      Follow up: 6 months with Husband's visit.

## 2020-04-22 NOTE — Patient Instructions (Signed)
Visit Information  Goals Addressed            This Visit's Progress   . Pharmacy Care Plan       CARE PLAN ENTRY (see longitudinal plan of care for additional care plan information)  Current Barriers:  . Chronic Disease Management support, education, and care coordination needs related to Hypertension and Hyperlipidemia   Hypertension BP Readings from Last 3 Encounters:  03/28/20 128/68  03/02/20 (!) 150/80  02/17/20 (!) 150/80   . Pharmacist Clinical Goal(s): o Over the next 90 days, patient will work with PharmD and providers to maintain BP goal <130/80 . Current regimen:  o Amlodipine 10 mg daily . Interventions: o Use caution to avoid falls.  o Discussed healthy diet and staying active.  . Patient self care activities - Over the next 90 days, patient will: o Check BP weekly, document, and provide at future appointments o Ensure daily salt intake < 2300 mg/day  Hyperlipidemia Lab Results  Component Value Date/Time   LDLCALC 77 03/28/2020 07:53 AM   . Pharmacist Clinical Goal(s): o Over the next 90 days, patient will work with PharmD and providers to maintain LDL goal < 100 . Current regimen:  o Simvastatin 40 mg daily . Interventions: o Discussed healthy diet and staying active.  o Reviewed medications.  . Patient self care activities - Over the next 90 days, patient will: o Continue to take medication as prescribed.  Medication management . Pharmacist Clinical Goal(s): o Over the next 90 days, patient will work with PharmD and providers to maintain optimal medication adherence . Current pharmacy: [facility] . Interventions o Comprehensive medication review performed. o Continue current medication management strategy . Patient self care activities - Over the next 90 days, patient will: o Focus on medication adherence by using pill box o Take medications as prescribed o Report any questions or concerns to PharmD and/or provider(s)  Please see past updates  related to this goal by clicking on the "Past Updates" button in the selected goal          The patient verbalized understanding of instructions, educational materials, and care plan provided today.   Telephone follow up appointment with pharmacy team member scheduled for: 10/2020  Sherre Poot, PharmD, [facility] Clinical Pharmacist [facility] [phone] (office) [phone] (mobile)

## 2020-04-25 IMAGING — DX DG CHEST 2V
2 series · 2 of 2 positions shown · non-contrast
Comparison: None.

CLINICAL DATA: Chronic shortness of breath

EXAM:
CHEST - 2 VIEW

[chest pa]
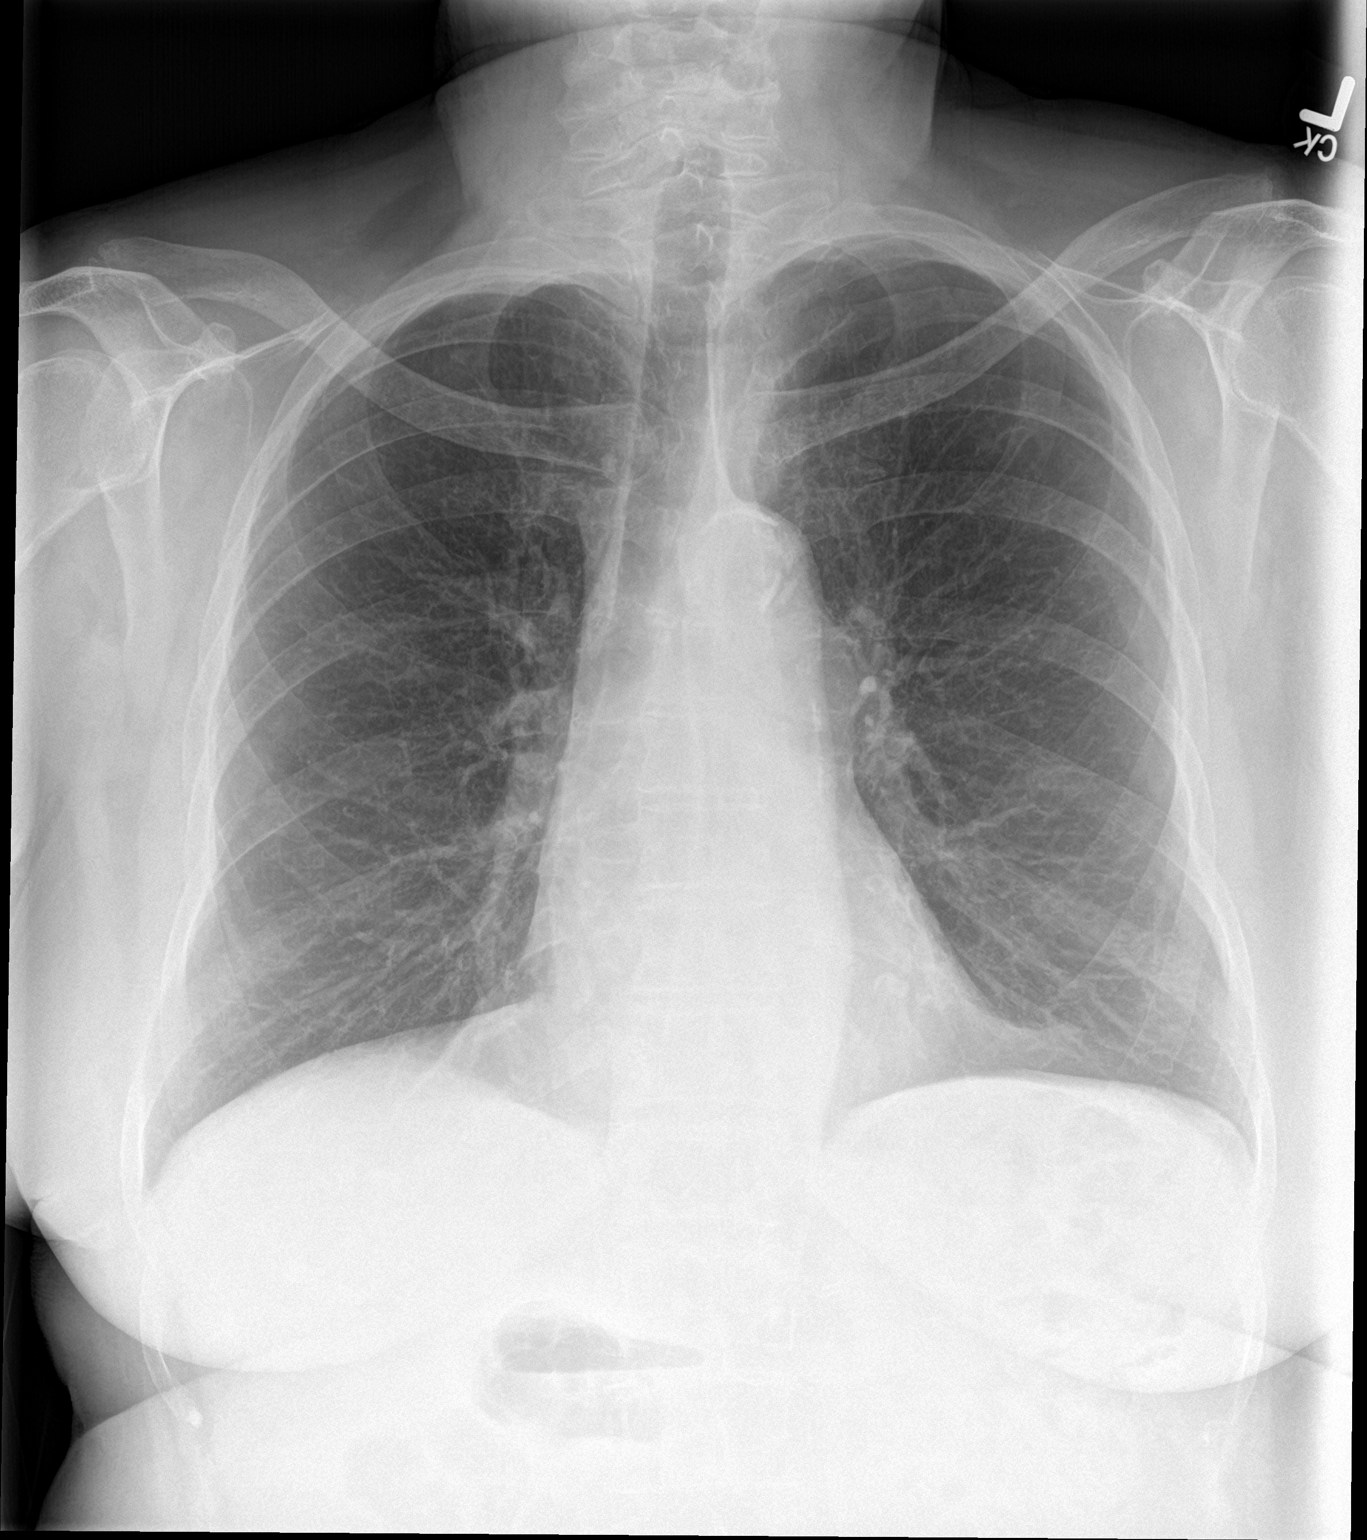

[chest lat]
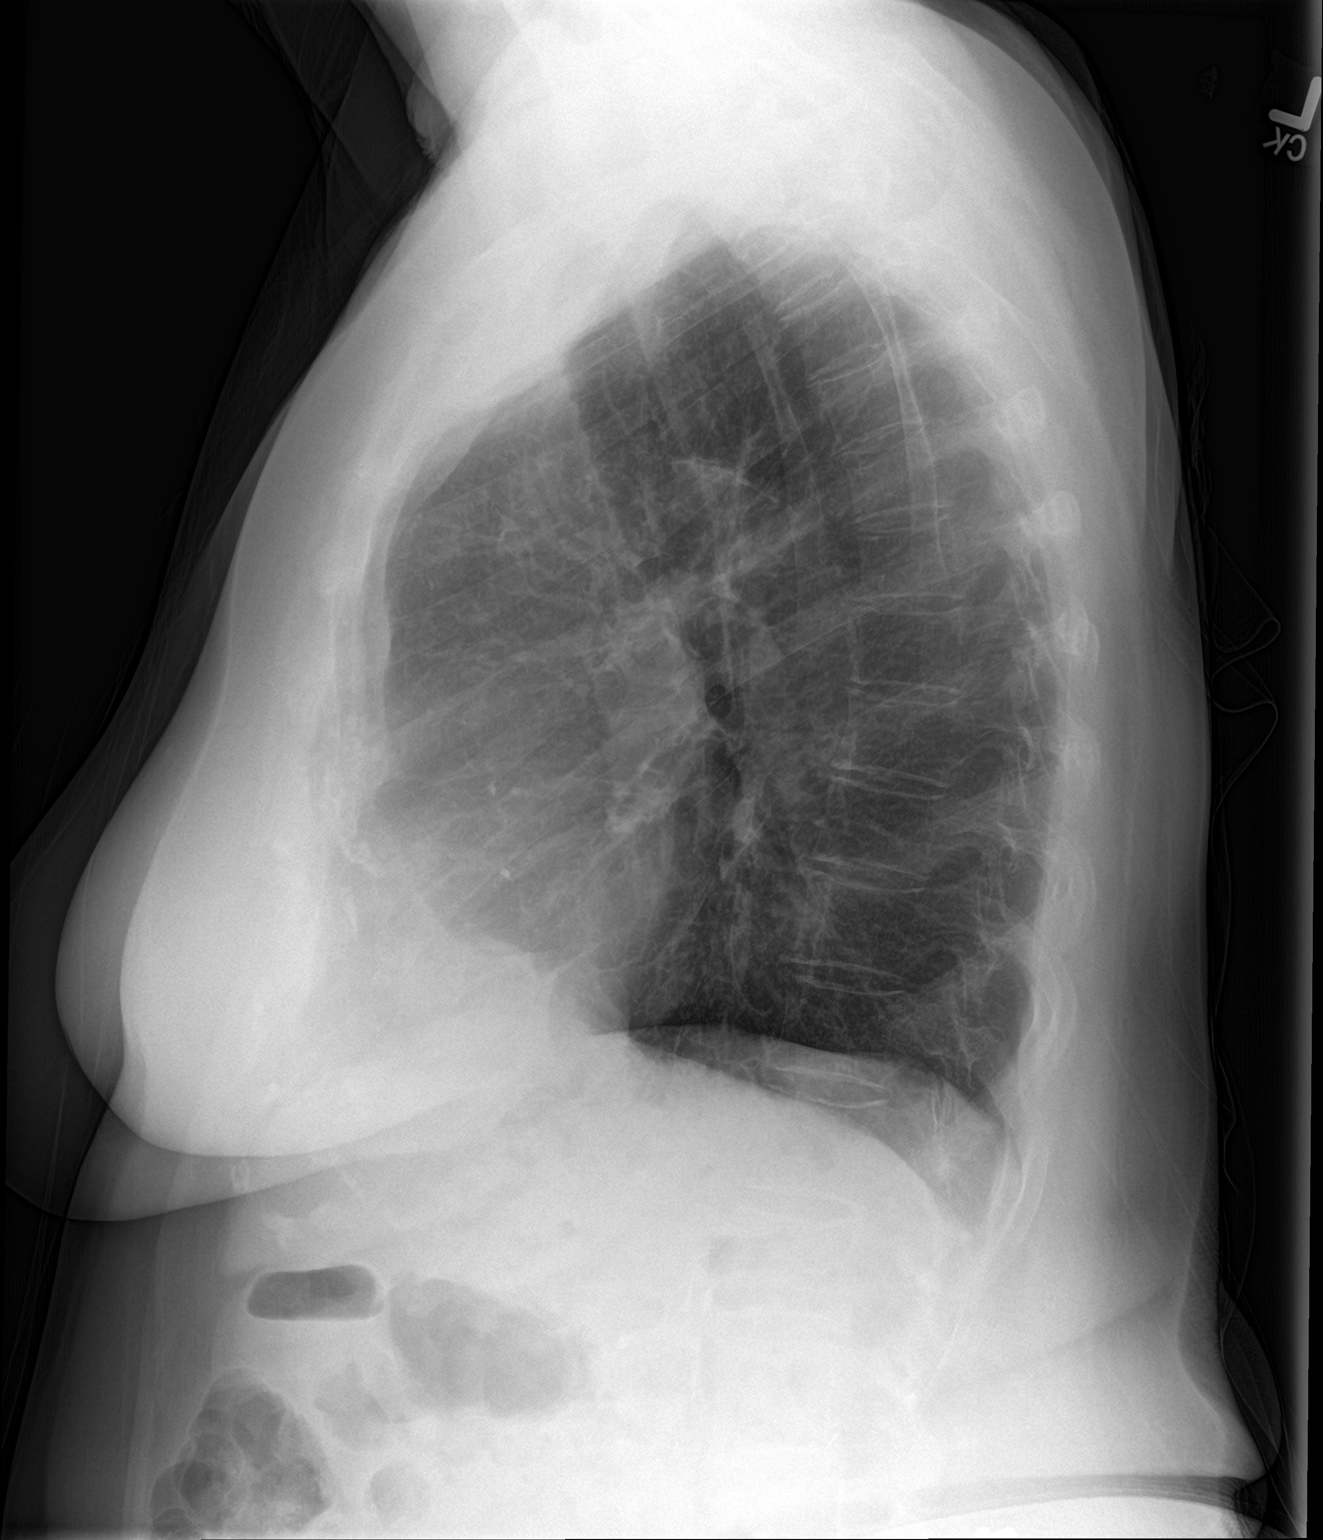

[2 of 2 positions shown; findings below may reference images not displayed]

FINDINGS: Atherosclerotic calcification of the aortic arch. Mild thoracic
spondylosis.

The lungs appear clear.  Cardiac and mediastinal contours normal.

No pleural effusion identified.
IMPRESSION: 1.  No active cardiopulmonary disease is radiographically apparent.
2.  Aortic Atherosclerosis ([id number]).

## 2020-05-10 DIAGNOSIS — S82132A Displaced fracture of medial condyle of left tibia, initial encounter for closed fracture: Secondary | ICD-10-CM | POA: Diagnosis not present

## 2020-05-19 DIAGNOSIS — M47816 Spondylosis without myelopathy or radiculopathy, lumbar region: Secondary | ICD-10-CM | POA: Diagnosis not present

## 2020-05-19 DIAGNOSIS — Z981 Arthrodesis status: Secondary | ICD-10-CM | POA: Diagnosis not present

## 2020-05-25 DIAGNOSIS — N2889 Other specified disorders of kidney and ureter: Secondary | ICD-10-CM | POA: Diagnosis not present

## 2020-05-25 DIAGNOSIS — C672 Malignant neoplasm of lateral wall of bladder: Secondary | ICD-10-CM | POA: Diagnosis not present

## 2020-05-26 DIAGNOSIS — M25462 Effusion, left knee: Secondary | ICD-10-CM | POA: Diagnosis not present

## 2020-07-03 ENCOUNTER — Other Ambulatory Visit: Payer: Self-pay | Admitting: Legal Medicine

## 2020-07-04 ENCOUNTER — Telehealth: Payer: Self-pay

## 2020-07-04 NOTE — Progress Notes (Signed)
Chronic Care Management Pharmacy Assistant   Name: Alexandra [name]  MRN: [id number] DOB: 06-May-1941  Reason for Encounter: Disease State call for hypertension and hyperlipidemia   Patient Questions:  1.  Have you seen any other providers since your last visit? No  2.  Any changes in your medicines or health? No    PCP : [name], MD  Allergies:   Allergies  Allergen Reactions  . Contrast Media [Iodinated Diagnostic Agents] Hives, Itching and Rash  . Shellfish Allergy Nausea And Vomiting  . Shellfish-Derived Products Nausea And Vomiting    Medications: Outpatient Encounter Medications as of 07/04/2020  Medication Sig  . alendronate (FOSAMAX) 70 MG tablet Take 1 tablet (70 mg total) by mouth once a week. Take with a full glass of water on an empty stomach.  Marland Kitchen amLODipine (NORVASC) 10 MG tablet Take 10 mg by mouth every morning.  . cyclobenzaprine (FLEXERIL) 10 MG tablet Take 1 tablet (10 mg total) by mouth 3 (three) times daily as needed for muscle spasms.  Marland Kitchen esomeprazole (NEXIUM) 40 MG capsule Take 40 mg by mouth daily as needed.   . formoterol (PERFOROMIST) 20 MCG/2ML nebulizer solution USE 1 VIAL  IN  NEBULIZER TWICE  DAILY - -Morning and Evening  . revefenacin (YUPELRI) 175 MCG/3ML nebulizer solution USE 1 VIAL IN NEBULIZER DAILY  . rOPINIRole (REQUIP) 2 MG tablet Take 1 tablet (2 mg total) by mouth in the morning and at bedtime.  . simvastatin (ZOCOR) 40 MG tablet TAKE 1 TABLET BY MOUTH EVERY DAY   No facility-administered encounter medications on file as of 07/04/2020.    Current Diagnosis: Patient Active Problem List   Diagnosis Date Noted  . Contusion of ala nasi 03/02/2020  . Contusion of both eyes 03/02/2020  . Chest wall pain 03/02/2020  . 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 Chronic kidney disease, stage 3a ([place]) 08/03/2019  . 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  . Bladder  cancer ([place]) 08/05/2018  . H/O left nephrectomy 08/05/2018  . COPD, moderate ([place]) 03/20/2018  . DOE (dyspnea on exertion) 01/08/2018  . Ex-smoker 01/08/2018  . Hyperparathyroidism, primary ([place]) 06/25/2016  . Essential hypertension 02/27/2016  . Hyperlipemia 02/27/2016  . History of kidney cancer 02/27/2016  . Age related osteoporosis 02/27/2016  . Spinal stenosis 02/27/2016   Reviewed chart prior to disease state call. Spoke with patient regarding BP  Recent Office Vitals: BP Readings from Last 3 Encounters:  03/28/20 128/68  03/02/20 (!) 150/80  02/17/20 (!) 150/80   Pulse Readings from Last 3 Encounters:  03/28/20 82  02/17/20 75  02/09/20 96    Wt Readings from Last 3 Encounters:  03/28/20 167 lb 6.4 oz (75.9 kg)  03/02/20 159 lb (72.1 kg)  02/17/20 165 lb (74.8 kg)     Kidney Function Lab Results  Component Value Date/Time   CREATININE 1.91 (H) 03/28/2020 07:53 AM   CREATININE 1.68 (H) 11/24/2019 12:00 AM   GFRNONAA 25 (L) 03/28/2020 07:53 AM   GFRAA 29 (L) 03/28/2020 07:53 AM    BMP Latest Ref Rng & Units 03/28/2020 11/24/2019 08/04/2019  Glucose 65 - 99 mg/dL 103(H) 102(H) 108(H)  BUN 8 - 27 mg/dL 26 27 32(H)  Creatinine 0.57 - 1.00 mg/dL 1.91(H) 1.68(H) 1.90(H)  BUN/Creat Ratio 12 - 28 14 16 17   Sodium 134 - 144 mmol/L 141 137 140  Potassium 3.5 - 5.2 mmol/L 4.9 4.7 4.8  Chloride 96 -  106 mmol/L 105 103 106  CO2 20 - 29 mmol/L 21 19(L) 16(L)  Calcium 8.7 - 10.3 mg/dL 9.4 9.2 9.1    . Current antihypertensive regimen:  Amlodipine  10mg   . How often are you checking your Blood Pressure? 1-2x per week   . Current home BP readings: Patient states they run about 123/70  . What recent interventions/DTPs have been made by any provider to improve Blood Pressure control since last CPP Visit: Patient takes her medication as directed, and has no DTP noted.  . Any recent hospitalizations or ED visits since last visit with CPP? No   . What diet changes have  been made to improve Blood Pressure Control? Patient states she doesn't follow any special diet.   . What exercise is being done to improve your Blood Pressure Control? Patient states her activity level is 5/10.    07/04/2020 Name: Alexandra [name] MRN: [id number] DOB: [birth year] Alexandra [name] is a 80 y.o. year old female who is a primary care patient of [name], MD.  Comprehensive medication review performed; Spoke to patient regarding cholesterol  Lipid Panel    Component Value Date/Time   CHOL 149 03/28/2020 0753   TRIG 122 03/28/2020 0753   HDL 50 03/28/2020 0753   LDLCALC 77 03/28/2020 0753    10-year ASCVD risk score: The 10-year ASCVD risk score Mikey Bussing DC Brooke Bonito., et al., 2013) is: 25.4%   Values used to calculate the score:     Age: 80 years     Sex: Female     Is Non-Hispanic African American: No     Diabetic: No     Tobacco smoker: No     Systolic Blood Pressure: 384 mmHg     Is BP treated: Yes     HDL Cholesterol: 50 mg/dL     Total Cholesterol: 149 mg/dL  . Current antihyperlipidemic regimen:  Simvastatin   . Previous antihyperlipidemic medications tried: None  . ASCVD risk enhancing conditions: age >16 and HTN   . What recent interventions/DTPs have been made by any provider to improve Cholesterol control since last CPP Visit:  Patient states she takes her medication as directed. No DTPs noted.  . Any recent hospitalizations or ED visits since last visit with CPP? No   . What diet changes have been made to improve Cholesterol? Patient stated she has no diet changes and doesn't follow any special diet.  . What exercise is being done to improve Cholesterol?  Patient stated her activity level is 5/10   Adherence Review: Does the patient have >5 day gap between last estimated fill dates? No   Adherence Review: Is the patient currently on ACE/ARB medication? No Does the patient have >5 day gap between last estimated fill dates? No   Follow-Up:   Pharmacist Review  [name], CPP notified  [name], [place] Pharmacist Assistant [phone]

## 2020-07-19 ENCOUNTER — Other Ambulatory Visit: Payer: Self-pay | Admitting: Legal Medicine

## 2020-07-21 ENCOUNTER — Ambulatory Visit (INDEPENDENT_AMBULATORY_CARE_PROVIDER_SITE_OTHER): Payer: Medicare Other | Admitting: Legal Medicine

## 2020-07-21 ENCOUNTER — Other Ambulatory Visit: Payer: Self-pay

## 2020-07-21 ENCOUNTER — Encounter: Payer: Self-pay | Admitting: Legal Medicine

## 2020-07-21 VITALS — BP 130/80 | HR 94 | Temp 98.0°F | Resp 16 | Ht 64.0 in | Wt 168.0 lb

## 2020-07-21 DIAGNOSIS — J449 Chronic obstructive pulmonary disease, unspecified: Secondary | ICD-10-CM | POA: Diagnosis not present

## 2020-07-21 DIAGNOSIS — M25531 Pain in right wrist: Secondary | ICD-10-CM | POA: Diagnosis not present

## 2020-07-21 DIAGNOSIS — M19031 Primary osteoarthritis, right wrist: Secondary | ICD-10-CM | POA: Diagnosis not present

## 2020-07-21 DIAGNOSIS — C679 Malignant neoplasm of bladder, unspecified: Secondary | ICD-10-CM

## 2020-07-21 DIAGNOSIS — S63111A Subluxation of metacarpophalangeal joint of right thumb, initial encounter: Secondary | ICD-10-CM | POA: Diagnosis not present

## 2020-07-21 DIAGNOSIS — M19041 Primary osteoarthritis, right hand: Secondary | ICD-10-CM | POA: Diagnosis not present

## 2020-07-21 MED ORDER — BREZTRI AEROSPHERE 160-9-4.8 MCG/ACT IN AERO: 2.0000 | INHALATION_SPRAY | Freq: Two times a day (BID) | RESPIRATORY_TRACT | 6 refills | Status: DC

## 2020-07-21 NOTE — Progress Notes (Signed)
Subjective:  Patient ID: Alexandra [name], female    DOB: [birth year]  Age: 80 y.o. MRN: [id number]  Chief Complaint  Patient presents with  . Wrist Pain    HPI: wrist osteoarthritis.  She has progressive pain in right mtp joint..  She has trouble gripping and drops things.  No injury. She has been using brace that helps   Current Outpatient Medications on File Prior to Visit  Medication Sig Dispense Refill  . alendronate (FOSAMAX) 70 MG tablet Take 1 tablet (70 mg total) by mouth once a week. Take with a full glass of water on an empty stomach. 12 tablet 3  . amLODipine (NORVASC) 10 MG tablet Take 10 mg by mouth every morning.    . cyclobenzaprine (FLEXERIL) 10 MG tablet Take 1 tablet (10 mg total) by mouth 3 (three) times daily as needed for muscle spasms. 30 tablet 0  . diclofenac Sodium (VOLTAREN) 1 % GEL SMARTSIG:4 Gram(s) Topical 1 to 4 Times Daily    . esomeprazole (NEXIUM) 40 MG capsule Take 40 mg by mouth daily as needed.     Marland Kitchen rOPINIRole (REQUIP) 2 MG tablet Take 1 tablet (2 mg total) by mouth in the morning and at bedtime. 180 tablet 6  . simvastatin (ZOCOR) 40 MG tablet TAKE 1 TABLET BY MOUTH EVERY DAY 90 tablet 2   No current facility-administered medications on file prior to visit.   Past Medical History:  Diagnosis Date  . Restless legs syndrome    Past Surgical History:  Procedure Laterality Date  . ABDOMINAL HYSTERECTOMY     complete  . BACK SURGERY  2016   lower L 4 to L 5  . bladder tack and uterus lift  10/10/2015  . IR RADIOLOGIST EVAL & MGMT  12/03/2018  . IR RADIOLOGIST EVAL & MGMT  12/31/2018  . left nephrectomy  1999  . PARATHYROIDECTOMY Left 07/02/2016   Procedure: LEFT INFERIOR PARATHYROIDECTOMY;  Surgeon: Armandina Gemma, MD;  Location: [facility];  Service: General;  Laterality: Left;  . SHOULDER OPEN ROTATOR CUFF REPAIR  12/2019    Family History  Problem Relation Age of Onset  . Emphysema Mother    Social History   Socioeconomic History  . Marital  status: Married    Spouse name: Not on file  . Number of children: Not on file  . Years of education: Not on file  . Highest education level: Not on file  Occupational History  . Not on file  Tobacco Use  . Smoking status: Former Smoker    Packs/day: 1.00    Years: 50.00    Pack years: 50.00    Types: Cigarettes    Quit date: 06/11/2005    Years since quitting: 15.1  . Smokeless tobacco: Never Used  Vaping Use  . Vaping Use: Never used  Substance and Sexual Activity  . Alcohol use: No  . Drug use: No  . Sexual activity: Yes    Partners: Male  Other Topics Concern  . Not on file  Social History Narrative  . Not on file   Social Determinants of Health   Financial Resource Strain: Not on file  Food Insecurity: No Food Insecurity  . Worried About Charity fundraiser in the Last Year: Never true  . Ran Out of Food in the Last Year: Never true  Transportation Needs: No Transportation Needs  . Lack of Transportation (Medical): No  . Lack of Transportation (Non-Medical): No  Physical Activity: Not on file  Stress: Not on file  Social Connections: Not on file    Review of Systems  Constitutional: Negative for activity change, appetite change and unexpected weight change.  HENT: Negative for congestion and sinus pain.   Eyes: Negative for visual disturbance.  Respiratory: Negative for chest tightness and shortness of breath.   Cardiovascular: Negative for chest pain, palpitations and leg swelling.  Gastrointestinal: Negative for abdominal distention and abdominal pain.  Endocrine: Negative for polyuria.  Genitourinary: Negative for difficulty urinating, dyspareunia and urgency.  Musculoskeletal: Positive for arthralgias (right first carpal- metacarpal joint). Negative for back pain.  Skin: Negative.   Neurological: Negative.   Psychiatric/Behavioral: Negative.      Objective:  BP 130/80   Pulse 94   Temp 98 F (36.7 C)   Resp 16   Ht 5\' 4"  (1.626 m)   Wt 168 lb  (76.2 kg)   SpO2 97%   BMI 28.84 kg/m   BP/Weight 07/21/2020 03/28/2020 9/83/3825  Systolic BP 053 976 734  Diastolic BP 80 68 80  Wt. (Lbs) 168 167.4 159  BMI 28.84 29.65 28.17    Physical Exam Vitals reviewed.  Constitutional:      Appearance: Normal appearance.  HENT:     Right Ear: Tympanic membrane normal.     Left Ear: Tympanic membrane normal.     Mouth/Throat:     Mouth: Mucous membranes are moist.     Pharynx: Oropharynx is clear.  Eyes:     Extraocular Movements: Extraocular movements intact.     Conjunctiva/sclera: Conjunctivae normal.     Pupils: Pupils are equal, round, and reactive to light.  Cardiovascular:     Rate and Rhythm: Normal rate and regular rhythm.     Pulses: Normal pulses.     Heart sounds: Normal heart sounds. No murmur heard. No gallop.   Pulmonary:     Effort: Pulmonary effort is normal. No respiratory distress.     Breath sounds: Normal breath sounds. No rales.  Abdominal:     General: Abdomen is flat. Bowel sounds are normal. There is no distension.     Palpations: Abdomen is soft.     Tenderness: There is no abdominal tenderness.  Musculoskeletal:     Cervical back: Normal range of motion and neck supple.     Comments: Arthritis 1st carpl-metcarpal joint.  Better with brace  Skin:    General: Skin is warm and dry.     Capillary Refill: Capillary refill takes less than 2 seconds.  Neurological:     General: No focal deficit present.     Mental Status: She is alert and oriented to person, place, and time.       Lab Results  Component Value Date   WBC 10.9 (H) 03/28/2020   HGB 11.8 03/28/2020   HCT 36.3 03/28/2020   PLT 432 03/28/2020   GLUCOSE 103 (H) 03/28/2020   CHOL 149 03/28/2020   TRIG 122 03/28/2020   HDL 50 03/28/2020   LDLCALC 77 03/28/2020   ALT 14 03/28/2020   AST 13 03/28/2020   NA 141 03/28/2020   K 4.9 03/28/2020   CL 105 03/28/2020   CREATININE 1.91 (H) 03/28/2020   BUN 26 03/28/2020   CO2 21 03/28/2020       Assessment & Plan:   Diagnoses and all orders for this visit: Primary osteoarthritis of right hand -     DG Hand Complete Right Patient is having OA right 1st Carpal-metacarpal joint with weak grip.  Get  x-ray and use thumb spica brace.  Order sent in. COPD, moderate ([place]) -     Budeson-Glycopyrrol-Formoterol (BREZTRI AEROSPHERE) 160-9-4.8 MCG/ACT AERO; Inhale 2 puffs into the lungs in the morning and at bedtime. An individualize plan was formulated for care of COPD.  Treatment is evidence based.  She will continue on inhalers, avoid smoking and smoke.  Regular exercise with help with dyspnea. Routine follow ups and medication compliance is needed. Malignant neoplasm of urinary bladder, unspecified site [facility]) Patient continues to see urology for treatment of bladder cancer.         Follow-up: Return next appontment.  An After Visit Summary was printed and given to the patient.  Reinaldo Meeker, MD [facility] [phone]

## 2020-07-22 ENCOUNTER — Ambulatory Visit (INDEPENDENT_AMBULATORY_CARE_PROVIDER_SITE_OTHER): Payer: Medicare Other

## 2020-07-22 DIAGNOSIS — J449 Chronic obstructive pulmonary disease, unspecified: Secondary | ICD-10-CM | POA: Diagnosis not present

## 2020-07-27 NOTE — Chronic Care Management (AMB) (Signed)
Patient called and stated the Breztri inhaler was too expensive at pharmacy. Pharmacist coordinated patient assistance application and submitted to [place] and [place]. Patient provided with sample in office.   Sherre Poot, PharmD, [facility] Clinical Pharmacist [facility] [phone] (office) [phone] (mobile)

## 2020-08-03 ENCOUNTER — Telehealth: Payer: Self-pay

## 2020-08-03 NOTE — Progress Notes (Signed)
Chronic Care Management Pharmacy Assistant   Name: BLIMIE VANESS  MRN: [id number] DOB: July 08, 1940  Reason for Encounter: Medication Review for Breztri refill status    PCP : [name], MD  Allergies:   Allergies  Allergen Reactions  . Contrast Media [Iodinated Diagnostic Agents] Hives, Itching and Rash  . Shellfish Allergy Nausea And Vomiting  . Shellfish-Derived Products Nausea And Vomiting    Medications: Outpatient Encounter Medications as of 08/03/2020  Medication Sig  . alendronate (FOSAMAX) 70 MG tablet Take 1 tablet (70 mg total) by mouth once a week. Take with a full glass of water on an empty stomach.  Marland Kitchen amLODipine (NORVASC) 10 MG tablet Take 10 mg by mouth every morning.  . Budeson-Glycopyrrol-Formoterol (BREZTRI AEROSPHERE) 160-9-4.8 MCG/ACT AERO Inhale 2 puffs into the lungs in the morning and at bedtime.  . cyclobenzaprine (FLEXERIL) 10 MG tablet Take 1 tablet (10 mg total) by mouth 3 (three) times daily as needed for muscle spasms.  . diclofenac Sodium (VOLTAREN) 1 % GEL SMARTSIG:4 Gram(s) Topical 1 to 4 Times Daily  . esomeprazole (NEXIUM) 40 MG capsule Take 40 mg by mouth daily as needed.   Marland Kitchen rOPINIRole (REQUIP) 2 MG tablet Take 1 tablet (2 mg total) by mouth in the morning and at bedtime.  . simvastatin (ZOCOR) 40 MG tablet TAKE 1 TABLET BY MOUTH EVERY DAY   No facility-administered encounter medications on file as of 08/03/2020.    Current Diagnosis: Patient Active Problem List   Diagnosis Date Noted  . Contusion of ala nasi 03/02/2020  . Contusion of both eyes 03/02/2020  . Chest wall pain 03/02/2020  . 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 Chronic kidney disease, stage 3a ([place]) 08/03/2019  . 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  . Bladder cancer ([place]) 08/05/2018  . H/O left nephrectomy 08/05/2018  . COPD, moderate ([place]) 03/20/2018  . DOE (dyspnea on exertion)  01/08/2018  . Ex-smoker 01/08/2018  . Hyperparathyroidism, primary ([place]) 06/25/2016  . Essential hypertension 02/27/2016  . Hyperlipemia 02/27/2016  . History of kidney cancer 02/27/2016  . Age related osteoporosis 02/27/2016  . Spinal stenosis 02/27/2016   Patient called and left a message requesting a call back.  I called the patient and she was wondering to expect her Breztri from PAP.  I told her I would call the company and check on the status.  I called Bre[employer], representative stated her application needed to be completed, she was going to put me on a brief hold and complete her application.  Rep stated her application was on hold as to they needed her [employer] part D coverage information.  I contacted [name], CPP and she was able to provide that for me.   I got disconnected, called back and the new rep gathered the BIN number for her insurance, and stated the application was in process.  She said I could check back later today or tomorrow for update.  I called after lunch and the rep stated that they needed proof of her income, [name], CPP, stated she had the updated amount and would send it to the company.   [name], CPP, stated the patient could come and get a sample at the office if she needed one.  I called the patient back to let her know the status, and that she can get a sample if needed.   Follow-Up:  Patient Assistance Coordination and Pharmacist Review  [name],  CPP notified  [name], [place] Pharmacist Assistant [phone]

## 2020-08-08 ENCOUNTER — Telehealth: Payer: Self-pay

## 2020-08-08 NOTE — Progress Notes (Signed)
    Chronic Care Management Pharmacy Assistant   Name: Alexandra [name]  MRN: [id number] DOB: 09/30/1940  Reason for Encounter: Adherence   PCP : [name], MD  Allergies:   Allergies  Allergen Reactions  . Contrast Media [Iodinated Diagnostic Agents] Hives, Itching and Rash  . Shellfish Allergy Nausea And Vomiting  . Shellfish-Derived Products Nausea And Vomiting    Medications: Outpatient Encounter Medications as of 08/08/2020  Medication Sig  . alendronate (FOSAMAX) 70 MG tablet Take 1 tablet (70 mg total) by mouth once a week. Take with a full glass of water on an empty stomach.  Marland Kitchen amLODipine (NORVASC) 10 MG tablet Take 10 mg by mouth every morning.  . Budeson-Glycopyrrol-Formoterol (BREZTRI AEROSPHERE) 160-9-4.8 MCG/ACT AERO Inhale 2 puffs into the lungs in the morning and at bedtime.  . cyclobenzaprine (FLEXERIL) 10 MG tablet Take 1 tablet (10 mg total) by mouth 3 (three) times daily as needed for muscle spasms.  . diclofenac Sodium (VOLTAREN) 1 % GEL SMARTSIG:4 Gram(s) Topical 1 to 4 Times Daily  . esomeprazole (NEXIUM) 40 MG capsule Take 40 mg by mouth daily as needed.   Marland Kitchen rOPINIRole (REQUIP) 2 MG tablet Take 1 tablet (2 mg total) by mouth in the morning and at bedtime.  . simvastatin (ZOCOR) 40 MG tablet TAKE 1 TABLET BY MOUTH EVERY DAY   No facility-administered encounter medications on file as of 08/08/2020.    Current Diagnosis: Patient Active Problem List   Diagnosis Date Noted  . Contusion of ala nasi 03/02/2020  . Contusion of both eyes 03/02/2020  . Chest wall pain 03/02/2020  . 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 Chronic kidney disease, stage 3a ([place]) 08/03/2019  . 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  . Bladder cancer ([place]) 08/05/2018  . H/O left nephrectomy 08/05/2018  . COPD, moderate ([place]) 03/20/2018  . DOE (dyspnea on exertion) 01/08/2018  . Ex-smoker 01/08/2018  .  Hyperparathyroidism, primary ([place]) 06/25/2016  . Essential hypertension 02/27/2016  . Hyperlipemia 02/27/2016  . History of kidney cancer 02/27/2016  . Age related osteoporosis 02/27/2016  . Spinal stenosis 02/27/2016    Chart review noted the patient has been approved for [facility].  Patient last saw Dr. Henrene Pastor, PCP on 07/21/20.  Patient next appointment with PCP is on 09/26/20  Adherence gap Total Gaps - All Measures 2  Total Gaps - Star Measures 0 Diabetes: Hemoglobin A1c Control (<= 9.0%) 0 Diabetes: Hemoglobin A1c Testing 0 Controlling High Blood Pressure Met: BP < 140/90 Preventive Care and Screening: Tobacco Use: Screening and Cessation Intervention - With Non-Smokers Met: Non-Tobacco User Preventive Care and Screening: Tobacco Use: Screening and Cessation Intervention 0 Preventive Care and Screening: Screening for Clinical Depression and Follow-up Plan Met: Negative Depression Screening Breast Cancer Screening 0 Colorectal Cancer Screening 0 Visits: Wellness Bundle [facility]) Not Met: AWV not performed Visits: Annual Wellness Visit [facility]) Not Met: AWV not performed  Follow-Up:  Pharmacist Review  [name], [place], [place] Pharmacist Assistant [phone]

## 2020-08-09 ENCOUNTER — Other Ambulatory Visit: Payer: Self-pay | Admitting: Legal Medicine

## 2020-08-09 DIAGNOSIS — I10 Essential (primary) hypertension: Secondary | ICD-10-CM

## 2020-08-09 MED ORDER — AMLODIPINE BESYLATE 10 MG PO TABS: 10.0000 mg | ORAL_TABLET | ORAL | 2 refills | Status: DC

## 2020-08-22 ENCOUNTER — Telehealth: Payer: Self-pay

## 2020-08-22 ENCOUNTER — Other Ambulatory Visit: Payer: Self-pay

## 2020-08-22 DIAGNOSIS — J449 Chronic obstructive pulmonary disease, unspecified: Secondary | ICD-10-CM

## 2020-08-22 DIAGNOSIS — M12811 Other specific arthropathies, not elsewhere classified, right shoulder: Secondary | ICD-10-CM | POA: Diagnosis not present

## 2020-08-22 MED ORDER — BREZTRI AEROSPHERE 160-9-4.8 MCG/ACT IN AERO: 2.0000 | INHALATION_SPRAY | Freq: Two times a day (BID) | RESPIRATORY_TRACT | 6 refills | Status: DC

## 2020-08-22 NOTE — Progress Notes (Signed)
    Chronic Care Management Pharmacy Assistant   Name: Alexandra [name]  MRN: [id number] DOB: 1940/12/14  .  Reason for Encounter: Medication Review for Breztri PAP status    Medications: Outpatient Encounter Medications as of 08/22/2020  Medication Sig  . alendronate (FOSAMAX) 70 MG tablet Take 1 tablet (70 mg total) by mouth once a week. Take with a full glass of water on an empty stomach.  Marland Kitchen amLODipine (NORVASC) 10 MG tablet Take 1 tablet (10 mg total) by mouth every morning.  . Budeson-Glycopyrrol-Formoterol (BREZTRI AEROSPHERE) 160-9-4.8 MCG/ACT AERO Inhale 2 puffs into the lungs in the morning and at bedtime.  . cyclobenzaprine (FLEXERIL) 10 MG tablet Take 1 tablet (10 mg total) by mouth 3 (three) times daily as needed for muscle spasms.  . diclofenac Sodium (VOLTAREN) 1 % GEL SMARTSIG:4 Gram(s) Topical 1 to 4 Times Daily  . esomeprazole (NEXIUM) 40 MG capsule Take 40 mg by mouth daily as needed.   Marland Kitchen rOPINIRole (REQUIP) 2 MG tablet Take 1 tablet (2 mg total) by mouth in the morning and at bedtime.  . simvastatin (ZOCOR) 40 MG tablet TAKE 1 TABLET BY MOUTH EVERY DAY   No facility-administered encounter medications on file as of 08/22/2020.   Spoke to representative, they had received the updated income information and she was able to finish processing it while I was on the phone with her.  The patient is approved and should expect her medication in the next 7-12 business days.   I have messaged [name], CPP as to if we have samples due to the patient beng out of her Breztri.  I have notified the patient as to the results.  [name], [place] Pharmacist Assistant [phone]

## 2020-08-29 DIAGNOSIS — N2889 Other specified disorders of kidney and ureter: Secondary | ICD-10-CM | POA: Diagnosis not present

## 2020-08-29 DIAGNOSIS — C672 Malignant neoplasm of lateral wall of bladder: Secondary | ICD-10-CM | POA: Diagnosis not present

## 2020-08-31 ENCOUNTER — Telehealth: Payer: Self-pay

## 2020-08-31 NOTE — Progress Notes (Signed)
    Chronic Care Management Pharmacy Assistant   Name: Alexandra [name]  MRN: [id number] DOB: Dec 27, 1940   Reason for Encounter: Adherence review    Medications: Outpatient Encounter Medications as of 08/31/2020  Medication Sig  . alendronate (FOSAMAX) 70 MG tablet Take 1 tablet (70 mg total) by mouth once a week. Take with a full glass of water on an empty stomach.  Marland Kitchen amLODipine (NORVASC) 10 MG tablet Take 1 tablet (10 mg total) by mouth every morning.  . Budeson-Glycopyrrol-Formoterol (BREZTRI AEROSPHERE) 160-9-4.8 MCG/ACT AERO Inhale 2 puffs into the lungs in the morning and at bedtime.  . cyclobenzaprine (FLEXERIL) 10 MG tablet Take 1 tablet (10 mg total) by mouth 3 (three) times daily as needed for muscle spasms.  . diclofenac Sodium (VOLTAREN) 1 % GEL SMARTSIG:4 Gram(s) Topical 1 to 4 Times Daily  . esomeprazole (NEXIUM) 40 MG capsule Take 40 mg by mouth daily as needed.   Marland Kitchen rOPINIRole (REQUIP) 2 MG tablet Take 1 tablet (2 mg total) by mouth in the morning and at bedtime.  . simvastatin (ZOCOR) 40 MG tablet TAKE 1 TABLET BY MOUTH EVERY DAY   No facility-administered encounter medications on file as of 08/31/2020.    Chart review showed the patients next appointments are: Dr. Henrene Pastor  4/1/2// [name], CPP   10/11/20  Adherence gaps Total Gaps - All Measures 2  Total Gaps - Star Measures 0 Diabetes: Hemoglobin A1c Control (<= 9.0%) 0 Diabetes: Hemoglobin A1c Testing 0 Controlling High Blood Pressure Met: BP < 140/90 Preventive Care and Screening: Tobacco Use: Screening and Cessation Intervention - With Non-Smokers Met: Non-Tobacco User Preventive Care and Screening: Tobacco Use: Screening and Cessation Intervention 0 Preventive Care and Screening: Screening for Clinical Depression and Follow-up Plan Met: Negative Depression Screening Breast Cancer Screening 0 Colorectal Cancer Screening 0 Visits: Wellness Bundle [facility]) Not Met: AWV not performed Visits: Annual Wellness Visit  [facility]) Not Met: AWV not performed  Alexandra [name], [place] Pharmacist Assistant [phone]

## 2020-09-12 DIAGNOSIS — H26491 Other secondary cataract, right eye: Secondary | ICD-10-CM | POA: Diagnosis not present

## 2020-09-26 ENCOUNTER — Encounter: Payer: Self-pay | Admitting: Legal Medicine

## 2020-09-26 ENCOUNTER — Ambulatory Visit (INDEPENDENT_AMBULATORY_CARE_PROVIDER_SITE_OTHER): Payer: Medicare Other | Admitting: Legal Medicine

## 2020-09-26 ENCOUNTER — Other Ambulatory Visit: Payer: Self-pay

## 2020-09-26 VITALS — BP 128/74 | HR 68 | Temp 95.9°F | Ht 64.0 in | Wt 168.0 lb

## 2020-09-26 DIAGNOSIS — N1831 Chronic kidney disease, stage 3a: Secondary | ICD-10-CM

## 2020-09-26 DIAGNOSIS — K2100 Gastro-esophageal reflux disease with esophagitis, without bleeding: Secondary | ICD-10-CM

## 2020-09-26 DIAGNOSIS — H906 Mixed conductive and sensorineural hearing loss, bilateral: Secondary | ICD-10-CM

## 2020-09-26 DIAGNOSIS — T7840XA Allergy, unspecified, initial encounter: Secondary | ICD-10-CM

## 2020-09-26 DIAGNOSIS — I10 Essential (primary) hypertension: Secondary | ICD-10-CM | POA: Diagnosis not present

## 2020-09-26 DIAGNOSIS — E782 Mixed hyperlipidemia: Secondary | ICD-10-CM

## 2020-09-26 DIAGNOSIS — C679 Malignant neoplasm of bladder, unspecified: Secondary | ICD-10-CM

## 2020-09-26 DIAGNOSIS — E210 Primary hyperparathyroidism: Secondary | ICD-10-CM

## 2020-09-26 DIAGNOSIS — M810 Age-related osteoporosis without current pathological fracture: Secondary | ICD-10-CM | POA: Diagnosis not present

## 2020-09-26 MED ORDER — TRIAMCINOLONE ACETONIDE 40 MG/ML IJ SUSP
60.0000 mg | Freq: Once | INTRAMUSCULAR | Status: AC
  Administered 2020-09-26: 60 mg via INTRAMUSCULAR

## 2020-09-26 MED ORDER — TRIAMCINOLONE ACETONIDE 10 MG/ML IJ SUSP
60.0000 mg | Freq: Once | INTRAMUSCULAR | Status: DC
Start: 2020-09-26 — End: 2020-09-26

## 2020-09-26 NOTE — Progress Notes (Signed)
Subjective:  Patient ID: Alexandra [name], female    DOB: [birth year]  Age: 80 y.o. MRN: [id number]  Chief Complaint  Patient presents with  . Hypertension  . Hyperlipidemia  . Gastroesophageal Reflux    HPI: chronic visit  Patient presents for follow up of hypertension.  Patient tolerating amlodipine well with side effects.  Patient was diagnosed with hypertension 2010 so has been treated for hypertension for 10 years.Patient is working on maintaining diet and exercise regimen and follows up as directed. Complication include none.  Patient presents with hyperlipidemia.  Compliance with treatment has been good; patient takes medicines as directed, maintains low cholesterol diet, follows up as directed, and maintains exercise regimen.  Patient is using simvastatin without problems.  Patient has gastroesophageal reflux symptoms withesophagitis and LTRD.  The symptoms are moderate intensity.  Length of symptoms 10 years.  Medicines include oeprazole.  Complications include none.   She is having some sinus problems. Current Outpatient Medications on File Prior to Visit  Medication Sig Dispense Refill  . alendronate (FOSAMAX) 70 MG tablet Take 1 tablet (70 mg total) by mouth once a week. Take with a full glass of water on an empty stomach. 12 tablet 3  . amLODipine (NORVASC) 10 MG tablet Take 1 tablet (10 mg total) by mouth every morning. 90 tablet 2  . Budeson-Glycopyrrol-Formoterol (BREZTRI AEROSPHERE) 160-9-4.8 MCG/ACT AERO Inhale 2 puffs into the lungs in the morning and at bedtime. 10.7 g 6  . cyclobenzaprine (FLEXERIL) 10 MG tablet Take 1 tablet (10 mg total) by mouth 3 (three) times daily as needed for muscle spasms. 30 tablet 0  . diclofenac Sodium (VOLTAREN) 1 % GEL SMARTSIG:4 Gram(s) Topical 1 to 4 Times Daily    . esomeprazole (NEXIUM) 40 MG capsule Take 40 mg by mouth daily as needed.     Marland Kitchen rOPINIRole (REQUIP) 2 MG tablet Take 1 tablet (2 mg total) by mouth in the morning and at  bedtime. 180 tablet 6  . simvastatin (ZOCOR) 40 MG tablet TAKE 1 TABLET BY MOUTH EVERY DAY 90 tablet 2   No current facility-administered medications on file prior to visit.   Past Medical History:  Diagnosis Date  . Restless legs syndrome    Past Surgical History:  Procedure Laterality Date  . ABDOMINAL HYSTERECTOMY     complete  . BACK SURGERY  2016   lower L 4 to L 5  . bladder tack and uterus lift  10/10/2015  . IR RADIOLOGIST EVAL & MGMT  12/03/2018  . IR RADIOLOGIST EVAL & MGMT  12/31/2018  . left nephrectomy  1999  . PARATHYROIDECTOMY Left 07/02/2016   Procedure: LEFT INFERIOR PARATHYROIDECTOMY;  Surgeon: Armandina Gemma, MD;  Location: [facility];  Service: General;  Laterality: Left;  . SHOULDER OPEN ROTATOR CUFF REPAIR  12/2019    Family History  Problem Relation Age of Onset  . Emphysema Mother    Social History   Socioeconomic History  . Marital status: Married    Spouse name: Not on file  . Number of children: Not on file  . Years of education: Not on file  . Highest education level: Not on file  Occupational History  . Not on file  Tobacco Use  . Smoking status: Former Smoker    Packs/day: 1.00    Years: 50.00    Pack years: 50.00    Types: Cigarettes    Quit date: 06/11/2005    Years since quitting: 15.3  . Smokeless tobacco: Never Used  Vaping Use  . Vaping Use: Never used  Substance and Sexual Activity  . Alcohol use: No  . Drug use: No  . Sexual activity: Yes    Partners: Male  Other Topics Concern  . Not on file  Social History Narrative  . Not on file   Social Determinants of Health   Financial Resource Strain: Not on file  Food Insecurity: Not on file  Transportation Needs: No Transportation Needs  . Lack of Transportation (Medical): No  . Lack of Transportation (Non-Medical): No  Physical Activity: Not on file  Stress: Not on file  Social Connections: Not on file    Review of Systems  Constitutional: Negative for chills, fatigue and  fever.  HENT: Positive for congestion and ear pain. Negative for rhinorrhea and sore throat.   Respiratory: Negative for cough and shortness of breath.   Cardiovascular: Negative for chest pain.  Gastrointestinal: Negative for abdominal pain, constipation, diarrhea, nausea and vomiting.  Genitourinary: Negative for dysuria and urgency.  Musculoskeletal: Negative for back pain and myalgias.  Neurological: Negative for dizziness, weakness, light-headedness and headaches.  Psychiatric/Behavioral: Negative for dysphoric mood. The patient is not nervous/anxious.      Objective:  BP 128/74   Pulse 68   Temp (!) 95.9 F (35.5 C)   Ht 5\' 4"  (1.626 m)   Wt 168 lb (76.2 kg)   SpO2 98%   BMI 28.84 kg/m   BP/Weight 09/26/2020 07/21/2020 70/26/3785  Systolic BP 885 027 741  Diastolic BP 74 80 68  Wt. (Lbs) 168 168 167.4  BMI 28.84 28.84 29.65    Physical Exam Vitals reviewed.  Constitutional:      Appearance: Normal appearance.  HENT:     Head: Normocephalic.     Right Ear: Tympanic membrane, ear canal and external ear normal.     Left Ear: Tympanic membrane, ear canal and external ear normal.     Mouth/Throat:     Mouth: Mucous membranes are moist.     Pharynx: Oropharynx is clear.  Eyes:     Extraocular Movements: Extraocular movements intact.     Conjunctiva/sclera: Conjunctivae normal.     Pupils: Pupils are equal, round, and reactive to light.  Cardiovascular:     Rate and Rhythm: Normal rate and regular rhythm.     Pulses: Normal pulses.     Heart sounds: Normal heart sounds. No murmur heard. No gallop.   Pulmonary:     Effort: Pulmonary effort is normal. No respiratory distress.     Breath sounds: Normal breath sounds. No rales.  Abdominal:     General: Abdomen is flat. Bowel sounds are normal. There is no distension.     Palpations: Abdomen is soft.     Tenderness: There is no abdominal tenderness.  Musculoskeletal:        General: Normal range of motion.      Cervical back: Normal range of motion and neck supple.     Right lower leg: Edema present.     Left lower leg: Edema present.  Skin:    General: Skin is warm and dry.     Capillary Refill: Capillary refill takes less than 2 seconds.  Neurological:     General: No focal deficit present.     Mental Status: She is alert and oriented to person, place, and time. Mental status is at baseline.  Psychiatric:        Mood and Affect: Mood normal.        Behavior:  Behavior normal.        Thought Content: Thought content normal.        Judgment: Judgment normal.       Lab Results  Component Value Date   WBC 10.9 (H) 03/28/2020   HGB 11.8 03/28/2020   HCT 36.3 03/28/2020   PLT 432 03/28/2020   GLUCOSE 103 (H) 03/28/2020   CHOL 149 03/28/2020   TRIG 122 03/28/2020   HDL 50 03/28/2020   LDLCALC 77 03/28/2020   ALT 14 03/28/2020   AST 13 03/28/2020   NA 141 03/28/2020   K 4.9 03/28/2020   CL 105 03/28/2020   CREATININE 1.91 (H) 03/28/2020   BUN 26 03/28/2020   CO2 21 03/28/2020      Assessment & Plan:   Diagnoses and all orders for this visit: Essential hypertension -     CBC with Differential/Platelet -     Comprehensive metabolic panel An individual hypertension care plan was established and reinforced today.  The patient's status was assessed using clinical findings on exam and labs or diagnostic tests. The patient's success at meeting treatment goals on disease specific evidence-based guidelines and found to be well controlled. SELF MANAGEMENT: The patient and I together assessed ways to personally work towards obtaining the recommended goals. RECOMMENDATIONS: avoid decongestants found in common cold remedies, decrease consumption of alcohol, perform routine monitoring of BP with home BP cuff, exercise, reduction of dietary salt, take medicines as prescribed, try not to miss doses and quit smoking.  Regular exercise and maintaining a healthy weight is needed.  Stress reduction  may help. A CLINICAL SUMMARY including written plan identify barriers to care unique to individual due to social or financial issues.  We attempt to mutually creat solutions for individual and family understanding.  Hyperparathyroidism, primary ([place]) Stable with controllable BP and kidney functions stable  Gastroesophageal reflux disease with esophagitis without hemorrhage Plan of care was formulated today.  She is doing well.  A plan of care was formulated using patient exam, tests and other sources to optimize care using evidence based information.  Recommend no smoking, no eating after supper, avoid fatty foods, elevate Head of bed, avoid tight fitting clothing.  Continue on nexium . Mixed conductive and sensorineural hearing loss, bilateral Patient has hearing aids  Age-related osteoporosis without current pathological fracture AN INDIVIDUAL CARE PLAN for osteoporosis was established and reinforced today.  The patient's status was assessed using clinical findings on exam, labs, and other diagnostic testing. Patient's success at meeting treatment goals based on disease specific evidence-bassed guidelines and found to be in fair control. RECOMMENDATIONS include maintaining present medicines and treatment.  Chronic kidney disease, stage 3a ([place]) AN INDIVIDUAL CARE PLAN for renal failure was established and reinforced today.  The patient's status was assessed using clinical findings on exam, labs, and other diagnostic testing. Patient's success at meeting treatment goals based on disease specific evidence-bassed guidelines and found to be in fair control. RECOMMENDATIONS include maintaining present medicines and treatment.  Malignant neoplasm of urinary bladder, unspecified site [facility]) Patient has bladder cancer with periodic cystoscopy for recurrent lesions  Mixed hyperlipidemia -     Lipid panel AN INDIVIDUAL CARE PLAN for hyperlipidemia/ cholesterol was established and reinforced today.   The patient's status was assessed using clinical findings on exam, lab and other diagnostic tests. The patient's disease status was assessed based on evidence-based guidelines and found to be well controlled. MEDICATIONS were reviewed. SELF MANAGEMENT GOALS have been discussed and patient's success at  attaining the goal of low cholesterol was assessed. RECOMMENDATION given include regular exercise 3 days a week and low cholesterol/low fat diet. CLINICAL SUMMARY including written plan to identify barriers unique to the patient due to social or economic  reasons was discussed.  Allergy, initial encounter Patient is having seasonal allergies -     triamcinolone acetonide (KENALOG-40) injection 60 mg        Follow-up: Return in about 6 months (around 03/28/2021) for fasting.  An After Visit Summary was printed and given to the patient.  Reinaldo Meeker, MD [facility] [phone]

## 2020-09-27 LAB — CBC WITH DIFFERENTIAL/PLATELET
Basophils Absolute: 0 10*3/uL (ref 0.0–0.2)
Basos: 0 %
EOS (ABSOLUTE): 0.1 10*3/uL (ref 0.0–0.4)
Eos: 1 %
Hematocrit: 35.7 % (ref 34.0–46.6)
Hemoglobin: 11.7 g/dL (ref 11.1–15.9)
Immature Grans (Abs): 0.1 10*3/uL (ref 0.0–0.1)
Immature Granulocytes: 1 %
Lymphocytes Absolute: 2.5 10*3/uL (ref 0.7–3.1)
Lymphs: 27 %
MCH: 30.1 pg (ref 26.6–33.0)
MCHC: 32.8 g/dL (ref 31.5–35.7)
MCV: 92 fL (ref 79–97)
Monocytes Absolute: 0.7 10*3/uL (ref 0.1–0.9)
Monocytes: 8 %
Neutrophils Absolute: 5.7 10*3/uL (ref 1.4–7.0)
Neutrophils: 63 %
Platelets: 377 10*3/uL (ref 150–450)
RBC: 3.89 x10E6/uL (ref 3.77–5.28)
RDW: 14 % (ref 11.7–15.4)
WBC: 9.2 10*3/uL (ref 3.4–10.8)

## 2020-09-27 LAB — COMPREHENSIVE METABOLIC PANEL
ALT: 19 IU/L (ref 0–32)
AST: 16 IU/L (ref 0–40)
Albumin/Globulin Ratio: 1.7 (ref 1.2–2.2)
Albumin: 4 g/dL (ref 3.7–4.7)
Alkaline Phosphatase: 94 IU/L (ref 44–121)
BUN/Creatinine Ratio: 14 (ref 12–28)
BUN: 26 mg/dL (ref 8–27)
Bilirubin Total: 0.2 mg/dL (ref 0.0–1.2)
CO2: 21 mmol/L (ref 20–29)
Calcium: 9.1 mg/dL (ref 8.7–10.3)
Chloride: 105 mmol/L (ref 96–106)
Creatinine, Ser: 1.81 mg/dL — ABNORMAL HIGH (ref 0.57–1.00)
Globulin, Total: 2.3 g/dL (ref 1.5–4.5)
Glucose: 93 mg/dL (ref 65–99)
Potassium: 5 mmol/L (ref 3.5–5.2)
Sodium: 143 mmol/L (ref 134–144)
Total Protein: 6.3 g/dL (ref 6.0–8.5)
eGFR: 28 mL/min/{1.73_m2} — ABNORMAL LOW (ref >59)

## 2020-09-27 LAB — LIPID PANEL
Chol/HDL Ratio: 2.7 ratio (ref 0.0–4.4)
Cholesterol, Total: 144 mg/dL (ref 100–199)
HDL: 54 mg/dL (ref >39)
LDL Chol Calc (NIH): 73 mg/dL (ref 0–99)
Triglycerides: 90 mg/dL (ref 0–149)
VLDL Cholesterol Cal: 17 mg/dL (ref 5–40)

## 2020-09-27 LAB — CARDIOVASCULAR RISK ASSESSMENT

## 2020-09-27 NOTE — Progress Notes (Signed)
CBC normal, kidney tests stage 4 eGFR 28, needs nephrology consult.  Normal liver tests, cholesterol normal,  lp

## 2020-09-28 ENCOUNTER — Other Ambulatory Visit: Payer: Self-pay

## 2020-09-28 DIAGNOSIS — N184 Chronic kidney disease, stage 4 (severe): Secondary | ICD-10-CM

## 2020-09-29 ENCOUNTER — Telehealth: Payer: Self-pay

## 2020-09-29 NOTE — Progress Notes (Signed)
Chronic Care Management Pharmacy Assistant   Name: Alexandra [name]  MRN: [id number] DOB: [birth year]  Alexandra [name] is an 80 y.o. year old female who presents for his follow-up CCM visit with the clinical pharmacist.  Reason for Encounter: Adherence call    Conditions to be addressed/monitored: HTN  Recent office visits:  09/26/2020: Alexandra Anes, MD (PCP) / No medication changes noted  Recent consult visits:  No consult visits since last visit with CPP.  Hospital visits:  No hospital visits since last visit with CPP.  Medications: Outpatient Encounter Medications as of 09/29/2020  Medication Sig  . alendronate (FOSAMAX) 70 MG tablet Take 1 tablet (70 mg total) by mouth once a week. Take with a full glass of water on an empty stomach.  Marland Kitchen amLODipine (NORVASC) 10 MG tablet Take 1 tablet (10 mg total) by mouth every morning.  . Budeson-Glycopyrrol-Formoterol (BREZTRI AEROSPHERE) 160-9-4.8 MCG/ACT AERO Inhale 2 puffs into the lungs in the morning and at bedtime.  . cyclobenzaprine (FLEXERIL) 10 MG tablet Take 1 tablet (10 mg total) by mouth 3 (three) times daily as needed for muscle spasms.  . diclofenac Sodium (VOLTAREN) 1 % GEL SMARTSIG:4 Gram(s) Topical 1 to 4 Times Daily  . esomeprazole (NEXIUM) 40 MG capsule Take 40 mg by mouth daily as needed.   Marland Kitchen rOPINIRole (REQUIP) 2 MG tablet Take 1 tablet (2 mg total) by mouth in the morning and at bedtime.  . simvastatin (ZOCOR) 40 MG tablet TAKE 1 TABLET BY MOUTH EVERY DAY   No facility-administered encounter medications on file as of 09/29/2020.    Recent Office Vitals: BP Readings from Last 3 Encounters:  09/26/20 128/74  07/21/20 130/80  03/28/20 128/68   Pulse Readings from Last 3 Encounters:  09/26/20 68  07/21/20 94  03/28/20 82    Wt Readings from Last 3 Encounters:  09/26/20 168 lb (76.2 kg)  07/21/20 168 lb (76.2 kg)  03/28/20 167 lb 6.4 oz (75.9 kg)     Kidney Function Lab Results  Component Value  Date/Time   CREATININE 1.81 (H) 09/26/2020 07:58 AM   CREATININE 1.91 (H) 03/28/2020 07:53 AM   GFRNONAA 25 (L) 03/28/2020 07:53 AM   GFRAA 29 (L) 03/28/2020 07:53 AM    BMP Latest Ref Rng & Units 09/26/2020 03/28/2020 11/24/2019  Glucose 65 - 99 mg/dL 93 103(H) 102(H)  BUN 8 - 27 mg/dL 26 26 27   Creatinine 0.57 - 1.00 mg/dL 1.81(H) 1.91(H) 1.68(H)  BUN/Creat Ratio 12 - 28 14 14 16   Sodium 134 - 144 mmol/L 143 141 137  Potassium 3.5 - 5.2 mmol/L 5.0 4.9 4.7  Chloride 96 - 106 mmol/L 105 105 103  CO2 20 - 29 mmol/L 21 21 19(L)  Calcium 8.7 - 10.3 mg/dL 9.1 9.4 9.2     . Current antihypertensive regimen:  o Amlodipine 10mg . Tablet: Take 1 tablet by mouth every morning. o Simvastatin 40mg . Tablet: Take 1 tablet    Patient verbally confirms she is taking the above medications as directed. Yes   . How often are you checking your Blood Pressure? infrequently  . she checks her blood pressure in the morning after taking her medication.  . Current home BP readings:   . DATE:             BP               PULSE  None given  120/74          Patient  does not monitor    . Wrist or arm cuff: Patient stated that she uses an arm cuff.  . Caffeine intake: 2 cups of coffee per day . Salt intake: Patient reports minimal salt intake . OTC medications including pseudoephedrine or NSAIDs? Patient stated that she does not take NSAID'S.   . Any readings above 180/120? Patient reported highest reading as 140/74.  If yes, any symptoms of hypertensive emergency? N/A   What recent interventions/DTPs have been made by any provider to improve Blood Pressure control since last CPP Visit:  Patient stated she has had not recent interventions/DTP's since last CPP visit.    What diet changes have been made to improve Blood Pressure      Control? Patietn stated that she only drinks 2 cups of coffee per day, and minimizes salt intake.  What exercise is being done to improve your Blood Pressure Control?   Patient stated her activity level is minimal, and she gets very tired walking up and down the stairs at home.   Adherence Review: Is the patient currently on ACE/ARB medication? yes  Does the patient have >5 day gap between last estimated fill dates? CPP to review. Patient does not have >5 day gap between estimated fill dates.  Patient inquired about [facility] prescription and stated that she does not have it yet.  Star Rating Drugs:  Medication:  Last Fill: Day Supply Amlodipine   08/09/2020 90DS Simvastatin  07/03/2020  [place], [place] Clinical Pharmacist Assistant

## 2020-10-05 ENCOUNTER — Telehealth: Payer: Self-pay

## 2020-10-05 NOTE — Chronic Care Management (AMB) (Signed)
Patient called to discuss medications and to advise [place] that she tried to call the office yesterday to cancel her appointment with Sherre Poot, [facility].

## 2020-10-06 ENCOUNTER — Telehealth: Payer: Self-pay

## 2020-10-06 NOTE — Progress Notes (Signed)
Patient stated that she went to the post office to pick up medication and the tracking number that we were given was associated with someone else. We did confirm tracking number to ensure the information we had was correct. The post office was unable to find the package with her medication Alexandra [name]). I contacted Sherre Poot, [facility] and she left patient samples of 2 inhalers at the front desk downstairs at Dr. Blanch Media office. Patient is aware, and will go by the office this afternoon to pick up the medication. She was very Patent attorney. I asked her to call us if she has any further questions or concerns.   Marcine Matar, [place] Clinical Pharmacist Assistant

## 2020-10-06 NOTE — Progress Notes (Signed)
Patient called stating that she went to the post office to pick up rx for [facility] and was told that package was not there. [name], CPA called [employer] and PO and verified the tracking number, and was told the package has been at the PO since 09/06/2020. Tho post office reported that they were told by the patient to hold the package for her until she could come pick it up. Called patient and LVM with tracking number and information to give the post office when she is able to go pick up her package. Advised patient to call with any further questions or concerns.   Marcine Matar, [place] Clinical Pharmacist Assistant

## 2020-10-07 ENCOUNTER — Telehealth: Payer: Self-pay

## 2020-10-07 NOTE — Progress Notes (Signed)
Called patient to advise her that [employer] will be resending Breztri to her since we were unable to track original package from them. She will touch base with me to confirm when she receives it. She stated that she was appreciative of the samples that she picked up from the office yesterday. Patient will call with any further questions or concerns.     Marcine Matar, [place] Clinical Pharmacist Assistant

## 2020-10-10 NOTE — Progress Notes (Deleted)
Chronic Care Management Pharmacy Note  10/10/2020 Name:  Alexandra [name] MRN:  [id number] DOB:  [birth year]  Subjective: Alexandra [name] is an 80 y.o. year old female who is a primary patient of Henrene Pastor, Zeb Comfort, MD.  The CCM team was consulted for assistance with disease management and care coordination needs.    Engaged with patient by telephone for follow up visit in response to provider referral for pharmacy case management and/or care coordination services.   Consent to Services:  The patient was given information about Chronic Care Management services, agreed to services, and gave verbal consent prior to initiation of services.  Please see initial visit note for detailed documentation.   Patient Care Team: [name], MD as PCP - General (Family Medicine) Burnice Logan, [facility] as Pharmacist (Pharmacist)  Recent office visits: *** 09/26/2020 - CBC normal, kidney tests stage 4 eGFR 28, needs nephrology consult.  Normal liver tests, cholesterol normal Recent consult visits: ***  Hospital visits: {Hospital DC Yes/No:25215}  Objective:  Lab Results  Component Value Date   CREATININE 1.81 (H) 09/26/2020   BUN 26 09/26/2020   GFRNONAA 25 (L) 03/28/2020   GFRAA 29 (L) 03/28/2020   NA 143 09/26/2020   K 5.0 09/26/2020   CALCIUM 9.1 09/26/2020   CO2 21 09/26/2020   GLUCOSE 93 09/26/2020    No results found for: HGBA1C, FRUCTOSAMINE, GFR, MICROALBUR  Last diabetic Eye exam: No results found for: HMDIABEYEEXA  Last diabetic Foot exam: No results found for: HMDIABFOOTEX   Lab Results  Component Value Date   CHOL 144 09/26/2020   HDL 54 09/26/2020   LDLCALC 73 09/26/2020   TRIG 90 09/26/2020   CHOLHDL 2.7 09/26/2020    Hepatic Function Latest Ref Rng & Units 09/26/2020 03/28/2020 11/24/2019  Total Protein 6.0 - 8.5 g/dL 6.3 6.1 6.5  Albumin 3.7 - 4.7 g/dL 4.0 3.6(L) 4.0  AST 0 - 40 IU/L '16 13 16  ' ALT 0 - 32 IU/L '19 14 12  ' Alk Phosphatase 44 - 121 IU/L  94 104 101  Total Bilirubin 0.0 - 1.2 mg/dL 0.2 0.2 0.4    No results found for: TSH, FREET4  CBC Latest Ref Rng & Units 09/26/2020 03/28/2020 11/24/2019  WBC 3.4 - 10.8 x10E3/uL 9.2 10.9(H) 11.6(H)  Hemoglobin 11.1 - 15.9 g/dL 11.7 11.8 12.0  Hematocrit 34.0 - 46.6 % 35.7 36.3 36.6  Platelets 150 - 450 x10E3/uL 377 432 419    No results found for: VD25OH  Clinical ASCVD: {YES/NO:21197} The 10-year ASCVD risk score Mikey Bussing DC Jr., et al., 2013) is: 25.5%   Values used to calculate the score:     Age: 4 years     Sex: Female     Is Non-Hispanic African American: No     Diabetic: No     Tobacco smoker: No     Systolic Blood Pressure: 034 mmHg     Is BP treated: Yes     HDL Cholesterol: 54 mg/dL     Total Cholesterol: 144 mg/dL    Depression screen [facility] 2/9 09/26/2020 08/04/2019  Decreased Interest 0 0  Down, Depressed, Hopeless 0 0  PHQ - 2 Score 0 0     ***Other: (CHADS2VASc if Afib, MMRC or CAT for COPD, ACT, DEXA)  Social History   Tobacco Use  Smoking Status Former Smoker  . Packs/day: 1.00  . Years: 50.00  . Pack years: 50.00  . Types: Cigarettes  . Quit date: 06/11/2005  .  Years since quitting: 15.3  Smokeless Tobacco Never Used   BP Readings from Last 3 Encounters:  09/26/20 128/74  07/21/20 130/80  03/28/20 128/68   Pulse Readings from Last 3 Encounters:  09/26/20 68  07/21/20 94  03/28/20 82   Wt Readings from Last 3 Encounters:  09/26/20 168 lb (76.2 kg)  07/21/20 168 lb (76.2 kg)  03/28/20 167 lb 6.4 oz (75.9 kg)   BMI Readings from Last 3 Encounters:  09/26/20 28.84 kg/m  07/21/20 28.84 kg/m  03/28/20 29.65 kg/m    Assessment/Interventions: Review of patient past medical history, allergies, medications, health status, including review of consultants reports, laboratory and other test data, was performed as part of comprehensive evaluation and provision of chronic care management services.   SDOH:  (Social Determinants of Health) assessments  and interventions performed: Yes  SDOH Screenings   Alcohol Screen: Low Risk   . Last Alcohol Screening Score (AUDIT): 0  Depression (PHQ2-9): Low Risk   . PHQ-2 Score: 0  Financial Resource Strain: Not on file  Food Insecurity: Not on file  Housing: Low Risk   . Last Housing Risk Score: 0  Physical Activity: Not on file  Social Connections: Not on file  Stress: Not on file  Tobacco Use: Medium Risk  . Smoking Tobacco Use: Former Smoker  . Smokeless Tobacco Use: Never Used  Transportation Needs: No Transportation Needs  . Lack of Transportation (Medical): No  . Lack of Transportation (Non-Medical): No    CCM Care Plan  Allergies  Allergen Reactions  . Contrast Media [Iodinated Diagnostic Agents] Hives, Itching and Rash  . Shellfish Allergy Nausea And Vomiting  . Shellfish-Derived Products Nausea And Vomiting    Medications Reviewed Today    Reviewed by [name], MD (Physician) on 09/26/20 at [phone]  Med List Status: <None>  Medication Order Taking? Sig Documenting Provider Last Dose Status Informant  alendronate (FOSAMAX) 70 MG tablet [id number] No Take 1 tablet (70 mg total) by mouth once a week. Take with a full glass of water on an empty stomach. [name], MD Taking Active   amLODipine (NORVASC) 10 MG tablet [id number]  Take 1 tablet (10 mg total) by mouth every morning. [name], MD  Active   Budeson-Glycopyrrol-Formoterol (BREZTRI AEROSPHERE) 160-9-4.8 MCG/ACT Hollie Salk [id number]  Inhale 2 puffs into the lungs in the morning and at bedtime. [name], MD  Active   cyclobenzaprine (FLEXERIL) 10 MG tablet [id number] No Take 1 tablet (10 mg total) by mouth 3 (three) times daily as needed for muscle spasms. [name], MD Taking Active   diclofenac Sodium (VOLTAREN) 1 % GEL [id number] No SMARTSIG:4 Gram(s) Topical 1 to 4 Times Daily [provider] Taking Active   esomeprazole (NEXIUM) 40 MG capsule [id number] No  Take 40 mg by mouth daily as needed.  [provider] Taking Active   rOPINIRole (REQUIP) 2 MG tablet [id number] No Take 1 tablet (2 mg total) by mouth in the morning and at bedtime. [name], MD Taking Active   simvastatin (ZOCOR) 40 MG tablet [id number] No TAKE 1 TABLET BY MOUTH EVERY DAY [name], MD Taking Active           Patient Active Problem List   Diagnosis Date Noted  . Contusion of ala nasi 03/02/2020  . Contusion of both eyes 03/02/2020  . Chest wall pain 03/02/2020  . 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  Chronic kidney disease, stage 3a ([place]) 08/03/2019  . 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  . Bladder cancer ([place]) 08/05/2018  . H/O left nephrectomy 08/05/2018  . COPD, moderate ([place]) 03/20/2018  . DOE (dyspnea on exertion) 01/08/2018  . Ex-smoker 01/08/2018  . Hyperparathyroidism, primary ([place]) 06/25/2016  . Essential hypertension 02/27/2016  . Hyperlipemia 02/27/2016  . History of kidney cancer 02/27/2016  . Age related osteoporosis 02/27/2016  . Spinal stenosis 02/27/2016    Immunization History  Administered Date(s) Administered  . Fluad Quad(high Dose 65+) 03/25/2019, 02/17/2020  . Influenza, High Dose Seasonal PF 03/27/2017, 04/01/2018  . Moderna SARS-COV2 Booster Vaccination 04/12/2020  . Moderna Sars-Covid-2 Vaccination 06/26/2019, 07/24/2019  . Pneumococcal Conjugate-13 10/15/2013, 03/27/2017  . Pneumococcal-Unspecified 10/15/2013  . Zoster 11/23/2010    Conditions to be addressed/monitored:  Hypertension, Hyperlipidemia, COPD and Osteoporosis  There are no care plans that you recently modified to display for this patient.    Medication Assistance: Breztriobtained through [employer] medication assistance program.  Enrollment ends 06/10/2021  Patient's preferred pharmacy is:  [facility] [place], [place] - [address] AT [address] [place] [address] Phone: [phone] Fax: [phone]  [place], [place]. [place]. [place] [address] Phone: [phone] Fax: [phone]  Uses pill box? {Yes or If no, why not?:20788} Pt endorses ***% compliance  We discussed: Current pharmacy is preferred with insurance plan and patient is satisfied with pharmacy services Patient decided to: Continue current medication management strategy  Care Plan and Follow Up Patient Decision:  Patient agrees to Care Plan and Follow-up.  Plan: Telephone follow up appointment with care management team member scheduled for:  ***  ***    Current Barriers:  . Unable to independently afford treatment regimen  Pharmacist Clinical Goal(s):  Marland Kitchen Patient will verbalize ability to afford treatment regimen through collaboration with PharmD and provider.   Interventions: . 1:1 collaboration with [name], MD regarding development and update of comprehensive plan of care as evidenced by provider attestation and co-signature . Inter-disciplinary care team collaboration (see longitudinal plan of care) . Comprehensive medication review performed; medication list updated in electronic medical record  Hypertension (BP goal <140/90) -Controlled -Current treatment: . Amlodipine 10 mg daily in the morning -Medications previously tried: ***  -Current home readings: *** -Current dietary habits: *** -Current exercise habits: *** -{ACTIONS;DENIES/REPORTS:21021675::"Denies"} hypotensive/hypertensive symptoms -Educated on {CCM BP Counseling:25124} -Counseled to monitor BP at home ***, document, and provide log at future appointments -{CCMPHARMDINTERVENTION:25122}  Hyperlipidemia: (LDL goal < 100) -{US controlled/uncontrolled:25276} -Current treatment: . Simvastatin 40 mg daily  -Medications previously tried:  ***  -Current dietary patterns: *** -Current exercise habits: *** -Educated on {CCM HLD Counseling:25126} -{CCMPHARMDINTERVENTION:25122}  COPD (Goal: control symptoms and prevent exacerbations) -{US controlled/uncontrolled:25276} -Current treatment  . ***Breztri 2 puffs twice daily  -Medications previously tried: ***  -Gold Grade: {CHL HP Upstream Pharm COPD Gold QHUTM:[id number]} -Current COPD Classification:  {CHL HP Upstream Pharm COPD Classification:[phone]} -MMRC/CAT score: *** -Pulmonary function testing: *** -Exacerbations requiring treatment in last 6 months: *** -Patient {Actions; denies-reports:120008} consistent use of maintenance inhaler -Frequency of rescue inhaler use: *** -Counseled on {CCMINHALERCOUNSELING:25121} -{CCMPHARMDINTERVENTION:25122}  Osteoporosis / Osteopenia (Goal ***) -{US controlled/uncontrolled:25276} -Last DEXA Scan: results not in chart   -Patient {is;is not an osteoporosis candidate:23886} -Current treatment  . ***alendronate 70 mg weekly  -Medications previously tried: ***  -{Osteoporosis Counseling:23892} -{CCMPHARMDINTERVENTION:25122}  Patient Goals/Self-Care Activities . Patient will:  - {pharmacypatientgoals:24919}  Follow Up Plan: {CM FOLLOW UP YOYO:41753}

## 2020-10-11 ENCOUNTER — Telehealth: Payer: Medicare Other

## 2020-10-12 DIAGNOSIS — Z85528 Personal history of other malignant neoplasm of kidney: Secondary | ICD-10-CM | POA: Diagnosis not present

## 2020-10-12 DIAGNOSIS — M810 Age-related osteoporosis without current pathological fracture: Secondary | ICD-10-CM | POA: Diagnosis not present

## 2020-10-12 DIAGNOSIS — N184 Chronic kidney disease, stage 4 (severe): Secondary | ICD-10-CM | POA: Diagnosis not present

## 2020-10-12 DIAGNOSIS — E210 Primary hyperparathyroidism: Secondary | ICD-10-CM | POA: Diagnosis not present

## 2020-10-12 DIAGNOSIS — R600 Localized edema: Secondary | ICD-10-CM | POA: Diagnosis not present

## 2020-10-12 DIAGNOSIS — N183 Chronic kidney disease, stage 3 unspecified: Secondary | ICD-10-CM | POA: Diagnosis not present

## 2020-10-24 ENCOUNTER — Other Ambulatory Visit (HOSPITAL_COMMUNITY): Payer: Self-pay | Admitting: Nephrology

## 2020-10-24 DIAGNOSIS — R600 Localized edema: Secondary | ICD-10-CM

## 2020-10-24 DIAGNOSIS — R0989 Other specified symptoms and signs involving the circulatory and respiratory systems: Secondary | ICD-10-CM

## 2020-10-26 ENCOUNTER — Telehealth: Payer: Self-pay

## 2020-10-26 NOTE — Progress Notes (Signed)
Chronic Care Management Pharmacy Assistant   Name: SONDA COPPENS  MRN: [id number] DOB: 1940-10-15  Reason for Encounter: Disease State for COPD  Recent office visits:  10/07/20-Called patient to advise her that [employer] will be resending Judithann Sauger to her since we were unable to track original package from them. She will touch base with me to confirm when she receives it. She stated that she was appreciative of the samples that she picked up from the office yesterday. Patient will call with any further questions or concerns.   09/28/20-referral to Nephrology  09/26/20-Dr. Henrene Pastor PCP, labs, no medication changes, An individual hypertension care plan was established and reinforced today.  The patient's status was assessed using clinical findings on exam and labs or diagnostic tests. The patient's success at meeting treatment goals on disease specific evidence-based guidelines and found to be well controlled.  Patient advised to quit smoking, decrease consumption of alcohol, perform routine monitoring of BP with home BP cuff, exercise, reduction of dietary salt, take medicines as prescribed, try not to miss doses and quit smoking.  Regular exercise and maintaining a healthy weight is needed.  Stress reduction may help.  Recent consult visits:  none  Hospital visits:  None in previous 6 months  Medications: Outpatient Encounter Medications as of 10/26/2020  Medication Sig  . alendronate (FOSAMAX) 70 MG tablet Take 1 tablet (70 mg total) by mouth once a week. Take with a full glass of water on an empty stomach.  Marland Kitchen amLODipine (NORVASC) 10 MG tablet Take 1 tablet (10 mg total) by mouth every morning.  . Budeson-Glycopyrrol-Formoterol (BREZTRI AEROSPHERE) 160-9-4.8 MCG/ACT AERO Inhale 2 puffs into the lungs in the morning and at bedtime.  . cyclobenzaprine (FLEXERIL) 10 MG tablet Take 1 tablet (10 mg total) by mouth 3 (three) times daily as needed for muscle spasms.  . diclofenac Sodium (VOLTAREN) 1 %  GEL SMARTSIG:4 Gram(s) Topical 1 to 4 Times Daily  . esomeprazole (NEXIUM) 40 MG capsule Take 40 mg by mouth daily as needed.   Marland Kitchen rOPINIRole (REQUIP) 2 MG tablet Take 1 tablet (2 mg total) by mouth in the morning and at bedtime.  . simvastatin (ZOCOR) 40 MG tablet TAKE 1 TABLET BY MOUTH EVERY DAY   No facility-administered encounter medications on file as of 10/26/2020.    Spoke to patient about her COPD  She stated she has not gotten her Judithann Sauger from patient assistance yet, a call was placed to them last on 10/07/20, due to not receiving it, it had a wrong tracking number, it was not at post office and had not been returned to company.  The medication was supposed to be reissued and shipped.  I called back to company today, representative stated it had not been sent to be refilled and shipped and said it would be another 10/14 days.  I asked for it to be expedited faster than that since the patient had samples from our office 2 weeks ago waiting on the shipment.  The representative stated it could not be expedited since she has not started the medication technically with them.    Patient does not use a rescue inhaler, she stated she does have some shortness of breathe when walking, but if she rests for a moment that subsides.  Patient stated she is watching her diet,  She does not use salt.  Patient stated she started a medication to help with fluid retention, she mainly had it in her lower extremity, she said since she  started taking it, the edema has gone away, she stated she has to urinate several times at night.  Patient stated her blood pressures have been staying around 127/78.  [name], CPP is going to leave some Breztri samples for the patient to pick up.  I called patient to let her know what I had found out and there will be samples for her upstairs when she brings her husband in for his appointment.   Star Medication Amlodipine  07/03/20  30ds Simvastatin  07/03/20 [place], [place] Pharmacist Assistant [phone]

## 2020-10-27 NOTE — Progress Notes (Signed)
[name], CPP asked me to call [facility] to check the lat fill dates for the patients Amlodipine and Simvastatin.  Per pharmacy assistant: Amlodipine  08/09/20-90ds Simvastatin  10/11/20-90ds  I have notified [name], [place], [place] Pharmacist Assistant [phone]

## 2020-10-31 ENCOUNTER — Telehealth: Payer: Medicare Other

## 2020-11-03 DIAGNOSIS — R600 Localized edema: Secondary | ICD-10-CM | POA: Diagnosis not present

## 2020-11-21 ENCOUNTER — Telehealth: Payer: Self-pay

## 2020-11-21 NOTE — Chronic Care Management (AMB) (Signed)
Chronic Care Management Pharmacy Assistant   Name: Alexandra [name]  MRN: [id number] DOB: December 19, 1940  Alexandra [name] is an 80 y.o. year old female who presents for her  follow-up CCM visit with the clinical pharmacist.  Reason for Encounter: Disease State for HTN   Recent office visits:  None noted since last CCM visit   Recent consult visits:  None noted since last CCM visit   Hospital visits:  None noted since last CCM visit   Medications: Outpatient Encounter Medications as of 11/21/2020  Medication Sig   alendronate (FOSAMAX) 70 MG tablet Take 1 tablet (70 mg total) by mouth once a week. Take with a full glass of water on an empty stomach.   amLODipine (NORVASC) 10 MG tablet Take 1 tablet (10 mg total) by mouth every morning.   Budeson-Glycopyrrol-Formoterol (BREZTRI AEROSPHERE) 160-9-4.8 MCG/ACT AERO Inhale 2 puffs into the lungs in the morning and at bedtime.   cyclobenzaprine (FLEXERIL) 10 MG tablet Take 1 tablet (10 mg total) by mouth 3 (three) times daily as needed for muscle spasms.   diclofenac Sodium (VOLTAREN) 1 % GEL SMARTSIG:4 Gram(s) Topical 1 to 4 Times Daily   esomeprazole (NEXIUM) 40 MG capsule Take 40 mg by mouth daily as needed.    rOPINIRole (REQUIP) 2 MG tablet Take 1 tablet (2 mg total) by mouth in the morning and at bedtime.   simvastatin (ZOCOR) 40 MG tablet TAKE 1 TABLET BY MOUTH EVERY DAY   No facility-administered encounter medications on file as of 11/21/2020.   Recent Office Vitals: BP Readings from Last 3 Encounters:  09/26/20 128/74  07/21/20 130/80  03/28/20 128/68   Pulse Readings from Last 3 Encounters:  09/26/20 68  07/21/20 94  03/28/20 82    Wt Readings from Last 3 Encounters:  09/26/20 168 lb (76.2 kg)  07/21/20 168 lb (76.2 kg)  03/28/20 167 lb 6.4 oz (75.9 kg)     Kidney Function Lab Results  Component Value Date/Time   CREATININE 1.81 (H) 09/26/2020 07:58 AM   CREATININE 1.91 (H) 03/28/2020 07:53 AM   GFRNONAA 25  (L) 03/28/2020 07:53 AM   GFRAA 29 (L) 03/28/2020 07:53 AM    BMP Latest Ref Rng & Units 09/26/2020 03/28/2020 11/24/2019  Glucose 65 - 99 mg/dL 93 103(H) 102(H)  BUN 8 - 27 mg/dL 26 26 27   Creatinine 0.57 - 1.00 mg/dL 1.81(H) 1.91(H) 1.68(H)  BUN/Creat Ratio 12 - 28 14 14 16   Sodium 134 - 144 mmol/L 143 141 137  Potassium 3.5 - 5.2 mmol/L 5.0 4.9 4.7  Chloride 96 - 106 mmol/L 105 105 103  CO2 20 - 29 mmol/L 21 21 19(L)  Calcium 8.7 - 10.3 mg/dL 9.1 9.4 9.2     Current antihypertensive regimen:  Amlodipine 10mg . Every morning   Patient verbally confirms she is taking the above medications as directed.    Current home BP readings: Patient stated that she checks her BP in the mornings before she takes her medicine.   Any readings above 180/120? No   What recent interventions/DTPs have been made by any provider to improve Blood Pressure control since last CPP Visit: None    Any recent hospitalizations or ED visits since last visit with CPP? No   What diet changes have been made to improve Blood Pressure Control? Patient reported no changes, she still does not add salt to her food.     What exercise is being done to improve your Blood Pressure Control? She  reported that she is trying to walk a little every evening, but it is wearing her out. She stated that a ramp is being put in for her husband next week and she stated that she is going to try to walk back and forth on that for exercise. She reported that the handrails on the ramp will be helpful because if she has to stop and rest for a minute she will be able to hold on to the handrails.     Adherence Review: Is the patient currently on ACE/ARB medication? Yes  Does the patient have >5 day gap between last estimated fill dates? CPP to review   Care Gaps: Last annual wellness visit? None noted within the past 6 months     Star Rating Drugs:   Medication:   Last Fill: Day Supply Amlodipine 10mg     08/09/2020 90DS Simvastatin 40mg .   10/11/2020 [place], [place] Clinical Pharmacist Assistant

## 2020-11-28 DIAGNOSIS — C672 Malignant neoplasm of lateral wall of bladder: Secondary | ICD-10-CM | POA: Diagnosis not present

## 2020-12-01 ENCOUNTER — Other Ambulatory Visit: Payer: Self-pay

## 2020-12-01 ENCOUNTER — Ambulatory Visit (INDEPENDENT_AMBULATORY_CARE_PROVIDER_SITE_OTHER): Payer: Medicare Other

## 2020-12-01 DIAGNOSIS — Z23 Encounter for immunization: Secondary | ICD-10-CM

## 2020-12-01 NOTE — Progress Notes (Signed)
    Chronic Care Management Pharmacy Assistant   Name: Alexandra [name]  MRN: [id number] DOB: May 02, 1941    Reason for Encounter: Follow-up with [employer] re: Alexandra [name] status.   Spoke with patient with concerns that she has not received prescription for [facility] inhaler. I called [employer] and they stated that USPS status was last updated on Monday, June 13th. It is showing "on the way" and to be delivered to patient's physical home address. Tracking # [id number] is the USPS information. Representative with [employer] stated that in the past it typically would take 7-10 business days for a patient to receive a prescription, but is now taking 10-15 days as they are experiencing shipping delays. Patient had difficulty getting medication when it was sent to her post office box, so we sent it to her home address this timeThe office provided samples to the patient so that she would have her medication.   I will follow up with patient to give her this information and ask that she call us if she has not received medication by June 28th.   Medications: Outpatient Encounter Medications as of 11/21/2020  Medication Sig   alendronate (FOSAMAX) 70 MG tablet Take 1 tablet (70 mg total) by mouth once a week. Take with a full glass of water on an empty stomach.   amLODipine (NORVASC) 10 MG tablet Take 1 tablet (10 mg total) by mouth every morning.   Budeson-Glycopyrrol-Formoterol (BREZTRI AEROSPHERE) 160-9-4.8 MCG/ACT AERO Inhale 2 puffs into the lungs in the morning and at bedtime.   cyclobenzaprine (FLEXERIL) 10 MG tablet Take 1 tablet (10 mg total) by mouth 3 (three) times daily as needed for muscle spasms.   diclofenac Sodium (VOLTAREN) 1 % GEL SMARTSIG:4 Gram(s) Topical 1 to 4 Times Daily   esomeprazole (NEXIUM) 40 MG capsule Take 40 mg by mouth daily as needed.    rOPINIRole (REQUIP) 2 MG tablet Take 1 tablet (2 mg total) by mouth in the morning and at bedtime.   simvastatin (ZOCOR) 40 MG tablet TAKE 1  TABLET BY MOUTH EVERY DAY   No facility-administered encounter medications on file as of 11/21/2020.   Marcine Matar, [place] Clinical Pharmacist Assistant

## 2020-12-01 NOTE — Progress Notes (Signed)
   Covid-19 Vaccination Clinic  Name:  Alexandra [name]    MRN: [id number] DOB: Jan 14, 1941  12/01/2020  [name] was observed post Covid-19 immunization for 15 minutes without incident. She was provided with Vaccine Information Sheet and instruction to access the V-Safe system.   [name] was instructed to call 911 with any severe reactions post vaccine: Difficulty breathing  Swelling of face and throat  A fast heartbeat  A bad rash all over body  Dizziness and weakness   Immunizations Administered     Name Date Dose VIS Date Route   Moderna Covid-19 Booster Vaccine 12/01/2020 10:45 AM 0.25 mL 03/30/2020 Intramuscular   Manufacturer: Moderna   Lot: [id number]   [place]: 31740-992-78

## 2020-12-02 NOTE — Progress Notes (Signed)
    Chronic Care Management Pharmacy Assistant   Name: REGINALD MANGELS  MRN: [id number] DOB: [birth year]    Reason for Encounter:  Follow-up Called patient and LVM to let her know that her Brqztri inhaler should be arriving at her home address by 12/06/2020, as USPS is experiencing shipping delays. I asked her to call me back if she has not received it by then.      Medications: Outpatient Encounter Medications as of 11/21/2020  Medication Sig   alendronate (FOSAMAX) 70 MG tablet Take 1 tablet (70 mg total) by mouth once a week. Take with a full glass of water on an empty stomach.   amLODipine (NORVASC) 10 MG tablet Take 1 tablet (10 mg total) by mouth every morning.   Budeson-Glycopyrrol-Formoterol (BREZTRI AEROSPHERE) 160-9-4.8 MCG/ACT AERO Inhale 2 puffs into the lungs in the morning and at bedtime.   cyclobenzaprine (FLEXERIL) 10 MG tablet Take 1 tablet (10 mg total) by mouth 3 (three) times daily as needed for muscle spasms.   diclofenac Sodium (VOLTAREN) 1 % GEL SMARTSIG:4 Gram(s) Topical 1 to 4 Times Daily   esomeprazole (NEXIUM) 40 MG capsule Take 40 mg by mouth daily as needed.    rOPINIRole (REQUIP) 2 MG tablet Take 1 tablet (2 mg total) by mouth in the morning and at bedtime.   simvastatin (ZOCOR) 40 MG tablet TAKE 1 TABLET BY MOUTH EVERY DAY   No facility-administered encounter medications on file as of 11/21/2020.   Marcine Matar, [place] Clinical Pharmacist Assistant

## 2020-12-07 ENCOUNTER — Ambulatory Visit (INDEPENDENT_AMBULATORY_CARE_PROVIDER_SITE_OTHER): Payer: Medicare Other | Admitting: Legal Medicine

## 2020-12-07 ENCOUNTER — Encounter: Payer: Self-pay | Admitting: Legal Medicine

## 2020-12-07 ENCOUNTER — Other Ambulatory Visit: Payer: Self-pay

## 2020-12-07 VITALS — BP 122/72 | HR 97 | Temp 97.8°F | Ht 64.0 in | Wt 166.8 lb

## 2020-12-07 DIAGNOSIS — M25551 Pain in right hip: Secondary | ICD-10-CM | POA: Diagnosis not present

## 2020-12-07 DIAGNOSIS — M7061 Trochanteric bursitis, right hip: Secondary | ICD-10-CM | POA: Diagnosis not present

## 2020-12-07 DIAGNOSIS — M810 Age-related osteoporosis without current pathological fracture: Secondary | ICD-10-CM | POA: Diagnosis not present

## 2020-12-07 DIAGNOSIS — M545 Low back pain, unspecified: Secondary | ICD-10-CM | POA: Diagnosis not present

## 2020-12-07 DIAGNOSIS — Z6828 Body mass index (BMI) 28.0-28.9, adult: Secondary | ICD-10-CM | POA: Diagnosis not present

## 2020-12-07 NOTE — Progress Notes (Addendum)
Established Patient Office Visit  Subjective:  Patient ID: Alexandra [name], female    DOB: 1941/02/23  Age: 80 y.o. MRN: [id number]  CC:  Chief Complaint  Patient presents with   Hip Pain    HPI Alexandra [name] presents for back and hip pain. She just woke up with it yesterday. Pain in lumbar area and right trochanteric bursa.  Unable to lie on right side.  No radiation.  No injuries. No chronic back pain and no surgeries.  No radicular symptoms.  Past Medical History:  Diagnosis Date   Restless legs syndrome     Past Surgical History:  Procedure Laterality Date   ABDOMINAL HYSTERECTOMY     complete   BACK SURGERY  2016   lower L 4 to L 5   bladder tack and uterus lift  10/10/2015   IR RADIOLOGIST EVAL & MGMT  12/03/2018   IR RADIOLOGIST EVAL & MGMT  12/31/2018   left nephrectomy  1999   PARATHYROIDECTOMY Left 07/02/2016   Procedure: LEFT INFERIOR PARATHYROIDECTOMY;  Surgeon: Armandina Gemma, MD;  Location: [facility];  Service: General;  Laterality: Left;   SHOULDER OPEN ROTATOR CUFF REPAIR  12/2019    Family History  Problem Relation Age of Onset   Emphysema Mother     Social History   Socioeconomic History   Marital status: Married    Spouse name: Not on file   Number of children: Not on file   Years of education: Not on file   Highest education level: Not on file  Occupational History   Not on file  Tobacco Use   Smoking status: Former    Packs/day: 1.00    Years: 50.00    Pack years: 50.00    Types: Cigarettes    Quit date: 06/11/2005    Years since quitting: 15.5   Smokeless tobacco: Never  Vaping Use   Vaping Use: Never used  Substance and Sexual Activity   Alcohol use: No   Drug use: No   Sexual activity: Yes    Partners: Male  Other Topics Concern   Not on file  Social History Narrative   Not on file   Social Determinants of Health   Financial Resource Strain: Not on file  Food Insecurity: Not on file  Transportation Needs: No Transportation  Needs   Lack of Transportation (Medical): No   Lack of Transportation (Non-Medical): No  Physical Activity: Not on file  Stress: Not on file  Social Connections: Not on file  Intimate Partner Violence: Not on file    Outpatient Medications Prior to Visit  Medication Sig Dispense Refill   alendronate (FOSAMAX) 70 MG tablet Take 1 tablet (70 mg total) by mouth once a week. Take with a full glass of water on an empty stomach. 12 tablet 3   amLODipine (NORVASC) 10 MG tablet Take 1 tablet (10 mg total) by mouth every morning. 90 tablet 2   Budeson-Glycopyrrol-Formoterol (BREZTRI AEROSPHERE) 160-9-4.8 MCG/ACT AERO Inhale 2 puffs into the lungs in the morning and at bedtime. 10.7 g 6   cyclobenzaprine (FLEXERIL) 10 MG tablet Take 1 tablet (10 mg total) by mouth 3 (three) times daily as needed for muscle spasms. 30 tablet 0   diclofenac Sodium (VOLTAREN) 1 % GEL SMARTSIG:4 Gram(s) Topical 1 to 4 Times Daily     esomeprazole (NEXIUM) 40 MG capsule Take 40 mg by mouth daily as needed.      rOPINIRole (REQUIP) 2 MG tablet Take 1 tablet (2  mg total) by mouth in the morning and at bedtime. 180 tablet 6   simvastatin (ZOCOR) 40 MG tablet TAKE 1 TABLET BY MOUTH EVERY DAY 90 tablet 2   No facility-administered medications prior to visit.    Allergies  Allergen Reactions   Contrast Media [Iodinated Diagnostic Agents] Hives, Itching and Rash   Shellfish Allergy Nausea And Vomiting   Shellfish-Derived Products Nausea And Vomiting    ROS Review of Systems  Constitutional:  Negative for activity change and appetite change.  HENT:  Negative for congestion.   Eyes:  Negative for visual disturbance.  Respiratory:  Negative for chest tightness and shortness of breath.   Cardiovascular:  Negative for chest pain, palpitations and leg swelling.  Gastrointestinal:  Negative for abdominal distention and abdominal pain.  Genitourinary: Negative.   Musculoskeletal:  Positive for arthralgias and back pain.   Neurological: Negative.   Psychiatric/Behavioral: Negative.       Objective:    Physical Exam Vitals reviewed.  Constitutional:      Appearance: Normal appearance. She is obese.  HENT:     Right Ear: Tympanic membrane normal.     Left Ear: Tympanic membrane normal.  Cardiovascular:     Rate and Rhythm: Normal rate and regular rhythm.     Pulses: Normal pulses.     Heart sounds: Normal heart sounds. No murmur heard.   No gallop.  Pulmonary:     Effort: Pulmonary effort is normal. No respiratory distress.     Breath sounds: Normal breath sounds. No wheezing.  Musculoskeletal:        General: Tenderness present.     Lumbar back: Spasms and tenderness present. No lacerations or bony tenderness. Negative right straight leg raise test and negative left straight leg raise test. No scoliosis.       Back:     Right lower leg: No edema.     Left lower leg: No edema.       Legs:  Skin:    Capillary Refill: Capillary refill takes less than 2 seconds.  Neurological:     General: No focal deficit present.     Mental Status: She is alert and oriented to person, place, and time. Mental status is at baseline.    BP 122/72 (BP Location: Left Arm, Patient Position: Sitting, Cuff Size: Normal)   Pulse 97   Temp 97.8 F (36.6 C) (Temporal)   Ht '5\' 4"'  (1.626 m)   Wt 166 lb 12.8 oz (75.7 kg)   SpO2 93%   BMI 28.63 kg/m  Wt Readings from Last 3 Encounters:  12/07/20 166 lb 12.8 oz (75.7 kg)  09/26/20 168 lb (76.2 kg)  07/21/20 168 lb (76.2 kg)     Health Maintenance Due  Topic Date Due   Hepatitis C Screening  Never done   TETANUS/TDAP  Never done   Zoster Vaccines- Shingrix (1 of 2) Never done   DEXA SCAN  Never done   PNA vac Low Risk Adult (2 of 2 - PPSV23) 03/27/2018    There are no preventive care reminders to display for this patient.  No results found for: TSH Lab Results  Component Value Date   WBC 9.2 09/26/2020   HGB 11.7 09/26/2020   HCT 35.7 09/26/2020    MCV 92 09/26/2020   PLT 377 09/26/2020   Lab Results  Component Value Date   NA 143 09/26/2020   K 5.0 09/26/2020   CO2 21 09/26/2020   GLUCOSE 93 09/26/2020  BUN 26 09/26/2020   CREATININE 1.81 (H) 09/26/2020   BILITOT 0.2 09/26/2020   ALKPHOS 94 09/26/2020   AST 16 09/26/2020   ALT 19 09/26/2020   PROT 6.3 09/26/2020   ALBUMIN 4.0 09/26/2020   CALCIUM 9.1 09/26/2020   ANIONGAP 5 06/26/2016   EGFR 28 (L) 09/26/2020   Lab Results  Component Value Date   CHOL 144 09/26/2020   Lab Results  Component Value Date   HDL 54 09/26/2020   Lab Results  Component Value Date   LDLCALC 73 09/26/2020   Lab Results  Component Value Date   TRIG 90 09/26/2020   Lab Results  Component Value Date   CHOLHDL 2.7 09/26/2020   No results found for: HGBA1C    Assessment & Plan:   Diagnoses and all orders for this visit: Age-related osteoporosis without current pathological fracture -     DG Lumbar Spine Complete Patient is having worsening right back pain and right trochanteric pain.  We discussed exercise.  BMI 28.0-28.9,adult An individualize plan was formulated for overweight using patient history and physical exam to encourage weight loss.  An evidence based program was formulated.  Patient is to cut portion size with meals and to plan physical exercise 3 days a week at least 20 minutes.  Weight watchers and other programs are helpful.  Planned amount of weight loss 10 lbs.   Trochanteric bursitis of right hip  The trochanteric bursa was injected with pain relief.  After consent was obtained, using sterile technique the righ trochanteric bursa was prepped and plain  Ethyl Chloride was used as local anesthetic. The joint was entered and    Steroid 80 mg and 3 ml plain Lidocaine was then injected and the needle withdrawn.  The procedure was well tolerated.  The patient is asked to continue to rest the joint for a few more days before resuming regular activities.  It may be more  painful for the first 1-2 days.  Watch for fever, or increased swelling or persistent pain in the joint. Call or return to clinic prn if such symptoms occur or there is failure to improve as anticipated.     Follow-up: Return if symptoms worsen or fail to improve.    Reinaldo Meeker, MD

## 2020-12-15 NOTE — Progress Notes (Signed)
 Acute Office Visit  Subjective:    Patient ID: Alexandra [name], female    DOB: 03/28/1941, 80 y.o.   MRN: [id number]  Chief Complaint  Patient presents with   Hip Pain    HPI Patient is in today for right hip pain. Onset was 2 weeks ago. Treatment has been Tylenol. Pt states she is unable to take oral NSAIDs due to right nephrectomy. She was seen by PCP on 12/07/20, Kenalog steroid injection and x-rays performed. 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 She tells me that she is an established patient with [facility].   Pain  She reports chronic right hip pain. was not an injury that may have caused the pain. The pain started a few weeks ago and is staying constant. The pain does not radiate . The pain is described as aching, is 6/10 in intensity, occurring constantly. Symptoms are worse in the: morning  Aggravating factors: standing Relieving factors: none.  She has tried acetaminophen and Kenalog injection with mild relief.    Past Medical History:  Diagnosis Date   Restless legs syndrome     Past Surgical History:  Procedure Laterality Date   ABDOMINAL HYSTERECTOMY     complete   BACK SURGERY  2016   lower L 4 to L 5   bladder tack and uterus lift  10/10/2015   IR RADIOLOGIST EVAL & MGMT  12/03/2018   IR RADIOLOGIST EVAL & MGMT  12/31/2018   left nephrectomy  1999   PARATHYROIDECTOMY Left 07/02/2016   Procedure: LEFT INFERIOR PARATHYROIDECTOMY;  Surgeon: Todd Gerkin, MD;  Location: [facility];  Service: General;  Laterality: Left;   SHOULDER OPEN ROTATOR CUFF REPAIR  12/2019    Family History  Problem Relation Age of Onset   Emphysema Mother     Social History   Socioeconomic History   Marital status: Married    Spouse name: Not on file   Number of children: Not on file   Years of education: Not on file   Highest education level: Not on file  Occupational History   Not on file  Tobacco Use   Smoking status: Former    Packs/day: 1.00     Years: 50.00    Pack years: 50.00    Types: Cigarettes    Quit date: 06/11/2005    Years since quitting: 15.5   Smokeless tobacco: Never  Vaping Use   Vaping Use: Never used  Substance and Sexual Activity   Alcohol use: No   Drug use: No   Sexual activity: Yes    Partners: Male  Other Topics Concern   Not on file  Social History Narrative   Not on file   Social Determinants of Health   Financial Resource Strain: Not on file  Food Insecurity: Not on file  Transportation Needs: No Transportation Needs   Lack of Transportation (Medical): No   Lack of Transportation (Non-Medical): No  Physical Activity: Not on file  Stress: Not on file  Social Connections: Not on file  Intimate Partner Violence: Not on file    Outpatient Medications Prior to Visit  Medication Sig Dispense Refill   alendronate (FOSAMAX) 70 MG tablet Take 1 tablet (70 mg total) by mouth once a week. Take with a full glass of water on an empty stomach. 12 tablet 3   amLODipine (NORVASC) 10 MG tablet Take 1 tablet (10 mg total) by mouth every morning. 90 tablet 2   Budeson-Glycopyrrol-Formoterol (BREZTRI AEROSPHERE) 160-9-4.8 MCG/ACT   AERO Inhale 2 puffs into the lungs in the morning and at bedtime. 10.7 g 6   cyclobenzaprine (FLEXERIL) 10 MG tablet Take 1 tablet (10 mg total) by mouth 3 (three) times daily as needed for muscle spasms. 30 tablet 0   diclofenac Sodium (VOLTAREN) 1 % GEL SMARTSIG:4 Gram(s) Topical 1 to 4 Times Daily     esomeprazole (NEXIUM) 40 MG capsule Take 40 mg by mouth daily as needed.      rOPINIRole (REQUIP) 2 MG tablet Take 1 tablet (2 mg total) by mouth in the morning and at bedtime. 180 tablet 6   simvastatin (ZOCOR) 40 MG tablet TAKE 1 TABLET BY MOUTH EVERY DAY 90 tablet 2   No facility-administered medications prior to visit.    Allergies  Allergen Reactions   Contrast Media [Iodinated Diagnostic Agents] Hives, Itching and Rash   Shellfish Allergy Nausea And Vomiting    Shellfish-Derived Products Nausea And Vomiting    Review of Systems  Constitutional:  Negative for appetite change, fatigue and fever.  HENT:  Negative for congestion, ear pain, sinus pressure and sore throat.   Eyes:  Negative for pain.  Respiratory:  Negative for cough, chest tightness, shortness of breath and wheezing.   Cardiovascular:  Negative for chest pain and palpitations.  Gastrointestinal:  Negative for abdominal pain, constipation, diarrhea, nausea and vomiting.  Genitourinary:  Negative for dysuria and hematuria.  Musculoskeletal:  Positive for arthralgias (multiple joints), back pain (chronic), joint swelling and myalgias.  Skin:  Negative for rash.  Neurological:  Negative for dizziness, weakness and headaches.  Psychiatric/Behavioral:  Negative for dysphoric mood. The patient is not nervous/anxious.       Objective:    Physical Exam Vitals reviewed.  Constitutional:      Appearance: Normal appearance.  Musculoskeletal:        General: Tenderness (right hip) present.  Skin:    General: Skin is warm and dry.     Capillary Refill: Capillary refill takes less than 2 seconds.  Neurological:     General: No focal deficit present.     Mental Status: She is alert and oriented to person, place, and time.  Psychiatric:        Mood and Affect: Mood normal.        Behavior: Behavior normal.    BP 122/80 (BP Location: Left Arm, Patient Position: Sitting, Cuff Size: Normal)   Pulse 93   Temp (!) 96.5 F (35.8 C) (Temporal)   Ht 5' 4" (1.626 m)   Wt 168 lb 12.8 oz (76.6 kg)   SpO2 95%   BMI 28.97 kg/m   Wt Readings from Last 3 Encounters:  12/07/20 166 lb 12.8 oz (75.7 kg)  09/26/20 168 lb (76.2 kg)  07/21/20 168 lb (76.2 kg)    Health Maintenance Due  Topic Date Due   Hepatitis C Screening  Never done   TETANUS/TDAP  Never done   Zoster Vaccines- Shingrix (1 of 2) Never done   DEXA SCAN  Never done   PNA vac Low Risk Adult (2 of 2 - PPSV23) 03/27/2018     Lab Results  Component Value Date   WBC 9.2 09/26/2020   HGB 11.7 09/26/2020   HCT 35.7 09/26/2020   MCV 92 09/26/2020   PLT 377 09/26/2020   Lab Results  Component Value Date   NA 143 09/26/2020   K 5.0 09/26/2020   CO2 21 09/26/2020   GLUCOSE 93 09/26/2020   BUN 26 09/26/2020     CREATININE 1.81 (H) 09/26/2020   BILITOT 0.2 09/26/2020   ALKPHOS 94 09/26/2020   AST 16 09/26/2020   ALT 19 09/26/2020   PROT 6.3 09/26/2020   ALBUMIN 4.0 09/26/2020   CALCIUM 9.1 09/26/2020   ANIONGAP 5 06/26/2016   EGFR 28 (L) 09/26/2020   Lab Results  Component Value Date   CHOL 144 09/26/2020   Lab Results  Component Value Date   HDL 54 09/26/2020   Lab Results  Component Value Date   LDLCALC 73 09/26/2020   Lab Results  Component Value Date   TRIG 90 09/26/2020   Lab Results  Component Value Date   CHOLHDL 2.7 09/26/2020          Assessment & Plan:   1. Primary osteoarthritis of right hip - Ambulatory referral to Orthopedic Surgery - diclofenac Sodium (VOLTAREN) 1 % GEL; Apply 4 g topically 4 (four) times daily.  Dispense: 4 g; Refill: 0 -continue walking -continue Tylenol as directed  2. Right hip pain - Ambulatory referral to Orthopedic Surgery - diclofenac Sodium (VOLTAREN) 1 % GEL; Apply 4 g topically 4 (four) times daily.  Dispense: 4 g; Refill: 0      Apply Voltaren gel to right hip 4 times a day as needed for pain Contact [facility] for follow-up of right hip  Continue Tylenol as directed Continue walking daily Follow-up as needed   I,Lauren M Auman,acting as a Education administrator for [employer], NP.,have documented all relevant documentation on the behalf of Rip Harbour, NP,as directed by  Rip Harbour, NP while in the presence of Rip Harbour, NP.    I, Rip Harbour, NP, have reviewed all documentation for this visit. The documentation on 12/16/20 for the exam, diagnosis, procedures, and orders are all accurate and complete.     Follow-up: As needed  Signed,  Jerrell Belfast, DNP

## 2020-12-16 ENCOUNTER — Encounter: Payer: Self-pay | Admitting: Nurse Practitioner

## 2020-12-16 ENCOUNTER — Ambulatory Visit (INDEPENDENT_AMBULATORY_CARE_PROVIDER_SITE_OTHER): Payer: Medicare Other | Admitting: Nurse Practitioner

## 2020-12-16 ENCOUNTER — Other Ambulatory Visit: Payer: Self-pay

## 2020-12-16 VITALS — BP 122/80 | HR 93 | Temp 96.5°F | Ht 64.0 in | Wt 168.8 lb

## 2020-12-16 DIAGNOSIS — M25551 Pain in right hip: Secondary | ICD-10-CM | POA: Diagnosis not present

## 2020-12-16 DIAGNOSIS — M1611 Unilateral primary osteoarthritis, right hip: Secondary | ICD-10-CM

## 2020-12-16 MED ORDER — DICLOFENAC SODIUM 1 % EX GEL
4.0000 g | Freq: Four times a day (QID) | CUTANEOUS | 0 refills | Status: DC
Start: 2020-12-16 — End: 2021-09-21

## 2020-12-16 NOTE — Patient Instructions (Signed)
Apply Voltaren gel to right hip 4 times a day as needed for pain Contact [facility] for follow-up of right hip  Continue Tylenol as directed Continue walking daily Follow-up as needed Hip Pain The hip is the joint between the upper legs and the lower pelvis. The bones, cartilage, tendons, and muscles of your hip joint support your body and allowyou to move around. Hip pain can range from a minor ache to severe pain in one or both of your hips. The pain may be felt on the inside of the hip joint near the groin, or on the outside near the buttocks and upper thigh. You may also have swelling orstiffness in your hip area. Follow these instructions at home: Managing pain, stiffness, and swelling     If directed, put ice on the painful area. To do this: Put ice in a plastic bag. Place a towel between your skin and the bag. Leave the ice on for 20 minutes, 2-3 times a day. If directed, apply heat to the affected area as often as told by your health care provider. Use the heat source that your health care provider recommends, such as a moist heat pack or a heating pad. Place a towel between your skin and the heat source. Leave the heat on for 20-30 minutes. Remove the heat if your skin turns bright red. This is especially important if you are unable to feel pain, heat, or cold. You may have a greater risk of getting burned. Activity Do exercises as told by your health care provider. Avoid activities that cause pain. General instructions  Take over-the-counter and prescription medicines only as told by your health care provider. Keep a journal of your symptoms. Write down: How often you have hip pain. The location of your pain. What the pain feels like. What makes the pain worse. Sleep with a pillow between your legs on your most comfortable side. Keep all follow-up visits as told by your health care provider. This is important.  Contact a health care provider if: You cannot put weight  on your leg. Your pain or swelling continues or gets worse after one week. It gets harder to walk. You have a fever. Get help right away if: You fall. You have a sudden increase in pain and swelling in your hip. Your hip is red or swollen or very tender to touch. Summary Hip pain can range from a minor ache to severe pain in one or both of your hips. The pain may be felt on the inside of the hip joint near the groin, or on the outside near the buttocks and upper thigh. Avoid activities that cause pain. Write down how often you have hip pain, the location of the pain, what makes it worse, and what it feels like. This information is not intended to replace advice given to you by your health care provider. Make sure you discuss any questions you have with your healthcare provider. Document Revised: 10/13/2018 Document Reviewed: 10/13/2018 Elsevier Patient Education  [employer].

## 2020-12-20 ENCOUNTER — Telehealth: Payer: Self-pay

## 2020-12-20 NOTE — Progress Notes (Signed)
    Chronic Care Management Pharmacy Assistant   Name: Alexandra [name]  MRN: [id number] DOB: [birth year]   Reason for Encounter: Patient Assistance Application Follow-Up- Breztri     Medications: Outpatient Encounter Medications as of 12/20/2020  Medication Sig   amLODipine (NORVASC) 10 MG tablet Take 1 tablet (10 mg total) by mouth every morning.   Budeson-Glycopyrrol-Formoterol (BREZTRI AEROSPHERE) 160-9-4.8 MCG/ACT AERO Inhale 2 puffs into the lungs in the morning and at bedtime.   diclofenac Sodium (VOLTAREN) 1 % GEL Apply 4 g topically 4 (four) times daily.   esomeprazole (NEXIUM) 40 MG capsule Take 40 mg by mouth daily as needed.    rOPINIRole (REQUIP) 2 MG tablet Take 1 tablet (2 mg total) by mouth in the morning and at bedtime.   simvastatin (ZOCOR) 40 MG tablet TAKE 1 TABLET BY MOUTH EVERY DAY   No facility-administered encounter medications on file as of 12/20/2020.    Contacted [place] [employer] to obtain status of Breztri delivery. Representative states there was an attempt to delivery it 12/10/20 but [employer] was unable to deliver. This was the 2nd attempt to deliver to patient's home without success. She will now process it to be delivered to [facility]. Patient was made aware. Medication should be delivered to practice in 7-10 days.    Sherre Poot, CPP notified  Margaretmary Dys, [place] [facility] Assistant [phone]

## 2021-01-11 DIAGNOSIS — R0902 Hypoxemia: Secondary | ICD-10-CM | POA: Diagnosis not present

## 2021-01-11 DIAGNOSIS — R112 Nausea with vomiting, unspecified: Secondary | ICD-10-CM | POA: Diagnosis not present

## 2021-01-11 DIAGNOSIS — N289 Disorder of kidney and ureter, unspecified: Secondary | ICD-10-CM | POA: Diagnosis not present

## 2021-01-11 DIAGNOSIS — Z85528 Personal history of other malignant neoplasm of kidney: Secondary | ICD-10-CM | POA: Diagnosis not present

## 2021-01-11 DIAGNOSIS — R7989 Other specified abnormal findings of blood chemistry: Secondary | ICD-10-CM | POA: Diagnosis not present

## 2021-01-11 DIAGNOSIS — Z90710 Acquired absence of both cervix and uterus: Secondary | ICD-10-CM | POA: Diagnosis not present

## 2021-01-11 DIAGNOSIS — K7689 Other specified diseases of liver: Secondary | ICD-10-CM | POA: Diagnosis not present

## 2021-01-11 DIAGNOSIS — R109 Unspecified abdominal pain: Secondary | ICD-10-CM | POA: Diagnosis not present

## 2021-01-11 DIAGNOSIS — R1084 Generalized abdominal pain: Secondary | ICD-10-CM | POA: Diagnosis not present

## 2021-01-11 DIAGNOSIS — Z5329 Procedure and treatment not carried out because of patient's decision for other reasons: Secondary | ICD-10-CM | POA: Diagnosis not present

## 2021-01-11 DIAGNOSIS — K810 Acute cholecystitis: Secondary | ICD-10-CM | POA: Diagnosis not present

## 2021-01-11 DIAGNOSIS — R7401 Elevation of levels of liver transaminase levels: Secondary | ICD-10-CM | POA: Diagnosis not present

## 2021-01-11 DIAGNOSIS — Z87891 Personal history of nicotine dependence: Secondary | ICD-10-CM | POA: Diagnosis not present

## 2021-01-11 DIAGNOSIS — K5730 Diverticulosis of large intestine without perforation or abscess without bleeding: Secondary | ICD-10-CM | POA: Diagnosis not present

## 2021-01-11 DIAGNOSIS — I10 Essential (primary) hypertension: Secondary | ICD-10-CM | POA: Diagnosis not present

## 2021-01-11 DIAGNOSIS — R1011 Right upper quadrant pain: Secondary | ICD-10-CM | POA: Diagnosis not present

## 2021-01-11 DIAGNOSIS — N281 Cyst of kidney, acquired: Secondary | ICD-10-CM | POA: Diagnosis not present

## 2021-01-11 DIAGNOSIS — K862 Cyst of pancreas: Secondary | ICD-10-CM | POA: Diagnosis not present

## 2021-01-11 DIAGNOSIS — K829 Disease of gallbladder, unspecified: Secondary | ICD-10-CM | POA: Diagnosis not present

## 2021-01-11 DIAGNOSIS — Z905 Acquired absence of kidney: Secondary | ICD-10-CM | POA: Diagnosis not present

## 2021-01-11 DIAGNOSIS — K828 Other specified diseases of gallbladder: Secondary | ICD-10-CM | POA: Diagnosis not present

## 2021-01-12 ENCOUNTER — Ambulatory Visit: Payer: Medicare Other | Admitting: Legal Medicine

## 2021-01-12 DIAGNOSIS — N183 Chronic kidney disease, stage 3 unspecified: Secondary | ICD-10-CM | POA: Diagnosis present

## 2021-01-12 DIAGNOSIS — R600 Localized edema: Secondary | ICD-10-CM | POA: Diagnosis present

## 2021-01-12 DIAGNOSIS — R7989 Other specified abnormal findings of blood chemistry: Secondary | ICD-10-CM | POA: Diagnosis not present

## 2021-01-12 DIAGNOSIS — Z87891 Personal history of nicotine dependence: Secondary | ICD-10-CM | POA: Diagnosis not present

## 2021-01-12 DIAGNOSIS — D631 Anemia in chronic kidney disease: Secondary | ICD-10-CM | POA: Diagnosis present

## 2021-01-12 DIAGNOSIS — K7689 Other specified diseases of liver: Secondary | ICD-10-CM | POA: Diagnosis not present

## 2021-01-12 DIAGNOSIS — K810 Acute cholecystitis: Secondary | ICD-10-CM | POA: Diagnosis not present

## 2021-01-12 DIAGNOSIS — R112 Nausea with vomiting, unspecified: Secondary | ICD-10-CM | POA: Diagnosis not present

## 2021-01-12 DIAGNOSIS — K8065 Calculus of gallbladder and bile duct with chronic cholecystitis with obstruction: Secondary | ICD-10-CM | POA: Diagnosis not present

## 2021-01-12 DIAGNOSIS — K819 Cholecystitis, unspecified: Secondary | ICD-10-CM | POA: Diagnosis not present

## 2021-01-12 DIAGNOSIS — Z79899 Other long term (current) drug therapy: Secondary | ICD-10-CM | POA: Diagnosis not present

## 2021-01-12 DIAGNOSIS — A419 Sepsis, unspecified organism: Secondary | ICD-10-CM | POA: Diagnosis present

## 2021-01-12 DIAGNOSIS — K828 Other specified diseases of gallbladder: Secondary | ICD-10-CM | POA: Diagnosis not present

## 2021-01-12 DIAGNOSIS — K829 Disease of gallbladder, unspecified: Secondary | ICD-10-CM | POA: Diagnosis not present

## 2021-01-12 DIAGNOSIS — E7800 Pure hypercholesterolemia, unspecified: Secondary | ICD-10-CM | POA: Diagnosis present

## 2021-01-12 DIAGNOSIS — J449 Chronic obstructive pulmonary disease, unspecified: Secondary | ICD-10-CM | POA: Diagnosis present

## 2021-01-12 DIAGNOSIS — K5730 Diverticulosis of large intestine without perforation or abscess without bleeding: Secondary | ICD-10-CM | POA: Diagnosis not present

## 2021-01-12 DIAGNOSIS — R109 Unspecified abdominal pain: Secondary | ICD-10-CM | POA: Diagnosis not present

## 2021-01-12 DIAGNOSIS — R1011 Right upper quadrant pain: Secondary | ICD-10-CM | POA: Diagnosis not present

## 2021-01-12 DIAGNOSIS — Z792 Long term (current) use of antibiotics: Secondary | ICD-10-CM | POA: Diagnosis not present

## 2021-01-12 DIAGNOSIS — I10 Essential (primary) hypertension: Secondary | ICD-10-CM | POA: Diagnosis not present

## 2021-01-12 DIAGNOSIS — K8063 Calculus of gallbladder and bile duct with acute cholecystitis with obstruction: Secondary | ICD-10-CM | POA: Diagnosis present

## 2021-01-12 DIAGNOSIS — Z905 Acquired absence of kidney: Secondary | ICD-10-CM | POA: Diagnosis not present

## 2021-01-12 DIAGNOSIS — K8012 Calculus of gallbladder with acute and chronic cholecystitis without obstruction: Secondary | ICD-10-CM | POA: Diagnosis not present

## 2021-01-12 DIAGNOSIS — G2581 Restless legs syndrome: Secondary | ICD-10-CM | POA: Diagnosis present

## 2021-01-12 DIAGNOSIS — I129 Hypertensive chronic kidney disease with stage 1 through stage 4 chronic kidney disease, or unspecified chronic kidney disease: Secondary | ICD-10-CM | POA: Diagnosis present

## 2021-01-12 DIAGNOSIS — R7401 Elevation of levels of liver transaminase levels: Secondary | ICD-10-CM | POA: Diagnosis not present

## 2021-01-12 DIAGNOSIS — K862 Cyst of pancreas: Secondary | ICD-10-CM | POA: Diagnosis not present

## 2021-01-12 DIAGNOSIS — M1990 Unspecified osteoarthritis, unspecified site: Secondary | ICD-10-CM | POA: Diagnosis present

## 2021-01-12 DIAGNOSIS — N289 Disorder of kidney and ureter, unspecified: Secondary | ICD-10-CM | POA: Diagnosis not present

## 2021-01-12 DIAGNOSIS — Z85528 Personal history of other malignant neoplasm of kidney: Secondary | ICD-10-CM | POA: Diagnosis not present

## 2021-01-12 DIAGNOSIS — Z20822 Contact with and (suspected) exposure to covid-19: Secondary | ICD-10-CM | POA: Diagnosis present

## 2021-01-12 DIAGNOSIS — Z91041 Radiographic dye allergy status: Secondary | ICD-10-CM | POA: Diagnosis not present

## 2021-01-12 DIAGNOSIS — N281 Cyst of kidney, acquired: Secondary | ICD-10-CM | POA: Diagnosis not present

## 2021-01-12 DIAGNOSIS — Z90710 Acquired absence of both cervix and uterus: Secondary | ICD-10-CM | POA: Diagnosis not present

## 2021-01-14 DIAGNOSIS — I129 Hypertensive chronic kidney disease with stage 1 through stage 4 chronic kidney disease, or unspecified chronic kidney disease: Secondary | ICD-10-CM | POA: Diagnosis not present

## 2021-01-14 DIAGNOSIS — K8012 Calculus of gallbladder with acute and chronic cholecystitis without obstruction: Secondary | ICD-10-CM | POA: Diagnosis not present

## 2021-01-14 DIAGNOSIS — N183 Chronic kidney disease, stage 3 unspecified: Secondary | ICD-10-CM | POA: Diagnosis not present

## 2021-01-16 DIAGNOSIS — K810 Acute cholecystitis: Secondary | ICD-10-CM | POA: Diagnosis not present

## 2021-01-18 ENCOUNTER — Other Ambulatory Visit: Payer: Self-pay

## 2021-01-18 ENCOUNTER — Ambulatory Visit (INDEPENDENT_AMBULATORY_CARE_PROVIDER_SITE_OTHER): Payer: Medicare Other | Admitting: Legal Medicine

## 2021-01-18 ENCOUNTER — Encounter: Payer: Self-pay | Admitting: Legal Medicine

## 2021-01-18 DIAGNOSIS — K819 Cholecystitis, unspecified: Secondary | ICD-10-CM | POA: Diagnosis not present

## 2021-01-18 MED ORDER — OXYCODONE HCL 5 MG PO TABS: ORAL_TABLET | ORAL | 0 refills | Status: DC

## 2021-01-18 NOTE — Progress Notes (Signed)
Established Patient Office Visit  Subjective:  Patient ID: Alexandra [name], female    DOB: 1941-01-02  Age: 80 y.o. MRN: [id number]  CC:  Chief Complaint  Patient presents with   Acute cholecystitis   Transitions Of Care    Patient was admitted on 01/12/2021 to 01/15/2021 at [place] presents for HPI: reconciliation of medicines and transition of care   [facility] cholecystitis with laparoscopic cholecystectomy and sepsis and discharged 01/15/2021. She is on amoxicillin and oxycodone PRN. She is on beef broth only.  No BM.  We discussed.  She is abe to ambulate.   Past Medical History:  Diagnosis Date   Restless legs syndrome     Past Surgical History:  Procedure Laterality Date   ABDOMINAL HYSTERECTOMY     complete   BACK SURGERY  2016   lower L 4 to L 5   bladder tack and uterus lift  10/10/2015   IR RADIOLOGIST EVAL & MGMT  12/03/2018   IR RADIOLOGIST EVAL & MGMT  12/31/2018   left nephrectomy  1999   PARATHYROIDECTOMY Left 07/02/2016   Procedure: LEFT INFERIOR PARATHYROIDECTOMY;  Surgeon: Armandina Gemma, MD;  Location: [facility];  Service: General;  Laterality: Left;   SHOULDER OPEN ROTATOR CUFF REPAIR  12/2019    Family History  Problem Relation Age of Onset   Emphysema Mother     Social History   Socioeconomic History   Marital status: Married    Spouse name: Not on file   Number of children: Not on file   Years of education: Not on file   Highest education level: Not on file  Occupational History   Not on file  Tobacco Use   Smoking status: Former    Packs/day: 1.00    Years: 50.00    Pack years: 50.00    Types: Cigarettes    Quit date: 06/11/2005    Years since quitting: 15.6   Smokeless tobacco: Never  Vaping Use   Vaping Use: Never used  Substance and Sexual Activity   Alcohol use: No   Drug use: No   Sexual activity: Yes    Partners: Male  Other Topics Concern   Not on file   Social History Narrative   Not on file   Social Determinants of Health   Financial Resource Strain: Not on file  Food Insecurity: Not on file  Transportation Needs: No Transportation Needs   Lack of Transportation (Medical): No   Lack of Transportation (Non-Medical): No  Physical Activity: Not on file  Stress: Not on file  Social Connections: Not on file  Intimate Partner Violence: Not on file    Outpatient Medications Prior to Visit  Medication Sig Dispense Refill   amLODipine (NORVASC) 10 MG tablet Take 1 tablet (10 mg total) by mouth every morning. 90 tablet 2   amoxicillin-clavulanate (AUGMENTIN) 875-125 MG tablet Take 1 tablet by mouth 2 (two) times daily.     Budeson-Glycopyrrol-Formoterol (BREZTRI AEROSPHERE) 160-9-4.8 MCG/ACT AERO Inhale 2 puffs into the lungs in the morning and at bedtime. 10.7 g 6   diclofenac Sodium (VOLTAREN) 1 % GEL Apply 4 g topically 4 (four) times daily. 4 g 0   esomeprazole (NEXIUM) 40 MG capsule Take 40 mg by mouth daily as needed.      rOPINIRole (REQUIP) 2 MG tablet Take 1 tablet (2 mg total) by mouth in the morning and at bedtime. 180 tablet 6  simvastatin (ZOCOR) 40 MG tablet TAKE 1 TABLET BY MOUTH EVERY DAY 90 tablet 2   oxyCODONE (OXY IR/ROXICODONE) 5 MG immediate release tablet SMARTSIG:5 Milligram(s) By Mouth Every 8 Hours PRN     No facility-administered medications prior to visit.    Allergies  Allergen Reactions   Contrast Media [Iodinated Diagnostic Agents] Hives, Itching and Rash   Shellfish Allergy Nausea And Vomiting   Shellfish-Derived Products Nausea And Vomiting    ROS Review of Systems  Constitutional:  Negative for activity change and appetite change.  HENT:  Negative for congestion.   Eyes:  Negative for visual disturbance.  Respiratory:  Negative for chest tightness and shortness of breath.   Cardiovascular:  Negative for chest pain and palpitations.  Gastrointestinal:  Positive for abdominal pain and  constipation.  Endocrine: Negative for polyuria.  Genitourinary:  Negative for difficulty urinating and dysuria.  Musculoskeletal:  Negative for arthralgias and back pain.  Neurological: Negative.   Psychiatric/Behavioral: Negative.       Objective:    Physical Exam Vitals reviewed.  Constitutional:      General: She is in acute distress.     Appearance: Normal appearance.  HENT:     Head: Normocephalic.     Right Ear: Tympanic membrane, ear canal and external ear normal.     Left Ear: Tympanic membrane, ear canal and external ear normal.     Mouth/Throat:     Mouth: Mucous membranes are dry.  Eyes:     Extraocular Movements: Extraocular movements intact.     Conjunctiva/sclera: Conjunctivae normal.     Pupils: Pupils are equal, round, and reactive to light.  Cardiovascular:     Rate and Rhythm: Normal rate and regular rhythm.     Pulses: Normal pulses.     Heart sounds: Normal heart sounds. No murmur heard.   No gallop.  Pulmonary:     Effort: Pulmonary effort is normal. No respiratory distress.     Breath sounds: Normal breath sounds. No wheezing.  Abdominal:     General: Abdomen is flat. Bowel sounds are normal. There is no distension.     Tenderness: There is no abdominal tenderness.     Comments: Steri strips in place, no infection at puncture sites  Musculoskeletal:        General: Normal range of motion.     Cervical back: Normal range of motion.     Right lower leg: No edema.     Left lower leg: No edema.  Skin:    General: Skin is warm.     Capillary Refill: Capillary refill takes less than 2 seconds.  Neurological:     General: No focal deficit present.     Mental Status: She is alert and oriented to person, place, and time. Mental status is at baseline.    BP 140/80   Pulse (!) 101   Temp (!) 97.5 F (36.4 C)   Resp 17   Ht _0  (1.626 m)   Wt 172 lb (78 kg)   SpO2 94%   BMI 29.52 kg/m   Wt Readings from Last 3 Encounters:  01/18/21 172 lb (78  kg)  12/16/20 168 lb 12.8 oz (76.6 kg)  12/07/20 166 lb 12.8 oz (75.7 kg)     Health Maintenance Due  Topic Date Due   Hepatitis C Screening  Never done   TETANUS/TDAP  Never done   Zoster Vaccines- Shingrix (1 of 2) Never done   DEXA SCAN  Never done  PNA vac Low Risk Adult (2 of 2 - PPSV23) 03/27/2018   INFLUENZA VACCINE  01/09/2021    There are no preventive care reminders to display for this patient.  No results found for: TSH Lab Results  Component Value Date   WBC 9.2 09/26/2020   HGB 11.7 09/26/2020   HCT 35.7 09/26/2020   MCV 92 09/26/2020   PLT 377 09/26/2020   Lab Results  Component Value Date   NA 143 09/26/2020   K 5.0 09/26/2020   CO2 21 09/26/2020   GLUCOSE 93 09/26/2020   BUN 26 09/26/2020   CREATININE 1.81 (H) 09/26/2020   BILITOT 0.2 09/26/2020   ALKPHOS 94 09/26/2020   AST 16 09/26/2020   ALT 19 09/26/2020   PROT 6.3 09/26/2020   ALBUMIN 4.0 09/26/2020   CALCIUM 9.1 09/26/2020   ANIONGAP 5 06/26/2016   EGFR 28 (L) 09/26/2020   Lab Results  Component Value Date   CHOL 144 09/26/2020   Lab Results  Component Value Date   HDL 54 09/26/2020   Lab Results  Component Value Date   LDLCALC 73 09/26/2020   Lab Results  Component Value Date   TRIG 90 09/26/2020   Lab Results  Component Value Date   CHOLHDL 2.7 09/26/2020   No results found for: HGBA1C    Assessment & Plan:   Diagnoses and all orders for this visit: Cholecystitis -     Comprehensive metabolic panel -     oxyCODONE (OXY IR/ROXICODONE) 5 MG immediate release tablet; SMARTSIG:5 Milligram(s) By Mouth Every 8 Hours PRN  Patient dong weil after her laparoscopic cholecystectomy   Follow-up: Return at next chronic visit.    Reinaldo Meeker, MD

## 2021-01-19 LAB — COMPREHENSIVE METABOLIC PANEL
ALT: 122 IU/L — ABNORMAL HIGH (ref 0–32)
AST: 30 IU/L (ref 0–40)
Albumin/Globulin Ratio: 1.3 (ref 1.2–2.2)
Albumin: 3.5 g/dL — ABNORMAL LOW (ref 3.7–4.7)
Alkaline Phosphatase: 151 IU/L — ABNORMAL HIGH (ref 44–121)
BUN/Creatinine Ratio: 10 — ABNORMAL LOW (ref 12–28)
BUN: 19 mg/dL (ref 8–27)
Bilirubin Total: 0.3 mg/dL (ref 0.0–1.2)
CO2: 17 mmol/L — ABNORMAL LOW (ref 20–29)
Calcium: 9.7 mg/dL (ref 8.7–10.3)
Chloride: 105 mmol/L (ref 96–106)
Creatinine, Ser: 1.97 mg/dL — ABNORMAL HIGH (ref 0.57–1.00)
Globulin, Total: 2.6 g/dL (ref 1.5–4.5)
Glucose: 119 mg/dL — ABNORMAL HIGH (ref 65–99)
Potassium: 4.5 mmol/L (ref 3.5–5.2)
Sodium: 142 mmol/L (ref 134–144)
Total Protein: 6.1 g/dL (ref 6.0–8.5)
eGFR: 26 mL/min/{1.73_m2} — ABNORMAL LOW (ref >59)

## 2021-01-19 NOTE — Progress Notes (Signed)
Glucose 119, kidneys remain stage 4 severe- sees [facility], one liver tests up 122,  lp

## 2021-01-20 DIAGNOSIS — Z09 Encounter for follow-up examination after completed treatment for conditions other than malignant neoplasm: Secondary | ICD-10-CM | POA: Diagnosis not present

## 2021-01-20 DIAGNOSIS — Z9049 Acquired absence of other specified parts of digestive tract: Secondary | ICD-10-CM | POA: Diagnosis not present

## 2021-01-20 DIAGNOSIS — R7989 Other specified abnormal findings of blood chemistry: Secondary | ICD-10-CM | POA: Diagnosis not present

## 2021-01-26 DIAGNOSIS — K8689 Other specified diseases of pancreas: Secondary | ICD-10-CM | POA: Diagnosis not present

## 2021-01-26 DIAGNOSIS — K7689 Other specified diseases of liver: Secondary | ICD-10-CM | POA: Diagnosis not present

## 2021-01-26 DIAGNOSIS — K429 Umbilical hernia without obstruction or gangrene: Secondary | ICD-10-CM | POA: Diagnosis not present

## 2021-01-26 DIAGNOSIS — N281 Cyst of kidney, acquired: Secondary | ICD-10-CM | POA: Diagnosis not present

## 2021-01-26 DIAGNOSIS — R1011 Right upper quadrant pain: Secondary | ICD-10-CM | POA: Diagnosis not present

## 2021-01-26 DIAGNOSIS — Z9049 Acquired absence of other specified parts of digestive tract: Secondary | ICD-10-CM | POA: Diagnosis not present

## 2021-01-31 ENCOUNTER — Other Ambulatory Visit: Payer: Self-pay

## 2021-01-31 ENCOUNTER — Ambulatory Visit (INDEPENDENT_AMBULATORY_CARE_PROVIDER_SITE_OTHER): Payer: Medicare Other

## 2021-01-31 DIAGNOSIS — E782 Mixed hyperlipidemia: Secondary | ICD-10-CM

## 2021-01-31 DIAGNOSIS — I10 Essential (primary) hypertension: Secondary | ICD-10-CM

## 2021-01-31 NOTE — Progress Notes (Signed)
Chronic Care Management Pharmacy Note  01/31/2021 Name:  Alexandra [name] MRN:  [id number] DOB:  [birth year]  Summary: Patient reports BP well controlled 110/75 mmHg.   Has Breztri inhaler is supplied through [facility]. Pharmacy team will assist with renewing for 2023.  Trying to find out what she can eat after gall bladder surgery. Nausea when cooking currently. Pharmacist discussed option of Boost or Ensure to supplement nutrition if nausea with foods continues. .    Subjective: Alexandra [name] is an 80 y.o. year old female who is a primary patient of Henrene Pastor, Zeb Comfort, MD.  The CCM team was consulted for assistance with disease management and care coordination needs.    Engaged with patient by telephone for follow up visit in response to provider referral for pharmacy case management and/or care coordination services.   Consent to Services:  The patient was given information about Chronic Care Management services, agreed to services, and gave verbal consent prior to initiation of services.  Please see initial visit note for detailed documentation.   Patient Care Team: [name], MD as PCP - General (Family Medicine) Burnice Logan, [facility] as Pharmacist (Pharmacist)  Recent office visits: 01/18/2021 - Glucose 119, kidneys remain stage 4 severe- sees [facility], one liver tests up 122. Hospital follow-up after laparascopic gall bladder.   Recent consult visits: 01/23/2021 -post-op cholecystectomy.   Hospital visits: 08/04-08/08 - cholecystectomy.    Objective:  Lab Results  Component Value Date   CREATININE 1.97 (H) 01/18/2021   BUN 19 01/18/2021   GFRNONAA 25 (L) 03/28/2020   GFRAA 29 (L) 03/28/2020   NA 142 01/18/2021   K 4.5 01/18/2021   CALCIUM 9.7 01/18/2021   CO2 17 (L) 01/18/2021   GLUCOSE 119 (H) 01/18/2021    No results found for: HGBA1C, FRUCTOSAMINE, GFR, MICROALBUR  Last diabetic Eye exam: No results found for: HMDIABEYEEXA  Last  diabetic Foot exam: No results found for: HMDIABFOOTEX   Lab Results  Component Value Date   CHOL 144 09/26/2020   HDL 54 09/26/2020   LDLCALC 73 09/26/2020   TRIG 90 09/26/2020   CHOLHDL 2.7 09/26/2020    Hepatic Function Latest Ref Rng & Units 01/18/2021 09/26/2020 03/28/2020  Total Protein 6.0 - 8.5 g/dL 6.1 6.3 6.1  Albumin 3.7 - 4.7 g/dL 3.5(L) 4.0 3.6(L)  AST 0 - 40 IU/L _0 ALT 0 - 32 IU/L 122(H) 19 14  Alk Phosphatase 44 - 121 IU/L 151(H) 94 104  Total Bilirubin 0.0 - 1.2 mg/dL 0.3 0.2 0.2    No results found for: TSH, FREET4  CBC Latest Ref Rng & Units 09/26/2020 03/28/2020 11/24/2019  WBC 3.4 - 10.8 x10E3/uL 9.2 10.9(H) 11.6(H)  Hemoglobin 11.1 - 15.9 g/dL 11.7 11.8 12.0  Hematocrit 34.0 - 46.6 % 35.7 36.3 36.6  Platelets 150 - 450 x10E3/uL 377 432 419    No results found for: VD25OH  Clinical ASCVD: No  The 10-year ASCVD risk score Mikey Bussing DC Jr., et al., 2013) is: 33%   Values used to calculate the score:     Age: 80 years     Sex: Female     Is Non-Hispanic African American: No     Diabetic: No     Tobacco smoker: No     Systolic Blood Pressure: 948 mmHg     Is BP treated: Yes     HDL Cholesterol: 54 mg/dL     Total Cholesterol: 144 mg/dL  Depression screen [facility] 2/9 09/26/2020 08/04/2019  Decreased Interest 0 0  Down, Depressed, Hopeless 0 0  PHQ - 2 Score 0 0     Other: (CHADS2VASc if Afib, MMRC or CAT for COPD, ACT, DEXA)  Social History   Tobacco Use  Smoking Status Former   Packs/day: 1.00   Years: 50.00   Pack years: 50.00   Types: Cigarettes   Quit date: 06/11/2005   Years since quitting: 15.6  Smokeless Tobacco Never   BP Readings from Last 3 Encounters:  01/18/21 140/80  12/16/20 122/80  12/07/20 122/72   Pulse Readings from Last 3 Encounters:  01/18/21 (!) 101  12/16/20 93  12/07/20 97   Wt Readings from Last 3 Encounters:  01/18/21 172 lb (78 kg)  12/16/20 168 lb 12.8 oz (76.6 kg)  12/07/20 166 lb 12.8 oz (75.7 kg)    BMI Readings from Last 3 Encounters:  01/18/21 29.52 kg/m  12/16/20 28.97 kg/m  12/07/20 28.63 kg/m    Assessment/Interventions: Review of patient past medical history, allergies, medications, health status, including review of consultants reports, laboratory and other test data, was performed as part of comprehensive evaluation and provision of chronic care management services.   SDOH:  (Social Determinants of Health) assessments and interventions performed: Yes  SDOH Screenings   Alcohol Screen: Low Risk    Last Alcohol Screening Score (AUDIT): 0  Depression (PHQ2-9): Low Risk    PHQ-2 Score: 0  Financial Resource Strain: Not on file  Food Insecurity: Not on file  Housing: Low Risk    Last Housing Risk Score: 0  Physical Activity: Not on file  Social Connections: Not on file  Stress: Not on file  Tobacco Use: Medium Risk   Smoking Tobacco Use: Former   Smokeless Tobacco Use: Never  Transportation Needs: No Transportation Needs   Lack of Transportation (Medical): No   Lack of Transportation (Non-Medical): No    CCM Care Plan  Allergies  Allergen Reactions   Contrast Media [Iodinated Diagnostic Agents] Hives, Itching and Rash   Shellfish Allergy Nausea And Vomiting   Shellfish-Derived Products Nausea And Vomiting    Medications Reviewed Today     Reviewed by Burnice Logan, [facility] (Pharmacist) on 01/31/21 at 1121  Med List Status: <None>   Medication Order Taking? Sig Documenting Provider Last Dose Status Informant  amLODipine (NORVASC) 10 MG tablet [id number] Yes Take 1 tablet (10 mg total) by mouth every morning. [name], MD Taking Active   amoxicillin-clavulanate (AUGMENTIN) 875-125 MG tablet [id number] No Take 1 tablet by mouth 2 (two) times daily.  Patient not taking: Reported on 01/31/2021   [provider] Not Taking Active   Budeson-Glycopyrrol-Formoterol (BREZTRI AEROSPHERE) 160-9-4.8 MCG/ACT AERO [id number] Yes Inhale 2 puffs into the  lungs in the morning and at bedtime. [name], MD Taking Active   diclofenac Sodium (VOLTAREN) 1 % GEL [id number] Yes Apply 4 g topically 4 (four) times daily. Rip Harbour, NP Taking Active   esomeprazole (NEXIUM) 40 MG capsule [id number] Yes Take 40 mg by mouth daily as needed.  [provider] Taking Active   oxyCODONE (OXY IR/ROXICODONE) 5 MG immediate release tablet [id number] No SMARTSIG:5 Milligram(s) By Mouth Every 8 Hours PRN  Patient not taking: Reported on 01/31/2021   [name], MD Not Taking Active   rOPINIRole (REQUIP) 2 MG tablet [id number] Yes Take 1 tablet (2 mg total) by mouth in the morning and at bedtime. [name], MD Taking Active  simvastatin (ZOCOR) 40 MG tablet [id number] Yes TAKE 1 TABLET BY MOUTH EVERY DAY [name], MD Taking Active             Patient Active Problem List   Diagnosis Date Noted   Cholecystitis 01/18/2021   BMI 28.0-28.9,adult 12/07/2020   Trochanteric bursitis of right hip 12/07/2020   Contusion of ala nasi 03/02/2020   Contusion of both eyes 03/02/2020   Chest wall pain 03/02/2020   Female stress incontinence 08/03/2019   Mixed conductive and sensorineural hearing loss, 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   Chronic kidney disease, stage 3a ([place]) 08/03/2019   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   Bladder cancer ([place]) 08/05/2018   H/O left nephrectomy 08/05/2018   COPD, moderate ([place]) 03/20/2018   DOE (dyspnea on exertion) 01/08/2018   Ex-smoker 01/08/2018   Hyperparathyroidism, primary ([place]) 06/25/2016   Essential hypertension 02/27/2016   Hyperlipemia 02/27/2016   History of kidney cancer 02/27/2016   Age related osteoporosis 02/27/2016   Spinal stenosis 02/27/2016    Immunization History  Administered Date(s) Administered   Fluad Quad(high Dose 65+) 03/25/2019, 02/17/2020   Influenza, High Dose Seasonal PF 03/27/2017, 04/01/2018   Moderna SARS-COV2 Booster Vaccination 04/12/2020,  12/01/2020   Moderna Sars-Covid-2 Vaccination 06/26/2019, 07/24/2019   Pneumococcal Conjugate-13 10/15/2013, 03/27/2017   Pneumococcal-Unspecified 10/15/2013   Zoster, Live 11/23/2010    Conditions to be addressed/monitored:  Hypertension and Hyperlipidemia  Care Plan : [place]  Updates made by Burnice Logan, [place] since 01/31/2021 12:00 AM     Problem: htn, hld, copd   Priority: High  Onset Date: 01/31/2021     Goal: Patient-Specific Goal   Start Date: 01/31/2021  Expected End Date: 01/31/2022  This Visit's Progress: On track  Priority: High  Note:    Current Barriers:  Unable to independently afford treatment regimen  Pharmacist Clinical Goal(s):  Patient will verbalize ability to afford treatment regimen through collaboration with PharmD and provider.   Interventions: 1:1 collaboration with [name], MD regarding development and update of comprehensive plan of care as evidenced by provider attestation and co-signature Inter-disciplinary care team collaboration (see longitudinal plan of care) Comprehensive medication review performed; medication list updated in electronic medical record  Hypertension (BP goal <140/90) -Controlled -Current treatment: Amlodipine 10 mg daily am  -Medications previously tried: none reported at this time -Current home readings: reports it was 110/70s at surgeon follow-up -Current dietary habits: limited since gall bladder removal. Trying to find out what she can tolerate. Cooking makes her sick right now.  -Current exercise habits: moving around the house as she recovers.  -Denies hypotensive/hypertensive symptoms -Educated on BP goals and benefits of medications for prevention of heart attack, stroke and kidney damage; Daily salt intake goal < 2300 mg; Exercise goal of 150 minutes per week; -Counseled to monitor BP at home as needed, document, and provide log at future appointments -Recommended to continue  current medication  Hyperlipidemia: (LDL goal < 100) -Controlled -Current treatment: simvastatin 40 mg daily  -Medications previously tried: none reported  -Current dietary habits: limited since gall bladder removal. Trying to find out what she can tolerate. Cooking makes her sick right now.  -Current exercise habits: moving around the house as she recovers.  -Denies hypotensive/hypertensive symptoms -Educated on Cholesterol goals;  Benefits of statin for ASCVD risk reduction; Exercise goal of 150 minutes per week; -Counseled on diet and exercise extensively Recommended to continue current medication  Patient Goals/Self-Care Activities Patient will:  - take medications as prescribed focus  on medication adherence by using pill box target a minimum of 150 minutes of moderate intensity exercise weekly engage in dietary modifications by working to get nutrients in diet needed since gall bladder surgery.   Follow Up Plan: Telephone follow up appointment with care management team member scheduled for: 07/2021 with pharmacist        Medication Assistance:  Breztri obtained through [facility] and [place] medication assistance program.  Enrollment ends 06/10/2021  Compliance/Adherence/Medication fill history: Care Gaps: Needs AWV for 2022  Star-Rating Drugs: Simvastatin  - 90 day supply 10/07/2020 per fill history  Patient's preferred pharmacy is:  [facility] [place], [place] - [address] AT [place] [address] [place] [address] Phone: [phone] Fax: [phone]  [place], [address] [place] [place] [address] Phone: [phone] Fax: [phone]  Uses pill box? Yes Pt endorses good compliance  We discussed: Benefits of medication synchronization, packaging and delivery as well as enhanced pharmacist oversight with [employer]. Patient decided to: Continue  current medication management strategy  Care Plan and Follow Up Patient Decision:  Patient agrees to Care Plan and Follow-up.  Plan: Telephone follow up appointment with care management team member scheduled for:  07/2021 with pharmacist

## 2021-01-31 NOTE — Patient Instructions (Signed)
Visit Information   Goals Addressed             This Visit's Progress    Learn More About My Health       Timeframe:  Long-Range Goal Priority:  High Start Date:                             Expected End Date:                        Follow Up Date 07/2021    - tell my story and reason for my visit - ask questions - bring a list of my medicines to the visit    Why is this important?   The best way to learn about your health and care is by talking to the doctor and nurse.  They will answer your questions and give you information in the way that you like best.    Notes:      Track and Manage My Blood Pressure-Hypertension       Timeframe:  Long-Range Goal Priority:  High Start Date:                             Expected End Date:                       Follow Up Date 07/2021    - check blood pressure weekly - choose a place to take my blood pressure (home, clinic or office, retail store)    Why is this important?   You won't feel high blood pressure, but it can still hurt your blood vessels.  High blood pressure can cause heart or kidney problems. It can also cause a stroke.  Making lifestyle changes like losing a little weight or eating less salt will help.  Checking your blood pressure at home and at different times of the day can help to control blood pressure.  If the doctor prescribes medicine remember to take it the way the doctor ordered.  Call the office if you cannot afford the medicine or if there are questions about it.     Notes:      Track and Manage My Symptoms-COPD       Timeframe:  Long-Range Goal Priority:  High Start Date:                             Expected End Date:                       Follow Up Date 07/2021    - begin a symptom diary    Why is this important?   Tracking your symptoms and other information about your health helps your doctor plan your care.  Write down the symptoms, the time of day, what you were doing and what medicine  you are taking.  You will soon learn how to manage your symptoms.     Notes:        Patient Care Plan: CCM Pharmacy Care Plan     Problem Identified: htn, hld, copd   Priority: High  Onset Date: 01/31/2021     Goal: Patient-Specific Goal   Start Date: 01/31/2021  Expected End Date: 01/31/2022  This Visit's Progress: On track  Priority: High  Note:  Current Barriers:  Unable to independently afford treatment regimen  Pharmacist Clinical Goal(s):  Patient will verbalize ability to afford treatment regimen through collaboration with PharmD and provider.   Interventions: 1:1 collaboration with [name], MD regarding development and update of comprehensive plan of care as evidenced by provider attestation and co-signature Inter-disciplinary care team collaboration (see longitudinal plan of care) Comprehensive medication review performed; medication list updated in electronic medical record  Hypertension (BP goal <140/90) -Controlled -Current treatment: Amlodipine 10 mg daily am  -Medications previously tried: none reported at this time -Current home readings: reports it was 110/70s at surgeon follow-up -Current dietary habits: limited since gall bladder removal. Trying to find out what she can tolerate. Cooking makes her sick right now.  -Current exercise habits: moving around the house as she recovers.  -Denies hypotensive/hypertensive symptoms -Educated on BP goals and benefits of medications for prevention of heart attack, stroke and kidney damage; Daily salt intake goal < 2300 mg; Exercise goal of 150 minutes per week; -Counseled to monitor BP at home as needed, document, and provide log at future appointments -Recommended to continue current medication  Hyperlipidemia: (LDL goal < 100) -Controlled -Current treatment: simvastatin 40 mg daily  -Medications previously tried: none reported  -Current dietary habits: limited since gall bladder removal.  Trying to find out what she can tolerate. Cooking makes her sick right now.  -Current exercise habits: moving around the house as she recovers.  -Denies hypotensive/hypertensive symptoms -Educated on Cholesterol goals;  Benefits of statin for ASCVD risk reduction; Exercise goal of 150 minutes per week; -Counseled on diet and exercise extensively Recommended to continue current medication  Patient Goals/Self-Care Activities Patient will:  - take medications as prescribed focus on medication adherence by using pill box target a minimum of 150 minutes of moderate intensity exercise weekly engage in dietary modifications by working to get nutrients in diet needed since gall bladder surgery.   Follow Up Plan: Telephone follow up appointment with care management team member scheduled for: 07/2021 with pharmacist        The patient verbalized understanding of instructions, educational materials, and care plan provided today and declined offer to receive copy of patient instructions, educational materials, and care plan.  Telephone follow up appointment with pharmacy team member scheduled for: 07/2021  Burnice Logan, [facility]

## 2021-03-07 ENCOUNTER — Encounter: Payer: Self-pay | Admitting: Legal Medicine

## 2021-03-07 ENCOUNTER — Ambulatory Visit (INDEPENDENT_AMBULATORY_CARE_PROVIDER_SITE_OTHER): Payer: Medicare Other | Admitting: Legal Medicine

## 2021-03-07 VITALS — BP 128/72 | HR 88 | Temp 97.3°F | Ht 64.0 in | Wt 163.6 lb

## 2021-03-07 DIAGNOSIS — R0981 Nasal congestion: Secondary | ICD-10-CM

## 2021-03-07 DIAGNOSIS — R062 Wheezing: Secondary | ICD-10-CM | POA: Diagnosis not present

## 2021-03-07 DIAGNOSIS — R0602 Shortness of breath: Secondary | ICD-10-CM | POA: Diagnosis not present

## 2021-03-07 DIAGNOSIS — I700 Atherosclerosis of aorta: Secondary | ICD-10-CM | POA: Diagnosis not present

## 2021-03-07 DIAGNOSIS — J441 Chronic obstructive pulmonary disease with (acute) exacerbation: Secondary | ICD-10-CM | POA: Diagnosis not present

## 2021-03-07 LAB — POC COVID19 BINAXNOW: SARS Coronavirus 2 Ag: NEGATIVE

## 2021-03-07 MED ORDER — LEVOFLOXACIN 500 MG PO TABS: 500.0000 mg | ORAL_TABLET | Freq: Every day | ORAL | 0 refills | Status: DC

## 2021-03-07 MED ORDER — TRIAMCINOLONE ACETONIDE 40 MG/ML IJ SUSP
80.0000 mg | Freq: Once | INTRAMUSCULAR | Status: AC
  Administered 2021-03-07: 80 mg via INTRAMUSCULAR

## 2021-03-07 MED ORDER — CHERATUSSIN AC 100-10 MG/5ML PO SOLN
5.0000 mL | Freq: Three times a day (TID) | ORAL | 0 refills | Status: DC | PRN
Start: 2021-03-07 — End: 2021-04-04

## 2021-03-07 NOTE — Progress Notes (Signed)
Acute Office Visit  Subjective:    Patient ID: Alexandra [name], female    DOB: [birth year], 80 y.o.   MRN: [id number]  Chief Complaint  Patient presents with   Nasal Congestion    Covid test negative, all started Sunday.   Cough    Cough stated Sunday.    HPI Patient is in today for sickness, negative Covid.  Chronic cough. Coughing up black phlegm. No fever or chills.  No dyspnea.  Past Medical History:  Diagnosis Date   Restless legs syndrome     Past Surgical History:  Procedure Laterality Date   ABDOMINAL HYSTERECTOMY     complete   BACK SURGERY  2016   lower L 4 to L 5   bladder tack and uterus lift  10/10/2015   IR RADIOLOGIST EVAL & MGMT  12/03/2018   IR RADIOLOGIST EVAL & MGMT  12/31/2018   left nephrectomy  1999   PARATHYROIDECTOMY Left 07/02/2016   Procedure: LEFT INFERIOR PARATHYROIDECTOMY;  Surgeon: Armandina Gemma, MD;  Location: [facility];  Service: General;  Laterality: Left;   SHOULDER OPEN ROTATOR CUFF REPAIR  12/2019    Family History  Problem Relation Age of Onset   Emphysema Mother     Social History   Socioeconomic History   Marital status: Married    Spouse name: Not on file   Number of children: Not on file   Years of education: Not on file   Highest education level: Not on file  Occupational History   Not on file  Tobacco Use   Smoking status: Former    Packs/day: 1.00    Years: 50.00    Pack years: 50.00    Types: Cigarettes    Quit date: 06/11/2005    Years since quitting: 15.7   Smokeless tobacco: Never  Vaping Use   Vaping Use: Never used  Substance and Sexual Activity   Alcohol use: No   Drug use: No   Sexual activity: Yes    Partners: Male  Other Topics Concern   Not on file  Social History Narrative   Not on file   Social Determinants of Health   Financial Resource Strain: Not on file  Food Insecurity: Not on file  Transportation Needs: No Transportation Needs   Lack of Transportation (Medical): No   Lack of  Transportation (Non-Medical): No  Physical Activity: Not on file  Stress: Not on file  Social Connections: Not on file  Intimate Partner Violence: Not on file    Outpatient Medications Prior to Visit  Medication Sig Dispense Refill   amLODipine (NORVASC) 10 MG tablet Take 1 tablet (10 mg total) by mouth every morning. 90 tablet 2   amoxicillin-clavulanate (AUGMENTIN) 875-125 MG tablet Take 1 tablet by mouth 2 (two) times daily.     Budeson-Glycopyrrol-Formoterol (BREZTRI AEROSPHERE) 160-9-4.8 MCG/ACT AERO Inhale 2 puffs into the lungs in the morning and at bedtime. 10.7 g 6   diclofenac Sodium (VOLTAREN) 1 % GEL Apply 4 g topically 4 (four) times daily. 4 g 0   esomeprazole (NEXIUM) 40 MG capsule Take 40 mg by mouth daily as needed.      oxyCODONE (OXY IR/ROXICODONE) 5 MG immediate release tablet SMARTSIG:5 Milligram(s) By Mouth Every 8 Hours PRN 30 tablet 0   rOPINIRole (REQUIP) 2 MG tablet Take 1 tablet (2 mg total) by mouth in the morning and at bedtime. 180 tablet 6   simvastatin (ZOCOR) 40 MG tablet TAKE 1 TABLET BY MOUTH EVERY DAY 90  tablet 2   No facility-administered medications prior to visit.    Allergies  Allergen Reactions   Contrast Media [Iodinated Diagnostic Agents] Hives, Itching and Rash   Shellfish Allergy Nausea And Vomiting   Shellfish-Derived Products Nausea And Vomiting    Review of Systems  Constitutional:  Negative for activity change and appetite change.  Eyes:  Negative for visual disturbance.  Respiratory:  Positive for shortness of breath and wheezing.   Cardiovascular:  Negative for chest pain and palpitations.  Gastrointestinal:  Negative for abdominal distention.  Genitourinary: Negative.   Musculoskeletal:  Negative for arthralgias and back pain.  Neurological: Negative.       Objective:    Physical Exam Vitals reviewed.  Constitutional:      General: She is in acute distress.     Appearance: Normal appearance.  HENT:     Right Ear:  Tympanic membrane normal.     Left Ear: Tympanic membrane normal.     Nose: Nose normal.     Mouth/Throat:     Mouth: Mucous membranes are moist.  Eyes:     Extraocular Movements: Extraocular movements intact.     Conjunctiva/sclera: Conjunctivae normal.     Pupils: Pupils are equal, round, and reactive to light.  Cardiovascular:     Rate and Rhythm: Normal rate and regular rhythm.     Pulses: Normal pulses.     Heart sounds: Normal heart sounds. No murmur heard.   No gallop.  Pulmonary:     Breath sounds: Rhonchi present. No wheezing.  Abdominal:     General: Abdomen is flat. Bowel sounds are normal. There is no distension.     Palpations: Abdomen is soft.     Tenderness: There is no abdominal tenderness.  Musculoskeletal:        General: Normal range of motion.  Skin:    General: Skin is warm.  Neurological:     General: No focal deficit present.     Mental Status: She is alert and oriented to person, place, and time.    BP 128/72   Pulse 88   Temp (!) 97.3 F (36.3 C)   Ht '5\' 4"'  (1.626 m)   Wt 163 lb 9.6 oz (74.2 kg)   SpO2 94%   BMI 28.08 kg/m  Wt Readings from Last 3 Encounters:  03/07/21 163 lb 9.6 oz (74.2 kg)  01/18/21 172 lb (78 kg)  12/16/20 168 lb 12.8 oz (76.6 kg)    Health Maintenance Due  Topic Date Due   Hepatitis C Screening  Never done   TETANUS/TDAP  Never done   Zoster Vaccines- Shingrix (1 of 2) Never done   DEXA SCAN  Never done   INFLUENZA VACCINE  01/09/2021    There are no preventive care reminders to display for this patient.   No results found for: TSH Lab Results  Component Value Date   WBC 9.2 09/26/2020   HGB 11.7 09/26/2020   HCT 35.7 09/26/2020   MCV 92 09/26/2020   PLT 377 09/26/2020   Lab Results  Component Value Date   NA 142 01/18/2021   K 4.5 01/18/2021   CO2 17 (L) 01/18/2021   GLUCOSE 119 (H) 01/18/2021   BUN 19 01/18/2021   CREATININE 1.97 (H) 01/18/2021   BILITOT 0.3 01/18/2021   ALKPHOS 151 (H)  01/18/2021   AST 30 01/18/2021   ALT 122 (H) 01/18/2021   PROT 6.1 01/18/2021   ALBUMIN 3.5 (L) 01/18/2021   CALCIUM 9.7 01/18/2021  ANIONGAP 5 06/26/2016   EGFR 26 (L) 01/18/2021   Lab Results  Component Value Date   CHOL 144 09/26/2020   Lab Results  Component Value Date   HDL 54 09/26/2020   Lab Results  Component Value Date   LDLCALC 73 09/26/2020   Lab Results  Component Value Date   TRIG 90 09/26/2020   Lab Results  Component Value Date   CHOLHDL 2.7 09/26/2020   No results found for: HGBA1C     Assessment & Plan:  1. Nasal congestion - POC COVID-19 Negative Covid 19  2. Obstructive chronic bronchitis with exacerbation (HCC) - DG Chest 2 View - levofloxacin (LEVAQUIN) 500 MG tablet; Take 1 tablet (500 mg total) by mouth daily.  Dispense: 7 tablet; Refill: 0 - triamcinolone acetonide (KENALOG-40) injection 80 mg - guaiFENesin-codeine (CHERATUSSIN AC) 100-10 MG/5ML syrup; Take 5 mLs by mouth 3 (three) times daily as needed.  Dispense: 120 mL; Refill: 0 Patient haw COPD with exacerbation, treat with antibiotics and kenalog.    Meds ordered this encounter  Medications   levofloxacin (LEVAQUIN) 500 MG tablet    Sig: Take 1 tablet (500 mg total) by mouth daily.    Dispense:  7 tablet    Refill:  0   triamcinolone acetonide (KENALOG-40) injection 80 mg   guaiFENesin-codeine (CHERATUSSIN AC) 100-10 MG/5ML syrup    Sig: Take 5 mLs by mouth 3 (three) times daily as needed.    Dispense:  120 mL    Refill:  0    Orders Placed This Encounter  Procedures   DG Chest 2 View   POC COVID-19     I spent 20 minutes dedicated to the care of this patient on the date of this encounter to include face-to-face time with the patient, as well as:   Follow-up: Return if symptoms worsen or fail to improve.  An After Visit Summary was printed and given to the patient.  Reinaldo Meeker, MD [facility] [phone]

## 2021-03-11 ENCOUNTER — Other Ambulatory Visit: Payer: Self-pay | Admitting: Legal Medicine

## 2021-03-12 ENCOUNTER — Other Ambulatory Visit: Payer: Self-pay | Admitting: Legal Medicine

## 2021-03-12 DIAGNOSIS — I10 Essential (primary) hypertension: Secondary | ICD-10-CM

## 2021-03-20 DIAGNOSIS — Z20822 Contact with and (suspected) exposure to covid-19: Secondary | ICD-10-CM | POA: Diagnosis not present

## 2021-03-28 DIAGNOSIS — N2889 Other specified disorders of kidney and ureter: Secondary | ICD-10-CM | POA: Diagnosis not present

## 2021-03-28 DIAGNOSIS — C672 Malignant neoplasm of lateral wall of bladder: Secondary | ICD-10-CM | POA: Diagnosis not present

## 2021-03-31 DIAGNOSIS — Z20822 Contact with and (suspected) exposure to covid-19: Secondary | ICD-10-CM | POA: Diagnosis not present

## 2021-04-03 ENCOUNTER — Encounter: Payer: Self-pay | Admitting: Legal Medicine

## 2021-04-03 ENCOUNTER — Telehealth: Payer: Self-pay

## 2021-04-03 NOTE — Chronic Care Management (AMB) (Signed)
    Chronic Care Management Pharmacy Assistant   Name: Alexandra [name]  MRN: [id number] DOB: 1940-09-16   Reason for Encounter: Disease State call for COPD    Recent office visits:  03/07/21 Reinaldo Meeker MD. Seen for Nasal Congestion. Started on Guaifenesin-Codeine 100-10 mg 5/ml 3 times daily prn. Started on Levofloxacin 500 mg daily.  Recent consult visits:  None since 01/31/21  Hospital visits:  None since 01/31/21  Medications: Outpatient Encounter Medications as of 04/03/2021  Medication Sig   amLODipine (NORVASC) 10 MG tablet TAKE 1 TABLET(10 MG) BY MOUTH EVERY MORNING   amoxicillin-clavulanate (AUGMENTIN) 875-125 MG tablet Take 1 tablet by mouth 2 (two) times daily.   Budeson-Glycopyrrol-Formoterol (BREZTRI AEROSPHERE) 160-9-4.8 MCG/ACT AERO Inhale 2 puffs into the lungs in the morning and at bedtime.   diclofenac Sodium (VOLTAREN) 1 % GEL Apply 4 g topically 4 (four) times daily.   esomeprazole (NEXIUM) 40 MG capsule Take 40 mg by mouth daily as needed.    guaiFENesin-codeine (CHERATUSSIN AC) 100-10 MG/5ML syrup Take 5 mLs by mouth 3 (three) times daily as needed.   levofloxacin (LEVAQUIN) 500 MG tablet Take 1 tablet (500 mg total) by mouth daily.   oxyCODONE (OXY IR/ROXICODONE) 5 MG immediate release tablet SMARTSIG:5 Milligram(s) By Mouth Every 8 Hours PRN   rOPINIRole (REQUIP) 2 MG tablet Take 1 tablet (2 mg total) by mouth in the morning and at bedtime.   simvastatin (ZOCOR) 40 MG tablet TAKE 1 TABLET BY MOUTH EVERY DAY   No facility-administered encounter medications on file as of 04/03/2021.    Current COPD regimen:  Breztri 160-9-4.8 mcg inhaler  Any recent hospitalizations or ED visits since last visit with CPP? No  denies COPD symptoms, including Increased shortness of breath , Rescue medicine is not helping, Shortness of breath at rest, Symptoms worse with exercise, Symptoms worse at night, and Wheezing   What recent interventions/DTPs have been made by any  provider to improve breathing since last visit: No recent changes    Have you had exacerbation/flare-up since last visit? No flares ups   What do you do when you are short of breath?  Rest  Current tobacco use? No    Respiratory Devices/Equipment Do you have a nebulizer? No Do you use a Peak Flow Meter? No Do you use a maintenance inhaler? No How often do you forget to use your daily inhaler? No  Do you use a rescue inhaler? Yes How often do you use your rescue inhaler?  1-2x per week Do you use a spacer with your inhaler? No  Adherence Review: Does the patient have >5 day gap between last estimated fill date for maintenance inhaler medications? None noted. Pt picks up supply at Dr. Blanch Media office   Care Gaps: Last annual wellness visit? 12/05/18  Elray Mcgregor, [place] Pharmacist Assistant  [phone]

## 2021-04-04 ENCOUNTER — Ambulatory Visit (INDEPENDENT_AMBULATORY_CARE_PROVIDER_SITE_OTHER): Payer: Medicare Other | Admitting: Legal Medicine

## 2021-04-04 ENCOUNTER — Other Ambulatory Visit: Payer: Self-pay

## 2021-04-04 ENCOUNTER — Encounter: Payer: Self-pay | Admitting: Legal Medicine

## 2021-04-04 VITALS — BP 138/78 | HR 84 | Temp 97.1°F | Resp 16 | Ht 64.0 in | Wt 159.0 lb

## 2021-04-04 DIAGNOSIS — E782 Mixed hyperlipidemia: Secondary | ICD-10-CM

## 2021-04-04 DIAGNOSIS — Z23 Encounter for immunization: Secondary | ICD-10-CM | POA: Diagnosis not present

## 2021-04-04 DIAGNOSIS — S81801A Unspecified open wound, right lower leg, initial encounter: Secondary | ICD-10-CM | POA: Diagnosis not present

## 2021-04-04 DIAGNOSIS — H906 Mixed conductive and sensorineural hearing loss, bilateral: Secondary | ICD-10-CM | POA: Diagnosis not present

## 2021-04-04 DIAGNOSIS — R7309 Other abnormal glucose: Secondary | ICD-10-CM | POA: Diagnosis not present

## 2021-04-04 DIAGNOSIS — M810 Age-related osteoporosis without current pathological fracture: Secondary | ICD-10-CM

## 2021-04-04 DIAGNOSIS — J449 Chronic obstructive pulmonary disease, unspecified: Secondary | ICD-10-CM | POA: Diagnosis not present

## 2021-04-04 DIAGNOSIS — N1831 Chronic kidney disease, stage 3a: Secondary | ICD-10-CM

## 2021-04-04 DIAGNOSIS — I10 Essential (primary) hypertension: Secondary | ICD-10-CM

## 2021-04-04 DIAGNOSIS — C679 Malignant neoplasm of bladder, unspecified: Secondary | ICD-10-CM

## 2021-04-04 MED ORDER — CEPHALEXIN 500 MG PO CAPS: 500.0000 mg | ORAL_CAPSULE | Freq: Two times a day (BID) | ORAL | 0 refills | Status: DC

## 2021-04-04 MED ORDER — SILVER SULFADIAZINE 1 % EX CREA: 1.0000 "application " | TOPICAL_CREAM | Freq: Every day | CUTANEOUS | 2 refills | Status: DC

## 2021-04-04 NOTE — Progress Notes (Signed)
Established Patient Office Visit  Subjective:  Patient ID: Alexandra [name], female    DOB: March 26, 1941  Age: 80 y.o. MRN: [id number]  CC:  Chief Complaint  Patient presents with   Hypertension    HPI Alexandra [name] presents for chronic visit  Patient presents for follow up of hypertension.  Patient tolerating amlodipine,  well with side effects.  Patient was diagnosed with hypertension 2010 so has been treated for hypertension for 12 years.Patient is working on maintaining diet and exercise regimen and follows up as directed. Complication include none.   Patient presents with hyperlipidemia.  Compliance with treatment has been good; patient takes medicines as directed, maintains low cholesterol diet, follows up as directed, and maintains exercise regimen.  Patient is using simvastatin without problems.   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   Restless legs syndrome     Past Surgical History:  Procedure Laterality Date   ABDOMINAL HYSTERECTOMY     complete   BACK SURGERY  2016   lower L 4 to L 5   bladder tack and uterus lift  10/10/2015   IR RADIOLOGIST EVAL & MGMT  12/03/2018   IR RADIOLOGIST EVAL & MGMT  12/31/2018   left nephrectomy  1999   PARATHYROIDECTOMY Left 07/02/2016   Procedure: LEFT INFERIOR PARATHYROIDECTOMY;  Surgeon: Armandina Gemma, MD;  Location: [facility];  Service: General;  Laterality: Left;   SHOULDER OPEN ROTATOR CUFF REPAIR  12/2019    Family History  Problem Relation Age of Onset   Emphysema Mother     Social History   Socioeconomic History   Marital status: Married    Spouse name: Not on file   Number of children: Not on file   Years of education: Not on file   Highest education level: Not on file  Occupational History   Not on file  Tobacco Use   Smoking status: Former    Packs/day: 1.00    Years: 50.00    Pack years: 50.00    Types: Cigarettes    Quit date: 06/11/2005    Years since quitting:  15.8   Smokeless tobacco: Never  Vaping Use   Vaping Use: Never used  Substance and Sexual Activity   Alcohol use: No   Drug use: No   Sexual activity: Yes    Partners: Male  Other Topics Concern   Not on file  Social History Narrative   Not on file   Social Determinants of Health   Financial Resource Strain: Not on file  Food Insecurity: Not on file  Transportation Needs: No Transportation Needs   Lack of Transportation (Medical): No   Lack of Transportation (Non-Medical): No  Physical Activity: Not on file  Stress: Not on file  Social Connections: Not on file  Intimate Partner Violence: Not on file    Outpatient Medications Prior to Visit  Medication Sig Dispense Refill   amLODipine (NORVASC) 10 MG tablet TAKE 1 TABLET(10 MG) BY MOUTH EVERY MORNING 90 tablet 2   Budeson-Glycopyrrol-Formoterol (BREZTRI AEROSPHERE) 160-9-4.8 MCG/ACT AERO Inhale 2 puffs into the lungs in the morning and at bedtime. 10.7 g 6   diclofenac Sodium (VOLTAREN) 1 % GEL Apply 4 g topically 4 (four) times daily. 4 g 0   esomeprazole (NEXIUM) 40 MG capsule Take 40 mg by mouth daily as needed.      rOPINIRole (REQUIP) 2 MG tablet Take 1 tablet (2 mg total) by mouth in the morning and at  bedtime. 180 tablet 6   simvastatin (ZOCOR) 40 MG tablet TAKE 1 TABLET BY MOUTH EVERY DAY 90 tablet 2   amoxicillin-clavulanate (AUGMENTIN) 875-125 MG tablet Take 1 tablet by mouth 2 (two) times daily.     guaiFENesin-codeine (CHERATUSSIN AC) 100-10 MG/5ML syrup Take 5 mLs by mouth 3 (three) times daily as needed. 120 mL 0   levofloxacin (LEVAQUIN) 500 MG tablet Take 1 tablet (500 mg total) by mouth daily. 7 tablet 0   oxyCODONE (OXY IR/ROXICODONE) 5 MG immediate release tablet SMARTSIG:5 Milligram(s) By Mouth Every 8 Hours PRN 30 tablet 0   No facility-administered medications prior to visit.    Allergies  Allergen Reactions   Contrast Media [Iodinated Diagnostic Agents] Hives, Itching and Rash   Shellfish Allergy  Nausea And Vomiting   Shellfish-Derived Products Nausea And Vomiting    ROS Review of Systems  Constitutional:  Positive for unexpected weight change. Negative for activity change and appetite change.  HENT:  Negative for congestion.   Respiratory:  Negative for cough, chest tightness and shortness of breath.   Cardiovascular: Negative.  Negative for chest pain, palpitations and leg swelling.  Gastrointestinal:  Negative for abdominal distention and abdominal pain.  Endocrine: Negative for polyuria.  Genitourinary:  Negative for difficulty urinating.  Musculoskeletal:  Negative for arthralgias and back pain.  Allergic/Immunologic: Negative for immunocompromised state.  Neurological: Negative.   Psychiatric/Behavioral: Negative.       Objective:    Physical Exam Vitals reviewed.  Constitutional:      General: She is not in acute distress.    Appearance: Normal appearance.  HENT:     Head: Normocephalic.     Right Ear: Tympanic membrane, ear canal and external ear normal.     Left Ear: Tympanic membrane, ear canal and external ear normal.     Nose: Nose normal.     Mouth/Throat:     Mouth: Mucous membranes are moist.     Pharynx: Oropharynx is clear.  Eyes:     Extraocular Movements: Extraocular movements intact.     Conjunctiva/sclera: Conjunctivae normal.     Pupils: Pupils are equal, round, and reactive to light.  Cardiovascular:     Rate and Rhythm: Normal rate and regular rhythm.     Pulses: Normal pulses.     Heart sounds: Normal heart sounds. No murmur heard.   No gallop.  Pulmonary:     Effort: Pulmonary effort is normal. No respiratory distress.     Breath sounds: Normal breath sounds. No wheezing.  Abdominal:     General: Abdomen is flat. Bowel sounds are normal. There is no distension.     Palpations: Abdomen is soft.     Tenderness: There is no abdominal tenderness.  Musculoskeletal:        General: Normal range of motion.     Cervical back: Normal range  of motion.  Skin:    Capillary Refill: Capillary refill takes less than 2 seconds.     Comments: 5 by 6 cm skin avulsion right lower leg  Neurological:     General: No focal deficit present.     Mental Status: She is alert and oriented to person, place, and time. Mental status is at baseline.  Psychiatric:        Mood and Affect: Mood normal.    BP 138/78   Pulse 84   Temp (!) 97.1 F (36.2 C)   Resp 16   Ht '5\' 4"'  (1.626 m)   Wt 159 lb (  72.1 kg)   BMI 27.29 kg/m  Wt Readings from Last 3 Encounters:  04/04/21 159 lb (72.1 kg)  03/07/21 163 lb 9.6 oz (74.2 kg)  01/18/21 172 lb (78 kg)     Health Maintenance Due  Topic Date Due   TETANUS/TDAP  Never done   DEXA SCAN  Never done   Pneumonia Vaccine 56+ Years old (2 - PPSV23 if available, else PCV20) 03/27/2018   COVID-19 Vaccine (3 - Moderna risk series) 12/29/2020    There are no preventive care reminders to display for this patient.  No results found for: TSH Lab Results  Component Value Date   WBC 9.2 09/26/2020   HGB 11.7 09/26/2020   HCT 35.7 09/26/2020   MCV 92 09/26/2020   PLT 377 09/26/2020   Lab Results  Component Value Date   NA 142 01/18/2021   K 4.5 01/18/2021   CO2 17 (L) 01/18/2021   GLUCOSE 119 (H) 01/18/2021   BUN 19 01/18/2021   CREATININE 1.97 (H) 01/18/2021   BILITOT 0.3 01/18/2021   ALKPHOS 151 (H) 01/18/2021   AST 30 01/18/2021   ALT 122 (H) 01/18/2021   PROT 6.1 01/18/2021   ALBUMIN 3.5 (L) 01/18/2021   CALCIUM 9.7 01/18/2021   ANIONGAP 5 06/26/2016   EGFR 26 (L) 01/18/2021   Lab Results  Component Value Date   CHOL 144 09/26/2020   Lab Results  Component Value Date   HDL 54 09/26/2020   Lab Results  Component Value Date   LDLCALC 73 09/26/2020   Lab Results  Component Value Date   TRIG 90 09/26/2020   Lab Results  Component Value Date   CHOLHDL 2.7 09/26/2020   No results found for: HGBA1C    Assessment & Plan:   Problem List Items Addressed This Visit        Cardiovascular and Mediastinum   Essential hypertension - Primary   Relevant Orders   Comprehensive metabolic panel   CBC with Differential/Platelet An individual hypertension care plan was established and reinforced today.  The patient's status was assessed using clinical findings on exam and labs or diagnostic tests. The patient's success at meeting treatment goals on disease specific evidence-based guidelines and found to be well controlled. SELF MANAGEMENT: The patient and I together assessed ways to personally work towards obtaining the recommended goals. RECOMMENDATIONS: avoid decongestants found in common cold remedies, decrease consumption of alcohol, perform routine monitoring of BP with home BP cuff, exercise, reduction of dietary salt, take medicines as prescribed, try not to miss doses and quit smoking.  Regular exercise and maintaining a healthy weight is needed.  Stress reduction may help. A CLINICAL SUMMARY including written plan identify barriers to care unique to individual due to social or financial issues.  We attempt to mutually creat solutions for individual and family understanding.      Respiratory   COPD, moderate ([place]) An individualize plan was formulated for care of COPD.  Treatment is evidence based.  She will continue on inhalers, avoid smoking and smoke.  Regular exercise with help with dyspnea. Routine follow ups and medication compliance is needed.      Nervous and Auditory   Mixed conductive and sensorineural hearing loss, bilateral I recommended hearing evaluation     Musculoskeletal and Integument   Age related osteoporosis   Relevant Orders   DG Bone Density AN INDIVIDUAL CARE PLAN for osteoporosis was established and reinforced today.  The patient's status was assessed using clinical findings on exam, labs,  and other diagnostic testing. Patient's success at meeting treatment goals based on disease specific evidence-bassed guidelines and found to be in good  control. RECOMMENDATIONS include maintaining present medicines and treatment.      Genitourinary   Bladder cancer ([place])   Relevant Medications   cephALEXin (KEFLEX) 500 MG capsule Patient is  seeing urology for bladder cancer   Chronic kidney disease, stage 3a ([place])     Other   Hyperlipemia   Relevant Orders   Lipid panel AN INDIVIDUAL CARE PLAN for hyperlipidemia/ cholesterol was established and reinforced today.  The patient's status was assessed using clinical findings on exam, lab and other diagnostic tests. The patient's disease status was assessed based on evidence-based guidelines and found to be fair controlled. MEDICATIONS were reviewed. SELF MANAGEMENT GOALS have been discussed and patient's success at attaining the goal of low cholesterol was assessed. RECOMMENDATION given include regular exercise 3 days a week and low cholesterol/low fat diet. CLINICAL SUMMARY including written plan to identify barriers unique to the patient due to social or economic  reasons was discussed.    Other Visit Diagnoses     Avulsion of skin of right lower leg, initial encounter       Relevant Medications   cephALEXin (KEFLEX) 500 MG capsule   silver sulfADIAZINE (SILVADENE) 1 % cream Dress wound and start on keflex and silvadene dressing    Need for immunization against influenza       Relevant Orders   Flu Vaccine QUAD High Dose(Fluad) (Completed)     30 minute visit with review of symptoms  Meds ordered this encounter  Medications   cephALEXin (KEFLEX) 500 MG capsule    Sig: Take 1 capsule (500 mg total) by mouth 2 (two) times daily.    Dispense:  20 capsule    Refill:  0   silver sulfADIAZINE (SILVADENE) 1 % cream    Sig: Apply 1 application topically daily.    Dispense:  50 g    Refill:  2    Follow-up: Return in about 1 week (around 04/11/2021) for ulcer leg.    Reinaldo Meeker, MD

## 2021-04-05 LAB — LIPID PANEL
Chol/HDL Ratio: 2.3 ratio (ref 0.0–4.4)
Cholesterol, Total: 147 mg/dL (ref 100–199)
HDL: 63 mg/dL (ref >39)
LDL Chol Calc (NIH): 64 mg/dL (ref 0–99)
Triglycerides: 113 mg/dL (ref 0–149)
VLDL Cholesterol Cal: 20 mg/dL (ref 5–40)

## 2021-04-05 LAB — COMPREHENSIVE METABOLIC PANEL
ALT: 15 IU/L (ref 0–32)
AST: 17 IU/L (ref 0–40)
Albumin/Globulin Ratio: 1.6 (ref 1.2–2.2)
Albumin: 4.2 g/dL (ref 3.7–4.7)
Alkaline Phosphatase: 96 IU/L (ref 44–121)
BUN/Creatinine Ratio: 15 (ref 12–28)
BUN: 26 mg/dL (ref 8–27)
Bilirubin Total: 0.3 mg/dL (ref 0.0–1.2)
CO2: 22 mmol/L (ref 20–29)
Calcium: 9.8 mg/dL (ref 8.7–10.3)
Chloride: 103 mmol/L (ref 96–106)
Creatinine, Ser: 1.69 mg/dL — ABNORMAL HIGH (ref 0.57–1.00)
Globulin, Total: 2.7 g/dL (ref 1.5–4.5)
Glucose: 110 mg/dL — ABNORMAL HIGH (ref 70–99)
Potassium: 5 mmol/L (ref 3.5–5.2)
Sodium: 140 mmol/L (ref 134–144)
Total Protein: 6.9 g/dL (ref 6.0–8.5)
eGFR: 31 mL/min/{1.73_m2} — ABNORMAL LOW (ref >59)

## 2021-04-05 LAB — CBC WITH DIFFERENTIAL/PLATELET
Basophils Absolute: 0 10*3/uL (ref 0.0–0.2)
Basos: 0 %
EOS (ABSOLUTE): 0.2 10*3/uL (ref 0.0–0.4)
Eos: 1 %
Hematocrit: 37.4 % (ref 34.0–46.6)
Hemoglobin: 12 g/dL (ref 11.1–15.9)
Immature Grans (Abs): 0.1 10*3/uL (ref 0.0–0.1)
Immature Granulocytes: 1 %
Lymphocytes Absolute: 2.7 10*3/uL (ref 0.7–3.1)
Lymphs: 20 %
MCH: 30.2 pg (ref 26.6–33.0)
MCHC: 32.1 g/dL (ref 31.5–35.7)
MCV: 94 fL (ref 79–97)
Monocytes Absolute: 0.9 10*3/uL (ref 0.1–0.9)
Monocytes: 6 %
Neutrophils Absolute: 9.9 10*3/uL — ABNORMAL HIGH (ref 1.4–7.0)
Neutrophils: 72 %
Platelets: 367 10*3/uL (ref 150–450)
RBC: 3.98 x10E6/uL (ref 3.77–5.28)
RDW: 13 % (ref 11.7–15.4)
WBC: 13.9 10*3/uL — ABNORMAL HIGH (ref 3.4–10.8)

## 2021-04-05 LAB — CARDIOVASCULAR RISK ASSESSMENT

## 2021-04-05 NOTE — Progress Notes (Signed)
Glucose 110, kidney tests stable stage 4, liver test normal, cholesterol normal, wbc high is she sick,  lp

## 2021-04-11 ENCOUNTER — Ambulatory Visit (INDEPENDENT_AMBULATORY_CARE_PROVIDER_SITE_OTHER): Payer: Medicare Other | Admitting: Legal Medicine

## 2021-04-11 ENCOUNTER — Other Ambulatory Visit: Payer: Self-pay

## 2021-04-11 ENCOUNTER — Encounter: Payer: Self-pay | Admitting: Legal Medicine

## 2021-04-11 DIAGNOSIS — L97919 Non-pressure chronic ulcer of unspecified part of right lower leg with unspecified severity: Secondary | ICD-10-CM | POA: Insufficient documentation

## 2021-04-11 DIAGNOSIS — L97911 Non-pressure chronic ulcer of unspecified part of right lower leg limited to breakdown of skin: Secondary | ICD-10-CM | POA: Diagnosis not present

## 2021-04-11 LAB — SPECIMEN STATUS REPORT

## 2021-04-11 LAB — HGB A1C W/O EAG: Hgb A1c MFr Bld: 5.6 % (ref 4.8–5.6)

## 2021-04-11 MED ORDER — FUROSEMIDE 40 MG PO TABS: 40.0000 mg | ORAL_TABLET | Freq: Every day | ORAL | 3 refills | Status: DC

## 2021-04-11 NOTE — Progress Notes (Signed)
A1c 5.6,  lp

## 2021-04-11 NOTE — Progress Notes (Signed)
Established Patient Office Visit  Subjective:  Patient ID: Alexandra [name], female    DOB: 1940-12-19  Age: 80 y.o. MRN: [id number]  CC:  Chief Complaint  Patient presents with   Follow-up    leg    HPI Alexandra [name] presents for follow up right leg ulcer, she is keeping it dressed.  Less erythema.  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   Restless legs syndrome     Past Surgical History:  Procedure Laterality Date   ABDOMINAL HYSTERECTOMY     complete   BACK SURGERY  2016   lower L 4 to L 5   bladder tack and uterus lift  10/10/2015   IR RADIOLOGIST EVAL & MGMT  12/03/2018   IR RADIOLOGIST EVAL & MGMT  12/31/2018   left nephrectomy  1999   PARATHYROIDECTOMY Left 07/02/2016   Procedure: LEFT INFERIOR PARATHYROIDECTOMY;  Surgeon: Armandina Gemma, MD;  Location: [facility];  Service: General;  Laterality: Left;   SHOULDER OPEN ROTATOR CUFF REPAIR  12/2019    Family History  Problem Relation Age of Onset   Emphysema Mother     Social History   Socioeconomic History   Marital status: Married    Spouse name: Not on file   Number of children: Not on file   Years of education: Not on file   Highest education level: Not on file  Occupational History   Not on file  Tobacco Use   Smoking status: Former    Packs/day: 1.00    Years: 50.00    Pack years: 50.00    Types: Cigarettes    Quit date: 06/11/2005    Years since quitting: 15.8   Smokeless tobacco: Never  Vaping Use   Vaping Use: Never used  Substance and Sexual Activity   Alcohol use: No   Drug use: No   Sexual activity: Yes    Partners: Male  Other Topics Concern   Not on file  Social History Narrative   Not on file   Social Determinants of Health   Financial Resource Strain: Not on file  Food Insecurity: Not on file  Transportation Needs: No Transportation Needs   Lack of Transportation (Medical): No   Lack of Transportation (Non-Medical): No  Physical  Activity: Not on file  Stress: Not on file  Social Connections: Not on file  Intimate Partner Violence: Not on file    Outpatient Medications Prior to Visit  Medication Sig Dispense Refill   amLODipine (NORVASC) 10 MG tablet TAKE 1 TABLET(10 MG) BY MOUTH EVERY MORNING 90 tablet 2   Budeson-Glycopyrrol-Formoterol (BREZTRI AEROSPHERE) 160-9-4.8 MCG/ACT AERO Inhale 2 puffs into the lungs in the morning and at bedtime. 10.7 g 6   cephALEXin (KEFLEX) 500 MG capsule Take 1 capsule (500 mg total) by mouth 2 (two) times daily. 20 capsule 0   diclofenac Sodium (VOLTAREN) 1 % GEL Apply 4 g topically 4 (four) times daily. 4 g 0   esomeprazole (NEXIUM) 40 MG capsule Take 40 mg by mouth daily as needed.      rOPINIRole (REQUIP) 2 MG tablet Take 1 tablet (2 mg total) by mouth in the morning and at bedtime. 180 tablet 6   silver sulfADIAZINE (SILVADENE) 1 % cream Apply 1 application topically daily. 50 g 2   simvastatin (ZOCOR) 40 MG tablet TAKE 1 TABLET BY MOUTH EVERY DAY 90 tablet 2   No facility-administered medications prior to visit.    Allergies  Allergen Reactions   Contrast Media [Iodinated Diagnostic Agents] Hives, Itching and Rash   Shellfish Allergy Nausea And Vomiting   Shellfish-Derived Products Nausea And Vomiting    ROS Review of Systems  Constitutional:  Negative for activity change and appetite change.  HENT:  Negative for congestion and facial swelling.   Eyes:  Negative for visual disturbance.  Respiratory:  Negative for apnea, chest tightness and shortness of breath.   Cardiovascular: Negative.  Negative for chest pain, palpitations and leg swelling.  Gastrointestinal: Negative.  Negative for abdominal distention and abdominal pain.  Genitourinary:  Negative for difficulty urinating and dyspareunia.  Musculoskeletal: Negative.   Skin:  Positive for wound.  Psychiatric/Behavioral: Negative.       Objective:    Physical Exam Constitutional:      Appearance: She is  normal weight.  HENT:     Head: Normocephalic.     Mouth/Throat:     Mouth: Mucous membranes are dry.     Pharynx: Oropharynx is clear.  Cardiovascular:     Rate and Rhythm: Normal rate and regular rhythm.     Pulses: Normal pulses.     Heart sounds: No murmur heard.   No gallop.  Pulmonary:     Effort: Pulmonary effort is normal. No respiratory distress.     Breath sounds: Normal breath sounds. No wheezing.  Musculoskeletal:     Cervical back: Normal range of motion and neck supple.  Skin:    Comments: 4 by 5 cm superficial skin avulsion right pretibial area, edema present  Neurological:     General: No focal deficit present.     Mental Status: Mental status is at baseline.    BP 112/70 (BP Location: Left Arm, Patient Position: Sitting, Cuff Size: Normal)   Pulse 98   Temp (!) 97.2 F (36.2 C) (Temporal)   Ht _0  (1.626 m)   Wt 160 lb 3.2 oz (72.7 kg)   SpO2 98%   BMI 27.50 kg/m  Wt Readings from Last 3 Encounters:  04/11/21 160 lb 3.2 oz (72.7 kg)  04/04/21 159 lb (72.1 kg)  03/07/21 163 lb 9.6 oz (74.2 kg)     Health Maintenance Due  Topic Date Due   TETANUS/TDAP  Never done   DEXA SCAN  Never done   Pneumonia Vaccine 5+ Years old (2 - PPSV23 if available, else PCV20) 03/27/2018   COVID-19 Vaccine (3 - Moderna risk series) 12/29/2020    There are no preventive care reminders to display for this patient.  No results found for: TSH Lab Results  Component Value Date   WBC 13.9 (H) 04/04/2021   HGB 12.0 04/04/2021   HCT 37.4 04/04/2021   MCV 94 04/04/2021   PLT 367 04/04/2021   Lab Results  Component Value Date   NA 140 04/04/2021   K 5.0 04/04/2021   CO2 22 04/04/2021   GLUCOSE 110 (H) 04/04/2021   BUN 26 04/04/2021   CREATININE 1.69 (H) 04/04/2021   BILITOT 0.3 04/04/2021   ALKPHOS 96 04/04/2021   AST 17 04/04/2021   ALT 15 04/04/2021   PROT 6.9 04/04/2021   ALBUMIN 4.2 04/04/2021   CALCIUM 9.8 04/04/2021   ANIONGAP 5 06/26/2016   EGFR 31  (L) 04/04/2021   Lab Results  Component Value Date   CHOL 147 04/04/2021   Lab Results  Component Value Date   HDL 63 04/04/2021   Lab Results  Component Value Date   LDLCALC 64 04/04/2021   Lab Results  Component Value Date   TRIG 113 04/04/2021   Lab Results  Component Value Date   CHOLHDL 2.3 04/04/2021   Lab Results  Component Value Date   HGBA1C 5.6 04/04/2021      Assessment & Plan:   Problem List Items Addressed This Visit       Other   Ulcer of right leg ([facility])   Relevant Medications   furosemide (LASIX) 40 MG tablet  Ulcer on right leg healing well, continue to dress. Increase lasix to 40 mg     Follow-up: Return in about 1 week (around 04/18/2021).    Reinaldo Meeker, MD

## 2021-04-12 ENCOUNTER — Telehealth: Payer: Self-pay | Admitting: Legal Medicine

## 2021-04-12 NOTE — Telephone Encounter (Signed)
   Alexandra [name] has been scheduled for the following appointment:  WHAT: BONE DENSITY WHERE: [facility] DATE: 07/05/21 TIME: 9:30 AM ARRIVAL TIME  Patient has been made aware.

## 2021-04-17 ENCOUNTER — Encounter: Payer: Self-pay | Admitting: Legal Medicine

## 2021-04-17 ENCOUNTER — Other Ambulatory Visit: Payer: Self-pay

## 2021-04-17 ENCOUNTER — Ambulatory Visit (INDEPENDENT_AMBULATORY_CARE_PROVIDER_SITE_OTHER): Payer: Medicare Other | Admitting: Legal Medicine

## 2021-04-17 VITALS — BP 110/70 | HR 97 | Resp 16 | Ht 64.0 in | Wt 157.0 lb

## 2021-04-17 DIAGNOSIS — L97911 Non-pressure chronic ulcer of unspecified part of right lower leg limited to breakdown of skin: Secondary | ICD-10-CM

## 2021-04-17 DIAGNOSIS — E782 Mixed hyperlipidemia: Secondary | ICD-10-CM

## 2021-04-17 MED ORDER — SIMVASTATIN 40 MG PO TABS: 40.0000 mg | ORAL_TABLET | Freq: Every day | ORAL | 2 refills | Status: DC

## 2021-04-17 NOTE — Progress Notes (Signed)
Subjective:  Patient ID: Alexandra [name], female    DOB: Apr 25, 1941  Age: 80 y.o. MRN: [id number]  Chief Complaint  Patient presents with   Wound Check    HPI: follow up leg ulcer on right lower leg.  It is healing well.  No drainign or erythema.  It is healed.   Current Outpatient Medications on File Prior to Visit  Medication Sig Dispense Refill   amLODipine (NORVASC) 10 MG tablet TAKE 1 TABLET(10 MG) BY MOUTH EVERY MORNING 90 tablet 2   Budeson-Glycopyrrol-Formoterol (BREZTRI AEROSPHERE) 160-9-4.8 MCG/ACT AERO Inhale 2 puffs into the lungs in the morning and at bedtime. 10.7 g 6   diclofenac Sodium (VOLTAREN) 1 % GEL Apply 4 g topically 4 (four) times daily. 4 g 0   esomeprazole (NEXIUM) 40 MG capsule Take 40 mg by mouth daily as needed.      furosemide (LASIX) 40 MG tablet Take 1 tablet (40 mg total) by mouth daily. 30 tablet 3   rOPINIRole (REQUIP) 2 MG tablet Take 1 tablet (2 mg total) by mouth in the morning and at bedtime. 180 tablet 6   silver sulfADIAZINE (SILVADENE) 1 % cream Apply 1 application topically daily. 50 g 2   No current facility-administered medications on file prior to visit.   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   Restless legs syndrome    Past Surgical History:  Procedure Laterality Date   ABDOMINAL HYSTERECTOMY     complete   BACK SURGERY  2016   lower L 4 to L 5   bladder tack and uterus lift  10/10/2015   IR RADIOLOGIST EVAL & MGMT  12/03/2018   IR RADIOLOGIST EVAL & MGMT  12/31/2018   left nephrectomy  1999   PARATHYROIDECTOMY Left 07/02/2016   Procedure: LEFT INFERIOR PARATHYROIDECTOMY;  Surgeon: Armandina Gemma, MD;  Location: [facility];  Service: General;  Laterality: Left;   SHOULDER OPEN ROTATOR CUFF REPAIR  12/2019    Family History  Problem Relation Age of Onset   Emphysema Mother    Social History   Socioeconomic History   Marital status: Married    Spouse name: Not on file   Number of  children: Not on file   Years of education: Not on file   Highest education level: Not on file  Occupational History   Not on file  Tobacco Use   Smoking status: Former    Packs/day: 1.00    Years: 50.00    Pack years: 50.00    Types: Cigarettes    Quit date: 06/11/2005    Years since quitting: 15.8   Smokeless tobacco: Never  Vaping Use   Vaping Use: Never used  Substance and Sexual Activity   Alcohol use: No   Drug use: No   Sexual activity: Yes    Partners: Male  Other Topics Concern   Not on file  Social History Narrative   Not on file   Social Determinants of Health   Financial Resource Strain: Not on file  Food Insecurity: Not on file  Transportation Needs: No Transportation Needs   Lack of Transportation (Medical): No   Lack of Transportation (Non-Medical): No  Physical Activity: Not on file  Stress: Not on file  Social Connections: Not on file    Review of Systems  Constitutional:  Negative for chills, fatigue and fever.  HENT:  Negative for congestion, ear pain and sore throat.   Respiratory:  Negative for cough and  shortness of breath.   Cardiovascular:  Negative for chest pain and palpitations.  Gastrointestinal:  Negative for abdominal pain, constipation, diarrhea, nausea and vomiting.  Endocrine: Negative for polydipsia, polyphagia and polyuria.  Genitourinary:  Negative for difficulty urinating and dysuria.  Musculoskeletal:  Negative for arthralgias, back pain and myalgias.  Skin:  Negative for rash.  Neurological:  Negative for headaches.  Psychiatric/Behavioral:  Negative for dysphoric mood. The patient is not nervous/anxious.     Objective:  BP 110/70   Pulse 97   Resp 16   Ht 5\' 4"  (1.626 m)   Wt 157 lb (71.2 kg)   SpO2 97%   BMI 26.95 kg/m   BP/Weight 04/17/2021 04/11/2021 38/25/0539  Systolic BP 767 341 937  Diastolic BP 70 70 78  Wt. (Lbs) 157 160.2 159  BMI 26.95 27.5 27.29    Physical Exam Vitals reviewed.  HENT:     Right  Ear: Tympanic membrane normal.     Left Ear: Tympanic membrane normal.  Cardiovascular:     Rate and Rhythm: Normal rate and regular rhythm.     Pulses: Normal pulses.     Heart sounds: Normal heart sounds. No murmur heard.   No gallop.  Pulmonary:     Effort: Pulmonary effort is normal. No respiratory distress.     Breath sounds: No wheezing.  Musculoskeletal:        General: Normal range of motion.     Cervical back: Normal range of motion.  Skin:    General: Skin is warm.     Capillary Refill: Capillary refill takes less than 2 seconds.     Comments: Right lower leg ulcer well healed, no infection  Neurological:     Mental Status: She is alert.        Lab Results  Component Value Date   WBC 13.9 (H) 04/04/2021   HGB 12.0 04/04/2021   HCT 37.4 04/04/2021   PLT 367 04/04/2021   GLUCOSE 110 (H) 04/04/2021   CHOL 147 04/04/2021   TRIG 113 04/04/2021   HDL 63 04/04/2021   LDLCALC 64 04/04/2021   ALT 15 04/04/2021   AST 17 04/04/2021   NA 140 04/04/2021   K 5.0 04/04/2021   CL 103 04/04/2021   CREATININE 1.69 (H) 04/04/2021   BUN 26 04/04/2021   CO2 22 04/04/2021   HGBA1C 5.6 04/04/2021      Assessment & Plan:   Problem List Items Addressed This Visit       Other   Hyperlipemia   Relevant Medications   simvastatin (ZOCOR) 40 MG tablet Renew simvastatin   Ulcer of right leg ([place]) - Primary  .right leg ulcer healed, follow up PRN       Follow-up: Return in about 4 months (around 08/15/2021) for fasting chronic.  An After Visit Summary was printed and given to the patient.  Reinaldo Meeker, MD [facility] [phone]

## 2021-04-27 DIAGNOSIS — C674 Malignant neoplasm of posterior wall of bladder: Secondary | ICD-10-CM | POA: Diagnosis not present

## 2021-04-27 DIAGNOSIS — Z905 Acquired absence of kidney: Secondary | ICD-10-CM | POA: Diagnosis not present

## 2021-04-27 DIAGNOSIS — N2889 Other specified disorders of kidney and ureter: Secondary | ICD-10-CM | POA: Diagnosis not present

## 2021-04-27 DIAGNOSIS — D494 Neoplasm of unspecified behavior of bladder: Secondary | ICD-10-CM | POA: Diagnosis not present

## 2021-04-27 DIAGNOSIS — C675 Malignant neoplasm of bladder neck: Secondary | ICD-10-CM | POA: Diagnosis not present

## 2021-04-27 DIAGNOSIS — C672 Malignant neoplasm of lateral wall of bladder: Secondary | ICD-10-CM | POA: Diagnosis not present

## 2021-04-27 DIAGNOSIS — I129 Hypertensive chronic kidney disease with stage 1 through stage 4 chronic kidney disease, or unspecified chronic kidney disease: Secondary | ICD-10-CM | POA: Diagnosis not present

## 2021-04-27 DIAGNOSIS — N189 Chronic kidney disease, unspecified: Secondary | ICD-10-CM | POA: Diagnosis not present

## 2021-04-27 DIAGNOSIS — Z85528 Personal history of other malignant neoplasm of kidney: Secondary | ICD-10-CM | POA: Diagnosis not present

## 2021-04-27 DIAGNOSIS — J449 Chronic obstructive pulmonary disease, unspecified: Secondary | ICD-10-CM | POA: Diagnosis not present

## 2021-04-27 DIAGNOSIS — Z87891 Personal history of nicotine dependence: Secondary | ICD-10-CM | POA: Diagnosis not present

## 2021-05-05 DIAGNOSIS — J111 Influenza due to unidentified influenza virus with other respiratory manifestations: Secondary | ICD-10-CM | POA: Diagnosis not present

## 2021-05-08 ENCOUNTER — Telehealth: Payer: Self-pay

## 2021-05-08 ENCOUNTER — Ambulatory Visit (INDEPENDENT_AMBULATORY_CARE_PROVIDER_SITE_OTHER): Payer: Medicare Other | Admitting: Legal Medicine

## 2021-05-08 ENCOUNTER — Encounter: Payer: Self-pay | Admitting: Legal Medicine

## 2021-05-08 VITALS — BP 120/70 | HR 85 | Temp 98.2°F | Resp 16 | Ht 64.0 in | Wt 157.0 lb

## 2021-05-08 DIAGNOSIS — R059 Cough, unspecified: Secondary | ICD-10-CM | POA: Diagnosis not present

## 2021-05-08 DIAGNOSIS — J101 Influenza due to other identified influenza virus with other respiratory manifestations: Secondary | ICD-10-CM | POA: Diagnosis not present

## 2021-05-08 DIAGNOSIS — R0602 Shortness of breath: Secondary | ICD-10-CM | POA: Diagnosis not present

## 2021-05-08 DIAGNOSIS — J180 Bronchopneumonia, unspecified organism: Secondary | ICD-10-CM | POA: Diagnosis not present

## 2021-05-08 DIAGNOSIS — R0981 Nasal congestion: Secondary | ICD-10-CM | POA: Diagnosis not present

## 2021-05-08 LAB — POCT INFLUENZA A/B
Influenza A, POC: NEGATIVE
Influenza B, POC: NEGATIVE

## 2021-05-08 LAB — POC COVID19 BINAXNOW: SARS Coronavirus 2 Ag: NEGATIVE

## 2021-05-08 MED ORDER — CHERATUSSIN AC 100-10 MG/5ML PO SOLN: 5.0000 mL | Freq: Three times a day (TID) | ORAL | 0 refills | Status: DC | PRN

## 2021-05-08 MED ORDER — TRIAMCINOLONE ACETONIDE 40 MG/ML IJ SUSP
80.0000 mg | Freq: Once | INTRAMUSCULAR | Status: AC
  Administered 2021-05-08: 11:00:00 80 mg via INTRA_ARTICULAR

## 2021-05-08 MED ORDER — DOXYCYCLINE HYCLATE 100 MG PO TABS: 100.0000 mg | ORAL_TABLET | Freq: Two times a day (BID) | ORAL | 0 refills | Status: DC

## 2021-05-08 NOTE — Chronic Care Management (AMB) (Signed)
Chronic Care Management Pharmacy Assistant   Name: Alexandra [name]  MRN: [id number] DOB: 07-18-1940   Reason for Encounter: Disease State call for HTN    Recent office visits:  05/07/21 Reinaldo Meeker MD. Seen for Cough. Started on Doxycycline Hyclate 100 mg 2 times daily. Started guaifenesin-Codeine 100-10mg /94ml 3 times daily.   04/17/21 Reinaldo Meeker MD. Seen for Ulcer. Completed course of Cephalexin 500 mg 2 times daily.   04/11/21  Reinaldo Meeker MD. Seen for Ulcer..Started on Furosemide 40 mg daily.   04/04/21 Reinaldo Meeker MD. Seen for HTN. Started on Cephalexin 500 2 times daily. Started on Silver Sulfadiazine 1% 1 application topical daily. Completed course of Amoxicillin 875-125 mg 2 times daily, Guaifenesin-Codeine 100-10mg /53ml 3 times daily, Levofloxacin 500 mg daily, and oxycodone 5 mg.   Recent consult visits:  None   Hospital visits:  None   Medications: Outpatient Encounter Medications as of 05/08/2021  Medication Sig   amLODipine (NORVASC) 10 MG tablet TAKE 1 TABLET(10 MG) BY MOUTH EVERY MORNING   Budeson-Glycopyrrol-Formoterol (BREZTRI AEROSPHERE) 160-9-4.8 MCG/ACT AERO Inhale 2 puffs into the lungs in the morning and at bedtime.   diclofenac Sodium (VOLTAREN) 1 % GEL Apply 4 g topically 4 (four) times daily.   doxycycline (VIBRA-TABS) 100 MG tablet Take 1 tablet (100 mg total) by mouth 2 (two) times daily.   esomeprazole (NEXIUM) 40 MG capsule Take 40 mg by mouth daily as needed.    furosemide (LASIX) 40 MG tablet Take 1 tablet (40 mg total) by mouth daily.   guaiFENesin-codeine (CHERATUSSIN AC) 100-10 MG/5ML syrup Take 5 mLs by mouth 3 (three) times daily as needed.   rOPINIRole (REQUIP) 2 MG tablet Take 1 tablet (2 mg total) by mouth in the morning and at bedtime.   silver sulfADIAZINE (SILVADENE) 1 % cream Apply 1 application topically daily.   simvastatin (ZOCOR) 40 MG tablet Take 1 tablet (40 mg total) by mouth daily.   No facility-administered  encounter medications on file as of 05/08/2021.     Recent Office Vitals: BP Readings from Last 3 Encounters:  05/08/21 120/70  04/17/21 110/70  04/11/21 112/70   Pulse Readings from Last 3 Encounters:  05/08/21 85  04/17/21 97  04/11/21 98    Wt Readings from Last 3 Encounters:  05/08/21 157 lb (71.2 kg)  04/17/21 157 lb (71.2 kg)  04/11/21 160 lb 3.2 oz (72.7 kg)     Kidney Function Lab Results  Component Value Date/Time   CREATININE 1.69 (H) 04/04/2021 08:09 AM   CREATININE 1.97 (H) 01/18/2021 10:13 AM   GFRNONAA 25 (L) 03/28/2020 07:53 AM   GFRAA 29 (L) 03/28/2020 07:53 AM    BMP Latest Ref Rng & Units 04/04/2021 01/18/2021 09/26/2020  Glucose 70 - 99 mg/dL 110(H) 119(H) 93  BUN 8 - 27 mg/dL 26 19 26   Creatinine 0.57 - 1.00 mg/dL 1.69(H) 1.97(H) 1.81(H)  BUN/Creat Ratio 12 - 28 15 10(L) 14  Sodium 134 - 144 mmol/L 140 142 143  Potassium 3.5 - 5.2 mmol/L 5.0 4.5 5.0  Chloride 96 - 106 mmol/L 103 105 105  CO2 20 - 29 mmol/L 22 17(L) 21  Calcium 8.7 - 10.3 mg/dL 9.8 9.7 9.1     Current antihypertensive regimen:  Amlodipine 10 mg daily Furosemide 40 mg  daily   Patient verbally confirms she is taking the above medications as directed. Yes  How often are you checking your Blood Pressure? daily  she checks her blood pressure in the morning  before taking her medication.  Current home BP readings: 05/08/21 120/70  Wrist or arm cuff: Arm   Any readings above 180/120? No  What recent interventions/DTPs have been made by any provider to improve Blood Pressure control since last CPP Visit: Pt stated no changes   Any recent hospitalizations or ED visits since last visit with CPP?  Pt was in the urgent care for flu   What diet changes have been made to improve Blood Pressure Control?  Pt stated she hasn't been eating a lot since she has been sick   What exercise is being done to improve your Blood Pressure Control?  No exercise currently since she has the flu    Adherence Review: Is the patient currently on ACE/ARB medication? Yes Does the patient have >5 day gap between last estimated fill dates? No  Care Gaps: Last annual wellness visit? None noted   Star Rating Drugs:  Medication:  Last Fill: Day Supply Simvastatin   04/17/21 [place], [place] Pharmacist Assistant  [phone]

## 2021-05-08 NOTE — Progress Notes (Signed)
Acute Office Visit  Subjective:    Patient ID: Alexandra [name], female    DOB: 08-Mar-1941, 80 y.o.   MRN: [id number]  Chief Complaint  Patient presents with   Cough   Nasal Congestion   Influenza    HPI: Patient is in today for cough, chest congestion. Patient went to Urgent Care last Friday and she was diagnosed with Influenza. They gave her oseltamvir 75 mg and ventolin inhaler. She still taking the medication, but she still having some symptoms.  She is concerned to have pneumonia. She does not like ill.  Cough at night.  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   Restless legs syndrome     Past Surgical History:  Procedure Laterality Date   ABDOMINAL HYSTERECTOMY     complete   BACK SURGERY  2016   lower L 4 to L 5   bladder tack and uterus lift  10/10/2015   IR RADIOLOGIST EVAL & MGMT  12/03/2018   IR RADIOLOGIST EVAL & MGMT  12/31/2018   left nephrectomy  1999   PARATHYROIDECTOMY Left 07/02/2016   Procedure: LEFT INFERIOR PARATHYROIDECTOMY;  Surgeon: Armandina Gemma, MD;  Location: [facility];  Service: General;  Laterality: Left;   SHOULDER OPEN ROTATOR CUFF REPAIR  12/2019    Family History  Problem Relation Age of Onset   Emphysema Mother     Social History   Socioeconomic History   Marital status: Married    Spouse name: Not on file   Number of children: Not on file   Years of education: Not on file   Highest education level: Not on file  Occupational History   Not on file  Tobacco Use   Smoking status: Former    Packs/day: 1.00    Years: 50.00    Pack years: 50.00    Types: Cigarettes    Quit date: 06/11/2005    Years since quitting: 15.9   Smokeless tobacco: Never  Vaping Use   Vaping Use: Never used  Substance and Sexual Activity   Alcohol use: No   Drug use: No   Sexual activity: Yes    Partners: Male  Other Topics Concern   Not on file  Social History Narrative   Not on file   Social Determinants  of Health   Financial Resource Strain: Not on file  Food Insecurity: Not on file  Transportation Needs: Not on file  Physical Activity: Not on file  Stress: Not on file  Social Connections: Not on file  Intimate Partner Violence: Not on file    Outpatient Medications Prior to Visit  Medication Sig Dispense Refill   amLODipine (NORVASC) 10 MG tablet TAKE 1 TABLET(10 MG) BY MOUTH EVERY MORNING 90 tablet 2   Budeson-Glycopyrrol-Formoterol (BREZTRI AEROSPHERE) 160-9-4.8 MCG/ACT AERO Inhale 2 puffs into the lungs in the morning and at bedtime. 10.7 g 6   diclofenac Sodium (VOLTAREN) 1 % GEL Apply 4 g topically 4 (four) times daily. 4 g 0   esomeprazole (NEXIUM) 40 MG capsule Take 40 mg by mouth daily as needed.      furosemide (LASIX) 40 MG tablet Take 1 tablet (40 mg total) by mouth daily. 30 tablet 3   rOPINIRole (REQUIP) 2 MG tablet Take 1 tablet (2 mg total) by mouth in the morning and at bedtime. 180 tablet 6   silver sulfADIAZINE (SILVADENE) 1 % cream Apply 1 application topically daily. 50 g 2   simvastatin (ZOCOR) 40 MG  tablet Take 1 tablet (40 mg total) by mouth daily. 90 tablet 2   No facility-administered medications prior to visit.    Allergies  Allergen Reactions   Contrast Media [Iodinated Diagnostic Agents] Hives, Itching and Rash   Shellfish Allergy Nausea And Vomiting   Shellfish-Derived Products Nausea And Vomiting    Review of Systems  Constitutional:  Negative for chills, fatigue and fever.  HENT:  Positive for congestion. Negative for ear pain, sinus pain and sore throat.   Respiratory:  Positive for cough and shortness of breath. Negative for chest tightness.   Cardiovascular:  Negative for chest pain.  Gastrointestinal:  Negative for abdominal pain, diarrhea, nausea and vomiting.  Musculoskeletal:  Negative for myalgias.  Neurological:  Negative for headaches.      Objective:    Physical Exam  BP 120/70   Pulse 85   Temp 98.2 F (36.8 C)   Resp 16    Ht '5\' 4"'  (1.626 m)   Wt 157 lb (71.2 kg)   SpO2 94%   BMI 26.95 kg/m  Wt Readings from Last 3 Encounters:  05/08/21 157 lb (71.2 kg)  04/17/21 157 lb (71.2 kg)  04/11/21 160 lb 3.2 oz (72.7 kg)    Health Maintenance Due  Topic Date Due   TETANUS/TDAP  Never done   DEXA SCAN  Never done   Pneumonia Vaccine 27+ Years old (2 - PPSV23 if available, else PCV20) 03/27/2018   COVID-19 Vaccine (3 - Moderna risk series) 12/29/2020    There are no preventive care reminders to display for this patient.   No results found for: TSH Lab Results  Component Value Date   WBC 13.9 (H) 04/04/2021   HGB 12.0 04/04/2021   HCT 37.4 04/04/2021   MCV 94 04/04/2021   PLT 367 04/04/2021   Lab Results  Component Value Date   NA 140 04/04/2021   K 5.0 04/04/2021   CO2 22 04/04/2021   GLUCOSE 110 (H) 04/04/2021   BUN 26 04/04/2021   CREATININE 1.69 (H) 04/04/2021   BILITOT 0.3 04/04/2021   ALKPHOS 96 04/04/2021   AST 17 04/04/2021   ALT 15 04/04/2021   PROT 6.9 04/04/2021   ALBUMIN 4.2 04/04/2021   CALCIUM 9.8 04/04/2021   ANIONGAP 5 06/26/2016   EGFR 31 (L) 04/04/2021   Lab Results  Component Value Date   CHOL 147 04/04/2021   Lab Results  Component Value Date   HDL 63 04/04/2021   Lab Results  Component Value Date   LDLCALC 64 04/04/2021   Lab Results  Component Value Date   TRIG 113 04/04/2021   Lab Results  Component Value Date   CHOLHDL 2.3 04/04/2021   Lab Results  Component Value Date   HGBA1C 5.6 04/04/2021       Assessment & Plan:   Problem List Items Addressed This Visit       Respiratory   Bronchopneumonia   Relevant Medications   doxycycline (VIBRA-TABS) 100 MG tablet   guaiFENesin-codeine (CHERATUSSIN AC) 100-10 MG/5ML syrup   Other Relevant Orders   DG Chest 2 View Treat with doxycycline and cheratussin   Other Visit Diagnoses     Cough, unspecified type    -  Primary   Relevant Medications   doxycycline (VIBRA-TABS) 100 MG tablet    guaiFENesin-codeine (CHERATUSSIN AC) 100-10 MG/5ML syrup   Other Relevant Orders   POC COVID-19 BinaxNow (Completed) neg   POCT Influenza A/B (Completed) neg Cheratussin for cough   Nasal congestion  Relevant Orders   POC COVID-19 BinaxNow (Completed)    Nasal congestion, use antibiotics   Influenza A       Relevant Medications   doxycycline (VIBRA-TABS) 100 MG tablet   guaiFENesin-codeine (CHERATUSSIN AC) 100-10 MG/5ML syrup   Other Relevant Orders   DG Chest 2 View Negative influenza      Meds ordered this encounter  Medications   doxycycline (VIBRA-TABS) 100 MG tablet    Sig: Take 1 tablet (100 mg total) by mouth 2 (two) times daily.    Dispense:  20 tablet    Refill:  0   guaiFENesin-codeine (CHERATUSSIN AC) 100-10 MG/5ML syrup    Sig: Take 5 mLs by mouth 3 (three) times daily as needed.    Dispense:  120 mL    Refill:  0   triamcinolone acetonide (KENALOG-40) injection 80 mg     Orders Placed This Encounter  Procedures   DG Chest 2 View   POC COVID-19 BinaxNow   POCT Influenza A/B     Follow-up: Return if symptoms worsen or fail to improve.  An After Visit Summary was printed and given to the patient.  Reinaldo Meeker, MD [facility] [phone]

## 2021-05-19 DIAGNOSIS — Z20822 Contact with and (suspected) exposure to covid-19: Secondary | ICD-10-CM | POA: Diagnosis not present

## 2021-06-14 ENCOUNTER — Encounter: Payer: Self-pay | Admitting: Legal Medicine

## 2021-06-19 DIAGNOSIS — Z20822 Contact with and (suspected) exposure to covid-19: Secondary | ICD-10-CM | POA: Diagnosis not present

## 2021-06-27 DIAGNOSIS — Z20828 Contact with and (suspected) exposure to other viral communicable diseases: Secondary | ICD-10-CM | POA: Diagnosis not present

## 2021-07-03 ENCOUNTER — Telehealth: Payer: Self-pay

## 2021-07-03 NOTE — Chronic Care Management (AMB) (Signed)
° ° °  Chronic Care Management Pharmacy Assistant   Name: Alexandra [name]  MRN: [id number] DOB: December 06, 1940  Reason for Encounter: Disease State call for COPD   Recent office visits:  None  Recent consult visits:  None  Hospital visits:  None  Medications: Outpatient Encounter Medications as of 07/03/2021  Medication Sig   amLODipine (NORVASC) 10 MG tablet TAKE 1 TABLET(10 MG) BY MOUTH EVERY MORNING   Budeson-Glycopyrrol-Formoterol (BREZTRI AEROSPHERE) 160-9-4.8 MCG/ACT AERO Inhale 2 puffs into the lungs in the morning and at bedtime.   diclofenac Sodium (VOLTAREN) 1 % GEL Apply 4 g topically 4 (four) times daily.   doxycycline (VIBRA-TABS) 100 MG tablet Take 1 tablet (100 mg total) by mouth 2 (two) times daily.   esomeprazole (NEXIUM) 40 MG capsule Take 40 mg by mouth daily as needed.    furosemide (LASIX) 40 MG tablet Take 1 tablet (40 mg total) by mouth daily.   guaiFENesin-codeine (CHERATUSSIN AC) 100-10 MG/5ML syrup Take 5 mLs by mouth 3 (three) times daily as needed.   rOPINIRole (REQUIP) 2 MG tablet Take 1 tablet (2 mg total) by mouth in the morning and at bedtime.   silver sulfADIAZINE (SILVADENE) 1 % cream Apply 1 application topically daily.   simvastatin (ZOCOR) 40 MG tablet Take 1 tablet (40 mg total) by mouth daily.   No facility-administered encounter medications on file as of 07/03/2021.    Current COPD regimen:  Breztri Inhaler 2 puffs in am and pm (PAP)   Question: Pt stated this inhaler makes her nervous. She stated she will be at church and her hands will just be shaking and wants to know if this is normal using this inhaler? She is not fond of this symptom and wants to know what to do.   Any recent hospitalizations or ED visits since last visit with CPP? Yes  denies COPD symptoms   What recent interventions/DTPs have been made by any provider to improve breathing since last visit: Pt denies any changes    Have you had exacerbation/flare-up since last visit?  No  What do you do when you are short of breath?  Rest  Current tobacco use? No  Respiratory Devices/Equipment Do you have a nebulizer? No Do you use a Peak Flow Meter? No Do you use a maintenance inhaler? Yes How often do you forget to use your daily inhaler? Never Do you use a rescue inhaler? No How often do you use your rescue inhaler?  Pt only uses Breztri  Do you use a spacer with your inhaler? No  Adherence Review: Does the patient have >5 day gap between last estimated fill date for maintenance inhaler medications? CPP to review  Care Gaps: Last annual wellness visit? None noted  Elray Mcgregor, [place] Pharmacist Assistant  [phone]

## 2021-07-04 NOTE — Telephone Encounter (Signed)
Decrease inhaler to one puff bid due to jitteriness lp

## 2021-07-04 NOTE — Telephone Encounter (Signed)
Patient is experiencing jitteriness and shaking during the day, she believes it is due to her inhaler. Will ask PCP to decrease from 2BID to 1BID.

## 2021-07-10 ENCOUNTER — Other Ambulatory Visit: Payer: Self-pay | Admitting: Legal Medicine

## 2021-07-10 DIAGNOSIS — R609 Edema, unspecified: Secondary | ICD-10-CM

## 2021-07-10 MED ORDER — TORSEMIDE 20 MG PO TABS: 20.0000 mg | ORAL_TABLET | Freq: Every day | ORAL | 3 refills | Status: DC

## 2021-07-12 ENCOUNTER — Other Ambulatory Visit: Payer: Self-pay

## 2021-07-12 DIAGNOSIS — I10 Essential (primary) hypertension: Secondary | ICD-10-CM

## 2021-07-18 ENCOUNTER — Other Ambulatory Visit: Payer: Self-pay

## 2021-07-18 ENCOUNTER — Ambulatory Visit (INDEPENDENT_AMBULATORY_CARE_PROVIDER_SITE_OTHER): Payer: Medicare Other

## 2021-07-18 DIAGNOSIS — I10 Essential (primary) hypertension: Secondary | ICD-10-CM

## 2021-07-18 DIAGNOSIS — E782 Mixed hyperlipidemia: Secondary | ICD-10-CM

## 2021-07-18 NOTE — Progress Notes (Signed)
Chronic Care Management Pharmacy Note  07/18/2021 Name:  Alexandra [name] MRN:  [id number] DOB:  [birth year]  Summary: -Patient driving, unable to go too in-depth -Breztri PAP approved  Subjective: Alexandra [name] is an 81 y.o. year old female who is a primary patient of Henrene Pastor, Zeb Comfort, MD.  The CCM team was consulted for assistance with disease management and care coordination needs.    Engaged with patient by telephone for follow up visit in response to provider referral for pharmacy case management and/or care coordination services.   Consent to Services:  The patient was given the following information about Chronic Care Management services today, agreed to services, and gave verbal consent: 1. CCM service includes personalized support from designated clinical staff supervised by the primary care provider, including individualized plan of care and coordination with other care providers 2. 24/7 contact phone numbers for assistance for urgent and routine care needs. 3. Service will only be billed when office clinical staff spend 20 minutes or more in a month to coordinate care. 4. Only one practitioner may furnish and bill the service in a calendar month. 5.The patient may stop CCM services at any time (effective at the end of the month) by phone call to the office staff. 6. The patient will be responsible for cost sharing (co-pay) of up to 20% of the service fee (after annual deductible is met). Patient agreed to services and consent obtained.  Patient Care Team: [name], MD as PCP - General (Family Medicine) Lane Hacker, [facility] (Pharmacist)  Recent office visits:  None   Recent consult visits:  None   Hospital visits:  None   Objective:  Lab Results  Component Value Date   CREATININE 1.69 (H) 04/04/2021   BUN 26 04/04/2021   EGFR 31 (L) 04/04/2021   GFRNONAA 25 (L) 03/28/2020   GFRAA 29 (L) 03/28/2020   NA 140 04/04/2021   K 5.0 04/04/2021    CALCIUM 9.8 04/04/2021   CO2 22 04/04/2021   GLUCOSE 110 (H) 04/04/2021    Lab Results  Component Value Date/Time   HGBA1C 5.6 04/04/2021 08:09 AM    Last diabetic Eye exam: No results found for: HMDIABEYEEXA  Last diabetic Foot exam: No results found for: HMDIABFOOTEX   Lab Results  Component Value Date   CHOL 147 04/04/2021   HDL 63 04/04/2021   LDLCALC 64 04/04/2021   TRIG 113 04/04/2021   CHOLHDL 2.3 04/04/2021    Hepatic Function Latest Ref Rng & Units 04/04/2021 01/18/2021 09/26/2020  Total Protein 6.0 - 8.5 g/dL 6.9 6.1 6.3  Albumin 3.7 - 4.7 g/dL 4.2 3.5(L) 4.0  AST 0 - 40 IU/L '17 30 16  ' ALT 0 - 32 IU/L 15 122(H) 19  Alk Phosphatase 44 - 121 IU/L 96 151(H) 94  Total Bilirubin 0.0 - 1.2 mg/dL 0.3 0.3 0.2    No results found for: TSH, FREET4  CBC Latest Ref Rng & Units 04/04/2021 09/26/2020 03/28/2020  WBC 3.4 - 10.8 x10E3/uL 13.9(H) 9.2 10.9(H)  Hemoglobin 11.1 - 15.9 g/dL 12.0 11.7 11.8  Hematocrit 34.0 - 46.6 % 37.4 35.7 36.3  Platelets 150 - 450 x10E3/uL 367 377 432    No results found for: VD25OH  Clinical ASCVD: Yes  The 10-year ASCVD risk score (Arnett DK, et al., 2019) is: 25.9%   Values used to calculate the score:     Age: 55 years     Sex: Female     Is Non-Hispanic African  American: No     Diabetic: No     Tobacco smoker: No     Systolic Blood Pressure: 540 mmHg     Is BP treated: Yes     HDL Cholesterol: 63 mg/dL     Total Cholesterol: 147 mg/dL    Depression screen [facility] 2/9 04/04/2021 03/07/2021 09/26/2020  Decreased Interest 0 0 0  Down, Depressed, Hopeless 0 0 0  PHQ - 2 Score 0 0 0     Other: (CHADS2VASc if Afib, MMRC or CAT for COPD, ACT, DEXA)  Social History   Tobacco Use  Smoking Status Former   Packs/day: 1.00   Years: 50.00   Pack years: 50.00   Types: Cigarettes   Quit date: 06/11/2005   Years since quitting: 16.1  Smokeless Tobacco Never   BP Readings from Last 3 Encounters:  05/08/21 120/70  04/17/21 110/70  04/11/21  112/70   Pulse Readings from Last 3 Encounters:  05/08/21 85  04/17/21 97  04/11/21 98   Wt Readings from Last 3 Encounters:  05/08/21 157 lb (71.2 kg)  04/17/21 157 lb (71.2 kg)  04/11/21 160 lb 3.2 oz (72.7 kg)   BMI Readings from Last 3 Encounters:  05/08/21 26.95 kg/m  04/17/21 26.95 kg/m  04/11/21 27.50 kg/m    Assessment/Interventions: Review of patient past medical history, allergies, medications, health status, including review of consultants reports, laboratory and other test data, was performed as part of comprehensive evaluation and provision of chronic care management services.   SDOH:  (Social Determinants of Health) assessments and interventions performed: Yes SDOH Interventions    Flowsheet Row Most Recent Value  SDOH Interventions   Financial Strain Interventions Other (Comment)  [PAP (See CP)]  Transportation Interventions Intervention Not Indicated      SDOH Screenings   Alcohol Screen: Low Risk    Last Alcohol Screening Score (AUDIT): 0  Depression (PHQ2-9): Low Risk    PHQ-2 Score: 0  Financial Resource Strain: High Risk   Difficulty of Paying Living Expenses: Hard  Food Insecurity: Not on file  Housing: Not on file  Physical Activity: Not on file  Social Connections: Not on file  Stress: Not on file  Tobacco Use: Medium Risk   Smoking Tobacco Use: Former   Smokeless Tobacco Use: Never   Passive Exposure: Not on file  Transportation Needs: No Transportation Needs   Lack of Transportation (Medical): No   Lack of Transportation (Non-Medical): No    CCM Care Plan  Allergies  Allergen Reactions   Contrast Media [Iodinated Contrast Media] Hives, Itching and Rash   Shellfish Allergy Nausea And Vomiting   Shellfish-Derived Products Nausea And Vomiting    Medications Reviewed Today     Reviewed by [name], MD (Physician) on 05/08/21 at [place] List Status: <None>   Medication Order Taking? Sig Documenting Provider Last  Dose Status Informant  amLODipine (NORVASC) 10 MG tablet [id number] Yes TAKE 1 TABLET(10 MG) BY MOUTH EVERY MORNING [name], MD Taking Active   Budeson-Glycopyrrol-Formoterol (BREZTRI AEROSPHERE) 160-9-4.8 MCG/ACT Hollie Salk [id number] Yes Inhale 2 puffs into the lungs in the morning and at bedtime. [name], MD Taking Active   diclofenac Sodium (VOLTAREN) 1 % GEL [id number] Yes Apply 4 g topically 4 (four) times daily. Rip Harbour, NP Taking Active   esomeprazole (NEXIUM) 40 MG capsule [id number] Yes Take 40 mg by mouth daily as needed.  [provider] Taking Active   furosemide (LASIX) 40 MG tablet [id number]  Yes Take 1 tablet (40 mg total) by mouth daily. [name], MD Taking Active   rOPINIRole (REQUIP) 2 MG tablet [id number] Yes Take 1 tablet (2 mg total) by mouth in the morning and at bedtime. [name], MD Taking Active   silver sulfADIAZINE (SILVADENE) 1 % cream [id number] Yes Apply 1 application topically daily. [name], MD Taking Active   simvastatin (ZOCOR) 40 MG tablet [id number] Yes Take 1 tablet (40 mg total) by mouth daily. 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   Chronic kidney disease, stage 3a ([place]) 08/03/2019   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   Bladder cancer ([place]) 08/05/2018   H/O left nephrectomy 08/05/2018   COPD, moderate ([place]) 03/20/2018   Ex-smoker 01/08/2018   Essential hypertension 02/27/2016   Hyperlipemia 02/27/2016   History of kidney cancer 02/27/2016   Age related osteoporosis 02/27/2016   Spinal stenosis 02/27/2016    Immunization History  Administered Date(s)  Administered   Fluad Quad(high Dose 65+) 03/25/2019, 02/17/2020, 04/04/2021   Influenza, High Dose Seasonal PF 03/27/2017, 04/01/2018   Moderna SARS-COV2 Booster Vaccination 04/12/2020, 12/01/2020   Moderna Sars-Covid-2 Vaccination 06/26/2019, 07/24/2019   Pneumococcal Conjugate-13 10/15/2013, 03/27/2017   Pneumococcal-Unspecified 10/15/2013   Zoster, Live 11/23/2010    Conditions to be addressed/monitored:  Hypertension and Hyperlipidemia  Care Plan : [place]  Updates made by Lane Hacker, RPH since 07/18/2021 12:00 AM     Problem: htn, hld, copd   Priority: High  Onset Date: 01/31/2021     Goal: Patient-Specific Goal   Start Date: 01/31/2021  Expected End Date: 01/31/2022  Recent Progress: On track  Priority: High  Note:    Current Barriers:  Unable to independently afford treatment regimen  Pharmacist Clinical Goal(s):  Patient will verbalize ability to afford treatment regimen through collaboration with PharmD and provider.   Interventions: 1:1 collaboration with [name], MD regarding development and update of comprehensive plan of care as evidenced by provider attestation and co-signature Inter-disciplinary care team collaboration (see longitudinal plan of care) Comprehensive medication review performed; medication list updated in electronic medical record  Hypertension (BP goal <140/90) BP Readings from Last 3 Encounters:  05/08/21 120/70  04/17/21 110/70  04/11/21 112/70  -Controlled -Current treatment: Amlodipine 10 mg daily am Appropriate, Effective, Safe, Accessible -Medications previously tried: none reported at this time -Current home readings: reports it was 110/70s at surgeon follow-up -Current dietary habits: limited since gall bladder removal. Trying to find out what she can tolerate. Cooking makes her sick right now.  -Current exercise habits: moving around the house as she recovers.  -Denies hypotensive/hypertensive  symptoms -Educated on BP goals and benefits of medications for prevention of heart attack, stroke and kidney damage; Daily salt intake goal < 2300 mg; Exercise goal of 150 minutes per week; -Counseled to monitor BP at home as needed, document, and provide log at future appointments Feb 2023: Patient driving and didn't have meds today, unable to go to in-depth  Hyperlipidemia: (LDL goal < 100) The 10-year ASCVD risk score (Arnett DK, et al., 2019) is: 25.9%   Values used to calculate the score:  Age: 30 years     Sex: Female     Is Non-Hispanic African American: No     Diabetic: No     Tobacco smoker: No     Systolic Blood Pressure: 982 mmHg     Is BP treated: Yes     HDL Cholesterol: 63 mg/dL     Total Cholesterol: 147 mg/dL Lab Results  Component Value Date   CHOL 147 04/04/2021   CHOL 144 09/26/2020   CHOL 149 03/28/2020   Lab Results  Component Value Date   HDL 63 04/04/2021   HDL 54 09/26/2020   HDL 50 03/28/2020   Lab Results  Component Value Date   LDLCALC 64 04/04/2021   LDLCALC 73 09/26/2020   LDLCALC 77 03/28/2020   Lab Results  Component Value Date   TRIG 113 04/04/2021   TRIG 90 09/26/2020   TRIG 122 03/28/2020   Lab Results  Component Value Date   CHOLHDL 2.3 04/04/2021   CHOLHDL 2.7 09/26/2020   CHOLHDL 3.0 03/28/2020  No results found for: LDLDIRECT -Controlled -Current treatment: simvastatin 40 mg daily Appropriate, Effective, Safe, Accessible -Medications previously tried: none reported  -Current dietary habits: limited since gall bladder removal. Trying to find out what she can tolerate. Cooking makes her sick right now.  -Current exercise habits: moving around the house as she recovers.  -Denies hypotensive/hypertensive symptoms -Educated on Cholesterol goals;  Benefits of statin for ASCVD risk reduction; Exercise goal of 150 minutes per week; -Counseled on diet and exercise extensively Feb 2023: Patient driving and didn't have meds  today, unable to go to in-depth  Med Management -Patient states her Amlodipine is $7-$18 -[place]: [id number] PCN: [id number] Grp: [id number] ID: [id number]  Patient Goals/Self-Care Activities Patient will:  - take medications as prescribed focus on medication adherence by using pill box target a minimum of 150 minutes of moderate intensity exercise weekly engage in dietary modifications by working to get nutrients in diet needed since gall bladder surgery.   Follow Up Plan: Telephone follow up appointment with care management team member scheduled for: 09/2021 with pharmacist   [place] Constable, Pharm.D. - [phone]        Medication Assistance:  Breztriobtained through PAP medication assistance program.  Enrollment ends Dec 2023  Compliance/Adherence/Medication fill history: Care Gaps: Last annual wellness visit? None noted  Patient's preferred pharmacy is:  [facility] [place], [place] - [address] AT [place] [address] [place] [address] Phone: [phone] Fax: [phone]  [place], [place] [place] [place] [address] Phone: [phone] Fax: [phone]  Uses pill box? No -   Pt endorses 100% compliance  Care Plan and Follow Up Patient Decision:  Patient agrees to Care Plan and Follow-up.  Plan: The patient has been provided with contact information for the care management team and has been advised to call with any health related questions or concerns.   CPP F/U April 2023  Ari[place] Constable, Sherian Rein.D. - [phone]

## 2021-07-18 NOTE — Patient Instructions (Signed)
Visit Information   Goals Addressed   None    Patient Care Plan: CCM Pharmacy Care Plan     Problem Identified: htn, hld, copd   Priority: High  Onset Date: 01/31/2021     Goal: Patient-Specific Goal   Start Date: 01/31/2021  Expected End Date: 01/31/2022  Recent Progress: On track  Priority: High  Note:    Current Barriers:  Unable to independently afford treatment regimen  Pharmacist Clinical Goal(s):  Patient will verbalize ability to afford treatment regimen through collaboration with PharmD and provider.   Interventions: 1:1 collaboration with [name], MD regarding development and update of comprehensive plan of care as evidenced by provider attestation and co-signature Inter-disciplinary care team collaboration (see longitudinal plan of care) Comprehensive medication review performed; medication list updated in electronic medical record  Hypertension (BP goal <140/90) BP Readings from Last 3 Encounters:  05/08/21 120/70  04/17/21 110/70  04/11/21 112/70  -Controlled -Current treatment: Amlodipine 10 mg daily am Appropriate, Effective, Safe, Accessible -Medications previously tried: none reported at this time -Current home readings: reports it was 110/70s at surgeon follow-up -Current dietary habits: limited since gall bladder removal. Trying to find out what she can tolerate. Cooking makes her sick right now.  -Current exercise habits: moving around the house as she recovers.  -Denies hypotensive/hypertensive symptoms -Educated on BP goals and benefits of medications for prevention of heart attack, stroke and kidney damage; Daily salt intake goal < 2300 mg; Exercise goal of 150 minutes per week; -Counseled to monitor BP at home as needed, document, and provide log at future appointments Feb 2023: Patient driving and didn't have meds today, unable to go to in-depth  Hyperlipidemia: (LDL goal < 100) The 10-year ASCVD risk score (Arnett DK, et al.,  2019) is: 25.9%   Values used to calculate the score:     Age: 81 years     Sex: Female     Is Non-Hispanic African American: No     Diabetic: No     Tobacco smoker: No     Systolic Blood Pressure: 941 mmHg     Is BP treated: Yes     HDL Cholesterol: 63 mg/dL     Total Cholesterol: 147 mg/dL Lab Results  Component Value Date   CHOL 147 04/04/2021   CHOL 144 09/26/2020   CHOL 149 03/28/2020   Lab Results  Component Value Date   HDL 63 04/04/2021   HDL 54 09/26/2020   HDL 50 03/28/2020   Lab Results  Component Value Date   LDLCALC 64 04/04/2021   LDLCALC 73 09/26/2020   LDLCALC 77 03/28/2020   Lab Results  Component Value Date   TRIG 113 04/04/2021   TRIG 90 09/26/2020   TRIG 122 03/28/2020   Lab Results  Component Value Date   CHOLHDL 2.3 04/04/2021   CHOLHDL 2.7 09/26/2020   CHOLHDL 3.0 03/28/2020  No results found for: LDLDIRECT -Controlled -Current treatment: simvastatin 40 mg daily Appropriate, Effective, Safe, Accessible -Medications previously tried: none reported  -Current dietary habits: limited since gall bladder removal. Trying to find out what she can tolerate. Cooking makes her sick right now.  -Current exercise habits: moving around the house as she recovers.  -Denies hypotensive/hypertensive symptoms -Educated on Cholesterol goals;  Benefits of statin for ASCVD risk reduction; Exercise goal of 150 minutes per week; -Counseled on diet and exercise extensively Feb 2023: Patient driving and didn't have meds today, unable to go to in-depth  Med Management -Patient states  her Amlodipine is $7-$18 -[place]: [phone] PCN: [id number] Grp: [id number] ID: [id number]  Patient Goals/Self-Care Activities Patient will:  - take medications as prescribed focus on medication adherence by using pill box target a minimum of 150 minutes of moderate intensity exercise weekly engage in dietary modifications by working to get nutrients in diet needed since  gall bladder surgery.   Follow Up Plan: Telephone follow up appointment with care management team member scheduled for: 09/2021 with pharmacist   [place] Constable, Pharm.D. - [phone]       Ms. Woodroof was given information about Chronic Care Management services today including:  CCM service includes personalized support from designated clinical staff supervised by her physician, including individualized plan of care and coordination with other care providers 24/7 contact phone numbers for assistance for urgent and routine care needs. Standard insurance, coinsurance, copays and deductibles apply for chronic care management only during months in which we provide at least 20 minutes of these services. Most insurances cover these services at 100%, however patients may be responsible for any copay, coinsurance and/or deductible if applicable. This service may help you avoid the need for more expensive face-to-face services. Only one practitioner may furnish and bill the service in a calendar month. The patient may stop CCM services at any time (effective at the end of the month) by phone call to the office staff.  Patient agreed to services and verbal consent obtained.   The patient verbalized understanding of instructions, educational materials, and care plan provided today and declined offer to receive copy of patient instructions, educational materials, and care plan.  The pharmacy team will reach out to the patient again over the next 60 days.   Lane Hacker, [place]

## 2021-07-19 DIAGNOSIS — N2889 Other specified disorders of kidney and ureter: Secondary | ICD-10-CM | POA: Diagnosis not present

## 2021-07-19 DIAGNOSIS — C672 Malignant neoplasm of lateral wall of bladder: Secondary | ICD-10-CM | POA: Diagnosis not present

## 2021-07-20 ENCOUNTER — Other Ambulatory Visit: Payer: Self-pay

## 2021-07-20 ENCOUNTER — Other Ambulatory Visit: Payer: Medicare Other

## 2021-07-20 DIAGNOSIS — I10 Essential (primary) hypertension: Secondary | ICD-10-CM | POA: Diagnosis not present

## 2021-07-21 LAB — COMPREHENSIVE METABOLIC PANEL
ALT: 17 IU/L (ref 0–32)
AST: 20 IU/L (ref 0–40)
Albumin/Globulin Ratio: 1.9 (ref 1.2–2.2)
Albumin: 3.9 g/dL (ref 3.7–4.7)
Alkaline Phosphatase: 96 IU/L (ref 44–121)
BUN/Creatinine Ratio: 16 (ref 12–28)
BUN: 32 mg/dL — ABNORMAL HIGH (ref 8–27)
Bilirubin Total: 0.3 mg/dL (ref 0.0–1.2)
CO2: 19 mmol/L — ABNORMAL LOW (ref 20–29)
Calcium: 8.7 mg/dL (ref 8.7–10.3)
Chloride: 107 mmol/L — ABNORMAL HIGH (ref 96–106)
Creatinine, Ser: 2.02 mg/dL — ABNORMAL HIGH (ref 0.57–1.00)
Globulin, Total: 2.1 g/dL (ref 1.5–4.5)
Glucose: 93 mg/dL (ref 70–99)
Potassium: 5.1 mmol/L (ref 3.5–5.2)
Sodium: 142 mmol/L (ref 134–144)
Total Protein: 6 g/dL (ref 6.0–8.5)
eGFR: 25 mL/min/{1.73_m2} — ABNORMAL LOW (ref >59)

## 2021-07-21 NOTE — Progress Notes (Signed)
Kidney tests stage 4 stable, liver tests normal,  lp

## 2021-07-24 ENCOUNTER — Ambulatory Visit (INDEPENDENT_AMBULATORY_CARE_PROVIDER_SITE_OTHER): Payer: Medicare Other | Admitting: Legal Medicine

## 2021-07-24 ENCOUNTER — Encounter: Payer: Self-pay | Admitting: Legal Medicine

## 2021-07-24 ENCOUNTER — Other Ambulatory Visit: Payer: Self-pay

## 2021-07-24 DIAGNOSIS — M25552 Pain in left hip: Secondary | ICD-10-CM | POA: Diagnosis not present

## 2021-07-24 DIAGNOSIS — J449 Chronic obstructive pulmonary disease, unspecified: Secondary | ICD-10-CM | POA: Diagnosis not present

## 2021-07-24 DIAGNOSIS — C679 Malignant neoplasm of bladder, unspecified: Secondary | ICD-10-CM

## 2021-07-24 DIAGNOSIS — M7062 Trochanteric bursitis, left hip: Secondary | ICD-10-CM | POA: Insufficient documentation

## 2021-07-24 MED ORDER — HYDROCODONE-ACETAMINOPHEN 10-325 MG PO TABS: 1.0000 | ORAL_TABLET | Freq: Three times a day (TID) | ORAL | 0 refills | Status: AC | PRN

## 2021-07-24 NOTE — Progress Notes (Signed)
Acute Office Visit  Subjective:    Patient ID: Alexandra [name], female    DOB: 1940-12-11, 81 y.o.   MRN: [id number]  Chief Complaint  Patient presents with   Hip Pain    HPI: Patient is in today for left hip aching pain and soreness since on 07/24/2021. She mentioned that it is difficult to get up and walk for the pain. She has pain lying on left side at night.  No trauma, no radiation of pain.  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   Restless legs syndrome     Past Surgical History:  Procedure Laterality Date   ABDOMINAL HYSTERECTOMY     complete   BACK SURGERY  2016   lower L 4 to L 5   bladder tack and uterus lift  10/10/2015   IR RADIOLOGIST EVAL & MGMT  12/03/2018   IR RADIOLOGIST EVAL & MGMT  12/31/2018   left nephrectomy  1999   PARATHYROIDECTOMY Left 07/02/2016   Procedure: LEFT INFERIOR PARATHYROIDECTOMY;  Surgeon: Armandina Gemma, MD;  Location: [facility];  Service: General;  Laterality: Left;   SHOULDER OPEN ROTATOR CUFF REPAIR  12/2019    Family History  Problem Relation Age of Onset   Emphysema Mother     Social History   Socioeconomic History   Marital status: Married    Spouse name: Not on file   Number of children: Not on file   Years of education: Not on file   Highest education level: Not on file  Occupational History   Not on file  Tobacco Use   Smoking status: Former    Packs/day: 1.00    Years: 50.00    Pack years: 50.00    Types: Cigarettes    Quit date: 06/11/2005    Years since quitting: 16.1   Smokeless tobacco: Never  Vaping Use   Vaping Use: Never used  Substance and Sexual Activity   Alcohol use: No   Drug use: No   Sexual activity: Yes    Partners: Male  Other Topics Concern   Not on file  Social History Narrative   Not on file   Social Determinants of Health   Financial Resource Strain: High Risk   Difficulty of Paying Living Expenses: Hard   Food Insecurity: Not on file  Transportation Needs: No Transportation Needs   Lack of Transportation (Medical): No   Lack of Transportation (Non-Medical): No  Physical Activity: Not on file  Stress: Not on file  Social Connections: Not on file  Intimate Partner Violence: Not on file    Outpatient Medications Prior to Visit  Medication Sig Dispense Refill   amLODipine (NORVASC) 10 MG tablet TAKE 1 TABLET(10 MG) BY MOUTH EVERY MORNING 90 tablet 2   Budeson-Glycopyrrol-Formoterol (BREZTRI AEROSPHERE) 160-9-4.8 MCG/ACT AERO Inhale 2 puffs into the lungs in the morning and at bedtime. 10.7 g 6   diclofenac Sodium (VOLTAREN) 1 % GEL Apply 4 g topically 4 (four) times daily. 4 g 0   esomeprazole (NEXIUM) 40 MG capsule Take 40 mg by mouth daily as needed.      guaiFENesin-codeine (CHERATUSSIN AC) 100-10 MG/5ML syrup Take 5 mLs by mouth 3 (three) times daily as needed. 120 mL 0   rOPINIRole (REQUIP) 2 MG tablet Take 1 tablet (2 mg total) by mouth in the morning and at bedtime. 180 tablet 6   silver sulfADIAZINE (SILVADENE) 1 % cream Apply 1  application topically daily. 50 g 2   simvastatin (ZOCOR) 40 MG tablet Take 1 tablet (40 mg total) by mouth daily. 90 tablet 2   torsemide (DEMADEX) 20 MG tablet Take 1 tablet (20 mg total) by mouth daily. 30 tablet 3   VENTOLIN HFA 108 (90 Base) MCG/ACT inhaler SMARTSIG:1-2 Puff(s) By Mouth Every 4 Hours PRN     doxycycline (VIBRA-TABS) 100 MG tablet Take 1 tablet (100 mg total) by mouth 2 (two) times daily. 20 tablet 0   No facility-administered medications prior to visit.    Allergies  Allergen Reactions   Contrast Media [Iodinated Contrast Media] Hives, Itching and Rash   Shellfish Allergy Nausea And Vomiting   Shellfish-Derived Products Nausea And Vomiting    Review of Systems  Constitutional:  Negative for chills, fatigue and fever.  HENT:  Negative for congestion, ear pain and sore throat.   Respiratory:  Negative for cough and shortness of  breath.   Cardiovascular:  Negative for chest pain and palpitations.  Gastrointestinal:  Negative for abdominal pain, constipation, diarrhea, nausea and vomiting.  Endocrine: Negative for polydipsia, polyphagia and polyuria.  Genitourinary:  Negative for difficulty urinating and dysuria.  Musculoskeletal:  Positive for arthralgias (left hip pain). Negative for back pain and myalgias.  Skin:  Negative for rash.  Neurological:  Negative for headaches.  Psychiatric/Behavioral:  Negative for dysphoric mood. The patient is not nervous/anxious.       Objective:    Physical Exam Vitals reviewed.  Constitutional:      General: She is in acute distress.     Appearance: Normal appearance. She is obese.  HENT:     Head: Normocephalic.     Right Ear: Tympanic membrane, ear canal and external ear normal.     Left Ear: Tympanic membrane, ear canal and external ear normal.     Nose: Nose normal.     Mouth/Throat:     Mouth: Mucous membranes are moist.     Pharynx: Oropharynx is clear. No posterior oropharyngeal erythema.  Eyes:     Extraocular Movements: Extraocular movements intact.     Conjunctiva/sclera: Conjunctivae normal.     Pupils: Pupils are equal, round, and reactive to light.  Neck:     Vascular: No carotid bruit.  Cardiovascular:     Rate and Rhythm: Normal rate and regular rhythm.     Pulses: Normal pulses.     Heart sounds: Normal heart sounds. No murmur heard.   No gallop.  Pulmonary:     Effort: Pulmonary effort is normal. No respiratory distress.     Breath sounds: Normal breath sounds.  Abdominal:     General: Bowel sounds are normal.     Palpations: Abdomen is soft. There is no mass.  Musculoskeletal:        General: Tenderness (Left Hip) present. Normal range of motion.     Cervical back: Normal range of motion. No tenderness.     Right hip: Normal.     Left hip: Tenderness present.     Right lower leg: Edema (compression stocking) present.     Left lower leg:  Edema present.       Legs:     Comments: Pain left trochanteric bursa  Skin:    Capillary Refill: Capillary refill takes less than 2 seconds.  Neurological:     General: No focal deficit present.     Mental Status: She is alert and oriented to person, place, and time.     Gait: Gait  normal.     Deep Tendon Reflexes: Reflexes normal.  Psychiatric:        Mood and Affect: Mood normal.        Behavior: Behavior normal.        Thought Content: Thought content normal.    BP 120/80    Pulse 90    Temp 98.4 F (36.9 C)    Resp 16    Ht _0  (1.626 m)    Wt 165 lb (74.8 kg)    SpO2 99%    BMI 28.32 kg/m  Wt Readings from Last 3 Encounters:  07/24/21 165 lb (74.8 kg)  05/08/21 157 lb (71.2 kg)  04/17/21 157 lb (71.2 kg)    Health Maintenance Due  Topic Date Due   TETANUS/TDAP  Never done   Zoster Vaccines- Shingrix (1 of 2) Never done   DEXA SCAN  Never done   Pneumonia Vaccine 91+ Years old (2 - PPSV23 if available, else PCV20) 03/27/2018   COVID-19 Vaccine (3 - Moderna risk series) 12/29/2020    There are no preventive care reminders to display for this patient.   No results found for: TSH Lab Results  Component Value Date   WBC 13.9 (H) 04/04/2021   HGB 12.0 04/04/2021   HCT 37.4 04/04/2021   MCV 94 04/04/2021   PLT 367 04/04/2021   Lab Results  Component Value Date   NA 142 07/20/2021   K 5.1 07/20/2021   CO2 19 (L) 07/20/2021   GLUCOSE 93 07/20/2021   BUN 32 (H) 07/20/2021   CREATININE 2.02 (H) 07/20/2021   BILITOT 0.3 07/20/2021   ALKPHOS 96 07/20/2021   AST 20 07/20/2021   ALT 17 07/20/2021   PROT 6.0 07/20/2021   ALBUMIN 3.9 07/20/2021   CALCIUM 8.7 07/20/2021   ANIONGAP 5 06/26/2016   EGFR 25 (L) 07/20/2021   Lab Results  Component Value Date   CHOL 147 04/04/2021   Lab Results  Component Value Date   HDL 63 04/04/2021   Lab Results  Component Value Date   LDLCALC 64 04/04/2021   Lab Results  Component Value Date   TRIG 113 04/04/2021    Lab Results  Component Value Date   CHOLHDL 2.3 04/04/2021   Lab Results  Component Value Date   HGBA1C 5.6 04/04/2021       Assessment & Plan:   Diagnoses and all orders for this visit: COPD, moderate ([place]) An individualize plan was formulated for care of COPD.  Treatment is evidence based.  She will continue on inhalers, avoid smoking and smoke.  Regular exercise with help with dyspnea. Routine follow ups and medication compliance is needed.   Malignant neoplasm of urinary bladder, unspecified site [facility]) Chronic urinary malignancy being followed by urology  Trochanteric bursitis of left hip -     HYDROcodone-acetaminophen (NORCO) 10-325 MG tablet; Take 1 tablet by mouth every 8 (eight) hours as needed for up to 5 days. -     DG Hip Unilat W OR W/O Pelvis 1V Left Trochanteric bursitis, injected, hydrocodone for pain       I,Furkan Keenum,acting as a scribe for Reinaldo Meeker, MD.,have documented all relevant documentation on the behalf of Reinaldo Meeker, MD,as directed by  Reinaldo Meeker, MD while in the presence of Reinaldo Meeker, MD.   Follow-up: Return next month, regular appointment.  An After Visit Summary was printed and given to the patient.  Reinaldo Meeker, MD [facility] [phone]

## 2021-07-28 DIAGNOSIS — I129 Hypertensive chronic kidney disease with stage 1 through stage 4 chronic kidney disease, or unspecified chronic kidney disease: Secondary | ICD-10-CM | POA: Diagnosis not present

## 2021-07-28 DIAGNOSIS — G2581 Restless legs syndrome: Secondary | ICD-10-CM | POA: Diagnosis not present

## 2021-07-28 DIAGNOSIS — E210 Primary hyperparathyroidism: Secondary | ICD-10-CM | POA: Diagnosis not present

## 2021-07-28 DIAGNOSIS — N184 Chronic kidney disease, stage 4 (severe): Secondary | ICD-10-CM | POA: Diagnosis not present

## 2021-07-28 DIAGNOSIS — N189 Chronic kidney disease, unspecified: Secondary | ICD-10-CM | POA: Diagnosis not present

## 2021-07-28 DIAGNOSIS — D631 Anemia in chronic kidney disease: Secondary | ICD-10-CM | POA: Diagnosis not present

## 2021-07-28 DIAGNOSIS — R600 Localized edema: Secondary | ICD-10-CM | POA: Diagnosis not present

## 2021-08-02 DIAGNOSIS — N184 Chronic kidney disease, stage 4 (severe): Secondary | ICD-10-CM | POA: Diagnosis not present

## 2021-08-03 DIAGNOSIS — Z20822 Contact with and (suspected) exposure to covid-19: Secondary | ICD-10-CM | POA: Diagnosis not present

## 2021-08-08 DIAGNOSIS — E782 Mixed hyperlipidemia: Secondary | ICD-10-CM | POA: Diagnosis not present

## 2021-08-08 DIAGNOSIS — I10 Essential (primary) hypertension: Secondary | ICD-10-CM

## 2021-08-09 DIAGNOSIS — I361 Nonrheumatic tricuspid (valve) insufficiency: Secondary | ICD-10-CM

## 2021-08-09 DIAGNOSIS — I340 Nonrheumatic mitral (valve) insufficiency: Secondary | ICD-10-CM

## 2021-08-09 DIAGNOSIS — I081 Rheumatic disorders of both mitral and tricuspid valves: Secondary | ICD-10-CM | POA: Diagnosis not present

## 2021-08-09 DIAGNOSIS — R600 Localized edema: Secondary | ICD-10-CM | POA: Diagnosis not present

## 2021-08-10 DIAGNOSIS — M810 Age-related osteoporosis without current pathological fracture: Secondary | ICD-10-CM | POA: Diagnosis not present

## 2021-08-14 ENCOUNTER — Other Ambulatory Visit: Payer: Self-pay

## 2021-08-14 DIAGNOSIS — Z20822 Contact with and (suspected) exposure to covid-19: Secondary | ICD-10-CM | POA: Diagnosis not present

## 2021-08-14 MED ORDER — ALENDRONATE SODIUM 70 MG PO TABS: 70.0000 mg | ORAL_TABLET | ORAL | 11 refills | Status: DC

## 2021-08-17 ENCOUNTER — Encounter: Payer: Self-pay | Admitting: Legal Medicine

## 2021-08-17 ENCOUNTER — Ambulatory Visit (INDEPENDENT_AMBULATORY_CARE_PROVIDER_SITE_OTHER): Payer: Medicare Other | Admitting: Legal Medicine

## 2021-08-17 ENCOUNTER — Other Ambulatory Visit: Payer: Self-pay

## 2021-08-17 VITALS — BP 130/72 | HR 82 | Temp 97.8°F | Ht 62.0 in | Wt 166.4 lb

## 2021-08-17 DIAGNOSIS — K2100 Gastro-esophageal reflux disease with esophagitis, without bleeding: Secondary | ICD-10-CM

## 2021-08-17 DIAGNOSIS — E782 Mixed hyperlipidemia: Secondary | ICD-10-CM | POA: Diagnosis not present

## 2021-08-17 DIAGNOSIS — C679 Malignant neoplasm of bladder, unspecified: Secondary | ICD-10-CM

## 2021-08-17 DIAGNOSIS — M810 Age-related osteoporosis without current pathological fracture: Secondary | ICD-10-CM | POA: Diagnosis not present

## 2021-08-17 DIAGNOSIS — I70219 Atherosclerosis of native arteries of extremities with intermittent claudication, unspecified extremity: Secondary | ICD-10-CM

## 2021-08-17 DIAGNOSIS — I5189 Other ill-defined heart diseases: Secondary | ICD-10-CM

## 2021-08-17 DIAGNOSIS — J449 Chronic obstructive pulmonary disease, unspecified: Secondary | ICD-10-CM

## 2021-08-17 DIAGNOSIS — I10 Essential (primary) hypertension: Secondary | ICD-10-CM | POA: Diagnosis not present

## 2021-08-17 DIAGNOSIS — R600 Localized edema: Secondary | ICD-10-CM | POA: Diagnosis not present

## 2021-08-17 DIAGNOSIS — N1831 Chronic kidney disease, stage 3a: Secondary | ICD-10-CM | POA: Diagnosis not present

## 2021-08-17 MED ORDER — METOPROLOL SUCCINATE ER 25 MG PO TB24: 25.0000 mg | ORAL_TABLET | Freq: Every day | ORAL | 3 refills | Status: DC

## 2021-08-17 NOTE — Progress Notes (Signed)
Subjective:  Patient ID: Alexandra [name], female    DOB: 10-12-1940  Age: 81 y.o. MRN: [id number]  Chief Complaint  Patient presents with   Gastroesophageal Reflux   Hypertension   Hyperlipidemia    Gastroesophageal Reflux She reports no abdominal pain, no chest pain, no coughing, no nausea, no sore throat or no wheezing. Pertinent negatives include no fatigue.  Hypertension Pertinent negatives include no chest pain, headaches, neck pain, palpitations or shortness of breath.  Hyperlipidemia Pertinent negatives include no chest pain, myalgias or shortness of breath.    Patient presents for follow up of hypertension.  Patient tolerating amlodipine well with side effects.  Patient was diagnosed with hypertension 2010 so has been treated for hypertension for 12 years.Patient is working on maintaining diet and exercise regimen and follows up as directed. Complication include none.  Patient presents with hyperlipidemia.  Compliance with treatment has been good; patient takes medicines as directed, maintains low cholesterol diet, follows up as directed, and maintains exercise regimen.  Patient is using simvastatin without problems.  Bladder cancer stable, not checked 1 year.  She is having 3+ pedal edema on amlodipine and using diuretics  Current Outpatient Medications on File Prior to Visit  Medication Sig Dispense Refill   alendronate (FOSAMAX) 70 MG tablet Take 1 tablet (70 mg total) by mouth every 7 (seven) days. Take with a full glass of water on an empty stomach. 4 tablet 11   Budeson-Glycopyrrol-Formoterol (BREZTRI AEROSPHERE) 160-9-4.8 MCG/ACT AERO Inhale 2 puffs into the lungs in the morning and at bedtime. 10.7 g 6   diclofenac Sodium (VOLTAREN) 1 % GEL Apply 4 g topically 4 (four) times daily. 4 g 0   esomeprazole (NEXIUM) 40 MG capsule Take 40 mg by mouth daily as needed.      guaiFENesin-codeine (CHERATUSSIN AC) 100-10 MG/5ML syrup Take 5 mLs by mouth 3 (three) times daily as  needed. 120 mL 0   rOPINIRole (REQUIP) 2 MG tablet Take 1 tablet (2 mg total) by mouth in the morning and at bedtime. 180 tablet 6   silver sulfADIAZINE (SILVADENE) 1 % cream Apply 1 application topically daily. 50 g 2   simvastatin (ZOCOR) 40 MG tablet Take 1 tablet (40 mg total) by mouth daily. 90 tablet 2   torsemide (DEMADEX) 20 MG tablet Take 1 tablet (20 mg total) by mouth daily. 30 tablet 3   VENTOLIN HFA 108 (90 Base) MCG/ACT inhaler SMARTSIG:1-2 Puff(s) By Mouth Every 4 Hours PRN     No current facility-administered medications on file prior to visit.   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   Restless legs syndrome    Past Surgical History:  Procedure Laterality Date   ABDOMINAL HYSTERECTOMY     complete   BACK SURGERY  2016   lower L 4 to L 5   bladder tack and uterus lift  10/10/2015   IR RADIOLOGIST EVAL & MGMT  12/03/2018   IR RADIOLOGIST EVAL & MGMT  12/31/2018   left nephrectomy  1999   PARATHYROIDECTOMY Left 07/02/2016   Procedure: LEFT INFERIOR PARATHYROIDECTOMY;  Surgeon: Armandina Gemma, MD;  Location: [facility];  Service: General;  Laterality: Left;   SHOULDER OPEN ROTATOR CUFF REPAIR  12/2019    Family History  Problem Relation Age of Onset   Emphysema Mother    Social History   Socioeconomic History   Marital status: Married    Spouse name: Not on file   Number of children:  Not on file   Years of education: Not on file   Highest education level: Not on file  Occupational History   Not on file  Tobacco Use   Smoking status: Former    Packs/day: 1.00    Years: 50.00    Pack years: 50.00    Types: Cigarettes    Quit date: 06/11/2005    Years since quitting: 16.1   Smokeless tobacco: Never  Vaping Use   Vaping Use: Never used  Substance and Sexual Activity   Alcohol use: No   Drug use: No   Sexual activity: Yes    Partners: Male  Other Topics Concern   Not on file  Social History Narrative   Not on file    Social Determinants of Health   Financial Resource Strain: High Risk   Difficulty of Paying Living Expenses: Hard  Food Insecurity: Not on file  Transportation Needs: No Transportation Needs   Lack of Transportation (Medical): No   Lack of Transportation (Non-Medical): No  Physical Activity: Not on file  Stress: Not on file  Social Connections: Not on file    Review of Systems  Constitutional:  Negative for appetite change, chills, fatigue and fever.  HENT:  Negative for congestion, ear discharge, ear pain, rhinorrhea, sinus pressure, sneezing and sore throat.   Eyes:  Negative for visual disturbance.  Respiratory:  Negative for cough, chest tightness, shortness of breath and wheezing.   Cardiovascular:  Positive for leg swelling. Negative for chest pain and palpitations.  Gastrointestinal:  Negative for abdominal pain, diarrhea, nausea and vomiting.  Endocrine: Negative for polydipsia, polyphagia and polyuria.  Genitourinary:  Negative for difficulty urinating, dysuria, frequency, hematuria, menstrual problem, urgency, vaginal bleeding, vaginal discharge and vaginal pain.  Musculoskeletal:  Negative for back pain, gait problem, joint swelling, myalgias and neck pain.  Neurological:  Negative for dizziness, seizures, syncope, weakness, numbness and headaches.  Psychiatric/Behavioral:  Negative for agitation, confusion, hallucinations, sleep disturbance and suicidal ideas. The patient is not nervous/anxious.     Objective:  BP 130/72    Pulse 82    Temp 97.8 F (36.6 C)    Ht '5\' 2"'$  (1.575 m)    Wt 166 lb 6.4 oz (75.5 kg)    SpO2 96%    BMI 30.43 kg/m   BP/Weight 08/17/2021 07/24/2021 09/32/6712  Systolic BP 458 099 833  Diastolic BP 72 80 70  Wt. (Lbs) 166.4 165 157  BMI 30.43 28.32 26.95    Physical Exam Vitals reviewed.  Constitutional:      Appearance: Normal appearance.  HENT:     Head: Normocephalic.     Right Ear: Tympanic membrane normal.     Left Ear: Tympanic  membrane normal.     Nose: Nose normal.     Mouth/Throat:     Mouth: Mucous membranes are moist.     Pharynx: Oropharynx is clear.  Eyes:     Extraocular Movements: Extraocular movements intact.     Conjunctiva/sclera: Conjunctivae normal.     Pupils: Pupils are equal, round, and reactive to light.  Cardiovascular:     Rate and Rhythm: Normal rate and regular rhythm.     Pulses: Normal pulses.     Heart sounds: Normal heart sounds. No murmur heard.   No gallop.  Pulmonary:     Effort: Pulmonary effort is normal. No respiratory distress.     Breath sounds: Normal breath sounds. No wheezing.  Abdominal:     General: Abdomen is flat. Bowel  sounds are normal. There is no distension.     Tenderness: There is no abdominal tenderness.  Musculoskeletal:        General: Normal range of motion.     Cervical back: Normal range of motion.     Right lower leg: Edema present.     Left lower leg: Edema present.  Skin:    General: Skin is warm.     Capillary Refill: Capillary refill takes less than 2 seconds.  Neurological:     General: No focal deficit present.     Mental Status: She is alert and oriented to person, place, and time. Mental status is at baseline.     Gait: Gait normal.     Deep Tendon Reflexes: Reflexes normal.        Lab Results  Component Value Date   WBC 13.9 (H) 04/04/2021   HGB 12.0 04/04/2021   HCT 37.4 04/04/2021   PLT 367 04/04/2021   GLUCOSE 93 07/20/2021   CHOL 147 04/04/2021   TRIG 113 04/04/2021   HDL 63 04/04/2021   LDLCALC 64 04/04/2021   ALT 17 07/20/2021   AST 20 07/20/2021   NA 142 07/20/2021   K 5.1 07/20/2021   CL 107 (H) 07/20/2021   CREATININE 2.02 (H) 07/20/2021   BUN 32 (H) 07/20/2021   CO2 19 (L) 07/20/2021   HGBA1C 5.6 04/04/2021      Assessment & Plan:   Problem List Items Addressed This Visit       Cardiovascular and Mediastinum   Essential hypertension   Relevant Medications   metoprolol succinate (TOPROL-XL) 25 MG 24  hr tablet   Other Relevant Orders   CBC with Differential   Comprehensive metabolic panel An individual hypertension care plan was established and reinforced today.  The patient's status was assessed using clinical findings on exam and labs or diagnostic tests. The patient's success at meeting treatment goals on disease specific evidence-based guidelines and found to be well controlled. SELF MANAGEMENT: The patient and I together assessed ways to personally work towards obtaining the recommended goals. RECOMMENDATIONS: avoid decongestants found in common cold remedies, decrease consumption of alcohol, perform routine monitoring of BP with home BP cuff, exercise, reduction of dietary salt, take medicines as prescribed, try not to miss doses and quit smoking.  Regular exercise and maintaining a healthy weight is needed.  Stress reduction may help. A CLINICAL SUMMARY including written plan identify barriers to care unique to individual due to social or financial issues.  We attempt to mutually creat solutions for individual and family understanding.      Respiratory   COPD, moderate ([place]) An individualize plan was formulated for care of COPD.  Treatment is evidence based.  She will continue on inhalers, avoid smoking and smoke.  Regular exercise with help with dyspnea. Routine follow ups and medication compliance is needed.      Digestive   Reflux esophagitis   Relevant Orders   CBC with Differential   Comprehensive metabolic panel Plan of care was formulated today.  She is doing well.  A plan of care was formulated using patient exam, tests and other sources to optimize care using evidence based information.  Recommend no smoking, no eating after supper, avoid fatty foods, elevate Head of bed, avoid tight fitting clothing.  Continue on nexium she has tried to quit but reflux got worse.     Musculoskeletal and Integument   Age related osteoporosis Patient has osteoporosis on alendronate  Genitourinary   Bladder cancer [facility]) Bladder cancer is stable    Chronic kidney disease, stage 3a ([place]) Patient was evaluated for stage 3a.  It is important to maintain good Blood Pressure control . Keep on low protein diet and remain with adequate hydration to maintain function.      Other   Hyperlipemia - Primary   Relevant Medications   metoprolol succinate (TOPROL-XL) 25 MG 24 hr tablet   Other Relevant Orders   Lipid panel AN INDIVIDUAL CARE PLAN for hyperlipidemia/ cholesterol was established and reinforced today.  The patient's status was assessed using clinical findings on exam, lab and other diagnostic tests. The patient's disease status was assessed based on evidence-based guidelines and found to be well controlled. MEDICATIONS were reviewed. SELF MANAGEMENT GOALS have been discussed and patient's success at attaining the goal of low cholesterol was assessed. RECOMMENDATION given include regular exercise 3 days a week and low cholesterol/low fat diet. CLINICAL SUMMARY including written plan to identify barriers unique to the patient due to social or economic  reasons was discussed.     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  Meds ordered this encounter  Medications   metoprolol succinate (TOPROL-XL) 25 MG 24 hr tablet    Sig: Take 1 tablet (25 mg total) by mouth daily.    Dispense:  90 tablet    Refill:  3    Orders Placed This Encounter  Procedures   CBC with Differential   Comprehensive metabolic panel   Lipid panel   VAS [place] ABI WITH/WO TBI   A total of 30  minutes were spent face-to-face with the patient during this encounter and over half of that time was spent on counseling and coordination of care.  We discussed her edema  Follow-up: Return in about 3 months (around 11/17/2021) for provider of  choice.  An After Visit Summary was printed and given to the patient.  Reinaldo Meeker, MD [facility] [phone]

## 2021-08-18 LAB — COMPREHENSIVE METABOLIC PANEL
ALT: 16 IU/L (ref 0–32)
AST: 17 IU/L (ref 0–40)
Albumin/Globulin Ratio: 1.6 (ref 1.2–2.2)
Albumin: 4.1 g/dL (ref 3.7–4.7)
Alkaline Phosphatase: 97 IU/L (ref 44–121)
BUN/Creatinine Ratio: 21 (ref 12–28)
BUN: 61 mg/dL — ABNORMAL HIGH (ref 8–27)
Bilirubin Total: 0.3 mg/dL (ref 0.0–1.2)
CO2: 25 mmol/L (ref 20–29)
Calcium: 9.6 mg/dL (ref 8.7–10.3)
Chloride: 97 mmol/L (ref 96–106)
Creatinine, Ser: 2.84 mg/dL — ABNORMAL HIGH (ref 0.57–1.00)
Globulin, Total: 2.5 g/dL (ref 1.5–4.5)
Glucose: 111 mg/dL — ABNORMAL HIGH (ref 70–99)
Potassium: 5.3 mmol/L — ABNORMAL HIGH (ref 3.5–5.2)
Sodium: 138 mmol/L (ref 134–144)
Total Protein: 6.6 g/dL (ref 6.0–8.5)
eGFR: 16 mL/min/{1.73_m2} — ABNORMAL LOW (ref >59)

## 2021-08-18 LAB — CBC WITH DIFFERENTIAL/PLATELET
Basophils Absolute: 0 10*3/uL (ref 0.0–0.2)
Basos: 0 %
EOS (ABSOLUTE): 0.1 10*3/uL (ref 0.0–0.4)
Eos: 1 %
Hematocrit: 34.6 % (ref 34.0–46.6)
Hemoglobin: 11.4 g/dL (ref 11.1–15.9)
Immature Grans (Abs): 0.1 10*3/uL (ref 0.0–0.1)
Immature Granulocytes: 1 %
Lymphocytes Absolute: 2.2 10*3/uL (ref 0.7–3.1)
Lymphs: 18 %
MCH: 30.6 pg (ref 26.6–33.0)
MCHC: 32.9 g/dL (ref 31.5–35.7)
MCV: 93 fL (ref 79–97)
Monocytes Absolute: 0.8 10*3/uL (ref 0.1–0.9)
Monocytes: 6 %
Neutrophils Absolute: 9.2 10*3/uL — ABNORMAL HIGH (ref 1.4–7.0)
Neutrophils: 74 %
Platelets: 378 10*3/uL (ref 150–450)
RBC: 3.72 x10E6/uL — ABNORMAL LOW (ref 3.77–5.28)
RDW: 12.8 % (ref 11.7–15.4)
WBC: 12.4 10*3/uL — ABNORMAL HIGH (ref 3.4–10.8)

## 2021-08-18 LAB — CARDIOVASCULAR RISK ASSESSMENT

## 2021-08-18 LAB — LIPID PANEL
Chol/HDL Ratio: 2.6 ratio (ref 0.0–4.4)
Cholesterol, Total: 172 mg/dL (ref 100–199)
HDL: 65 mg/dL (ref >39)
LDL Chol Calc (NIH): 89 mg/dL (ref 0–99)
Triglycerides: 100 mg/dL (ref 0–149)
VLDL Cholesterol Cal: 18 mg/dL (ref 5–40)

## 2021-08-18 NOTE — Progress Notes (Signed)
Wbc 12.4 chronically high, glucose 111, kidney tests remain stage 4, potassium high 5.3, recheck one week, liver tests normal, Cholesterol normal, consider hematology consult for high wbc ?lp

## 2021-08-22 ENCOUNTER — Telehealth: Payer: Self-pay | Admitting: Legal Medicine

## 2021-08-22 NOTE — Telephone Encounter (Signed)
? ?  Alexandra [name] has been scheduled for the following appointment: ? ?WHAT: Vascular Ultrasound ?WHERE: [place] ?DATE: 08/30/21 ?TIME: 1:30 PM arrival time ? ?Patient has been made aware. ?Spoke to pt's husband ?

## 2021-08-23 DIAGNOSIS — Z20822 Contact with and (suspected) exposure to covid-19: Secondary | ICD-10-CM | POA: Diagnosis not present

## 2021-08-24 ENCOUNTER — Other Ambulatory Visit: Payer: Self-pay

## 2021-08-24 ENCOUNTER — Encounter (HOSPITAL_COMMUNITY): Payer: Medicare Other

## 2021-08-24 ENCOUNTER — Ambulatory Visit: Payer: Medicare Other

## 2021-08-24 DIAGNOSIS — I10 Essential (primary) hypertension: Secondary | ICD-10-CM | POA: Diagnosis not present

## 2021-08-24 LAB — COMPREHENSIVE METABOLIC PANEL
ALT: 19 IU/L (ref 0–32)
AST: 19 IU/L (ref 0–40)
Albumin/Globulin Ratio: 1.7 (ref 1.2–2.2)
Albumin: 3.7 g/dL (ref 3.7–4.7)
Alkaline Phosphatase: 97 IU/L (ref 44–121)
BUN/Creatinine Ratio: 19 (ref 12–28)
BUN: 46 mg/dL — ABNORMAL HIGH (ref 8–27)
Bilirubin Total: 0.2 mg/dL (ref 0.0–1.2)
CO2: 23 mmol/L (ref 20–29)
Calcium: 8.9 mg/dL (ref 8.7–10.3)
Chloride: 103 mmol/L (ref 96–106)
Creatinine, Ser: 2.44 mg/dL — ABNORMAL HIGH (ref 0.57–1.00)
Globulin, Total: 2.2 g/dL (ref 1.5–4.5)
Glucose: 89 mg/dL (ref 70–99)
Potassium: 5.3 mmol/L — ABNORMAL HIGH (ref 3.5–5.2)
Sodium: 140 mmol/L (ref 134–144)
Total Protein: 5.9 g/dL — ABNORMAL LOW (ref 6.0–8.5)
eGFR: 20 mL/min/{1.73_m2} — ABNORMAL LOW (ref >59)

## 2021-08-25 DIAGNOSIS — Z85528 Personal history of other malignant neoplasm of kidney: Secondary | ICD-10-CM | POA: Diagnosis not present

## 2021-08-25 DIAGNOSIS — I129 Hypertensive chronic kidney disease with stage 1 through stage 4 chronic kidney disease, or unspecified chronic kidney disease: Secondary | ICD-10-CM | POA: Diagnosis not present

## 2021-08-25 DIAGNOSIS — D631 Anemia in chronic kidney disease: Secondary | ICD-10-CM | POA: Diagnosis not present

## 2021-08-25 DIAGNOSIS — N184 Chronic kidney disease, stage 4 (severe): Secondary | ICD-10-CM | POA: Diagnosis not present

## 2021-08-25 DIAGNOSIS — E7800 Pure hypercholesterolemia, unspecified: Secondary | ICD-10-CM | POA: Diagnosis not present

## 2021-08-25 DIAGNOSIS — R600 Localized edema: Secondary | ICD-10-CM | POA: Diagnosis not present

## 2021-08-25 NOTE — Progress Notes (Signed)
Kidney still stage 4, potassium 5.3, recheck one week, liver tests normal,  ?lp

## 2021-08-30 DIAGNOSIS — I70219 Atherosclerosis of native arteries of extremities with intermittent claudication, unspecified extremity: Secondary | ICD-10-CM | POA: Diagnosis not present

## 2021-08-30 DIAGNOSIS — I739 Peripheral vascular disease, unspecified: Secondary | ICD-10-CM | POA: Diagnosis not present

## 2021-08-31 ENCOUNTER — Other Ambulatory Visit: Payer: Self-pay

## 2021-08-31 DIAGNOSIS — I70219 Atherosclerosis of native arteries of extremities with intermittent claudication, unspecified extremity: Secondary | ICD-10-CM

## 2021-08-31 DIAGNOSIS — I771 Stricture of artery: Secondary | ICD-10-CM

## 2021-08-31 DIAGNOSIS — Z20828 Contact with and (suspected) exposure to other viral communicable diseases: Secondary | ICD-10-CM | POA: Diagnosis not present

## 2021-09-01 DIAGNOSIS — Z20822 Contact with and (suspected) exposure to covid-19: Secondary | ICD-10-CM | POA: Diagnosis not present

## 2021-09-03 DIAGNOSIS — Z20822 Contact with and (suspected) exposure to covid-19: Secondary | ICD-10-CM | POA: Diagnosis not present

## 2021-09-08 DIAGNOSIS — Z20822 Contact with and (suspected) exposure to covid-19: Secondary | ICD-10-CM | POA: Diagnosis not present

## 2021-09-13 ENCOUNTER — Encounter: Payer: Self-pay | Admitting: Legal Medicine

## 2021-09-13 ENCOUNTER — Ambulatory Visit (INDEPENDENT_AMBULATORY_CARE_PROVIDER_SITE_OTHER): Payer: Medicare Other | Admitting: Legal Medicine

## 2021-09-13 VITALS — BP 138/90 | HR 76 | Temp 98.1°F | Resp 15 | Ht 62.0 in | Wt 166.0 lb

## 2021-09-13 DIAGNOSIS — M7062 Trochanteric bursitis, left hip: Secondary | ICD-10-CM | POA: Diagnosis not present

## 2021-09-13 NOTE — Progress Notes (Signed)
? ?Acute Office Visit ? ?Subjective:  ? ? Patient ID: Alexandra [name], female    DOB: 1941-04-13, 81 y.o.   MRN: [id number] ? ?Chief Complaint  ?Patient presents with  ? Hip Pain  ? ? ?HPI: ?Patient is in today for chronic left hip pain. She mentioned that last month is getting worse. She would like a kenalog shot. Trouble walking.  No falls. Pain worse with walking.  Cannot on let side. ? ?Past Medical History:  ?Diagnosis Date  ? 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 Restless legs syndrome   ? ? ?Past Surgical History:  ?Procedure Laterality Date  ? ABDOMINAL HYSTERECTOMY    ? complete  ? BACK SURGERY  2016  ? lower L 4 to L 5  ? bladder tack and uterus lift  10/10/2015  ? IR RADIOLOGIST EVAL & MGMT  12/03/2018  ? IR RADIOLOGIST EVAL & MGMT  12/31/2018  ? left nephrectomy  1999  ? PARATHYROIDECTOMY Left 07/02/2016  ? Procedure: LEFT INFERIOR PARATHYROIDECTOMY;  Surgeon: Armandina Gemma, MD;  Location: [facility];  Service: General;  Laterality: Left;  ? SHOULDER OPEN ROTATOR CUFF REPAIR  12/2019  ? ? ?Family History  ?Problem Relation Age of Onset  ? Emphysema Mother   ? ? ?Social History  ? ?Socioeconomic History  ? Marital status: Married  ?  Spouse name: Not on file  ? Number of children: Not on file  ? Years of education: Not on file  ? Highest education level: Not on file  ?Occupational History  ? Not on file  ?Tobacco Use  ? Smoking status: Former  ?  Packs/day: 1.00  ?  Years: 50.00  ?  Pack years: 50.00  ?  Types: Cigarettes  ?  Quit date: 06/11/2005  ?  Years since quitting: 16.2  ? Smokeless tobacco: Never  ?Vaping Use  ? Vaping Use: Never used  ?Substance and Sexual Activity  ? Alcohol use: No  ? Drug use: No  ? Sexual activity: Yes  ?  Partners: Male  ?Other Topics Concern  ? Not on file  ?Social History Narrative  ? Not on file  ? ?Social Determinants of Health  ? ?Financial Resource Strain: High Risk  ? Difficulty of Paying Living Expenses: Hard  ?Food Insecurity: Not on file  ?Transportation Needs: No  Transportation Needs  ? Lack of Transportation (Medical): No  ? Lack of Transportation (Non-Medical): No  ?Physical Activity: Not on file  ?Stress: Not on file  ?Social Connections: Not on file  ?Intimate Partner Violence: Not on file  ? ? ?Outpatient Medications Prior to Visit  ?Medication Sig Dispense Refill  ? alendronate (FOSAMAX) 70 MG tablet Take 1 tablet (70 mg total) by mouth every 7 (seven) days. Take with a full glass of water on an empty stomach. 4 tablet 11  ? Budeson-Glycopyrrol-Formoterol (BREZTRI AEROSPHERE) 160-9-4.8 MCG/ACT AERO Inhale 2 puffs into the lungs in the morning and at bedtime. 10.7 g 6  ? diclofenac Sodium (VOLTAREN) 1 % GEL Apply 4 g topically 4 (four) times daily. 4 g 0  ? esomeprazole (NEXIUM) 40 MG capsule Take 40 mg by mouth daily as needed.     ? metoprolol succinate (TOPROL-XL) 25 MG 24 hr tablet Take 1 tablet (25 mg total) by mouth daily. 90 tablet 3  ? rOPINIRole (REQUIP) 2 MG tablet Take 1 tablet (2 mg total) by mouth in the morning and at bedtime. 180 tablet 6  ? silver sulfADIAZINE (SILVADENE) 1 % cream Apply  1 application topically daily. 50 g 2  ? simvastatin (ZOCOR) 40 MG tablet Take 1 tablet (40 mg total) by mouth daily. 90 tablet 2  ? torsemide (DEMADEX) 20 MG tablet Take 1 tablet (20 mg total) by mouth daily. (Patient taking differently: Take 60 mg by mouth daily.) 30 tablet 3  ? VENTOLIN HFA 108 (90 Base) MCG/ACT inhaler SMARTSIG:1-2 Puff(s) By Mouth Every 4 Hours PRN    ? furosemide (LASIX) 20 MG tablet Take 60 mg by mouth daily.    ? guaiFENesin-codeine (CHERATUSSIN AC) 100-10 MG/5ML syrup Take 5 mLs by mouth 3 (three) times daily as needed. 120 mL 0  ? ?No facility-administered medications prior to visit.  ? ? ?Allergies  ?Allergen Reactions  ? Contrast Media [Iodinated Contrast Media] Hives, Itching and Rash  ? Shellfish Allergy Nausea And Vomiting  ? Shellfish-Derived Products Nausea And Vomiting  ? ? ?Review of Systems  ?Constitutional:  Negative for chills,  fatigue and fever.  ?HENT:  Negative for congestion, ear pain and sore throat.   ?Eyes:  Negative for visual disturbance.  ?Respiratory:  Negative for cough and shortness of breath.   ?Cardiovascular:  Negative for chest pain and palpitations.  ?Gastrointestinal:  Negative for abdominal pain, constipation, diarrhea, nausea and vomiting.  ?Endocrine: Negative for polydipsia, polyphagia and polyuria.  ?Genitourinary:  Negative for difficulty urinating and dysuria.  ?Musculoskeletal:  Positive for arthralgias (left hip pain). Negative for back pain and myalgias.  ?Skin:  Negative for rash.  ?Neurological:  Negative for headaches.  ?Psychiatric/Behavioral:  Negative for dysphoric mood. The patient is not nervous/anxious.   ? ?   ?Objective:  ?  ?Physical Exam ?Vitals reviewed.  ?Constitutional:   ?   Appearance: Normal appearance.  ?HENT:  ?   Right Ear: Tympanic membrane normal.  ?   Left Ear: Tympanic membrane normal.  ?   Mouth/Throat:  ?   Mouth: Mucous membranes are moist.  ?Eyes:  ?   Extraocular Movements: Extraocular movements intact.  ?   Pupils: Pupils are equal, round, and reactive to light.  ?Cardiovascular:  ?   Rate and Rhythm: Normal rate and regular rhythm.  ?   Heart sounds: No murmur heard. ?  No gallop.  ?Pulmonary:  ?   Effort: Pulmonary effort is normal. No respiratory distress.  ?   Breath sounds: Normal breath sounds. No wheezing.  ?Abdominal:  ?   General: Abdomen is flat. Bowel sounds are normal.  ?Musculoskeletal:     ?   General: Tenderness present.  ?   Cervical back: Normal range of motion and neck supple.  ?   Right lower leg: No edema.  ?   Left lower leg: No edema.  ?   Comments: Pain left trochanteric bursa with pain of ROM left hip. Some back pain also  ?Skin: ?   General: Skin is warm and dry.  ?   Capillary Refill: Capillary refill takes less than 2 seconds.  ?Neurological:  ?   General: No focal deficit present.  ?   Mental Status: She is alert and oriented to person, place, and  time.  ?   Gait: Gait abnormal.  ? ? ?BP 138/90   Pulse 76   Temp 98.1 ?F (36.7 ?C)   Resp 15   Ht '5\' 2"'  (1.575 m)   Wt 166 lb (75.3 kg)   SpO2 96%   BMI 30.36 kg/m?  ?Wt Readings from Last 3 Encounters:  ?09/13/21 166 lb (75.3 kg)  ?08/17/21  166 lb 6.4 oz (75.5 kg)  ?07/24/21 165 lb (74.8 kg)  ? ? ?Health Maintenance Due  ?Topic Date Due  ? TETANUS/TDAP  Never done  ? Zoster Vaccines- Shingrix (1 of 2) Never done  ? Pneumonia Vaccine 55+ Years old (2 - PPSV23 if available, else PCV20) 03/27/2018  ? COVID-19 Vaccine (3 - Moderna risk series) 12/29/2020  ? ? ?There are no preventive care reminders to display for this patient. ? ? ?No results found for: TSH ?Lab Results  ?Component Value Date  ? WBC 12.4 (H) 08/17/2021  ? HGB 11.4 08/17/2021  ? HCT 34.6 08/17/2021  ? MCV 93 08/17/2021  ? PLT 378 08/17/2021  ? ?Lab Results  ?Component Value Date  ? NA 140 08/24/2021  ? K 5.3 (H) 08/24/2021  ? CO2 23 08/24/2021  ? GLUCOSE 89 08/24/2021  ? BUN 46 (H) 08/24/2021  ? CREATININE 2.44 (H) 08/24/2021  ? BILITOT 0.2 08/24/2021  ? ALKPHOS 97 08/24/2021  ? AST 19 08/24/2021  ? ALT 19 08/24/2021  ? PROT 5.9 (L) 08/24/2021  ? ALBUMIN 3.7 08/24/2021  ? CALCIUM 8.9 08/24/2021  ? ANIONGAP 5 06/26/2016  ? EGFR 20 (L) 08/24/2021  ? ?Lab Results  ?Component Value Date  ? CHOL 172 08/17/2021  ? ?Lab Results  ?Component Value Date  ? HDL 65 08/17/2021  ? ?Lab Results  ?Component Value Date  ? [place] 89 08/17/2021  ? ?Lab Results  ?Component Value Date  ? TRIG 100 08/17/2021  ? ?Lab Results  ?Component Value Date  ? CHOLHDL 2.6 08/17/2021  ? ?Lab Results  ?Component Value Date  ? HGBA1C 5.6 04/04/2021  ? ? ?   ?Assessment & Plan:  ? ?Diagnoses and all orders for this visit: ?Trochanteric bursitis, left hip ?Inject left trochanteric bursa with kenalog  ? ?After consent was obtained, using sterile technique the left trochanteric bursa was prepped and plain Ethyl Chloride was used as local anesthetic. The joint was entered and  28m  kenalog and 3 M Lidocaine was then injected and the needle withdrawn.  The procedure was well tolerated.  The patient is asked to continue to rest the joint for a few more days before resuming regular activities.  It m

## 2021-09-14 ENCOUNTER — Telehealth: Payer: Self-pay

## 2021-09-14 NOTE — Progress Notes (Signed)
Called pt to remind of appt with CPP but no voicemail to leave. ? ?Elray Mcgregor, CMA ?Clinical Pharmacist Assistant  ?[phone]  ?

## 2021-09-18 DIAGNOSIS — H524 Presbyopia: Secondary | ICD-10-CM | POA: Diagnosis not present

## 2021-09-18 DIAGNOSIS — Z20822 Contact with and (suspected) exposure to covid-19: Secondary | ICD-10-CM | POA: Diagnosis not present

## 2021-09-18 DIAGNOSIS — H26491 Other secondary cataract, right eye: Secondary | ICD-10-CM | POA: Diagnosis not present

## 2021-09-19 ENCOUNTER — Ambulatory Visit (INDEPENDENT_AMBULATORY_CARE_PROVIDER_SITE_OTHER): Payer: Medicare Other

## 2021-09-19 ENCOUNTER — Other Ambulatory Visit: Payer: Self-pay | Admitting: Legal Medicine

## 2021-09-19 DIAGNOSIS — E782 Mixed hyperlipidemia: Secondary | ICD-10-CM

## 2021-09-19 DIAGNOSIS — J449 Chronic obstructive pulmonary disease, unspecified: Secondary | ICD-10-CM

## 2021-09-19 DIAGNOSIS — I10 Essential (primary) hypertension: Secondary | ICD-10-CM

## 2021-09-19 MED ORDER — ATORVASTATIN CALCIUM 40 MG PO TABS: 40.0000 mg | ORAL_TABLET | Freq: Every day | ORAL | 3 refills | Status: DC

## 2021-09-19 NOTE — Progress Notes (Signed)
? ?Chronic Care Management ?Pharmacy Note ? ?09/19/2021 ?Name:  Alexandra [name] MRN:  [id number] DOB:  08/07/1940 ? ?Recommendations: ?-Amlodipine 23m/Simvastatin 440mcontraindicated. Recommend changing to Atorvastatin to avoid interactions ?-Alendronate started 01/30/16 and DEXA scores are -3.0 and -4.6. Recommend alternative therapy ? ?Subjective: ?Alexandra CIZEKs an [age].o. year old female who is a primary patient of Alexandra [name] ComfortMD.  The CCM team was consulted for assistance with disease management and care coordination needs.   ? ?Engaged with patient by telephone for follow up visit in response to provider referral for pharmacy case management and/or care coordination services.  ? ?Consent to Services:  ?The patient was given the following information about Chronic Care Management services today, agreed to services, and gave verbal consent: 1. CCM service includes personalized support from designated clinical staff supervised by the primary care provider, including individualized plan of care and coordination with other care providers 2. 24/7 contact phone numbers for assistance for urgent and routine care needs. 3. Service will only be billed when office clinical staff spend 20 minutes or more in a month to coordinate care. 4. Only one practitioner may furnish and bill the service in a calendar month. 5.The patient may stop CCM services at any time (effective at the end of the month) by phone call to the office staff. 6. The patient will be responsible for cost sharing (co-pay) of up to 20% of the service fee (after annual deductible is met). Patient agreed to services and consent obtained. ? ?Patient Care Team: ?PeLillard AnesMD as PCP - General (Family Medicine) ?KeLane HackerRPNorthwest Florida Gastroenterology CenterPharmacist) ? ?Recent office visits:  ?None ?  ?Recent consult visits:  ?None ?  ?Hospital visits:  ?None ? ? ?Objective: ? ?Lab Results  ?Component Value Date  ? CREATININE 2.44 (H) 08/24/2021  ? BUN  46 (H) 08/24/2021  ? EGFR 20 (L) 08/24/2021  ? GFRNONAA 25 (L) 03/28/2020  ? GFRAA 29 (L) 03/28/2020  ? NA 140 08/24/2021  ? K 5.3 (H) 08/24/2021  ? CALCIUM 8.9 08/24/2021  ? CO2 23 08/24/2021  ? GLUCOSE 89 08/24/2021  ? ? ?Lab Results  ?Component Value Date/Time  ? HGBA1C 5.6 04/04/2021 08:09 AM  ?  ?Last diabetic Eye exam: No results found for: HMDIABEYEEXA  ?Last diabetic Foot exam: No results found for: HMDIABFOOTEX  ? ?Lab Results  ?Component Value Date  ? CHOL 172 08/17/2021  ? HDL 65 08/17/2021  ? LDTriana9 08/17/2021  ? TRIG 100 08/17/2021  ? CHOLHDL 2.6 08/17/2021  ? ? ? ?  Latest Ref Rng & Units 08/24/2021  ? 11:35 AM 08/17/2021  ?  8:31 AM 07/20/2021  ?  8:32 AM  ?Hepatic Function  ?Total Protein 6.0 - 8.5 g/dL 5.9   6.6   6.0    ?Albumin 3.7 - 4.7 g/dL 3.7   4.1   3.9    ?AST 0 - 40 IU/L _0 ?ALT 0 - 32 IU/L _1 ?Alk Phosphatase 44 - 121 IU/L 97   97   96    ?Total Bilirubin 0.0 - 1.2 mg/dL 0.2   0.3   0.3    ? ? ?No results found for: TSH, FREET4 ? ? ?  Latest Ref Rng & Units 08/17/2021  ?  8:31 AM 04/04/2021  ?  8:09 AM 09/26/2020  ?  7:58 AM  ?CBC  ?  WBC 3.4 - 10.8 x10E3/uL 12.4   13.9   9.2    ?Hemoglobin 11.1 - 15.9 g/dL 11.4   12.0   11.7    ?Hematocrit 34.0 - 46.6 % 34.6   37.4   35.7    ?Platelets 150 - 450 x10E3/uL 378   367   377    ? ? ?No results found for: VD25OH ? ?Clinical ASCVD: Yes  ?The 10-year ASCVD risk score (Arnett DK, et al., 2019) is: 32.8% ?  Values used to calculate the score: ?    Age: 81 years ?    Sex: Female ?    Is Non-Hispanic African American: No ?    Diabetic: No ?    Tobacco smoker: No ?    Systolic Blood Pressure: 409 mmHg ?    Is BP treated: Yes ?    HDL Cholesterol: 65 mg/dL ?    Total Cholesterol: 172 mg/dL   ? ? ?  08/17/2021  ?  7:48 AM 04/04/2021  ?  7:35 AM 03/07/2021  ? 10:56 AM  ?Depression screen PHQ 2/9  ?Decreased Interest 0 0 0  ?Down, Depressed, Hopeless 0 0 0  ?PHQ - 2 Score 0 0 0  ?  ? ?Other: (CHADS2VASc if Afib, MMRC or CAT for COPD, ACT,  DEXA) ? ?Social History  ? ?Tobacco Use  ?Smoking Status Former  ? Packs/day: 1.00  ? Years: 50.00  ? Pack years: 50.00  ? Types: Cigarettes  ? Quit date: 06/11/2005  ? Years since quitting: 16.2  ?Smokeless Tobacco Never  ? ?BP Readings from Last 3 Encounters:  ?09/13/21 138/90  ?08/17/21 130/72  ?07/24/21 120/80  ? ?Pulse Readings from Last 3 Encounters:  ?09/13/21 76  ?08/17/21 82  ?07/24/21 90  ? ?Wt Readings from Last 3 Encounters:  ?09/13/21 166 lb (75.3 kg)  ?08/17/21 166 lb 6.4 oz (75.5 kg)  ?07/24/21 165 lb (74.8 kg)  ? ?BMI Readings from Last 3 Encounters:  ?09/13/21 30.36 kg/m?  ?08/17/21 30.43 kg/m?  ?07/24/21 28.32 kg/m?  ? ? ?Assessment/Interventions: Review of patient past medical history, allergies, medications, health status, including review of consultants reports, laboratory and other test data, was performed as part of comprehensive evaluation and provision of chronic care management services.  ? ?SDOH:  (Social Determinants of Health) assessments and interventions performed: Yes ?SDOH Interventions   ? ?Flowsheet Row Most Recent Value  ?SDOH Interventions   ?Financial Strain Interventions Other (Comment)  [Breztri PAP]  ?Transportation Interventions Intervention Not Indicated  ? ?  ? ?SDOH Screenings  ? ?Alcohol Screen: Low Risk   ? Last Alcohol Screening Score (AUDIT): 0  ?Depression (PHQ2-9): Low Risk   ? PHQ-2 Score: 0  ?Financial Resource Strain: High Risk  ? Difficulty of Paying Living Expenses: Hard  ?Food Insecurity: Not on file  ?Housing: Not on file  ?Physical Activity: Not on file  ?Social Connections: Not on file  ?Stress: Not on file  ?Tobacco Use: Medium Risk  ? Smoking Tobacco Use: Former  ? Smokeless Tobacco Use: Never  ? Passive Exposure: Not on file  ?Transportation Needs: No Transportation Needs  ? Lack of Transportation (Medical): No  ? Lack of Transportation (Non-Medical): No  ? ? ?CCM Care Plan ? ?Allergies  ?Allergen Reactions  ? Contrast Media [Iodinated Contrast Media]  Hives, Itching and Rash  ? Shellfish Allergy Nausea And Vomiting  ? Shellfish-Derived Products Nausea And Vomiting  ? ? ?Medications Reviewed Today   ? ? Reviewed by Arizona Constable  K, RPH (Pharmacist) on 09/19/21 at 1115  Med List Status: <None>  ? ?Medication Order Taking? Sig Documenting Provider Last Dose Status Informant  ?alendronate (FOSAMAX) 70 MG tablet [id number] Yes Take 1 tablet (70 mg total) by mouth every 7 (seven) days. Take with a full glass of water on an empty stomach. [name], MD Taking Active   ?Budeson-Glycopyrrol-Formoterol (BREZTRI AEROSPHERE) 160-9-4.8 MCG/ACT AERO [id number] Yes Inhale 2 puffs into the lungs in the morning and at bedtime. [name], MD Taking Active   ?diclofenac Sodium (VOLTAREN) 1 % GEL [id number]  Apply 4 g topically 4 (four) times daily. Rip Harbour, NP  Active   ?esomeprazole (NEXIUM) 40 MG capsule [id number]  Take 40 mg by mouth daily as needed.  [provider]  Active   ?metoprolol succinate (TOPROL-XL) 25 MG 24 hr tablet [id number]  Take 1 tablet (25 mg total) by mouth daily. [name], MD  Active   ?rOPINIRole (REQUIP) 2 MG tablet [id number]  Take 1 tablet (2 mg total) by mouth in the morning and at bedtime. [name], MD  Active   ?silver sulfADIAZINE (SILVADENE) 1 % cream [id number]  Apply 1 application topically daily. [name], MD  Active   ?simvastatin (ZOCOR) 40 MG tablet [id number]  Take 1 tablet (40 mg total) by mouth daily. [name], MD  Active   ?torsemide (DEMADEX) 20 MG tablet [id number]  Take 1 tablet (20 mg total) by mouth daily.  ?Patient taking differently: Take 60 mg by mouth daily.  ? [name], MD  Active   ?VENTOLIN HFA 108 (90 Base) MCG/ACT inhaler [id number]  SMARTSIG:1-2 Puff(s) By Mouth Every 4 Hours PRN [provider]  Active   ? ?  ?  ? ?  ? ? ?Patient Active Problem List  ? Diagnosis Date Noted  ? Pedal edema 08/17/2021  ?  Diastolic dysfunction 17/40/8144  ? Trochanteric bursitis, left hip 07/24/2021  ? Bronchopneumonia 05/08/2021  ? Cholecystitis 01/18/2021  ? Trochanteric bursitis of right hip 12/07/2020  ? Female stress incont

## 2021-09-19 NOTE — Patient Instructions (Signed)
Visit Information ? ? Goals Addressed   ?None ?  ? ?Patient Care Plan: Alexandra [name]  ?  ? ?Problem Identified: htn, hld, copd   ?Priority: High  ?Onset Date: 01/31/2021  ?  ? ?Goal: Patient-Specific Goal   ?Start Date: 01/31/2021  ?Expected End Date: 01/31/2022  ?Recent Progress: On track  ?Priority: High  ?Note:   ? ?Current Barriers:  ?Unable to independently afford treatment regimen ? ?Pharmacist Clinical Goal(s):  ?Patient will verbalize ability to afford treatment regimen through collaboration with PharmD and provider.  ? ?Interventions: ?1:1 collaboration with [name], MD regarding development and update of comprehensive plan of care as evidenced by provider attestation and co-signature ?Inter-disciplinary care team collaboration (see longitudinal plan of care) ?Comprehensive medication review performed; medication list updated in electronic medical record ? ?Hypertension (BP goal <140/90) ?BP Readings from Last 3 Encounters:  ?05/08/21 120/70  ?04/17/21 110/70  ?04/11/21 112/70  ?-Controlled ?-Current treatment: ?Amlodipine 10 mg daily am Appropriate, Effective, Safe, Accessible ?-Medications previously tried: none reported at this time ?-Current home readings: reports it was 110/70s at surgeon follow-up ?-Current dietary habits: limited since gall bladder removal. Trying to find out what she can tolerate. Cooking makes her sick right now.  ?-Current exercise habits: moving around the house as she recovers.  ?-Denies hypotensive/hypertensive symptoms ?-Educated on BP goals and benefits of medications for prevention of heart attack, stroke and kidney damage; ?Daily salt intake goal < 2300 mg; ?Exercise goal of 150 minutes per week; ?-Counseled to monitor BP at home as needed, document, and provide log at future appointments ?Feb 2023: Patient driving and didn't have meds today, unable to go to in-depth ? ?Hyperlipidemia: (LDL goal < 100) ?The 10-year ASCVD risk score (Arnett DK, et al.,  2019) is: 25.9% ?  Values used to calculate the score: ?    Age: 52 years ?    Sex: Female ?    Is Non-Hispanic African American: No ?    Diabetic: No ?    Tobacco smoker: No ?    Systolic Blood Pressure: 371 mmHg ?    Is BP treated: Yes ?    HDL Cholesterol: 63 mg/dL ?    Total Cholesterol: 147 mg/dL ?Lab Results  ?Component Value Date  ? CHOL 147 04/04/2021  ? CHOL 144 09/26/2020  ? CHOL 149 03/28/2020  ? ?Lab Results  ?Component Value Date  ? HDL 63 04/04/2021  ? HDL 54 09/26/2020  ? HDL 50 03/28/2020  ? ?Lab Results  ?Component Value Date  ? [place] 64 04/04/2021  ? [place] 73 09/26/2020  ? [place] 77 03/28/2020  ? ?Lab Results  ?Component Value Date  ? TRIG 113 04/04/2021  ? TRIG 90 09/26/2020  ? TRIG 122 03/28/2020  ? ?Lab Results  ?Component Value Date  ? CHOLHDL 2.3 04/04/2021  ? CHOLHDL 2.7 09/26/2020  ? CHOLHDL 3.0 03/28/2020  ?No results found for: LDLDIRECT ?-Controlled ?-Current treatment: ?simvastatin 40 mg daily Appropriate, Effective, Query Safe, Accessible ?-Medications previously tried: none reported  ?-Current dietary habits: limited since gall bladder removal. Trying to find out what she can tolerate. Cooking makes her sick right now.  ?-Current exercise habits: moving around the house as she recovers.  ?-Denies hypotensive/hypertensive symptoms ?-Educated on Cholesterol goals;  ?Benefits of statin for ASCVD risk reduction; ?Exercise goal of 150 minutes per week; ?-Counseled on diet and exercise extensively ?Feb 2023: Patient driving and didn't have meds today, unable to go to in-depth ?April 2023: Simvastatin dose  too high to combo with Amlodipine, will let PCP know ? ?Osteoporosis / Osteopenia (Goal Prevent fracture) ?-Uncontrolled ?-Last DEXA Scan: 08/10/21  ? T-Score femur Left: ?  -08/10/21: -3.0 ?  -01/05/19: -2.2 ?  -01/03/15: -2.4 ?  -11/21/12: -2.6 ? T-Score femur Total: ?  -08/10/21: -2.6 ?  -01/05/19: -2.1 ?  -01/03/15: -2.1 ?  -11/21/12: -2.2 ? T-Score forearm radius:  ?  -08/10/21:  -4.6 ?  -01/05/19: -4.3 ?-Patient is a candidate for pharmacologic treatment due to T-Score<-2.5 ?-Current treatment  ?Alendronate '70mg'$ /week (Earliest record is 01/30/16) Query Appropriate, Query effective, Query Safe, Accessible ?-Medications previously tried: N/A  ?-Recommend [phone] units of vitamin D daily. ?April 2023: Recommend alternative therapy ? ? ?COPD (Goal: control symptoms and prevent exacerbations) ?-Controlled ?-Current treatment  ?Albuterol Appropriate, Effective, Safe, Accessible ?Breztri (PAP) Appropriate, Effective, Safe, Accessible ?-Medications previously tried: N/A  ?-Gold Grade: N/A ?-Current COPD Classification:  B (high sx, <2 exacerbations/yr) ?-MMRC/CAT score:  ? ?  09/19/2021  ? 11:01 AM  ?CAT Score  ?Total CAT Score 18  ?-Pulmonary function testing:  ?PF Readings from Last 3 Encounters:  ?No data found for PF  ?-Exacerbations requiring treatment in last 6 months: None ?-Patient denies consistent use of maintenance inhaler ?-Counseled on Pro[place] inhaler technique; ?-Recommended to continue current medication ? ?Patient Goals/Self-Care Activities ?Patient will:  ?- take medications as prescribed ?focus on medication adherence by using pill box ?target a minimum of 150 minutes of moderate intensity exercise weekly ?engage in dietary modifications by working to get nutrients in diet needed since gall bladder surgery.  ? ?Follow Up Plan: Telephone follow up appointment with care management team member scheduled for: November 2023 ? ?Arizona Constable, Pharm.D. - [phone] ? ? ?  ? ? ?Ms. Stepka was given information about Chronic Care Management services today including:  ?CCM service includes personalized support from designated clinical staff supervised by her physician, including individualized plan of care and coordination with other care providers ?24/7 contact phone numbers for assistance for urgent and routine care needs. ?Standard insurance, coinsurance, copays and deductibles apply for  chronic care management only during months in which we provide at least 20 minutes of these services. Most insurances cover these services at 100%, however patients may be responsible for any copay, coinsurance and/or deductible if applicable. This service may help you avoid the need for more expensive face-to-face services. ?Only one practitioner may furnish and bill the service in a calendar month. ?The patient may stop CCM services at any time (effective at the end of the month) by phone call to the office staff. ? ?Patient agreed to services and verbal consent obtained.  ? ?The patient verbalized understanding of instructions, educational materials, and care plan provided today and declined offer to receive copy of patient instructions, educational materials, and care plan.  ?The pharmacy team will reach out to the patient again over the next 60 days.  ? ?Lane Hacker, [place] ? ?

## 2021-09-21 ENCOUNTER — Encounter: Payer: Self-pay | Admitting: Vascular Surgery

## 2021-09-21 ENCOUNTER — Ambulatory Visit (INDEPENDENT_AMBULATORY_CARE_PROVIDER_SITE_OTHER): Payer: Medicare Other | Admitting: Vascular Surgery

## 2021-09-21 VITALS — BP 179/79 | HR 67 | Temp 98.6°F | Resp 20 | Ht 64.0 in | Wt 172.8 lb

## 2021-09-21 DIAGNOSIS — I70219 Atherosclerosis of native arteries of extremities with intermittent claudication, unspecified extremity: Secondary | ICD-10-CM | POA: Diagnosis not present

## 2021-09-21 DIAGNOSIS — I890 Lymphedema, not elsewhere classified: Secondary | ICD-10-CM

## 2021-09-21 NOTE — Progress Notes (Signed)
? ?ASSESSMENT & PLAN  ? ?LYMPHEDEMA: Based on her history and exam I think this patient has significant lymphedema bilaterally.  We have discussed the importance of leg elevation and the proper positioning for this.  Until her swelling has improved I do not think it would be useful to get her fitted into compression stockings.  I have given her 4 inch and 6 inch Ace's for compression for the time period.  I have encouraged her to avoid prolonged sitting and standing.  We discussed the importance of exercise specifically walking and water aerobics.  We also discussed the importance of maintaining a healthy weight.  I plan on seeing her back in 1 month.  Given that her swelling is more significant on the left I will obtain a formal venous reflux study on the left to rule out any significant venous disease.  If the swelling has improved at that point I think we could get her fitted for a knee-high compression stocking.  The other consideration would be a BioTAB lymphedema pump. ? ?REASON FOR CONSULT:   ? ?Claudication.  The consult is requested by Dr. Reinaldo Meeker. ? ?HPI:  ? ?Alexandra [name] is a 81 y.o. female who was referred to our office with claudication.  However upon obtaining a history it sounds like her main complaint is leg swelling.  The patient states that she has had swelling in both legs for at least 3 years.  However over the last 3 to 4 months the swelling has gradually increased.  She denies any previous history of DVT.  She has had a previous hysterectomy previous nephrectomy.  She denies any previous radiation therapy.  I do not get any history of claudication or rest pain. ? ?Her risk factors for peripheral arterial disease include hypertension, hypercholesterolemia, and a remote history of tobacco use.  She quit in 2007.  She denies any history of diabetes.  She denies any family history of premature cardiovascular disease. ? ?Past Medical History:  ?Diagnosis Date  ? Hyperparathyroidism,  primary ([place]) 06/25/2016  ? Restless legs syndrome   ? ? ?Family History  ?Problem Relation Age of Onset  ? Emphysema Mother   ? ? ?SOCIAL HISTORY: ?Social History  ? ?Tobacco Use  ? Smoking status: Former  ?  Packs/day: 1.00  ?  Years: 50.00  ?  Pack years: 50.00  ?  Types: Cigarettes  ?  Quit date: 06/11/2005  ?  Years since quitting: 16.2  ? Smokeless tobacco: Never  ?Substance Use Topics  ? Alcohol use: No  ? ? ?Allergies  ?Allergen Reactions  ? Contrast Media [Iodinated Contrast Media] Hives, Itching and Rash  ? Shellfish Allergy Nausea And Vomiting  ? Shellfish-Derived Products Nausea And Vomiting  ? ? ?Current Outpatient Medications  ?Medication Sig Dispense Refill  ? alendronate (FOSAMAX) 70 MG tablet Take 1 tablet (70 mg total) by mouth every 7 (seven) days. Take with a full glass of water on an empty stomach. 4 tablet 11  ? atorvastatin (LIPITOR) 40 MG tablet Take 1 tablet (40 mg total) by mouth daily. Per ccm 90 tablet 3  ? Budeson-Glycopyrrol-Formoterol (BREZTRI AEROSPHERE) 160-9-4.8 MCG/ACT AERO Inhale 2 puffs into the lungs in the morning and at bedtime. 10.7 g 6  ? esomeprazole (NEXIUM) 40 MG capsule Take 40 mg by mouth daily as needed.     ? metoprolol succinate (TOPROL-XL) 25 MG 24 hr tablet Take 1 tablet (25 mg total) by mouth daily. 90 tablet 3  ? rOPINIRole (  REQUIP) 2 MG tablet Take 1 tablet (2 mg total) by mouth in the morning and at bedtime. 180 tablet 6  ? torsemide (DEMADEX) 20 MG tablet Take 1 tablet (20 mg total) by mouth daily. (Patient taking differently: Take 60 mg by mouth daily.) 30 tablet 3  ? VENTOLIN HFA 108 (90 Base) MCG/ACT inhaler SMARTSIG:1-2 Puff(s) By Mouth Every 4 Hours PRN    ? ?No current facility-administered medications for this visit.  ? ? ?REVIEW OF SYSTEMS:  ?'[X]'$  denotes positive finding, '[ ]'$  denotes negative finding ?Cardiac  Comments:  ?Chest pain or chest pressure:    ?Shortness of breath upon exertion: x   ?Short of breath when lying flat:    ?Irregular heart rhythm:     ?    ?Vascular    ?Pain in calf, thigh, or hip brought on by ambulation: x   ?Pain in feet at night that wakes you up from your sleep:  x   ?Blood clot in your veins:    ?Leg swelling:     ?    ?Pulmonary    ?Oxygen at home:    ?Productive cough:     ?Wheezing:     ?    ?Neurologic    ?Sudden weakness in arms or legs:     ?Sudden numbness in arms or legs:     ?Sudden onset of difficulty speaking or slurred speech:    ?Temporary loss of vision in one eye:     ?Problems with dizziness:     ?    ?Gastrointestinal    ?Blood in stool:     ?Vomited blood:     ?    ?Genitourinary    ?Burning when urinating:     ?Blood in urine:    ?    ?Psychiatric    ?Major depression:     ?    ?Hematologic    ?Bleeding problems:    ?Problems with blood clotting too easily:    ?    ?Skin    ?Rashes or ulcers:    ?    ?Constitutional    ?Fever or chills:    ?- ? ?PHYSICAL EXAM:  ? ?Vitals:  ? 09/21/21 0910  ?BP: (!) 179/79  ?Pulse: 67  ?Resp: 20  ?Temp: 98.6 ?F (37 ?C)  ?SpO2: 97%  ?Weight: 172 lb 12.8 oz (78.4 kg)  ?Height: '5\' 4"'$  (1.626 m)  ? ?Body mass index is 29.66 kg/m?. ?GENERAL: The patient is a well-nourished female, in no acute distress. The vital signs are documented above. ?CARDIAC: There is a regular rate and rhythm.  ?VASCULAR: I do not detect carotid bruits. ?She has palpable femoral pulses. ?She has a biphasic dorsalis pedis and posterior tibial signal bilaterally. ?She has significant bilateral nonpitting edema consistent with lymphedema. ?She does have some hyperkeratosis bilaterally. ? ? ? ? ?PULMONARY: There is good air exchange bilaterally without wheezing or rales. ?ABDOMEN: Soft and non-tender with normal pitched bowel sounds.  ?MUSCULOSKELETAL: There are no major deformities. ?NEUROLOGIC: No focal weakness or paresthesias are detected. ?SKIN: There are no ulcers or rashes noted. ?PSYCHIATRIC: The patient has a normal affect. ? ?DATA:   ? ?ARTERIAL DOPPLER STUDY: I reviewed the Doppler study that was done on  08/30/2021. ? ?On the right side the ABI was 94% at rest with no significant change after exercise. ? ?On the left side the ABI was 100% at rest and 93% after exercise. ? ?Alexandra [name] ?[facility] ?

## 2021-09-25 DIAGNOSIS — Z20822 Contact with and (suspected) exposure to covid-19: Secondary | ICD-10-CM | POA: Diagnosis not present

## 2021-09-28 ENCOUNTER — Other Ambulatory Visit: Payer: Self-pay | Admitting: *Deleted

## 2021-09-28 DIAGNOSIS — I890 Lymphedema, not elsewhere classified: Secondary | ICD-10-CM

## 2021-10-01 DIAGNOSIS — Z20828 Contact with and (suspected) exposure to other viral communicable diseases: Secondary | ICD-10-CM | POA: Diagnosis not present

## 2021-10-04 DIAGNOSIS — Z20822 Contact with and (suspected) exposure to covid-19: Secondary | ICD-10-CM | POA: Diagnosis not present

## 2021-10-05 DIAGNOSIS — Z20822 Contact with and (suspected) exposure to covid-19: Secondary | ICD-10-CM | POA: Diagnosis not present

## 2021-10-08 DIAGNOSIS — E782 Mixed hyperlipidemia: Secondary | ICD-10-CM

## 2021-10-08 DIAGNOSIS — I10 Essential (primary) hypertension: Secondary | ICD-10-CM

## 2021-10-08 DIAGNOSIS — J449 Chronic obstructive pulmonary disease, unspecified: Secondary | ICD-10-CM | POA: Diagnosis not present

## 2021-10-09 DIAGNOSIS — Z20828 Contact with and (suspected) exposure to other viral communicable diseases: Secondary | ICD-10-CM | POA: Diagnosis not present

## 2021-10-12 DIAGNOSIS — Z20822 Contact with and (suspected) exposure to covid-19: Secondary | ICD-10-CM | POA: Diagnosis not present

## 2021-10-16 DIAGNOSIS — Z20822 Contact with and (suspected) exposure to covid-19: Secondary | ICD-10-CM | POA: Diagnosis not present

## 2021-10-19 ENCOUNTER — Telehealth: Payer: Self-pay

## 2021-10-19 NOTE — Telephone Encounter (Signed)
Patients husband called stating she needed to be seen. Reported she "bumped her leg on his wheelchair a few days ago and now the area was festered up." I spoke with patient whom stated the peddle from her spouses wheelchair scrapper her shin on Tuesday, she has been cleaning the area with peroxide and applying neosporin daily, however she has some redness, oozing watery fluid and it has some "fever" in it, at times the area stings when walking. Denied any fever/chills or swelling. Scheduled patient for appt tomorrow morning, advised she only clean the area with soap and water, d/c neosporin; apply vaseline instead and then cover. Patient expressed understanding.  ?

## 2021-10-20 ENCOUNTER — Encounter: Payer: Self-pay | Admitting: Family Medicine

## 2021-10-20 ENCOUNTER — Ambulatory Visit (INDEPENDENT_AMBULATORY_CARE_PROVIDER_SITE_OTHER): Payer: Medicare Other | Admitting: Family Medicine

## 2021-10-20 VITALS — BP 110/70 | HR 100 | Temp 97.1°F | Resp 18 | Ht 64.0 in | Wt 165.0 lb

## 2021-10-20 DIAGNOSIS — S81812A Laceration without foreign body, left lower leg, initial encounter: Secondary | ICD-10-CM | POA: Diagnosis not present

## 2021-10-20 MED ORDER — CEPHALEXIN 500 MG PO CAPS: 500.0000 mg | ORAL_CAPSULE | Freq: Two times a day (BID) | ORAL | 0 refills | Status: DC

## 2021-10-20 NOTE — Assessment & Plan Note (Signed)
Keflex.  ?Elevation.  ?Dressed and wrapped.  ?Consider unna boot next week if not better.  ?Schedule to see Larene Beach.  ?

## 2021-10-20 NOTE — Patient Instructions (Signed)
keflex

## 2021-10-20 NOTE — Progress Notes (Signed)
? ?Subjective:  ?Patient ID: Alexandra [name], female    DOB: 1941-04-02  Age: 81 y.o. MRN: [id number] ? ?Chief Complaint  ?Patient presents with  ? abrasion left lower leg  ? ? ?HPI ? Alexandra [name] comes in with an abrasion left lower leg.  She reports that she was lifting her husband's wheelchair out of the car and struck her lower leg.  UTD on TDAP 2023. History of lymphedema. Seeing. Alexandra [name].  ? ?Current Outpatient Medications on File Prior to Visit  ?Medication Sig Dispense Refill  ? alendronate (FOSAMAX) 70 MG tablet Take 1 tablet (70 mg total) by mouth every 7 (seven) days. Take with a full glass of water on an empty stomach. 4 tablet 11  ? atorvastatin (LIPITOR) 40 MG tablet Take 1 tablet (40 mg total) by mouth daily. Per ccm 90 tablet 3  ? Budeson-Glycopyrrol-Formoterol (BREZTRI AEROSPHERE) 160-9-4.8 MCG/ACT AERO Inhale 2 puffs into the lungs in the morning and at bedtime. 10.7 g 6  ? esomeprazole (NEXIUM) 40 MG capsule Take 40 mg by mouth daily as needed.     ? metoprolol succinate (TOPROL-XL) 25 MG 24 hr tablet Take 1 tablet (25 mg total) by mouth daily. 90 tablet 3  ? rOPINIRole (REQUIP) 2 MG tablet Take 1 tablet (2 mg total) by mouth in the morning and at bedtime. 180 tablet 6  ? torsemide (DEMADEX) 20 MG tablet Take 1 tablet (20 mg total) by mouth daily. (Patient taking differently: Take 60 mg by mouth daily.) 30 tablet 3  ? VENTOLIN HFA 108 (90 Base) MCG/ACT inhaler SMARTSIG:1-2 Puff(s) By Mouth Every 4 Hours PRN    ? ?No current facility-administered medications on file prior to visit.  ? ?Past Medical History:  ?Diagnosis Date  ? COPD (chronic obstructive pulmonary disease) ([place])   ? GERD (gastroesophageal reflux disease)   ? Hyperlipidemia   ? Hyperparathyroidism, primary ([place]) 06/25/2016  ? Hypertension   ? Osteoporosis   ? Restless legs syndrome   ? RLS (restless legs syndrome)   ? ?Past Surgical History:  ?Procedure Laterality Date  ? ABDOMINAL HYSTERECTOMY    ? complete: menorrhagia. 81 yo  ?  BACK SURGERY  2016  ? lower L 4 to L 5  ? bladder tack and uterus lift  10/10/2015  ? IR RADIOLOGIST EVAL & MGMT  12/03/2018  ? IR RADIOLOGIST EVAL & MGMT  12/31/2018  ? PARATHYROIDECTOMY Left 07/02/2016  ? Procedure: LEFT INFERIOR PARATHYROIDECTOMY;  Surgeon: Armandina Gemma, MD;  Location: [facility];  Service: General;  Laterality: Left;  ? SHOULDER OPEN ROTATOR CUFF REPAIR  12/2019  ? TOTAL NEPHRECTOMY Left 1999  ? Renal cancer.  ?  ?Family History  ?Problem Relation Age of Onset  ? Emphysema Mother   ? Hyperlipidemia Sister   ? Hypertension Sister   ? COPD Sister   ? Hyperlipidemia Sister   ? Hypertension Sister   ? Cancer Sister   ?     lymphoma  ? Obesity Brother   ? Hyperlipidemia Brother   ? Hypertension Brother   ? ?Social History  ? ?Socioeconomic History  ? Marital status: Married  ?  Spouse name: Not on file  ? Number of children: Not on file  ? Years of education: Not on file  ? Highest education level: Not on file  ?Occupational History  ? Not on file  ?Tobacco Use  ? Smoking status: Former  ?  Packs/day: 1.00  ?  Years: 50.00  ?  Pack years: 50.00  ?  Types: Cigarettes  ?  Quit date: 06/11/2005  ?  Years since quitting: 16.3  ? Smokeless tobacco: Never  ?Vaping Use  ? Vaping Use: Never used  ?Substance and Sexual Activity  ? Alcohol use: No  ? Drug use: No  ? Sexual activity: Yes  ?  Partners: Male  ?Other Topics Concern  ? Not on file  ?Social History Narrative  ? Not on file  ? ?Social Determinants of Health  ? ?Financial Resource Strain: High Risk  ? Difficulty of Paying Living Expenses: Hard  ?Food Insecurity: Not on file  ?Transportation Needs: No Transportation Needs  ? Lack of Transportation (Medical): No  ? Lack of Transportation (Non-Medical): No  ?Physical Activity: Not on file  ?Stress: Not on file  ?Social Connections: Not on file  ? ? ?Review of Systems  ?Constitutional:  Negative for chills and fever.  ?Cardiovascular:  Positive for leg swelling. Negative for chest pain.  ?Skin:  Positive for  wound (left lower leg.).  ? ? ?Objective:  ?BP 110/70   Pulse 100   Temp (!) 97.1 ?F (36.2 ?C)   Resp 18   Ht '5\' 4"'$  (1.626 m)   Wt 165 lb (74.8 kg)   BMI 28.32 kg/m?  ? ? ?  10/20/2021  ?  7:37 AM 09/21/2021  ?  9:10 AM 09/13/2021  ? 10:48 AM  ?BP/Weight  ?Systolic BP 030 092 330  ?Diastolic BP 70 79 90  ?Wt. (Lbs) 165 172.8 166  ?BMI 28.32 kg/m2 29.66 kg/m2 30.36 kg/m2  ? ? ?Physical Exam ?Vitals reviewed.  ?Constitutional:   ?   Appearance: Normal appearance. She is obese.  ?Cardiovascular:  ?   Rate and Rhythm: Normal rate and regular rhythm.  ?   Heart sounds: Normal heart sounds.  ?Pulmonary:  ?   Effort: Pulmonary effort is normal. No respiratory distress.  ?   Breath sounds: Normal breath sounds.  ?Musculoskeletal:  ?   Right lower leg: Edema present.  ?   Left lower leg: Edema present.  ?Skin: ?   Findings: Lesion (lateral laceration. blister. tender. erythema.) present.  ?Neurological:  ?   Mental Status: She is alert and oriented to person, place, and time.  ?Psychiatric:     ?   Mood and Affect: Mood normal.     ?   Behavior: Behavior normal.  ? ? ?Diabetic Foot Exam - Simple   ?No data filed ?  ?  ? ?Lab Results  ?Component Value Date  ? WBC 12.4 (H) 08/17/2021  ? HGB 11.4 08/17/2021  ? HCT 34.6 08/17/2021  ? PLT 378 08/17/2021  ? GLUCOSE 89 08/24/2021  ? CHOL 172 08/17/2021  ? TRIG 100 08/17/2021  ? HDL 65 08/17/2021  ? [place] 89 08/17/2021  ? ALT 19 08/24/2021  ? AST 19 08/24/2021  ? NA 140 08/24/2021  ? K 5.3 (H) 08/24/2021  ? CL 103 08/24/2021  ? CREATININE 2.44 (H) 08/24/2021  ? BUN 46 (H) 08/24/2021  ? CO2 23 08/24/2021  ? HGBA1C 5.6 04/04/2021  ? ? ? ? ?Assessment & Plan:  ? ?Problem List Items Addressed This Visit   ? ?  ? Other  ? Laceration of left lower extremity - Primary  ?  Keflex.  ?Elevation.  ?Dressed and wrapped.  ?Consider unna boot next week if not better.  ?Schedule to see Alexandra [name].  ? ?  ?  ? Relevant Medications  ? cephALEXin (KEFLEX) 500 MG capsule  ?. ? ?Meds ordered this  encounter  ?Medications  ? cephALEXin (KEFLEX) 500 MG capsule  ?  Sig: Take 1 capsule (500 mg total) by mouth 2 (two) times daily.  ?  Dispense:  14 capsule  ?  Refill:  0  ? ?Follow-up: Return in about 5 days (around 10/25/2021) for with shannon. wound recheck. ? ?An After Visit Summary was printed and given to the patient. ? ?Rochel Brome, MD ?[place] ?[phone]

## 2021-10-24 ENCOUNTER — Other Ambulatory Visit: Payer: Self-pay | Admitting: Nurse Practitioner

## 2021-10-24 ENCOUNTER — Encounter: Payer: Self-pay | Admitting: Nurse Practitioner

## 2021-10-24 ENCOUNTER — Ambulatory Visit (INDEPENDENT_AMBULATORY_CARE_PROVIDER_SITE_OTHER): Payer: Medicare Other | Admitting: Nurse Practitioner

## 2021-10-24 VITALS — BP 118/74 | HR 88 | Temp 96.0°F | Ht 63.0 in | Wt 164.0 lb

## 2021-10-24 DIAGNOSIS — I10 Essential (primary) hypertension: Secondary | ICD-10-CM | POA: Diagnosis not present

## 2021-10-24 DIAGNOSIS — S81812D Laceration without foreign body, left lower leg, subsequent encounter: Secondary | ICD-10-CM | POA: Diagnosis not present

## 2021-10-24 DIAGNOSIS — E782 Mixed hyperlipidemia: Secondary | ICD-10-CM | POA: Diagnosis not present

## 2021-10-24 MED ORDER — ATORVASTATIN CALCIUM 40 MG PO TABS: 40.0000 mg | ORAL_TABLET | Freq: Every day | ORAL | 3 refills | Status: DC

## 2021-10-24 NOTE — Progress Notes (Addendum)
Subjective:  Patient ID: Alexandra [name], female    DOB: 02/15/1941  Age: 81 y.o. MRN: [id number]  Chief Complaint  Patient presents with   Lymphedema   Leg laceration       HPI  Alexandra [name] is a 81 year old Caucasian female that presents for follow-up of left lower anterior leg laceration. Injury occurred by bumping against spouse's wheelchair. Tdap current 2023. She denies pain, warmth, drainage, or odor to wound. Denies difficulty weight-bearing or ambulating.  Treatment has included a course of Keflex, covering wound and elevating legs. She has bilateral lower extremity lymphedema, followed by Dr Deitra Mayo. States she is to have compression stockings in the near future.    Current Outpatient Medications on File Prior to Visit  Medication Sig Dispense Refill   alendronate (FOSAMAX) 70 MG tablet Take 1 tablet (70 mg total) by mouth every 7 (seven) days. Take with a full glass of water on an empty stomach. 4 tablet 11   atorvastatin (LIPITOR) 40 MG tablet Take 1 tablet (40 mg total) by mouth daily. Per ccm 90 tablet 3   Budeson-Glycopyrrol-Formoterol (BREZTRI AEROSPHERE) 160-9-4.8 MCG/ACT AERO Inhale 2 puffs into the lungs in the morning and at bedtime. 10.7 g 6   cephALEXin (KEFLEX) 500 MG capsule Take 1 capsule (500 mg total) by mouth 2 (two) times daily. 14 capsule 0   esomeprazole (NEXIUM) 40 MG capsule Take 40 mg by mouth daily as needed.      metoprolol succinate (TOPROL-XL) 25 MG 24 hr tablet Take 1 tablet (25 mg total) by mouth daily. 90 tablet 3   rOPINIRole (REQUIP) 2 MG tablet Take 1 tablet (2 mg total) by mouth in the morning and at bedtime. 180 tablet 6   torsemide (DEMADEX) 20 MG tablet Take 1 tablet (20 mg total) by mouth daily. (Patient taking differently: Take 60 mg by mouth daily.) 30 tablet 3   VENTOLIN HFA 108 (90 Base) MCG/ACT inhaler SMARTSIG:1-2 Puff(s) By Mouth Every 4 Hours PRN     No current facility-administered medications on file prior to visit.    Past Medical History:  Diagnosis Date   COPD (chronic obstructive pulmonary disease) ([facility])    GERD (gastroesophageal reflux disease)    Hyperlipidemia    Hyperparathyroidism, primary ([place]) 06/25/2016   Hypertension    Osteoporosis    Restless legs syndrome    RLS (restless legs syndrome)    Past Surgical History:  Procedure Laterality Date   ABDOMINAL HYSTERECTOMY     complete: menorrhagia. 81 yo   BACK SURGERY  2016   lower L 4 to L 5   bladder tack and uterus lift  10/10/2015   IR RADIOLOGIST EVAL & MGMT  12/03/2018   IR RADIOLOGIST EVAL & MGMT  12/31/2018   PARATHYROIDECTOMY Left 07/02/2016   Procedure: LEFT INFERIOR PARATHYROIDECTOMY;  Surgeon: Armandina Gemma, MD;  Location: [facility];  Service: General;  Laterality: Left;   SHOULDER OPEN ROTATOR CUFF REPAIR  12/2019   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    Family History  Problem Relation Age of Onset   Emphysema Mother    Hyperlipidemia Sister    Hypertension Sister    COPD Sister    Hyperlipidemia Sister    Hypertension Sister    Cancer Sister        lymphoma   Obesity Brother    Hyperlipidemia Brother    Hypertension Brother    Social History   Socioeconomic History   Marital status: Married  Spouse name: Not on file   Number of children: Not on file   Years of education: Not on file   Highest education level: Not on file  Occupational History   Not on file  Tobacco Use   Smoking status: Former    Packs/day: 1.00    Years: 50.00    Pack years: 50.00    Types: Cigarettes    Quit date: 06/11/2005    Years since quitting: 16.3   Smokeless tobacco: Never  Vaping Use   Vaping Use: Never used  Substance and Sexual Activity   Alcohol use: No   Drug use: No   Sexual activity: Yes    Partners: Male  Other Topics Concern   Not on file  Social History Narrative   Not on file   Social Determinants of Health   Financial Resource Strain: Low Risk    Difficulty of Paying Living Expenses: Not  hard at all  Food Insecurity: No Food Insecurity   Worried About Charity fundraiser in the Last Year: Never true   Alexandra [name] in the Last Year: Never true  Transportation Needs: No Transportation Needs   Lack of Transportation (Medical): No   Lack of Transportation (Non-Medical): No  Physical Activity: Inactive   Days of Exercise per Week: 0 days   Minutes of Exercise per Session: 0 min  Stress: No Stress Concern Present   Feeling of Stress : Not at all  Social Connections: Moderately Integrated   Frequency of Communication with Friends and Family: More than three times a week   Frequency of Social Gatherings with Friends and Family: More than three times a week   Attends Religious Services: More than 4 times per year   Active Member of [employer] or Organizations: No   Attends Archivist Meetings: Never   Marital Status: Married    Review of Systems  Cardiovascular:  Positive for leg swelling (chronic).    Objective:  BP 118/74   Pulse 88   Temp (!) 96 F (35.6 C)   Ht '5\' 3"'$  (1.6 m)   Wt 164 lb (74.4 kg)   SpO2 96%   BMI 29.05 kg/m       10/24/2021    8:09 AM 10/20/2021    7:37 AM 09/21/2021    9:10 AM  BP/Weight  Systolic BP  998 338  Diastolic BP  70 79  Wt. (Lbs) 164 165 172.8  BMI 29.05 kg/m2 28.32 kg/m2 29.66 kg/m2    Physical Exam Vitals reviewed.  Constitutional:      Appearance: Normal appearance.  Cardiovascular:     Rate and Rhythm: Normal rate and regular rhythm.     Pulses: Normal pulses.     Heart sounds: Normal heart sounds.  Pulmonary:     Effort: Pulmonary effort is normal.     Breath sounds: Normal breath sounds.  Skin:    General: Skin is warm and dry.     Capillary Refill: Capillary refill takes less than 2 seconds.     Findings: Wound present.          Comments: Horizontal laceration approximately 4 cm in length to left lower anterior leg,without erythema, warmth, odor or drainage. serosanguineous fluid-filled blister noted  to center of laceration, approximately 2 cm in diameter.  Neurological:     General: No focal deficit present.     Mental Status: She is alert and oriented to person, place, and time.  Psychiatric:  Mood and Affect: Mood normal.        Behavior: Behavior normal.        Lab Results  Component Value Date   WBC 12.4 (H) 08/17/2021   HGB 11.4 08/17/2021   HCT 34.6 08/17/2021   PLT 378 08/17/2021   GLUCOSE 89 08/24/2021   CHOL 172 08/17/2021   TRIG 100 08/17/2021   HDL 65 08/17/2021   LDLCALC 89 08/17/2021   ALT 19 08/24/2021   AST 19 08/24/2021   NA 140 08/24/2021   K 5.3 (H) 08/24/2021   CL 103 08/24/2021   CREATININE 2.44 (H) 08/24/2021   BUN 46 (H) 08/24/2021   CO2 23 08/24/2021   HGBA1C 5.6 04/04/2021      Assessment & Plan:   1. Laceration of left lower extremity, subsequent encounter  -continue Keflex as prescribed -continued wound care and elevating left leg -notify office if wound worsens or becomes painful  Follow-up: 11/23/21 at 8:00 at fasting   An After Visit Summary was printed and given to the patient.  I, Rip Harbour, NP, have reviewed all documentation for this visit. The documentation on 10/24/21 for the exam, diagnosis, procedures, and orders are all accurate and complete.    Signed, Rip Harbour, NP [employer] [phone]

## 2021-10-25 LAB — COMPREHENSIVE METABOLIC PANEL
ALT: 20 IU/L (ref 0–32)
AST: 16 IU/L (ref 0–40)
Albumin/Globulin Ratio: 1.7 (ref 1.2–2.2)
Albumin: 3.9 g/dL (ref 3.7–4.7)
Alkaline Phosphatase: 84 IU/L (ref 44–121)
BUN/Creatinine Ratio: 18 (ref 12–28)
BUN: 39 mg/dL — ABNORMAL HIGH (ref 8–27)
Bilirubin Total: <0.2 mg/dL (ref 0.0–1.2)
CO2: 20 mmol/L (ref 20–29)
Calcium: 9.4 mg/dL (ref 8.7–10.3)
Chloride: 107 mmol/L — ABNORMAL HIGH (ref 96–106)
Creatinine, Ser: 2.12 mg/dL — ABNORMAL HIGH (ref 0.57–1.00)
Globulin, Total: 2.3 g/dL (ref 1.5–4.5)
Glucose: 113 mg/dL — ABNORMAL HIGH (ref 70–99)
Potassium: 5.5 mmol/L — ABNORMAL HIGH (ref 3.5–5.2)
Sodium: 143 mmol/L (ref 134–144)
Total Protein: 6.2 g/dL (ref 6.0–8.5)
eGFR: 23 mL/min/{1.73_m2} — ABNORMAL LOW (ref >59)

## 2021-10-25 LAB — CBC WITH DIFFERENTIAL/PLATELET
Basophils Absolute: 0.1 10*3/uL (ref 0.0–0.2)
Basos: 1 %
EOS (ABSOLUTE): 0.1 10*3/uL (ref 0.0–0.4)
Eos: 1 %
Hematocrit: 35.9 % (ref 34.0–46.6)
Hemoglobin: 11.6 g/dL (ref 11.1–15.9)
Immature Grans (Abs): 0.1 10*3/uL (ref 0.0–0.1)
Immature Granulocytes: 1 %
Lymphocytes Absolute: 2.3 10*3/uL (ref 0.7–3.1)
Lymphs: 24 %
MCH: 30.4 pg (ref 26.6–33.0)
MCHC: 32.3 g/dL (ref 31.5–35.7)
MCV: 94 fL (ref 79–97)
Monocytes Absolute: 0.7 10*3/uL (ref 0.1–0.9)
Monocytes: 7 %
Neutrophils Absolute: 6.4 10*3/uL (ref 1.4–7.0)
Neutrophils: 66 %
Platelets: 402 10*3/uL (ref 150–450)
RBC: 3.82 x10E6/uL (ref 3.77–5.28)
RDW: 13.1 % (ref 11.7–15.4)
WBC: 9.6 10*3/uL (ref 3.4–10.8)

## 2021-10-25 LAB — LIPID PANEL
Chol/HDL Ratio: 2.4 ratio (ref 0.0–4.4)
Cholesterol, Total: 133 mg/dL (ref 100–199)
HDL: 55 mg/dL (ref >39)
LDL Chol Calc (NIH): 63 mg/dL (ref 0–99)
Triglycerides: 75 mg/dL (ref 0–149)
VLDL Cholesterol Cal: 15 mg/dL (ref 5–40)

## 2021-10-25 LAB — CARDIOVASCULAR RISK ASSESSMENT

## 2021-11-01 DIAGNOSIS — Z20828 Contact with and (suspected) exposure to other viral communicable diseases: Secondary | ICD-10-CM | POA: Diagnosis not present

## 2021-11-02 DIAGNOSIS — C672 Malignant neoplasm of lateral wall of bladder: Secondary | ICD-10-CM | POA: Diagnosis not present

## 2021-11-02 DIAGNOSIS — N2889 Other specified disorders of kidney and ureter: Secondary | ICD-10-CM | POA: Diagnosis not present

## 2021-11-07 DIAGNOSIS — Z20828 Contact with and (suspected) exposure to other viral communicable diseases: Secondary | ICD-10-CM | POA: Diagnosis not present

## 2021-11-08 ENCOUNTER — Ambulatory Visit (INDEPENDENT_AMBULATORY_CARE_PROVIDER_SITE_OTHER): Payer: Medicare Other | Admitting: Vascular Surgery

## 2021-11-08 ENCOUNTER — Ambulatory Visit (HOSPITAL_COMMUNITY)
Admission: RE | Admit: 2021-11-08 | Discharge: 2021-11-08 | Disposition: A | Discharge status: Home / Self Care | Payer: Medicare Other | Source: Ambulatory Visit | Attending: Vascular Surgery | Admitting: Vascular Surgery

## 2021-11-08 ENCOUNTER — Encounter: Payer: Self-pay | Admitting: Vascular Surgery

## 2021-11-08 VITALS — BP 172/80 | HR 78 | Temp 97.7°F | Resp 14 | Ht 63.0 in | Wt 164.0 lb

## 2021-11-08 DIAGNOSIS — I890 Lymphedema, not elsewhere classified: Secondary | ICD-10-CM

## 2021-11-08 DIAGNOSIS — I872 Venous insufficiency (chronic) (peripheral): Secondary | ICD-10-CM | POA: Diagnosis not present

## 2021-11-08 DIAGNOSIS — I70219 Atherosclerosis of native arteries of extremities with intermittent claudication, unspecified extremity: Secondary | ICD-10-CM | POA: Diagnosis not present

## 2021-11-08 NOTE — Progress Notes (Addendum)
REASON FOR VISIT:   Follow-up of lymphedema  MEDICAL ISSUES:   COMBINED LYMPHEDEMA AND CHRONIC VENOUS INSUFFICIENCY: This patient has combined lymphedema and chronic venous insufficiency.  She has been elevating her legs and using an Ace bandage for compression.  My plan was to get her fitted for compression stocking this visit and consider working on getting theBioTAB lymphedema pump.  However she is injured her leg from a fall and so we will need to wait before considering these measures.  She will continue to elevate her leg and use the compression dressing with an Ace.  Once the wounds have healed she will contact us if she would like to come in and get fitted for a knee-high compression stocking or have [place] make a referral for the lymphedema pump.  I did explain that her duplex study today did show evidence of deep venous reflux but that the treatment for this is exactly the same as for her lymphedema.  That is, elevation and compression.  HPI:   Alexandra [name] is a pleasant 81 y.o. female who I saw on 09/21/2021 with lymphedema.  At that time we discussed conservative measures including leg elevation and compression therapy.  I encouraged her to avoid prolonged sitting and standing.  We discussed the importance of exercise and the importance of maintaining a healthy weight.  She comes in for a 1 month follow-up visit.  I did want to get formal venous reflux testing on the left to rule out any significant venous disease.  I thought you for swelling was not improving she might be a candidate for a BioTAB lymphedema pump.  Since I saw her last she has been elevating her leg and using the Ace bandage for compression.  However about 2 weeks ago she dropped something on her left leg and cut her leg and has a significant bruise.  She denies significant pain associated with her left leg.  Her swelling I think is improved some.  The swelling is more significant on the left side.  She denies any chest  pain or shortness of breath.  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   Hypertension    Osteoporosis    Restless legs syndrome    RLS (restless legs syndrome)     Family History  Problem Relation Age of Onset   Emphysema Mother    Hyperlipidemia Sister    Hypertension Sister    COPD Sister    Hyperlipidemia Sister    Hypertension Sister    Cancer Sister        lymphoma   Obesity Brother    Hyperlipidemia Brother    Hypertension Brother     SOCIAL HISTORY: Social History   Tobacco Use   Smoking status: Former    Packs/day: 1.00    Years: 50.00    Pack years: 50.00    Types: Cigarettes    Quit date: 06/11/2005    Years since quitting: 16.4   Smokeless tobacco: Never  Substance Use Topics   Alcohol use: No    Allergies  Allergen Reactions   Contrast Media [Iodinated Contrast Media] Hives, Itching and Rash   Shellfish Allergy Nausea And Vomiting   Shellfish-Derived Products Nausea And Vomiting    Current Outpatient Medications  Medication Sig Dispense Refill   alendronate (FOSAMAX) 70 MG tablet Take 1 tablet (  70 mg total) by mouth every 7 (seven) days. Take with a full glass of water on an empty stomach. 4 tablet 11   atorvastatin (LIPITOR) 40 MG tablet Take 1 tablet (40 mg total) by mouth daily. Per ccm 90 tablet 3   Budeson-Glycopyrrol-Formoterol (BREZTRI AEROSPHERE) 160-9-4.8 MCG/ACT AERO Inhale 2 puffs into the lungs in the morning and at bedtime. 10.7 g 6   esomeprazole (NEXIUM) 40 MG capsule Take 40 mg by mouth daily as needed.      metoprolol succinate (TOPROL-XL) 25 MG 24 hr tablet Take 1 tablet (25 mg total) by mouth daily. 90 tablet 3   rOPINIRole (REQUIP) 2 MG tablet Take 1 tablet (2 mg total) by mouth in the morning and at bedtime. 180 tablet 6   torsemide (DEMADEX) 20 MG tablet Take 1 tablet  (20 mg total) by mouth daily. (Patient taking differently: Take 60 mg by mouth daily.) 30 tablet 3   VENTOLIN HFA 108 (90 Base) MCG/ACT inhaler SMARTSIG:1-2 Puff(s) By Mouth Every 4 Hours PRN     cephALEXin (KEFLEX) 500 MG capsule Take 1 capsule (500 mg total) by mouth 2 (two) times daily. (Patient not taking: Reported on 11/08/2021) 14 capsule 0   No current facility-administered medications for this visit.    REVIEW OF SYSTEMS:  '[X]'$  denotes positive finding, '[ ]'$  denotes negative finding Cardiac  Comments:  Chest pain or chest pressure:    Shortness of breath upon exertion:    Short of breath when lying flat:    Irregular heart rhythm:        Vascular    Pain in calf, thigh, or hip brought on by ambulation:    Pain in feet at night that wakes you up from your sleep:     Blood clot in your veins:    Leg swelling:  x       Pulmonary    Oxygen at home:    Productive cough:     Wheezing:         Neurologic    Sudden weakness in arms or legs:     Sudden numbness in arms or legs:     Sudden onset of difficulty speaking or slurred speech:    Temporary loss of vision in one eye:     Problems with dizziness:         Gastrointestinal    Blood in stool:     Vomited blood:         Genitourinary    Burning when urinating:     Blood in urine:        Psychiatric    Major depression:         Hematologic    Bleeding problems:    Problems with blood clotting too easily:        Skin    Rashes or ulcers:        Constitutional    Fever or chills:     PHYSICAL EXAM:   Vitals:   11/08/21 1306  Resp: 14  Weight: 164 lb (74.4 kg)  Height: '5\' 3"'$  (1.6 m)    GENERAL: The patient is a well-nourished female, in no acute distress. The vital signs are documented above. CARDIAC: There is a regular rate and rhythm.  VASCULAR: I do not detect carotid bruits. She has palpable dorsalis pedis pulses. She has bilateral lower extremity swelling which is more significant on the left  side.   PULMONARY: There is good air exchange bilaterally without wheezing or  rales. ABDOMEN: Soft and non-tender with normal pitched bowel sounds.  MUSCULOSKELETAL: There are no major deformities or cyanosis. NEUROLOGIC: No focal weakness or paresthesias are detected. SKIN: There are no ulcers or rashes noted. PSYCHIATRIC: The patient has a normal affect.  DATA:    VENOUS DUPLEX: I have independently interpreted her venous duplex scan today.  This was of the left lower extremity only.  This shows no evidence of DVT on the left.  There is deep venous reflux involving the common femoral vein and femoral vein.  There is no significant superficial venous reflux.  Deitra Mayo [facility] [phone]

## 2021-11-11 DIAGNOSIS — J439 Emphysema, unspecified: Secondary | ICD-10-CM | POA: Diagnosis not present

## 2021-11-11 DIAGNOSIS — M6289 Other specified disorders of muscle: Secondary | ICD-10-CM | POA: Diagnosis not present

## 2021-11-11 DIAGNOSIS — Z043 Encounter for examination and observation following other accident: Secondary | ICD-10-CM | POA: Diagnosis not present

## 2021-11-11 DIAGNOSIS — S40011A Contusion of right shoulder, initial encounter: Secondary | ICD-10-CM | POA: Diagnosis not present

## 2021-11-11 DIAGNOSIS — S51811A Laceration without foreign body of right forearm, initial encounter: Secondary | ICD-10-CM | POA: Diagnosis not present

## 2021-11-23 ENCOUNTER — Ambulatory Visit (INDEPENDENT_AMBULATORY_CARE_PROVIDER_SITE_OTHER): Payer: Medicare Other | Admitting: Nurse Practitioner

## 2021-11-23 ENCOUNTER — Encounter: Payer: Self-pay | Admitting: Nurse Practitioner

## 2021-11-23 VITALS — BP 140/86 | HR 78 | Temp 97.0°F | Resp 18 | Ht 63.0 in | Wt 160.0 lb

## 2021-11-23 DIAGNOSIS — M898X2 Other specified disorders of bone, upper arm: Secondary | ICD-10-CM | POA: Diagnosis not present

## 2021-11-23 DIAGNOSIS — R296 Repeated falls: Secondary | ICD-10-CM

## 2021-11-23 DIAGNOSIS — I129 Hypertensive chronic kidney disease with stage 1 through stage 4 chronic kidney disease, or unspecified chronic kidney disease: Secondary | ICD-10-CM | POA: Insufficient documentation

## 2021-11-23 DIAGNOSIS — E782 Mixed hyperlipidemia: Secondary | ICD-10-CM

## 2021-11-23 DIAGNOSIS — I10 Essential (primary) hypertension: Secondary | ICD-10-CM | POA: Diagnosis not present

## 2021-11-23 DIAGNOSIS — M810 Age-related osteoporosis without current pathological fracture: Secondary | ICD-10-CM

## 2021-11-23 DIAGNOSIS — N184 Chronic kidney disease, stage 4 (severe): Secondary | ICD-10-CM

## 2021-11-23 DIAGNOSIS — N185 Chronic kidney disease, stage 5: Secondary | ICD-10-CM | POA: Diagnosis present

## 2021-11-23 LAB — COMPREHENSIVE METABOLIC PANEL
ALT: 14 IU/L (ref 0–32)
AST: 19 IU/L (ref 0–40)
Albumin/Globulin Ratio: 2 (ref 1.2–2.2)
Albumin: 3.9 g/dL (ref 3.7–4.7)
Alkaline Phosphatase: 105 IU/L (ref 44–121)
BUN/Creatinine Ratio: 15 (ref 12–28)
BUN: 29 mg/dL — ABNORMAL HIGH (ref 8–27)
Bilirubin Total: 0.3 mg/dL (ref 0.0–1.2)
CO2: 20 mmol/L (ref 20–29)
Calcium: 9.4 mg/dL (ref 8.7–10.3)
Chloride: 107 mmol/L — ABNORMAL HIGH (ref 96–106)
Creatinine, Ser: 1.95 mg/dL — ABNORMAL HIGH (ref 0.57–1.00)
Globulin, Total: 2 g/dL (ref 1.5–4.5)
Glucose: 99 mg/dL (ref 70–99)
Potassium: 5.6 mmol/L — ABNORMAL HIGH (ref 3.5–5.2)
Sodium: 141 mmol/L (ref 134–144)
Total Protein: 5.9 g/dL — ABNORMAL LOW (ref 6.0–8.5)
eGFR: 26 mL/min/{1.73_m2} — ABNORMAL LOW (ref >59)

## 2021-11-23 MED ORDER — DENOSUMAB 60 MG/ML ~~LOC~~ SOSY: 60.0000 mg | PREFILLED_SYRINGE | Freq: Once | SUBCUTANEOUS | 0 refills | Status: AC

## 2021-11-23 MED ORDER — TRAMADOL-ACETAMINOPHEN 37.5-325 MG PO TABS: 1.0000 | ORAL_TABLET | Freq: Three times a day (TID) | ORAL | 0 refills | Status: DC | PRN

## 2021-11-23 NOTE — Progress Notes (Deleted)
Subjective:  Patient ID: Alexandra [name], female    DOB: 11/23/1940  Age: 81 y.o. MRN: [id number]  Chief Complaint  Patient presents with   Hyperlipidemia    HPI   Current Outpatient Medications on File Prior to Visit  Medication Sig Dispense Refill   alendronate (FOSAMAX) 70 MG tablet Take 1 tablet (70 mg total) by mouth every 7 (seven) days. Take with a full glass of water on an empty stomach. 4 tablet 11   atorvastatin (LIPITOR) 40 MG tablet Take 1 tablet (40 mg total) by mouth daily. Per ccm 90 tablet 3   Budeson-Glycopyrrol-Formoterol (BREZTRI AEROSPHERE) 160-9-4.8 MCG/ACT AERO Inhale 2 puffs into the lungs in the morning and at bedtime. 10.7 g 6   esomeprazole (NEXIUM) 40 MG capsule Take 40 mg by mouth daily as needed.      metoprolol succinate (TOPROL-XL) 25 MG 24 hr tablet Take 1 tablet (25 mg total) by mouth daily. 90 tablet 3   rOPINIRole (REQUIP) 2 MG tablet Take 1 tablet (2 mg total) by mouth in the morning and at bedtime. 180 tablet 6   VENTOLIN HFA 108 (90 Base) MCG/ACT inhaler SMARTSIG:1-2 Puff(s) By Mouth Every 4 Hours PRN     torsemide (DEMADEX) 20 MG tablet Take 1 tablet (20 mg total) by mouth daily. (Patient not taking: Reported on 11/23/2021) 30 tablet 3   No current facility-administered medications on file prior to visit.   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 81 yo   BACK SURGERY  2016   lower L 4 to L 5   bladder tack and uterus lift  10/10/2015   IR RADIOLOGIST EVAL & MGMT  12/03/2018   IR RADIOLOGIST EVAL & MGMT  12/31/2018   PARATHYROIDECTOMY Left 07/02/2016   Procedure: LEFT INFERIOR  PARATHYROIDECTOMY;  Surgeon: Armandina Gemma, MD;  Location: [facility];  Service: General;  Laterality: Left;   SHOULDER OPEN ROTATOR CUFF REPAIR  12/2019   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    Family History  Problem Relation Age of Onset   Emphysema Mother    Hyperlipidemia Sister    Hypertension Sister    COPD Sister    Hyperlipidemia Sister    Hypertension Sister    Cancer Sister        lymphoma   Obesity Brother    Hyperlipidemia Brother    Hypertension Brother    Social History   Socioeconomic History   Marital status: Married    Spouse name: Not on file   Number of children: Not on file   Years of education: Not on file   Highest education level: Not on file  Occupational History   Not on file  Tobacco Use   Smoking status: Former    Packs/day: 1.00    Years: 50.00    Total pack years: 50.00    Types: Cigarettes    Quit date: 06/11/2005    Years since quitting: 16.4   Smokeless tobacco: Never  Vaping Use   Vaping Use: Never used  Substance and Sexual Activity   Alcohol use: No   Drug use:  No   Sexual activity: Yes    Partners: Male  Other Topics Concern   Not on file  Social History Narrative   Not on file   Social Determinants of Health   Financial Resource Strain: Low Risk  (10/24/2021)   Overall Financial Resource Strain (CARDIA)    Difficulty of Paying Living Expenses: Not hard at all  Recent Concern: Financial Resource Strain - High Risk (09/19/2021)   Overall Financial Resource Strain (CARDIA)    Difficulty of Paying Living Expenses: Hard  Food Insecurity: No Food Insecurity (10/24/2021)   Hunger Vital Sign    Worried About Running Out of Food in the Last Year: Never true    Ran Out of Food in the Last Year: Never true  Transportation Needs: No Transportation Needs (09/19/2021)   PRAPARE - Hydrologist (Medical): No    Lack of Transportation (Non-Medical): No  Physical Activity: Inactive (10/24/2021)   Exercise  Vital Sign    Days of Exercise per Week: 0 days    Minutes of Exercise per Session: 0 min  Stress: No Stress Concern Present (10/24/2021)   [place]    Feeling of Stress : Not at all  Social Connections: Moderately Integrated (10/24/2021)   Social Connection and Isolation Panel [NHANES]    Frequency of Communication with Friends and Family: More than three times a week    Frequency of Social Gatherings with Friends and Family: More than three times a week    Attends Religious Services: More than 4 times per year    Active Member of [employer] or Organizations: No    Attends Archivist Meetings: Never    Marital Status: Married    Review of Systems  Constitutional:  Negative for chills, fatigue and fever.  HENT:  Negative for congestion, rhinorrhea and sore throat.   Respiratory:  Positive for cough and shortness of breath.   Cardiovascular:  Negative for chest pain.  Gastrointestinal:  Negative for abdominal pain, constipation, diarrhea, nausea and vomiting.  Genitourinary:  Negative for dysuria and urgency.  Musculoskeletal:  Positive for gait problem and myalgias (right arm pain). Negative for back pain.  Skin:        Large abrasion right elbow  Neurological:  Positive for headaches. Negative for dizziness, weakness and light-headedness.  Psychiatric/Behavioral:  Negative for dysphoric mood. The patient is not nervous/anxious.      Objective:  BP 140/86   Pulse 78   Temp (!) 97 F (36.1 C)   Resp 18   Ht '5\' 3"'$  (1.6 m)   Wt 160 lb (72.6 kg)   BMI 28.34 kg/m      11/23/2021    7:27 AM 11/08/2021    1:06 PM 10/24/2021    8:09 AM  BP/Weight  Systolic BP 834 196 222  Diastolic BP 86 80 74  Wt. (Lbs) 160 164 164  BMI 28.34 kg/m2 29.05 kg/m2 29.05 kg/m2    Physical Exam  Diabetic Foot Exam - Simple   No data filed      Lab Results  Component Value Date   WBC 9.6 10/24/2021   HGB 11.6  10/24/2021   HCT 35.9 10/24/2021   PLT 402 10/24/2021   GLUCOSE 113 (H) 10/24/2021   CHOL 133 10/24/2021   TRIG 75 10/24/2021   HDL 55 10/24/2021   LDLCALC 63 10/24/2021   ALT 20 10/24/2021   AST 16 10/24/2021   NA 143  10/24/2021   K 5.5 (H) 10/24/2021   CL 107 (H) 10/24/2021   CREATININE 2.12 (H) 10/24/2021   BUN 39 (H) 10/24/2021   CO2 20 10/24/2021   HGBA1C 5.6 04/04/2021      Assessment & Plan:   Problem List Items Addressed This Visit   None .  No orders of the defined types were placed in this encounter.   No orders of the defined types were placed in this encounter.    Follow-up: No follow-ups on file.  An After Visit Summary was printed and given to the patient.  Rip Harbour, NP [place] [phone]

## 2021-11-23 NOTE — Progress Notes (Signed)
Established Patient Office Visit  Subjective   Patient ID: Alexandra [name], female    DOB: 08-Jan-1941  Age: 81 y.o. MRN: [id number]  CC HTN Hyperlipidemia GERD  HPI Alexandra [name] presents for chronic f/u pf HTN, hyperlipidemia, and GERD. She has stage 4 CKD, she is followed by nephrology, Dr Alexandra [name]. She has experienced several falls recently. She was seen at [facility] ED on 11/11/21 after falling. She is currently wearing a sling to right arm, abrasion present, pt c/o pain and decreased ROM to right upper arm. She has requested an ortho referral and refill of pain medication. She was recently diagnosed with osteoporosis, T score -4.6, prescribed Fosamax. Given her increase in falls, she may benefit from Prolia. Alexandra [name] is followed by Dr Alexandra [name] for bilateral lower extremity lymphedema and chronic venous insufficiency. Plans to have fitting for  bilateral compression stockings in the near future. Delayed due to an abrasion to left lower anterior leg she sustained after tripping over her spouse's w/c.    Hypertension, follow-up: She was last seen for hypertension 3 months ago.  BP at that visit was 130/72. Management includes Metoprolol 25 mg QD.  She reports excellent compliance with treatment. She is not having side effects.  She is following a Low Sodium diet. She is not exercising. She does not smoke.  Use of agents associated with hypertension: none.   Outside blood pressures are not being checked.  Pertinent labs: Lab Results  Component Value Date   CHOL 133 10/24/2021   HDL 55 10/24/2021   LDLCALC 63 10/24/2021   TRIG 75 10/24/2021   CHOLHDL 2.4 10/24/2021   Lab Results  Component Value Date   NA 143 10/24/2021   K 5.5 (H) 10/24/2021   CREATININE 2.12 (H) 10/24/2021   EGFR 23 (L) 10/24/2021   GFRNONAA 25 (L) 03/28/2020   GLUCOSE 113 (H) 10/24/2021     The 10-year ASCVD risk score (Arnett DK, et al., 2019) is: 33.1%    Lipid/Cholesterol,  Follow-up  Last lipid panel Other pertinent labs  Lab Results  Component Value Date   CHOL 133 10/24/2021   HDL 55 10/24/2021   LDLCALC 63 10/24/2021   TRIG 75 10/24/2021   CHOLHDL 2.4 10/24/2021   Lab Results  Component Value Date   ALT 20 10/24/2021   AST 16 10/24/2021   PLT 402 10/24/2021     She was last seen for this 3 months ago.  Management includes Lipitor 40 mg QD.  She reports excellent compliance with treatment. She is not having side effects.   Current diet: well balanced Current exercise: housecleaning  The 10-year ASCVD risk score (Arnett DK, et al., 2019) is: 33.1%   GERD, Follow up:  The patient was last seen for GERD 3 months ago. Current treatment consist EX:NTZGYF 40 mg QD, avoiding foods that trigger GERD  She reports excellent compliance with treatment. She is not having side effects. She is NOT experiencing choking on food, difficulty swallowing, or heartburn       ROS See pertinent positives and negatives per HPI.    Objective:     BP 140/86   Pulse 78   Temp (!) 97 F (36.1 C)   Resp 18   Ht '5\' 3"'  (1.6 m)   Wt 160 lb (72.6 kg)   BMI 28.34 kg/m     Physical Exam Vitals reviewed.  HENT:     Right Ear: Tympanic membrane normal.     Left Ear: Tympanic membrane normal.  Cardiovascular:     Rate and Rhythm: Normal rate and regular rhythm.     Pulses: Normal pulses.     Heart sounds: Normal heart sounds.  Pulmonary:     Effort: Pulmonary effort is normal.     Breath sounds: Normal breath sounds.  Abdominal:     General: Bowel sounds are normal.     Palpations: Abdomen is soft.  Musculoskeletal:        General: Swelling and tenderness present.     Right shoulder: Tenderness present. Decreased range of motion.     Right upper arm: Tenderness present.     Right elbow: Decreased range of motion.     Left elbow: Normal range of motion.     Right lower leg: 2+ Pitting Edema present.     Left lower leg: 2+ Pitting Edema  present.     Right foot: Swelling present.     Left foot: Swelling present.  Skin:    Capillary Refill: Capillary refill takes less than 2 seconds.     Findings: Abrasion present.       Neurological:     General: No focal deficit present.     Mental Status: She is alert and oriented to person, place, and time.  Psychiatric:        Mood and Affect: Mood normal.        Behavior: Behavior normal.         Assessment & Plan:   1. Essential hypertension - Comprehensive metabolic panel  2. Age-related osteoporosis without current pathological fracture - denosumab (PROLIA) 60 MG/ML SOSY injection; Inject 60 mg into the skin once for 1 dose.  Dispense: 1 mL; Refill: 0  3. 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 Pain of right humerus - denosumab (PROLIA) 60 MG/ML SOSY injection; Inject 60 mg into the skin once for 1 dose.  Dispense: 1 mL; Refill: 0 - Ambulatory referral to Orthopedic Surgery - traMADol-acetaminophen (ULTRACET) 37.5-325 MG tablet; Take 1 tablet by mouth every 8 (eight) hours as needed.  Dispense: 15 tablet; Refill: 0  5. Frequent falls - denosumab (PROLIA) 60 MG/ML SOSY injection; Inject 60 mg into the skin once for 1 dose.  Dispense: 1 mL; Refill: 0    Continue medications  We will call you with lab results, Prolia injection, and orthopedic doctor Fall precautions in the home Follow-up in 52-month, fasting   Follow-up in 355-monthfasting  I, ShRip HarbourNP, have reviewed all documentation for this visit. The documentation on 11/23/21 for the exam, diagnosis, procedures, and orders are all accurate and complete.    Signed, ShRip HarbourNP 11/23/21 at 9:26

## 2021-11-23 NOTE — Patient Instructions (Addendum)
Continue medications  We will call you with lab results, Prolia injection, and orthopedic doctor Fall precautions in the home Follow-up in 50-month, fasting   Fall Prevention in the Home, Adult Falls can cause injuries and can happen to people of all ages. There are many things you can do to make your home safe and to help prevent falls. Ask for help when making these changes. What actions can I take to prevent falls? General Instructions Use good lighting in all rooms. Replace any light bulbs that burn out. Turn on the lights in dark areas. Use night-lights. Keep items that you use often in easy-to-reach places. Lower the shelves around your home if needed. Set up your furniture so you have a clear path. Avoid moving your furniture around. Do not have throw rugs or other things on the floor that can make you trip. Avoid walking on wet floors. If any of your floors are uneven, fix them. Add color or contrast paint or tape to clearly mark and help you see: Grab bars or handrails. First and last steps of staircases. Where the edge of each step is. If you use a stepladder: Make sure that it is fully opened. Do not climb a closed stepladder. Make sure the sides of the stepladder are locked in place. Ask someone to hold the stepladder while you use it. Know where your pets are when moving through your home. What can I do in the bathroom?     Keep the floor dry. Clean up any water on the floor right away. Remove soap buildup in the tub or shower. Use nonskid mats or decals on the floor of the tub or shower. Attach bath mats securely with double-sided, nonslip rug tape. If you need to sit down in the shower, use a plastic, nonslip stool. Install grab bars by the toilet and in the tub and shower. Do not use towel bars as grab bars. What can I do in the bedroom? Make sure that you have a light by your bed that is easy to reach. Do not use any sheets or blankets for your bed that hang to  the floor. Have a firm chair with side arms that you can use for support when you get dressed. What can I do in the kitchen? Clean up any spills right away. If you need to reach something above you, use a step stool with a grab bar. Keep electrical cords out of the way. Do not use floor polish or wax that makes floors slippery. What can I do with my stairs? Do not leave any items on the stairs. Make sure that you have a light switch at the top and the bottom of the stairs. Make sure that there are handrails on both sides of the stairs. Fix handrails that are broken or loose. Install nonslip stair treads on all your stairs. Avoid having throw rugs at the top or bottom of the stairs. Choose a carpet that does not hide the edge of the steps on the stairs. Check carpeting to make sure that it is firmly attached to the stairs. Fix carpet that is loose or worn. What can I do on the outside of my home? Use bright outdoor lighting. Fix the edges of walkways and driveways and fix any cracks. Remove anything that might make you trip as you walk through a door, such as a raised step or threshold. Trim any bushes or trees on paths to your home. Check to see if handrails are loose  or broken and that both sides of all steps have handrails. Install guardrails along the edges of any raised decks and porches. Clear paths of anything that can make you trip, such as tools or rocks. Have leaves, snow, or ice cleared regularly. Use sand or salt on paths during winter. Clean up any spills in your garage right away. This includes grease or oil spills. What other actions can I take? Wear shoes that: Have a low heel. Do not wear high heels. Have rubber bottoms. Feel good on your feet and fit well. Are closed at the toe. Do not wear open-toe sandals. Use tools that help you move around if needed. These include: Canes. Walkers. Scooters. Crutches. Review your medicines with your doctor. Some medicines can  make you feel dizzy. This can increase your chance of falling. Ask your doctor what else you can do to help prevent falls. Where to find more information Centers for Disease Control and Prevention, [employer]: http://www.wolf.info/ National Institute on Aging: http://kim-miller.com/ Contact a doctor if: You are afraid of falling at home. You feel weak, drowsy, or dizzy at home. You fall at home. Summary There are many simple things that you can do to make your home safe and to help prevent falls. Ways to make your home safe include removing things that can make you trip and installing grab bars in the bathroom. Ask for help when making these changes in your home. This information is not intended to replace advice given to you by your health care provider. Make sure you discuss any questions you have with your health care provider. Document Revised: 02/27/2021 Document Reviewed: 12/30/2019 Elsevier Patient Education  [place]. Osteoporosis  Osteoporosis is when the bones get thin and weak. This can cause your bones to break (fracture) more easily. What are the causes? The exact cause of this condition is not known. What increases the risk? Having family members with this condition. Not eating enough healthy foods. Taking certain medicines. Being female. Being age 78 or older. Smoking or using other products that contain nicotine or tobacco, such as e-cigarettes or chewing tobacco. Not exercising. Being of European or Asian ancestry. Having a small body frame. What are the signs or symptoms? A broken bone might be the first sign, especially if the break results from a fall or injury that usually would not cause a bone to break. Other signs and symptoms include: Pain in the neck or low back. Being hunched over (stooped posture). Getting shorter. How is this treated? Eating more foods with more calcium and vitamin D in them. Doing exercises. Stopping tobacco use. Limiting how much alcohol  you drink. Taking medicines to slow bone loss or help make the bones stronger. Taking supplements of calcium and vitamin D every day. Taking medicines to replace chemicals in the body (hormone replacement medicines). Monitoring your levels of calcium and vitamin D. The goal of treatment is to strengthen your bones and lower your risk for a bone break. Follow these instructions at home: Eating and drinking Eat plenty of calcium and vitamin D. These nutrients are good for your bones. Good sources of calcium and vitamin D include: Some fish, such as salmon and tuna. Foods that have calcium and vitamin D added to them (fortified foods), such as some breakfast cereals. Egg yolks. Cheese. Liver.  Activity Do exercises as told by your doctor. Ask your doctor what exercises are safe for you. You should do: Exercises that make your muscles work to hold your body  weight up (weight-bearing exercises). These include tai chi, yoga, and walking. Exercises to make your muscles stronger. One example is lifting weights. Lifestyle Do not drink alcohol if: Your doctor tells you not to drink. You are pregnant, may be pregnant, or are planning to become pregnant. If you drink alcohol: Limit how much you use to: 0-1 drink a day for women. 0-2 drinks a day for men. Know how much alcohol is in your drink. In the [place] one drink equals one 12 oz bottle of beer (355 mL), one 5 oz glass of wine (148 mL), or one 1 oz glass of hard liquor (44 mL). Do not smoke or use any products that contain nicotine or tobacco. If you need help quitting, ask your doctor. Preventing falls Use tools to help you move around (mobility aids) as needed. These include canes, walkers, scooters, and crutches. Keep rooms well-lit. Put away things on the floor that could make you trip. These include cords and rugs. Install safety rails on stairs. Install grab bars in bathrooms. Use rubber mats in slippery areas, like bathrooms. Wear  shoes that: Fit you well. Support your feet. Have closed toes. Have rubber soles or low heels. Tell your doctor about all of the medicines you are taking. Some medicines can make you more likely to fall. General instructions Take over-the-counter and prescription medicines only as told by your doctor. Keep all follow-up visits. Contact a doctor if: You have not been tested (screened) for osteoporosis and you are: A woman who is age 70 or older. A man who is age 54 or older. Get help right away if: You fall. You get hurt. Summary Osteoporosis happens when your bones get thin and weak. Weak bones can break (fracture) more easily. Eat plenty of calcium and vitamin D. These are good for your bones. Tell your doctor about all of the medicines that you take. This information is not intended to replace advice given to you by your health care provider. Make sure you discuss any questions you have with your health care provider. Document Revised: 11/12/2019 Document Reviewed: 11/12/2019 Elsevier Patient Education  [place] for Chronic Kidney Disease Chronic kidney disease (CKD) occurs when the kidneys are permanently damaged over a long period of time. When your kidneys are not working well, they cannot remove waste, fluids, and other substances from your blood as well as they did before. The substances can build up, which can worsen kidney damage and affect how your body functions. Certain foods lead to a buildup of these substances. By changing your diet, you can help prevent more kidney damage and delay or prevent the need for dialysis. What are tips for following this plan? Reading food labels Check the amount of salt (sodium) in foods. Choose foods that have less than 300 milligrams (mg) per serving. Check the ingredient list for phosphorus or potassium-based additives or preservatives. Check the amount of saturated fat and trans fat. Limit or avoid these fats  as told by your dietitian. Shopping Avoid buying foods that are: Processed or prepackaged. Calcium-enriched or that have calcium added to them (are fortified). Do not buy foods that have salt or sodium listed among the first five ingredients. Buy canned vegetables and beans that say "no salt added" or "low sodium" and rinse them before eating. Cooking Soak vegetables, such as potatoes, before cooking to reduce potassium. To do this: Peel and cut the vegetables into small pieces. Soak the vegetables in warm water  for at least 2 hours. For every 1 cup of vegetables, use 10 cups of water. Drain and rinse the vegetables with warm water. Boil the vegetables for at least 5 minutes. Meal planning Limit the amount of protein you eat from plant and animal sources each day. Do not add salt to food when cooking or before eating. Eat meals and snacks at around the same time each day. General information Talk with your health care provider about whether you should take a vitamin and mineral supplement. Use standard measuring cups and spoons to measure servings of foods. Use a kitchen scale to measure portions of protein foods. If told by your health care provider, avoid drinking too much fluid. Measure and count all liquids, including water, ice, soups, flavored gelatin, and frozen desserts such as ice pops or ice cream. If you have diabetes: If you have diabetes (diabetes mellitus) and CKD, it is important to keep your blood sugar (glucose) in the target range recommended by your health care provider. Follow your diabetes management plan. This may include: Checking your blood glucose regularly. Taking medicines by mouth, taking insulin, or taking both. Exercising for at least 30 minutes on 5 or more days each week, or as told by your health care provider. Tracking how many servings of carbohydrates you eat at each meal. You may be given specific guidelines on how much of certain foods and nutrients  you may eat, depending on your stage of kidney disease and whether you have high blood pressure (hypertension). Follow your meal plan as told by your dietitian. What nutrients should I limit? Work with your health care provider and dietitian to develop a meal plan that is right for you. Foods you can eat and foods you should limit or avoid will depend on the stage of your kidney disease and any other health conditions you have. The items listed below are not a complete list. Talk with your dietitian about what dietary choices are best for you. Potassium Potassium affects how steadily your heart beats. If too much potassium builds up in your blood, the potassium can cause an irregular heartbeat or even a heart attack. You may need to limit or avoid foods that are high in potassium, such as: Milk and soy milk. Fruits, such as bananas, apricots, nectarines, melon, prunes, raisins, kiwi, and oranges. Vegetables, such as potatoes, sweet potatoes, yams, tomatoes, leafy greens, beets, avocado, pumpkin, and winter squash. White and lima beans. Whole-wheat breads and pastas. Beans and nuts. Phosphorus Phosphorus is a mineral found in your bones. A balance between calcium and phosphorus is needed to build and maintain healthy bones. Too much phosphorus pulls calcium from your bones. This can make your bones weak and more likely to break. Too much phosphorus can also make your skin itch. You may need to limit or avoid foods that are high in phosphorus, such as: Milk and dairy products. Dried beans and peas. Tofu, soy milk, and other soy-based meat replacements. Dark-colored sodas. Nuts and peanut butter. Meat, poultry, and fish. Bran cereals and oatmeal. Protein  Protein helps you make and keep muscle. It also helps to repair your body's cells and tissues. One of the natural breakdown products of protein is a waste product called urea. When your kidneys are not working properly, they cannot remove  wastes, such as urea. Reducing how much protein you eat can help prevent a buildup of urea in your blood. Depending on your stage of kidney disease, you may need to limit foods  that are high in protein. Sources of animal protein include: Meat (all types). Fish and seafood. Poultry. Eggs. Dairy. Other protein foods include: Beans and legumes. Nuts and nut butter. Soy and tofu.  Sodium Sodium helps to maintain a healthy balance of fluids in your body. Too much sodium can increase your blood pressure and have a negative effect on your heart and lungs. Too much sodium can also cause your body to retain too much fluid, making your kidneys work harder. Most people should have less than 2,300 mg of sodium each day. If you have hypertension, you may need to limit your sodium to 1,500 mg each day. You may need to limit or avoid foods that are high in sodium, such as: Salt seasonings. Soy sauce. Cured and processed meats. Salted crackers and snack foods. Fast food. Canned soups and most canned foods. Pickled foods. Vegetable juice. Boxed mixes or ready-to-eat boxed meals and side dishes. Bottled dressings, sauces, and marinades. Talk with your dietitian about how much potassium, phosphorus, protein, and sodium you may have each day. Summary Chronic kidney disease (CKD) can lead to a buildup of waste and extra substances in the body. Certain foods lead to a buildup of these substances. By changing your diet as told, you can help prevent more kidney damage and delay or prevent the need for dialysis. Food intake changes are different for each person with CKD. Work with a dietitian to set up nutrient goals and a meal plan that is right for you. If you have diabetes and CKD, it is important to keep your blood sugar in the target range recommended by your health care provider. This information is not intended to replace advice given to you by your health care provider. Make sure you discuss any  questions you have with your health care provider. Document Revised: 09/21/2019 Document Reviewed: 09/21/2019 Elsevier Patient Education  [place].

## 2021-11-27 DIAGNOSIS — G8929 Other chronic pain: Secondary | ICD-10-CM | POA: Diagnosis not present

## 2021-11-27 DIAGNOSIS — M25511 Pain in right shoulder: Secondary | ICD-10-CM | POA: Diagnosis not present

## 2021-11-28 ENCOUNTER — Other Ambulatory Visit: Payer: Self-pay | Admitting: Legal Medicine

## 2021-11-28 DIAGNOSIS — G2581 Restless legs syndrome: Secondary | ICD-10-CM

## 2021-11-30 DIAGNOSIS — M25511 Pain in right shoulder: Secondary | ICD-10-CM | POA: Diagnosis not present

## 2021-12-01 DIAGNOSIS — G8929 Other chronic pain: Secondary | ICD-10-CM | POA: Diagnosis not present

## 2021-12-01 DIAGNOSIS — M25511 Pain in right shoulder: Secondary | ICD-10-CM | POA: Diagnosis not present

## 2021-12-14 ENCOUNTER — Ambulatory Visit (INDEPENDENT_AMBULATORY_CARE_PROVIDER_SITE_OTHER): Payer: Medicare Other

## 2021-12-14 DIAGNOSIS — M810 Age-related osteoporosis without current pathological fracture: Secondary | ICD-10-CM | POA: Diagnosis not present

## 2021-12-14 MED ORDER — DENOSUMAB 60 MG/ML ~~LOC~~ SOSY
60.0000 mg | PREFILLED_SYRINGE | Freq: Once | SUBCUTANEOUS | Status: AC
  Administered 2021-12-14: 60 mg via SUBCUTANEOUS

## 2021-12-14 NOTE — Progress Notes (Signed)
Patient comes into the office for new start on Prolia.  Medication reviewed with patient with side effects.  Injection was given and tolerated well.

## 2021-12-21 ENCOUNTER — Telehealth: Payer: Self-pay

## 2021-12-21 NOTE — Progress Notes (Signed)
    Chronic Care Management Pharmacy Assistant   Name: XZANDRIA CLEVINGER  MRN: [id number] DOB: 05-Apr-1941   Reason for Encounter: General Adherence Call   Recent office visits:  12/14/21 Meredith Mody RN. Seen for Prolia '60mg'$  injection.  11/23/21 Jerrell Belfast NP. Seen for routine visit. Started on Prolia '60mg'$  and Tramadol-Acetaminophen 37.5-'325mg'$ . Referral to Orthopedic surgery.   10/24/21 Jerrell Belfast NP. Seen for laceration of lower extremity. No med changes.  10/20/21 Cox, Kirsten MD. Seen for laceration of lower extremity. Started on Cephalexin '500mg'$ .   09/19/21 Reinaldo Meeker MD. D/C Simvastatin '40mg'$  and Started on Atorvastatin '40mg'$ .   Recent consult visits:  11/08/21 (Vascular Surgery) Deitra Mayo MD. Seen for Lymphedema. No med changes.  09/21/21 (Vascular Surgery) Deitra Mayo MD. Seen for Lymphedema. Completed course of Diclofenac Sodium.   Hospital visits:  None  Medications: Outpatient Encounter Medications as of 12/21/2021  Medication Sig   atorvastatin (LIPITOR) 40 MG tablet Take 1 tablet (40 mg total) by mouth daily. Per ccm   Budeson-Glycopyrrol-Formoterol (BREZTRI AEROSPHERE) 160-9-4.8 MCG/ACT AERO Inhale 2 puffs into the lungs in the morning and at bedtime.   esomeprazole (NEXIUM) 40 MG capsule Take 40 mg by mouth daily as needed.    metoprolol succinate (TOPROL-XL) 25 MG 24 hr tablet Take 1 tablet (25 mg total) by mouth daily.   rOPINIRole (REQUIP) 2 MG tablet TAKE 1 TABLET(2 MG) BY MOUTH IN THE MORNING AND AT BEDTIME   torsemide (DEMADEX) 20 MG tablet Take 1 tablet (20 mg total) by mouth daily. (Patient not taking: Reported on 11/23/2021)   traMADol-acetaminophen (ULTRACET) 37.5-325 MG tablet Take 1 tablet by mouth every 8 (eight) hours as needed.   VENTOLIN HFA 108 (90 Base) MCG/ACT inhaler SMARTSIG:1-2 Puff(s) By Mouth Every 4 Hours PRN   No facility-administered encounter medications on file as of 12/21/2021.    Contacted [name]  for General Review Call   Chart Review:  Have there been any documented new, changed, or discontinued medications since last visit? Yes  Has there been any documented recent hospitalizations or ED visits since last visit with Clinical Pharmacist? No Brief Summary (including medication and/or Diagnosis changes):   Adherence Review:  Does the Clinical Pharmacist Assistant have access to adherence rates? Yes Adherence rates for STAR metric medications (List medication(s)/day supply/ last 2 fill dates). Atorvastatin 10/24/21-09/19/21 90ds Adherence rates for medications indicated for disease state being reviewed (List medication(s)/day supply/ last 2 fill dates). Does the patient have >5 day gap between last estimated fill dates for any of the above medications or other medication gaps? No Reason for medication gaps.   Disease State Questions:  Able to connect with Patient? No, unable to reach pt to complete this call    Elray Mcgregor, [place] Pharmacist Assistant  [phone]

## 2021-12-26 ENCOUNTER — Encounter: Payer: Self-pay | Admitting: Nurse Practitioner

## 2021-12-26 ENCOUNTER — Ambulatory Visit (INDEPENDENT_AMBULATORY_CARE_PROVIDER_SITE_OTHER): Payer: Medicare Other | Admitting: Nurse Practitioner

## 2021-12-26 VITALS — BP 134/68 | HR 96 | Temp 97.1°F | Ht 63.0 in | Wt 166.0 lb

## 2021-12-26 DIAGNOSIS — I890 Lymphedema, not elsewhere classified: Secondary | ICD-10-CM

## 2021-12-26 DIAGNOSIS — I872 Venous insufficiency (chronic) (peripheral): Secondary | ICD-10-CM

## 2021-12-26 NOTE — Progress Notes (Signed)
Acute Office Visit  Subjective:    Patient ID: Alexandra [name], female    DOB: [birth year], 81 y.o.   MRN: [id number]  Chief Complaint  Patient presents with   Edema    HPI: Patient is in today for bilateral lower extremity lymphedema. She is followed by Dr Scot Dock, vascular specialist. She was due to be fitted for compression stockings, however she sustained an injury to left lower leg. Per EMR review, it was determined to allow left lower extremity to heal prior to fitting for compression stockings or lymphedema pump. Her left lower leg wound has now healed. Reports she has not contacted vascular office. She has stage 4 CKD. Currently prescribed Torsemide 20 mg QD. She tells me she has been elevating her legs intermittently. Reports she cares for her spouse that has numerous chronic medical conditions and is unable to elevate legs for long periods.  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 81 yo   BACK SURGERY  2016   lower L 4 to L 5   bladder tack and uterus lift  10/10/2015   IR RADIOLOGIST EVAL & MGMT  12/03/2018   IR RADIOLOGIST EVAL & MGMT  12/31/2018   PARATHYROIDECTOMY Left 07/02/2016   Procedure: LEFT INFERIOR PARATHYROIDECTOMY;  Surgeon: Armandina Gemma, MD;  Location: [facility];  Service: General;  Laterality: Left;   SHOULDER OPEN ROTATOR CUFF REPAIR  12/2019   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    Family History  Problem Relation Age of Onset   Emphysema Mother    Hyperlipidemia Sister    Hypertension Sister    COPD Sister    Hyperlipidemia Sister    Hypertension Sister    Cancer Sister        lymphoma    Obesity Brother    Hyperlipidemia Brother    Hypertension Brother     Social History   Socioeconomic History   Marital status: Married    Spouse name: Not on file   Number of children: Not on file   Years of education: Not on file   Highest education level: Not on file  Occupational History   Not on file  Tobacco Use   Smoking status: Former    Packs/day: 1.00    Years: 50.00    Total pack years: 50.00    Types: Cigarettes    Quit date: 06/11/2005    Years since quitting: 16.5   Smokeless tobacco: Never  Vaping Use   Vaping Use: Never used  Substance and Sexual Activity   Alcohol use: No   Drug use: No   Sexual activity: Yes    Partners: Male  Other Topics Concern   Not on file  Social History Narrative   Not on file   Social Determinants of Health   Financial Resource Strain: Low Risk  (10/24/2021)   Overall Financial Resource Strain (CARDIA)    Difficulty of Paying Living Expenses: Not hard at all  Recent Concern: Financial Resource Strain - High Risk (09/19/2021)   Overall Financial Resource Strain (CARDIA)    Difficulty of Paying Living Expenses: Hard  Food Insecurity: No Food Insecurity (10/24/2021)   Hunger Vital Sign    Worried About Running Out of Food in the Last Year: Never true    Ran Out of Food in the Last Year: Never true  Transportation Needs: No Transportation Needs (09/19/2021)   PRAPARE - Hydrologist (Medical): No    Lack of Transportation (Non-Medical): No  Physical Activity: Inactive (10/24/2021)   Exercise Vital Sign    Days of Exercise per Week: 0 days    Minutes of Exercise per Session: 0 min  Stress: No Stress Concern Present (10/24/2021)   [place]    Feeling of Stress : Not at all  Social Connections: Moderately Integrated (10/24/2021)   Social Connection and Isolation Panel [NHANES]    Frequency of Communication with Friends and Family:  More than three times a week    Frequency of Social Gatherings with Friends and Family: More than three times a week    Attends Religious Services: More than 4 times per year    Active Member of [employer] or Organizations: No    Attends Archivist Meetings: Never    Marital Status: Married  Human resources officer Violence: Not At Risk (10/24/2021)   Humiliation, Afraid, Rape, and Kick questionnaire    Fear of Current or Ex-Partner: No    Emotionally Abused: No    Physically Abused: No    Sexually Abused: No    Outpatient Medications Prior to Visit  Medication Sig Dispense Refill   atorvastatin (LIPITOR) 40 MG tablet Take 1 tablet (40 mg total) by mouth daily. Per ccm 90 tablet 3   Budeson-Glycopyrrol-Formoterol (BREZTRI AEROSPHERE) 160-9-4.8 MCG/ACT AERO Inhale 2 puffs into the lungs in the morning and at bedtime. 10.7 g 6   esomeprazole (NEXIUM) 40 MG capsule Take 40 mg by mouth daily as needed.      metoprolol succinate (TOPROL-XL) 25 MG 24 hr tablet Take 1 tablet (25 mg total) by mouth daily. 90 tablet 3   rOPINIRole (REQUIP) 2 MG tablet TAKE 1 TABLET(2 MG) BY MOUTH IN THE MORNING AND AT BEDTIME 180 tablet 6   torsemide (DEMADEX) 20 MG tablet Take 1 tablet (20 mg total) by mouth daily. (Patient not taking: Reported on 11/23/2021) 30 tablet 3   traMADol-acetaminophen (ULTRACET) 37.5-325 MG tablet Take 1 tablet by mouth every 8 (eight) hours as needed. 15 tablet 0   VENTOLIN HFA 108 (90 Base) MCG/ACT inhaler SMARTSIG:1-2 Puff(s) By Mouth Every 4 Hours PRN     No facility-administered medications prior to visit.    Allergies  Allergen Reactions   Contrast Media [Iodinated Contrast Media] Hives, Itching and Rash   Shellfish Allergy Nausea And Vomiting   Shellfish-Derived Products Nausea And Vomiting    Review of Systems  Respiratory:  Negative for cough and shortness of breath.   Cardiovascular:  Positive for leg swelling (chronic, bilateral).      Objective:    Physical  Exam Vitals reviewed.  Cardiovascular:     Rate and Rhythm: Normal rate and regular rhythm.     Pulses: Normal pulses.  Pulmonary:     Effort: Pulmonary effort is normal.     Breath sounds: Normal breath sounds.  Musculoskeletal:        General: Swelling present.     Right lower leg: Edema present.     Left lower leg: Edema present.  Skin:    Capillary Refill: Capillary refill takes less than  2 seconds.  Neurological:     General: No focal deficit present.     Mental Status: She is alert and oriented to person, place, and time.     BP 134/68   Pulse 96   Temp (!) 97.1 F (36.2 C)   Ht _0  (1.6 m)   Wt 166 lb (75.3 kg)   SpO2 96%   BMI 29.41 kg/m   Wt Readings from Last 3 Encounters:  12/26/21 166 lb (75.3 kg)  11/23/21 160 lb (72.6 kg)  11/08/21 164 lb (74.4 kg)    Health Maintenance Due  Topic Date Due   Pneumonia Vaccine 27+ Years old (2 - PPSV23 or PCV20) 05/22/2017   Zoster Vaccines- Shingrix (2 of 2) 10/12/2021   Lab Results  Component Value Date   WBC 9.6 10/24/2021   HGB 11.6 10/24/2021   HCT 35.9 10/24/2021   MCV 94 10/24/2021   PLT 402 10/24/2021   Lab Results  Component Value Date   NA 141 11/23/2021   K 5.6 (H) 11/23/2021   CO2 20 11/23/2021   GLUCOSE 99 11/23/2021   BUN 29 (H) 11/23/2021   CREATININE 1.95 (H) 11/23/2021   BILITOT 0.3 11/23/2021   ALKPHOS 105 11/23/2021   AST 19 11/23/2021   ALT 14 11/23/2021   PROT 5.9 (L) 11/23/2021   ALBUMIN 3.9 11/23/2021   CALCIUM 9.4 11/23/2021   ANIONGAP 5 06/26/2016   EGFR 26 (L) 11/23/2021   Lab Results  Component Value Date   CHOL 133 10/24/2021   Lab Results  Component Value Date   HDL 55 10/24/2021   Lab Results  Component Value Date   LDLCALC 63 10/24/2021   Lab Results  Component Value Date   TRIG 75 10/24/2021   Lab Results  Component Value Date   CHOLHDL 2.4 10/24/2021   Lab Results  Component Value Date   HGBA1C 5.6 04/04/2021       Assessment & Plan:    1.  Lymphedema - Unna boot - Apply unna boot  2. Chronic venous insufficiency of lower extremity  -follow up with vascular specialist as scheduled Contact Dr Scot Dock concerning lymphedema to lower legs Look for Cere Ve or Cetaphil lotion for dry skin    Follow-up: PRN  An After Visit Summary was printed and given to the patient.  I, Rip Harbour, NP, have reviewed all documentation for this visit. The documentation on 12/26/21 for the exam, diagnosis, procedures, and orders are all accurate and complete.   Signed, Rip Harbour, NP [place] [phone]

## 2021-12-26 NOTE — Patient Instructions (Addendum)
Contact Dr Scot Dock concerning lymphedema to lower legs Look for Cere Ve or Cetaphil lotion for dry skin  Unna Boot Care An [employer] is a type of bandage (dressing) for the foot and leg. The dressing is a gauze wrap that is soaked with a type of medicine called zinc oxide. The gauze may also include other lotions and medicines that help in wound healing, such as calamine. An Unna boot may be used to treat: Open sores (ulcers) on the foot, heel, or leg. Swelling from disorders that affect the veins or lymphatic system (lymphedema). Skin conditions such as chronic inflammation caused by poor blood flow (stasis dermatitis). The dressing is applied by a health care provider. The gauze is wrapped around your lower extremity in several layers, usually starting at the toes and going upward to the knee. A dry outer wrap goes over the medicated wrap for support and compression.  Before applying the [employer], your health care provider will clean your leg and foot and may apply an antibiotic ointment. You may be asked to raise (elevate) your leg for a while to reduce swelling before the boot is applied. The boot will dry and harden after it is applied. The boot may need to be changed or replaced about twice a week. Follow these instructions at home: [place] as told by your health care provider. You may need to wear a slipper or shoe over the boot that is one or two sizes larger than normal. Check the skin around the boot every day. Tell your health care provider about any concerns. Do not stick anything inside the boot to scratch your skin. Doing that increases your risk of infection. Keep your [employer] clean and dry. Check every day for signs of infection. Check for: Redness, swelling, or pain in your foot or toes. Fluid or blood coming from the boot. Pus or a bad smell coming from the boot. Remove the boot and call your health care provider if you have signs of poor blood flow,  such as: Your toes tingle or become numb. Your toes turn cold or turn blue or pale. Your toes are more swollen or painful. You are unable to move your toes. Activity You may walk with the boot once it has dried. Ask your health care provider how much walking is safe for you. Avoid sitting for a long time without moving. Get up to take short walks as told by your health care provider. This is important to improve blood flow. Bathing Do not take baths, swim, or use a hot tub until your health care provider approves. Ask your health care provider if you may take showers. If your health care provider approves a bath or a shower, do not let the Unna boot get wet. If you take a shower, cover the boot with a watertight covering. If you take a bath, keep your leg with the boot out of the tub. General instructions Keep your leg elevated above the level of your heart while you are sitting or lying down. This will decrease swelling. Do not sit with your knee bent for long periods of time. Take over-the-counter and prescription medicines only as told by your health care provider. Do not use any products that contain nicotine or tobacco, such as cigarettes, e-cigarettes, and chewing tobacco. These can delay healing. If you need help quitting, ask your health care provider. Keep all follow-up visits as told by your health care provider. This is important.  Contact a health care provider if: Your skin feels itchy inside the boot. You have a burning sensation, a rash, or itchy, red, swollen areas of skin (hives) in the boot area. You have a fever or chills. You have any signs of infection, such as: New redness, swelling, or pain. More fluid or blood coming from the boot. Pus or a bad smell coming from the boot. You have increased numbness or pain in your foot or toes. You have any changes in skin color on your foot or toes, such as the skin turning blue or pale or developing patchy areas with spots. Your  boot has been damaged or feels like it is no longer fitting properly. Summary An Louretta [place] boot is a type of bandage (dressing) system for the foot and leg. The dressing is a gauze wrap that is soaked with a type of medicine (zinc oxide) to treat foot, heel, or leg ulcers, swelling from disorders that affect the veins or lymphatic system (lymphedema), and skin conditions caused by poor blood flow (stasis dermatitis). This dressing is applied by a health care provider. After it is applied, the boot will dry and harden. The boot may need to be changed or replaced about twice a week. Let your health care provider know if you have any signs of poor blood flow or infection. This information is not intended to replace advice given to you by your health care provider. Make sure you discuss any questions you have with your health care provider. Document Revised: 03/23/2021 Document Reviewed: 03/23/2021 Elsevier Patient Education  [place].   Lymphedema  Lymphedema is swelling that is caused by the abnormal collection of lymph in the tissues under the skin. Lymph is excess fluid from the tissues in your body that is removed through the lymphatic system. This system is part of your body's defense system (immune system) and includes lymph nodes and lymph vessels. The lymph vessels collect and carry the excess fluid, fats, proteins, and waste from the tissues of the body to the bloodstream. This system also works to clean and remove bacteria and waste products from the body. Lymphedema occurs when the lymphatic system is blocked. When the lymph vessels or lymph nodes are blocked or damaged, lymph does not drain properly. This causes an abnormal buildup of lymph, which leads to swelling in the affected area. This may include the trunk area, or an arm or leg. Lymphedema cannot be cured by medicines, but various methods can be used to help reduce the swelling. What are the causes? The cause of this condition  depends on the type of lymphedema that you have. Primary lymphedema is caused by the absence of lymph vessels or having abnormal lymph vessels at birth. Secondary lymphedema occurs when lymph vessels are blocked or damaged. Secondary lymphedema is more common. Common causes of lymph vessel blockage include: Skin infection, such as cellulitis. Infection by parasites (filariasis). Injury. Radiation therapy. Cancer. Formation of scar tissue. Surgery. What are the signs or symptoms? Symptoms of this condition include: Swelling of the arm or leg. A heavy or tight feeling in the arm or leg. Swelling of the feet, toes, or fingers. Shoes or rings may fit more tightly than before. Redness of the skin over the affected area. Limited movement of the affected limb. Sensitivity to touch or discomfort in the affected limb. How is this diagnosed? This condition may be diagnosed based on: Your symptoms and medical history. A physical exam. Bioimpedance spectroscopy. In this test, painless electrical  currents are used to measure fluid levels in your body. Imaging tests, such as: MRI. CT scan. Duplex ultrasound. This test uses sound waves to produce images of the vessels and the blood flow on a screen. Lymphoscintigraphy. In this test, a low dose of a radioactive substance is injected to trace the flow of lymph through your lymph vessels. Lymphangiography. In this test, a contrast dye is injected into the lymph vessel to help show blockages. How is this treated?  If an underlying condition is causing the lymphedema, that condition will be treated. For example, antibiotic medicines may be used to treat an infection. Treatment for this condition will depend on the cause of your lymphedema. Treatment may include: Complete decongestive therapy (CDT). This is done by a certified lymphedema therapist to reduce fluid congestion. This therapy includes: Skin care. Compression wrapping of the affected  area. Manual lymph drainage. This is a special massage technique that promotes lymph drainage out of a limb. Specific exercises. Certain exercises can help fluid move out of the affected limb. Compression. Various methods may be used to apply pressure to the affected limb to reduce the swelling. They include: Wearing compression stockings or sleeves on the affected limb. Wrapping the affected limb with special bandages. Surgery. This is usually done for severe cases only. For example, surgery may be done if you have trouble moving the limb or if the swelling does not get better with other treatments. Follow these instructions at home: Self-care The affected area is more likely to become injured or infected. Take these steps to help prevent infection: Keep the affected area clean and dry. Use approved creams or lotions to keep the skin moisturized. Protect your skin from cuts: Use gloves while cooking or gardening. Do not walk barefoot. If you shave the affected area, use an Copy. Do not wear tight clothes, shoes, or jewelry. Eat a healthy diet that includes a lot of fruits and vegetables. Activity Do exercises as told by your health care provider. Do not sit with your legs crossed. When possible, keep the affected limb raised (elevated) above the level of your heart. Avoid carrying things with an arm that is affected by lymphedema. General instructions Wear compression stockings or sleeves as told by your health care provider. Note any changes in size of the affected limb. You may be instructed to take regular measurements and keep track of them. Take over-the-counter and prescription medicines only as told by your health care provider. If you were prescribed an antibiotic medicine, take or apply it as told by your health care provider. Do not stop using the antibiotic even if you start to feel better or if your condition improves. Do not use heating pads or ice packs on the  affected area. Avoid having blood draws, IV insertions, or blood pressure checks on the affected limb. Keep all follow-up visits. This is important. Contact a health care provider if you: Continue to have swelling in your limb. Have fluid leaking from the skin of your swollen limb. Have a cut that does not heal. Have redness or pain in the affected area. Develop purplish spots, rash, blisters, or sores (lesions) on your affected limb. Get help right away if you: Have new swelling in your limb that starts suddenly. Have shortness of breath or chest pain. Have a fever or chills. These symptoms may represent a serious problem that is an emergency. Do not wait to see if the symptoms will go away. Get medical help right away. Call  your local emergency services (911 in the [place] Do not drive yourself to the hospital. Summary Lymphedema is swelling that is caused by the abnormal collection of lymph in the tissues under the skin. Lymph is fluid from the tissues in your body that is removed through the lymphatic system. This system collects and carries excess fluid, fats, proteins, and wastes from the tissues of the body to the bloodstream. Lymphedema causes swelling, pain, and redness in the affected area. This may include the trunk area, or an arm or leg. Treatment for this condition may depend on the cause of your lymphedema. Treatment may include treating the underlying cause, complete decongestive therapy (CDT), compression methods, or surgery. This information is not intended to replace advice given to you by your health care provider. Make sure you discuss any questions you have with your health care provider. Document Revised: 03/23/2020 Document Reviewed: 03/23/2020 Elsevier Patient Education  [place].

## 2021-12-29 DIAGNOSIS — M7989 Other specified soft tissue disorders: Secondary | ICD-10-CM

## 2022-01-19 ENCOUNTER — Telehealth: Payer: Self-pay

## 2022-01-19 NOTE — Progress Notes (Signed)
Care Gap(s) Not Met that Need to be Addressed:  Controlling High Blood Pressure  Action Taken: Update care gap list with latest BP 12/26/21 134/68   Follow Up: 02/27/22 with Jerrell Belfast

## 2022-01-30 DIAGNOSIS — K5989 Other specified functional intestinal disorders: Secondary | ICD-10-CM | POA: Diagnosis not present

## 2022-01-31 ENCOUNTER — Ambulatory Visit: Payer: Medicare Other | Admitting: Vascular Surgery

## 2022-02-01 ENCOUNTER — Telehealth: Payer: Self-pay | Admitting: Family Medicine

## 2022-02-01 NOTE — Telephone Encounter (Signed)
Called schedule AWV on telephone visit  For  02/15/2022 at 1 pm

## 2022-02-05 DIAGNOSIS — C672 Malignant neoplasm of lateral wall of bladder: Secondary | ICD-10-CM | POA: Diagnosis not present

## 2022-02-05 DIAGNOSIS — N2889 Other specified disorders of kidney and ureter: Secondary | ICD-10-CM | POA: Diagnosis not present

## 2022-02-14 ENCOUNTER — Encounter: Payer: Self-pay | Admitting: Vascular Surgery

## 2022-02-14 ENCOUNTER — Ambulatory Visit (INDEPENDENT_AMBULATORY_CARE_PROVIDER_SITE_OTHER): Payer: Medicare Other | Admitting: Vascular Surgery

## 2022-02-14 VITALS — BP 182/100 | HR 90 | Temp 98.0°F | Resp 16 | Ht 63.0 in | Wt 159.0 lb

## 2022-02-14 DIAGNOSIS — I872 Venous insufficiency (chronic) (peripheral): Secondary | ICD-10-CM

## 2022-02-14 DIAGNOSIS — I890 Lymphedema, not elsewhere classified: Secondary | ICD-10-CM

## 2022-02-14 DIAGNOSIS — I70219 Atherosclerosis of native arteries of extremities with intermittent claudication, unspecified extremity: Secondary | ICD-10-CM | POA: Diagnosis not present

## 2022-02-14 NOTE — Progress Notes (Addendum)
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  She also has some underlying chronic venous insufficiency with some deep venous reflux noted on noninvasive studies.  She continues to use compression therapy, exercise,  and elevate her legs as previously prescribed.  Given her persistent problems with swelling now for over a year, I think she would be an excellent candidate for a BioTAB half leg device that she could use in conjunction with proper leg elevation to maximize venous flow and reduce swelling.  She lives quite [place] ways away so I will plan on seeing her back in a year unless she call sooner if her swelling is worsening.  HPI:   Alexandra [name] is a pleasant 81 y.o. female who I last saw on 11/08/2021 with combined lymphedema and chronic venous insufficiency.  She had been elevating her legs and using compression therapy.  I was also considering a BioTAB lymphedema pump.  However she had injured her leg from a fall and so we are having to hold off on this.   She comes in for a follow-up visit.  She continues to have significant swelling in both lower extremities which she has had for over a year.  She has idiopathic lymphedema and also some underlying chronic venous insufficiency as documented on her previous noninvasive studies.  She has been using compression therapy which helps some.  She is also been elevating her legs which helps.  In addition to swelling she has had lymphorrhea.  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   Hypertension    Osteoporosis    Restless legs syndrome    RLS (restless legs syndrome)     Family History   Problem Relation Age of Onset   Emphysema Mother    Hyperlipidemia Sister    Hypertension Sister    COPD Sister    Hyperlipidemia Sister    Hypertension Sister    Cancer Sister        lymphoma   Obesity Brother    Hyperlipidemia Brother    Hypertension Brother     SOCIAL HISTORY: Social History   Tobacco Use   Smoking status: Former    Packs/day: 1.00    Years: 50.00    Total pack years: 50.00    Types: Cigarettes    Quit date: 06/11/2005    Years since quitting: 16.6   Smokeless tobacco: Never  Substance Use Topics   Alcohol use: No    Allergies  Allergen Reactions   Contrast Media [Iodinated Contrast Media] Hives, Itching and Rash   Shellfish Allergy Nausea And Vomiting   Shellfish-Derived Products Nausea And Vomiting    Current Outpatient Medications  Medication Sig Dispense Refill   atorvastatin (LIPITOR) 40 MG tablet Take 1 tablet (40 mg total) by mouth daily. Per ccm 90 tablet 3   Budeson-Glycopyrrol-Formoterol (BREZTRI AEROSPHERE) 160-9-4.8 MCG/ACT AERO Inhale 2 puffs into the lungs in the morning and at bedtime. 10.7 g 6   esomeprazole (NEXIUM) 40 MG capsule Take 40 mg by mouth daily as needed.      metoprolol succinate (TOPROL-XL) 25 MG 24 hr tablet Take 1 tablet (25 mg total) by mouth daily. 90 tablet 3  rOPINIRole (REQUIP) 2 MG tablet TAKE 1 TABLET(2 MG) BY MOUTH IN THE MORNING AND AT BEDTIME 180 tablet 6   traMADol-acetaminophen (ULTRACET) 37.5-325 MG tablet Take 1 tablet by mouth every 8 (eight) hours as needed. 15 tablet 0   VENTOLIN HFA 108 (90 Base) MCG/ACT inhaler SMARTSIG:1-2 Puff(s) By Mouth Every 4 Hours PRN     torsemide (DEMADEX) 20 MG tablet Take 1 tablet (20 mg total) by mouth daily. (Patient not taking: Reported on 11/23/2021) 30 tablet 3   No current facility-administered medications for this visit.    REVIEW OF SYSTEMS:  '[X]'$  denotes positive finding, '[ ]'$  denotes negative finding Cardiac  Comments:  Chest pain or chest pressure:     Shortness of breath upon exertion:    Short of breath when lying flat:    Irregular heart rhythm:        Vascular    Pain in calf, thigh, or hip brought on by ambulation: x   Pain in feet at night that wakes you up from your sleep:  x   Blood clot in your veins:    Leg swelling:         Pulmonary    Oxygen at home:    Productive cough:     Wheezing:         Neurologic    Sudden weakness in arms or legs:  x   Sudden numbness in arms or legs:     Sudden onset of difficulty speaking or slurred speech:    Temporary loss of vision in one eye:     Problems with dizziness:         Gastrointestinal    Blood in stool:     Vomited blood:         Genitourinary    Burning when urinating:     Blood in urine:        Psychiatric    Major depression:         Hematologic    Bleeding problems:    Problems with blood clotting too easily:        Skin    Rashes or ulcers:        Constitutional    Fever or chills:     PHYSICAL EXAM:   Vitals:   02/14/22 1408 02/14/22 1420  BP: (!) 194/95 (!) 182/100  Pulse: 90   Resp: 16   Temp: 98 F (36.7 C)   TempSrc: Temporal   SpO2: 96%   Weight: 159 lb (72.1 kg)   Height: '5\' 3"'$  (1.6 m)     GENERAL: The patient is a well-nourished female, in no acute distress. The vital signs are documented above. CARDIAC: There is a regular rate and rhythm.  VASCULAR: She has palpable dorsalis pedis pulses. She has significant bilateral lower extremity swelling with hyperkeratosis bilaterally.     PULMONARY: There is good air exchange bilaterally without wheezing or rales. ABDOMEN: Soft and non-tender with normal pitched bowel sounds.  MUSCULOSKELETAL: There are no major deformities or cyanosis. NEUROLOGIC: No focal weakness or paresthesias are detected. SKIN: There are no ulcers or rashes noted. PSYCHIATRIC: The patient has a normal affect.  DATA:    No new data.  Deitra Mayo [facility]  [phone]

## 2022-02-15 ENCOUNTER — Encounter: Payer: Self-pay | Admitting: Family Medicine

## 2022-02-15 ENCOUNTER — Ambulatory Visit: Payer: Medicare Other | Admitting: Family Medicine

## 2022-02-15 VITALS — BP 126/78 | Ht 63.0 in | Wt 159.0 lb

## 2022-02-15 DIAGNOSIS — N134 Hydroureter: Secondary | ICD-10-CM | POA: Diagnosis not present

## 2022-02-15 DIAGNOSIS — Z0000 Encounter for general adult medical examination without abnormal findings: Secondary | ICD-10-CM | POA: Diagnosis not present

## 2022-02-15 DIAGNOSIS — K5730 Diverticulosis of large intestine without perforation or abscess without bleeding: Secondary | ICD-10-CM | POA: Diagnosis not present

## 2022-02-15 DIAGNOSIS — N1330 Unspecified hydronephrosis: Secondary | ICD-10-CM | POA: Diagnosis not present

## 2022-02-15 DIAGNOSIS — N2889 Other specified disorders of kidney and ureter: Secondary | ICD-10-CM | POA: Diagnosis not present

## 2022-02-15 DIAGNOSIS — C649 Malignant neoplasm of unspecified kidney, except renal pelvis: Secondary | ICD-10-CM | POA: Diagnosis not present

## 2022-02-15 NOTE — Progress Notes (Signed)
I connected with  Alexandra [name] on 02/15/22 by a audio enabled telemedicine application and verified that I am speaking with the correct person using two identifiers.  Patient Location: Home  Provider Location: Home Office  I discussed the limitations of evaluation and management by telemedicine. The patient expressed understanding and agreed to proceed.   Subjective:   Alexandra [name] is a 81 y.o. female who presents for Medicare Annual (Subsequent) preventive examination.  Review of Systems     Cardiac Risk Factors include: advanced age (>41mn, >>65women);dyslipidemia;hypertension;sedentary lifestyle;obesity (BMI >30kg/m2);smoking/ tobacco exposure     Objective:    Today's Vitals   02/15/22 1328  BP: 126/78  Weight: 159 lb (72.1 kg)  Height: '5\' 3"'$  (1.6 m)   Body mass index is 28.17 kg/m.     02/15/2022    1:32 PM 07/02/2016    7:14 AM 06/26/2016    2:28 PM  Advanced Directives  Does Patient Have a Medical Advance Directive? No No No  Would patient like information on creating a medical advance directive? No - Patient declined No - Patient declined No - Patient declined    Current Medications (verified) Outpatient Encounter Medications as of 02/15/2022  Medication Sig   atorvastatin (LIPITOR) 40 MG tablet Take 1 tablet (40 mg total) by mouth daily. Per ccm   Budeson-Glycopyrrol-Formoterol (BREZTRI AEROSPHERE) 160-9-4.8 MCG/ACT AERO Inhale 2 puffs into the lungs in the morning and at bedtime.   esomeprazole (NEXIUM) 40 MG capsule Take 40 mg by mouth daily as needed.    metoprolol succinate (TOPROL-XL) 25 MG 24 hr tablet Take 1 tablet (25 mg total) by mouth daily.   rOPINIRole (REQUIP) 2 MG tablet TAKE 1 TABLET(2 MG) BY MOUTH IN THE MORNING AND AT BEDTIME   torsemide (DEMADEX) 20 MG tablet Take 1 tablet (20 mg total) by mouth daily. (Patient not taking: Reported on 11/23/2021)   traMADol-acetaminophen (ULTRACET) 37.5-325 MG tablet Take 1 tablet by mouth every 8 (eight)  hours as needed.   VENTOLIN HFA 108 (90 Base) MCG/ACT inhaler SMARTSIG:1-2 Puff(s) By Mouth Every 4 Hours PRN   No facility-administered encounter medications on file as of 02/15/2022.    Allergies (verified) Contrast media [iodinated contrast media], Shellfish allergy, and Shellfish-derived products   History: Past Medical History:  Diagnosis Date   COPD (chronic obstructive pulmonary disease) (HCC)    GERD (gastroesophageal reflux disease)    Hyperlipidemia    Hyperparathyroidism, primary ([place] 06/25/2016   Hypertension    Osteoporosis    Restless legs syndrome    RLS (restless legs syndrome)    Past Surgical History:  Procedure Laterality Date   ABDOMINAL HYSTERECTOMY     complete: menorrhagia. 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   Family History  Problem Relation Age of Onset   Emphysema Mother    Hyperlipidemia Sister    Hypertension Sister    COPD Sister    Hyperlipidemia Sister    Hypertension Sister    Cancer Sister  lymphoma   Obesity Brother    Hyperlipidemia Brother    Hypertension Brother    Social History   Socioeconomic History   Marital status: Married    Spouse name: Not on file   Number of children: Not on file   Years of education: Not on file   Highest education level: Not on file  Occupational History   Not on file  Tobacco Use   Smoking status: Former    Packs/day: 1.00    Years: 50.00    Total pack years: 50.00    Types: Cigarettes    Quit date: 06/11/2005    Years since quitting: 16.6   Smokeless tobacco: Never  Vaping Use   Vaping Use:  Never used  Substance and Sexual Activity   Alcohol use: No   Drug use: No   Sexual activity: Yes    Partners: Male  Other Topics Concern   Not on file  Social History Narrative   Not on file   Social Determinants of Health   Financial Resource Strain: Low Risk  (02/15/2022)   Overall Financial Resource Strain (CARDIA)    Difficulty of Paying Living Expenses: Not hard at all  Food Insecurity: No Food Insecurity (02/15/2022)   Hunger Vital Sign    Worried About Running Out of Food in the Last Year: Never true    [place] in the Last Year: Never true  Transportation Needs: No Transportation Needs (02/15/2022)   PRAPARE - Hydrologist (Medical): No    Lack of Transportation (Non-Medical): No  Physical Activity: Inactive (02/15/2022)   Exercise Vital Sign    Days of Exercise per Week: 0 days    Minutes of Exercise per Session: 0 min  Stress: No Stress Concern Present (02/15/2022)   [place]    Feeling of Stress : Only a little  Social Connections: Moderately Integrated (02/15/2022)   Social Connection and Isolation Panel [NHANES]    Frequency of Communication with Friends and Family: More than three times a week    Frequency of Social Gatherings with Friends and Family: More than three times a week    Attends Religious Services: More than 4 times per year    Active Member of [employer] or Organizations: No    Attends Music therapist: Never    Marital Status: Married    Tobacco Counseling Counseling given: Not Answered   Clinical Intake:  Pre-visit preparation completed: No  Pain : No/denies pain     Nutritional Status: BMI 25 -29 Overweight Diabetes: No  How often do you need to have someone help you when you read instructions, pamphlets, or other written materials from your doctor or pharmacy?: 1 - Never  Diabetic? no  Interpreter Needed?: No       Activities of Daily Living    02/15/2022    1:32 PM 12/26/2021    8:47 AM  In your present state of health, do you have any difficulty performing the following activities:  Hearing? 0 0  Vision? 1 0  Difficulty concentrating or making decisions? 0 0  Walking or climbing stairs? 0 0  Dressing or bathing? 0 0  Doing errands, shopping? 0 0  Preparing Food and eating ? N   Using the Toilet? N   In the past six months, have you accidently leaked urine? N   Do you have problems with loss of bowel control? N  Managing your Medications? N   Managing your Finances? N   Housekeeping or managing your Housekeeping? N     Patient Care Team: Rip Harbour, NP as PCP - General (Nurse Practitioner) Lane Hacker, [facility] (Pharmacist)  Indicate any recent Medical Services you may have received from other than [facility] providers in the past year (date may be approximate).     Assessment:   This is a routine wellness examination for [place].  Hearing/Vision screen No results found.  Dietary issues and exercise activities discussed: Current Exercise Habits: The patient does not participate in regular exercise at present, Exercise limited by: orthopedic condition(s);cardiac condition(s)   Goals Addressed   None   Depression Screen    02/15/2022    1:31 PM 12/26/2021    8:46 AM 11/23/2021    7:31 AM 08/17/2021    7:48 AM 04/04/2021    7:35 AM 03/07/2021   10:56 AM 09/26/2020    7:38 AM  PHQ 2/9 Scores  PHQ - 2 Score 0 0 0 0 0 0 0  PHQ- 9 Score  0         Fall Risk    02/15/2022    1:32 PM 12/26/2021    8:46 AM 11/23/2021    7:31 AM 08/17/2021    7:48 AM 04/04/2021    7:34 AM  Fall Risk   Falls in the past year? '1 1 1 '$ 0 1  Number falls in past yr: 1 0 1 0 0  Injury with Fall? '1 1 1 '$ 0 1  Risk for fall due to : History of fall(s);Impaired balance/gait;Impaired mobility History of fall(s) History of fall(s)    Follow up Falls evaluation completed;Education provided;Falls prevention  discussed Falls evaluation completed Falls evaluation completed  Falls evaluation completed    FALL RISK PREVENTION PERTAINING TO THE HOME:  Any stairs in or around the home? Yes  If so, are there any without handrails? Yes  Home free of loose throw rugs in walkways, pet beds, electrical cords, etc? Yes  Adequate lighting in your home to reduce risk of falls? Yes   ASSISTIVE DEVICES UTILIZED TO PREVENT FALLS:  Life alert? No  Use of a cane, walker or w/c? Yes  Grab bars in the bathroom? Yes  Shower chair or bench in shower? Yes  Elevated toilet seat or a handicapped toilet? Yes    Cognitive Function:        02/15/2022    1:33 PM  6CIT Screen  What Year? 0 points  What month? 0 points  What time? 0 points  Count back from 20 0 points  Months in reverse 0 points  Repeat phrase 8 points  Total Score 8 points    Immunizations Immunization History  Administered Date(s) Administered   Fluad Quad(high Dose 65+) 03/25/2019, 02/17/2020, 04/04/2021   Influenza, High Dose Seasonal PF 03/27/2017, 04/01/2018   Moderna SARS-COV2 Booster Vaccination 04/12/2020, 12/01/2020   Moderna Sars-Covid-2 Vaccination 06/26/2019, 07/24/2019   Pneumococcal Conjugate-13 10/15/2013, 03/27/2017   Pneumococcal-Unspecified 10/15/2013   Tdap 08/17/2021   Zoster Recombinat (Shingrix) 08/17/2021   Zoster, Live 11/23/2010    TDAP status: Up to date  Flu Vaccine status: Due, Education has been provided regarding the importance of this vaccine. Advised may receive this vaccine at local pharmacy or Health Dept. Aware to provide a copy of the vaccination record if obtained from local pharmacy or Health Dept. Verbalized acceptance and understanding.  Pneumococcal vaccine status: Up to date  Covid-19 vaccine status: Completed vaccines  Qualifies for Shingles Vaccine? Yes   Zostavax completed Yes   Shingrix Completed?: Yes  Screening Tests Health Maintenance  Topic Date Due   Pneumonia Vaccine 95+  Years old (2 - PPSV23 or PCV20) 05/22/2017   INFLUENZA VACCINE  01/09/2022   Hepatitis C Screening  04/04/2022 (Originally 01/22/1959)   Zoster Vaccines- Shingrix (2 of 2) 05/17/2022 (Originally 10/12/2021)   TETANUS/TDAP  08/18/2031   DEXA SCAN  Completed   HPV VACCINES  Aged Out   COVID-19 Vaccine  Discontinued    Health Maintenance  Health Maintenance Due  Topic Date Due   Pneumonia Vaccine 50+ Years old (2 - PPSV23 or PCV20) 05/22/2017   INFLUENZA VACCINE  01/09/2022    Colorectal cancer screening: Type of screening: Colonoscopy. Completed  . Repeat every 5-10 years  Mammogram- Declined yearly   Dexa Scan- Twice, needs repeat   Lung Cancer Screening: (Low Dose CT Chest recommended if Age 82-80 years, 30 pack-year currently smoking OR have quit w/in 15years.) does not qualify.     Additional Screening:  Hepatitis C Screening: does qualify; Postponed   Vision Screening: Recommended annual ophthalmology exams for early detection of glaucoma and other disorders of the eye. Is the patient up to date with their annual eye exam?  Yes  Who is the provider or what is the name of the office in which the patient attends annual eye exams? [facility]   Dental Screening: Recommended annual dental exams for proper oral hygiene  Community Resource Referral / Chronic Care Management: CRR required this visit?  No   CCM required this visit?  No      Plan:     I have personally reviewed and noted the following in the patient's chart:   Medical and social history Use of alcohol, tobacco or illicit drugs  Current medications and supplements including opioid prescriptions. Patient is not currently taking opioid prescriptions. Functional ability and status Nutritional status Physical activity Advanced directives List of other physicians Hospitalizations, surgeries, and ER visits in previous 12 months Vitals Screenings to include cognitive, depression, and falls Referrals  and appointments  In addition, I have reviewed and discussed with patient certain preventive protocols, quality metrics, and best practice recommendations. A written personalized care plan for preventive services as well as general preventive health recommendations were provided to patient.     Perlie Mayo, NP   02/15/2022

## 2022-02-15 NOTE — Patient Instructions (Signed)
Alexandra [name] , Thank you for taking time to come for your Medicare Wellness Visit. I appreciate your ongoing commitment to your health goals. Please review the following plan we discussed and let me know if I can assist you in the future.   Screening recommendations/referrals: Recommended yearly ophthalmology/optometry visit for glaucoma screening and checkup Recommended yearly dental visit for hygiene and checkup  Vaccinations: Influenza vaccine: Due  Pneumococcal vaccine: Due for last one Tdap vaccine: Up to date Shingles vaccine: Please bring in second shot card so we can update in system  Advanced directives: Please let us help you with this when you are ready  Conditions/risks identified: Falls   Next appointment: As scheduled    Preventive Care 65 Years and Older, Female Preventive care refers to lifestyle choices and visits with your health care provider that can promote health and wellness. What does preventive care include? A yearly physical exam. This is also called an annual well check. Dental exams once or twice a year. Routine eye exams. Ask your health care provider how often you should have your eyes checked. Personal lifestyle choices, including: Daily care of your teeth and gums. Regular physical activity. Eating a healthy diet. Avoiding tobacco and drug use. Limiting alcohol use. Practicing safe sex. Taking low-dose aspirin every day. Taking vitamin and mineral supplements as recommended by your health care provider. What happens during an annual well check? The services and screenings done by your health care provider during your annual well check will depend on your age, overall health, lifestyle risk factors, and family history of disease. Counseling  Your health care provider may ask you questions about your: Alcohol use. Tobacco use. Drug use. Emotional well-being. Home and relationship well-being. Sexual activity. Eating habits. History of  falls. Memory and ability to understand (cognition). Work and work Statistician. Reproductive health. Screening  You may have the following tests or measurements: Height, weight, and BMI. Blood pressure. Lipid and cholesterol levels. These may be checked every 5 years, or more frequently if you are over 51 years old. Skin check. Lung cancer screening. You may have this screening every year starting at age 60 if you have a 30-pack-year history of smoking and currently smoke or have quit within the past 15 years. Fecal occult blood test (FOBT) of the stool. You may have this test every year starting at age 3. Flexible sigmoidoscopy or colonoscopy. You may have a sigmoidoscopy every 5 years or a colonoscopy every 10 years starting at age 53. Hepatitis C blood test. Hepatitis B blood test. Sexually transmitted disease (STD) testing. Diabetes screening. This is done by checking your blood sugar (glucose) after you have not eaten for a while (fasting). You may have this done every 1-3 years. Bone density scan. This is done to screen for osteoporosis. You may have this done starting at age 72. Mammogram. This may be done every 1-2 years. Talk to your health care provider about how often you should have regular mammograms. Talk with your health care provider about your test results, treatment options, and if necessary, the need for more tests. Vaccines  Your health care provider may recommend certain vaccines, such as: Influenza vaccine. This is recommended every year. Tetanus, diphtheria, and acellular pertussis (Tdap, Td) vaccine. You may need a Td booster every 10 years. Zoster vaccine. You may need this after age 110. Pneumococcal 13-valent conjugate (PCV13) vaccine. One dose is recommended after age 18. Pneumococcal polysaccharide (PPSV23) vaccine. One dose is recommended after age 62. Talk to  your health care provider about which screenings and vaccines you need and how often you need  them. This information is not intended to replace advice given to you by your health care provider. Make sure you discuss any questions you have with your health care provider. Document Released: 06/24/2015 Document Revised: 02/15/2016 Document Reviewed: 03/29/2015 Elsevier Interactive Patient Education  2017 [place] Prevention in the Home Falls can cause injuries. They can happen to people of all ages. There are many things you can do to make your home safe and to help prevent falls. What can I do on the outside of my home? Regularly fix the edges of walkways and driveways and fix any cracks. Remove anything that might make you trip as you walk through a door, such as a raised step or threshold. Trim any bushes or trees on the path to your home. Use bright outdoor lighting. Clear any walking paths of anything that might make someone trip, such as rocks or tools. Regularly check to see if handrails are loose or broken. Make sure that both sides of any steps have handrails. Any raised decks and porches should have guardrails on the edges. Have any leaves, snow, or ice cleared regularly. Use sand or salt on walking paths during winter. Clean up any spills in your garage right away. This includes oil or grease spills. What can I do in the bathroom? Use night lights. Install grab bars by the toilet and in the tub and shower. Do not use towel bars as grab bars. Use non-skid mats or decals in the tub or shower. If you need to sit down in the shower, use a plastic, non-slip stool. Keep the floor dry. Clean up any water that spills on the floor as soon as it happens. Remove soap buildup in the tub or shower regularly. Attach bath mats securely with double-sided non-slip rug tape. Do not have throw rugs and other things on the floor that can make you trip. What can I do in the bedroom? Use night lights. Make sure that you have a light by your bed that is easy to reach. Do not use  any sheets or blankets that are too big for your bed. They should not hang down onto the floor. Have a firm chair that has side arms. You can use this for support while you get dressed. Do not have throw rugs and other things on the floor that can make you trip. What can I do in the kitchen? Clean up any spills right away. Avoid walking on wet floors. Keep items that you use a lot in easy-to-reach places. If you need to reach something above you, use a strong step stool that has a grab bar. Keep electrical cords out of the way. Do not use floor polish or wax that makes floors slippery. If you must use wax, use non-skid floor wax. Do not have throw rugs and other things on the floor that can make you trip. What can I do with my stairs? Do not leave any items on the stairs. Make sure that there are handrails on both sides of the stairs and use them. Fix handrails that are broken or loose. Make sure that handrails are as long as the stairways. Check any carpeting to make sure that it is firmly attached to the stairs. Fix any carpet that is loose or worn. Avoid having throw rugs at the top or bottom of the stairs. If you do have throw rugs, attach  them to the floor with carpet tape. Make sure that you have a light switch at the top of the stairs and the bottom of the stairs. If you do not have them, ask someone to add them for you. What else can I do to help prevent falls? Wear shoes that: Do not have high heels. Have rubber bottoms. Are comfortable and fit you well. Are closed at the toe. Do not wear sandals. If you use a stepladder: Make sure that it is fully opened. Do not climb a closed stepladder. Make sure that both sides of the stepladder are locked into place. Ask someone to hold it for you, if possible. Clearly mark and make sure that you can see: Any grab bars or handrails. First and last steps. Where the edge of each step is. Use tools that help you move around (mobility aids)  if they are needed. These include: Canes. Walkers. Scooters. Crutches. Turn on the lights when you go into a dark area. Replace any light bulbs as soon as they burn out. Set up your furniture so you have a clear path. Avoid moving your furniture around. If any of your floors are uneven, fix them. If there are any pets around you, be aware of where they are. Review your medicines with your doctor. Some medicines can make you feel dizzy. This can increase your chance of falling. Ask your doctor what other things that you can do to help prevent falls. This information is not intended to replace advice given to you by your health care provider. Make sure you discuss any questions you have with your health care provider. Document Released: 03/24/2009 Document Revised: 11/03/2015 Document Reviewed: 07/02/2014 Elsevier Interactive Patient Education  2017 [employer].

## 2022-02-21 ENCOUNTER — Telehealth: Payer: Self-pay

## 2022-02-21 NOTE — Telephone Encounter (Signed)
Message given to me by K. Ruffin to return call to pt - pt was not available, husband advised that she is in need of the compression devices that was discussed at her last visit. I advised that I would forward this information to the staff member Jeani Hawking) who handles these inquires. He verbalized understanding and agreed with this plan.

## 2022-02-27 ENCOUNTER — Encounter: Payer: Self-pay | Admitting: Nurse Practitioner

## 2022-02-27 ENCOUNTER — Ambulatory Visit (INDEPENDENT_AMBULATORY_CARE_PROVIDER_SITE_OTHER): Payer: Medicare Other | Admitting: Nurse Practitioner

## 2022-02-27 VITALS — BP 128/80 | HR 96 | Temp 97.3°F | Ht 63.0 in | Wt 171.0 lb

## 2022-02-27 DIAGNOSIS — I872 Venous insufficiency (chronic) (peripheral): Secondary | ICD-10-CM | POA: Diagnosis not present

## 2022-02-27 DIAGNOSIS — I129 Hypertensive chronic kidney disease with stage 1 through stage 4 chronic kidney disease, or unspecified chronic kidney disease: Secondary | ICD-10-CM | POA: Diagnosis not present

## 2022-02-27 DIAGNOSIS — N184 Chronic kidney disease, stage 4 (severe): Secondary | ICD-10-CM

## 2022-02-27 DIAGNOSIS — K219 Gastro-esophageal reflux disease without esophagitis: Secondary | ICD-10-CM | POA: Diagnosis not present

## 2022-02-27 DIAGNOSIS — M810 Age-related osteoporosis without current pathological fracture: Secondary | ICD-10-CM | POA: Diagnosis not present

## 2022-02-27 DIAGNOSIS — I890 Lymphedema, not elsewhere classified: Secondary | ICD-10-CM

## 2022-02-27 DIAGNOSIS — Z23 Encounter for immunization: Secondary | ICD-10-CM | POA: Diagnosis not present

## 2022-02-27 DIAGNOSIS — J449 Chronic obstructive pulmonary disease, unspecified: Secondary | ICD-10-CM

## 2022-02-27 DIAGNOSIS — R110 Nausea: Secondary | ICD-10-CM

## 2022-02-27 DIAGNOSIS — Z1231 Encounter for screening mammogram for malignant neoplasm of breast: Secondary | ICD-10-CM | POA: Diagnosis not present

## 2022-02-27 MED ORDER — ONDANSETRON HCL 4 MG PO TABS: 4.0000 mg | ORAL_TABLET | Freq: Three times a day (TID) | ORAL | 1 refills | Status: DC | PRN

## 2022-02-27 MED ORDER — OMEPRAZOLE 40 MG PO CPDR: 40.0000 mg | DELAYED_RELEASE_CAPSULE | Freq: Every day | ORAL | 3 refills | Status: DC

## 2022-02-27 NOTE — Progress Notes (Unsigned)
Subjective:  Patient ID: Alexandra [name], female    DOB: 08-04-1940  Age: 81 y.o. MRN: [id number]  Chief Complaint  Patient presents with   Hypertension    HPI  Pt presents for follow-up of HTN, GERD, and bilateral lymphedema. She tells me she has an upcoming bladder surgery with Dr Gerlene Burdock, urologist. Pt has stage 4 CKD, she is followed by Dr Madelon Lips.   Hypertension, follow-up:  She was last seen for hypertension 3 months ago.  BP at that visit was 140/86. Management includes Metoprolol 25 mg QD. She reports excellent compliance with treatment. She is not having side effects.  She is following a Low Sodium diet. She is not exercising. She does not smoke. Use of agents associated with hypertension: none.  Outside blood pressures are not being checked  Pertinent labs: Lab Results  Component Value Date   CHOL 133 10/24/2021   HDL 55 10/24/2021   LDLCALC 63 10/24/2021   TRIG 75 10/24/2021   CHOLHDL 2.4 10/24/2021   Lab Results  Component Value Date   NA 141 11/23/2021   K 5.6 (H) 11/23/2021   CREATININE 1.95 (H) 11/23/2021   EGFR 26 (L) 11/23/2021   GFRNONAA 25 (L) 03/28/2020   GLUCOSE 99 11/23/2021     The 10-year ASCVD risk score (Arnett DK, et al., 2019) is: 31.8%    GERD, Follow up:  The patient was last seen for GERD 3 months ago. Current treatment consist HW:EXHB modification, pt states she is no longer taking Nexium. She reports good compliance with treatment. She IS experiencing heartburn and chronic nausea while cooking She is NOT experiencing belching and eructation or difficulty swallowing   Lymphedema, follow-up: Pt has a history of lymphedema with chronic venous insufficiency. She is followed by Dr Deitra Mayo, vascular specialist. Reports bilateral compression stockings bruised her legs. States she will have a Biotab leg pump brought to her home next week to help in alleviating bilateral lower extremity edema. Currently prescribed  Torsemide 20 mg QD, elevating her legs as much as possible. States her legs are "heavy".   COPD, follow-up: Pt has a history of COPD. Current treatment includes Breztri inhaler BID and Albuterol PRN. States symptoms are well-controlled. Reports mild intertmittent dyspnea and wheezing with activity. Denies side effects of treatment. Denies recent exacerbations or hospitalizations. Former cigarette smoker, quit 2007.   Current Outpatient Medications on File Prior to Visit  Medication Sig Dispense Refill   atorvastatin (LIPITOR) 40 MG tablet Take 1 tablet (40 mg total) by mouth daily. Per ccm 90 tablet 3   Budeson-Glycopyrrol-Formoterol (BREZTRI AEROSPHERE) 160-9-4.8 MCG/ACT AERO Inhale 2 puffs into the lungs in the morning and at bedtime. 10.7 g 6   esomeprazole (NEXIUM) 40 MG capsule Take 40 mg by mouth daily as needed.      metoprolol succinate (TOPROL-XL) 25 MG 24 hr tablet Take 1 tablet (25 mg total) by mouth daily. 90 tablet 3   rOPINIRole (REQUIP) 2 MG tablet TAKE 1 TABLET(2 MG) BY MOUTH IN THE MORNING AND AT BEDTIME 180 tablet 6   torsemide (DEMADEX) 20 MG tablet Take 1 tablet (20 mg total) by mouth daily. (Patient not taking: Reported on 11/23/2021) 30 tablet 3   traMADol-acetaminophen (ULTRACET) 37.5-325 MG tablet Take 1 tablet by mouth every 8 (eight) hours as needed. 15 tablet 0   VENTOLIN HFA 108 (90 Base) MCG/ACT inhaler SMARTSIG:1-2 Puff(s) By Mouth Every 4 Hours PRN     No current facility-administered medications on file prior to  visit.   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 81 yo   BACK SURGERY  2016   lower L 4 to L  5   bladder tack and uterus lift  10/10/2015   IR RADIOLOGIST EVAL & MGMT  12/03/2018   IR RADIOLOGIST EVAL & MGMT  12/31/2018   PARATHYROIDECTOMY Left 07/02/2016   Procedure: LEFT INFERIOR PARATHYROIDECTOMY;  Surgeon: Armandina Gemma, MD;  Location: [facility];  Service: General;  Laterality: Left;   SHOULDER OPEN ROTATOR CUFF REPAIR  12/2019   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    Family History  Problem Relation Age of Onset   Emphysema Mother    Hyperlipidemia Sister    Hypertension Sister    COPD Sister    Hyperlipidemia Sister    Hypertension Sister    Cancer Sister        lymphoma   Obesity Brother    Hyperlipidemia Brother    Hypertension Brother    Social History   Socioeconomic History   Marital status: Married    Spouse name: Not on file   Number of children: Not on file   Years of education: Not on file   Highest education level: Not on file  Occupational History   Not on file  Tobacco Use   Smoking status: Former    Packs/day: 1.00    Years: 50.00    Total pack years: 50.00    Types: Cigarettes    Quit date: 06/11/2005    Years since quitting: 16.7   Smokeless tobacco: Never  Vaping Use   Vaping Use: Never used  Substance and Sexual Activity   Alcohol use: No   Drug use: No   Sexual activity: Yes    Partners: Male  Other Topics Concern   Not on file  Social History Narrative   Not on file   Social Determinants of Health   Financial Resource Strain: Low Risk  (02/15/2022)   Overall Financial Resource Strain (CARDIA)    Difficulty of Paying Living Expenses: Not hard at all  Food Insecurity: No Food Insecurity (02/15/2022)   Hunger Vital Sign    Worried About Running Out of Food in the Last Year: Never true    [place] in the Last Year: Never true  Transportation Needs: No Transportation Needs (02/15/2022)   PRAPARE - Hydrologist (Medical): No    Lack of Transportation (Non-Medical): No  Physical Activity:  Inactive (02/15/2022)   Exercise Vital Sign    Days of Exercise per Week: 0 days    Minutes of Exercise per Session: 0 min  Stress: No Stress Concern Present (02/15/2022)   [place]    Feeling of Stress : Only a little  Social Connections: Moderately Integrated (02/15/2022)   Social Connection and Isolation Panel [NHANES]    Frequency of Communication with Friends and Family: More than three times a week    Frequency of Social Gatherings with Friends and Family: More than three times a week    Attends Religious Services: More  than 4 times per year    Active Member of Clubs or Organizations: No    Attends Archivist Meetings: Never    Marital Status: Married    Review of Systems  Constitutional:  Negative for fatigue.  HENT:  Negative for congestion, ear pain and sore throat.   Respiratory:  Positive for shortness of breath and wheezing. Negative for cough.   Cardiovascular:  Positive for leg swelling (bilateral). Negative for chest pain.  Gastrointestinal:  Positive for diarrhea (intermittent) and nausea (intermittent). Negative for abdominal pain, constipation and vomiting.  Genitourinary:  Negative for dysuria, frequency and urgency.  Musculoskeletal:  Negative for arthralgias, back pain and myalgias.  Neurological:  Positive for dizziness (intermittent with position changes). Negative for headaches.  Psychiatric/Behavioral:  Negative for agitation and sleep disturbance. The patient is not nervous/anxious.      Objective:  BP 128/80 (BP Location: Left Arm, Patient Position: Sitting, Cuff Size: Normal)   Pulse 96   Temp (!) 97.3 F (36.3 C) (Temporal)   Ht '5\' 3"'  (1.6 m)   Wt 171 lb (77.6 kg)   SpO2 95%   BMI 30.29 kg/m       02/15/2022    1:28 PM 02/14/2022    2:20 PM 02/14/2022    2:08 PM  BP/Weight  Systolic BP 315 945 859  Diastolic BP 78 292 95  Wt. (Lbs) 159  159  BMI 28.17 kg/m2  28.17 kg/m2     Physical Exam Vitals reviewed.  Constitutional:      Appearance: Normal appearance.  HENT:     Right Ear: There is impacted cerumen.     Left Ear: Tympanic membrane normal.     Nose: Nose normal.     Mouth/Throat:     Mouth: Mucous membranes are moist.  Eyes:     Pupils: Pupils are equal, round, and reactive to light.     Comments: Eyeglasses in place  Neck:     Vascular: No carotid bruit.  Cardiovascular:     Rate and Rhythm: Normal rate and regular rhythm.     Heart sounds: Normal heart sounds.  Pulmonary:     Effort: Pulmonary effort is normal.     Breath sounds: Wheezing (left upper anterior lobes) present.  Abdominal:     General: Abdomen is flat. Bowel sounds are normal.     Palpations: Abdomen is soft.     Tenderness: There is no abdominal tenderness.  Musculoskeletal:        General: Swelling (bilateral lower extremities) present.  Skin:    General: Skin is warm and dry.     Capillary Refill: Capillary refill takes less than 2 seconds.  Neurological:     General: No focal deficit present.     Mental Status: She is alert and oriented to person, place, and time.  Psychiatric:        Mood and Affect: Mood normal.        Behavior: Behavior normal.    Lab Results  Component Value Date   WBC 9.6 10/24/2021   HGB 11.6 10/24/2021   HCT 35.9 10/24/2021   PLT 402 10/24/2021   GLUCOSE 99 11/23/2021   CHOL 133 10/24/2021   TRIG 75 10/24/2021   HDL 55 10/24/2021   LDLCALC 63 10/24/2021   ALT 14 11/23/2021   AST 19 11/23/2021   NA 141 11/23/2021   K 5.6 (H) 11/23/2021   CL 107 (H) 11/23/2021   CREATININE 1.95 (H) 11/23/2021   BUN  29 (H) 11/23/2021   CO2 20 11/23/2021   HGBA1C 5.6 04/04/2021      Assessment & Plan:   1. 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 Gastroesophageal reflux disease, unspecified whether esophagitis present - ondansetron (ZOFRAN) 4 MG tablet; Take 1 tablet (4 mg total) by mouth every 8 (eight) hours as needed for nausea or vomiting.  Dispense: 90 tablet; Refill: 1 - omeprazole (PRILOSEC) 40 MG capsule; Take 1 capsule (40 mg total) by mouth daily.  Dispense: 90 capsule; Refill: 3 - Comprehensive metabolic panel - CBC with Differential/Platelet  3. Chronic venous insufficiency of lower extremity - Comprehensive metabolic panel - CBC with Differential/Platelet -follow-up with venous specialist as scheduled  4. Age-related osteoporosis without current pathological fracture - Comprehensive metabolic panel - CBC with Differential/Platelet -continue Prolia as prescribed (Jan 2024 next injection)  5. Nausea - ondansetron (ZOFRAN) 4 MG tablet; Take 1 tablet (4 mg total) by mouth every 8 (eight) hours as needed for nausea or vomiting.  Dispense: 90 tablet; Refill: 1  6. Lymphedema - Comprehensive metabolic panel - CBC with Differential/Platelet -continue Torsemide 20 mg QD  7. Need for immunization against influenza - Flu Vaccine QUAD High Dose(Fluad)  8. Encounter for screening mammogram for malignant neoplasm of breast - MM Digital Screening; Future   We will call you with lab results and mammogram appointment Begin Protonix 40 mg daily for GERD Take Zofran 4 mg as needed for nausea Follow-up in 43-month   Follow-up: 378-month fasting  An After Visit Summary was printed and given to the patient.  I, ShRip HarbourNP, have reviewed all documentation for this visit. The documentation on 02/27/22 for the exam, diagnosis, procedures, and orders are all accurate and complete.    Signed, ShRip HarbourNP [phone]

## 2022-02-27 NOTE — Patient Instructions (Addendum)
We will call you with lab results and mammogram appointment Begin Protonix 40 mg daily for GERD Take Zofran 4 mg as needed for nausea Follow-up in 47-month    Food Choices for Gastroesophageal Reflux Disease, Adult When you have gastroesophageal reflux disease (GERD), the foods you eat and your eating habits are very important. Choosing the right foods can help ease your discomfort. Think about working with a food expert (dietitian) to help you make good choices. What are tips for following this plan? Reading food labels Look for foods that are low in saturated fat. Foods that may help with your symptoms include: Foods that have less than 5% of daily value (DV) of fat. Foods that have 0 grams of trans fat. Cooking Do not fry your food. Cook your food by baking, steaming, grilling, or broiling. These are all methods that do not need a lot of fat for cooking. To add flavor, try to use herbs that are low in spice and acidity. Meal planning  Choose healthy foods that are low in fat, such as: Fruits and vegetables. Whole grains. Low-fat dairy products. Lean meats, fish, and poultry. Eat small meals often instead of eating 3 large meals each day. Eat your meals slowly in a place where you are relaxed. Avoid bending over or lying down until 2-3 hours after eating. Limit high-fat foods such as fatty meats or fried foods. Limit your intake of fatty foods, such as oils, butter, and shortening. Avoid the following as told by your doctor: Foods that cause symptoms. These may be different for different people. Keep a food diary to keep track of foods that cause symptoms. Alcohol. Drinking a lot of liquid with meals. Eating meals during the 2-3 hours before bed. Lifestyle Stay at a healthy weight. Ask your doctor what weight is healthy for you. If you need to lose weight, work with your doctor to do so safely. Exercise for at least 30 minutes on 5 or more days each week, or as told by your  doctor. Wear loose-fitting clothes. Do not smoke or use any products that contain nicotine or tobacco. If you need help quitting, ask your doctor. Sleep with the head of your bed higher than your feet. Use a wedge under the mattress or blocks under the bed frame to raise the head of the bed. Chew sugar-free gum after meals. What foods should eat?  Eat a healthy, well-balanced diet of fruits, vegetables, whole grains, low-fat dairy products, lean meats, fish, and poultry. Each person is different. Foods that may cause symptoms in one person may not cause any symptoms in another person. Work with your doctor to find foods that are safe for you. The items listed above may not be a complete list of what you can eat and drink. Contact a food expert for more options. What foods should I avoid? Limiting some of these foods may help in managing the symptoms of GERD. Everyone is different. Talk with a food expert or your doctor to help you find the exact foods to avoid, if any. Fruits Any fruits prepared with added fat. Any fruits that cause symptoms. For some people, this may include citrus fruits, such as oranges, grapefruit, pineapple, and lemons. Vegetables Deep-fried vegetables. [place]. Any vegetables prepared with added fat. Any vegetables that cause symptoms. For some people, this may include tomatoes and tomato products, chili peppers, onions and garlic, and horseradish. Grains Pastries or quick breads with added fat. Meats and other proteins High-fat meats, such as  fatty beef or pork, hot dogs, ribs, ham, sausage, salami, and bacon. Fried meat or protein, including fried fish and fried chicken. Nuts and nut butters, in large amounts. Dairy Whole milk and chocolate milk. Sour cream. Cream. Ice cream. Cream cheese. Milkshakes. Fats and oils Butter. Margarine. Shortening. Ghee. Beverages Coffee and tea, with or without caffeine. Carbonated beverages. Sodas. Energy drinks. Fruit juice made  with acidic fruits, such as orange or grapefruit. Tomato juice. Alcoholic drinks. Sweets and desserts Chocolate and cocoa. Donuts. Seasonings and condiments Pepper. Peppermint and spearmint. Added salt. Any condiments, herbs, or seasonings that cause symptoms. For some people, this may include curry, hot sauce, or vinegar-based salad dressings. The items listed above may not be a complete list of what you should not eat and drink. Contact a food expert for more options. Questions to ask your doctor Diet and lifestyle changes are often the first steps that are taken to manage symptoms of GERD. If diet and lifestyle changes do not help, talk with your doctor about taking medicines. Where to find more information [employer] for Gastrointestinal Disorders: aboutgerd.org Summary When you have GERD, food and lifestyle choices are very important in easing your symptoms. Eat small meals often instead of 3 large meals a day. Eat your meals slowly and in a place where you are relaxed. Avoid bending over or lying down until 2-3 hours after eating. Limit high-fat foods such as fatty meats or fried foods. This information is not intended to replace advice given to you by your health care provider. Make sure you discuss any questions you have with your health care provider. Document Revised: 12/07/2019 Document Reviewed: 12/07/2019 Elsevier Patient Education  [place] for Chronic Kidney Disease Chronic kidney disease (CKD) is when your kidneys are not working well. They cannot remove waste, fluids, and other substances from your blood the way they should. These substances can build up, which can worsen kidney damage and affect how your body works. Eating certain foods can lead to a buildup of these substances. Changing your diet can help prevent more kidney damage. Diet changes may also delay dialysis or even keep you from needing it. What nutrients should I  limit? Work with your treatment team and a food expert (dietitian) to make a meal plan that's right for you. Foods you can eat and foods you should limit or avoid will depend on the stage of your kidney disease and any other health conditions you have. The items listed below are not a complete list. Talk with your dietitian to learn what is best for you. Potassium Potassium affects how steadily your heart beats. Too much potassium in your blood can cause an irregular heartbeat or even a heart attack. You may need to limit foods that are high in potassium, such as: Liquid milk and soy milk. Salt substitutes that contain potassium. Fruits like bananas, apricots, nectarines, melon, prunes, raisins, kiwi, and oranges. Vegetables, such as potatoes, sweet potatoes, yams, tomatoes, leafy greens, beets, avocado, pumpkin, and winter squash. Beans, like lima beans. Nuts. Phosphorus Phosphorus is a mineral found in your bones. You need a balance between calcium and phosphorus to build and maintain healthy bones. Too much added phosphorus from the foods you eat can pull calcium from your bones. Losing calcium can make your bones weak and more likely to break. Too much phosphorus can also make your skin itch. You may need to limit foods that are high in phosphorus or that have added  phosphorus, such as: Liquid milk and dairy products. Dark-colored sodas or soft drinks. Bran cereals and oatmeal. Protein  Protein helps you make and keep muscle. Protein also helps to repair your body's cells and tissues. One of the natural breakdown products of protein is a waste product called urea. When your kidneys are not working well, they cannot remove types of waste like urea. Reducing protein in your diet can help keep urea from building up in your blood. Depending on your stage of kidney disease, you may need to eat smaller portions of foods that are high in protein. Sources of animal protein include: Meat (all  types). Fish and seafood. Poultry. Eggs. Dairy. Other protein foods include: Beans and legumes. Nuts and nut butter. Soy, like tofu.  Sodium Salt (sodium) helps to keep a healthy balance of fluids in your body. Too much salt can increase your blood pressure, which can harm your heart and lungs. Extra salt can also cause your body to keep too much fluid, making your kidneys work harder. You may need to limit or avoid foods that are high in salt, such as: Salt seasonings. Soy and teriyaki sauce. Packaged, precooked, cured, or processed meats, such as sausages or meat loaves. Sardines. Salted crackers and snack foods. Fast food. Canned soups and most canned foods. Pickled foods. Vegetable juice. Boxed mixes or ready-to-eat boxed meals and side dishes. Bottled dressings, sauces, and marinades. Talk with your dietitian about how much potassium, phosphorus, protein, and salt you may have each day. Helpful tips Read food labels  Check the amount of salt in foods. Limit foods that have salt or sodium listed among the first five ingredients. Try to eat low-salt foods. Check the ingredient list for added phosphorus or potassium. "Phos" in an ingredient is a sign that phosphorus has been added. Do not buy foods that are calcium-enriched or that have calcium added to them (are fortified). Buy canned vegetables and beans that say "no salt added" and rinse them before eating. Lifestyle Limit the amount of protein you eat from animal sources each day. Focus on protein from plant sources, like tofu and dried beans, peas, and lentils. Do not add salt to food when cooking or before eating. Do not eat star fruit. It can be toxic for people with kidney problems. Talk with your health care provider before taking any vitamin or mineral supplements. If told by your health care provider, track how much liquid you drink so you can avoid drinking too much. You may need to include foods you eat that are  made mostly from water, like gelatin, ice cream, soups, and juicy fruits and vegetables. If you have diabetes: If you have diabetes (diabetes mellitus) and CKD, you need to keep your blood sugar (glucose) in the target range recommended by your health care provider. Follow your diabetes management plan. This may include: Checking your blood glucose regularly. Taking medicines by mouth, or taking insulin, or both. Exercising for at least 30 minutes on 5 or more days each week, or as told by your health care provider. Tracking how many servings of carbohydrates you eat at each meal. Not using orange juice to treat low blood sugars. Instead, use apple juice, cranberry juice, or clear soda. You may be given guidelines on what foods and nutrients you may eat, and how much you can have each day. This depends on your stage of kidney disease and whether you have high blood pressure (hypertension). Follow the meal plan your dietitian gives you. To  learn more: [employer] of Diabetes and Digestive and Kidney Diseases: [employer]: kidney.org Summary Chronic kidney disease (CKD) is when your kidneys are not working well. They cannot remove waste, fluids, and other substances from your blood the way they should. These substances can build up, which can worsen kidney damage and affect how your body works. Changing your diet can help prevent more kidney damage. Diet changes may also delay dialysis or even keep you from needing it. Diet changes are different for each person with CKD. Work with a dietitian to set up a meal plan that is right for you. This information is not intended to replace advice given to you by your health care provider. Make sure you discuss any questions you have with your health care provider. Document Revised: 09/15/2021 Document Reviewed: 09/21/2019 Elsevier Patient Education  [place].

## 2022-02-28 LAB — COMPREHENSIVE METABOLIC PANEL
ALT: 17 IU/L (ref 0–32)
AST: 17 IU/L (ref 0–40)
Albumin/Globulin Ratio: 1.3 (ref 1.2–2.2)
Albumin: 3.4 g/dL — ABNORMAL LOW (ref 3.8–4.8)
Alkaline Phosphatase: 94 IU/L (ref 44–121)
BUN/Creatinine Ratio: 10 — ABNORMAL LOW (ref 12–28)
BUN: 26 mg/dL (ref 8–27)
Bilirubin Total: 0.4 mg/dL (ref 0.0–1.2)
CO2: 18 mmol/L — ABNORMAL LOW (ref 20–29)
Calcium: 7.1 mg/dL — ABNORMAL LOW (ref 8.7–10.3)
Chloride: 109 mmol/L — ABNORMAL HIGH (ref 96–106)
Creatinine, Ser: 2.54 mg/dL — ABNORMAL HIGH (ref 0.57–1.00)
Globulin, Total: 2.6 g/dL (ref 1.5–4.5)
Glucose: 103 mg/dL — ABNORMAL HIGH (ref 70–99)
Potassium: 5.8 mmol/L — ABNORMAL HIGH (ref 3.5–5.2)
Sodium: 142 mmol/L (ref 134–144)
Total Protein: 6 g/dL (ref 6.0–8.5)
eGFR: 19 mL/min/{1.73_m2} — ABNORMAL LOW (ref >59)

## 2022-02-28 LAB — LIPID PANEL
Chol/HDL Ratio: 2.5 ratio (ref 0.0–4.4)
Cholesterol, Total: 110 mg/dL (ref 100–199)
HDL: 44 mg/dL (ref >39)
LDL Chol Calc (NIH): 50 mg/dL (ref 0–99)
Triglycerides: 77 mg/dL (ref 0–149)
VLDL Cholesterol Cal: 16 mg/dL (ref 5–40)

## 2022-02-28 LAB — CBC WITH DIFFERENTIAL/PLATELET
Basophils Absolute: 0 10*3/uL (ref 0.0–0.2)
Basos: 0 %
EOS (ABSOLUTE): 0.3 10*3/uL (ref 0.0–0.4)
Eos: 3 %
Hematocrit: 32.5 % — ABNORMAL LOW (ref 34.0–46.6)
Hemoglobin: 10.5 g/dL — ABNORMAL LOW (ref 11.1–15.9)
Immature Grans (Abs): 0.1 10*3/uL (ref 0.0–0.1)
Immature Granulocytes: 1 %
Lymphocytes Absolute: 1.8 10*3/uL (ref 0.7–3.1)
Lymphs: 16 %
MCH: 30 pg (ref 26.6–33.0)
MCHC: 32.3 g/dL (ref 31.5–35.7)
MCV: 93 fL (ref 79–97)
Monocytes Absolute: 0.7 10*3/uL (ref 0.1–0.9)
Monocytes: 6 %
Neutrophils Absolute: 8.4 10*3/uL — ABNORMAL HIGH (ref 1.4–7.0)
Neutrophils: 74 %
Platelets: 375 10*3/uL (ref 150–450)
RBC: 3.5 x10E6/uL — ABNORMAL LOW (ref 3.77–5.28)
RDW: 12.8 % (ref 11.7–15.4)
WBC: 11.5 10*3/uL — ABNORMAL HIGH (ref 3.4–10.8)

## 2022-02-28 LAB — CARDIOVASCULAR RISK ASSESSMENT

## 2022-03-02 ENCOUNTER — Telehealth: Payer: Self-pay

## 2022-03-02 NOTE — Progress Notes (Unsigned)
Chronic Care Management Pharmacy Assistant   Name: Alexandra [name]  MRN: [id number] DOB: 09/06/1940   Reason for Encounter: General Adherence Call    Recent office visits:  02/27/22 Jerrell Belfast NP. Seen for routine visit. Started on Oemprazole '40mg'$  and Ondansetron HCI '4mg'$ . D/C Esomeprazole.   02/15/22 Cherly Beach NP. Medicare Annual Wellness. No med changes.  12/26/21 Jerrell Belfast NP. Seen for Lymphedema. No med changes.   Recent consult visits:  02/14/22 (Vascular Surgery) Deitra Mayo MD. Seen for Lymphedema. No med changes.   Hospital visits:  None  Medications: Outpatient Encounter Medications as of 03/02/2022  Medication Sig   atorvastatin (LIPITOR) 40 MG tablet Take 1 tablet (40 mg total) by mouth daily. Per ccm   Budeson-Glycopyrrol-Formoterol (BREZTRI AEROSPHERE) 160-9-4.8 MCG/ACT AERO Inhale 2 puffs into the lungs in the morning and at bedtime.   metoprolol succinate (TOPROL-XL) 25 MG 24 hr tablet Take 1 tablet (25 mg total) by mouth daily.   omeprazole (PRILOSEC) 40 MG capsule Take 1 capsule (40 mg total) by mouth daily.   ondansetron (ZOFRAN) 4 MG tablet Take 1 tablet (4 mg total) by mouth every 8 (eight) hours as needed for nausea or vomiting.   rOPINIRole (REQUIP) 2 MG tablet TAKE 1 TABLET(2 MG) BY MOUTH IN THE MORNING AND AT BEDTIME   torsemide (DEMADEX) 20 MG tablet Take 1 tablet (20 mg total) by mouth daily.   traMADol-acetaminophen (ULTRACET) 37.5-325 MG tablet Take 1 tablet by mouth every 8 (eight) hours as needed.   VENTOLIN HFA 108 (90 Base) MCG/ACT inhaler SMARTSIG:1-2 Puff(s) By Mouth Every 4 Hours PRN   No facility-administered encounter medications on file as of 03/02/2022.    Contacted [name] for General Review Call   Chart Review:  Have there been any documented new, changed, or discontinued medications since last visit? Yes Has there been any documented recent hospitalizations or ED visits since last visit with Clinical  Pharmacist? No Brief Summary (including medication and/or Diagnosis changes):   Adherence Review:  Does the Clinical Pharmacist Assistant have access to adherence rates? Yes Adherence rates for STAR metric medications (List medication(s)/day supply/ last 2 fill dates). Atorvastatin 01/26/22-10/24/21 90ds Adherence rates for medications indicated for disease state being reviewed (List medication(s)/day supply/ last 2 fill dates). Does the patient have >5 day gap between last estimated fill dates for any of the above medications or other medication gaps? No Reason for medication gaps.   Disease State Questions:  Able to connect with Patient? {yes/no:20286} Did patient have any problems with their health recently? {yes/no:20286} Note problems and Concerns: Have you had any admissions or emergency room visits or worsening of your condition(s) since last visit? {yes/no:20286} Details of ED visit, hospital visit and/or worsening condition(s): Have you had any visits with new specialists or providers since your last visit? {yes/no:20286} Explain: Have you had any new health care problem(s) since your last visit? {yes/no:20286} New problem(s) reported: Have you run out of any of your medications since you last spoke with clinical pharmacist? {yes/no:20286} What caused you to run out of your medications? Are there any medications you are not taking as prescribed? {yes/no:20286} What kept you from taking your medications as prescribed? Are you having any issues or side effects with your medications? {yes/no:20286} Note of issues or side effects: Do you have any other health concerns or questions you want to discuss with your Clinical Pharmacist before your next visit? {yes/no:20286} Note additional concerns and questions from Patient. Are there any  health concerns that you feel we can do a better job addressing? {yes/no:20286} Note Patient's response. Are you having any problems with any of the  following since the last visit: (select all that apply)  {General Call:27390}  Details: 12. Any falls since last visit? {yes/no:20286}  Details: 13. Any increased or uncontrolled pain since last visit? {yes/no:20286}  Details: 14. Next visit Type: {Telephone/Office:25179}       Visit with:        Date:        Time:  28. Additional Details? {yes/no:20286}    Elray Mcgregor, [place] Pharmacist Assistant  [phone]

## 2022-03-07 ENCOUNTER — Ambulatory Visit
Admission: RE | Admit: 2022-03-07 | Discharge: 2022-03-07 | Disposition: A | Discharge status: Home / Self Care | Payer: Medicare Other | Source: Ambulatory Visit | Attending: Nurse Practitioner | Admitting: Nurse Practitioner

## 2022-03-07 DIAGNOSIS — Z1231 Encounter for screening mammogram for malignant neoplasm of breast: Secondary | ICD-10-CM | POA: Diagnosis not present

## 2022-03-15 DIAGNOSIS — D494 Neoplasm of unspecified behavior of bladder: Secondary | ICD-10-CM | POA: Diagnosis not present

## 2022-03-15 DIAGNOSIS — N131 Hydronephrosis with ureteral stricture, not elsewhere classified: Secondary | ICD-10-CM | POA: Diagnosis not present

## 2022-03-15 DIAGNOSIS — Z79899 Other long term (current) drug therapy: Secondary | ICD-10-CM | POA: Diagnosis not present

## 2022-03-15 DIAGNOSIS — C672 Malignant neoplasm of lateral wall of bladder: Secondary | ICD-10-CM | POA: Diagnosis not present

## 2022-03-15 DIAGNOSIS — K219 Gastro-esophageal reflux disease without esophagitis: Secondary | ICD-10-CM | POA: Diagnosis not present

## 2022-03-15 DIAGNOSIS — I10 Essential (primary) hypertension: Secondary | ICD-10-CM | POA: Diagnosis not present

## 2022-03-15 DIAGNOSIS — J449 Chronic obstructive pulmonary disease, unspecified: Secondary | ICD-10-CM | POA: Diagnosis not present

## 2022-03-16 DIAGNOSIS — C672 Malignant neoplasm of lateral wall of bladder: Secondary | ICD-10-CM | POA: Diagnosis not present

## 2022-03-18 DIAGNOSIS — R0600 Dyspnea, unspecified: Secondary | ICD-10-CM | POA: Diagnosis not present

## 2022-03-18 DIAGNOSIS — I509 Heart failure, unspecified: Secondary | ICD-10-CM | POA: Diagnosis not present

## 2022-03-18 DIAGNOSIS — R0602 Shortness of breath: Secondary | ICD-10-CM | POA: Diagnosis not present

## 2022-03-18 DIAGNOSIS — Z0389 Encounter for observation for other suspected diseases and conditions ruled out: Secondary | ICD-10-CM | POA: Diagnosis not present

## 2022-03-18 DIAGNOSIS — I10 Essential (primary) hypertension: Secondary | ICD-10-CM | POA: Diagnosis not present

## 2022-03-18 DIAGNOSIS — R9431 Abnormal electrocardiogram [ECG] [EKG]: Secondary | ICD-10-CM | POA: Diagnosis not present

## 2022-03-18 DIAGNOSIS — J90 Pleural effusion, not elsewhere classified: Secondary | ICD-10-CM | POA: Diagnosis not present

## 2022-03-18 DIAGNOSIS — J9811 Atelectasis: Secondary | ICD-10-CM | POA: Diagnosis not present

## 2022-03-19 DIAGNOSIS — J449 Chronic obstructive pulmonary disease, unspecified: Secondary | ICD-10-CM | POA: Diagnosis not present

## 2022-03-19 DIAGNOSIS — I10 Essential (primary) hypertension: Secondary | ICD-10-CM | POA: Diagnosis not present

## 2022-03-19 DIAGNOSIS — Z79899 Other long term (current) drug therapy: Secondary | ICD-10-CM | POA: Diagnosis not present

## 2022-03-19 DIAGNOSIS — R0602 Shortness of breath: Secondary | ICD-10-CM | POA: Diagnosis not present

## 2022-03-19 DIAGNOSIS — E7800 Pure hypercholesterolemia, unspecified: Secondary | ICD-10-CM | POA: Diagnosis not present

## 2022-03-19 DIAGNOSIS — J9811 Atelectasis: Secondary | ICD-10-CM | POA: Diagnosis not present

## 2022-03-19 DIAGNOSIS — D75839 Thrombocytosis, unspecified: Secondary | ICD-10-CM | POA: Diagnosis not present

## 2022-03-19 DIAGNOSIS — Z91041 Radiographic dye allergy status: Secondary | ICD-10-CM | POA: Diagnosis not present

## 2022-03-19 DIAGNOSIS — N390 Urinary tract infection, site not specified: Secondary | ICD-10-CM | POA: Diagnosis not present

## 2022-03-19 DIAGNOSIS — Z85528 Personal history of other malignant neoplasm of kidney: Secondary | ICD-10-CM | POA: Diagnosis not present

## 2022-03-19 DIAGNOSIS — M1990 Unspecified osteoarthritis, unspecified site: Secondary | ICD-10-CM | POA: Diagnosis not present

## 2022-03-19 DIAGNOSIS — N1832 Chronic kidney disease, stage 3b: Secondary | ICD-10-CM | POA: Diagnosis not present

## 2022-03-19 DIAGNOSIS — I5033 Acute on chronic diastolic (congestive) heart failure: Secondary | ICD-10-CM | POA: Diagnosis not present

## 2022-03-19 DIAGNOSIS — R9431 Abnormal electrocardiogram [ECG] [EKG]: Secondary | ICD-10-CM | POA: Diagnosis not present

## 2022-03-19 DIAGNOSIS — R6510 Systemic inflammatory response syndrome (SIRS) of non-infectious origin without acute organ dysfunction: Secondary | ICD-10-CM | POA: Diagnosis not present

## 2022-03-19 DIAGNOSIS — Z0389 Encounter for observation for other suspected diseases and conditions ruled out: Secondary | ICD-10-CM | POA: Diagnosis not present

## 2022-03-19 DIAGNOSIS — C672 Malignant neoplasm of lateral wall of bladder: Secondary | ICD-10-CM | POA: Diagnosis not present

## 2022-03-19 DIAGNOSIS — I130 Hypertensive heart and chronic kidney disease with heart failure and stage 1 through stage 4 chronic kidney disease, or unspecified chronic kidney disease: Secondary | ICD-10-CM | POA: Diagnosis not present

## 2022-03-19 DIAGNOSIS — N179 Acute kidney failure, unspecified: Secondary | ICD-10-CM | POA: Diagnosis not present

## 2022-03-19 DIAGNOSIS — J90 Pleural effusion, not elsewhere classified: Secondary | ICD-10-CM | POA: Diagnosis not present

## 2022-03-19 DIAGNOSIS — Z87891 Personal history of nicotine dependence: Secondary | ICD-10-CM | POA: Diagnosis not present

## 2022-03-19 DIAGNOSIS — N1330 Unspecified hydronephrosis: Secondary | ICD-10-CM | POA: Diagnosis not present

## 2022-03-19 DIAGNOSIS — Z905 Acquired absence of kidney: Secondary | ICD-10-CM | POA: Diagnosis not present

## 2022-03-19 DIAGNOSIS — M109 Gout, unspecified: Secondary | ICD-10-CM | POA: Diagnosis not present

## 2022-03-19 DIAGNOSIS — K219 Gastro-esophageal reflux disease without esophagitis: Secondary | ICD-10-CM | POA: Diagnosis not present

## 2022-03-19 DIAGNOSIS — I509 Heart failure, unspecified: Secondary | ICD-10-CM | POA: Diagnosis not present

## 2022-03-19 DIAGNOSIS — E039 Hypothyroidism, unspecified: Secondary | ICD-10-CM | POA: Diagnosis not present

## 2022-03-19 DIAGNOSIS — R0902 Hypoxemia: Secondary | ICD-10-CM | POA: Diagnosis not present

## 2022-03-19 DIAGNOSIS — G2581 Restless legs syndrome: Secondary | ICD-10-CM | POA: Diagnosis not present

## 2022-03-19 DIAGNOSIS — R0600 Dyspnea, unspecified: Secondary | ICD-10-CM | POA: Diagnosis not present

## 2022-03-19 DIAGNOSIS — D631 Anemia in chronic kidney disease: Secondary | ICD-10-CM | POA: Diagnosis not present

## 2022-03-19 DIAGNOSIS — I5031 Acute diastolic (congestive) heart failure: Secondary | ICD-10-CM | POA: Diagnosis not present

## 2022-03-26 ENCOUNTER — Telehealth: Payer: Self-pay

## 2022-03-26 NOTE — Patient Outreach (Signed)
  Care Coordination TOC Note Transition Care Management Unsuccessful Follow-up Telephone Call  Date of discharge and from where:  03/23/22-[facility]   Attempts:  1st Attempt  Reason for unsuccessful TCM follow-up call:  Unable to leave message-voicemail full.   Enzo Montgomery, RN,BSN,CCM [facility] [facility] Telephonic Care Management Coordinator Direct Phone: [phone] Toll Free: [phone] Fax: [phone]

## 2022-03-27 ENCOUNTER — Encounter: Payer: Self-pay | Admitting: Legal Medicine

## 2022-03-27 ENCOUNTER — Ambulatory Visit (INDEPENDENT_AMBULATORY_CARE_PROVIDER_SITE_OTHER): Payer: Medicare Other | Admitting: Legal Medicine

## 2022-03-27 ENCOUNTER — Telehealth: Payer: Self-pay

## 2022-03-27 VITALS — BP 120/70 | HR 86 | Temp 98.8°F | Resp 15 | Ht 63.0 in | Wt 154.0 lb

## 2022-03-27 DIAGNOSIS — I5033 Acute on chronic diastolic (congestive) heart failure: Secondary | ICD-10-CM | POA: Diagnosis not present

## 2022-03-27 DIAGNOSIS — I10 Essential (primary) hypertension: Secondary | ICD-10-CM | POA: Diagnosis not present

## 2022-03-27 DIAGNOSIS — N189 Chronic kidney disease, unspecified: Secondary | ICD-10-CM

## 2022-03-27 DIAGNOSIS — I5031 Acute diastolic (congestive) heart failure: Secondary | ICD-10-CM

## 2022-03-27 DIAGNOSIS — N179 Acute kidney failure, unspecified: Secondary | ICD-10-CM

## 2022-03-27 DIAGNOSIS — F5102 Adjustment insomnia: Secondary | ICD-10-CM | POA: Diagnosis not present

## 2022-03-27 MED ORDER — ESZOPICLONE 2 MG PO TABS: 2.0000 mg | ORAL_TABLET | Freq: Every evening | ORAL | 2 refills | Status: DC | PRN

## 2022-03-27 MED ORDER — ENTRESTO 24-26 MG PO TABS: 1.0000 | ORAL_TABLET | Freq: Two times a day (BID) | ORAL | 6 refills | Status: DC

## 2022-03-27 NOTE — Patient Outreach (Signed)
  Care Coordination TOC Note Transition Care Management Follow-up Telephone Call Date of discharge and from where: 03/23/22-[facility]  Dx: bladder surgery How have you been since you were released from the hospital? Patient voices she is doing well. States surgery went well and is hopeful she will get good results regarding the biopsy. Any questions or concerns? Yes-she reports some occasional dry heaves and nausea feelings at time. Denies any vomiting. Reviewed med list with pt-she states she stopped taking Zofran last month as it was causing her to have a lot of itching. She voices the same with Prilosec. She has PCP follow up appt and will take meds to appt and discuss these issues with MD. Discussed non-pharmacological measures to aide in relief of sxs. Appetite has been fair.  Items Reviewed: Did the pt receive and understand the discharge instructions provided? Yes  Medications obtained and verified? Yes  Other? Yes  Any new allergies since your discharge? No  Dietary orders reviewed? Yes Do you have support at home? Yes   Home Care and Equipment/Supplies: Were home health services ordered? not applicable If so, what is the name of the agency? N/A  Has the agency set up a time to come to the patient's home? not applicable Were any new equipment or medical supplies ordered?  No What is the name of the medical supply agency? N/A Were you able to get the supplies/equipment? not applicable Do you have any questions related to the use of the equipment or supplies? No  Functional Questionnaire: (I = Independent and D = Dependent) ADLs: I  Bathing/Dressing- I  Meal Prep- I  Eating- I  Maintaining continence- I  Transferring/Ambulation- I  Managing Meds- I  Follow up appointments reviewed:  PCP Hospital f/u appt confirmed? Yes  Scheduled to see PCP this afternoon. [facility] f/u appt confirmed?  Scheduled to see surgeon on 04/06/22. Are transportation  arrangements needed? No  If their condition worsens, is the pt aware to call PCP or go to the Emergency Dept.? Yes Was the patient provided with contact information for the PCP's office or ED? Yes Was to pt encouraged to call back with questions or concerns? Yes  SDOH assessments and interventions completed:   Yes  Care Coordination Interventions Activated:  Yes   Care Coordination Interventions:  Education provided    Encounter Outcome:  Pt. Visit Completed   Enzo Montgomery, RN,BSN,CCM [facility] Telephonic Care Management Coordinator Direct Phone: [phone] Toll Free: [phone] Fax: [phone]

## 2022-03-27 NOTE — Progress Notes (Signed)
Subjective:  Patient ID: Alexandra [name], female    DOB: 04-Jun-1941  Age: 81 y.o. MRN: [id number]  Chief Complaint  Patient presents with   Transitions Of Care   Edema    HPI: transion of care and reconciliation of medicines Complex patient Patient was admitted at [facility] on 03/19/2022 and discharged 03/23/2022 stable at home. 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 She had this diagnosis of cancer for last year and has been seeing Dr. Donnamarie Poag.She is to see him soon about biopsy results.  New onset of HFpEF.  They diuresed 17 lbs.  She is not dyspneic. She is having insomnia. She had diastolic dysfunction in the past.  No chest pain but had progressing pedal eda and DOE for weeks prior.  No past diagnosis of CHF.  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  She was sent to home with Cefdinir 300 mg BID due to fever, patient will need close monitoring daily weights.  Needs cardiology consult. Current Outpatient Medications on File Prior to Visit  Medication Sig Dispense Refill   atorvastatin (LIPITOR) 40 MG tablet Take 1 tablet (40 mg total) by mouth daily. Per ccm 90 tablet 3   Budeson-Glycopyrrol-Formoterol (BREZTRI AEROSPHERE) 160-9-4.8 MCG/ACT AERO Inhale 2 puffs into the lungs in the morning and at bedtime. 10.7 g 6   cefdinir (OMNICEF) 300 MG capsule Take 300 mg by mouth 2 (two) times daily.     metoprolol succinate (TOPROL-XL) 25 MG 24 hr tablet Take 1 tablet (25 mg total) by mouth daily. 90 tablet 3   rOPINIRole (REQUIP) 2 MG tablet TAKE 1 TABLET(2 MG) BY MOUTH IN THE MORNING AND AT BEDTIME 180 tablet 6   torsemide (DEMADEX) 20 MG tablet Take 1 tablet (20 mg total) by mouth daily. 30 tablet 3   traMADol-acetaminophen (ULTRACET) 37.5-325 MG tablet Take 1 tablet by mouth every 8  (eight) hours as needed. 15 tablet 0   VENTOLIN HFA 108 (90 Base) MCG/ACT inhaler SMARTSIG:1-2 Puff(s) By Mouth Every 4 Hours PRN     No current facility-administered medications on file prior to visit.   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 81 yo   BACK SURGERY  2016   lower L 4 to L 5   bladder tack and uterus lift  10/10/2015   IR RADIOLOGIST EVAL & MGMT  12/03/2018   IR RADIOLOGIST EVAL & MGMT  12/31/2018   PARATHYROIDECTOMY Left 07/02/2016   Procedure: LEFT INFERIOR PARATHYROIDECTOMY;  Surgeon: Armandina Gemma, MD;  Location: [facility];  Service: General;  Laterality: Left;   SHOULDER OPEN ROTATOR CUFF REPAIR  12/2019   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    Family History  Problem Relation Age of Onset   Emphysema Mother    Hyperlipidemia Sister    Hypertension Sister    COPD Sister    Hyperlipidemia Sister    Hypertension Sister    Cancer Sister  lymphoma   Obesity Brother    Hyperlipidemia Brother    Hypertension Brother    Breast cancer Neg Hx    Social History   Socioeconomic History   Marital status: Married    Spouse name: Not on file   Number of children: Not on file   Years of education: Not on file   Highest education level: Not on file  Occupational History   Not on file  Tobacco Use   Smoking status: Former    Packs/day: 1.00    Years: 50.00    Total pack years: 50.00    Types: Cigarettes    Quit date: 06/11/2005    Years since quitting: 16.8   Smokeless tobacco: Never  Vaping Use   Vaping Use: Never used  Substance and Sexual Activity   Alcohol use: No   Drug use: No    Sexual activity: Yes    Partners: Male  Other Topics Concern   Not on file  Social History Narrative   Not on file   Social Determinants of Health   Financial Resource Strain: Low Risk  (02/15/2022)   Overall Financial Resource Strain (CARDIA)    Difficulty of Paying Living Expenses: Not hard at all  Food Insecurity: No Food Insecurity (03/27/2022)   Hunger Vital Sign    Worried About Running Out of Food in the Last Year: Never true    [place] in the Last Year: Never true  Transportation Needs: No Transportation Needs (03/27/2022)   PRAPARE - Hydrologist (Medical): No    Lack of Transportation (Non-Medical): No  Physical Activity: Inactive (02/15/2022)   Exercise Vital Sign    Days of Exercise per Week: 0 days    Minutes of Exercise per Session: 0 min  Stress: No Stress Concern Present (02/15/2022)   [place]    Feeling of Stress : Only a little  Social Connections: Moderately Integrated (02/15/2022)   Social Connection and Isolation Panel [NHANES]    Frequency of Communication with Friends and Family: More than three times a week    Frequency of Social Gatherings with Friends and Family: More than three times a week    Attends Religious Services: More than 4 times per year    Active Member of [employer] or Organizations: No    Attends Archivist Meetings: Never    Marital Status: Married    Review of Systems  Constitutional:  Negative for chills, fatigue and fever.  HENT:  Negative for congestion, ear pain and sore throat.   Eyes:  Negative for visual disturbance.  Respiratory:  Negative for cough and shortness of breath.   Cardiovascular:  Negative for chest pain and palpitations.  Gastrointestinal:  Positive for nausea. Negative for abdominal pain, constipation, diarrhea and vomiting.  Endocrine: Negative for polydipsia, polyphagia and polyuria.  Genitourinary:   Negative for difficulty urinating and dysuria.  Musculoskeletal:  Negative for arthralgias, back pain and myalgias.  Skin:  Negative for rash.  Neurological:  Negative for headaches.  Psychiatric/Behavioral:  Negative for dysphoric mood. The patient is not nervous/anxious.      Objective:  BP 120/70   Pulse 86   Temp 98.8 F (37.1 C)   Resp 15   Ht '5\' 3"'$  (1.6 m)   Wt 154 lb (69.9 kg)   SpO2 94%   BMI 27.28 kg/m      03/27/2022    1:50 PM 02/27/2022  7:33 AM 02/15/2022    1:28 PM  BP/Weight  Systolic BP 272 536 644  Diastolic BP 70 80 78  Wt. (Lbs) 154 171 159  BMI 27.28 kg/m2 30.29 kg/m2 28.17 kg/m2    Physical Exam HENT:     Head: Normocephalic and atraumatic.     Right Ear: Tympanic membrane normal.     Left Ear: Tympanic membrane normal.     Nose: Nose normal.     Mouth/Throat:     Mouth: Mucous membranes are moist.  Eyes:     Extraocular Movements: Extraocular movements intact.     Conjunctiva/sclera: Conjunctivae normal.     Pupils: Pupils are equal, round, and reactive to light.  Cardiovascular:     Rate and Rhythm: Normal rate.     Pulses: Normal pulses.     Heart sounds:     Gallop present.     Comments: S3 gallop Pulmonary:     Effort: Pulmonary effort is normal.     Breath sounds: Rhonchi present.  Abdominal:     General: Abdomen is flat. Bowel sounds are normal. There is no distension.     Palpations: Abdomen is soft.     Tenderness: There is no abdominal tenderness.  Musculoskeletal:     Right lower leg: No edema.     Left lower leg: No edema.     Comments: 2+ pedal edema  Skin:    General: Skin is warm.     Capillary Refill: Capillary refill takes less than 2 seconds.  Neurological:     General: No focal deficit present.     Mental Status: She is oriented to person, place, and time. Mental status is at baseline.         Lab Results  Component Value Date   WBC 11.5 (H) 02/27/2022   HGB 10.5 (L) 02/27/2022   HCT 32.5 (L)  02/27/2022   PLT 375 02/27/2022   GLUCOSE 103 (H) 02/27/2022   CHOL 110 02/27/2022   TRIG 77 02/27/2022   HDL 44 02/27/2022   LDLCALC 50 02/27/2022   ALT 17 02/27/2022   AST 17 02/27/2022   NA 142 02/27/2022   K 5.8 (H) 02/27/2022   CL 109 (H) 02/27/2022   CREATININE 2.54 (H) 02/27/2022   BUN 26 02/27/2022   CO2 18 (L) 02/27/2022   HGBA1C 5.6 04/04/2021      Assessment & Plan:   Problem List Items Addressed This Visit       Cardiovascular and Mediastinum   Essential hypertension   Relevant Orders   CBC with Differential/Platelet An individual hypertension care plan was established and reinforced today.  The patient's status was assessed using clinical findings on exam and labs or diagnostic tests. The patient's success at meeting treatment goals on disease specific evidence-based guidelines and found to be good controlled. SELF MANAGEMENT: The patient and I together assessed ways to personally work towards obtaining the recommended goals. RECOMMENDATIONS: avoid decongestants found in common cold remedies, decrease consumption of alcohol, perform routine monitoring of BP with home BP cuff, exercise, reduction of dietary salt, take medicines as prescribed, try not to miss doses and quit smoking.  Regular exercise and maintaining a healthy weight is needed.  Stress reduction may help. A CLINICAL SUMMARY including written plan identify barriers to care unique to individual due to social or financial issues.  We attempt to mutually creat solutions for individual and family understanding.    Other Visit Diagnoses     Acute heart failure  with preserved ejection fraction [facility])    -  Primary   Relevant Orders   Ambulatory referral to Cardiology Patient continue to have 2+ edema and rhonchi in lungs. Recheck BNP.  Restart diuretic and start samples of Entresto for 2 weeks, refer to cardiology ASAP and see [facility]    Acute kidney injury superimposed on CKD [facility])       Relevant  Orders   Comprehensive metabolic panel   Ambulatory referral to Nephrology Worsening renal function with intercurrent CHF.  Watch for over diuresis        .    Orders Placed This Encounter  Procedures   CBC with Differential/Platelet   Comprehensive metabolic panel   Ambulatory referral to Cardiology   Ambulatory referral to Nephrology     Follow-up: Return in about 2 weeks (around 04/10/2022).  An After Visit Summary was printed and given to the patient.  Reinaldo Meeker, MD [facility] [phone]

## 2022-03-28 ENCOUNTER — Telehealth: Payer: Self-pay | Admitting: Nurse Practitioner

## 2022-03-28 DIAGNOSIS — E875 Hyperkalemia: Secondary | ICD-10-CM | POA: Diagnosis not present

## 2022-03-28 DIAGNOSIS — N184 Chronic kidney disease, stage 4 (severe): Secondary | ICD-10-CM | POA: Diagnosis not present

## 2022-03-28 DIAGNOSIS — I129 Hypertensive chronic kidney disease with stage 1 through stage 4 chronic kidney disease, or unspecified chronic kidney disease: Secondary | ICD-10-CM | POA: Diagnosis not present

## 2022-03-28 DIAGNOSIS — N179 Acute kidney failure, unspecified: Secondary | ICD-10-CM | POA: Diagnosis not present

## 2022-03-28 DIAGNOSIS — D631 Anemia in chronic kidney disease: Secondary | ICD-10-CM | POA: Diagnosis not present

## 2022-03-28 DIAGNOSIS — Z79899 Other long term (current) drug therapy: Secondary | ICD-10-CM | POA: Diagnosis not present

## 2022-03-28 LAB — CBC WITH DIFFERENTIAL/PLATELET
Basophils Absolute: 0.1 10*3/uL (ref 0.0–0.2)
Basos: 1 %
EOS (ABSOLUTE): 0.7 10*3/uL — ABNORMAL HIGH (ref 0.0–0.4)
Eos: 5 %
Hematocrit: 33.4 % — ABNORMAL LOW (ref 34.0–46.6)
Hemoglobin: 10.7 g/dL — ABNORMAL LOW (ref 11.1–15.9)
Immature Grans (Abs): 0.2 10*3/uL — ABNORMAL HIGH (ref 0.0–0.1)
Immature Granulocytes: 1 %
Lymphocytes Absolute: 2.1 10*3/uL (ref 0.7–3.1)
Lymphs: 17 %
MCH: 30.1 pg (ref 26.6–33.0)
MCHC: 32 g/dL (ref 31.5–35.7)
MCV: 94 fL (ref 79–97)
Monocytes Absolute: 0.9 10*3/uL (ref 0.1–0.9)
Monocytes: 7 %
Neutrophils Absolute: 8.4 10*3/uL — ABNORMAL HIGH (ref 1.4–7.0)
Neutrophils: 69 %
Platelets: 501 10*3/uL — ABNORMAL HIGH (ref 150–450)
RBC: 3.56 x10E6/uL — ABNORMAL LOW (ref 3.77–5.28)
RDW: 13.1 % (ref 11.7–15.4)
WBC: 12.3 10*3/uL — ABNORMAL HIGH (ref 3.4–10.8)

## 2022-03-28 LAB — COMPREHENSIVE METABOLIC PANEL
ALT: 22 IU/L (ref 0–32)
AST: 23 IU/L (ref 0–40)
Albumin/Globulin Ratio: 1.3 (ref 1.2–2.2)
Albumin: 3.9 g/dL (ref 3.8–4.8)
Alkaline Phosphatase: 96 IU/L (ref 44–121)
BUN/Creatinine Ratio: 29 — ABNORMAL HIGH (ref 12–28)
BUN: 78 mg/dL (ref 8–27)
Bilirubin Total: 0.2 mg/dL (ref 0.0–1.2)
CO2: 17 mmol/L — ABNORMAL LOW (ref 20–29)
Calcium: 6.9 mg/dL — CL (ref 8.7–10.3)
Chloride: 103 mmol/L (ref 96–106)
Creatinine, Ser: 2.72 mg/dL — ABNORMAL HIGH (ref 0.57–1.00)
Globulin, Total: 2.9 g/dL (ref 1.5–4.5)
Glucose: 104 mg/dL — ABNORMAL HIGH (ref 70–99)
Potassium: 6.1 mmol/L (ref 3.5–5.2)
Sodium: 137 mmol/L (ref 134–144)
Total Protein: 6.8 g/dL (ref 6.0–8.5)
eGFR: 17 mL/min/{1.73_m2} — ABNORMAL LOW (ref >59)

## 2022-03-28 LAB — PRO B NATRIURETIC PEPTIDE: NT-Pro BNP: 1313 pg/mL — ABNORMAL HIGH (ref 0–738)

## 2022-03-28 NOTE — Telephone Encounter (Signed)
Telephoned pt concerning potassium 6.1 and BUN 74. Recommended pt go to ED for treatment, pt agreed.

## 2022-03-28 NOTE — Progress Notes (Signed)
BUN increased to 78, eGFR 17 chronic, potassium high 6.1, , low calcium 6.9, ProBNP 1,313 improved, chronic anemia high wbc, has been admitted to hospital lp

## 2022-03-29 DIAGNOSIS — E875 Hyperkalemia: Secondary | ICD-10-CM | POA: Diagnosis not present

## 2022-03-29 DIAGNOSIS — N184 Chronic kidney disease, stage 4 (severe): Secondary | ICD-10-CM | POA: Diagnosis not present

## 2022-03-29 DIAGNOSIS — Z79899 Other long term (current) drug therapy: Secondary | ICD-10-CM | POA: Diagnosis not present

## 2022-04-06 DIAGNOSIS — N2889 Other specified disorders of kidney and ureter: Secondary | ICD-10-CM | POA: Diagnosis not present

## 2022-04-06 DIAGNOSIS — C672 Malignant neoplasm of lateral wall of bladder: Secondary | ICD-10-CM | POA: Diagnosis not present

## 2022-04-10 ENCOUNTER — Encounter: Payer: Self-pay | Admitting: Nurse Practitioner

## 2022-04-10 ENCOUNTER — Ambulatory Visit (INDEPENDENT_AMBULATORY_CARE_PROVIDER_SITE_OTHER): Payer: Medicare Other | Admitting: Nurse Practitioner

## 2022-04-10 VITALS — BP 128/76 | HR 93 | Temp 97.6°F | Ht 63.0 in | Wt 152.0 lb

## 2022-04-10 DIAGNOSIS — I129 Hypertensive chronic kidney disease with stage 1 through stage 4 chronic kidney disease, or unspecified chronic kidney disease: Secondary | ICD-10-CM | POA: Diagnosis not present

## 2022-04-10 DIAGNOSIS — I5033 Acute on chronic diastolic (congestive) heart failure: Secondary | ICD-10-CM

## 2022-04-10 DIAGNOSIS — N184 Chronic kidney disease, stage 4 (severe): Secondary | ICD-10-CM | POA: Diagnosis not present

## 2022-04-10 DIAGNOSIS — G4700 Insomnia, unspecified: Secondary | ICD-10-CM

## 2022-04-10 DIAGNOSIS — C679 Malignant neoplasm of bladder, unspecified: Secondary | ICD-10-CM

## 2022-04-10 LAB — CBC WITH DIFFERENTIAL/PLATELET
Basophils Absolute: 0 10*3/uL (ref 0.0–0.2)
Basos: 0 %
EOS (ABSOLUTE): 0.3 10*3/uL (ref 0.0–0.4)
Eos: 3 %
Hematocrit: 32 % — ABNORMAL LOW (ref 34.0–46.6)
Hemoglobin: 10.4 g/dL — ABNORMAL LOW (ref 11.1–15.9)
Immature Grans (Abs): 0.1 10*3/uL (ref 0.0–0.1)
Immature Granulocytes: 1 %
Lymphocytes Absolute: 2.6 10*3/uL (ref 0.7–3.1)
Lymphs: 24 %
MCH: 29.8 pg (ref 26.6–33.0)
MCHC: 32.5 g/dL (ref 31.5–35.7)
MCV: 92 fL (ref 79–97)
Monocytes Absolute: 0.8 10*3/uL (ref 0.1–0.9)
Monocytes: 7 %
Neutrophils Absolute: 7.2 10*3/uL — ABNORMAL HIGH (ref 1.4–7.0)
Neutrophils: 65 %
Platelets: 405 10*3/uL (ref 150–450)
RBC: 3.49 x10E6/uL — ABNORMAL LOW (ref 3.77–5.28)
RDW: 12.8 % (ref 11.7–15.4)
WBC: 10.9 10*3/uL — ABNORMAL HIGH (ref 3.4–10.8)

## 2022-04-10 LAB — COMPREHENSIVE METABOLIC PANEL
ALT: 16 IU/L (ref 0–32)
AST: 20 IU/L (ref 0–40)
Albumin/Globulin Ratio: 1.5 (ref 1.2–2.2)
Albumin: 3.8 g/dL (ref 3.8–4.8)
Alkaline Phosphatase: 92 IU/L (ref 44–121)
BUN/Creatinine Ratio: 19 (ref 12–28)
BUN: 66 mg/dL — ABNORMAL HIGH (ref 8–27)
Bilirubin Total: 0.4 mg/dL (ref 0.0–1.2)
CO2: 22 mmol/L (ref 20–29)
Calcium: 9 mg/dL (ref 8.7–10.3)
Chloride: 101 mmol/L (ref 96–106)
Creatinine, Ser: 3.52 mg/dL — ABNORMAL HIGH (ref 0.57–1.00)
Globulin, Total: 2.6 g/dL (ref 1.5–4.5)
Glucose: 122 mg/dL — ABNORMAL HIGH (ref 70–99)
Potassium: 4.9 mmol/L (ref 3.5–5.2)
Sodium: 140 mmol/L (ref 134–144)
Total Protein: 6.4 g/dL (ref 6.0–8.5)
eGFR: 13 mL/min/{1.73_m2} — ABNORMAL LOW (ref >59)

## 2022-04-10 MED ORDER — BELSOMRA 10 MG PO TABS: 10.0000 mg | ORAL_TABLET | Freq: Every evening | ORAL | 0 refills | Status: DC | PRN

## 2022-04-10 NOTE — Patient Instructions (Addendum)
Begin Belsomra 10 mg at bedtime as needed for insomnia Decrease caffeine in diet We will call you with lab results Continue medications Follow-up in 4-weeks or sooner if needed  Quality Sleep Information, Adult Quality sleep is important for your mental and physical health. It also improves your quality of life. Quality sleep means you: Are asleep for most of the time you are in bed. Fall asleep within 30 minutes. Wake up no more than once a night. Are awake for no longer than 20 minutes if you do wake up during the night. Most adults need 7-8 hours of quality sleep each night. How can poor sleep affect me? If you do not get enough quality sleep, you may have: Mood swings. Daytime sleepiness. Decreased alertness, reaction time, and concentration. Sleep disorders, such as insomnia and sleep apnea. Difficulty with: Solving problems. Coping with stress. Paying attention. These issues may affect your performance and productivity at work, school, and home. Lack of sleep may also put you at higher risk for accidents, suicide, and risky behaviors. If you do not get quality sleep, you may also be at higher risk for several health problems, including: Infections. Type 2 diabetes. Heart disease. High blood pressure. Obesity. Worsening of long-term conditions, like arthritis, kidney disease, depression, Parkinson's disease, and epilepsy. What actions can I take to get more quality sleep? Sleep schedule and routine Stick to a sleep schedule. Go to sleep and wake up at about the same time each day. Do not try to sleep less on weekdays and make up for lost sleep on weekends. This does not work. Limit naps during the day to 30 minutes or less. Do not take naps in the late afternoon. Make time to relax before bed. Reading, listening to music, or taking a hot bath promotes quality sleep. Make your bedroom a place that promotes quality sleep. Keep your bedroom dark, quiet, and at a comfortable room  temperature. Make sure your bed is comfortable. Avoid using electronic devices that give off bright blue light for 30 minutes before bedtime. Your brain perceives bright blue light as sunlight. This includes television, phones, and computers. If you are lying awake in bed for longer than 20 minutes, get up and do a relaxing activity until you feel sleepy. Lifestyle     Try to get at least 30 minutes of exercise on most days. Do not exercise 2-3 hours before going to bed. Do not use any products that contain nicotine or tobacco. These products include cigarettes, chewing tobacco, and vaping devices, such as e-cigarettes. If you need help quitting, ask your health care provider. Do not drink caffeinated beverages for at least 8 hours before going to bed. Coffee, tea, and some sodas contain caffeine. Do not drink alcohol or eat large meals close to bedtime. Try to get at least 30 minutes of sunlight every day. Morning sunlight is best. Medical concerns Work with your health care provider to treat medical conditions that may affect sleeping, such as: Nasal obstruction. Snoring. Sleep apnea and other sleep disorders. Talk to your health care provider if you think any of your prescription medicines may cause you to have difficulty falling or staying asleep. If you have sleep problems, talk with a sleep consultant. If you think you have a sleep disorder, talk with your health care provider about getting evaluated by a specialist. Where to find more information Sleep Foundation: sleepfoundation.org American Academy of Sleep Medicine: aasm.org Centers for Disease Control and Prevention (CDC): StoreMirror.com.cy Contact a health care  provider if: You have trouble getting to sleep or staying asleep. You often wake up very early in the morning and cannot get back to sleep. You have daytime sleepiness. You have daytime sleep attacks of suddenly falling asleep and sudden muscle weakness (narcolepsy). You have a  tingling sensation in your legs with a strong urge to move your legs (restless legs syndrome). You stop breathing briefly during sleep (sleep apnea). You think you have a sleep disorder or are taking a medicine that is affecting your quality of sleep. Summary Most adults need 7-8 hours of quality sleep each night. Getting enough quality sleep is important for your mental and physical health. Make your bedroom a place that promotes quality sleep, and avoid things that may cause you to have poor sleep, such as alcohol, caffeine, smoking, or large meals. Talk to your health care provider if you have trouble falling asleep or staying asleep. This information is not intended to replace advice given to you by your health care provider. Make sure you discuss any questions you have with your health care provider. Document Revised: 09/20/2021 Document Reviewed: 09/20/2021 Elsevier Patient Education  [place].

## 2022-04-10 NOTE — Progress Notes (Addendum)
Subjective:  Patient ID: Alexandra [name], female    DOB: 11-12-1940  Age: 81 y.o. MRN: [id number]  Chief Complaint  Patient presents with   2 Week Follow-up    HPI   Patient presents today for 2 week follow up on acute heart failure and CKD stg 4. Currently followed by [facility] but has not seen Cardiology. She reports adhering to a strict heart failure diet and daily weighing. She has lost 2 pounds since last visit. Current weight 152 lbs, BMI 26.93. Denies dyspnea or pedal edema. Recently underwent surgery for bladder cancer. States urologist will decide on further radiation and/or chemotherapy.  Current Outpatient Medications on File Prior to Visit  Medication Sig Dispense Refill   atorvastatin (LIPITOR) 40 MG tablet Take 1 tablet (40 mg total) by mouth daily. Per ccm 90 tablet 3   Budeson-Glycopyrrol-Formoterol (BREZTRI AEROSPHERE) 160-9-4.8 MCG/ACT AERO Inhale 2 puffs into the lungs in the morning and at bedtime. 10.7 g 6   eszopiclone (LUNESTA) 2 MG TABS tablet Take 1 tablet (2 mg total) by mouth at bedtime as needed for sleep. Take immediately before bedtime 30 tablet 2   metoprolol succinate (TOPROL-XL) 25 MG 24 hr tablet Take 1 tablet (25 mg total) by mouth daily. 90 tablet 3   rOPINIRole (REQUIP) 2 MG tablet TAKE 1 TABLET(2 MG) BY MOUTH IN THE MORNING AND AT BEDTIME 180 tablet 6   sacubitril-valsartan (ENTRESTO) 24-26 MG Take 1 tablet by mouth 2 (two) times daily. 60 tablet 6   torsemide (DEMADEX) 20 MG tablet Take 1 tablet (20 mg total) by mouth daily. 30 tablet 3   traMADol-acetaminophen (ULTRACET) 37.5-325 MG tablet Take 1 tablet by mouth every 8 (eight) hours as needed. 15 tablet 0   VENTOLIN HFA 108 (90 Base) MCG/ACT inhaler SMARTSIG:1-2 Puff(s) By Mouth Every 4 Hours PRN     No current facility-administered medications on file prior to visit.   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 81 yo   BACK SURGERY  2016   lower L 4 to L 5   bladder tack and uterus lift  10/10/2015   IR RADIOLOGIST EVAL & MGMT  12/03/2018   IR RADIOLOGIST EVAL & MGMT  12/31/2018   PARATHYROIDECTOMY Left 07/02/2016   Procedure: LEFT INFERIOR PARATHYROIDECTOMY;  Surgeon: Armandina Gemma, MD;  Location: [facility];  Service: General;  Laterality: Left;   SHOULDER OPEN ROTATOR CUFF REPAIR  12/2019   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    Family History  Problem Relation Age of Onset   Emphysema Mother    Hyperlipidemia Sister    Hypertension Sister    COPD Sister    Hyperlipidemia Sister    Hypertension Sister    Cancer Sister        lymphoma   Obesity Brother    Hyperlipidemia Brother    Hypertension Brother    Breast cancer Neg Hx    Social History   Socioeconomic History   Marital status: Married    Spouse name: Not on file   Number of children: Not on file   Years of education:  Not on file   Highest education level: Not on file  Occupational History   Not on file  Tobacco Use   Smoking status: Former    Packs/day: 1.00    Years: 50.00    Total pack years: 50.00    Types: Cigarettes    Quit date: 06/11/2005    Years since quitting: 16.8   Smokeless tobacco: Never  Vaping Use   Vaping Use: Never used  Substance and Sexual Activity   Alcohol use: No   Drug use: No   Sexual activity: Yes    Partners: Male  Other Topics Concern   Not on file  Social History Narrative   Not on file   Social Determinants of Health   Financial Resource Strain: Low Risk  (02/15/2022)   Overall Financial Resource Strain (CARDIA)    Difficulty of Paying Living Expenses: Not hard at all  Food Insecurity: No Food  Insecurity (03/27/2022)   Hunger Vital Sign    Worried About Running Out of Food in the Last Year: Never true    Ran Out of Food in the Last Year: Never true  Transportation Needs: No Transportation Needs (03/27/2022)   PRAPARE - Hydrologist (Medical): No    Lack of Transportation (Non-Medical): No  Physical Activity: Inactive (02/15/2022)   Exercise Vital Sign    Days of Exercise per Week: 0 days    Minutes of Exercise per Session: 0 min  Stress: No Stress Concern Present (02/15/2022)   [place]    Feeling of Stress : Only a little  Social Connections: Moderately Integrated (02/15/2022)   Social Connection and Isolation Panel [NHANES]    Frequency of Communication with Friends and Family: More than three times a week    Frequency of Social Gatherings with Friends and Family: More than three times a week    Attends Religious Services: More than 4 times per year    Active Member of [employer] or Organizations: No    Attends Archivist Meetings: Never    Marital Status: Married    Review of Systems  Constitutional:  Positive for fatigue.  Psychiatric/Behavioral:  Positive for sleep disturbance (insomnia).      Objective:  BP 128/76   Pulse 93   Temp 97.6 F (36.4 C)   Ht '5\' 3"'$  (1.6 m)   Wt 152 lb (68.9 kg)   SpO2 98%   BMI 26.93 kg/m       04/10/2022    7:49 AM 03/27/2022    1:50 PM 02/27/2022    7:33 AM  BP/Weight  Systolic BP  629 528  Diastolic BP  70 80  Wt. (Lbs) 152 154 171  BMI 26.93 kg/m2 27.28 kg/m2 30.29 kg/m2    Physical Exam Vitals reviewed.  Constitutional:      Appearance: Normal appearance.  Cardiovascular:     Rate and Rhythm: Normal rate and regular rhythm.     Pulses: Normal pulses.     Heart sounds: Normal heart sounds.  Pulmonary:     Effort: Pulmonary effort is normal.     Breath sounds: Normal breath sounds.  Musculoskeletal:         General: No swelling.     Right lower leg: No edema.     Left lower leg: No edema.  Skin:    General: Skin is warm and dry.  Neurological:     Mental Status: She is alert.  Lab Results  Component Value Date   WBC 12.3 (H) 03/27/2022   HGB 10.7 (L) 03/27/2022   HCT 33.4 (L) 03/27/2022   PLT 501 (H) 03/27/2022   GLUCOSE 104 (H) 03/27/2022   CHOL 110 02/27/2022   TRIG 77 02/27/2022   HDL 44 02/27/2022   LDLCALC 50 02/27/2022   ALT 22 03/27/2022   AST 23 03/27/2022   NA 137 03/27/2022   K 6.1 (HH) 03/27/2022   CL 103 03/27/2022   CREATININE 2.72 (H) 03/27/2022   BUN 78 (HH) 03/27/2022   CO2 17 (L) 03/27/2022   HGBA1C 5.6 04/04/2021      Assessment & Plan:   1. 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 Acute on chronic heart failure with preserved ejection fraction (HCC) - Comprehensive metabolic panel - CBC with Differential/Platelet -continue Entresto 24-26 -continue Torsemide 20 mg QD -continue Metoprolol 25 mg QD -follow-up with cardiologist as scheduled  3. Insomnia, unspecified type - Suvorexant (BELSOMRA) 10 MG TABS; Take 10 mg by mouth at bedtime as needed.  Dispense: 30 tablet; Refill: 0  4. Malignant neoplasm of urinary bladder, unspecified site [facility] - Comprehensive metabolic panel - CBC with Differential/Platelet -follow-up with urologist as scheduled    Begin Belsomra 10 mg at bedtime as needed for insomnia Decrease caffeine in diet We will call you with lab results Continue medications Follow-up in 4-weeks or sooner if needed   Follow-up: 4-weeks, insomnia  An After Visit Summary was printed and given to the patient.  I, Rip Harbour, NP, have reviewed all documentation for this visit. The documentation on 04/10/22 for the exam, diagnosis, procedures, and orders are all accurate and complete.     Signed, Rip Harbour, NP [place] [phone]

## 2022-04-12 ENCOUNTER — Telehealth: Payer: Self-pay

## 2022-04-12 NOTE — Telephone Encounter (Signed)
Called and spoke with clinical staff member with Dr. Cleone Slim office, she stated she had Dr. Posey Pronto review patients most recent labs whom advised that patient did not need to go to the ER however needed to follow up next week with the PA. Spoke with Shaunelle to schedule appt who stated that since patient is Dr. Cleone Slim patient she will need permission from Dr. Hollie Salk for patient to see PA next week. They will contact patient to schedule appt. If they can not reach her they will call our office. Attempted to call patients husband, voicemail was full.

## 2022-04-17 DIAGNOSIS — N390 Urinary tract infection, site not specified: Secondary | ICD-10-CM | POA: Diagnosis not present

## 2022-04-17 DIAGNOSIS — N289 Disorder of kidney and ureter, unspecified: Secondary | ICD-10-CM | POA: Diagnosis not present

## 2022-04-17 DIAGNOSIS — C672 Malignant neoplasm of lateral wall of bladder: Secondary | ICD-10-CM | POA: Diagnosis not present

## 2022-04-17 DIAGNOSIS — N2889 Other specified disorders of kidney and ureter: Secondary | ICD-10-CM | POA: Diagnosis not present

## 2022-04-19 DIAGNOSIS — Z79899 Other long term (current) drug therapy: Secondary | ICD-10-CM | POA: Diagnosis not present

## 2022-04-19 DIAGNOSIS — K219 Gastro-esophageal reflux disease without esophagitis: Secondary | ICD-10-CM | POA: Diagnosis not present

## 2022-04-19 DIAGNOSIS — I110 Hypertensive heart disease with heart failure: Secondary | ICD-10-CM | POA: Diagnosis not present

## 2022-04-19 DIAGNOSIS — J449 Chronic obstructive pulmonary disease, unspecified: Secondary | ICD-10-CM | POA: Diagnosis not present

## 2022-04-19 DIAGNOSIS — E785 Hyperlipidemia, unspecified: Secondary | ICD-10-CM | POA: Diagnosis not present

## 2022-04-19 DIAGNOSIS — I509 Heart failure, unspecified: Secondary | ICD-10-CM | POA: Diagnosis not present

## 2022-04-19 DIAGNOSIS — C672 Malignant neoplasm of lateral wall of bladder: Secondary | ICD-10-CM | POA: Diagnosis not present

## 2022-04-23 NOTE — Progress Notes (Unsigned)
[facility] Initial Visit:  Patient Care Team: Rochel Brome, MD as PCP - General (Internal Medicine) Lane Hacker, [facility] (Pharmacist)  CHIEF COMPLAINTS/PURPOSE OF CONSULTATION:  Oncology History  Bladder cancer [facility])  08/05/2018 Initial Diagnosis   Bladder cancer ([place])   04/24/2022 Cancer Staging   Staging form: Urinary Bladder, AJCC 8th Edition - Clinical stage from 04/24/2022: Stage II (cT2, cN0, cM0) - Signed by Barbee Cough, MD on 04/24/2022 Histopathologic type: Transitional cell carcinoma, NOS Stage prefix: Initial diagnosis WHO/ISUP grade (low/high): High Grade Histologic grading system: 2 grade system   05/08/2022 -  Chemotherapy   Patient is on Treatment Plan : BLADDER Gemcitabine Twice Weekly + XRT       HISTORY OF PRESENTING ILLNESS: Alexandra [name] 81 y.o. female is here because of bladder cancer.   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  Superficial lesions along the right and left lateral walls of bladder.  Biopsy sites were fulgurated  September 30, 2018: Cystoscopy with bladder biopsy.  Small superficial lesions along the left lateral wall next to previous resection.  Biopsied and fulgurated  May 05, 2019 cystoscopy with bladder biopsy.  Small superficial recurrences along previous biopsy sites on lateral walls of bladder noted.  These were fulgurated  November 12, 2019 cystoscopy with bladder biopsy.  Small papillary lesion in area of right lateral wall near previous bladder resection scar..  Pathology demonstrated early noninvasive low-grade papillary urothelial  carcinoma.  Muscularis propria not present  April 27, 2021 cystoscopy with bladder biopsy.  Small papillary lesions in right posterior wall, left lateral wall and anterior wall.  Small papillary fronds at trigone.  Biopsies of the posterior wall of the bladder and bladder neck showed low-grade papillary urothelial carcinoma noninvasive   February 15, 2022: CT abdomen pelvis without contrast.  Previous left nephrectomy noted.  Mild right hydronephrosis to the level of bladder new from previous study.  Focal area of bladder wall thickening in the region of the right ureterovesicular junction raising suspicious for bladder cancer  March 15, 2022: Cystoscopy demonstrated right ureteral obstruction with hydronephrosis.  Underwent right retrograde pyelogram, dilation of ureteral stricture stent placement and TUR BT.   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  Trivial MR and TR.    March 28 2022 CMP notable for creatinine of 2.80 glucose 101 estimated GFR 16 calcium 7.2 albumin 3.1 Alk Phos 86  April 24 2022:  [facility] Medical Oncology Consult  Social:  Retired. Factory work.  Married.  Began smoking cigarettes 20 yrs of age, 1 ppd and quit 24 yrs ago.  EtOH none  [place] Mother died 33 old age Father killed when patient was 37 yrs of age Brother died 23 MI Sister alive 43 several medical problems Sister alive 66 thyroid problems.     Review of Systems  Constitutional:  Positive for appetite change and unexpected weight change. Negative for chills, fatigue  and fever.       Has bouts of nausea since cholecystectomy which interferes with appetite.  Has lost 25 lbs in part due to  diuresis of edema  HENT:   Negative for hearing loss, mouth sores, nosebleeds, sore throat and trouble swallowing.   Eyes:  Negative for eye problems and icterus.       Vision changes:  None  Respiratory:  Negative for chest tightness, cough, hemoptysis, shortness of breath and wheezing.         Orthopnea:  Sleeps in lounge chair DOE:  walking up stairs  Cardiovascular:  Negative for chest pain, leg swelling and palpitations.       PND:  none Orthopnea:  none Leg swelling resolved with diuresis  Gastrointestinal:  Positive for nausea. Negative for abdominal pain, blood in stool, constipation, diarrhea and vomiting.  Endocrine:       Cold intolerance:  none Heat intolerance:  none  Genitourinary:  Positive for nocturia. Negative for difficulty urinating, dysuria, frequency and hematuria.        Nocturia x 3 to 4  Musculoskeletal:  Negative for arthralgias, back pain, gait problem and myalgias.  Skin:  Negative for itching, rash and wound.  Neurological:  Negative for dizziness, extremity weakness, gait problem, headaches, light-headedness, numbness and speech difficulty.  Hematological:  Negative for adenopathy. Does not bruise/bleed easily.  Psychiatric/Behavioral:  Negative for suicidal ideas. The patient is not nervous/anxious.        Tosses and turns at night    MEDICAL HISTORY: Past Medical History:  Diagnosis Date   COPD (chronic obstructive pulmonary disease) (HCC)    GERD (gastroesophageal reflux disease)    Hyperlipidemia    Hyperparathyroidism, primary ([place]) 06/25/2016   Hypertension    Osteoporosis    Restless legs syndrome    RLS (restless legs syndrome)     SURGICAL HISTORY: Past Surgical History:  Procedure Laterality Date   ABDOMINAL HYSTERECTOMY     complete: menorrhagia. 81 yo   BACK SURGERY  2016   lower L 4 to L 5   bladder tack and uterus lift  10/10/2015   IR RADIOLOGIST EVAL & MGMT  12/03/2018   IR RADIOLOGIST EVAL & MGMT  12/31/2018    PARATHYROIDECTOMY Left 07/02/2016   Procedure: LEFT INFERIOR PARATHYROIDECTOMY;  Surgeon: Armandina Gemma, MD;  Location: [facility];  Service: General;  Laterality: Left;   SHOULDER OPEN ROTATOR CUFF REPAIR  12/2019   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    SOCIAL HISTORY: Social History   Socioeconomic History   Marital status: Married    Spouse name: Herbie [place]   Number of children: 2   Years of education: 11   Highest education level: 11th grade  Occupational History   Occupation: Retired  Tobacco Use   Smoking status: Former    Packs/day: 1.00    Years: 50.00    Total pack years: 50.00    Types: Cigarettes    Quit date: 06/11/2005    Years since quitting: 16.8   Smokeless tobacco: Never  Vaping Use   Vaping Use: Never used  Substance and Sexual Activity   Alcohol use: No   Drug use: No   Sexual activity: Yes    Partners: Male  Other Topics Concern   Not on file  Social History Narrative   Not on file   Social Determinants of Health   Financial Resource Strain: Low Risk  (02/15/2022)   Overall Financial Resource Strain (CARDIA)  Difficulty of Paying Living Expenses: Not hard at all  Food Insecurity: No Food Insecurity (03/27/2022)   Hunger Vital Sign    Worried About Running Out of Food in the Last Year: Never true    Ran Out of Food in the Last Year: Never true  Transportation Needs: No Transportation Needs (03/27/2022)   PRAPARE - Hydrologist (Medical): No    Lack of Transportation (Non-Medical): No  Physical Activity: Inactive (02/15/2022)   Exercise Vital Sign    Days of Exercise per Week: 0 days    Minutes of Exercise per Session: 0 min  Stress: No Stress Concern Present (02/15/2022)   [place]    Feeling of Stress : Only a little  Social Connections: Moderately Integrated (02/15/2022)   Social Connection and Isolation Panel [NHANES]    Frequency of Communication  with Friends and Family: More than three times a week    Frequency of Social Gatherings with Friends and Family: More than three times a week    Attends Religious Services: More than 4 times per year    Active Member of [employer] or Organizations: No    Attends Archivist Meetings: Never    Marital Status: Married  Human resources officer Violence: Not At Risk (02/15/2022)   Humiliation, Afraid, Rape, and Kick questionnaire    Fear of Current or Ex-Partner: No    Emotionally Abused: No    Physically Abused: No    Sexually Abused: No    FAMILY HISTORY Family History  Problem Relation Age of Onset   Emphysema Mother    Hyperlipidemia Sister    Hypertension Sister    COPD Sister    Hyperlipidemia Sister    Hypertension Sister    Cancer Sister        lymphoma   Obesity Brother    Hyperlipidemia Brother    Hypertension Brother    Breast cancer Neg Hx     ALLERGIES:  is allergic to contrast media [iodinated contrast media], omeprazole, shellfish allergy, shellfish-derived products, and zofran [ondansetron hcl].  MEDICATIONS:  Current Outpatient Medications  Medication Sig Dispense Refill   atorvastatin (LIPITOR) 40 MG tablet Take 1 tablet (40 mg total) by mouth daily. Per ccm 90 tablet 3   metoprolol succinate (TOPROL-XL) 25 MG 24 hr tablet Take 25 mg by mouth daily.     rOPINIRole (REQUIP) 2 MG tablet TAKE 1 TABLET(2 MG) BY MOUTH IN THE MORNING AND AT BEDTIME 180 tablet 6   sacubitril-valsartan (ENTRESTO) 24-26 MG Take 1 tablet by mouth 2 (two) times daily. 60 tablet 6   sodium bicarbonate 650 MG tablet Take 650 mg by mouth 3 (three) times daily.     torsemide (DEMADEX) 20 MG tablet Take 1 tablet (20 mg total) by mouth daily. 30 tablet 3   VENTOLIN HFA 108 (90 Base) MCG/ACT inhaler SMARTSIG:1-2 Puff(s) By Mouth Every 4 Hours PRN     Budeson-Glycopyrrol-Formoterol (BREZTRI AEROSPHERE) 160-9-4.8 MCG/ACT AERO Inhale 2 puffs into the lungs in the morning and at bedtime. (Patient not  taking: Reported on 04/24/2022) 10.7 g 6   No current facility-administered medications for this visit.    PHYSICAL EXAMINATION:  ECOG PERFORMANCE STATUS: 1 - Symptomatic but completely ambulatory   Vitals:   04/24/22 1022  BP: 138/65  Pulse: 98  Resp: 20  Temp: 97.9 F (36.6 C)  SpO2: 98%    Filed Weights   04/24/22 1022  Weight: 149 lb  12.8 oz (67.9 kg)     Physical Exam Vitals and nursing note reviewed.  Constitutional:      General: She is not in acute distress.    Appearance: Normal appearance. She is normal weight. She is not ill-appearing, toxic-appearing or diaphoretic.     Comments: Here with daughter.  Elderly but looks younger than age  HENT:     Head: Normocephalic and atraumatic.     Right Ear: External ear normal.     Left Ear: External ear normal.     Nose: Nose normal. No congestion or rhinorrhea.  Eyes:     General: No scleral icterus.    Extraocular Movements: Extraocular movements intact.     Conjunctiva/sclera: Conjunctivae normal.     Pupils: Pupils are equal, round, and reactive to light.  Cardiovascular:     Rate and Rhythm: Normal rate and regular rhythm.     Heart sounds: No murmur heard.    No friction rub. No gallop.  Pulmonary:     Effort: Pulmonary effort is normal. No respiratory distress.     Breath sounds: Normal breath sounds. No stridor. No wheezing, rhonchi or rales.  Abdominal:     General: Bowel sounds are normal.     Palpations: Abdomen is soft.     Tenderness: There is no abdominal tenderness. There is no guarding or rebound.  Musculoskeletal:        General: No swelling, tenderness or deformity.     Cervical back: Normal range of motion and neck supple. No rigidity or tenderness.     Right lower leg: Edema present.     Left lower leg: Edema present.  Lymphadenopathy:     Head:     Right side of head: No submental, submandibular, tonsillar, preauricular, posterior auricular or occipital adenopathy.     Left side of  head: No submental, submandibular, tonsillar, preauricular, posterior auricular or occipital adenopathy.     Cervical: No cervical adenopathy.     Right cervical: No superficial, deep or posterior cervical adenopathy.    Left cervical: No superficial, deep or posterior cervical adenopathy.     Upper Body:     Right upper body: No supraclavicular, axillary, pectoral or epitrochlear adenopathy.     Left upper body: No supraclavicular, axillary, pectoral or epitrochlear adenopathy.  Skin:    General: Skin is warm.     Coloration: Skin is not jaundiced or pale.     Findings: No bruising or erythema.  Neurological:     General: No focal deficit present.     Mental Status: She is alert and oriented to person, place, and time.     Cranial Nerves: No cranial nerve deficit.     Gait: Gait normal.  Psychiatric:        Mood and Affect: Mood normal.        Behavior: Behavior normal.        Thought Content: Thought content normal.        Judgment: Judgment normal.      LABORATORY DATA: I have personally reviewed the data as listed:  Office Visit on 04/10/2022  Component Date Value Ref Range Status   Glucose 04/10/2022 122 (H)  70 - 99 mg/dL Final   BUN 04/10/2022 66 (H)  8 - 27 mg/dL Final   Creatinine, Ser 04/10/2022 3.52 (H)  0.57 - 1.00 mg/dL Final   eGFR 04/10/2022 13 (L)  >59 mL/min/1.73 Final   BUN/Creatinine Ratio 04/10/2022 19  12 - 28 Final  Sodium 04/10/2022 140  134 - 144 mmol/L Final   Potassium 04/10/2022 4.9  3.5 - 5.2 mmol/L Final   Chloride 04/10/2022 101  96 - 106 mmol/L Final   CO2 04/10/2022 22  20 - 29 mmol/L Final   Calcium 04/10/2022 9.0  8.7 - 10.3 mg/dL Final   Total Protein 04/10/2022 6.4  6.0 - 8.5 g/dL Final   Albumin 04/10/2022 3.8  3.8 - 4.8 g/dL Final   Globulin, Total 04/10/2022 2.6  1.5 - 4.5 g/dL Final   Albumin/Globulin Ratio 04/10/2022 1.5  1.2 - 2.2 Final   Bilirubin Total 04/10/2022 0.4  0.0 - 1.2 mg/dL Final   Alkaline Phosphatase 04/10/2022 92   44 - 121 IU/L Final   AST 04/10/2022 20  0 - 40 IU/L Final   ALT 04/10/2022 16  0 - 32 IU/L Final   WBC 04/10/2022 10.9 (H)  3.4 - 10.8 x10E3/uL Final   RBC 04/10/2022 3.49 (L)  3.77 - 5.28 x10E6/uL Final   Hemoglobin 04/10/2022 10.4 (L)  11.1 - 15.9 g/dL Final   Hematocrit 04/10/2022 32.0 (L)  34.0 - 46.6 % Final   MCV 04/10/2022 92  79 - 97 fL Final   MCH 04/10/2022 29.8  26.6 - 33.0 pg Final   MCHC 04/10/2022 32.5  31.5 - 35.7 g/dL Final   RDW 04/10/2022 12.8  11.7 - 15.4 % Final   Platelets 04/10/2022 405  150 - 450 x10E3/uL Final   Neutrophils 04/10/2022 65  Not Estab. % Final   Lymphs 04/10/2022 24  Not Estab. % Final   Monocytes 04/10/2022 7  Not Estab. % Final   Eos 04/10/2022 3  Not Estab. % Final   Basos 04/10/2022 0  Not Estab. % Final   Neutrophils Absolute 04/10/2022 7.2 (H)  1.4 - 7.0 x10E3/uL Final   Lymphocytes Absolute 04/10/2022 2.6  0.7 - 3.1 x10E3/uL Final   Monocytes Absolute 04/10/2022 0.8  0.1 - 0.9 x10E3/uL Final   EOS (ABSOLUTE) 04/10/2022 0.3  0.0 - 0.4 x10E3/uL Final   Basophils Absolute 04/10/2022 0.0  0.0 - 0.2 x10E3/uL Final   Immature Granulocytes 04/10/2022 1  Not Estab. % Final   Immature Grans (Abs) 04/10/2022 0.1  0.0 - 0.1 x10E3/uL Final  Office Visit on 03/27/2022  Component Date Value Ref Range Status   WBC 03/27/2022 12.3 (H)  3.4 - 10.8 x10E3/uL Final   RBC 03/27/2022 3.56 (L)  3.77 - 5.28 x10E6/uL Final   Hemoglobin 03/27/2022 10.7 (L)  11.1 - 15.9 g/dL Final   Hematocrit 03/27/2022 33.4 (L)  34.0 - 46.6 % Final   MCV 03/27/2022 94  79 - 97 fL Final   MCH 03/27/2022 30.1  26.6 - 33.0 pg Final   MCHC 03/27/2022 32.0  31.5 - 35.7 g/dL Final   RDW 03/27/2022 13.1  11.7 - 15.4 % Final   Platelets 03/27/2022 501 (H)  150 - 450 x10E3/uL Final   Neutrophils 03/27/2022 69  Not Estab. % Final   Lymphs 03/27/2022 17  Not Estab. % Final   Monocytes 03/27/2022 7  Not Estab. % Final   Eos 03/27/2022 5  Not Estab. % Final   Basos 03/27/2022 1  Not  Estab. % Final   Neutrophils Absolute 03/27/2022 8.4 (H)  1.4 - 7.0 x10E3/uL Final   Lymphocytes Absolute 03/27/2022 2.1  0.7 - 3.1 x10E3/uL Final   Monocytes Absolute 03/27/2022 0.9  0.1 - 0.9 x10E3/uL Final   EOS (ABSOLUTE) 03/27/2022 0.7 (H)  0.0 - 0.4 x10E3/uL  Final   Basophils Absolute 03/27/2022 0.1  0.0 - 0.2 x10E3/uL Final   Immature Granulocytes 03/27/2022 1  Not Estab. % Final   Immature Grans (Abs) 03/27/2022 0.2 (H)  0.0 - 0.1 x10E3/uL Final   Comment: (An elevated percentage of Immature Granulocytes has not been found to be clinically significant as a sole clinical predictor of disease. Does NOT include bands or blast cells.  Pregnancy associated physiological leukocytosis may also show increased immature granulocytes without clinical significance.)    Glucose 03/27/2022 104 (H)  70 - 99 mg/dL Final   BUN 03/27/2022 78 (HH)  8 - 27 mg/dL Final   Creatinine, Ser 03/27/2022 2.72 (H)  0.57 - 1.00 mg/dL Final   eGFR 03/27/2022 17 (L)  >59 mL/min/1.73 Final   BUN/Creatinine Ratio 03/27/2022 29 (H)  12 - 28 Final   Sodium 03/27/2022 137  134 - 144 mmol/L Final   Potassium 03/27/2022 6.1 (HH)  3.5 - 5.2 mmol/L Final                     Client Requested Flag   Chloride 03/27/2022 103  96 - 106 mmol/L Final   CO2 03/27/2022 17 (L)  20 - 29 mmol/L Final   Calcium 03/27/2022 6.9 (LL)  8.7 - 10.3 mg/dL Final   **Verified by repeat analysis**   Total Protein 03/27/2022 6.8  6.0 - 8.5 g/dL Final   Albumin 03/27/2022 3.9  3.8 - 4.8 g/dL Final   Globulin, Total 03/27/2022 2.9  1.5 - 4.5 g/dL Final   Albumin/Globulin Ratio 03/27/2022 1.3  1.2 - 2.2 Final   Bilirubin Total 03/27/2022 0.2  0.0 - 1.2 mg/dL Final   Alkaline Phosphatase 03/27/2022 96  44 - 121 IU/L Final   AST 03/27/2022 23  0 - 40 IU/L Final   ALT 03/27/2022 22  0 - 32 IU/L Final   NT-Pro BNP 03/27/2022 1,313 (H)  0 - 738 pg/mL Final   Comment: The following cut-points have been suggested for the use of proBNP for the  diagnostic evaluation of heart failure (HF) in patients with acute dyspnea: Modality                     Age           Optimal Cut                            (years)            Point ------------------------------------------------------ Diagnosis (rule in HF)        <50            450 pg/mL                           50 - 75            900 pg/mL                               >75           1800 pg/mL Exclusion (rule out HF)  Age independent     300 pg/mL     RADIOGRAPHIC STUDIES: I have personally reviewed the radiological images as listed and agree with the findings in the report  No results found.  ASSESSMENT/PLAN  81 y.o. female with medical history notable for COPD,  GERD, hyperlipidemia, hyperparathyroidism, hypertension, osteoporosis, restless leg syndrome for chronic kidney disease, CT contrast allergy, gout, CKD, congestive heart failure.  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  Focal area of bladder wall thickening in the region of the right ureterovesicular junction raising suspicious for bladder cancer  October 2023:  Cystoscopy. Right ureteral obstruction with hydronephrosis.   Stent placed.  Biopsied.     Will complete staging with uncontrasted CT chest  Major risk factor is long history of tobacco use  Renal cell carcinoma, Left kidney:  Treated with nephrectomy in 2003  Chronic Kidney Disease:  Multifactorial with largest component being nephrectomy.  Contributing factors are HTN, age, CHF.    Therapeutics:  Dictated by age,  comorbidities especially CKD which excludes consideration of cystectomy and platinum.  Has been evaluated by Radiation Oncology.  She is a reasonable candidate for XRT + twice weekly low dose Gemcitabine.    Chemotherapy Risks:  Discussed the potential side effects of chemotherapy with the patient.  These include but are not limited to:  Fatigue, Hair loss, low blood counts (anemia, thrombocytopenia) that may necessitate transfusion, bleeding, infection, nausea/ vomiting/Appetite changes/ Constipation/ Diarrhea, mucositis, Neuropathy/ neurologic problems, skin and nail changes such as dry skin and color change, Urine and bladder changes and kidney problems, weight changes, mood changes, decreased libido/fertility problems, damage to heart and lungs.  Some of these side effects can be life threatening, may be permanent and can result in hospitalization and/or death.  In shared decision making patient has agreed to proceed with chemotherapy.  Will refer patient for formal chemotherapy teaching.    Poor venous access:  In preparation for chemotherapy we will arrange for port-a-cath placement.  We discussed benefits (more efficient access to administer chemotherapy and supportive care medications/IVF, imaging contrast, decreased risk of tissue damage from medications) and disadvantages (risks of infections, thrombosis, surgical procedure, periodic flushing).  Patient has elected to proceed with port placement.      History of kidney and bladder cancer:  Refer to genetics.    Cancer Staging  Bladder cancer [facility]) Staging form: Urinary Bladder, AJCC 8th Edition - Clinical stage from 04/24/2022: Stage II (cT2, cN0, cM0) - Signed by Barbee Cough, MD on 04/24/2022 Histopathologic type: Transitional cell carcinoma, NOS Stage prefix: Initial diagnosis WHO/ISUP grade (low/high): High Grade Histologic grading system: 2 grade system    No problem-specific Assessment & Plan notes found for this  encounter.   Orders Placed This Encounter  Procedures   CT Chest Wo Contrast    Standing Status:   Future    Standing Expiration Date:   04/24/2023    Order Specific Question:   Preferred imaging location?    Answer:   External   CBC with Differential (Cancer Center Only)    Standing Status:   Future    Standing Expiration Date:   05/09/2023   CMP ([place] only)    Standing Status:   Future    Standing Expiration Date:   05/09/2023   CBC with Differential ([place])    Standing  Status:   Future    Standing Expiration Date:   05/16/2023   CMP ([place] only)    Standing Status:   Future    Standing Expiration Date:   05/16/2023   CBC with Differential ([place] Only)    Standing Status:   Future    Standing Expiration Date:   05/23/2023   CMP ([place] only)    Standing Status:   Future    Standing Expiration Date:   05/23/2023   CBC with Differential (Cancer Center Only)    Standing Status:   Future    Standing Expiration Date:   05/30/2023   CMP ([place] only)    Standing Status:   Future    Standing Expiration Date:   05/30/2023   CBC with Differential (Cancer Center Only)    Standing Status:   Future    Standing Expiration Date:   06/06/2023   CMP ([place] only)    Standing Status:   Future    Standing Expiration Date:   06/06/2023   CBC with Differential ([place] Only)    Standing Status:   Future    Standing Expiration Date:   06/13/2023   CMP ([place] only)    Standing Status:   Future    Standing Expiration Date:   06/13/2023   CBC with Differential (Cancer Center Only)    Standing Status:   Future    Standing Expiration Date:   06/20/2023   CMP ([place] only)    Standing Status:   Future    Standing Expiration Date:   06/20/2023   Ambulatory referral to General Surgery    Referral Priority:   Routine    Referral Type:   Surgical    Referral Reason:   Specialty Services Required    Requested Specialty:    General Surgery    Number of Visits Requested:   1   Ambulatory referral to Genetics    Referral Priority:   Routine    Referral Type:   Consultation    Referral Reason:   Specialty Services Required    Number of Visits Requested:   1    All questions were answered. The patient knows to call the clinic with any problems, questions or concerns.  This note was electronically signed.    Barbee Cough, MD  04/24/2022 11:11 AM

## 2022-04-24 ENCOUNTER — Other Ambulatory Visit: Payer: Self-pay | Admitting: Pharmacist

## 2022-04-24 ENCOUNTER — Other Ambulatory Visit: Payer: Self-pay

## 2022-04-24 ENCOUNTER — Encounter: Payer: Self-pay | Admitting: Oncology

## 2022-04-24 ENCOUNTER — Inpatient Hospital Stay: Payer: Medicare Other | Attending: Oncology | Admitting: Oncology

## 2022-04-24 ENCOUNTER — Inpatient Hospital Stay: Payer: Medicare Other

## 2022-04-24 VITALS — BP 138/65 | HR 98 | Temp 97.9°F | Resp 20 | Ht 61.7 in | Wt 149.8 lb

## 2022-04-24 DIAGNOSIS — C679 Malignant neoplasm of bladder, unspecified: Secondary | ICD-10-CM | POA: Diagnosis not present

## 2022-04-24 DIAGNOSIS — M810 Age-related osteoporosis without current pathological fracture: Secondary | ICD-10-CM | POA: Insufficient documentation

## 2022-04-24 DIAGNOSIS — C672 Malignant neoplasm of lateral wall of bladder: Secondary | ICD-10-CM | POA: Diagnosis not present

## 2022-04-24 DIAGNOSIS — N1832 Chronic kidney disease, stage 3b: Secondary | ICD-10-CM

## 2022-04-24 DIAGNOSIS — E785 Hyperlipidemia, unspecified: Secondary | ICD-10-CM | POA: Diagnosis not present

## 2022-04-24 DIAGNOSIS — Z510 Encounter for antineoplastic radiation therapy: Secondary | ICD-10-CM | POA: Diagnosis not present

## 2022-04-24 DIAGNOSIS — Z905 Acquired absence of kidney: Secondary | ICD-10-CM

## 2022-04-24 DIAGNOSIS — J449 Chronic obstructive pulmonary disease, unspecified: Secondary | ICD-10-CM | POA: Diagnosis not present

## 2022-04-24 DIAGNOSIS — Z862 Personal history of diseases of the blood and blood-forming organs and certain disorders involving the immune mechanism: Secondary | ICD-10-CM | POA: Insufficient documentation

## 2022-04-24 DIAGNOSIS — C678 Malignant neoplasm of overlapping sites of bladder: Secondary | ICD-10-CM | POA: Insufficient documentation

## 2022-04-24 DIAGNOSIS — I509 Heart failure, unspecified: Secondary | ICD-10-CM | POA: Insufficient documentation

## 2022-04-24 DIAGNOSIS — Z85528 Personal history of other malignant neoplasm of kidney: Secondary | ICD-10-CM | POA: Insufficient documentation

## 2022-04-24 DIAGNOSIS — K219 Gastro-esophageal reflux disease without esophagitis: Secondary | ICD-10-CM | POA: Diagnosis not present

## 2022-04-24 DIAGNOSIS — Z7189 Other specified counseling: Secondary | ICD-10-CM | POA: Diagnosis not present

## 2022-04-24 DIAGNOSIS — I130 Hypertensive heart and chronic kidney disease with heart failure and stage 1 through stage 4 chronic kidney disease, or unspecified chronic kidney disease: Secondary | ICD-10-CM | POA: Insufficient documentation

## 2022-04-24 DIAGNOSIS — I110 Hypertensive heart disease with heart failure: Secondary | ICD-10-CM | POA: Diagnosis not present

## 2022-04-24 DIAGNOSIS — N189 Chronic kidney disease, unspecified: Secondary | ICD-10-CM | POA: Insufficient documentation

## 2022-04-24 DIAGNOSIS — Z79899 Other long term (current) drug therapy: Secondary | ICD-10-CM | POA: Diagnosis not present

## 2022-04-24 DIAGNOSIS — Z5111 Encounter for antineoplastic chemotherapy: Secondary | ICD-10-CM | POA: Insufficient documentation

## 2022-04-24 NOTE — Progress Notes (Signed)
START ON PATHWAY REGIMEN - Bladder   Gemcitabine 27 mg/m2 IV Twice Weekly + RT x 4 Weeks (Induction):   One induction cycle is 4 weeks:     Gemcitabine    Gemcitabine 27 mg/m2 IV Twice Weekly + RT x 2.5 Weeks (Consolidation):   One consolidation cycle is 2.5 weeks:     Gemcitabine   **Always confirm dose/schedule in your pharmacy ordering system**  Patient Characteristics: Pre-Cystectomy or Nonsurgical Candidate, M0 (Clinical Staging), cT2-4a, cN0-1, M0, Bladder-Sparing Approach Therapeutic Status: Pre-Cystectomy or Nonsurgical Candidate, M0 (Clinical Staging) AJCC M Category: cM0 AJCC 8 Stage Grouping: II AJCC T Category: cT2 AJCC N Category: cN0 Intent of Therapy: Curative Intent, Discussed with Patient

## 2022-04-25 ENCOUNTER — Other Ambulatory Visit: Payer: Self-pay

## 2022-04-25 ENCOUNTER — Inpatient Hospital Stay (INDEPENDENT_AMBULATORY_CARE_PROVIDER_SITE_OTHER): Payer: Medicare Other | Admitting: Hematology and Oncology

## 2022-04-25 ENCOUNTER — Encounter: Payer: Self-pay | Admitting: Hematology and Oncology

## 2022-04-25 VITALS — BP 133/60 | HR 88 | Temp 98.1°F | Resp 18 | Ht 61.7 in | Wt 149.8 lb

## 2022-04-25 DIAGNOSIS — C678 Malignant neoplasm of overlapping sites of bladder: Secondary | ICD-10-CM

## 2022-04-25 MED ORDER — PROCHLORPERAZINE MALEATE 10 MG PO TABS: 10.0000 mg | ORAL_TABLET | Freq: Four times a day (QID) | ORAL | 1 refills | Status: DC | PRN

## 2022-04-25 NOTE — Progress Notes (Signed)
[facility] [phone]   [phone]   Patient Care Team: Rochel Brome, MD as PCP - General (Internal Medicine) Lane Hacker, [facility] (Pharmacist)   Name of the patient: Alexandra [name]  [id number]  08/30/1940   Date of visit: 04/25/22  Diagnosis-  1. Malignant neoplasm of overlapping sites of bladder [facility])     Chief complaint/Reason for visit- Initial Meeting for [facility], preparing for starting chemotherapy  Heme/Onc history:  Oncology History  Bladder cancer ([place])  08/05/2018 Initial Diagnosis   Bladder cancer ([place])   04/24/2022 Cancer Staging   Staging form: Urinary Bladder, AJCC 8th Edition - Clinical stage from 04/24/2022: Stage II (cT2, cN0, cM0) - Signed by Barbee Cough, MD on 04/24/2022 Histopathologic type: Transitional cell carcinoma, NOS Stage prefix: Initial diagnosis WHO/ISUP grade (low/high): High Grade Histologic grading system: 2 grade system   05/08/2022 -  Chemotherapy   Patient is on Treatment Plan : BLADDER Gemcitabine Twice Weekly + XRT       Interval history-  The patient presents to chemo care clinic today for initial meeting in preparation for starting chemotherapy. I introduced the chemo care clinic and we discussed that the role of the clinic is to assist those who are at an increased risk of emergency room visits and/or complications during the course of chemotherapy treatment. We discussed that the increased risk takes into account factors such as age, performance status, and co-morbidities. We also discussed that for some, this might include barriers to care such as not having a primary care provider, lack of insurance/transportation, or not being able to afford medications. We discussed that the goal of the program is to help prevent unplanned ER visits and help reduce complications during chemotherapy. We do this by discussing specific risk factors to each  individual and identifying ways that we can help improve these risk factors and reduce barriers to care.   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 81 yo   BACK SURGERY  2016   lower L 4 to L 5   bladder tack and uterus lift  10/10/2015   CHOLECYSTECTOMY     summer of 2022   IR RADIOLOGIST EVAL & MGMT  12/03/2018   IR RADIOLOGIST EVAL & MGMT  12/31/2018   PARATHYROIDECTOMY Left 07/02/2016   Procedure: LEFT INFERIOR PARATHYROIDECTOMY;  Surgeon: Armandina Gemma, MD;  Location: [facility];  Service: General;  Laterality: Left;   SHOULDER OPEN ROTATOR CUFF REPAIR  12/2019   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    Social History   Socioeconomic History   Marital status: Married    Spouse name: Herbie [place]   Number of children: 2   Years of education: 11   Highest education level: 11th grade  Occupational History   Occupation: Retired  Tobacco Use  Smoking status: Former    Packs/day: 1.00    Years: 50.00    Total pack years: 50.00    Types: Cigarettes    Quit date: 06/11/2005    Years since quitting: 16.8   Smokeless tobacco: Never  Vaping Use   Vaping Use: Never used  Substance and Sexual Activity   Alcohol use: No   Drug use: No   Sexual activity: Yes    Partners: Male  Other Topics Concern   Not on file  Social History Narrative   Not on file   Social Determinants  of Health   Financial Resource Strain: Low Risk  (02/15/2022)   Overall Financial Resource Strain (CARDIA)    Difficulty of Paying Living Expenses: Not hard at all  Food Insecurity: No Food Insecurity (03/27/2022)   Hunger Vital Sign    Worried About Running Out of Food in the Last Year: Never true    Ran Out of Food in the Last Year: Never true  Transportation Needs: No Transportation Needs (03/27/2022)   PRAPARE - Hydrologist (Medical): No    Lack of Transportation (Non-Medical): No  Physical Activity: Inactive (02/15/2022)   Exercise Vital Sign    Days of Exercise per Week: 0 days    Minutes of Exercise per Session: 0 min  Stress: No Stress Concern Present (02/15/2022)   [place]    Feeling of Stress : Only a little  Social Connections: Moderately Integrated (02/15/2022)   Social Connection and Isolation Panel [NHANES]    Frequency of Communication with Friends and Family: More than three times a week    Frequency of Social Gatherings with Friends and Family: More than three times a week    Attends Religious Services: More than 4 times per year    Active Member of [employer] or Organizations: No    Attends Archivist Meetings: Never    Marital Status: Married  Human resources officer Violence: Not At Risk (02/15/2022)   Humiliation, Afraid, Rape, and Kick questionnaire    Fear of Current or Ex-Partner: No    Emotionally Abused: No    Physically Abused: No    Sexually Abused: No    Family History  Problem Relation Age of Onset   Emphysema Mother    Hyperlipidemia Sister    Hypertension Sister    COPD Sister    Hyperlipidemia Sister    Hypertension Sister    Cancer Sister        lymphoma   Obesity Brother    Hyperlipidemia Brother    Hypertension Brother    Breast cancer Neg Hx      Current Outpatient Medications:    Budeson-Glycopyrrol-Formoterol (BREZTRI AEROSPHERE) 160-9-4.8  MCG/ACT AERO, Inhale 2 puffs into the lungs in the morning and at bedtime., Disp: 10.7 g, Rfl: 6   atorvastatin (LIPITOR) 40 MG tablet, Take 1 tablet (40 mg total) by mouth daily. Per ccm, Disp: 90 tablet, Rfl: 3   metoprolol succinate (TOPROL-XL) 25 MG 24 hr tablet, Take 25 mg by mouth daily., Disp: , Rfl:    prochlorperazine (COMPAZINE) 10 MG tablet, Take 1 tablet (10 mg total) by mouth every 6 (six) hours as needed for nausea or vomiting., Disp: 30 tablet, Rfl: 1   rOPINIRole (REQUIP) 2 MG tablet, TAKE 1 TABLET(2 MG) BY MOUTH IN THE MORNING AND AT BEDTIME, Disp: 180 tablet, Rfl: 6   sacubitril-valsartan (ENTRESTO) 24-26 MG, Take 1 tablet by mouth  2 (two) times daily., Disp: 60 tablet, Rfl: 6   sodium bicarbonate 650 MG tablet, Take 650 mg by mouth 3 (three) times daily., Disp: , Rfl:    torsemide (DEMADEX) 20 MG tablet, Take 1 tablet (20 mg total) by mouth daily., Disp: 30 tablet, Rfl: 3   VENTOLIN HFA 108 (90 Base) MCG/ACT inhaler, SMARTSIG:1-2 Puff(s) By Mouth Every 4 Hours PRN (Patient not taking: Reported on 04/25/2022), Disp: , Rfl:      Latest Ref Rng & Units 04/10/2022    8:22 AM  CMP  Glucose 70 - 99 mg/dL 122   BUN 8 - 27 mg/dL 66   Creatinine 0.57 - 1.00 mg/dL 3.52   Sodium 134 - 144 mmol/L 140   Potassium 3.5 - 5.2 mmol/L 4.9   Chloride 96 - 106 mmol/L 101   CO2 20 - 29 mmol/L 22   Calcium 8.7 - 10.3 mg/dL 9.0   Total Protein 6.0 - 8.5 g/dL 6.4   Total Bilirubin 0.0 - 1.2 mg/dL 0.4   Alkaline Phos 44 - 121 IU/L 92   AST 0 - 40 IU/L 20   ALT 0 - 32 IU/L 16       Latest Ref Rng & Units 04/10/2022    8:22 AM  CBC  WBC 3.4 - 10.8 x10E3/uL 10.9   Hemoglobin 11.1 - 15.9 g/dL 10.4   Hematocrit 34.0 - 46.6 % 32.0   Platelets 150 - 450 x10E3/uL 405     No images are attached to the encounter.  No results found.   Assessment and plan- Patient is a 81 y.o. female who presents to [facility] for initial meeting in preparation for starting chemotherapy for the  treatment of bladder cancer.   Chemo Care Clinic/High Risk for ER/Hospitalization during chemotherapy- We discussed the role of the chemo care clinic and identified patient specific risk factors. I discussed that patient was identified as high risk primarily based on:  Patient has past medical history positive for: Past Medical History:  Diagnosis Date   COPD (chronic obstructive pulmonary disease) ([place])    GERD (gastroesophageal reflux disease)    Hyperlipidemia    Hyperparathyroidism, primary ([employer]) 06/25/2016   Hypertension    Osteoporosis    Restless legs syndrome    RLS (restless legs syndrome)     Patient has past surgical history positive for: Past Surgical History:  Procedure Laterality Date   ABDOMINAL HYSTERECTOMY     complete: menorrhagia. 81 yo   BACK SURGERY  2016   lower L 4 to L 5   bladder tack and uterus lift  10/10/2015   CHOLECYSTECTOMY     summer of 2022   IR RADIOLOGIST EVAL & MGMT  12/03/2018   IR RADIOLOGIST EVAL & MGMT  12/31/2018   PARATHYROIDECTOMY Left 07/02/2016   Procedure: LEFT INFERIOR PARATHYROIDECTOMY;  Surgeon: Armandina Gemma, MD;  Location: [facility];  Service: General;  Laterality: Left;   SHOULDER OPEN ROTATOR CUFF REPAIR  12/2019   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    Provided general information including the following:  1.  Date of education: 04/25/2022 2.  Physician name: Dr. Wanita Chamberlain 3.  Diagnosis: Bladder cancer 4.  Stage: I 5.  Cure 6.  Chemotherapy plan including drugs and how often: Concurrent chemoradiation with gemcitabine twice weekly 7.  Start date: 05/08/2022 8.  Other referrals: None at this time 9.  The patient is to call our office with any questions or concerns.  Our office  number [phone], if after hours or on the weekend, call the same number and wait for the answering service.  There is always an oncologist on call. 10.  Medications prescribed: Prochlorperazine 11.  The patient has verbalized  understanding of the treatment plan and has no barriers to adherence or understanding.   Obtained signed consent from patient.   Discussed symptoms including:  1.  Low blood counts including red blood cells, white blood cells and platelets. 2. Infection including to avoid large crowds, wash hands frequently, and stay away from people who were sick.  If fever develops of 100.4 or higher, call our office. 3.  Mucositis:  given instructions on mouth rinse (baking soda and salt mixture).  Keep mouth clean.  Use soft bristle toothbrush.  If mouth sores develop, call our clinic. 4.  Nausea/vomiting:  gave prescriptions for prochlorperazine 10 mg every 6 hours, may take around the clock if persistent.  Patient is allergic to ondansetron. 5.  Diarrhea: use over-the-counter Imodium.  Call clinic if not controlled. 6.  Constipation: use senna-S, 1 to 2 tablets twice a day.  If no BM in 2 to 3 days call the clinic. 7.  Loss of appetite:  try to eat small meals every 2-3 hours.  Call clinic if not eating or drinking. 8.  Taste changes:  zinc 500 mg daily.  If becomes severe call clinic. 9.  Avoid alcoholic beverages. 10.  Drink 2 to 3 quarts of water per day. Call clinic if not able to drink enough for urine to be pale yellow. 11.  Peripheral neuropathy: patient to call if numbness or tingling in hands or feet is persistent      The patient was given written information printed from [employer] patient education on individual chemotherapy agents which includes: Name of medications Approved uses Dose and schedule Storage and handling Handling body fluids and waste Drug and food interactions Possible side effects and management Pregnancy, sexual activity, and contraception Obtaining medication   Gave information on the supportive care team and how to contact them regarding services.  Discussed advanced directives.  The patient does not have their advanced directives.   We discussed that social  determinants of health may have significant impacts on health and outcomes for cancer patients.  Today we discussed specific social determinants of performance status, alcohol use, depression, financial needs, food insecurity, housing, interpersonal violence, social connections, stress, tobacco use, and transportation.    After lengthy discussion the following were identified as areas of need:   Outpatient services: We discussed options including home based and outpatient services, DME, nutrition counseling, and supportive care program. We discussed that patients who participate in regular physical activity report fewer negative impacts of cancer and treatments and report less fatigue.   Financial Concerns: We discussed that living with cancer can create tremendous financial burden.  We discussed options for assistance. I asked that if assistance is needed in affording medications or paying bills to please let us know so that we can provide assistance. We discussed options for food including social services.  Referral to Social work: Introduced Education officer, museum Mort Sawyers and the services she can provide, such as support with utility bill, cell phone and gas vouchers.  Also introduced to [employer], LCSW, our licensed clinical social worker who provides counseling as needed.  Support groups: We discussed options for support groups at [facility]. We discussed options for managing stress including healthy eating and exercise, as well as participating in no charge counseling  services at the cancer center and support groups.  If these are of interest, patient can notify either myself or primary nursing team.We discussed options for management including medications.  Transportation: We discussed options for transportation.  The patient will contact our office if she requires assistance with transportation.  Palliative care services: We have palliative care services available in the cancer center to  discuss goals of care and advanced care planning.  Please let us know if you have any questions or would like to speak to our palliative care practitioner.  Symptom Management Clinic: We discussed our symptom management clinic which is available for acute concerns while receiving treatment such as nausea, vomiting or diarrhea.  We can be reached via telephone at [phone].  We are available for virtual or in person visits on the same day from 9 to 4 PM Monday through Friday.   She denies needing specific assistance at this time.  She will be followed by Dr. Jeralyn Bennett clinical team.   Disposition: RTC on 05/07/2022  Visit Diagnosis 1. Malignant neoplasm of overlapping sites of bladder [facility])     I discussed the assessment and treatment plan with the patient.  The patient was provided an opportunity to ask questions and all were answered. The patient expressed understanding and was in agreement with this plan. She also understands that she can call clinic at any time with any questions, concerns, or complaints.   I provided 35 minutes of face-to-face time during this encounter, and > 50% was spent counseling as documented under my assessment & plan.   Chon Buhl A. Georgann Housekeeper, [place]

## 2022-04-26 ENCOUNTER — Encounter: Payer: Self-pay | Admitting: Oncology

## 2022-04-26 DIAGNOSIS — C678 Malignant neoplasm of overlapping sites of bladder: Secondary | ICD-10-CM | POA: Diagnosis not present

## 2022-04-27 ENCOUNTER — Telehealth: Payer: Self-pay | Admitting: Genetic Counselor

## 2022-04-27 NOTE — Telephone Encounter (Signed)
Called patient per 11/14 in basket. Left voicemail for upcoming appointment.

## 2022-04-30 DIAGNOSIS — C672 Malignant neoplasm of lateral wall of bladder: Secondary | ICD-10-CM | POA: Diagnosis not present

## 2022-04-30 DIAGNOSIS — Z79899 Other long term (current) drug therapy: Secondary | ICD-10-CM | POA: Diagnosis not present

## 2022-04-30 DIAGNOSIS — E785 Hyperlipidemia, unspecified: Secondary | ICD-10-CM | POA: Diagnosis not present

## 2022-04-30 DIAGNOSIS — I110 Hypertensive heart disease with heart failure: Secondary | ICD-10-CM | POA: Diagnosis not present

## 2022-04-30 DIAGNOSIS — I509 Heart failure, unspecified: Secondary | ICD-10-CM | POA: Diagnosis not present

## 2022-04-30 DIAGNOSIS — J449 Chronic obstructive pulmonary disease, unspecified: Secondary | ICD-10-CM | POA: Diagnosis not present

## 2022-04-30 DIAGNOSIS — Z510 Encounter for antineoplastic radiation therapy: Secondary | ICD-10-CM | POA: Diagnosis not present

## 2022-05-01 ENCOUNTER — Ambulatory Visit (INDEPENDENT_AMBULATORY_CARE_PROVIDER_SITE_OTHER): Payer: Medicare Other

## 2022-05-01 ENCOUNTER — Encounter: Payer: Self-pay | Admitting: Genetic Counselor

## 2022-05-01 DIAGNOSIS — I10 Essential (primary) hypertension: Secondary | ICD-10-CM

## 2022-05-01 DIAGNOSIS — Z510 Encounter for antineoplastic radiation therapy: Secondary | ICD-10-CM | POA: Diagnosis not present

## 2022-05-01 DIAGNOSIS — E782 Mixed hyperlipidemia: Secondary | ICD-10-CM

## 2022-05-01 DIAGNOSIS — I5189 Other ill-defined heart diseases: Secondary | ICD-10-CM

## 2022-05-01 DIAGNOSIS — C672 Malignant neoplasm of lateral wall of bladder: Secondary | ICD-10-CM | POA: Diagnosis not present

## 2022-05-01 NOTE — Progress Notes (Signed)
Chronic Care Management Pharmacy Note  05/01/2022 Name:  Alexandra [name] MRN:  [id number] DOB:  09-02-1940  Recommendations: -Patient has appt scheduled for December, this is around time she would be due for Prolia -Non-compliant on all meds. Counseled extensively on importance -Breztri/Entresto PAP for 2024  Subjective: Alexandra [name] is an 81 y.o. year old female who is a primary patient of Cox, Kirsten, MD.  The CCM team was consulted for assistance with disease management and care coordination needs.    Engaged with patient by telephone for follow up visit in response to provider referral for pharmacy case management and/or care coordination services.   Consent to Services:  The patient was given the following information about Chronic Care Management services today, agreed to services, and gave verbal consent: 1. CCM service includes personalized support from designated clinical staff supervised by the primary care provider, including individualized plan of care and coordination with other care providers 2. 24/7 contact phone numbers for assistance for urgent and routine care needs. 3. Service will only be billed when office clinical staff spend 20 minutes or more in a month to coordinate care. 4. Only one practitioner may furnish and bill the service in a calendar month. 5.The patient may stop CCM services at any time (effective at the end of the month) by phone call to the office staff. 6. The patient will be responsible for cost sharing (co-pay) of up to 20% of the service fee (after annual deductible is met). Patient agreed to services and consent obtained.  Patient Care Team: Rochel Brome, MD as PCP - General (Internal Medicine) Lane Hacker, [facility] (Pharmacist) Barbee Cough, MD as Consulting Physician (Internal Medicine)  Recent office visits:  02/27/22 Jerrell Belfast NP. Seen for routine visit. Started on Oemprazole 24m and Ondansetron HCI 415m D/C Esomeprazole.     02/15/22 MiCherly BeachP. Medicare Annual Wellness. No med changes.   12/26/21 HeJerrell BelfastP. Seen for Lymphedema. No med changes.    Recent consult visits:  02/14/22 (Vascular Surgery) DiDeitra MayoD. Seen for Lymphedema. No med changes.    Hospital visits:  None   Objective:  Lab Results  Component Value Date   CREATININE 3.52 (H) 04/10/2022   BUN 66 (H) 04/10/2022   EGFR 13 (L) 04/10/2022   GFRNONAA 25 (L) 03/28/2020   GFRAA 29 (L) 03/28/2020   NA 140 04/10/2022   K 4.9 04/10/2022   CALCIUM 9.0 04/10/2022   CO2 22 04/10/2022   GLUCOSE 122 (H) 04/10/2022    Lab Results  Component Value Date/Time   HGBA1C 5.6 04/04/2021 08:09 AM    Last diabetic Eye exam: No results found for: "HMDIABEYEEXA"  Last diabetic Foot exam: No results found for: "HMDIABFOOTEX"   Lab Results  Component Value Date   CHOL 110 02/27/2022   HDL 44 02/27/2022   LDLCALC 50 02/27/2022   TRIG 77 02/27/2022   CHOLHDL 2.5 02/27/2022       Latest Ref Rng & Units 04/10/2022    8:22 AM 03/27/2022    2:48 PM 02/27/2022    8:07 AM  Hepatic Function  Total Protein 6.0 - 8.5 g/dL 6.4  6.8  6.0   Albumin 3.8 - 4.8 g/dL 3.8  3.9  3.4   AST 0 - 40 IU/L _0 ALT 0 - 32 IU/L _1 Alk Phosphatase 44 - 121 IU/L 92  96  94   Total Bilirubin  0.0 - 1.2 mg/dL 0.4  0.2  0.4     No results found for: "TSH", "FREET4"     Latest Ref Rng & Units 04/10/2022    8:22 AM 03/27/2022    2:48 PM 02/27/2022    8:07 AM  CBC  WBC 3.4 - 10.8 x10E3/uL 10.9  12.3  11.5   Hemoglobin 11.1 - 15.9 g/dL 10.4  10.7  10.5   Hematocrit 34.0 - 46.6 % 32.0  33.4  32.5   Platelets 150 - 450 x10E3/uL 405  501  375     No results found for: "VD25OH"  Clinical ASCVD: Yes  The ASCVD Risk score (Arnett DK, et al., 2019) failed to calculate for the following reasons:   The valid total cholesterol range is 130 to 320 mg/dL       04/10/2022    7:55 AM 02/15/2022    1:31 PM 12/26/2021    8:46 AM   Depression screen PHQ 2/9  Decreased Interest 0 0 0  Down, Depressed, Hopeless 0 0 0  PHQ - 2 Score 0 0 0  Altered sleeping 0  0  Tired, decreased energy 0  0  Change in appetite 0  0  Feeling bad or failure about yourself  0  0  Trouble concentrating 0  0  Moving slowly or fidgety/restless 0  0  Suicidal thoughts 0  0  PHQ-9 Score 0  0  Difficult doing work/chores Not difficult at all  Not difficult at all     Other: (CHADS2VASc if Afib, MMRC or CAT for COPD, ACT, DEXA)  Social History   Tobacco Use  Smoking Status Former   Packs/day: 1.00   Years: 50.00   Total pack years: 50.00   Types: Cigarettes   Quit date: 06/11/2005   Years since quitting: 16.8  Smokeless Tobacco Never   BP Readings from Last 3 Encounters:  04/25/22 133/60  04/24/22 138/65  04/10/22 128/76   Pulse Readings from Last 3 Encounters:  04/25/22 88  04/24/22 98  04/10/22 93   Wt Readings from Last 3 Encounters:  04/25/22 149 lb 12.8 oz (67.9 kg)  04/24/22 149 lb 12.8 oz (67.9 kg)  04/10/22 152 lb (68.9 kg)   BMI Readings from Last 3 Encounters:  04/25/22 27.67 kg/m  04/24/22 27.67 kg/m  04/10/22 26.93 kg/m    Assessment/Interventions: Review of patient past medical history, allergies, medications, health status, including review of consultants reports, laboratory and other test data, was performed as part of comprehensive evaluation and provision of chronic care management services.   SDOH:  (Social Determinants of Health) assessments and interventions performed: Yes SDOH Interventions    Flowsheet Row Chronic Care Management from 05/01/2022 in [place] Telephone from 03/27/2022 in [place] Coordination Office Visit from 10/24/2021 in [place] Management from 09/19/2021 in [place] Management from 07/18/2021 in [place]  SDOH Interventions       Food Insecurity Interventions -- Intervention  Not Indicated Intervention Not Indicated -- --  Housing Interventions -- -- Intervention Not Indicated -- --  Transportation Interventions Intervention Not Indicated Intervention Not Indicated -- Intervention Not Indicated Intervention Not Indicated  Financial Strain Interventions Other (Comment)  [Entresto/Breztri PAP] -- Intervention Not Indicated Other (Comment)  Judithann Sauger PAP] Other (Comment)  [PAP (See CP)]  Physical Activity Interventions -- -- Intervention Not Indicated -- --  Stress Interventions -- -- Intervention Not Indicated -- --  Social Connections  Interventions -- -- Intervention Not Indicated -- --      SDOH Screenings   Food Insecurity: No Food Insecurity (03/27/2022)  Housing: Low Risk  (02/15/2022)  Transportation Needs: No Transportation Needs (05/01/2022)  Utilities: Not At Risk (02/15/2022)  Alcohol Screen: Low Risk  (04/10/2022)  Depression (PHQ2-9): Low Risk  (04/10/2022)  Financial Resource Strain: Medium Risk (05/01/2022)  Physical Activity: Inactive (02/15/2022)  Social Connections: Moderately Integrated (02/15/2022)  Stress: No Stress Concern Present (02/15/2022)  Tobacco Use: Medium Risk (05/01/2022)    CCM Care Plan  Allergies  Allergen Reactions   Contrast Media [Iodinated Contrast Media] Hives, Itching and Rash   Omeprazole Itching   Shellfish Allergy Nausea And Vomiting   Shellfish-Derived Products Nausea And Vomiting   Zofran [Ondansetron Hcl] Itching    Medications Reviewed Today     Reviewed by Lane Hacker, [facility] (Pharmacist) on 05/01/22 at 1140  Med List Status: <None>   Medication Order Taking? Sig Documenting Provider Last Dose Status Informant  atorvastatin (LIPITOR) 40 MG tablet [id number]  Take 1 tablet (40 mg total) by mouth daily. Per ccm Rip Harbour, NP  Active   Budeson-Glycopyrrol-Formoterol (BREZTRI AEROSPHERE) 160-9-4.8 MCG/ACT Hollie Salk [id number]  Inhale 2 puffs into the lungs in the morning and at bedtime. [name],  MD  Active   metoprolol succinate (TOPROL-XL) 25 MG 24 hr tablet [id number]  Take 25 mg by mouth daily. [provider]  Active   prochlorperazine (COMPAZINE) 10 MG tablet [id number]  Take 1 tablet (10 mg total) by mouth every 6 (six) hours as needed for nausea or vomiting. Barbee Cough, MD  Active   rOPINIRole (REQUIP) 2 MG tablet [id number]  TAKE 1 TABLET(2 MG) BY MOUTH IN THE MORNING AND AT BEDTIME Rip Harbour, NP  Active   sacubitril-valsartan (ENTRESTO) 24-26 MG [id number]  Take 1 tablet by mouth 2 (two) times daily. [name], MD  Active   sodium bicarbonate 650 MG tablet [id number] Yes Take 650 mg by mouth 3 (three) times daily. [provider] Taking Active   torsemide (DEMADEX) 20 MG tablet [id number]  Take 1 tablet (20 mg total) by mouth daily. 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[place]) 11/23/2021   Pain of right humerus 11/23/2021   Frequent falls 11/23/2021   Laceration of left lower extremity 10/20/2021   Pedal edema 11/55/2080   Diastolic dysfunction 22/33/6122   Trochanteric bursitis, left hip 07/24/2021   Bronchopneumonia 05/08/2021   Cholecystitis 01/18/2021   Trochanteric bursitis of right hip 12/07/2020   Female stress incontinence 08/03/2019   Mixed conductive and sensorineural hearing loss, 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   Bladder cancer ([place]) 08/05/2018   H/O left nephrectomy 08/05/2018   COPD, 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/2018   Ex-smoker 01/08/2018   Essential hypertension 02/27/2016   Hyperlipemia 02/27/2016    History of kidney cancer 02/27/2016   Age related osteoporosis 02/27/2016   Spinal stenosis 02/27/2016    Immunization History  Administered Date(s) Administered   Fluad Quad(high Dose 65+) 03/25/2019,  02/17/2020, 04/04/2021, 02/27/2022   Influenza, High Dose Seasonal PF 03/27/2017, 04/01/2018   Moderna SARS-COV2 Booster Vaccination 04/12/2020, 12/01/2020   Moderna Sars-Covid-2 Vaccination 06/26/2019, 07/24/2019   Pneumococcal Conjugate-13 10/15/2013, 03/27/2017   Pneumococcal-Unspecified 10/15/2013   Tdap 08/17/2021   Zoster Recombinat (Shingrix) 08/17/2021   Zoster, Live 11/23/2010    Conditions to be addressed/monitored:  Hypertension and Hyperlipidemia  Care Plan : [place]  Updates made by Lane Hacker, [place] since 05/01/2022 12:00 AM     Problem: htn, hld, copd   Priority: High  Onset Date: 01/31/2021     Goal: Patient-Specific Goal   Start Date: 01/31/2021  Expected End Date: 01/31/2022  Recent Progress: On track  Priority: High  Note:    Current Barriers:  Unable to independently afford treatment regimen  Pharmacist Clinical Goal(s):  Patient will verbalize ability to afford treatment regimen through collaboration with PharmD and provider.   Interventions: 1:1 collaboration with [name], MD regarding development and update of comprehensive plan of care as evidenced by provider attestation and co-signature Inter-disciplinary care team collaboration (see longitudinal plan of care) Comprehensive medication review performed; medication list updated in electronic medical record    Heart Failure (Goal: manage symptoms and prevent exacerbations) BP Readings from Last 3 Encounters:  04/25/22 133/60  04/24/22 138/65  04/10/22 128/76  -Controlled -Last ejection fraction: Unknown -HF type: HFpEF (EF > 50%) -NYHA Class: II (slight limitation of activity) -AHA HF Stage: C (Heart disease and symptoms present) -Current  treatment: Entresto 24/26 Appropriate, Effective, Safe, Query accessible Torsemide 81m Appropriate, Effective, Safe, Accessible -Medications previously tried: Amlodipine (For BP)  -Current home BP/HR readings:  November 2023: Doesn't test -Current dietary habits: "Tries to eat healthy" -Current exercise habits: None -Educated on Benefits of medications for managing symptoms and prolonging life November 2023: Patient non-compliant on meds. Was due for Entresto 4 days ago and states she still has 8 days of pills left. Counseled on importance of taking. Also counseled on starting to write BP down. Will start Entresto PAP because willl be expensive once hits DH   Hyperlipidemia: (LDL goal < 100) The ASCVD Risk score (Arnett DK, et al., 2019) failed to calculate for the following reasons:   The valid total cholesterol range is 130 to 320 mg/dL Lab Results  Component Value Date   CHOL 110 02/27/2022   CHOL 133 10/24/2021   CHOL 172 08/17/2021   Lab Results  Component Value Date   HDL 44 02/27/2022   HDL 55 10/24/2021   HDL 65 08/17/2021   Lab Results  Component Value Date   LDLCALC 50 02/27/2022   LDLCALC 63 10/24/2021   LDLCALC 89 08/17/2021   Lab Results  Component Value Date   TRIG 77 02/27/2022   TRIG 75 10/24/2021   TRIG 100 08/17/2021   Lab Results  Component Value Date   CHOLHDL 2.5 02/27/2022   CHOLHDL 2.4 10/24/2021   CHOLHDL 2.6 08/17/2021  No results found for: "LDLDIRECT" -Controlled -Current treatment: Atorvastatin 40 mg daily Appropriate, Effective, Safe, Accessible -Medications previously tried: none reported  -Current dietary habits: limited since gall bladder removal. Trying to find out what she can tolerate. Cooking makes her sick right now.  -Current exercise habits: moving around the house as she recovers.  -Denies hypotensive/hypertensive symptoms -Educated on Cholesterol goals;  Benefits of statin for ASCVD risk reduction; Exercise goal of 150  minutes per week; -Counseled on diet and exercise extensively Feb 2023: Patient driving and didn't have meds  today, unable to go to in-depth April 2023: Simvastatin dose too high to combo with Amlodipine, will let PCP know November 2023: Maintain therapy  Osteoporosis / Osteopenia (Goal Prevent fracture) -Uncontrolled -Last DEXA Scan: 08/10/21   T-Score femur Left:   -08/10/21: -3.0   -01/05/19: -2.2   -01/03/15: -2.4   -11/21/12: -2.6  T-Score femur Total:   -08/10/21: -2.6   -01/05/19: -2.1   -01/03/15: -2.1   -11/21/12: -2.2  T-Score forearm radius:    -08/10/21: -4.6   -01/05/19: -4.3 -Patient is a candidate for pharmacologic treatment due to T-Score<-2.5 -Current treatment  Prolia (Last done June 2023) -Medications previously tried: Alendronate (Started 01/30/16) -Recommend [phone] units of vitamin D daily. April 2023: Recommend alternative therapy   COPD (Goal: control symptoms and prevent exacerbations) -Controlled -Current treatment  Albuterol Appropriate, Effective, Safe, Accessible Breztri (PAP) Appropriate, Effective, Safe, Accessible -Medications previously tried: N/A  -Gold Grade: N/A -Current COPD Classification:  B (high sx, <2 exacerbations/yr) -MMRC/CAT score:     09/19/2021   11:01 AM  CAT Score  Total CAT Score 18  -Pulmonary function testing:  PF Readings from Last 3 Encounters:  No data found for PF  -Exacerbations requiring treatment in last 6 months: None -Patient denies consistent use of maintenance inhaler -Counseled on Proper inhaler technique; -Recommended to continue current medication  Patient Goals/Self-Care Activities Patient will:  - take medications as prescribed focus on medication adherence by using pill box target a minimum of 150 minutes of moderate intensity exercise weekly engage in dietary modifications by working to get nutrients in diet needed since gall bladder surgery.   Follow Up Plan: Telephone follow up appointment with care  management team member scheduled for: May 2024  Arizona Constable, [place].D. - [phone]        Medication Assistance:  Breztri: -PAP Approved 2023 -PAP will be done 2024  Entresto: -Start 2024 PAP on 05/01/22  Compliance/Adherence/Medication fill history: Care Gaps: Last annual wellness visit? None noted  Patient's preferred pharmacy is:  [facility] [place], [place] - [address] AT [place] [address] [place] [address] Phone: [phone] Fax: [phone]  [facility], [place] [place] [place] [address] Phone: [phone] Fax: [phone]  Uses pill box? No -   Pt endorses 100% compliance  Care Plan and Follow Up Patient Decision:  Patient agrees to Care Plan and Follow-up.  Plan: The patient has been provided with contact information for the care management team and has been advised to call with any health related questions or concerns.   CPP F/U May 2024  Arizona Constable, Sherian Rein.D. - [phone]

## 2022-05-01 NOTE — Patient Instructions (Signed)
Visit Information   Goals Addressed   None    Patient Care Plan: CCM Pharmacy Care Plan     Problem Identified: htn, hld, copd   Priority: High  Onset Date: 01/31/2021     Goal: Patient-Specific Goal   Start Date: 01/31/2021  Expected End Date: 01/31/2022  Recent Progress: On track  Priority: High  Note:    Current Barriers:  Unable to independently afford treatment regimen  Pharmacist Clinical Goal(s):  Patient will verbalize ability to afford treatment regimen through collaboration with PharmD and provider.   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 Was due for Entresto 4 days ago and states she still has 8 days of pills left. Counseled on importance of taking. Also counseled on starting to write BP down. Will start Entresto PAP because willl be expensive once hits DH   Hyperlipidemia:  (LDL goal < 100) The ASCVD Risk score (Arnett DK, et al., 2019) failed to calculate for the following reasons:   The valid total cholesterol range is 130 to 320 mg/dL Lab Results  Component Value Date   CHOL 110 02/27/2022   CHOL 133 10/24/2021   CHOL 172 08/17/2021   Lab Results  Component Value Date   HDL 44 02/27/2022   HDL 55 10/24/2021   HDL 65 08/17/2021   Lab Results  Component Value Date   LDLCALC 50 02/27/2022   LDLCALC 63 10/24/2021   LDLCALC 89 08/17/2021   Lab Results  Component Value Date   TRIG 77 02/27/2022   TRIG 75 10/24/2021   TRIG 100 08/17/2021   Lab Results  Component Value Date   CHOLHDL 2.5 02/27/2022   CHOLHDL 2.4 10/24/2021   CHOLHDL 2.6 08/17/2021  No results found for: "LDLDIRECT" -Controlled -Current treatment: Atorvastatin 40 mg daily Appropriate, Effective, Safe, Accessible -Medications previously tried: none reported  -Current dietary habits: limited since gall bladder removal. Trying to find out what she can tolerate. Cooking makes her sick right now.  -Current exercise habits: moving around the house as she recovers.  -Denies hypotensive/hypertensive symptoms -Educated on Cholesterol goals;  Benefits of statin for ASCVD risk reduction; Exercise goal of 150 minutes per week; -Counseled on diet and exercise extensively Feb 2023: Patient driving and didn't have meds today, unable to go to in-depth April 2023: Simvastatin dose too high to combo with Amlodipine, will let PCP know November 2023: Maintain therapy  Osteoporosis / Osteopenia (Goal Prevent fracture) -Uncontrolled -Last DEXA Scan: 08/10/21   T-Score femur Left:   -08/10/21: -3.0   -01/05/19: -2.2   -01/03/15: -2.4   -11/21/12: -2.6  T-Score femur Total:   -08/10/21: -  2.6   -01/05/19: -2.1   -01/03/15: -2.1   -11/21/12: -2.2  T-Score forearm radius:    -08/10/21: -4.6   -01/05/19: -4.3 -Patient is a candidate for pharmacologic treatment due to T-Score<-2.5 -Current treatment   Prolia (Last done June 2023) -Medications previously tried: Alendronate (Started 01/30/16) -Recommend [phone] units of vitamin D daily. April 2023: Recommend alternative therapy   COPD (Goal: control symptoms and prevent exacerbations) -Controlled -Current treatment  Albuterol Appropriate, Effective, Safe, Accessible Breztri (PAP) Appropriate, Effective, Safe, Accessible -Medications previously tried: N/A  -Gold Grade: N/A -Current COPD Classification:  B (high sx, <2 exacerbations/yr) -MMRC/CAT score:     09/19/2021   11:01 AM  CAT Score  Total CAT Score 18  -Pulmonary function testing:  PF Readings from Last 3 Encounters:  No data found for PF  -Exacerbations requiring treatment in last 6 months: None -Patient denies consistent use of maintenance inhaler -Counseled on Proper inhaler technique; -Recommended to continue current medication  Patient Goals/Self-Care Activities Patient will:  - take medications as prescribed focus on medication adherence by using pill box target a minimum of 150 minutes of moderate intensity exercise weekly engage in dietary modifications by working to get nutrients in diet needed since gall bladder surgery.   Follow Up Plan: Telephone follow up appointment with care management team member scheduled for: May 2024  Arizona Constable, [place].D. - [phone]       Ms. Kendzierski was given information about Chronic Care Management services today including:  CCM service includes personalized support from designated clinical staff supervised by her physician, including individualized plan of care and coordination with other care providers 24/7 contact phone numbers for assistance for urgent and routine care needs. Standard insurance, coinsurance, copays and deductibles apply for chronic care management only during months in which we provide at least 20 minutes of these services. Most insurances cover these services at 100%, however patients may be  responsible for any copay, coinsurance and/or deductible if applicable. This service may help you avoid the need for more expensive face-to-face services. Only one practitioner may furnish and bill the service in a calendar month. The patient may stop CCM services at any time (effective at the end of the month) by phone call to the office staff.  Patient agreed to services and verbal consent obtained.   The patient verbalized understanding of instructions, educational materials, and care plan provided today and DECLINED offer to receive copy of patient instructions, educational materials, and care plan.  The pharmacy team will reach out to the patient again over the next 60 days.   Lane Hacker, [place]

## 2022-05-02 ENCOUNTER — Inpatient Hospital Stay: Payer: Medicare Other | Admitting: Genetic Counselor

## 2022-05-02 ENCOUNTER — Other Ambulatory Visit: Payer: Self-pay | Admitting: Genetic Counselor

## 2022-05-02 ENCOUNTER — Inpatient Hospital Stay: Payer: Medicare Other

## 2022-05-02 ENCOUNTER — Encounter: Payer: Medicare Other | Admitting: Genetic Counselor

## 2022-05-02 ENCOUNTER — Other Ambulatory Visit: Payer: Self-pay

## 2022-05-02 DIAGNOSIS — I509 Heart failure, unspecified: Secondary | ICD-10-CM | POA: Diagnosis not present

## 2022-05-02 DIAGNOSIS — C678 Malignant neoplasm of overlapping sites of bladder: Secondary | ICD-10-CM | POA: Diagnosis not present

## 2022-05-02 DIAGNOSIS — I130 Hypertensive heart and chronic kidney disease with heart failure and stage 1 through stage 4 chronic kidney disease, or unspecified chronic kidney disease: Secondary | ICD-10-CM | POA: Diagnosis not present

## 2022-05-02 DIAGNOSIS — M810 Age-related osteoporosis without current pathological fracture: Secondary | ICD-10-CM | POA: Diagnosis not present

## 2022-05-02 DIAGNOSIS — N189 Chronic kidney disease, unspecified: Secondary | ICD-10-CM | POA: Diagnosis not present

## 2022-05-02 DIAGNOSIS — Z5111 Encounter for antineoplastic chemotherapy: Secondary | ICD-10-CM | POA: Diagnosis not present

## 2022-05-02 DIAGNOSIS — C679 Malignant neoplasm of bladder, unspecified: Secondary | ICD-10-CM

## 2022-05-02 DIAGNOSIS — Z85528 Personal history of other malignant neoplasm of kidney: Secondary | ICD-10-CM | POA: Diagnosis not present

## 2022-05-02 DIAGNOSIS — J449 Chronic obstructive pulmonary disease, unspecified: Secondary | ICD-10-CM | POA: Diagnosis not present

## 2022-05-02 LAB — CBC WITH DIFFERENTIAL (CANCER CENTER ONLY)
Abs Immature Granulocytes: 0.17 K/uL — ABNORMAL HIGH (ref 0.00–0.07)
Basophils Absolute: 0.1 K/uL (ref 0.0–0.1)
Basophils Relative: 1 %
Eosinophils Absolute: 0.3 K/uL (ref 0.0–0.5)
Eosinophils Relative: 3 %
HCT: 35.4 % — ABNORMAL LOW (ref 36.0–46.0)
Hemoglobin: 10.5 g/dL — ABNORMAL LOW (ref 12.0–15.0)
Immature Granulocytes: 2 %
Lymphocytes Relative: 24 %
Lymphs Abs: 2.5 K/uL (ref 0.7–4.0)
MCH: 30 pg (ref 26.0–34.0)
MCHC: 29.7 g/dL — ABNORMAL LOW (ref 30.0–36.0)
MCV: 101.1 fL — ABNORMAL HIGH (ref 80.0–100.0)
Monocytes Absolute: 0.8 K/uL (ref 0.1–1.0)
Monocytes Relative: 7 %
Neutro Abs: 6.6 K/uL (ref 1.7–7.7)
Neutrophils Relative %: 63 %
Platelet Count: 394 K/uL (ref 150–400)
RBC: 3.5 MIL/uL — ABNORMAL LOW (ref 3.87–5.11)
RDW: 14.6 % (ref 11.5–15.5)
WBC Count: 10.4 K/uL (ref 4.0–10.5)
nRBC: 0 % (ref 0.0–0.2)

## 2022-05-02 LAB — CMP (CANCER CENTER ONLY)
ALT: 19 U/L (ref 0–44)
AST: 23 U/L (ref 15–41)
Albumin: 3.6 g/dL (ref 3.5–5.0)
Alkaline Phosphatase: 84 U/L (ref 38–126)
Anion gap: 8 (ref 5–15)
BUN: 65 mg/dL — ABNORMAL HIGH (ref 8–23)
CO2: 23 mmol/L (ref 22–32)
Calcium: 9.1 mg/dL (ref 8.9–10.3)
Chloride: 110 mmol/L (ref 98–111)
Creatinine: 2.86 mg/dL — ABNORMAL HIGH (ref 0.44–1.00)
GFR, Estimated: 16 mL/min — ABNORMAL LOW
Glucose, Bld: 111 mg/dL — ABNORMAL HIGH (ref 70–99)
Potassium: 4.6 mmol/L (ref 3.5–5.1)
Sodium: 141 mmol/L (ref 135–145)
Total Bilirubin: 0.5 mg/dL (ref 0.3–1.2)
Total Protein: 7 g/dL (ref 6.5–8.1)

## 2022-05-07 ENCOUNTER — Telehealth: Payer: Self-pay

## 2022-05-07 ENCOUNTER — Other Ambulatory Visit: Payer: Medicare Other

## 2022-05-07 ENCOUNTER — Encounter: Payer: Self-pay | Admitting: Oncology

## 2022-05-07 ENCOUNTER — Ambulatory Visit: Payer: Medicare Other | Admitting: Oncology

## 2022-05-07 DIAGNOSIS — C672 Malignant neoplasm of lateral wall of bladder: Secondary | ICD-10-CM | POA: Diagnosis not present

## 2022-05-07 DIAGNOSIS — K219 Gastro-esophageal reflux disease without esophagitis: Secondary | ICD-10-CM | POA: Diagnosis not present

## 2022-05-07 DIAGNOSIS — M47814 Spondylosis without myelopathy or radiculopathy, thoracic region: Secondary | ICD-10-CM | POA: Diagnosis not present

## 2022-05-07 DIAGNOSIS — E210 Primary hyperparathyroidism: Secondary | ICD-10-CM | POA: Diagnosis not present

## 2022-05-07 DIAGNOSIS — C679 Malignant neoplasm of bladder, unspecified: Secondary | ICD-10-CM | POA: Diagnosis not present

## 2022-05-07 DIAGNOSIS — Z452 Encounter for adjustment and management of vascular access device: Secondary | ICD-10-CM | POA: Diagnosis not present

## 2022-05-07 DIAGNOSIS — C678 Malignant neoplasm of overlapping sites of bladder: Secondary | ICD-10-CM | POA: Diagnosis not present

## 2022-05-07 DIAGNOSIS — J449 Chronic obstructive pulmonary disease, unspecified: Secondary | ICD-10-CM | POA: Diagnosis not present

## 2022-05-07 DIAGNOSIS — Z79899 Other long term (current) drug therapy: Secondary | ICD-10-CM | POA: Diagnosis not present

## 2022-05-07 NOTE — Progress Notes (Signed)
PAP for Entresto 24-'26mg'$  has been started and PAP renewal for Alexandra [name] has been started and all will be mailed out to pt to finish completing.  Alexandra [name], [place] Pharmacist Assistant  [phone]

## 2022-05-08 ENCOUNTER — Other Ambulatory Visit: Payer: Self-pay | Admitting: Pharmacist

## 2022-05-08 ENCOUNTER — Inpatient Hospital Stay: Payer: Medicare Other

## 2022-05-08 ENCOUNTER — Ambulatory Visit: Payer: Medicare Other

## 2022-05-08 ENCOUNTER — Ambulatory Visit: Payer: Medicare Other | Admitting: Oncology

## 2022-05-08 ENCOUNTER — Other Ambulatory Visit: Payer: Medicare Other

## 2022-05-08 VITALS — BP 156/64 | HR 78 | Temp 98.1°F | Resp 18 | Ht 60.83 in | Wt 153.5 lb

## 2022-05-08 DIAGNOSIS — J449 Chronic obstructive pulmonary disease, unspecified: Secondary | ICD-10-CM | POA: Diagnosis not present

## 2022-05-08 DIAGNOSIS — C678 Malignant neoplasm of overlapping sites of bladder: Secondary | ICD-10-CM

## 2022-05-08 DIAGNOSIS — I130 Hypertensive heart and chronic kidney disease with heart failure and stage 1 through stage 4 chronic kidney disease, or unspecified chronic kidney disease: Secondary | ICD-10-CM | POA: Diagnosis not present

## 2022-05-08 DIAGNOSIS — N189 Chronic kidney disease, unspecified: Secondary | ICD-10-CM | POA: Diagnosis not present

## 2022-05-08 DIAGNOSIS — Z5111 Encounter for antineoplastic chemotherapy: Secondary | ICD-10-CM | POA: Diagnosis not present

## 2022-05-08 DIAGNOSIS — M810 Age-related osteoporosis without current pathological fracture: Secondary | ICD-10-CM | POA: Diagnosis not present

## 2022-05-08 MED ORDER — HEPARIN SOD (PORK) LOCK FLUSH 100 UNIT/ML IV SOLN
500.0000 [IU] | Freq: Once | INTRAVENOUS | Status: AC | PRN
  Administered 2022-05-08: 500 [IU]

## 2022-05-08 MED ORDER — SODIUM CHLORIDE 0.9 % IV SOLN: Freq: Once | INTRAVENOUS | Status: AC

## 2022-05-08 MED ORDER — SODIUM CHLORIDE 0.9% FLUSH
10.0000 mL | INTRAVENOUS | Status: DC | PRN
  Administered 2022-05-08: 10 mL

## 2022-05-08 MED ORDER — SODIUM CHLORIDE 0.9 % IV SOLN
27.0000 mg/m2 | Freq: Once | INTRAVENOUS | Status: AC
  Administered 2022-05-08: 38 mg via INTRAVENOUS
  Filled 2022-05-08: qty 1

## 2022-05-08 MED ORDER — PROCHLORPERAZINE MALEATE 10 MG PO TABS
10.0000 mg | ORAL_TABLET | Freq: Once | ORAL | Status: AC
  Administered 2022-05-08: 10 mg via ORAL
  Filled 2022-05-08: qty 1

## 2022-05-08 NOTE — Patient Instructions (Signed)
Gemcitabine Injection What is this medication? GEMCITABINE (jem SYE ta been) treats some types of cancer. It works by slowing down the growth of cancer cells. This medicine may be used for other purposes; ask your health care provider or pharmacist if you have questions. COMMON BRAND NAME(S): Gemzar, Infugem What should I tell my care team before I take this medication? They need to know if you have any of these conditions: Blood disorders Infection Kidney disease Liver disease Lung or breathing disease, such as asthma or COPD Recent or ongoing radiation therapy An unusual or allergic reaction to gemcitabine, other medications, foods, dyes, or preservatives If you or your partner are pregnant or trying to get pregnant Breast-feeding How should I use this medication? This medication is injected into a vein. It is given by your care team in a hospital or clinic setting. Talk to your care team about the use of this medication in children. Special care may be needed. Overdosage: If you think you have taken too much of this medicine contact a poison control center or emergency room at once. NOTE: This medicine is only for you. Do not share this medicine with others. What if I miss a dose? Keep appointments for follow-up doses. It is important not to miss your dose. Call your care team if you are unable to keep an appointment. What may interact with this medication? Interactions have not been studied. This list may not describe all possible interactions. Give your health care provider a list of all the medicines, herbs, non-prescription drugs, or dietary supplements you use. Also tell them if you smoke, drink alcohol, or use illegal drugs. Some items may interact with your medicine. What should I watch for while using this medication? Your condition will be monitored carefully while you are receiving this medication. This medication may make you feel generally unwell. This is not uncommon, as  chemotherapy can affect healthy cells as well as cancer cells. Report any side effects. Continue your course of treatment even though you feel ill unless your care team tells you to stop. In some cases, you may be given additional medications to help with side effects. Follow all directions for their use. This medication may increase your risk of getting an infection. Call your care team for advice if you get a fever, chills, sore throat, or other symptoms of a cold or flu. Do not treat yourself. Try to avoid being around people who are sick. This medication may increase your risk to bruise or bleed. Call your care team if you notice any unusual bleeding. Be careful brushing or flossing your teeth or using a toothpick because you may get an infection or bleed more easily. If you have any dental work done, tell your dentist you are receiving this medication. Avoid taking medications that contain aspirin, acetaminophen, ibuprofen, naproxen, or ketoprofen unless instructed by your care team. These medications may hide a fever. Talk to your care team if you or your partner wish to become pregnant or think you might be pregnant. This medication can cause serious birth defects if taken during pregnancy and for 6 months after the last dose. A negative pregnancy test is required before starting this medication. A reliable form of contraception is recommended while taking this medication and for 6 months after the last dose. Talk to your care team about effective forms of contraception. Do not father a child while taking this medication and for 3 months after the last dose. Use a condom while having sex  during this time period. Do not breastfeed while taking this medication and for at least 1 week after the last dose. This medication may cause infertility. Talk to your care team if you are concerned about your fertility. What side effects may I notice from receiving this medication? Side effects that you should  report to your care team as soon as possible: Allergic reactions--skin rash, itching, hives, swelling of the face, lips, tongue, or throat Capillary leak syndrome--stomach or muscle pain, unusual weakness or fatigue, feeling faint or lightheaded, decrease in the amount of urine, swelling of the ankles, hands, or feet, trouble breathing Infection--fever, chills, cough, sore throat, wounds that don't heal, pain or trouble when passing urine, general feeling of discomfort or being unwell Liver injury--right upper belly pain, loss of appetite, nausea, light-colored stool, dark yellow or brown urine, yellowing skin or eyes, unusual weakness or fatigue Low red blood cell level--unusual weakness or fatigue, dizziness, headache, trouble breathing Lung injury--shortness of breath or trouble breathing, cough, spitting up blood, chest pain, fever Stomach pain, bloody diarrhea, pale skin, unusual weakness or fatigue, decrease in the amount of urine, which may be signs of hemolytic uremic syndrome Sudden and severe headache, confusion, change in vision, seizures, which may be signs of posterior reversible encephalopathy syndrome (PRES) Unusual bruising or bleeding Side effects that usually do not require medical attention (report to your care team if they continue or are bothersome): Diarrhea Drowsiness Hair loss Nausea Pain, redness, or swelling with sores inside the mouth or throat Vomiting This list may not describe all possible side effects. Call your doctor for medical advice about side effects. You may report side effects to FDA at 1-800-FDA-1088. Where should I keep my medication? This medication is given in a hospital or clinic. It will not be stored at home. NOTE: This sheet is a summary. It may not cover all possible information. If you have questions about this medicine, talk to your doctor, pharmacist, or health care provider.  2023 [employer]vier/Gold Standard (2007-07-19 00:00:00) [facility]  Discharge Instructions: Thank you for choosing [facility] to provide your oncology and hematology care.  If you have a lab appointment with the [facility], please go directly to the [facility] and check in at the registration area.   Wear comfortable clothing and clothing appropriate for easy access to any Portacath or PICC line.   We strive to give you quality time with your provider. You may need to reschedule your appointment if you arrive late (15 or more minutes).  Arriving late affects you and other patients whose appointments are after yours.  Also, if you miss three or more appointments without notifying the office, you may be dismissed from the clinic at the provider's discretion.      For prescription refill requests, have your pharmacy contact our office and allow 72 hours for refills to be completed.    Today you received the following chemotherapy and/or immunotherapy agents gemcitabine      To help prevent nausea and vomiting after your treatment, we encourage you to take your nausea medication as directed.  BELOW ARE SYMPTOMS THAT SHOULD BE REPORTED IMMEDIATELY: *FEVER GREATER THAN 100.4 F (38 C) OR HIGHER *CHILLS OR SWEATING *NAUSEA AND VOMITING THAT IS NOT CONTROLLED WITH YOUR NAUSEA MEDICATION *UNUSUAL SHORTNESS OF BREATH *UNUSUAL BRUISING OR BLEEDING *URINARY PROBLEMS (pain or burning when urinating, or frequent urination) *BOWEL PROBLEMS (unusual diarrhea, constipation, pain near the anus) TENDERNESS IN MOUTH AND  THROAT WITH OR WITHOUT PRESENCE OF ULCERS (sore throat, sores in mouth, or a toothache) UNUSUAL RASH, SWELLING OR PAIN  UNUSUAL VAGINAL DISCHARGE OR ITCHING   Items with * indicate a potential emergency and should be followed up as soon as possible or go to the Emergency Department if any problems should occur.  Please show the CHEMOTHERAPY ALERT CARD or IMMUNOTHERAPY ALERT CARD at check-in to the Emergency  Department and triage nurse.  Should you have questions after your visit or need to cancel or reschedule your appointment, please contact [facility]  Dept: [phone]  and follow the prompts.  Office hours are 8:00 a.m. to 4:30 p.m. Monday - Friday. Please note that voicemails left after 4:00 p.m. may not be returned until the following business day.  We are closed weekends and major holidays. You have access to a nurse at all times for urgent questions. Please call the main number to the clinic Dept: [phone] and follow the prompts.  For any non-urgent questions, you may also contact your provider using MyChart. We now offer e-Visits for anyone 71 and older to request care online for non-urgent symptoms. For details visit [email or web address].   Also download the MyChart app! Go to the app store, search "MyChart", open the app, select [facility], and log in with your MyChart username and password.  Masks are optional in the cancer centers. If you would like for your care team to wear a mask while they are taking care of you, please let them know. You may have one support person who is at least 79 years old accompany you for your appointments.

## 2022-05-09 ENCOUNTER — Other Ambulatory Visit: Payer: Medicare Other

## 2022-05-09 ENCOUNTER — Ambulatory Visit: Payer: Medicare Other | Admitting: Oncology

## 2022-05-09 DIAGNOSIS — C672 Malignant neoplasm of lateral wall of bladder: Secondary | ICD-10-CM | POA: Diagnosis not present

## 2022-05-09 DIAGNOSIS — J449 Chronic obstructive pulmonary disease, unspecified: Secondary | ICD-10-CM | POA: Diagnosis not present

## 2022-05-09 DIAGNOSIS — K219 Gastro-esophageal reflux disease without esophagitis: Secondary | ICD-10-CM | POA: Diagnosis not present

## 2022-05-09 DIAGNOSIS — E785 Hyperlipidemia, unspecified: Secondary | ICD-10-CM | POA: Diagnosis not present

## 2022-05-09 DIAGNOSIS — Z510 Encounter for antineoplastic radiation therapy: Secondary | ICD-10-CM | POA: Diagnosis not present

## 2022-05-09 DIAGNOSIS — I509 Heart failure, unspecified: Secondary | ICD-10-CM | POA: Diagnosis not present

## 2022-05-09 DIAGNOSIS — I110 Hypertensive heart disease with heart failure: Secondary | ICD-10-CM | POA: Diagnosis not present

## 2022-05-09 DIAGNOSIS — Z79899 Other long term (current) drug therapy: Secondary | ICD-10-CM | POA: Diagnosis not present

## 2022-05-09 NOTE — Progress Notes (Deleted)
[facility] Cancer Initial Visit:  Patient Care Team: Cox, Kirsten, MD as PCP - General (Internal Medicine) Kennedy, Nathan K, RPH (Pharmacist) Ribakove, Everett C, MD as Consulting Physician (Internal Medicine)  CHIEF COMPLAINTS/PURPOSE OF CONSULTATION:  Oncology History  Bladder cancer (HCC)  08/05/2018 Initial Diagnosis   Bladder cancer (HCC)   04/24/2022 Cancer Staging   Staging form: Urinary Bladder, AJCC 8th Edition - Clinical stage from 04/24/2022: Stage II (cT2, cN0, cM0) - Signed by Ribakove, Everett C, MD on 04/24/2022 Histopathologic type: Transitional cell carcinoma, NOS Stage prefix: Initial diagnosis WHO/ISUP grade (low/high): High Grade Histologic grading system: 2 grade system   05/08/2022 -  Chemotherapy   Patient is on Treatment Plan : BLADDER Gemcitabine Twice Weekly + XRT       HISTORY OF PRESENTING ILLNESS: Alexandra [name] 81 y.o. female is here because of bladder cancer.   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  Superficial lesions along the right and left lateral walls of bladder.  Biopsy sites were fulgurated  September 30, 2018: Cystoscopy with bladder biopsy.  Small superficial lesions along the left lateral wall next to previous resection.  Biopsied and fulgurated  May 05, 2019 cystoscopy with bladder biopsy.  Small superficial recurrences along previous biopsy sites on lateral walls of bladder noted.  These were fulgurated  November 12, 2019 cystoscopy with bladder biopsy.  Small papillary lesion in area of right lateral wall near previous bladder resection scar..   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  Muscularis propria not present  April 27, 2021 cystoscopy with bladder biopsy.  Small papillary lesions in right posterior wall, left lateral wall and anterior wall.  Small papillary fronds at trigone.  Biopsies of the posterior wall of the bladder and bladder neck showed low-grade papillary urothelial carcinoma noninvasive   February 15, 2022: CT abdomen pelvis without contrast.  Previous left nephrectomy noted.  Mild right hydronephrosis to the level of bladder new from previous study.  Focal area of bladder wall thickening in the region of the right ureterovesicular junction raising suspicious for bladder cancer  March 15, 2022: Cystoscopy demonstrated right ureteral obstruction with hydronephrosis.  Underwent right retrograde pyelogram, dilation of ureteral stricture stent placement and TUR BT.   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  Trivial MR and TR.    March 28 2022 CMP notable for creatinine of 2.80 glucose 101 estimated GFR 16 calcium 7.2 albumin 3.1 Alk Phos 86  April 24 2022:  [facility] Consult  Social:  Retired. Factory work.  Married.  Began smoking cigarettes 20 yrs of age, 1 ppd and quit 24 yrs ago.  EtOH none  FMH Mother died 88 old age Father killed when patient was 5 yrs of age Brother died 75 MI Sister alive 80 several medical problems Sister alive 67 thyroid problems.    April 25 2022:  Chemotherapy teaching  May 08 2022:    Day 1 Gemcitabine 27 mg/m2 IV Twice Weekly with RT   May 09 2022:  Scheduled follow up for management of bladder  cancer CT chest without contrast not yet performed   Review of Systems  Constitutional:  Positive for appetite change and unexpected weight change. Negative for chills, fatigue and fever.       Has bouts of nausea since cholecystectomy which interferes with appetite.  Has lost 25 lbs in part due to diuresis of edema  HENT:   Negative for hearing loss, mouth sores, nosebleeds, sore throat and trouble swallowing.   Eyes:  Negative for eye problems and icterus.       Vision changes:  None  Respiratory:  Negative for chest tightness, cough, hemoptysis, shortness of breath and wheezing.         Orthopnea:  Sleeps in lounge chair DOE:  walking up stairs  Cardiovascular:  Negative for chest pain, leg swelling and palpitations.       PND:  none Orthopnea:  none Leg swelling resolved with diuresis  Gastrointestinal:  Positive for nausea. Negative for abdominal pain, blood in stool, constipation, diarrhea and vomiting.  Endocrine:       Cold intolerance:  none Heat intolerance:  none  Genitourinary:  Positive for nocturia. Negative for difficulty urinating, dysuria, frequency and hematuria.        Nocturia x 3 to 4  Musculoskeletal:  Negative for arthralgias, back pain, gait problem and myalgias.  Skin:  Negative for itching, rash and wound.  Neurological:  Negative for dizziness, extremity weakness, gait problem, headaches, light-headedness, numbness and speech difficulty.  Hematological:  Negative for adenopathy. Does not bruise/bleed easily.  Psychiatric/Behavioral:  Negative for suicidal ideas. The patient is not nervous/anxious.        Tosses and turns at night    MEDICAL HISTORY: Past Medical History:  Diagnosis Date   COPD (chronic obstructive pulmonary disease) (HCC)    GERD (gastroesophageal reflux disease)    Hyperlipidemia    Hyperparathyroidism, primary (HCC) 06/25/2016   Hypertension    Osteoporosis    Restless legs syndrome    RLS (restless legs syndrome)     SURGICAL  HISTORY: Past Surgical History:  Procedure Laterality Date   ABDOMINAL HYSTERECTOMY     complete: menorrhagia. 81 yo   BACK SURGERY  2016   lower L 4 to L 5   bladder tack and uterus lift  10/10/2015   CHOLECYSTECTOMY     summer of 2022   IR RADIOLOGIST EVAL & MGMT  12/03/2018   IR RADIOLOGIST EVAL & MGMT  12/31/2018   PARATHYROIDECTOMY Left 07/02/2016   Procedure: LEFT INFERIOR PARATHYROIDECTOMY;  Surgeon: Todd Gerkin, MD;  Location: [facility];  Service: General;  Laterality: Left;   SHOULDER OPEN ROTATOR CUFF REPAIR  12/2019   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    SOCIAL HISTORY: Social History   Socioeconomic History   Marital status: Married    Spouse name: Robert   Number of children: 2   Years of education: 11   Highest education level: 11th grade  Occupational History   Occupation: Retired  Tobacco Use   Smoking status: Former    Packs/day: 1.00    Years: 50.00    Total pack years: 50.00    Types: Cigarettes    Quit date: 06/11/2005    Years since quitting: 16.9   Smokeless tobacco: Never  Vaping Use   Vaping Use: Never used  Substance and Sexual Activity     Alcohol use: No   Drug use: No   Sexual activity: Yes    Partners: Male  Other Topics Concern   Not on file  Social History Narrative   Not on file   Social Determinants of Health   Financial Resource Strain: Medium Risk (05/01/2022)   Overall Financial Resource Strain (CARDIA)    Difficulty of Paying Living Expenses: Somewhat hard  Food Insecurity: No Food Insecurity (03/27/2022)   Hunger Vital Sign    Worried About Running Out of Food in the Last Year: Never true    Ran Out of Food in the Last Year: Never true  Transportation Needs: No Transportation Needs (05/01/2022)   PRAPARE - Transportation    Lack of Transportation (Medical): No    Lack of Transportation (Non-Medical): No  Physical Activity: Inactive (02/15/2022)   Exercise Vital Sign    Days of Exercise per Week: 0 days     Minutes of Exercise per Session: 0 min  Stress: No Stress Concern Present (02/15/2022)   [employer] - Occupational Stress Questionnaire    Feeling of Stress : Only a little  Social Connections: Moderately Integrated (02/15/2022)   Social Connection and Isolation Panel [NHANES]    Frequency of Communication with Friends and Family: More than three times a week    Frequency of Social Gatherings with Friends and Family: More than three times a week    Attends Religious Services: More than 4 times per year    Active Member of Clubs or Organizations: No    Attends Club or Organization Meetings: Never    Marital Status: Married  Intimate Partner Violence: Not At Risk (02/15/2022)   Humiliation, Afraid, Rape, and Kick questionnaire    Fear of Current or Ex-Partner: No    Emotionally Abused: No    Physically Abused: No    Sexually Abused: No    FAMILY HISTORY Family History  Problem Relation Age of Onset   Emphysema Mother    Hyperlipidemia Sister    Hypertension Sister    COPD Sister    Hyperlipidemia Sister    Hypertension Sister    Lymphoma Sister    Obesity Brother    Hyperlipidemia Brother    Hypertension Brother    Breast cancer Neg Hx     ALLERGIES:  is allergic to contrast media [iodinated contrast media], omeprazole, shellfish allergy, shellfish-derived products, and zofran [ondansetron hcl].  MEDICATIONS:  Current Outpatient Medications  Medication Sig Dispense Refill   atorvastatin (LIPITOR) 40 MG tablet Take 1 tablet (40 mg total) by mouth daily. Per ccm 90 tablet 3   Budeson-Glycopyrrol-Formoterol (BREZTRI AEROSPHERE) 160-9-4.8 MCG/ACT AERO Inhale 2 puffs into the lungs in the morning and at bedtime. 10.7 g 6   metoprolol succinate (TOPROL-XL) 25 MG 24 hr tablet Take 25 mg by mouth daily.     prochlorperazine (COMPAZINE) 10 MG tablet Take 1 tablet (10 mg total) by mouth every 6 (six) hours as needed for nausea or vomiting. 30 tablet 1    rOPINIRole (REQUIP) 2 MG tablet TAKE 1 TABLET(2 MG) BY MOUTH IN THE MORNING AND AT BEDTIME 180 tablet 6   sacubitril-valsartan (ENTRESTO) 24-26 MG Take 1 tablet by mouth 2 (two) times daily. 60 tablet 6   sodium bicarbonate 650 MG tablet Take 650 mg by mouth 3 (three) times daily.     torsemide (DEMADEX) 20 MG tablet Take 1 tablet (20 mg total) by mouth daily. 30 tablet 3   VENTOLIN HFA 108 (90 Base) MCG/ACT   inhaler SMARTSIG:1-2 Puff(s) By Mouth Every 4 Hours PRN (Patient not taking: Reported on 04/25/2022)     No current facility-administered medications for this visit.    PHYSICAL EXAMINATION:  ECOG PERFORMANCE STATUS: 1 - Symptomatic but completely ambulatory   There were no vitals filed for this visit.   There were no vitals filed for this visit.    Physical Exam Vitals and nursing note reviewed.  Constitutional:      General: She is not in acute distress.    Appearance: Normal appearance. She is normal weight. She is not ill-appearing, toxic-appearing or diaphoretic.     Comments: Here with daughter.  Elderly but looks younger than age  HENT:     Head: Normocephalic and atraumatic.     Right Ear: External ear normal.     Left Ear: External ear normal.     Nose: Nose normal. No congestion or rhinorrhea.  Eyes:     General: No scleral icterus.    Extraocular Movements: Extraocular movements intact.     Conjunctiva/sclera: Conjunctivae normal.     Pupils: Pupils are equal, round, and reactive to light.  Cardiovascular:     Rate and Rhythm: Normal rate and regular rhythm.     Heart sounds: No murmur heard.    No friction rub. No gallop.  Pulmonary:     Effort: Pulmonary effort is normal. No respiratory distress.     Breath sounds: Normal breath sounds. No stridor. No wheezing, rhonchi or rales.  Abdominal:     General: Bowel sounds are normal.     Palpations: Abdomen is soft.     Tenderness: There is no abdominal tenderness. There is no guarding or rebound.   Musculoskeletal:        General: No swelling, tenderness or deformity.     Cervical back: Normal range of motion and neck supple. No rigidity or tenderness.     Right lower leg: Edema present.     Left lower leg: Edema present.  Lymphadenopathy:     Head:     Right side of head: No submental, submandibular, tonsillar, preauricular, posterior auricular or occipital adenopathy.     Left side of head: No submental, submandibular, tonsillar, preauricular, posterior auricular or occipital adenopathy.     Cervical: No cervical adenopathy.     Right cervical: No superficial, deep or posterior cervical adenopathy.    Left cervical: No superficial, deep or posterior cervical adenopathy.     Upper Body:     Right upper body: No supraclavicular, axillary, pectoral or epitrochlear adenopathy.     Left upper body: No supraclavicular, axillary, pectoral or epitrochlear adenopathy.  Skin:    General: Skin is warm.     Coloration: Skin is not jaundiced or pale.     Findings: No bruising or erythema.  Neurological:     General: No focal deficit present.     Mental Status: She is alert and oriented to person, place, and time.     Cranial Nerves: No cranial nerve deficit.     Gait: Gait normal.  Psychiatric:        Mood and Affect: Mood normal.        Behavior: Behavior normal.        Thought Content: Thought content normal.        Judgment: Judgment normal.      LABORATORY DATA: I have personally reviewed the data as listed:  Appointment on 05/02/2022  Component Date Value Ref Range Status   WBC Count   05/02/2022 10.4  4.0 - 10.5 K/uL Final   RBC 05/02/2022 3.50 (L)  3.87 - 5.11 MIL/uL Final   Hemoglobin 05/02/2022 10.5 (L)  12.0 - 15.0 g/dL Final   HCT 05/02/2022 35.4 (L)  36.0 - 46.0 % Final   MCV 05/02/2022 101.1 (H)  80.0 - 100.0 fL Final   MCH 05/02/2022 30.0  26.0 - 34.0 pg Final   MCHC 05/02/2022 29.7 (L)  30.0 - 36.0 g/dL Final   RDW 05/02/2022 14.6  11.5 - 15.5 % Final    Platelet Count 05/02/2022 394  150 - 400 K/uL Final   nRBC 05/02/2022 0.0  0.0 - 0.2 % Final   Neutrophils Relative % 05/02/2022 63  % Final   Neutro Abs 05/02/2022 6.6  1.7 - 7.7 K/uL Final   Lymphocytes Relative 05/02/2022 24  % Final   Lymphs Abs 05/02/2022 2.5  0.7 - 4.0 K/uL Final   Monocytes Relative 05/02/2022 7  % Final   Monocytes Absolute 05/02/2022 0.8  0.1 - 1.0 K/uL Final   Eosinophils Relative 05/02/2022 3  % Final   Eosinophils Absolute 05/02/2022 0.3  0.0 - 0.5 K/uL Final   Basophils Relative 05/02/2022 1  % Final   Basophils Absolute 05/02/2022 0.1  0.0 - 0.1 K/uL Final   Immature Granulocytes 05/02/2022 2  % Final   Abs Immature Granulocytes 05/02/2022 0.17 (H)  0.00 - 0.07 K/uL Final   Performed at [facility], [place] [address], [place], [place] [address]   Sodium 05/02/2022 141  135 - 145 mmol/L Final   Potassium 05/02/2022 4.6  3.5 - 5.1 mmol/L Final   Chloride 05/02/2022 110  98 - 111 mmol/L Final   CO2 05/02/2022 23  22 - 32 mmol/L Final   Glucose, Bld 05/02/2022 111 (H)  70 - 99 mg/dL Final   Glucose reference range applies only to samples taken after fasting for at least 8 hours.   BUN 05/02/2022 65 (H)  8 - 23 mg/dL Final   Creatinine 05/02/2022 2.86 (H)  0.44 - 1.00 mg/dL Final   Calcium 05/02/2022 9.1  8.9 - 10.3 mg/dL Final   Total Protein 05/02/2022 7.0  6.5 - 8.1 g/dL Final   Albumin 05/02/2022 3.6  3.5 - 5.0 g/dL Final   AST 05/02/2022 23  15 - 41 U/L Final   ALT 05/02/2022 19  0 - 44 U/L Final   Alkaline Phosphatase 05/02/2022 84  38 - 126 U/L Final   Total Bilirubin 05/02/2022 0.5  0.3 - 1.2 mg/dL Final   GFR, Estimated 05/02/2022 16 (L)  >60 mL/min Final   Comment: (NOTE) Calculated using the CKD-EPI Creatinine Equation (2021)    Anion gap 05/02/2022 8  5 - 15 Final   Performed at [facility], [place] [address], [place], [place] [address]  Office Visit on 04/10/2022  Component Date Value Ref Range Status    Glucose 04/10/2022 122 (H)  70 - 99 mg/dL Final   BUN 04/10/2022 66 (H)  8 - 27 mg/dL Final   Creatinine, Ser 04/10/2022 3.52 (H)  0.57 - 1.00 mg/dL Final   eGFR 04/10/2022 13 (L)  >59 mL/min/1.73 Final   BUN/Creatinine Ratio 04/10/2022 19  12 - 28 Final   Sodium 04/10/2022 140  134 - 144 mmol/L Final   Potassium 04/10/2022 4.9  3.5 - 5.2 mmol/L Final   Chloride 04/10/2022 101  96 - 106 mmol/L Final   CO2 04/10/2022 22  20 - 29 mmol/L Final  Calcium 04/10/2022 9.0  8.7 - 10.3 mg/dL Final   Total Protein 04/10/2022 6.4  6.0 - 8.5 g/dL Final   Albumin 04/10/2022 3.8  3.8 - 4.8 g/dL Final   Globulin, Total 04/10/2022 2.6  1.5 - 4.5 g/dL Final   Albumin/Globulin Ratio 04/10/2022 1.5  1.2 - 2.2 Final   Bilirubin Total 04/10/2022 0.4  0.0 - 1.2 mg/dL Final   Alkaline Phosphatase 04/10/2022 92  44 - 121 IU/L Final   AST 04/10/2022 20  0 - 40 IU/L Final   ALT 04/10/2022 16  0 - 32 IU/L Final   WBC 04/10/2022 10.9 (H)  3.4 - 10.8 x10E3/uL Final   RBC 04/10/2022 3.49 (L)  3.77 - 5.28 x10E6/uL Final   Hemoglobin 04/10/2022 10.4 (L)  11.1 - 15.9 g/dL Final   Hematocrit 04/10/2022 32.0 (L)  34.0 - 46.6 % Final   MCV 04/10/2022 92  79 - 97 fL Final   MCH 04/10/2022 29.8  26.6 - 33.0 pg Final   MCHC 04/10/2022 32.5  31.5 - 35.7 g/dL Final   RDW 04/10/2022 12.8  11.7 - 15.4 % Final   Platelets 04/10/2022 405  150 - 450 x10E3/uL Final   Neutrophils 04/10/2022 65  Not Estab. % Final   Lymphs 04/10/2022 24  Not Estab. % Final   Monocytes 04/10/2022 7  Not Estab. % Final   Eos 04/10/2022 3  Not Estab. % Final   Basos 04/10/2022 0  Not Estab. % Final   Neutrophils Absolute 04/10/2022 7.2 (H)  1.4 - 7.0 x10E3/uL Final   Lymphocytes Absolute 04/10/2022 2.6  0.7 - 3.1 x10E3/uL Final   Monocytes Absolute 04/10/2022 0.8  0.1 - 0.9 x10E3/uL Final   EOS (ABSOLUTE) 04/10/2022 0.3  0.0 - 0.4 x10E3/uL Final   Basophils Absolute 04/10/2022 0.0  0.0 - 0.2 x10E3/uL Final   Immature Granulocytes 04/10/2022 1  Not  Estab. % Final   Immature Grans (Abs) 04/10/2022 0.1  0.0 - 0.1 x10E3/uL Final    RADIOGRAPHIC STUDIES: I have personally reviewed the radiological images as listed and agree with the findings in the report  No results found.  ASSESSMENT/PLAN  81 y.o. female with medical history notable for COPD, GERD, hyperlipidemia, hyperparathyroidism, hypertension, osteoporosis, restless leg syndrome for chronic kidney disease, CT contrast allergy, gout, CKD, congestive heart failure.  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  Focal area of bladder wall thickening in the region of the right ureterovesicular junction raising suspicious for bladder cancer  October 2023:  Cystoscopy. Right ureteral obstruction with hydronephrosis.   Stent placed.  Biopsied.     Will complete staging with uncontrasted CT chest  Major risk factor is long history of tobacco use  Renal cell carcinoma, Left kidney:  Treated with nephrectomy in 2003  Chronic Kidney Disease:  Multifactorial with largest component being nephrectomy.  Contributing factors are HTN, age, CHF.    Therapeutics:  Dictated by age, comorbidities especially CKD which excludes consideration of cystectomy and platinum.  Has been evaluated by Radiation Oncology.  She is a reasonable candidate for XRT +  twice weekly low dose Gemcitabine.    Chemotherapy Risks:  Discussed the potential side effects of chemotherapy with the patient.  These include but are not limited to:  Fatigue, Hair loss, low blood counts (anemia,  thrombocytopenia) that may necessitate transfusion, bleeding, infection, nausea/ vomiting/Appetite changes/ Constipation/ Diarrhea, mucositis, Neuropathy/ neurologic problems, skin and nail changes such as dry skin and color change, Urine and bladder changes and kidney problems, weight changes, mood changes, decreased libido/fertility problems, damage to heart and lungs.  Some of these side effects can be life threatening, may be permanent and can result in hospitalization and/or death.  In shared decision making patient has agreed to proceed with chemotherapy.  Will refer patient for formal chemotherapy teaching.    Poor venous access:  In preparation for chemotherapy we will arrange for port-a-cath placement.  We discussed benefits (more efficient access to administer chemotherapy and supportive care medications/IVF, imaging contrast, decreased risk of tissue damage from medications) and disadvantages (risks of infections, thrombosis, surgical procedure, periodic flushing).  Patient has elected to proceed with port placement.      History of kidney and bladder cancer:  Refer to genetics.    Cancer Staging  Bladder cancer [facility]) Staging form: Urinary Bladder, AJCC 8th Edition - Clinical stage from 04/24/2022: Stage II (cT2, cN0, cM0) - Signed by Barbee Cough, MD on 04/24/2022 Histopathologic type: Transitional cell carcinoma, NOS Stage prefix: Initial diagnosis WHO/ISUP grade (low/high): High Grade Histologic grading system: 2 grade system    No problem-specific Assessment & Plan notes found for this encounter.   No orders of the defined types were placed in this encounter.   All questions were answered. The patient knows to call the clinic with any problems, questions or concerns.  This note was electronically signed.    Barbee Cough, MD  05/09/2022 12:14 PM

## 2022-05-10 ENCOUNTER — Inpatient Hospital Stay: Payer: Medicare Other

## 2022-05-10 ENCOUNTER — Inpatient Hospital Stay (INDEPENDENT_AMBULATORY_CARE_PROVIDER_SITE_OTHER): Payer: Medicare Other | Admitting: Oncology

## 2022-05-10 ENCOUNTER — Encounter: Payer: Self-pay | Admitting: Oncology

## 2022-05-10 VITALS — BP 175/76 | HR 84 | Temp 97.7°F | Resp 18 | Wt 152.5 lb

## 2022-05-10 DIAGNOSIS — I110 Hypertensive heart disease with heart failure: Secondary | ICD-10-CM | POA: Diagnosis not present

## 2022-05-10 DIAGNOSIS — Y633 Inadvertent exposure of patient to radiation during medical care: Secondary | ICD-10-CM

## 2022-05-10 DIAGNOSIS — I509 Heart failure, unspecified: Secondary | ICD-10-CM | POA: Diagnosis not present

## 2022-05-10 DIAGNOSIS — C678 Malignant neoplasm of overlapping sites of bladder: Secondary | ICD-10-CM | POA: Diagnosis not present

## 2022-05-10 DIAGNOSIS — Z1379 Encounter for other screening for genetic and chromosomal anomalies: Secondary | ICD-10-CM | POA: Diagnosis not present

## 2022-05-10 DIAGNOSIS — Z510 Encounter for antineoplastic radiation therapy: Secondary | ICD-10-CM | POA: Diagnosis not present

## 2022-05-10 DIAGNOSIS — Z09 Encounter for follow-up examination after completed treatment for conditions other than malignant neoplasm: Secondary | ICD-10-CM

## 2022-05-10 DIAGNOSIS — M810 Age-related osteoporosis without current pathological fracture: Secondary | ICD-10-CM | POA: Diagnosis not present

## 2022-05-10 DIAGNOSIS — J449 Chronic obstructive pulmonary disease, unspecified: Secondary | ICD-10-CM | POA: Diagnosis not present

## 2022-05-10 DIAGNOSIS — Z7189 Other specified counseling: Secondary | ICD-10-CM | POA: Diagnosis not present

## 2022-05-10 DIAGNOSIS — C679 Malignant neoplasm of bladder, unspecified: Secondary | ICD-10-CM | POA: Diagnosis not present

## 2022-05-10 DIAGNOSIS — E785 Hyperlipidemia, unspecified: Secondary | ICD-10-CM

## 2022-05-10 DIAGNOSIS — Z8042 Family history of malignant neoplasm of prostate: Secondary | ICD-10-CM | POA: Diagnosis not present

## 2022-05-10 DIAGNOSIS — N1832 Chronic kidney disease, stage 3b: Secondary | ICD-10-CM

## 2022-05-10 DIAGNOSIS — K219 Gastro-esophageal reflux disease without esophagitis: Secondary | ICD-10-CM | POA: Diagnosis not present

## 2022-05-10 DIAGNOSIS — C672 Malignant neoplasm of lateral wall of bladder: Secondary | ICD-10-CM | POA: Diagnosis not present

## 2022-05-10 MED FILL — Gemcitabine HCl Inj 1 GM/26.3ML (38 MG/ML) (Base Equiv): INTRAVENOUS | Qty: 1 | Status: AC

## 2022-05-10 NOTE — Progress Notes (Signed)
[place] [facility] Initial Visit:  Patient Care Team: Rochel Brome, MD as PCP - General (Internal Medicine) Lane Hacker, [facility] (Pharmacist) Barbee Cough, MD as Consulting Physician (Internal Medicine)  CHIEF COMPLAINTS/PURPOSE OF CONSULTATION:  Oncology History  Bladder cancer [facility])  08/05/2018 Initial Diagnosis   Bladder cancer ([place])   04/24/2022 Cancer Staging   Staging form: Urinary Bladder, AJCC 8th Edition - Clinical stage from 04/24/2022: Stage II (cT2, cN0, cM0) - Signed by Barbee Cough, MD on 04/24/2022 Histopathologic type: Transitional cell carcinoma, NOS Stage prefix: Initial diagnosis WHO/ISUP grade (low/high): High Grade Histologic grading system: 2 grade system   05/08/2022 -  Chemotherapy   Patient is on Treatment Plan : BLADDER Gemcitabine Twice Weekly + XRT       HISTORY OF PRESENTING ILLNESS: Alexandra [name] 81 y.o. female is here because of bladder cancer.   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  Superficial lesions along the right and left lateral walls of bladder.  Biopsy sites were fulgurated  September 30, 2018: Cystoscopy with bladder biopsy.  Small superficial lesions along the left lateral wall next to previous resection.  Biopsied and fulgurated  May 05, 2019 cystoscopy with bladder biopsy.  Small superficial recurrences along previous biopsy sites on lateral walls of bladder noted.  These were fulgurated  November 12, 2019 cystoscopy with bladder biopsy.  Small papillary lesion in area of right lateral wall near previous bladder resection scar..   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  Muscularis propria not present  April 27, 2021 cystoscopy with bladder biopsy.  Small papillary lesions in right posterior wall, left lateral wall and anterior wall.  Small papillary fronds at trigone.  Biopsies of the posterior wall of the bladder and bladder neck showed low-grade papillary urothelial carcinoma noninvasive   February 15, 2022: CT abdomen pelvis without contrast.  Previous left nephrectomy noted.  Mild right hydronephrosis to the level of bladder new from previous study.  Focal area of bladder wall thickening in the region of the right ureterovesicular junction raising suspicious for bladder cancer  March 15, 2022: Cystoscopy demonstrated right ureteral obstruction with hydronephrosis.  Underwent right retrograde pyelogram, dilation of ureteral stricture stent placement and TUR BT.   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  Trivial MR and TR.    March 28 2022 CMP notable for creatinine of 2.80 glucose 101 estimated GFR 16 calcium 7.2 albumin 3.1 Alk Phos 86  April 24 2022:  [facility] Medical Oncology Consult  Social:  Retired. Factory work.  Married.  Began smoking cigarettes 20 yrs of age, 1 ppd and quit 24 yrs ago.  EtOH none  [place] Mother died 54 old age Father killed when patient was 34 yrs of age Brother died 38 MI Sister alive 13 several medical problems Sister alive 67 thyroid problems.    April 25 2022:  Chemotherapy teaching  May 07 2022:  Port placed  May 08 2022:  Day 1 Gemcitabine 27 mg/m2 IV Twice Weekly with RT   May 09 2022:  Began XRT    May 10 2022:  Scheduled follow up for management of bladder cancer.  Feels well.  No dysuria, pyuria, frequency or urgency.  No nausea or emesis CT chest without contrast not yet performed.  ECOG 1.  Has gained 3 lbs.     Review of Systems  Constitutional:  Positive for appetite change. Negative for chills, fatigue, fever and unexpected weight change.       Has bouts of nausea since cholecystectomy which interferes with appetite.  H  HENT:   Negative for hearing loss, mouth sores, nosebleeds, sore throat and trouble swallowing.   Eyes:  Negative for eye problems and icterus.       Vision changes:  None  Respiratory:  Negative for chest tightness, cough, hemoptysis, shortness of breath and wheezing.         Orthopnea:  Sleeps in lounge chair DOE:  walking up stairs  Cardiovascular:  Negative for chest pain, leg swelling and palpitations.       PND:  none Orthopnea:  none Leg swelling resolved with diuresis  Gastrointestinal:  Negative for abdominal pain, blood in stool, constipation, diarrhea, nausea and vomiting.  Endocrine:       Cold intolerance:  none Heat intolerance:  none  Genitourinary:  Positive for nocturia. Negative for difficulty urinating, dysuria, frequency and hematuria.        Nocturia x 3 to 4  Musculoskeletal:  Negative for arthralgias, back pain, gait problem and myalgias.  Skin:  Negative for itching, rash and wound.  Neurological:  Negative for dizziness, extremity weakness, gait problem, headaches, light-headedness, numbness and speech difficulty.  Hematological:  Negative for adenopathy. Does not bruise/bleed easily.  Psychiatric/Behavioral:  Negative for suicidal ideas. The patient is not nervous/anxious.        Tosses and turns at night    MEDICAL HISTORY: Past Medical History:  Diagnosis Date   COPD (chronic obstructive pulmonary disease) (HCC)    GERD (gastroesophageal reflux disease)    Hyperlipidemia    Hyperparathyroidism, primary ([place])  06/25/2016   Hypertension    Osteoporosis    Restless legs syndrome    RLS (restless legs syndrome)     SURGICAL HISTORY: Past Surgical History:  Procedure Laterality Date   ABDOMINAL HYSTERECTOMY     complete: menorrhagia. 81 yo   BACK SURGERY  2016   lower L 4 to L 5   bladder tack and uterus lift  10/10/2015   CHOLECYSTECTOMY     summer of 2022   IR RADIOLOGIST EVAL & MGMT  12/03/2018   IR RADIOLOGIST EVAL & MGMT  12/31/2018   PARATHYROIDECTOMY Left 07/02/2016   Procedure: LEFT INFERIOR PARATHYROIDECTOMY;  Surgeon: Armandina Gemma, MD;  Location: [facility];  Service: General;  Laterality: Left;   SHOULDER OPEN ROTATOR CUFF REPAIR  12/2019   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    SOCIAL HISTORY: Social History   Socioeconomic History   Marital status: Married    Spouse name: Herbie [place]   Number of children: 2   Years of education: 11   Highest education level: 11th grade  Occupational History   Occupation: Retired  Tobacco Use   Smoking status: Former    Packs/day: 1.00    Years: 50.00    Total pack years: 50.00    Types: Cigarettes    Quit date: 06/11/2005  Years since quitting: 16.9   Smokeless tobacco: Never  Vaping Use   Vaping Use: Never used  Substance and Sexual Activity   Alcohol use: No   Drug use: No   Sexual activity: Yes    Partners: Male  Other Topics Concern   Not on file  Social History Narrative   Not on file   Social Determinants of Health   Financial Resource Strain: Medium Risk (05/01/2022)   Overall Financial Resource Strain (CARDIA)    Difficulty of Paying Living Expenses: Somewhat hard  Food Insecurity: No Food Insecurity (03/27/2022)   Hunger Vital Sign    Worried About Running Out of Food in the Last Year: Never true    Ran Out of Food in the Last Year: Never true  Transportation Needs: No Transportation Needs (05/01/2022)   PRAPARE - Hydrologist (Medical): No    Lack of Transportation  (Non-Medical): No  Physical Activity: Inactive (02/15/2022)   Exercise Vital Sign    Days of Exercise per Week: 0 days    Minutes of Exercise per Session: 0 min  Stress: No Stress Concern Present (02/15/2022)   [place]    Feeling of Stress : Only a little  Social Connections: Moderately Integrated (02/15/2022)   Social Connection and Isolation Panel [NHANES]    Frequency of Communication with Friends and Family: More than three times a week    Frequency of Social Gatherings with Friends and Family: More than three times a week    Attends Religious Services: More than 4 times per year    Active Member of [employer] or Organizations: No    Attends Archivist Meetings: Never    Marital Status: Married  Human resources officer Violence: Not At Risk (02/15/2022)   Humiliation, Afraid, Rape, and Kick questionnaire    Fear of Current or Ex-Partner: No    Emotionally Abused: No    Physically Abused: No    Sexually Abused: No    FAMILY HISTORY Family History  Problem Relation Age of Onset   Emphysema Mother    Hyperlipidemia Sister    Hypertension Sister    COPD Sister    Hyperlipidemia Sister    Hypertension Sister    Lymphoma Sister    Obesity Brother    Hyperlipidemia Brother    Hypertension Brother    Breast cancer Neg Hx     ALLERGIES:  is allergic to contrast media [iodinated contrast media], omeprazole, shellfish allergy, shellfish-derived products, and zofran [ondansetron hcl].  MEDICATIONS:  Current Outpatient Medications  Medication Sig Dispense Refill   atorvastatin (LIPITOR) 40 MG tablet Take 1 tablet (40 mg total) by mouth daily. Per ccm 90 tablet 3   Budeson-Glycopyrrol-Formoterol (BREZTRI AEROSPHERE) 160-9-4.8 MCG/ACT AERO Inhale 2 puffs into the lungs in the morning and at bedtime. 10.7 g 6   metoprolol succinate (TOPROL-XL) 25 MG 24 hr tablet Take 25 mg by mouth daily.     prochlorperazine (COMPAZINE)  10 MG tablet Take 1 tablet (10 mg total) by mouth every 6 (six) hours as needed for nausea or vomiting. 30 tablet 1   rOPINIRole (REQUIP) 2 MG tablet TAKE 1 TABLET(2 MG) BY MOUTH IN THE MORNING AND AT BEDTIME 180 tablet 6   sacubitril-valsartan (ENTRESTO) 24-26 MG Take 1 tablet by mouth 2 (two) times daily. 60 tablet 6   sodium bicarbonate 650 MG tablet Take 650 mg by mouth 3 (three) times daily.  torsemide (DEMADEX) 20 MG tablet Take 1 tablet (20 mg total) by mouth daily. 30 tablet 3   VENTOLIN HFA 108 (90 Base) MCG/ACT inhaler      No current facility-administered medications for this visit.    PHYSICAL EXAMINATION:  ECOG PERFORMANCE STATUS: 1 - Symptomatic but completely ambulatory   Vitals:   05/10/22 0935  BP: (!) 175/76  Pulse: 84  Resp: 18  Temp: 97.7 F (36.5 C)  SpO2: 100%     Filed Weights   05/10/22 0935  Weight: 152 lb 8 oz (69.2 kg)      Physical Exam Vitals and nursing note reviewed.  Constitutional:      General: She is not in acute distress.    Appearance: Normal appearance. She is normal weight. She is not ill-appearing, toxic-appearing or diaphoretic.     Comments: Here alone.  Elderly but looks younger than age  HENT:     Head: Normocephalic and atraumatic.     Right Ear: External ear normal.     Left Ear: External ear normal.     Nose: Nose normal. No congestion or rhinorrhea.  Eyes:     General: No scleral icterus.    Extraocular Movements: Extraocular movements intact.     Conjunctiva/sclera: Conjunctivae normal.     Pupils: Pupils are equal, round, and reactive to light.  Cardiovascular:     Rate and Rhythm: Normal rate and regular rhythm.     Heart sounds: No murmur heard.    No friction rub. No gallop.  Pulmonary:     Effort: Pulmonary effort is normal. No respiratory distress.     Breath sounds: Normal breath sounds. No stridor. No wheezing, rhonchi or rales.  Abdominal:     General: Bowel sounds are normal.     Palpations:  Abdomen is soft.     Tenderness: There is no abdominal tenderness. There is no guarding or rebound.  Musculoskeletal:        General: No swelling, tenderness or deformity.     Cervical back: Normal range of motion and neck supple. No rigidity or tenderness.     Right lower leg: Edema present.     Left lower leg: Edema present.  Lymphadenopathy:     Head:     Right side of head: No submental, submandibular, tonsillar, preauricular, posterior auricular or occipital adenopathy.     Left side of head: No submental, submandibular, tonsillar, preauricular, posterior auricular or occipital adenopathy.     Cervical: No cervical adenopathy.     Right cervical: No superficial, deep or posterior cervical adenopathy.    Left cervical: No superficial, deep or posterior cervical adenopathy.     Upper Body:     Right upper body: No supraclavicular, axillary, pectoral or epitrochlear adenopathy.     Left upper body: No supraclavicular, axillary, pectoral or epitrochlear adenopathy.  Skin:    General: Skin is warm.     Coloration: Skin is not jaundiced or pale.     Findings: No bruising or erythema.  Neurological:     General: No focal deficit present.     Mental Status: She is alert and oriented to person, place, and time.     Cranial Nerves: No cranial nerve deficit.     Gait: Gait normal.  Psychiatric:        Mood and Affect: Mood normal.        Behavior: Behavior normal.        Thought Content: Thought content normal.  Judgment: Judgment normal.      LABORATORY DATA: I have personally reviewed the data as listed:  Appointment on 05/02/2022  Component Date Value Ref Range Status   WBC Count 05/02/2022 10.4  4.0 - 10.5 K/uL Final   RBC 05/02/2022 3.50 (L)  3.87 - 5.11 MIL/uL Final   Hemoglobin 05/02/2022 10.5 (L)  12.0 - 15.0 g/dL Final   HCT 05/02/2022 35.4 (L)  36.0 - 46.0 % Final   MCV 05/02/2022 101.1 (H)  80.0 - 100.0 fL Final   MCH 05/02/2022 30.0  26.0 - 34.0 pg Final    MCHC 05/02/2022 29.7 (L)  30.0 - 36.0 g/dL Final   RDW 05/02/2022 14.6  11.5 - 15.5 % Final   Platelet Count 05/02/2022 394  150 - 400 K/uL Final   nRBC 05/02/2022 0.0  0.0 - 0.2 % Final   Neutrophils Relative % 05/02/2022 63  % Final   Neutro Abs 05/02/2022 6.6  1.7 - 7.7 K/uL Final   Lymphocytes Relative 05/02/2022 24  % Final   Lymphs Abs 05/02/2022 2.5  0.7 - 4.0 K/uL Final   Monocytes Relative 05/02/2022 7  % Final   Monocytes Absolute 05/02/2022 0.8  0.1 - 1.0 K/uL Final   Eosinophils Relative 05/02/2022 3  % Final   Eosinophils Absolute 05/02/2022 0.3  0.0 - 0.5 K/uL Final   Basophils Relative 05/02/2022 1  % Final   Basophils Absolute 05/02/2022 0.1  0.0 - 0.1 K/uL Final   Immature Granulocytes 05/02/2022 2  % Final   Abs Immature Granulocytes 05/02/2022 0.17 (H)  0.00 - 0.07 K/uL Final   Performed at [facility], [place] [address], [place], [place] [address]   Sodium 05/02/2022 141  135 - 145 mmol/L Final   Potassium 05/02/2022 4.6  3.5 - 5.1 mmol/L Final   Chloride 05/02/2022 110  98 - 111 mmol/L Final   CO2 05/02/2022 23  22 - 32 mmol/L Final   Glucose, Bld 05/02/2022 111 (H)  70 - 99 mg/dL Final   Glucose reference range applies only to samples taken after fasting for at least 8 hours.   BUN 05/02/2022 65 (H)  8 - 23 mg/dL Final   Creatinine 05/02/2022 2.86 (H)  0.44 - 1.00 mg/dL Final   Calcium 05/02/2022 9.1  8.9 - 10.3 mg/dL Final   Total Protein 05/02/2022 7.0  6.5 - 8.1 g/dL Final   Albumin 05/02/2022 3.6  3.5 - 5.0 g/dL Final   AST 05/02/2022 23  15 - 41 U/L Final   ALT 05/02/2022 19  0 - 44 U/L Final   Alkaline Phosphatase 05/02/2022 84  38 - 126 U/L Final   Total Bilirubin 05/02/2022 0.5  0.3 - 1.2 mg/dL Final   GFR, Estimated 05/02/2022 16 (L)  >60 mL/min Final   Comment: (NOTE) Calculated using the CKD-EPI Creatinine Equation (2021)    Anion gap 05/02/2022 8  5 - 15 Final   Performed at [facility], [place] [address]  [place], [place] [address]  Office Visit on 04/10/2022  Component Date Value Ref Range Status   Glucose 04/10/2022 122 (H)  70 - 99 mg/dL Final   BUN 04/10/2022 66 (H)  8 - 27 mg/dL Final   Creatinine, Ser 04/10/2022 3.52 (H)  0.57 - 1.00 mg/dL Final   eGFR 04/10/2022 13 (L)  >59 mL/min/1.73 Final   BUN/Creatinine Ratio 04/10/2022 19  12 - 28 Final   Sodium 04/10/2022 140  134 - 144 mmol/L Final  Potassium 04/10/2022 4.9  3.5 - 5.2 mmol/L Final   Chloride 04/10/2022 101  96 - 106 mmol/L Final   CO2 04/10/2022 22  20 - 29 mmol/L Final   Calcium 04/10/2022 9.0  8.7 - 10.3 mg/dL Final   Total Protein 04/10/2022 6.4  6.0 - 8.5 g/dL Final   Albumin 04/10/2022 3.8  3.8 - 4.8 g/dL Final   Globulin, Total 04/10/2022 2.6  1.5 - 4.5 g/dL Final   Albumin/Globulin Ratio 04/10/2022 1.5  1.2 - 2.2 Final   Bilirubin Total 04/10/2022 0.4  0.0 - 1.2 mg/dL Final   Alkaline Phosphatase 04/10/2022 92  44 - 121 IU/L Final   AST 04/10/2022 20  0 - 40 IU/L Final   ALT 04/10/2022 16  0 - 32 IU/L Final   WBC 04/10/2022 10.9 (H)  3.4 - 10.8 x10E3/uL Final   RBC 04/10/2022 3.49 (L)  3.77 - 5.28 x10E6/uL Final   Hemoglobin 04/10/2022 10.4 (L)  11.1 - 15.9 g/dL Final   Hematocrit 04/10/2022 32.0 (L)  34.0 - 46.6 % Final   MCV 04/10/2022 92  79 - 97 fL Final   MCH 04/10/2022 29.8  26.6 - 33.0 pg Final   MCHC 04/10/2022 32.5  31.5 - 35.7 g/dL Final   RDW 04/10/2022 12.8  11.7 - 15.4 % Final   Platelets 04/10/2022 405  150 - 450 x10E3/uL Final   Neutrophils 04/10/2022 65  Not Estab. % Final   Lymphs 04/10/2022 24  Not Estab. % Final   Monocytes 04/10/2022 7  Not Estab. % Final   Eos 04/10/2022 3  Not Estab. % Final   Basos 04/10/2022 0  Not Estab. % Final   Neutrophils Absolute 04/10/2022 7.2 (H)  1.4 - 7.0 x10E3/uL Final   Lymphocytes Absolute 04/10/2022 2.6  0.7 - 3.1 x10E3/uL Final   Monocytes Absolute 04/10/2022 0.8  0.1 - 0.9 x10E3/uL Final   EOS (ABSOLUTE) 04/10/2022 0.3  0.0 - 0.4 x10E3/uL Final   Basophils  Absolute 04/10/2022 0.0  0.0 - 0.2 x10E3/uL Final   Immature Granulocytes 04/10/2022 1  Not Estab. % Final   Immature Grans (Abs) 04/10/2022 0.1  0.0 - 0.1 x10E3/uL Final    RADIOGRAPHIC STUDIES: I have personally reviewed the radiological images as listed and agree with the findings in the report  No results found.  ASSESSMENT/PLAN  81 y.o. female with medical history notable for COPD, GERD, hyperlipidemia, hyperparathyroidism, hypertension, osteoporosis, restless leg syndrome for chronic kidney disease, CT contrast allergy, gout, CKD, congestive heart failure.  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  Focal area of bladder wall thickening in the region of the right ureterovesicular junction raising suspicious for bladder cancer  October 2023:  Cystoscopy. Right ureteral obstruction with hydronephrosis.   Stent placed.  Biopsied.     Will complete staging with uncontrasted CT chest  Major risk factor is long history of tobacco use  Renal cell carcinoma, Left kidney:  Treated with nephrectomy in 2003  Chronic Kidney Disease:  Multifactorial with largest component being nephrectomy.  Contributing factors are HTN, age, CHF.  Therapeutics:  Dictated by age, comorbidities especially CKD which excludes consideration of cystectomy and platinum.  Has been evaluated by Radiation Oncology.    Has received chemotherapy teaching May 08 2022:  Day 1 Gemcitabine 27 mg/m2 IV Twice Weekly with RT  Tolerating treatment well but will follow closely to  minimize side effects   Poor venous access:  Has undergone port placement.   History of kidney and bladder cancer:  Refered to genetics.    Cancer Staging  Bladder cancer [facility]) Staging form: Urinary Bladder, AJCC 8th Edition - Clinical stage from 04/24/2022: Stage II (cT2, cN0, cM0) - Signed by Barbee Cough, MD on 04/24/2022 Histopathologic type: Transitional cell carcinoma, NOS Stage prefix: Initial diagnosis WHO/ISUP grade (low/high): High Grade Histologic grading system: 2 grade system    No problem-specific Assessment & Plan notes found for this encounter.   No orders of the defined types were placed in this encounter.   All questions were answered. The patient knows to call the clinic with any problems, questions or concerns.  This note was electronically signed.    Barbee Cough, MD  05/10/2022 9:40 AM

## 2022-05-11 ENCOUNTER — Inpatient Hospital Stay: Payer: Medicare Other | Attending: Oncology

## 2022-05-11 ENCOUNTER — Encounter: Payer: Self-pay | Admitting: Oncology

## 2022-05-11 ENCOUNTER — Ambulatory Visit: Payer: Medicare Other | Admitting: Family Medicine

## 2022-05-11 VITALS — BP 152/46 | HR 88 | Temp 98.3°F | Resp 18 | Ht 60.83 in | Wt 154.1 lb

## 2022-05-11 DIAGNOSIS — J449 Chronic obstructive pulmonary disease, unspecified: Secondary | ICD-10-CM | POA: Diagnosis not present

## 2022-05-11 DIAGNOSIS — R300 Dysuria: Secondary | ICD-10-CM | POA: Diagnosis not present

## 2022-05-11 DIAGNOSIS — I130 Hypertensive heart and chronic kidney disease with heart failure and stage 1 through stage 4 chronic kidney disease, or unspecified chronic kidney disease: Secondary | ICD-10-CM | POA: Insufficient documentation

## 2022-05-11 DIAGNOSIS — K219 Gastro-esophageal reflux disease without esophagitis: Secondary | ICD-10-CM | POA: Insufficient documentation

## 2022-05-11 DIAGNOSIS — G2581 Restless legs syndrome: Secondary | ICD-10-CM | POA: Diagnosis not present

## 2022-05-11 DIAGNOSIS — C678 Malignant neoplasm of overlapping sites of bladder: Secondary | ICD-10-CM | POA: Insufficient documentation

## 2022-05-11 DIAGNOSIS — C672 Malignant neoplasm of lateral wall of bladder: Secondary | ICD-10-CM | POA: Diagnosis not present

## 2022-05-11 DIAGNOSIS — N189 Chronic kidney disease, unspecified: Secondary | ICD-10-CM | POA: Insufficient documentation

## 2022-05-11 DIAGNOSIS — Z09 Encounter for follow-up examination after completed treatment for conditions other than malignant neoplasm: Secondary | ICD-10-CM | POA: Insufficient documentation

## 2022-05-11 DIAGNOSIS — Z79899 Other long term (current) drug therapy: Secondary | ICD-10-CM | POA: Diagnosis not present

## 2022-05-11 DIAGNOSIS — Z5111 Encounter for antineoplastic chemotherapy: Secondary | ICD-10-CM | POA: Insufficient documentation

## 2022-05-11 DIAGNOSIS — Z510 Encounter for antineoplastic radiation therapy: Secondary | ICD-10-CM | POA: Diagnosis not present

## 2022-05-11 DIAGNOSIS — Z85528 Personal history of other malignant neoplasm of kidney: Secondary | ICD-10-CM | POA: Diagnosis not present

## 2022-05-11 DIAGNOSIS — I509 Heart failure, unspecified: Secondary | ICD-10-CM | POA: Diagnosis not present

## 2022-05-11 DIAGNOSIS — Y633 Inadvertent exposure of patient to radiation during medical care: Secondary | ICD-10-CM | POA: Insufficient documentation

## 2022-05-11 DIAGNOSIS — E785 Hyperlipidemia, unspecified: Secondary | ICD-10-CM | POA: Diagnosis not present

## 2022-05-11 DIAGNOSIS — I110 Hypertensive heart disease with heart failure: Secondary | ICD-10-CM | POA: Diagnosis not present

## 2022-05-11 MED ORDER — SODIUM CHLORIDE 0.9 % IV SOLN
27.0000 mg/m2 | Freq: Once | INTRAVENOUS | Status: AC
  Administered 2022-05-11: 38 mg via INTRAVENOUS
  Filled 2022-05-11: qty 1

## 2022-05-11 MED ORDER — SODIUM CHLORIDE 0.9% FLUSH
10.0000 mL | INTRAVENOUS | Status: DC | PRN
  Administered 2022-05-11: 10 mL

## 2022-05-11 MED ORDER — SODIUM CHLORIDE 0.9 % IV SOLN: Freq: Once | INTRAVENOUS | Status: AC

## 2022-05-11 MED ORDER — PROCHLORPERAZINE MALEATE 10 MG PO TABS: 10.0000 mg | ORAL_TABLET | Freq: Once | ORAL | Status: DC

## 2022-05-11 MED ORDER — HEPARIN SOD (PORK) LOCK FLUSH 100 UNIT/ML IV SOLN
500.0000 [IU] | Freq: Once | INTRAVENOUS | Status: AC | PRN
  Administered 2022-05-11: 500 [IU]

## 2022-05-14 ENCOUNTER — Telehealth: Payer: Self-pay | Admitting: Genetic Counselor

## 2022-05-14 ENCOUNTER — Encounter: Payer: Self-pay | Admitting: Genetic Counselor

## 2022-05-14 DIAGNOSIS — Z1379 Encounter for other screening for genetic and chromosomal anomalies: Secondary | ICD-10-CM | POA: Insufficient documentation

## 2022-05-14 NOTE — Telephone Encounter (Signed)
Genetic testing was ordered on 05/10/2022, and patient is scheduled for 06/15/2022 in [place].  [facility] testing was ordered and completed on 05/13/2022.  Results are in the [facility] genetics file.

## 2022-05-15 ENCOUNTER — Telehealth: Payer: Self-pay

## 2022-05-15 ENCOUNTER — Inpatient Hospital Stay: Payer: Medicare Other

## 2022-05-15 DIAGNOSIS — J449 Chronic obstructive pulmonary disease, unspecified: Secondary | ICD-10-CM | POA: Diagnosis not present

## 2022-05-15 DIAGNOSIS — C672 Malignant neoplasm of lateral wall of bladder: Secondary | ICD-10-CM | POA: Diagnosis not present

## 2022-05-15 DIAGNOSIS — K219 Gastro-esophageal reflux disease without esophagitis: Secondary | ICD-10-CM | POA: Diagnosis not present

## 2022-05-15 DIAGNOSIS — E785 Hyperlipidemia, unspecified: Secondary | ICD-10-CM | POA: Diagnosis not present

## 2022-05-15 DIAGNOSIS — I110 Hypertensive heart disease with heart failure: Secondary | ICD-10-CM | POA: Diagnosis not present

## 2022-05-15 DIAGNOSIS — Z510 Encounter for antineoplastic radiation therapy: Secondary | ICD-10-CM | POA: Diagnosis not present

## 2022-05-15 NOTE — Telephone Encounter (Signed)
Notified scheduling, pharmacy, Dr Federico Flake and Anette Guarneri, that pt has reported she tested positive for COVID during the night.

## 2022-05-15 NOTE — Telephone Encounter (Signed)
Spoke with Dr. Orlene Erm, he recommends to hold radiation this week, resume on Monday 05/21/2022.    Called patient gave her our plan to resume treatments on 05/21/2022.  [name] is also pushing her appt to next week.

## 2022-05-15 NOTE — Telephone Encounter (Signed)
-----   Message from Georgette Shell, RN sent at 05/15/2022 10:22 AM EST ----- Regarding: FW: COVID Per secure chat Dr. Jeralyn Bennett response.  She should call her PCP to manage this and stay home until negative   ----- Message ----- From: Georgette Shell, RN Sent: 05/15/2022   8:32 AM EST To: Barbee Cough, MD Subject: COVID                                          Patient has tested positive for COVID.  She is on break today from radiation d/t diarrhea.  I will look into [facility] policy for treating patient.

## 2022-05-16 ENCOUNTER — Other Ambulatory Visit: Payer: Medicare Other

## 2022-05-16 ENCOUNTER — Ambulatory Visit: Payer: Medicare Other | Admitting: Oncology

## 2022-05-17 ENCOUNTER — Encounter: Payer: Self-pay | Admitting: Oncology

## 2022-05-18 ENCOUNTER — Inpatient Hospital Stay: Payer: Medicare Other

## 2022-05-21 ENCOUNTER — Inpatient Hospital Stay: Payer: Medicare Other

## 2022-05-21 DIAGNOSIS — C678 Malignant neoplasm of overlapping sites of bladder: Secondary | ICD-10-CM

## 2022-05-21 DIAGNOSIS — E785 Hyperlipidemia, unspecified: Secondary | ICD-10-CM | POA: Diagnosis not present

## 2022-05-21 DIAGNOSIS — I509 Heart failure, unspecified: Secondary | ICD-10-CM | POA: Diagnosis not present

## 2022-05-21 DIAGNOSIS — C672 Malignant neoplasm of lateral wall of bladder: Secondary | ICD-10-CM | POA: Diagnosis not present

## 2022-05-21 DIAGNOSIS — Z5111 Encounter for antineoplastic chemotherapy: Secondary | ICD-10-CM | POA: Diagnosis not present

## 2022-05-21 DIAGNOSIS — J449 Chronic obstructive pulmonary disease, unspecified: Secondary | ICD-10-CM | POA: Diagnosis not present

## 2022-05-21 DIAGNOSIS — Z510 Encounter for antineoplastic radiation therapy: Secondary | ICD-10-CM | POA: Diagnosis not present

## 2022-05-21 DIAGNOSIS — I110 Hypertensive heart disease with heart failure: Secondary | ICD-10-CM | POA: Diagnosis not present

## 2022-05-21 DIAGNOSIS — K219 Gastro-esophageal reflux disease without esophagitis: Secondary | ICD-10-CM | POA: Diagnosis not present

## 2022-05-21 DIAGNOSIS — I130 Hypertensive heart and chronic kidney disease with heart failure and stage 1 through stage 4 chronic kidney disease, or unspecified chronic kidney disease: Secondary | ICD-10-CM | POA: Diagnosis not present

## 2022-05-21 DIAGNOSIS — R300 Dysuria: Secondary | ICD-10-CM | POA: Diagnosis not present

## 2022-05-21 LAB — CBC WITH DIFFERENTIAL (CANCER CENTER ONLY)
Abs Immature Granulocytes: 0.17 10*3/uL — ABNORMAL HIGH (ref 0.00–0.07)
Basophils Absolute: 0 10*3/uL (ref 0.0–0.1)
Basophils Relative: 0 %
Eosinophils Absolute: 0.2 10*3/uL (ref 0.0–0.5)
Eosinophils Relative: 2 %
HCT: 33.6 % — ABNORMAL LOW (ref 36.0–46.0)
Hemoglobin: 10.2 g/dL — ABNORMAL LOW (ref 12.0–15.0)
Immature Granulocytes: 2 %
Lymphocytes Relative: 23 %
Lymphs Abs: 2.3 10*3/uL (ref 0.7–4.0)
MCH: 31 pg (ref 26.0–34.0)
MCHC: 30.4 g/dL (ref 30.0–36.0)
MCV: 102.1 fL — ABNORMAL HIGH (ref 80.0–100.0)
Monocytes Absolute: 0.8 10*3/uL (ref 0.1–1.0)
Monocytes Relative: 8 %
Neutro Abs: 6.7 10*3/uL (ref 1.7–7.7)
Neutrophils Relative %: 65 %
Platelet Count: 329 10*3/uL (ref 150–400)
RBC: 3.29 MIL/uL — ABNORMAL LOW (ref 3.87–5.11)
RDW: 15.3 % (ref 11.5–15.5)
WBC Count: 10.2 10*3/uL (ref 4.0–10.5)
nRBC: 0 % (ref 0.0–0.2)

## 2022-05-21 LAB — CMP (CANCER CENTER ONLY)
ALT: 18 U/L (ref 0–44)
AST: 21 U/L (ref 15–41)
Albumin: 3.2 g/dL — ABNORMAL LOW (ref 3.5–5.0)
Alkaline Phosphatase: 73 U/L (ref 38–126)
Anion gap: 7 (ref 5–15)
BUN: 42 mg/dL — ABNORMAL HIGH (ref 8–23)
CO2: 24 mmol/L (ref 22–32)
Calcium: 9.2 mg/dL (ref 8.9–10.3)
Chloride: 109 mmol/L (ref 98–111)
Creatinine: 2.62 mg/dL — ABNORMAL HIGH (ref 0.44–1.00)
GFR, Estimated: 18 mL/min — ABNORMAL LOW (ref >60)
Glucose, Bld: 113 mg/dL — ABNORMAL HIGH (ref 70–99)
Potassium: 6 mmol/L — ABNORMAL HIGH (ref 3.5–5.1)
Sodium: 140 mmol/L (ref 135–145)
Total Bilirubin: 0.6 mg/dL (ref 0.3–1.2)
Total Protein: 6.6 g/dL (ref 6.5–8.1)

## 2022-05-21 MED FILL — Gemcitabine HCl Inj 200 MG/5.26ML (38 MG/ML) (Base Equiv): INTRAVENOUS | Qty: 1 | Status: AC

## 2022-05-22 ENCOUNTER — Inpatient Hospital Stay: Payer: Medicare Other

## 2022-05-22 ENCOUNTER — Other Ambulatory Visit: Payer: Self-pay | Admitting: Pharmacist

## 2022-05-22 VITALS — BP 136/58 | HR 74 | Temp 97.7°F | Resp 18 | Ht 60.83 in | Wt 149.8 lb

## 2022-05-22 DIAGNOSIS — I509 Heart failure, unspecified: Secondary | ICD-10-CM | POA: Diagnosis not present

## 2022-05-22 DIAGNOSIS — C678 Malignant neoplasm of overlapping sites of bladder: Secondary | ICD-10-CM | POA: Diagnosis not present

## 2022-05-22 DIAGNOSIS — R300 Dysuria: Secondary | ICD-10-CM | POA: Diagnosis not present

## 2022-05-22 DIAGNOSIS — Z1379 Encounter for other screening for genetic and chromosomal anomalies: Secondary | ICD-10-CM | POA: Diagnosis not present

## 2022-05-22 DIAGNOSIS — C679 Malignant neoplasm of bladder, unspecified: Secondary | ICD-10-CM | POA: Diagnosis not present

## 2022-05-22 DIAGNOSIS — K219 Gastro-esophageal reflux disease without esophagitis: Secondary | ICD-10-CM | POA: Diagnosis not present

## 2022-05-22 DIAGNOSIS — Z8042 Family history of malignant neoplasm of prostate: Secondary | ICD-10-CM | POA: Diagnosis not present

## 2022-05-22 DIAGNOSIS — Z5111 Encounter for antineoplastic chemotherapy: Secondary | ICD-10-CM | POA: Diagnosis not present

## 2022-05-22 DIAGNOSIS — C672 Malignant neoplasm of lateral wall of bladder: Secondary | ICD-10-CM | POA: Diagnosis not present

## 2022-05-22 DIAGNOSIS — J449 Chronic obstructive pulmonary disease, unspecified: Secondary | ICD-10-CM | POA: Diagnosis not present

## 2022-05-22 DIAGNOSIS — I130 Hypertensive heart and chronic kidney disease with heart failure and stage 1 through stage 4 chronic kidney disease, or unspecified chronic kidney disease: Secondary | ICD-10-CM | POA: Diagnosis not present

## 2022-05-22 DIAGNOSIS — I110 Hypertensive heart disease with heart failure: Secondary | ICD-10-CM | POA: Diagnosis not present

## 2022-05-22 DIAGNOSIS — E785 Hyperlipidemia, unspecified: Secondary | ICD-10-CM | POA: Diagnosis not present

## 2022-05-22 DIAGNOSIS — Z510 Encounter for antineoplastic radiation therapy: Secondary | ICD-10-CM | POA: Diagnosis not present

## 2022-05-22 MED ORDER — PROCHLORPERAZINE MALEATE 10 MG PO TABS: 10.0000 mg | ORAL_TABLET | Freq: Once | ORAL | Status: DC

## 2022-05-22 MED ORDER — SODIUM CHLORIDE 0.9 % IV SOLN: Freq: Once | INTRAVENOUS | Status: AC

## 2022-05-22 MED ORDER — SODIUM CHLORIDE 0.9% FLUSH
10.0000 mL | INTRAVENOUS | Status: DC | PRN
  Administered 2022-05-22: 10 mL

## 2022-05-22 MED ORDER — HEPARIN SOD (PORK) LOCK FLUSH 100 UNIT/ML IV SOLN
500.0000 [IU] | Freq: Once | INTRAVENOUS | Status: AC | PRN
  Administered 2022-05-22: 500 [IU]

## 2022-05-22 MED ORDER — SODIUM CHLORIDE 0.9 % IV SOLN
27.0000 mg/m2 | Freq: Once | INTRAVENOUS | Status: AC
  Administered 2022-05-22: 38 mg via INTRAVENOUS
  Filled 2022-05-22: qty 1

## 2022-05-22 NOTE — Patient Instructions (Signed)
Gemcitabine Injection What is this medication? GEMCITABINE (jem SYE ta been) treats some types of cancer. It works by slowing down the growth of cancer cells. This medicine may be used for other purposes; ask your health care provider or pharmacist if you have questions. COMMON BRAND NAME(S): Gemzar, Infugem What should I tell my care team before I take this medication? They need to know if you have any of these conditions: Blood disorders Infection Kidney disease Liver disease Lung or breathing disease, such as asthma or COPD Recent or ongoing radiation therapy An unusual or allergic reaction to gemcitabine, other medications, foods, dyes, or preservatives If you or your partner are pregnant or trying to get pregnant Breast-feeding How should I use this medication? This medication is injected into a vein. It is given by your care team in a hospital or clinic setting. Talk to your care team about the use of this medication in children. Special care may be needed. Overdosage: If you think you have taken too much of this medicine contact a poison control center or emergency room at once. NOTE: This medicine is only for you. Do not share this medicine with others. What if I miss a dose? Keep appointments for follow-up doses. It is important not to miss your dose. Call your care team if you are unable to keep an appointment. What may interact with this medication? Interactions have not been studied. This list may not describe all possible interactions. Give your health care provider a list of all the medicines, herbs, non-prescription drugs, or dietary supplements you use. Also tell them if you smoke, drink alcohol, or use illegal drugs. Some items may interact with your medicine. What should I watch for while using this medication? Your condition will be monitored carefully while you are receiving this medication. This medication may make you feel generally unwell. This is not uncommon, as  chemotherapy can affect healthy cells as well as cancer cells. Report any side effects. Continue your course of treatment even though you feel ill unless your care team tells you to stop. In some cases, you may be given additional medications to help with side effects. Follow all directions for their use. This medication may increase your risk of getting an infection. Call your care team for advice if you get a fever, chills, sore throat, or other symptoms of a cold or flu. Do not treat yourself. Try to avoid being around people who are sick. This medication may increase your risk to bruise or bleed. Call your care team if you notice any unusual bleeding. Be careful brushing or flossing your teeth or using a toothpick because you may get an infection or bleed more easily. If you have any dental work done, tell your dentist you are receiving this medication. Avoid taking medications that contain aspirin, acetaminophen, ibuprofen, naproxen, or ketoprofen unless instructed by your care team. These medications may hide a fever. Talk to your care team if you or your partner wish to become pregnant or think you might be pregnant. This medication can cause serious birth defects if taken during pregnancy and for 6 months after the last dose. A negative pregnancy test is required before starting this medication. A reliable form of contraception is recommended while taking this medication and for 6 months after the last dose. Talk to your care team about effective forms of contraception. Do not father a child while taking this medication and for 3 months after the last dose. Use a condom while having sex  during this time period. Do not breastfeed while taking this medication and for at least 1 week after the last dose. This medication may cause infertility. Talk to your care team if you are concerned about your fertility. What side effects may I notice from receiving this medication? Side effects that you should  report to your care team as soon as possible: Allergic reactions--skin rash, itching, hives, swelling of the face, lips, tongue, or throat Capillary leak syndrome--stomach or muscle pain, unusual weakness or fatigue, feeling faint or lightheaded, decrease in the amount of urine, swelling of the ankles, hands, or feet, trouble breathing Infection--fever, chills, cough, sore throat, wounds that don't heal, pain or trouble when passing urine, general feeling of discomfort or being unwell Liver injury--right upper belly pain, loss of appetite, nausea, light-colored stool, dark yellow or brown urine, yellowing skin or eyes, unusual weakness or fatigue Low red blood cell level--unusual weakness or fatigue, dizziness, headache, trouble breathing Lung injury--shortness of breath or trouble breathing, cough, spitting up blood, chest pain, fever Stomach pain, bloody diarrhea, pale skin, unusual weakness or fatigue, decrease in the amount of urine, which may be signs of hemolytic uremic syndrome Sudden and severe headache, confusion, change in vision, seizures, which may be signs of posterior reversible encephalopathy syndrome (PRES) Unusual bruising or bleeding Side effects that usually do not require medical attention (report to your care team if they continue or are bothersome): Diarrhea Drowsiness Hair loss Nausea Pain, redness, or swelling with sores inside the mouth or throat Vomiting This list may not describe all possible side effects. Call your doctor for medical advice about side effects. You may report side effects to FDA at 1-800-FDA-1088. Where should I keep my medication? This medication is given in a hospital or clinic. It will not be stored at home. NOTE: This sheet is a summary. It may not cover all possible information. If you have questions about this medicine, talk to your doctor, pharmacist, or health care provider.  2023 Elsevier/Gold Standard (2007-07-19 00:00:00)  [place] [facility]  Discharge Instructions: Thank you for choosing [facility] to provide your oncology and hematology care.  If you have a lab appointment with the [facility], please go directly to the [facility] and check in at the registration area.   Wear comfortable clothing and clothing appropriate for easy access to any Portacath or PICC line.   We strive to give you quality time with your provider. You may need to reschedule your appointment if you arrive late (15 or more minutes).  Arriving late affects you and other patients whose appointments are after yours.  Also, if you miss three or more appointments without notifying the office, you may be dismissed from the clinic at the provider's discretion.      For prescription refill requests, have your pharmacy contact our office and allow 72 hours for refills to be completed.    Today you received the following chemotherapy and/or immunotherapy agents GEMCITABINE      To help prevent nausea and vomiting after your treatment, we encourage you to take your nausea medication as directed.  BELOW ARE SYMPTOMS THAT SHOULD BE REPORTED IMMEDIATELY: *FEVER GREATER THAN 100.4 F (38 C) OR HIGHER *CHILLS OR SWEATING *NAUSEA AND VOMITING THAT IS NOT CONTROLLED WITH YOUR NAUSEA MEDICATION *UNUSUAL SHORTNESS OF BREATH *UNUSUAL BRUISING OR BLEEDING *URINARY PROBLEMS (pain or burning when urinating, or frequent urination) *BOWEL PROBLEMS (unusual diarrhea, constipation, pain near the anus) TENDERNESS IN MOUTH  AND THROAT WITH OR WITHOUT PRESENCE OF ULCERS (sore throat, sores in mouth, or a toothache) UNUSUAL RASH, SWELLING OR PAIN  UNUSUAL VAGINAL DISCHARGE OR ITCHING   Items with * indicate a potential emergency and should be followed up as soon as possible or go to the Emergency Department if any problems should occur.  Please show the CHEMOTHERAPY ALERT CARD or IMMUNOTHERAPY ALERT CARD at check-in to the Emergency  Department and triage nurse.  Should you have questions after your visit or need to cancel or reschedule your appointment, please contact [facility]  Dept: [phone]  and follow the prompts.  Office hours are 8:00 a.m. to 4:30 p.m. Monday - Friday. Please note that voicemails left after 4:00 p.m. may not be returned until the following business day.  We are closed weekends and major holidays. You have access to a nurse at all times for urgent questions. Please call the main number to the clinic Dept: [phone] and follow the prompts.  For any non-urgent questions, you may also contact your provider using MyChart. We now offer e-Visits for anyone 18 and older to request care online for non-urgent symptoms. For details visit [email or web address].   Also download the MyChart app! Go to the app store, search "MyChart", open the app, select [facility], and log in with your MyChart username and password.  Masks are optional in the cancer centers. If you would like for your care team to wear a mask while they are taking care of you, please let them know. You may have one support person who is at least 79 years old accompany you for your appointments.

## 2022-05-23 ENCOUNTER — Inpatient Hospital Stay (INDEPENDENT_AMBULATORY_CARE_PROVIDER_SITE_OTHER): Payer: Medicare Other | Admitting: Oncology

## 2022-05-23 VITALS — BP 144/61 | HR 87 | Temp 98.0°F | Resp 16 | Ht 60.83 in | Wt 151.3 lb

## 2022-05-23 DIAGNOSIS — Y633 Inadvertent exposure of patient to radiation during medical care: Secondary | ICD-10-CM

## 2022-05-23 DIAGNOSIS — I509 Heart failure, unspecified: Secondary | ICD-10-CM | POA: Diagnosis not present

## 2022-05-23 DIAGNOSIS — N390 Urinary tract infection, site not specified: Secondary | ICD-10-CM | POA: Diagnosis not present

## 2022-05-23 DIAGNOSIS — J449 Chronic obstructive pulmonary disease, unspecified: Secondary | ICD-10-CM | POA: Diagnosis not present

## 2022-05-23 DIAGNOSIS — N1832 Chronic kidney disease, stage 3b: Secondary | ICD-10-CM

## 2022-05-23 DIAGNOSIS — C678 Malignant neoplasm of overlapping sites of bladder: Secondary | ICD-10-CM

## 2022-05-23 DIAGNOSIS — I110 Hypertensive heart disease with heart failure: Secondary | ICD-10-CM | POA: Diagnosis not present

## 2022-05-23 DIAGNOSIS — Z7189 Other specified counseling: Secondary | ICD-10-CM | POA: Diagnosis not present

## 2022-05-23 DIAGNOSIS — Z09 Encounter for follow-up examination after completed treatment for conditions other than malignant neoplasm: Secondary | ICD-10-CM | POA: Diagnosis not present

## 2022-05-23 DIAGNOSIS — C672 Malignant neoplasm of lateral wall of bladder: Secondary | ICD-10-CM | POA: Diagnosis not present

## 2022-05-23 DIAGNOSIS — R300 Dysuria: Secondary | ICD-10-CM | POA: Diagnosis not present

## 2022-05-23 DIAGNOSIS — E785 Hyperlipidemia, unspecified: Secondary | ICD-10-CM | POA: Diagnosis not present

## 2022-05-23 DIAGNOSIS — K219 Gastro-esophageal reflux disease without esophagitis: Secondary | ICD-10-CM | POA: Diagnosis not present

## 2022-05-23 DIAGNOSIS — Z510 Encounter for antineoplastic radiation therapy: Secondary | ICD-10-CM | POA: Diagnosis not present

## 2022-05-23 DIAGNOSIS — I130 Hypertensive heart and chronic kidney disease with heart failure and stage 1 through stage 4 chronic kidney disease, or unspecified chronic kidney disease: Secondary | ICD-10-CM | POA: Diagnosis not present

## 2022-05-23 DIAGNOSIS — E875 Hyperkalemia: Secondary | ICD-10-CM | POA: Diagnosis not present

## 2022-05-23 DIAGNOSIS — C679 Malignant neoplasm of bladder, unspecified: Secondary | ICD-10-CM

## 2022-05-23 DIAGNOSIS — Z5111 Encounter for antineoplastic chemotherapy: Secondary | ICD-10-CM | POA: Diagnosis not present

## 2022-05-23 LAB — CBC WITH DIFFERENTIAL (CANCER CENTER ONLY)
Abs Immature Granulocytes: 0.07 10*3/uL (ref 0.00–0.07)
Basophils Absolute: 0 10*3/uL (ref 0.0–0.1)
Basophils Relative: 0 %
Eosinophils Absolute: 0.2 10*3/uL (ref 0.0–0.5)
Eosinophils Relative: 2 %
HCT: 32.6 % — ABNORMAL LOW (ref 36.0–46.0)
Hemoglobin: 9.8 g/dL — ABNORMAL LOW (ref 12.0–15.0)
Immature Granulocytes: 1 %
Lymphocytes Relative: 20 %
Lymphs Abs: 2.1 10*3/uL (ref 0.7–4.0)
MCH: 30.6 pg (ref 26.0–34.0)
MCHC: 30.1 g/dL (ref 30.0–36.0)
MCV: 101.9 fL — ABNORMAL HIGH (ref 80.0–100.0)
Monocytes Absolute: 0.7 10*3/uL (ref 0.1–1.0)
Monocytes Relative: 7 %
Neutro Abs: 7.1 10*3/uL (ref 1.7–7.7)
Neutrophils Relative %: 70 %
Platelet Count: 386 10*3/uL (ref 150–400)
RBC: 3.2 MIL/uL — ABNORMAL LOW (ref 3.87–5.11)
RDW: 15.2 % (ref 11.5–15.5)
WBC Count: 10.2 10*3/uL (ref 4.0–10.5)
nRBC: 0 % (ref 0.0–0.2)

## 2022-05-23 LAB — CMP (CANCER CENTER ONLY)
ALT: 14 U/L (ref 0–44)
AST: 18 U/L (ref 15–41)
Albumin: 2.9 g/dL — ABNORMAL LOW (ref 3.5–5.0)
Alkaline Phosphatase: 64 U/L (ref 38–126)
Anion gap: 9 (ref 5–15)
BUN: 51 mg/dL — ABNORMAL HIGH (ref 8–23)
CO2: 22 mmol/L (ref 22–32)
Calcium: 9 mg/dL (ref 8.9–10.3)
Chloride: 107 mmol/L (ref 98–111)
Creatinine: 2.69 mg/dL — ABNORMAL HIGH (ref 0.44–1.00)
GFR, Estimated: 17 mL/min — ABNORMAL LOW (ref >60)
Glucose, Bld: 107 mg/dL — ABNORMAL HIGH (ref 70–99)
Potassium: 5.2 mmol/L — ABNORMAL HIGH (ref 3.5–5.1)
Sodium: 138 mmol/L (ref 135–145)
Total Bilirubin: 0.7 mg/dL (ref 0.3–1.2)
Total Protein: 6.5 g/dL (ref 6.5–8.1)

## 2022-05-23 NOTE — Progress Notes (Signed)
Blood count abnormal. Anemia little worse.  Liver function normal.  Kidney function abnormal, but improved.  Potassium improved down to 5.2. Larene Beach, this patient has an appt on 12/20 at 9 am with Dr. Federico Flake and with you at 12/20 at 10:20.

## 2022-05-23 NOTE — Progress Notes (Signed)
[place] [facility] Initial Visit:  Patient Care Team: Rochel Brome, MD as PCP - General (Internal Medicine) Lane Hacker, [facility] (Pharmacist) Barbee Cough, MD as Consulting Physician (Internal Medicine)  CHIEF COMPLAINTS/PURPOSE OF CONSULTATION:  Oncology History  Bladder cancer [facility])  08/05/2018 Initial Diagnosis   Bladder cancer ([place])   04/24/2022 Cancer Staging   Staging form: Urinary Bladder, AJCC 8th Edition - Clinical stage from 04/24/2022: Stage II (cT2, cN0, cM0) - Signed by Barbee Cough, MD on 04/24/2022 Histopathologic type: Transitional cell carcinoma, NOS Stage prefix: Initial diagnosis WHO/ISUP grade (low/high): High Grade Histologic grading system: 2 grade system   05/08/2022 -  Chemotherapy   Patient is on Treatment Plan : BLADDER Gemcitabine Twice Weekly + XRT       HISTORY OF PRESENTING ILLNESS: Alexandra [name] 81 y.o. female is here because of bladder cancer.   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  Superficial lesions along the right and left lateral walls of bladder.  Biopsy sites were fulgurated  September 30, 2018: Cystoscopy with bladder biopsy.  Small superficial lesions along the left lateral wall next to previous resection.  Biopsied and fulgurated  May 05, 2019 cystoscopy with bladder biopsy.  Small superficial recurrences along previous biopsy sites on lateral walls of bladder noted.  These were fulgurated  November 12, 2019 cystoscopy with bladder biopsy.  Small papillary lesion in area of right lateral wall near previous bladder resection scar..   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  Muscularis propria not present  April 27, 2021 cystoscopy with bladder biopsy.  Small papillary lesions in right posterior wall, left lateral wall and anterior wall.  Small papillary fronds at trigone.  Biopsies of the posterior wall of the bladder and bladder neck showed low-grade papillary urothelial carcinoma noninvasive   February 15, 2022: CT abdomen pelvis without contrast.  Previous left nephrectomy noted.  Mild right hydronephrosis to the level of bladder new from previous study.  Focal area of bladder wall thickening in the region of the right ureterovesicular junction raising suspicious for bladder cancer  March 15, 2022: Cystoscopy demonstrated right ureteral obstruction with hydronephrosis.  Underwent right retrograde pyelogram, dilation of ureteral stricture stent placement and TUR BT.   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  Trivial MR and TR.    March 28 2022 CMP notable for creatinine of 2.80 glucose 101 estimated GFR 16 calcium 7.2 albumin 3.1 Alk Phos 86  April 24 2022:  [facility] Medical Oncology Consult  Social:  Retired. Factory work.  Married.  Began smoking cigarettes 20 yrs of age, 1 ppd and quit 24 yrs ago.  EtOH none  [place] Mother died 45 old age Father killed when patient was 5 yrs of age Brother died 2 MI Sister alive 35 several medical problems Sister alive 8 thyroid problems.    April 25 2022:  Chemotherapy teaching  May 07 2022:  Port placed  May 08 2022:  Day 1 Gemcitabine 27 mg/m2 IV Twice Weekly with RT   May 09 2022:  Began XRT    May 10 2022:  Scheduled follow up for management of bladder cancer.  Feels well.  No dysuria, pyuria, frequency or urgency.  No nausea or emesis CT chest without contrast not yet performed.  ECOG 1.  Has gained 3 lbs.    May 11 2022:  Cycle 1 Day 4 Gemcitabine 27 mg/m2 IV twice weekly with XRT  May 15 2022:  Patient self reported having COVID.  XRT and Chemotherapy appointments held  May 22 2022:  Cycle 1 Day 4 Gemcitabine 27 mg/m2 IV twice weekly with XRT  May 23 2022:  Scheduled follow up for management of bladder cancer.  Recovered from [place].  She was fully vaccinated and had minimum of symptoms, mostly fatigue.  Has sensation of bladder fullness and frequency but no hematuria, dysuria.  To see Urologist today.  Had episode of hyperkalemia (asymptomatic).  She is on entresto and eats a lot of fruit.  Instructed her to decrease fruit intake.   CT chest without contrast, MRI abdomen, and U/S not yet performed  Review of Systems  Constitutional:  Positive for appetite change. Negative for chills, fatigue, fever and unexpected weight change.       Has bouts of nausea since cholecystectomy which interferes with appetite.  H  HENT:   Negative for hearing loss, mouth sores, nosebleeds, sore throat and trouble swallowing.   Eyes:  Negative for eye problems and icterus.       Vision changes:  None  Respiratory:  Negative for chest tightness, cough, hemoptysis, shortness of breath and wheezing.         Orthopnea:  Sleeps in lounge chair DOE:  walking up stairs  Cardiovascular:  Negative for chest pain, leg swelling and palpitations.       PND:  none Orthopnea:  none Leg swelling resolved with diuresis  Gastrointestinal:  Negative for abdominal pain, blood in stool, constipation, diarrhea, nausea and vomiting.  Endocrine:       Cold intolerance:  none Heat intolerance:  none  Genitourinary:  Positive for nocturia. Negative for difficulty urinating, dysuria, frequency  and hematuria.        Nocturia x 3 to 4  Musculoskeletal:  Negative for arthralgias, back pain, gait problem and myalgias.  Skin:  Negative for itching, rash and wound.  Neurological:  Negative for dizziness, extremity weakness, gait problem, headaches, light-headedness, numbness and speech difficulty.  Hematological:  Negative for adenopathy. Does not bruise/bleed easily.  Psychiatric/Behavioral:  Negative for suicidal ideas. The patient is not nervous/anxious.        Tosses and turns at night    MEDICAL HISTORY: Past Medical History:  Diagnosis Date   COPD (chronic obstructive pulmonary disease) (HCC)    GERD (gastroesophageal reflux disease)    Hyperlipidemia    Hyperparathyroidism, primary ([place]) 06/25/2016   Hypertension    Osteoporosis    Restless legs syndrome    RLS (restless legs syndrome)     SURGICAL HISTORY: Past Surgical History:  Procedure Laterality Date   ABDOMINAL HYSTERECTOMY     complete: menorrhagia. 81 yo   BACK SURGERY  2016   lower L 4 to L 5   bladder tack and uterus lift  10/10/2015   CHOLECYSTECTOMY     summer of 2022   IR RADIOLOGIST EVAL & MGMT  12/03/2018   IR RADIOLOGIST EVAL & MGMT  12/31/2018   PARATHYROIDECTOMY Left 07/02/2016   Procedure: LEFT INFERIOR PARATHYROIDECTOMY;  Surgeon: Armandina Gemma, MD;  Location: [facility];  Service: General;  Laterality: Left;   SHOULDER OPEN ROTATOR CUFF REPAIR  12/2019   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    SOCIAL HISTORY: Social History   Socioeconomic History   Marital status: Married    Spouse name: Herbie [place]   Number of children: 2   Years of education: 11   Highest education level: 11th grade  Occupational History   Occupation: Retired  Tobacco Use   Smoking status: Former    Packs/day: 1.00    Years: 50.00    Total pack years: 50.00    Types: Cigarettes    Quit date: 06/11/2005    Years since quitting: 16.9   Smokeless tobacco: Never  Vaping Use   Vaping Use: Never used  Substance  and Sexual Activity   Alcohol use: No   Drug use: No   Sexual activity: Yes    Partners: Male  Other Topics Concern   Not on file  Social History Narrative   Not on file   Social Determinants of Health   Financial Resource Strain: Medium Risk (05/01/2022)   Overall Financial Resource Strain (CARDIA)    Difficulty of Paying Living Expenses: Somewhat hard  Food Insecurity: No Food Insecurity (03/27/2022)   Hunger Vital Sign    Worried About Running Out of Food in the Last Year: Never true    [place] in the Last Year: Never true  Transportation Needs: No Transportation Needs (05/01/2022)   PRAPARE - Hydrologist (Medical): No    Lack of Transportation (Non-Medical): No  Physical Activity: Inactive (02/15/2022)   Exercise Vital Sign    Days of Exercise per Week: 0 days    Minutes of Exercise per Session: 0 min  Stress: No Stress Concern Present (02/15/2022)   [place]    Feeling of Stress : Only a little  Social Connections: Moderately Integrated (02/15/2022)   Social Connection and Isolation Panel [NHANES]    Frequency of Communication with Friends and Family: More than three times a week    Frequency of Social Gatherings with Friends and Family: More than three times a week    Attends Religious Services: More than 4 times per year    Active Member of [employer] or Organizations: No    Attends Archivist Meetings: Never    Marital Status: Married  Human resources officer Violence: Not At Risk (02/15/2022)   Humiliation, Afraid, Rape, and Kick questionnaire    Fear of Current or Ex-Partner: No    Emotionally Abused: No    Physically Abused: No    Sexually Abused: No    FAMILY HISTORY Family History  Problem Relation Age of Onset   Emphysema Mother    Hyperlipidemia Sister    Hypertension Sister    COPD Sister    Hyperlipidemia Sister    Hypertension Sister    Lymphoma  Sister    Obesity Brother    Hyperlipidemia Brother    Hypertension Brother    Breast cancer Neg Hx     ALLERGIES:  is allergic to contrast media [iodinated contrast media], omeprazole, shellfish allergy, shellfish-derived products, and zofran [ondansetron hcl].  MEDICATIONS:  Current Outpatient Medications  Medication Sig Dispense Refill   atorvastatin (LIPITOR) 40 MG tablet Take 1 tablet (40 mg total) by mouth daily. Per  ccm 90 tablet 3   Budeson-Glycopyrrol-Formoterol (BREZTRI AEROSPHERE) 160-9-4.8 MCG/ACT AERO Inhale 2 puffs into the lungs in the morning and at bedtime. 10.7 g 6   metoprolol succinate (TOPROL-XL) 25 MG 24 hr tablet Take 25 mg by mouth daily.     prochlorperazine (COMPAZINE) 10 MG tablet Take 1 tablet (10 mg total) by mouth every 6 (six) hours as needed for nausea or vomiting. 30 tablet 1   rOPINIRole (REQUIP) 2 MG tablet TAKE 1 TABLET(2 MG) BY MOUTH IN THE MORNING AND AT BEDTIME 180 tablet 6   sacubitril-valsartan (ENTRESTO) 24-26 MG Take 1 tablet by mouth 2 (two) times daily. 60 tablet 6   sodium bicarbonate 650 MG tablet Take 650 mg by mouth 3 (three) times daily.     torsemide (DEMADEX) 20 MG tablet Take 1 tablet (20 mg total) by mouth daily. 30 tablet 3   VENTOLIN HFA 108 (90 Base) MCG/ACT inhaler      No current facility-administered medications for this visit.    PHYSICAL EXAMINATION:  ECOG PERFORMANCE STATUS: 1 - Symptomatic but completely ambulatory   Vitals:   05/23/22 0934  BP: (!) 144/61  Pulse: 87  Resp: 16  Temp: 98 F (36.7 C)  SpO2: 100%     Filed Weights   05/23/22 0934  Weight: 151 lb 4.8 oz (68.6 kg)      Physical Exam Vitals and nursing note reviewed.  Constitutional:      General: She is not in acute distress.    Appearance: Normal appearance. She is normal weight. She is not ill-appearing, toxic-appearing or diaphoretic.     Comments: Here alone.  Elderly but looks younger than age  HENT:     Head: Normocephalic and  atraumatic.     Right Ear: External ear normal.     Left Ear: External ear normal.     Nose: Nose normal. No congestion or rhinorrhea.  Eyes:     General: No scleral icterus.    Extraocular Movements: Extraocular movements intact.     Conjunctiva/sclera: Conjunctivae normal.     Pupils: Pupils are equal, round, and reactive to light.  Cardiovascular:     Rate and Rhythm: Normal rate and regular rhythm.     Heart sounds: No murmur heard.    No friction rub. No gallop.  Pulmonary:     Effort: Pulmonary effort is normal. No respiratory distress.     Breath sounds: Normal breath sounds. No stridor. No wheezing, rhonchi or rales.  Abdominal:     General: Bowel sounds are normal.     Palpations: Abdomen is soft.     Tenderness: There is no abdominal tenderness. There is no guarding or rebound.  Musculoskeletal:        General: No swelling, tenderness or deformity.     Cervical back: Normal range of motion and neck supple. No rigidity or tenderness.     Right lower leg: Edema present.     Left lower leg: Edema present.  Lymphadenopathy:     Head:     Right side of head: No submental, submandibular, tonsillar, preauricular, posterior auricular or occipital adenopathy.     Left side of head: No submental, submandibular, tonsillar, preauricular, posterior auricular or occipital adenopathy.     Cervical: No cervical adenopathy.     Right cervical: No superficial, deep or posterior cervical adenopathy.    Left cervical: No superficial, deep or posterior cervical adenopathy.     Upper Body:     Right upper body:  No supraclavicular, axillary, pectoral or epitrochlear adenopathy.     Left upper body: No supraclavicular, axillary, pectoral or epitrochlear adenopathy.  Skin:    General: Skin is warm.     Coloration: Skin is not jaundiced or pale.     Findings: No bruising or erythema.  Neurological:     General: No focal deficit present.     Mental Status: She is alert and oriented to person,  place, and time.     Cranial Nerves: No cranial nerve deficit.     Gait: Gait normal.  Psychiatric:        Mood and Affect: Mood normal.        Behavior: Behavior normal.        Thought Content: Thought content normal.        Judgment: Judgment normal.      LABORATORY DATA: I have personally reviewed the data as listed:  Appointment on 05/21/2022  Component Date Value Ref Range Status   WBC Count 05/21/2022 10.2  4.0 - 10.5 K/uL Final   RBC 05/21/2022 3.29 (L)  3.87 - 5.11 MIL/uL Final   Hemoglobin 05/21/2022 10.2 (L)  12.0 - 15.0 g/dL Final   HCT 05/21/2022 33.6 (L)  36.0 - 46.0 % Final   MCV 05/21/2022 102.1 (H)  80.0 - 100.0 fL Final   MCH 05/21/2022 31.0  26.0 - 34.0 pg Final   MCHC 05/21/2022 30.4  30.0 - 36.0 g/dL Final   RDW 05/21/2022 15.3  11.5 - 15.5 % Final   Platelet Count 05/21/2022 329  150 - 400 K/uL Final   nRBC 05/21/2022 0.0  0.0 - 0.2 % Final   Neutrophils Relative % 05/21/2022 65  % Final   Neutro Abs 05/21/2022 6.7  1.7 - 7.7 K/uL Final   Lymphocytes Relative 05/21/2022 23  % Final   Lymphs Abs 05/21/2022 2.3  0.7 - 4.0 K/uL Final   Monocytes Relative 05/21/2022 8  % Final   Monocytes Absolute 05/21/2022 0.8  0.1 - 1.0 K/uL Final   Eosinophils Relative 05/21/2022 2  % Final   Eosinophils Absolute 05/21/2022 0.2  0.0 - 0.5 K/uL Final   Basophils Relative 05/21/2022 0  % Final   Basophils Absolute 05/21/2022 0.0  0.0 - 0.1 K/uL Final   Immature Granulocytes 05/21/2022 2  % Final   Abs Immature Granulocytes 05/21/2022 0.17 (H)  0.00 - 0.07 K/uL Final   Performed at [facility], [place] [address] [place], [place] [address]   Sodium 05/21/2022 140  135 - 145 mmol/L Final   Potassium 05/21/2022 6.0 (H)  3.5 - 5.1 mmol/L Final   Chloride 05/21/2022 109  98 - 111 mmol/L Final   CO2 05/21/2022 24  22 - 32 mmol/L Final   Glucose, Bld 05/21/2022 113 (H)  70 - 99 mg/dL Final   Glucose reference range applies only to samples taken after fasting  for at least 8 hours.   BUN 05/21/2022 42 (H)  8 - 23 mg/dL Final   Creatinine 05/21/2022 2.62 (H)  0.44 - 1.00 mg/dL Final   Calcium 05/21/2022 9.2  8.9 - 10.3 mg/dL Final   Total Protein 05/21/2022 6.6  6.5 - 8.1 g/dL Final   Albumin 05/21/2022 3.2 (L)  3.5 - 5.0 g/dL Final   AST 05/21/2022 21  15 - 41 U/L Final   ALT 05/21/2022 18  0 - 44 U/L Final   Alkaline Phosphatase 05/21/2022 73  38 - 126 U/L Final   Total Bilirubin  05/21/2022 0.6  0.3 - 1.2 mg/dL Final   GFR, Estimated 05/21/2022 18 (L)  >60 mL/min Final   Comment: (NOTE) Calculated using the CKD-EPI Creatinine Equation (2021)    Anion gap 05/21/2022 7  5 - 15 Final   Performed at [facility], [place] [address], [place], [place] [address]  Appointment on 05/02/2022  Component Date Value Ref Range Status   WBC Count 05/02/2022 10.4  4.0 - 10.5 K/uL Final   RBC 05/02/2022 3.50 (L)  3.87 - 5.11 MIL/uL Final   Hemoglobin 05/02/2022 10.5 (L)  12.0 - 15.0 g/dL Final   HCT 05/02/2022 35.4 (L)  36.0 - 46.0 % Final   MCV 05/02/2022 101.1 (H)  80.0 - 100.0 fL Final   MCH 05/02/2022 30.0  26.0 - 34.0 pg Final   MCHC 05/02/2022 29.7 (L)  30.0 - 36.0 g/dL Final   RDW 05/02/2022 14.6  11.5 - 15.5 % Final   Platelet Count 05/02/2022 394  150 - 400 K/uL Final   nRBC 05/02/2022 0.0  0.0 - 0.2 % Final   Neutrophils Relative % 05/02/2022 63  % Final   Neutro Abs 05/02/2022 6.6  1.7 - 7.7 K/uL Final   Lymphocytes Relative 05/02/2022 24  % Final   Lymphs Abs 05/02/2022 2.5  0.7 - 4.0 K/uL Final   Monocytes Relative 05/02/2022 7  % Final   Monocytes Absolute 05/02/2022 0.8  0.1 - 1.0 K/uL Final   Eosinophils Relative 05/02/2022 3  % Final   Eosinophils Absolute 05/02/2022 0.3  0.0 - 0.5 K/uL Final   Basophils Relative 05/02/2022 1  % Final   Basophils Absolute 05/02/2022 0.1  0.0 - 0.1 K/uL Final   Immature Granulocytes 05/02/2022 2  % Final   Abs Immature Granulocytes 05/02/2022 0.17 (H)  0.00 - 0.07 K/uL Final    Performed at [facility], [place] [address] [place], [place] [address]   Sodium 05/02/2022 141  135 - 145 mmol/L Final   Potassium 05/02/2022 4.6  3.5 - 5.1 mmol/L Final   Chloride 05/02/2022 110  98 - 111 mmol/L Final   CO2 05/02/2022 23  22 - 32 mmol/L Final   Glucose, Bld 05/02/2022 111 (H)  70 - 99 mg/dL Final   Glucose reference range applies only to samples taken after fasting for at least 8 hours.   BUN 05/02/2022 65 (H)  8 - 23 mg/dL Final   Creatinine 05/02/2022 2.86 (H)  0.44 - 1.00 mg/dL Final   Calcium 05/02/2022 9.1  8.9 - 10.3 mg/dL Final   Total Protein 05/02/2022 7.0  6.5 - 8.1 g/dL Final   Albumin 05/02/2022 3.6  3.5 - 5.0 g/dL Final   AST 05/02/2022 23  15 - 41 U/L Final   ALT 05/02/2022 19  0 - 44 U/L Final   Alkaline Phosphatase 05/02/2022 84  38 - 126 U/L Final   Total Bilirubin 05/02/2022 0.5  0.3 - 1.2 mg/dL Final   GFR, Estimated 05/02/2022 16 (L)  >60 mL/min Final   Comment: (NOTE) Calculated using the CKD-EPI Creatinine Equation (2021)    Anion gap 05/02/2022 8  5 - 15 Final   Performed at [facility], [place] [address] [place], [place] [address]    RADIOGRAPHIC STUDIES: I have personally reviewed the radiological images as listed and agree with the findings in the report  No results found.  ASSESSMENT/PLAN  81 y.o. female with medical history notable for COPD, GERD, hyperlipidemia, hyperparathyroidism, hypertension, osteoporosis,  restless leg syndrome for chronic kidney disease, CT contrast allergy, gout, CKD, congestive heart failure.  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  Focal area of bladder wall thickening in the region of the right ureterovesicular junction raising suspicious for bladder cancer  October 2023:  Cystoscopy. Right ureteral obstruction with hydronephrosis.   Stent placed.  Biopsied.     Will reschedule uncontrasted CT chest, renal U/S and MRI as these were likely affected by recent covid dx  Major risk factor is long history of tobacco use  Renal cell carcinoma, Left kidney:  Treated with nephrectomy in 2003  Chronic Kidney Disease:  Multifactorial with largest component being nephrectomy.  Contributing factors are HTN, age, CHF.    Therapeutics:  Dictated by age, comorbidities especially CKD which excludes consideration of cystectomy and platinum.  Has been evaluated by Radiation Oncology.    Has received chemotherapy teaching May 08 2022:  Day 1 Gemcitabine 27 mg/m2 IV Twice Weekly with RT  Tolerating treatment well but will follow closely to minimize side effects Treatment was interrupted due to self limited COVID infection  Hyperkalemia:  Secondary to CKD, entresto and diet.  Discussed management.  Will check CMP today  Poor venous access:  Has undergone port placement.   Pelvic pressure:  Likely related to XRT + chemotherapy.  To be seen by urology today  History of kidney and bladder cancer:  Refered to genetics.    Cancer Staging  Bladder cancer [facility]) Staging form: Urinary Bladder, AJCC 8th Edition - Clinical stage from 04/24/2022: Stage II (cT2, cN0, cM0) - Signed by Barbee Cough, MD on 04/24/2022 Histopathologic type: Transitional cell carcinoma, NOS Stage prefix: Initial diagnosis WHO/ISUP grade (low/high): High Grade Histologic grading system: 2 grade system    No problem-specific Assessment & Plan notes found for this encounter.   No orders of the defined types were placed in this encounter.   All  questions were answered. The patient knows to call the clinic with any problems, questions or concerns.  30 minutes was spent in face to face time  This note was electronically signed.    Barbee Cough, MD  05/23/2022 9:39 AM

## 2022-05-24 DIAGNOSIS — C672 Malignant neoplasm of lateral wall of bladder: Secondary | ICD-10-CM | POA: Diagnosis not present

## 2022-05-24 DIAGNOSIS — K219 Gastro-esophageal reflux disease without esophagitis: Secondary | ICD-10-CM | POA: Diagnosis not present

## 2022-05-24 DIAGNOSIS — J449 Chronic obstructive pulmonary disease, unspecified: Secondary | ICD-10-CM | POA: Diagnosis not present

## 2022-05-24 DIAGNOSIS — Z510 Encounter for antineoplastic radiation therapy: Secondary | ICD-10-CM | POA: Diagnosis not present

## 2022-05-24 DIAGNOSIS — E785 Hyperlipidemia, unspecified: Secondary | ICD-10-CM | POA: Diagnosis not present

## 2022-05-24 DIAGNOSIS — I110 Hypertensive heart disease with heart failure: Secondary | ICD-10-CM | POA: Diagnosis not present

## 2022-05-25 ENCOUNTER — Inpatient Hospital Stay: Payer: Medicare Other

## 2022-05-25 VITALS — BP 146/65 | HR 76 | Temp 98.0°F | Resp 18 | Ht 60.83 in | Wt 150.0 lb

## 2022-05-25 DIAGNOSIS — I110 Hypertensive heart disease with heart failure: Secondary | ICD-10-CM | POA: Diagnosis not present

## 2022-05-25 DIAGNOSIS — I130 Hypertensive heart and chronic kidney disease with heart failure and stage 1 through stage 4 chronic kidney disease, or unspecified chronic kidney disease: Secondary | ICD-10-CM | POA: Diagnosis not present

## 2022-05-25 DIAGNOSIS — Z510 Encounter for antineoplastic radiation therapy: Secondary | ICD-10-CM | POA: Diagnosis not present

## 2022-05-25 DIAGNOSIS — R300 Dysuria: Secondary | ICD-10-CM | POA: Diagnosis not present

## 2022-05-25 DIAGNOSIS — C678 Malignant neoplasm of overlapping sites of bladder: Secondary | ICD-10-CM

## 2022-05-25 DIAGNOSIS — C672 Malignant neoplasm of lateral wall of bladder: Secondary | ICD-10-CM | POA: Diagnosis not present

## 2022-05-25 DIAGNOSIS — Z5111 Encounter for antineoplastic chemotherapy: Secondary | ICD-10-CM | POA: Diagnosis not present

## 2022-05-25 DIAGNOSIS — E785 Hyperlipidemia, unspecified: Secondary | ICD-10-CM | POA: Diagnosis not present

## 2022-05-25 DIAGNOSIS — K219 Gastro-esophageal reflux disease without esophagitis: Secondary | ICD-10-CM | POA: Diagnosis not present

## 2022-05-25 DIAGNOSIS — J449 Chronic obstructive pulmonary disease, unspecified: Secondary | ICD-10-CM | POA: Diagnosis not present

## 2022-05-25 DIAGNOSIS — I509 Heart failure, unspecified: Secondary | ICD-10-CM | POA: Diagnosis not present

## 2022-05-25 MED ORDER — SODIUM CHLORIDE 0.9 % IV SOLN
27.0000 mg/m2 | Freq: Once | INTRAVENOUS | Status: AC
  Administered 2022-05-25: 38 mg via INTRAVENOUS
  Filled 2022-05-25: qty 1

## 2022-05-25 MED ORDER — SODIUM CHLORIDE 0.9 % IV SOLN: Freq: Once | INTRAVENOUS | Status: AC

## 2022-05-25 MED ORDER — HEPARIN SOD (PORK) LOCK FLUSH 100 UNIT/ML IV SOLN
500.0000 [IU] | Freq: Once | INTRAVENOUS | Status: AC | PRN
  Administered 2022-05-25: 500 [IU]

## 2022-05-25 MED ORDER — PROCHLORPERAZINE MALEATE 10 MG PO TABS: 10.0000 mg | ORAL_TABLET | Freq: Once | ORAL | Status: DC

## 2022-05-25 MED ORDER — SODIUM CHLORIDE 0.9% FLUSH
10.0000 mL | INTRAVENOUS | Status: DC | PRN
  Administered 2022-05-25: 10 mL

## 2022-05-25 NOTE — Addendum Note (Signed)
Addended by: Wanita Chamberlain on: 05/25/2022 10:29 AM   Modules accepted: Orders

## 2022-05-25 NOTE — Patient Instructions (Signed)
Gemcitabine Injection What is this medication? GEMCITABINE (jem SYE ta been) treats some types of cancer. It works by slowing down the growth of cancer cells. This medicine may be used for other purposes; ask your health care provider or pharmacist if you have questions. COMMON BRAND NAME(S): Gemzar, Infugem What should I tell my care team before I take this medication? They need to know if you have any of these conditions: Blood disorders Infection Kidney disease Liver disease Lung or breathing disease, such as asthma or COPD Recent or ongoing radiation therapy An unusual or allergic reaction to gemcitabine, other medications, foods, dyes, or preservatives If you or your partner are pregnant or trying to get pregnant Breast-feeding How should I use this medication? This medication is injected into a vein. It is given by your care team in a hospital or clinic setting. Talk to your care team about the use of this medication in children. Special care may be needed. Overdosage: If you think you have taken too much of this medicine contact a poison control center or emergency room at once. NOTE: This medicine is only for you. Do not share this medicine with others. What if I miss a dose? Keep appointments for follow-up doses. It is important not to miss your dose. Call your care team if you are unable to keep an appointment. What may interact with this medication? Interactions have not been studied. This list may not describe all possible interactions. Give your health care provider a list of all the medicines, herbs, non-prescription drugs, or dietary supplements you use. Also tell them if you smoke, drink alcohol, or use illegal drugs. Some items may interact with your medicine. What should I watch for while using this medication? Your condition will be monitored carefully while you are receiving this medication. This medication may make you feel generally unwell. This is not uncommon, as  chemotherapy can affect healthy cells as well as cancer cells. Report any side effects. Continue your course of treatment even though you feel ill unless your care team tells you to stop. In some cases, you may be given additional medications to help with side effects. Follow all directions for their use. This medication may increase your risk of getting an infection. Call your care team for advice if you get a fever, chills, sore throat, or other symptoms of a cold or flu. Do not treat yourself. Try to avoid being around people who are sick. This medication may increase your risk to bruise or bleed. Call your care team if you notice any unusual bleeding. Be careful brushing or flossing your teeth or using a toothpick because you may get an infection or bleed more easily. If you have any dental work done, tell your dentist you are receiving this medication. Avoid taking medications that contain aspirin, acetaminophen, ibuprofen, naproxen, or ketoprofen unless instructed by your care team. These medications may hide a fever. Talk to your care team if you or your partner wish to become pregnant or think you might be pregnant. This medication can cause serious birth defects if taken during pregnancy and for 6 months after the last dose. A negative pregnancy test is required before starting this medication. A reliable form of contraception is recommended while taking this medication and for 6 months after the last dose. Talk to your care team about effective forms of contraception. Do not father a child while taking this medication and for 3 months after the last dose. Use a condom while having sex  during this time period. Do not breastfeed while taking this medication and for at least 1 week after the last dose. This medication may cause infertility. Talk to your care team if you are concerned about your fertility. What side effects may I notice from receiving this medication? Side effects that you should  report to your care team as soon as possible: Allergic reactions--skin rash, itching, hives, swelling of the face, lips, tongue, or throat Capillary leak syndrome--stomach or muscle pain, unusual weakness or fatigue, feeling faint or lightheaded, decrease in the amount of urine, swelling of the ankles, hands, or feet, trouble breathing Infection--fever, chills, cough, sore throat, wounds that don't heal, pain or trouble when passing urine, general feeling of discomfort or being unwell Liver injury--right upper belly pain, loss of appetite, nausea, light-colored stool, dark yellow or brown urine, yellowing skin or eyes, unusual weakness or fatigue Low red blood cell level--unusual weakness or fatigue, dizziness, headache, trouble breathing Lung injury--shortness of breath or trouble breathing, cough, spitting up blood, chest pain, fever Stomach pain, bloody diarrhea, pale skin, unusual weakness or fatigue, decrease in the amount of urine, which may be signs of hemolytic uremic syndrome Sudden and severe headache, confusion, change in vision, seizures, which may be signs of posterior reversible encephalopathy syndrome (PRES) Unusual bruising or bleeding Side effects that usually do not require medical attention (report to your care team if they continue or are bothersome): Diarrhea Drowsiness Hair loss Nausea Pain, redness, or swelling with sores inside the mouth or throat Vomiting This list may not describe all possible side effects. Call your doctor for medical advice about side effects. You may report side effects to FDA at 1-800-FDA-1088. Where should I keep my medication? This medication is given in a hospital or clinic. It will not be stored at home. NOTE: This sheet is a summary. It may not cover all possible information. If you have questions about this medicine, talk to your doctor, pharmacist, or health care provider.  2023 Elsevier/Gold Standard (2007-07-19 00:00:00)  [facility]  Discharge Instructions: Thank you for choosing [facility] to provide your oncology and hematology care.  If you have a lab appointment with the [facility], please go directly to the [facility] and check in at the registration area.   Wear comfortable clothing and clothing appropriate for easy access to any Portacath or PICC line.   We strive to give you quality time with your provider. You may need to reschedule your appointment if you arrive late (15 or more minutes).  Arriving late affects you and other patients whose appointments are after yours.  Also, if you miss three or more appointments without notifying the office, you may be dismissed from the clinic at the provider's discretion.      For prescription refill requests, have your pharmacy contact our office and allow 72 hours for refills to be completed.    Today you received the following chemotherapy and/or immunotherapy agents GEMCITABINE      To help prevent nausea and vomiting after your treatment, we encourage you to take your nausea medication as directed.  BELOW ARE SYMPTOMS THAT SHOULD BE REPORTED IMMEDIATELY: *FEVER GREATER THAN 100.4 F (38 C) OR HIGHER *CHILLS OR SWEATING *NAUSEA AND VOMITING THAT IS NOT CONTROLLED WITH YOUR NAUSEA MEDICATION *UNUSUAL SHORTNESS OF BREATH *UNUSUAL BRUISING OR BLEEDING *URINARY PROBLEMS (pain or burning when urinating, or frequent urination) *BOWEL PROBLEMS (unusual diarrhea, constipation, pain near the anus) TENDERNESS IN MOUTH  AND THROAT WITH OR WITHOUT PRESENCE OF ULCERS (sore throat, sores in mouth, or a toothache) UNUSUAL RASH, SWELLING OR PAIN  UNUSUAL VAGINAL DISCHARGE OR ITCHING   Items with * indicate a potential emergency and should be followed up as soon as possible or go to the Emergency Department if any problems should occur.  Please show the CHEMOTHERAPY ALERT CARD or IMMUNOTHERAPY ALERT CARD at check-in to the Emergency  Department and triage nurse.  Should you have questions after your visit or need to cancel or reschedule your appointment, please contact [facility]  Dept: [phone]  and follow the prompts.  Office hours are 8:00 a.m. to 4:30 p.m. Monday - Friday. Please note that voicemails left after 4:00 p.m. may not be returned until the following business day.  We are closed weekends and major holidays. You have access to a nurse at all times for urgent questions. Please call the main number to the clinic Dept: [phone] and follow the prompts.  For any non-urgent questions, you may also contact your provider using MyChart. We now offer e-Visits for anyone 108 and older to request care online for non-urgent symptoms. For details visit [email or web address].   Also download the MyChart app! Go to the app store, search "MyChart", open the app, select [facility], and log in with your MyChart username and password.  Masks are optional in the cancer centers. If you would like for your care team to wear a mask while they are taking care of you, please let them know. You may have one support person who is at least 80 years old accompany you for your appointments.

## 2022-05-28 DIAGNOSIS — E785 Hyperlipidemia, unspecified: Secondary | ICD-10-CM | POA: Diagnosis not present

## 2022-05-28 DIAGNOSIS — Z510 Encounter for antineoplastic radiation therapy: Secondary | ICD-10-CM | POA: Diagnosis not present

## 2022-05-28 DIAGNOSIS — C672 Malignant neoplasm of lateral wall of bladder: Secondary | ICD-10-CM | POA: Diagnosis not present

## 2022-05-28 DIAGNOSIS — K219 Gastro-esophageal reflux disease without esophagitis: Secondary | ICD-10-CM | POA: Diagnosis not present

## 2022-05-28 DIAGNOSIS — J449 Chronic obstructive pulmonary disease, unspecified: Secondary | ICD-10-CM | POA: Diagnosis not present

## 2022-05-28 DIAGNOSIS — I110 Hypertensive heart disease with heart failure: Secondary | ICD-10-CM | POA: Diagnosis not present

## 2022-05-29 ENCOUNTER — Ambulatory Visit: Payer: Medicare Other | Admitting: Dietician

## 2022-05-29 ENCOUNTER — Inpatient Hospital Stay: Payer: Medicare Other

## 2022-05-29 VITALS — BP 137/54 | HR 73 | Temp 98.8°F | Resp 18 | Ht 60.83 in | Wt 155.5 lb

## 2022-05-29 DIAGNOSIS — E785 Hyperlipidemia, unspecified: Secondary | ICD-10-CM | POA: Diagnosis not present

## 2022-05-29 DIAGNOSIS — C678 Malignant neoplasm of overlapping sites of bladder: Secondary | ICD-10-CM

## 2022-05-29 DIAGNOSIS — Z510 Encounter for antineoplastic radiation therapy: Secondary | ICD-10-CM | POA: Diagnosis not present

## 2022-05-29 DIAGNOSIS — I110 Hypertensive heart disease with heart failure: Secondary | ICD-10-CM | POA: Diagnosis not present

## 2022-05-29 DIAGNOSIS — C672 Malignant neoplasm of lateral wall of bladder: Secondary | ICD-10-CM | POA: Diagnosis not present

## 2022-05-29 DIAGNOSIS — I130 Hypertensive heart and chronic kidney disease with heart failure and stage 1 through stage 4 chronic kidney disease, or unspecified chronic kidney disease: Secondary | ICD-10-CM | POA: Diagnosis not present

## 2022-05-29 DIAGNOSIS — J449 Chronic obstructive pulmonary disease, unspecified: Secondary | ICD-10-CM | POA: Diagnosis not present

## 2022-05-29 DIAGNOSIS — R300 Dysuria: Secondary | ICD-10-CM | POA: Diagnosis not present

## 2022-05-29 DIAGNOSIS — Z5111 Encounter for antineoplastic chemotherapy: Secondary | ICD-10-CM | POA: Diagnosis not present

## 2022-05-29 DIAGNOSIS — I509 Heart failure, unspecified: Secondary | ICD-10-CM | POA: Diagnosis not present

## 2022-05-29 DIAGNOSIS — K219 Gastro-esophageal reflux disease without esophagitis: Secondary | ICD-10-CM | POA: Diagnosis not present

## 2022-05-29 MED ORDER — PROCHLORPERAZINE MALEATE 10 MG PO TABS: 10.0000 mg | ORAL_TABLET | Freq: Once | ORAL | Status: DC

## 2022-05-29 MED ORDER — SODIUM CHLORIDE 0.9 % IV SOLN
27.0000 mg/m2 | Freq: Once | INTRAVENOUS | Status: AC
  Administered 2022-05-29: 38 mg via INTRAVENOUS
  Filled 2022-05-29 (×2): qty 1

## 2022-05-29 MED ORDER — SODIUM CHLORIDE 0.9% FLUSH
10.0000 mL | INTRAVENOUS | Status: DC | PRN
  Administered 2022-05-29: 10 mL

## 2022-05-29 MED ORDER — HEPARIN SOD (PORK) LOCK FLUSH 100 UNIT/ML IV SOLN
500.0000 [IU] | Freq: Once | INTRAVENOUS | Status: AC | PRN
  Administered 2022-05-29: 500 [IU]

## 2022-05-29 MED ORDER — SODIUM CHLORIDE 0.9 % IV SOLN: Freq: Once | INTRAVENOUS | Status: AC

## 2022-05-29 NOTE — Progress Notes (Signed)
Nutrition Assessment: Met with patient during her infusion.    Reason for Assessment: New Patient Assessment   ASSESSMENT: Patient is a 81 year old female with bladder cancer currently getting concurrent chemo radiation. Her chemo treatment is Gemcitabine twice weekly.  She has a PMHx that includes GERD, HTN, osteoporosis, CKD. She reports she has been eating 3 meals per day and not having any NIS at this time. She has some nausea but it is rare.  She reports no food allergies. Usual intake includes: Eggs with toast Mac & cheese or frozen dinners Soups at night for supper Has Activia yogurt daily, and Ensure plus BID Other fluids, coffee (16 oz), white grape or cranberry juice (8 oz), water (32 oz) .    Nutrition Focused Physical Exam: unable to perform NFPE   Medications: Torsemide, compazine   Labs: 05/23/22  GFR 17, K+ 5.2, BUN 51 Creat 2.69   Anthropometrics: some gradual weight loss 10# (6%) not significant past 6 months  Height: 60"  Weight: 155.5#  BMI: 29.55   Estimated Energy Needs  Kcals: 2100-2400 Protein: 70-84 Fluid: 2.5 L    INTERVENTION:   Relayed that nutrition services are wrap around service provided at no charge and encouraged continued communication if experiencing any nutritional impact symptoms (NIS). Educated on importance of adequate nourishment with calorie and protein energy intake  with nutrient dense foods when possible to maintain weight/strength and QOL.   Suggested continued use of ONS gave coupons for Ensure Provided Nutrition Tip sheet  for  Nutrition During Cancer Treatment, Tips for Nausea Contact information provided.  MONITORING, EVALUATION, GOAL: weight trends, nutrition impact symptoms, PO intake, labs  Goal is weight maintenance  Next Visit: PRN at patient or provider request.  April Manson, RDN, LDN Registered Dietitian, [facility] (Usual office hours: Tuesday-Thursday) Mobile:  [phone]

## 2022-05-29 NOTE — Patient Instructions (Signed)
Gemcitabine Injection What is this medication? GEMCITABINE (jem SYE ta been) treats some types of cancer. It works by slowing down the growth of cancer cells. This medicine may be used for other purposes; ask your health care provider or pharmacist if you have questions. COMMON BRAND NAME(S): Gemzar, Infugem What should I tell my care team before I take this medication? They need to know if you have any of these conditions: Blood disorders Infection Kidney disease Liver disease Lung or breathing disease, such as asthma or COPD Recent or ongoing radiation therapy An unusual or allergic reaction to gemcitabine, other medications, foods, dyes, or preservatives If you or your partner are pregnant or trying to get pregnant Breast-feeding How should I use this medication? This medication is injected into a vein. It is given by your care team in a hospital or clinic setting. Talk to your care team about the use of this medication in children. Special care may be needed. Overdosage: If you think you have taken too much of this medicine contact a poison control center or emergency room at once. NOTE: This medicine is only for you. Do not share this medicine with others. What if I miss a dose? Keep appointments for follow-up doses. It is important not to miss your dose. Call your care team if you are unable to keep an appointment. What may interact with this medication? Interactions have not been studied. This list may not describe all possible interactions. Give your health care provider a list of all the medicines, herbs, non-prescription drugs, or dietary supplements you use. Also tell them if you smoke, drink alcohol, or use illegal drugs. Some items may interact with your medicine. What should I watch for while using this medication? Your condition will be monitored carefully while you are receiving this medication. This medication may make you feel generally unwell. This is not uncommon, as  chemotherapy can affect healthy cells as well as cancer cells. Report any side effects. Continue your course of treatment even though you feel ill unless your care team tells you to stop. In some cases, you may be given additional medications to help with side effects. Follow all directions for their use. This medication may increase your risk of getting an infection. Call your care team for advice if you get a fever, chills, sore throat, or other symptoms of a cold or flu. Do not treat yourself. Try to avoid being around people who are sick. This medication may increase your risk to bruise or bleed. Call your care team if you notice any unusual bleeding. Be careful brushing or flossing your teeth or using a toothpick because you may get an infection or bleed more easily. If you have any dental work done, tell your dentist you are receiving this medication. Avoid taking medications that contain aspirin, acetaminophen, ibuprofen, naproxen, or ketoprofen unless instructed by your care team. These medications may hide a fever. Talk to your care team if you or your partner wish to become pregnant or think you might be pregnant. This medication can cause serious birth defects if taken during pregnancy and for 6 months after the last dose. A negative pregnancy test is required before starting this medication. A reliable form of contraception is recommended while taking this medication and for 6 months after the last dose. Talk to your care team about effective forms of contraception. Do not father a child while taking this medication and for 3 months after the last dose. Use a condom while having sex  during this time period. Do not breastfeed while taking this medication and for at least 1 week after the last dose. This medication may cause infertility. Talk to your care team if you are concerned about your fertility. What side effects may I notice from receiving this medication? Side effects that you should  report to your care team as soon as possible: Allergic reactions--skin rash, itching, hives, swelling of the face, lips, tongue, or throat Capillary leak syndrome--stomach or muscle pain, unusual weakness or fatigue, feeling faint or lightheaded, decrease in the amount of urine, swelling of the ankles, hands, or feet, trouble breathing Infection--fever, chills, cough, sore throat, wounds that don't heal, pain or trouble when passing urine, general feeling of discomfort or being unwell Liver injury--right upper belly pain, loss of appetite, nausea, light-colored stool, dark yellow or brown urine, yellowing skin or eyes, unusual weakness or fatigue Low red blood cell level--unusual weakness or fatigue, dizziness, headache, trouble breathing Lung injury--shortness of breath or trouble breathing, cough, spitting up blood, chest pain, fever Stomach pain, bloody diarrhea, pale skin, unusual weakness or fatigue, decrease in the amount of urine, which may be signs of hemolytic uremic syndrome Sudden and severe headache, confusion, change in vision, seizures, which may be signs of posterior reversible encephalopathy syndrome (PRES) Unusual bruising or bleeding Side effects that usually do not require medical attention (report to your care team if they continue or are bothersome): Diarrhea Drowsiness Hair loss Nausea Pain, redness, or swelling with sores inside the mouth or throat Vomiting This list may not describe all possible side effects. Call your doctor for medical advice about side effects. You may report side effects to FDA at 1-800-FDA-1088. Where should I keep my medication? This medication is given in a hospital or clinic. It will not be stored at home. NOTE: This sheet is a summary. It may not cover all possible information. If you have questions about this medicine, talk to your doctor, pharmacist, or health care provider.  2023 Elsevier/Gold Standard (2007-07-19 00:00:00)  [facility]  Discharge Instructions: Thank you for choosing [facility] to provide your oncology and hematology care.  If you have a lab appointment with the [facility], please go directly to the [facility] and check in at the registration area.   Wear comfortable clothing and clothing appropriate for easy access to any Portacath or PICC line.   We strive to give you quality time with your provider. You may need to reschedule your appointment if you arrive late (15 or more minutes).  Arriving late affects you and other patients whose appointments are after yours.  Also, if you miss three or more appointments without notifying the office, you may be dismissed from the clinic at the provider's discretion.      For prescription refill requests, have your pharmacy contact our office and allow 72 hours for refills to be completed.    Today you received the following chemotherapy and/or immunotherapy agents gemcitabine      To help prevent nausea and vomiting after your treatment, we encourage you to take your nausea medication as directed.  BELOW ARE SYMPTOMS THAT SHOULD BE REPORTED IMMEDIATELY: *FEVER GREATER THAN 100.4 F (38 C) OR HIGHER *CHILLS OR SWEATING *NAUSEA AND VOMITING THAT IS NOT CONTROLLED WITH YOUR NAUSEA MEDICATION *UNUSUAL SHORTNESS OF BREATH *UNUSUAL BRUISING OR BLEEDING *URINARY PROBLEMS (pain or burning when urinating, or frequent urination) *BOWEL PROBLEMS (unusual diarrhea, constipation, pain near the anus) TENDERNESS IN MOUTH  AND THROAT WITH OR WITHOUT PRESENCE OF ULCERS (sore throat, sores in mouth, or a toothache) UNUSUAL RASH, SWELLING OR PAIN  UNUSUAL VAGINAL DISCHARGE OR ITCHING   Items with * indicate a potential emergency and should be followed up as soon as possible or go to the Emergency Department if any problems should occur.  Please show the CHEMOTHERAPY ALERT CARD or IMMUNOTHERAPY ALERT CARD at check-in to the Emergency  Department and triage nurse.  Should you have questions after your visit or need to cancel or reschedule your appointment, please contact [facility]  Dept: [phone]  and follow the prompts.  Office hours are 8:00 a.m. to 4:30 p.m. Monday - Friday. Please note that voicemails left after 4:00 p.m. may not be returned until the following business day.  We are closed weekends and major holidays. You have access to a nurse at all times for urgent questions. Please call the main number to the clinic Dept: [phone] and follow the prompts.  For any non-urgent questions, you may also contact your provider using MyChart. We now offer e-Visits for anyone 62 and older to request care online for non-urgent symptoms. For details visit [email or web address].   Also download the MyChart app! Go to the app store, search "MyChart", open the app, select [facility], and log in with your MyChart username and password.  Masks are optional in the cancer centers. If you would like for your care team to wear a mask while they are taking care of you, please let them know. You may have one support person who is at least 79 years old accompany you for your appointments.

## 2022-05-30 ENCOUNTER — Encounter: Payer: Self-pay | Admitting: Oncology

## 2022-05-30 ENCOUNTER — Encounter: Payer: Self-pay | Admitting: Nurse Practitioner

## 2022-05-30 ENCOUNTER — Inpatient Hospital Stay (INDEPENDENT_AMBULATORY_CARE_PROVIDER_SITE_OTHER): Payer: Medicare Other | Admitting: Oncology

## 2022-05-30 ENCOUNTER — Ambulatory Visit (INDEPENDENT_AMBULATORY_CARE_PROVIDER_SITE_OTHER): Payer: Medicare Other | Admitting: Nurse Practitioner

## 2022-05-30 ENCOUNTER — Inpatient Hospital Stay: Payer: Medicare Other

## 2022-05-30 VITALS — BP 122/78 | HR 83 | Temp 97.2°F | Ht 62.0 in | Wt 156.0 lb

## 2022-05-30 VITALS — BP 142/61 | HR 91 | Temp 98.1°F | Resp 20 | Ht 60.8 in | Wt 155.6 lb

## 2022-05-30 DIAGNOSIS — R300 Dysuria: Secondary | ICD-10-CM | POA: Insufficient documentation

## 2022-05-30 DIAGNOSIS — Z510 Encounter for antineoplastic radiation therapy: Secondary | ICD-10-CM | POA: Diagnosis not present

## 2022-05-30 DIAGNOSIS — I129 Hypertensive chronic kidney disease with stage 1 through stage 4 chronic kidney disease, or unspecified chronic kidney disease: Secondary | ICD-10-CM

## 2022-05-30 DIAGNOSIS — Z09 Encounter for follow-up examination after completed treatment for conditions other than malignant neoplasm: Secondary | ICD-10-CM | POA: Diagnosis not present

## 2022-05-30 DIAGNOSIS — Z85528 Personal history of other malignant neoplasm of kidney: Secondary | ICD-10-CM

## 2022-05-30 DIAGNOSIS — Z862 Personal history of diseases of the blood and blood-forming organs and certain disorders involving the immune mechanism: Secondary | ICD-10-CM

## 2022-05-30 DIAGNOSIS — C678 Malignant neoplasm of overlapping sites of bladder: Secondary | ICD-10-CM

## 2022-05-30 DIAGNOSIS — Z905 Acquired absence of kidney: Secondary | ICD-10-CM | POA: Diagnosis not present

## 2022-05-30 DIAGNOSIS — I5033 Acute on chronic diastolic (congestive) heart failure: Secondary | ICD-10-CM

## 2022-05-30 DIAGNOSIS — E875 Hyperkalemia: Secondary | ICD-10-CM

## 2022-05-30 DIAGNOSIS — N189 Chronic kidney disease, unspecified: Secondary | ICD-10-CM | POA: Diagnosis not present

## 2022-05-30 DIAGNOSIS — N184 Chronic kidney disease, stage 4 (severe): Secondary | ICD-10-CM | POA: Diagnosis not present

## 2022-05-30 DIAGNOSIS — E782 Mixed hyperlipidemia: Secondary | ICD-10-CM

## 2022-05-30 DIAGNOSIS — C679 Malignant neoplasm of bladder, unspecified: Secondary | ICD-10-CM | POA: Diagnosis not present

## 2022-05-30 DIAGNOSIS — G4700 Insomnia, unspecified: Secondary | ICD-10-CM | POA: Diagnosis not present

## 2022-05-30 DIAGNOSIS — C672 Malignant neoplasm of lateral wall of bladder: Secondary | ICD-10-CM | POA: Diagnosis not present

## 2022-05-30 DIAGNOSIS — Z7189 Other specified counseling: Secondary | ICD-10-CM | POA: Diagnosis not present

## 2022-05-30 DIAGNOSIS — E785 Hyperlipidemia, unspecified: Secondary | ICD-10-CM | POA: Diagnosis not present

## 2022-05-30 DIAGNOSIS — I110 Hypertensive heart disease with heart failure: Secondary | ICD-10-CM | POA: Diagnosis not present

## 2022-05-30 DIAGNOSIS — J449 Chronic obstructive pulmonary disease, unspecified: Secondary | ICD-10-CM | POA: Diagnosis not present

## 2022-05-30 DIAGNOSIS — K219 Gastro-esophageal reflux disease without esophagitis: Secondary | ICD-10-CM | POA: Diagnosis not present

## 2022-05-30 DIAGNOSIS — I10 Essential (primary) hypertension: Secondary | ICD-10-CM

## 2022-05-30 DIAGNOSIS — Z5111 Encounter for antineoplastic chemotherapy: Secondary | ICD-10-CM | POA: Diagnosis not present

## 2022-05-30 DIAGNOSIS — I130 Hypertensive heart and chronic kidney disease with heart failure and stage 1 through stage 4 chronic kidney disease, or unspecified chronic kidney disease: Secondary | ICD-10-CM | POA: Diagnosis not present

## 2022-05-30 DIAGNOSIS — I509 Heart failure, unspecified: Secondary | ICD-10-CM | POA: Diagnosis not present

## 2022-05-30 LAB — CMP (CANCER CENTER ONLY)
ALT: 11 U/L (ref 0–44)
AST: 18 U/L (ref 15–41)
Albumin: 3.2 g/dL — ABNORMAL LOW (ref 3.5–5.0)
Alkaline Phosphatase: 64 U/L (ref 38–126)
Anion gap: 7 (ref 5–15)
BUN: 57 mg/dL — ABNORMAL HIGH (ref 8–23)
CO2: 20 mmol/L — ABNORMAL LOW (ref 22–32)
Calcium: 8.6 mg/dL — ABNORMAL LOW (ref 8.9–10.3)
Chloride: 109 mmol/L (ref 98–111)
Creatinine: 2.58 mg/dL — ABNORMAL HIGH (ref 0.44–1.00)
GFR, Estimated: 18 mL/min — ABNORMAL LOW (ref >60)
Glucose, Bld: 95 mg/dL (ref 70–99)
Potassium: 5.4 mmol/L — ABNORMAL HIGH (ref 3.5–5.1)
Sodium: 136 mmol/L (ref 135–145)
Total Bilirubin: 0.5 mg/dL (ref 0.3–1.2)
Total Protein: 6.5 g/dL (ref 6.5–8.1)

## 2022-05-30 LAB — URINALYSIS, COMPLETE (UACMP) WITH MICROSCOPIC
Bilirubin Urine: NEGATIVE
Glucose, UA: NEGATIVE mg/dL
Ketones, ur: NEGATIVE mg/dL
Nitrite: NEGATIVE
Protein, ur: 100 mg/dL — AB
RBC / HPF: >50 RBC/hpf — ABNORMAL HIGH (ref 0–5)
Specific Gravity, Urine: 1.014 (ref 1.005–1.030)
WBC, UA: >50 WBC/hpf — ABNORMAL HIGH (ref 0–5)
pH: 5 (ref 5.0–8.0)

## 2022-05-30 LAB — CBC WITH DIFFERENTIAL (CANCER CENTER ONLY)
Abs Immature Granulocytes: 0.09 10*3/uL — ABNORMAL HIGH (ref 0.00–0.07)
Basophils Absolute: 0 10*3/uL (ref 0.0–0.1)
Basophils Relative: 0 %
Eosinophils Absolute: 0.2 10*3/uL (ref 0.0–0.5)
Eosinophils Relative: 2 %
HCT: 28.8 % — ABNORMAL LOW (ref 36.0–46.0)
Hemoglobin: 8.6 g/dL — ABNORMAL LOW (ref 12.0–15.0)
Immature Granulocytes: 1 %
Lymphocytes Relative: 19 %
Lymphs Abs: 1.9 10*3/uL (ref 0.7–4.0)
MCH: 30.5 pg (ref 26.0–34.0)
MCHC: 29.9 g/dL — ABNORMAL LOW (ref 30.0–36.0)
MCV: 102.1 fL — ABNORMAL HIGH (ref 80.0–100.0)
Monocytes Absolute: 0.7 10*3/uL (ref 0.1–1.0)
Monocytes Relative: 7 %
Neutro Abs: 7.1 10*3/uL (ref 1.7–7.7)
Neutrophils Relative %: 71 %
Platelet Count: 360 10*3/uL (ref 150–400)
RBC: 2.82 MIL/uL — ABNORMAL LOW (ref 3.87–5.11)
RDW: 15.5 % (ref 11.5–15.5)
WBC Count: 10 10*3/uL (ref 4.0–10.5)
nRBC: 0 % (ref 0.0–0.2)

## 2022-05-30 NOTE — Progress Notes (Signed)
[facility] Initial Visit:  Patient Care Team: Rochel Brome, MD as PCP - General (Internal Medicine) Lane Hacker, [facility] (Pharmacist) Barbee Cough, MD as Consulting Physician (Internal Medicine)  CHIEF COMPLAINTS/PURPOSE OF CONSULTATION:  Oncology History  Bladder cancer [facility])  08/05/2018 Initial Diagnosis   Bladder cancer ([place])   04/24/2022 Cancer Staging   Staging form: Urinary Bladder, AJCC 8th Edition - Clinical stage from 04/24/2022: Stage II (cT2, cN0, cM0) - Signed by Barbee Cough, MD on 04/24/2022 Histopathologic type: Transitional cell carcinoma, NOS Stage prefix: Initial diagnosis WHO/ISUP grade (low/high): High Grade Histologic grading system: 2 grade system   05/08/2022 -  Chemotherapy   Patient is on Treatment Plan : BLADDER Gemcitabine Twice Weekly + XRT       HISTORY OF PRESENTING ILLNESS: Alexandra [name] 81 y.o. female is here because of bladder cancer.   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  Superficial lesions along the right and left lateral walls of bladder.  Biopsy sites were fulgurated  September 30, 2018: Cystoscopy with bladder biopsy.  Small superficial lesions along the left lateral wall next to previous resection.  Biopsied and fulgurated  May 05, 2019 cystoscopy with bladder biopsy.  Small superficial recurrences along previous biopsy sites on lateral walls of bladder noted.  These were fulgurated  November 12, 2019 cystoscopy with bladder biopsy.  Small papillary lesion in area of right lateral wall near previous bladder resection scar..   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  Muscularis propria not present  April 27, 2021 cystoscopy with bladder biopsy.  Small papillary lesions in right posterior wall, left lateral wall and anterior wall.  Small papillary fronds at trigone.  Biopsies of the posterior wall of the bladder and bladder neck showed low-grade papillary urothelial carcinoma noninvasive   February 15, 2022: CT abdomen pelvis without contrast.  Previous left nephrectomy noted.  Mild right hydronephrosis to the level of bladder new from previous study.  Focal area of bladder wall thickening in the region of the right ureterovesicular junction raising suspicious for bladder cancer  March 15, 2022: Cystoscopy demonstrated right ureteral obstruction with hydronephrosis.  Underwent right retrograde pyelogram, dilation of ureteral stricture stent placement and TUR BT.   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  Trivial MR and TR.    March 28 2022 CMP notable for creatinine of 2.80 glucose 101 estimated GFR 16 calcium 7.2 albumin 3.1 Alk Phos 86  April 24 2022:  [facility] Medical Oncology Consult  Social:  Retired. Factory work.  Married.  Began smoking cigarettes 20 yrs of age, 1 ppd and quit 24 yrs ago.  EtOH none  [place] Mother died 52 old age Father killed when patient was 48 yrs of age Brother died 48 MI Sister alive 56 several medical problems Sister alive 4 thyroid problems.    April 25 2022:  Chemotherapy teaching  May 07 2022:  Port placed  May 08 2022:  Day 1 Gemcitabine 27 mg/m2 IV Twice Weekly with RT   May 09 2022:  Began XRT    May 10 2022:  Feels well.  No dysuria, pyuria, frequency or urgency.  No nausea or emesis CT chest without contrast not yet performed.  ECOG 1.  Has gained 3 lbs.    May 11 2022:  Cycle 1 Day 4 Gemcitabine 27 mg/m2 IV twice weekly with XRT  May 15 2022:  Patient self reported having COVID.  XRT and Chemotherapy appointments held  May 22 2022:  Cycle 1 Day 4 Gemcitabine 27 mg/m2 IV twice weekly with XRT  May 23 2022:   Recovered from [place].  She was fully vaccinated and had minimum of symptoms, mostly fatigue.  Has sensation of bladder fullness and frequency but no hematuria, dysuria.  To see Urologist today.  Had episode of hyperkalemia (asymptomatic).  She is on entresto and eats a lot of fruit.  Instructed her to decrease fruit intake.  CT chest without contrast, MRI abdomen, and U/S not yet performed  May 30 2022:  Scheduled follow up for management of bladder cancer. Still receiving XRT. Has cut down on how much fruit she eats.  Experiencing dysuria, frequency, urgency.  No hematuria.  Last week was placed on Abx and pyridium by urologist.  To undergo CT chest on Friday.      Review of Systems  Constitutional:  Positive for appetite change. Negative for chills, fatigue, fever and unexpected weight change.       Has bouts of nausea since cholecystectomy which interferes with appetite.  H  HENT:   Negative for hearing loss, mouth sores, nosebleeds, sore throat and trouble swallowing.   Eyes:  Negative for eye problems and icterus.       Vision changes:  None  Respiratory:  Negative for chest tightness, cough, hemoptysis, shortness of breath and wheezing.         Orthopnea:  Sleeps in lounge chair DOE:  walking up stairs  Cardiovascular:  Negative for chest pain, leg swelling and palpitations.       PND:  none Orthopnea:  none Leg swelling resolved with diuresis  Gastrointestinal:  Negative for abdominal pain, blood in stool, constipation, diarrhea,  nausea and vomiting.  Endocrine:       Cold intolerance:  none Heat intolerance:  none  Genitourinary:  Positive for nocturia. Negative for difficulty urinating, dysuria, frequency and hematuria.        Nocturia x 3 to 4  Musculoskeletal:  Negative for arthralgias, back pain, gait problem and myalgias.  Skin:  Negative for itching, rash and wound.  Neurological:  Negative for dizziness, extremity weakness, gait problem, headaches, light-headedness, numbness and speech difficulty.  Hematological:  Negative for adenopathy. Does not bruise/bleed easily.  Psychiatric/Behavioral:  Negative for suicidal ideas. The patient is not nervous/anxious.        Tosses and turns at night    MEDICAL HISTORY: Past Medical History:  Diagnosis Date   COPD (chronic obstructive pulmonary disease) (HCC)    GERD (gastroesophageal reflux disease)    Hyperlipidemia    Hyperparathyroidism, primary ([place]) 06/25/2016   Hypertension    Osteoporosis    Restless legs syndrome    RLS (restless legs syndrome)     SURGICAL HISTORY: Past Surgical History:  Procedure Laterality Date   ABDOMINAL HYSTERECTOMY     complete: menorrhagia. 81 yo   BACK SURGERY  2016   lower L 4 to  L 5   bladder tack and uterus lift  10/10/2015   CHOLECYSTECTOMY     summer of 2022   IR RADIOLOGIST EVAL & MGMT  12/03/2018   IR RADIOLOGIST EVAL & MGMT  12/31/2018   PARATHYROIDECTOMY Left 07/02/2016   Procedure: LEFT INFERIOR PARATHYROIDECTOMY;  Surgeon: Armandina Gemma, MD;  Location: [facility];  Service: General;  Laterality: Left;   SHOULDER OPEN ROTATOR CUFF REPAIR  12/2019   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    SOCIAL HISTORY: Social History   Socioeconomic History   Marital status: Married    Spouse name: Herbie [place]   Number of children: 2   Years of education: 11   Highest education level: 11th grade  Occupational History   Occupation: Retired  Tobacco Use   Smoking status: Former    Packs/day: 1.00    Years: 50.00     Total pack years: 50.00    Types: Cigarettes    Quit date: 06/11/2005    Years since quitting: 16.9   Smokeless tobacco: Never  Vaping Use   Vaping Use: Never used  Substance and Sexual Activity   Alcohol use: No   Drug use: No   Sexual activity: Yes    Partners: Male  Other Topics Concern   Not on file  Social History Narrative   Not on file   Social Determinants of Health   Financial Resource Strain: Medium Risk (05/01/2022)   Overall Financial Resource Strain (CARDIA)    Difficulty of Paying Living Expenses: Somewhat hard  Food Insecurity: No Food Insecurity (03/27/2022)   Hunger Vital Sign    Worried About Running Out of Food in the Last Year: Never true    [place] in the Last Year: Never true  Transportation Needs: No Transportation Needs (05/01/2022)   PRAPARE - Hydrologist (Medical): No    Lack of Transportation (Non-Medical): No  Physical Activity: Inactive (02/15/2022)   Exercise Vital Sign    Days of Exercise per Week: 0 days    Minutes of Exercise per Session: 0 min  Stress: No Stress Concern Present (02/15/2022)   [place]    Feeling of Stress : Only a little  Social Connections: Moderately Integrated (02/15/2022)   Social Connection and Isolation Panel [NHANES]    Frequency of Communication with Friends and Family: More than three times a week    Frequency of Social Gatherings with Friends and Family: More than three times a week    Attends Religious Services: More than 4 times per year    Active Member of [employer] or Organizations: No    Attends Archivist Meetings: Never    Marital Status: Married  Human resources officer Violence: Not At Risk (02/15/2022)   Humiliation, Afraid, Rape, and Kick questionnaire    Fear of Current or Ex-Partner: No    Emotionally Abused: No    Physically Abused: No    Sexually Abused: No    FAMILY HISTORY Family History   Problem Relation Age of Onset   Emphysema Mother    Hyperlipidemia Sister    Hypertension Sister    COPD Sister    Hyperlipidemia Sister    Hypertension Sister    Lymphoma Sister    Obesity Brother    Hyperlipidemia Brother    Hypertension Brother    Breast cancer Neg Hx     ALLERGIES:  is allergic to contrast media [iodinated contrast  media], omeprazole, shellfish allergy, shellfish-derived products, and zofran [ondansetron hcl].  MEDICATIONS:  Current Outpatient Medications  Medication Sig Dispense Refill   atorvastatin (LIPITOR) 40 MG tablet Take 1 tablet (40 mg total) by mouth daily. Per ccm 90 tablet 3   Budeson-Glycopyrrol-Formoterol (BREZTRI AEROSPHERE) 160-9-4.8 MCG/ACT AERO Inhale 2 puffs into the lungs in the morning and at bedtime. 10.7 g 6   metoprolol succinate (TOPROL-XL) 25 MG 24 hr tablet Take 25 mg by mouth daily.     prochlorperazine (COMPAZINE) 10 MG tablet Take 1 tablet (10 mg total) by mouth every 6 (six) hours as needed for nausea or vomiting. 30 tablet 1   rOPINIRole (REQUIP) 2 MG tablet TAKE 1 TABLET(2 MG) BY MOUTH IN THE MORNING AND AT BEDTIME 180 tablet 6   sacubitril-valsartan (ENTRESTO) 24-26 MG Take 1 tablet by mouth 2 (two) times daily. 60 tablet 6   sodium bicarbonate 650 MG tablet Take 650 mg by mouth 3 (three) times daily.     torsemide (DEMADEX) 20 MG tablet Take 1 tablet (20 mg total) by mouth daily. 30 tablet 3   VENTOLIN HFA 108 (90 Base) MCG/ACT inhaler      No current facility-administered medications for this visit.    PHYSICAL EXAMINATION:  ECOG PERFORMANCE STATUS: 1 - Symptomatic but completely ambulatory   Vitals:   05/30/22 0840  BP: (!) 142/61  Pulse: 91  Resp: 20  Temp: 98.1 F (36.7 C)  SpO2: 97%     Filed Weights   05/30/22 0840  Weight: 155 lb 9.6 oz (70.6 kg)      Physical Exam Vitals and nursing note reviewed.  Constitutional:      General: She is not in acute distress.    Appearance: Normal appearance.  She is normal weight. She is not ill-appearing, toxic-appearing or diaphoretic.     Comments: Here alone.  Elderly but looks younger than age  HENT:     Head: Normocephalic and atraumatic.     Right Ear: External ear normal.     Left Ear: External ear normal.     Nose: Nose normal. No congestion or rhinorrhea.  Eyes:     General: No scleral icterus.    Extraocular Movements: Extraocular movements intact.     Conjunctiva/sclera: Conjunctivae normal.     Pupils: Pupils are equal, round, and reactive to light.  Cardiovascular:     Rate and Rhythm: Normal rate and regular rhythm.     Heart sounds: No murmur heard.    No friction rub. No gallop.  Pulmonary:     Effort: Pulmonary effort is normal. No respiratory distress.     Breath sounds: Normal breath sounds. No stridor. No wheezing, rhonchi or rales.  Abdominal:     General: Bowel sounds are normal.     Palpations: Abdomen is soft.     Tenderness: There is no abdominal tenderness. There is no guarding or rebound.  Musculoskeletal:        General: No swelling, tenderness or deformity.     Cervical back: Normal range of motion and neck supple. No rigidity or tenderness.     Right lower leg: Edema present.     Left lower leg: Edema present.  Lymphadenopathy:     Head:     Right side of head: No submental, submandibular, tonsillar, preauricular, posterior auricular or occipital adenopathy.     Left side of head: No submental, submandibular, tonsillar, preauricular, posterior auricular or occipital adenopathy.     Cervical: No cervical adenopathy.  Right cervical: No superficial, deep or posterior cervical adenopathy.    Left cervical: No superficial, deep or posterior cervical adenopathy.     Upper Body:     Right upper body: No supraclavicular, axillary, pectoral or epitrochlear adenopathy.     Left upper body: No supraclavicular, axillary, pectoral or epitrochlear adenopathy.  Skin:    General: Skin is warm.     Coloration:  Skin is not jaundiced or pale.     Findings: No bruising or erythema.  Neurological:     General: No focal deficit present.     Mental Status: She is alert and oriented to person, place, and time.     Cranial Nerves: No cranial nerve deficit.     Gait: Gait normal.  Psychiatric:        Mood and Affect: Mood normal.        Behavior: Behavior normal.        Thought Content: Thought content normal.        Judgment: Judgment normal.     LABORATORY DATA: I have personally reviewed the data as listed:  Clinical Support on 05/23/2022  Component Date Value Ref Range Status   Sodium 05/23/2022 138  135 - 145 mmol/L Final   Potassium 05/23/2022 5.2 (H)  3.5 - 5.1 mmol/L Final   Chloride 05/23/2022 107  98 - 111 mmol/L Final   CO2 05/23/2022 22  22 - 32 mmol/L Final   Glucose, Bld 05/23/2022 107 (H)  70 - 99 mg/dL Final   Glucose reference range applies only to samples taken after fasting for at least 8 hours.   BUN 05/23/2022 51 (H)  8 - 23 mg/dL Final   Creatinine 05/23/2022 2.69 (H)  0.44 - 1.00 mg/dL Final   Calcium 05/23/2022 9.0  8.9 - 10.3 mg/dL Final   Total Protein 05/23/2022 6.5  6.5 - 8.1 g/dL Final   Albumin 05/23/2022 2.9 (L)  3.5 - 5.0 g/dL Final   AST 05/23/2022 18  15 - 41 U/L Final   ALT 05/23/2022 14  0 - 44 U/L Final   Alkaline Phosphatase 05/23/2022 64  38 - 126 U/L Final   Total Bilirubin 05/23/2022 0.7  0.3 - 1.2 mg/dL Final   GFR, Estimated 05/23/2022 17 (L)  >60 mL/min Final   Comment: (NOTE) Calculated using the CKD-EPI Creatinine Equation (2021)    Anion gap 05/23/2022 9  5 - 15 Final   Performed at [facility], [place] [address], [place], [place] [address]   WBC Count 05/23/2022 10.2  4.0 - 10.5 K/uL Final   RBC 05/23/2022 3.20 (L)  3.87 - 5.11 MIL/uL Final   Hemoglobin 05/23/2022 9.8 (L)  12.0 - 15.0 g/dL Final   HCT 05/23/2022 32.6 (L)  36.0 - 46.0 % Final   MCV 05/23/2022 101.9 (H)  80.0 - 100.0 fL Final   MCH 05/23/2022 30.6  26.0 -  34.0 pg Final   MCHC 05/23/2022 30.1  30.0 - 36.0 g/dL Final   RDW 05/23/2022 15.2  11.5 - 15.5 % Final   Platelet Count 05/23/2022 386  150 - 400 K/uL Final   nRBC 05/23/2022 0.0  0.0 - 0.2 % Final   Neutrophils Relative % 05/23/2022 70  % Final   Neutro Abs 05/23/2022 7.1  1.7 - 7.7 K/uL Final   Lymphocytes Relative 05/23/2022 20  % Final   Lymphs Abs 05/23/2022 2.1  0.7 - 4.0 K/uL Final   Monocytes Relative 05/23/2022 7  % Final  Monocytes Absolute 05/23/2022 0.7  0.1 - 1.0 K/uL Final   Eosinophils Relative 05/23/2022 2  % Final   Eosinophils Absolute 05/23/2022 0.2  0.0 - 0.5 K/uL Final   Basophils Relative 05/23/2022 0  % Final   Basophils Absolute 05/23/2022 0.0  0.0 - 0.1 K/uL Final   Immature Granulocytes 05/23/2022 1  % Final   Abs Immature Granulocytes 05/23/2022 0.07  0.00 - 0.07 K/uL Final   Performed at [facility], [place] [address] [place], [place] [address]  Appointment on 05/21/2022  Component Date Value Ref Range Status   WBC Count 05/21/2022 10.2  4.0 - 10.5 K/uL Final   RBC 05/21/2022 3.29 (L)  3.87 - 5.11 MIL/uL Final   Hemoglobin 05/21/2022 10.2 (L)  12.0 - 15.0 g/dL Final   HCT 05/21/2022 33.6 (L)  36.0 - 46.0 % Final   MCV 05/21/2022 102.1 (H)  80.0 - 100.0 fL Final   MCH 05/21/2022 31.0  26.0 - 34.0 pg Final   MCHC 05/21/2022 30.4  30.0 - 36.0 g/dL Final   RDW 05/21/2022 15.3  11.5 - 15.5 % Final   Platelet Count 05/21/2022 329  150 - 400 K/uL Final   nRBC 05/21/2022 0.0  0.0 - 0.2 % Final   Neutrophils Relative % 05/21/2022 65  % Final   Neutro Abs 05/21/2022 6.7  1.7 - 7.7 K/uL Final   Lymphocytes Relative 05/21/2022 23  % Final   Lymphs Abs 05/21/2022 2.3  0.7 - 4.0 K/uL Final   Monocytes Relative 05/21/2022 8  % Final   Monocytes Absolute 05/21/2022 0.8  0.1 - 1.0 K/uL Final   Eosinophils Relative 05/21/2022 2  % Final   Eosinophils Absolute 05/21/2022 0.2  0.0 - 0.5 K/uL Final   Basophils Relative 05/21/2022 0  % Final    Basophils Absolute 05/21/2022 0.0  0.0 - 0.1 K/uL Final   Immature Granulocytes 05/21/2022 2  % Final   Abs Immature Granulocytes 05/21/2022 0.17 (H)  0.00 - 0.07 K/uL Final   Performed at [facility], [place] [address] [place], [place] [address]   Sodium 05/21/2022 140  135 - 145 mmol/L Final   Potassium 05/21/2022 6.0 (H)  3.5 - 5.1 mmol/L Final   Chloride 05/21/2022 109  98 - 111 mmol/L Final   CO2 05/21/2022 24  22 - 32 mmol/L Final   Glucose, Bld 05/21/2022 113 (H)  70 - 99 mg/dL Final   Glucose reference range applies only to samples taken after fasting for at least 8 hours.   BUN 05/21/2022 42 (H)  8 - 23 mg/dL Final   Creatinine 05/21/2022 2.62 (H)  0.44 - 1.00 mg/dL Final   Calcium 05/21/2022 9.2  8.9 - 10.3 mg/dL Final   Total Protein 05/21/2022 6.6  6.5 - 8.1 g/dL Final   Albumin 05/21/2022 3.2 (L)  3.5 - 5.0 g/dL Final   AST 05/21/2022 21  15 - 41 U/L Final   ALT 05/21/2022 18  0 - 44 U/L Final   Alkaline Phosphatase 05/21/2022 73  38 - 126 U/L Final   Total Bilirubin 05/21/2022 0.6  0.3 - 1.2 mg/dL Final   GFR, Estimated 05/21/2022 18 (L)  >60 mL/min Final   Comment: (NOTE) Calculated using the CKD-EPI Creatinine Equation (2021)    Anion gap 05/21/2022 7  5 - 15 Final   Performed at [facility], [place] [address], [place], [place] [address]  Appointment on 05/02/2022  Component Date Value Ref Range  Status   WBC Count 05/02/2022 10.4  4.0 - 10.5 K/uL Final   RBC 05/02/2022 3.50 (L)  3.87 - 5.11 MIL/uL Final   Hemoglobin 05/02/2022 10.5 (L)  12.0 - 15.0 g/dL Final   HCT 05/02/2022 35.4 (L)  36.0 - 46.0 % Final   MCV 05/02/2022 101.1 (H)  80.0 - 100.0 fL Final   MCH 05/02/2022 30.0  26.0 - 34.0 pg Final   MCHC 05/02/2022 29.7 (L)  30.0 - 36.0 g/dL Final   RDW 05/02/2022 14.6  11.5 - 15.5 % Final   Platelet Count 05/02/2022 394  150 - 400 K/uL Final   nRBC 05/02/2022 0.0  0.0 - 0.2 % Final   Neutrophils Relative % 05/02/2022 63  % Final    Neutro Abs 05/02/2022 6.6  1.7 - 7.7 K/uL Final   Lymphocytes Relative 05/02/2022 24  % Final   Lymphs Abs 05/02/2022 2.5  0.7 - 4.0 K/uL Final   Monocytes Relative 05/02/2022 7  % Final   Monocytes Absolute 05/02/2022 0.8  0.1 - 1.0 K/uL Final   Eosinophils Relative 05/02/2022 3  % Final   Eosinophils Absolute 05/02/2022 0.3  0.0 - 0.5 K/uL Final   Basophils Relative 05/02/2022 1  % Final   Basophils Absolute 05/02/2022 0.1  0.0 - 0.1 K/uL Final   Immature Granulocytes 05/02/2022 2  % Final   Abs Immature Granulocytes 05/02/2022 0.17 (H)  0.00 - 0.07 K/uL Final   Performed at [facility], [place] [address], [place], [place] [address]   Sodium 05/02/2022 141  135 - 145 mmol/L Final   Potassium 05/02/2022 4.6  3.5 - 5.1 mmol/L Final   Chloride 05/02/2022 110  98 - 111 mmol/L Final   CO2 05/02/2022 23  22 - 32 mmol/L Final   Glucose, Bld 05/02/2022 111 (H)  70 - 99 mg/dL Final   Glucose reference range applies only to samples taken after fasting for at least 8 hours.   BUN 05/02/2022 65 (H)  8 - 23 mg/dL Final   Creatinine 05/02/2022 2.86 (H)  0.44 - 1.00 mg/dL Final   Calcium 05/02/2022 9.1  8.9 - 10.3 mg/dL Final   Total Protein 05/02/2022 7.0  6.5 - 8.1 g/dL Final   Albumin 05/02/2022 3.6  3.5 - 5.0 g/dL Final   AST 05/02/2022 23  15 - 41 U/L Final   ALT 05/02/2022 19  0 - 44 U/L Final   Alkaline Phosphatase 05/02/2022 84  38 - 126 U/L Final   Total Bilirubin 05/02/2022 0.5  0.3 - 1.2 mg/dL Final   GFR, Estimated 05/02/2022 16 (L)  >60 mL/min Final   Comment: (NOTE) Calculated using the CKD-EPI Creatinine Equation (2021)    Anion gap 05/02/2022 8  5 - 15 Final   Performed at [facility], [place] [address], [place], [place] [address]    RADIOGRAPHIC STUDIES: I have personally reviewed the radiological images as listed and agree with the findings in the report  No results found.  ASSESSMENT/PLAN  81 y.o. female with medical history notable  for COPD, GERD, hyperlipidemia, hyperparathyroidism, hypertension, osteoporosis, restless leg syndrome for chronic kidney disease, CT contrast allergy, gout, CKD, congestive heart failure.  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  Focal area of bladder wall thickening in the region of the right ureterovesicular junction raising suspicious for bladder cancer  October 2023:  Cystoscopy. Right ureteral obstruction with hydronephrosis.   Stent placed.  Biopsied.    June 01 2022:  For CT chest.  CT chest Will still need renal U/S and MRI as these were likely affected by recent covid dx  Major risk factor is long history of tobacco use  Renal cell carcinoma, Left kidney:  Treated with nephrectomy in 2003  Chronic Kidney Disease:  Multifactorial with largest component being nephrectomy.  Contributing factors are HTN, age, CHF.  This has been a contributing factor to   Therapeutics:  Dictated by age, comorbidities especially CKD which excludes consideration of cystectomy and platinum.  Has been evaluated by Radiation Oncology.    Has received chemotherapy teaching May 08 2022:  Day 1 Gemcitabine 27 mg/m2 IV Twice Weekly with RT  Having dysuria secondary to treatment but overall doing well.   Treatment was interrupted due to self limited COVID infection  Hyperkalemia:  Secondary to CKD, entresto and diet.  Discussed management.  Will check CMP today  Poor venous access:  Has undergone port placement.   Pelvic pressure:  Likely related to XRT +  chemotherapy.    History of kidney and bladder cancer:  Refered to genetics.     Cancer Staging  Bladder cancer [facility]) Staging form: Urinary Bladder, AJCC 8th Edition - Clinical stage from 04/24/2022: Stage II (cT2, cN0, cM0) - Signed by Barbee Cough, MD on 04/24/2022 Histopathologic type: Transitional cell carcinoma, NOS Stage prefix: Initial diagnosis WHO/ISUP grade (low/high): High Grade Histologic grading system: 2 grade system    No problem-specific Assessment & Plan notes found for this encounter.   No orders of the defined types were placed in this encounter.  21  minutes was spent in patient care.  This included time spent preparing to see the patient (e.g., review of tests), obtaining history, counseling and educating the patient  ordering tests, documenting clinical information in the electronic or other health record, independently interpreting results and communicating results to the patient as well as coordination of care.      All questions were answered. The patient knows to call the clinic with any problems, questions or concerns.    This note was electronically signed.    Barbee Cough, MD  05/30/2022 8:44 AM

## 2022-05-30 NOTE — Progress Notes (Signed)
Subjective:  Patient ID: Alexandra [name], female    DOB: 05/27/1941  Age: 81 y.o. MRN: [id number]  Chief Complaint  Patient presents with   Hyperlipidemia   Hypertension   HPI: Pt presents for follow-up of hypertensive CKD stage 4, she is followed by nephrology, [facility]. [name] is currently being treated for bladder carcinoma, followed by Dr Federico Flake, oncologist. Pt had labs drawn at onocology clinic prior to today's appt. She was recently diagnosed with heart failure with preserved EF. Currently treated with Entresto, Torsemide, and Metoprolol. Cardiology referral sent to [facility]. Pt was unable to keep appt due to hospitalization.   Hypertension, follow-up: She was last seen for hypertension 3 months ago.  BP at that visit was 128/76. Management includes metoprolol, Torsemide, Valsartan  She reports excellent compliance with treatment. She is not having side effects.  She is following a Regular diet. She is not exercising. She does not smoke.   Lipid/Cholesterol, Follow-up  Last lipid panel Other pertinent labs  Lab Results  Component Value Date   CHOL 110 02/27/2022   HDL 44 02/27/2022   LDLCALC 50 02/27/2022   TRIG 77 02/27/2022   CHOLHDL 2.5 02/27/2022   Lab Results  Component Value Date   ALT 14 05/23/2022   AST 18 05/23/2022   PLT 386 05/23/2022     Management includes Lipitor 40 mg QD. She reports excellent compliance with treatment. She is not having side effects.      Pertinent labs: Lab Results  Component Value Date   CHOL 110 02/27/2022   HDL 44 02/27/2022   LDLCALC 50 02/27/2022   TRIG 77 02/27/2022   CHOLHDL 2.5 02/27/2022   Lab Results  Component Value Date   NA 138 05/23/2022   K 5.2 (H) 05/23/2022   CREATININE 2.69 (H) 05/23/2022   EGFR 13 (L) 04/10/2022   GFRNONAA 17 (L) 05/23/2022   GLUCOSE 107 (H) 05/23/2022     GERD, Follow up:  Current treatment consist of: diet modification She reports excellent compliance  with treatment. She is not having side effects. . She IS experiencing heartburn.   COPD, follow-up: Pt has a history of COPD. Current treatment includes Breztri inhaler QD and Albuterol PRN. States symptoms are well-controlled. Reports mild intertmittent dyspnea and wheezing with activity. Denies side effects of treatment. Denies recent exacerbations or hospitalizations. Former cigarette smoker, quit 2007.   Current Outpatient Medications on File Prior to Visit  Medication Sig Dispense Refill   atorvastatin (LIPITOR) 40 MG tablet Take 1 tablet (40 mg total) by mouth daily. Per ccm 90 tablet 3   Budeson-Glycopyrrol-Formoterol (BREZTRI AEROSPHERE) 160-9-4.8 MCG/ACT AERO Inhale 2 puffs into the lungs in the morning and at bedtime. 10.7 g 6   metoprolol succinate (TOPROL-XL) 25 MG 24 hr tablet Take 25 mg by mouth daily.     prochlorperazine (COMPAZINE) 10 MG tablet Take 1 tablet (10 mg total) by mouth every 6 (six) hours as needed for nausea or vomiting. 30 tablet 1   rOPINIRole (REQUIP) 2 MG tablet TAKE 1 TABLET(2 MG) BY MOUTH IN THE MORNING AND AT BEDTIME 180 tablet 6   sacubitril-valsartan (ENTRESTO) 24-26 MG Take 1 tablet by mouth 2 (two) times daily. 60 tablet 6   sodium bicarbonate 650 MG tablet Take 650 mg by mouth 3 (three) times daily.     torsemide (DEMADEX) 20 MG tablet Take 1 tablet (20 mg total) by mouth daily. 30 tablet 3   VENTOLIN HFA 108 (90 Base) MCG/ACT inhaler  No current facility-administered medications on file prior to visit.   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 81 yo   BACK SURGERY  2016   lower L 4 to  L 5   bladder tack and uterus lift  10/10/2015   CHOLECYSTECTOMY     summer of 2022   IR RADIOLOGIST EVAL & MGMT  12/03/2018   IR RADIOLOGIST EVAL & MGMT  12/31/2018   PARATHYROIDECTOMY Left 07/02/2016   Procedure: LEFT INFERIOR PARATHYROIDECTOMY;  Surgeon: Armandina Gemma, MD;  Location: [facility];  Service: General;  Laterality: Left;   SHOULDER OPEN ROTATOR CUFF REPAIR  12/2019   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    Family History  Problem Relation Age of Onset   Emphysema Mother    Hyperlipidemia Sister    Hypertension Sister    COPD Sister    Hyperlipidemia Sister    Hypertension Sister    Lymphoma Sister    Obesity Brother    Hyperlipidemia Brother    Hypertension Brother    Breast cancer Neg Hx    Social History   Socioeconomic History   Marital status: Married    Spouse name: Herbie [place]   Number of children: 2   Years of education: 11   Highest education level: 11th grade  Occupational History   Occupation: Retired  Tobacco Use   Smoking status: Former    Packs/day: 1.00    Years: 50.00    Total pack years: 50.00    Types: Cigarettes    Quit date: 06/11/2005    Years since quitting: 16.9   Smokeless tobacco: Never  Vaping Use   Vaping Use: Never used  Substance and Sexual Activity   Alcohol use: No   Drug use: No   Sexual activity: Yes    Partners: Male  Other Topics Concern   Not on file  Social History Narrative   Not on file   Social Determinants of Health   Financial Resource Strain: Medium Risk (05/01/2022)   Overall Financial Resource Strain (CARDIA)    Difficulty of Paying Living Expenses: Somewhat hard  Food Insecurity: No Food Insecurity (03/27/2022)   Hunger Vital Sign    Worried About Running Out of Food in the Last Year: Never true    [place] in the Last Year: Never true  Transportation Needs: No Transportation Needs (05/01/2022)   PRAPARE - Hydrologist (Medical): No    Lack of Transportation  (Non-Medical): No  Physical Activity: Inactive (02/15/2022)   Exercise Vital Sign    Days of Exercise per Week: 0 days    Minutes of Exercise per Session: 0 min  Stress: No Stress Concern Present (02/15/2022)   [place]    Feeling of Stress : Only a little  Social Connections: Moderately Integrated (02/15/2022)   Social Connection and Isolation Panel [NHANES]    Frequency of Communication with Friends and Family: More than three times a week    Frequency of Social Gatherings with Friends and Family: More than three times a week  Attends Religious Services: More than 4 times per year    Active Member of Clubs or Organizations: No    Attends Archivist Meetings: Never    Marital Status: Married    Review of Systems  Constitutional:  Negative for chills, fatigue and fever.  HENT:  Negative for congestion, ear pain, rhinorrhea and sore throat.   Respiratory:  Negative for cough and shortness of breath.   Cardiovascular:  Negative for chest pain.  Gastrointestinal:  Negative for abdominal pain, constipation, diarrhea, nausea and vomiting.  Genitourinary:  Negative for dysuria and urgency.  Musculoskeletal:  Negative for back pain and myalgias.  Neurological:  Negative for dizziness, weakness, light-headedness and headaches.  Psychiatric/Behavioral:  Negative for dysphoric mood. The patient is not nervous/anxious.      Objective:  BP 122/78   Pulse 83   Temp (!) 97.2 F (36.2 C)   Ht _0  (1.575 m)   Wt 156 lb (70.8 kg)   SpO2 92%   BMI 28.53 kg/m       05/30/2022   10:18 AM 05/30/2022    8:40 AM 05/29/2022   10:10 AM  BP/Weight  Systolic BP  017 494  Diastolic BP  61 54  Wt. (Lbs) 156 155.6   BMI 28.53 kg/m2 29.59 kg/m2     Physical Exam Vitals reviewed.  HENT:     Right Ear: There is impacted cerumen.     Left Ear: Tympanic membrane normal.  Cardiovascular:     Rate and Rhythm: Normal  rate and regular rhythm.     Pulses: Normal pulses.     Heart sounds: Normal heart sounds.  Pulmonary:     Effort: Pulmonary effort is normal.     Breath sounds: Normal breath sounds.  Skin:    General: Skin is warm.     Capillary Refill: Capillary refill takes less than 2 seconds.  Neurological:     Mental Status: She is alert and oriented to person, place, and time.  Psychiatric:        Behavior: Behavior normal.    Lab Results  Component Value Date   WBC 10.2 05/23/2022   HGB 9.8 (L) 05/23/2022   HCT 32.6 (L) 05/23/2022   PLT 386 05/23/2022   GLUCOSE 107 (H) 05/23/2022   CHOL 110 02/27/2022   TRIG 77 02/27/2022   HDL 44 02/27/2022   LDLCALC 50 02/27/2022   ALT 14 05/23/2022   AST 18 05/23/2022   NA 138 05/23/2022   K 5.2 (H) 05/23/2022   CL 107 05/23/2022   CREATININE 2.69 (H) 05/23/2022   BUN 51 (H) 05/23/2022   CO2 22 05/23/2022   HGBA1C 5.6 04/04/2021      Assessment & Plan:   1. 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 Essential hypertension-well controlled -continue Metoprolol 25 mg QD -continue Torsemide 20 mg QD  3. Gastroesophageal reflux disease, unspecified whether esophagitis present-well controlled -avoid foods that trigger GERD  4. Mixed hyperlipidemia-well controlled -continue Lipitor 40 mg QD  5. Insomnia, unspecified type-well controlled  6. Malignant neoplasm of urinary bladder, unspecified site [facility]) -follow up with oncology  7. COPD, moderate (HCC)-well controlled -continue Breztri BID  8. Acute on chronic heart failure with preserved ejection fraction (HCC)-stable -continue Entresto 24-26 mg QD -continue Metoprolol 25 mg QD -continue Torsemide 20 mg QD -follow up with cardiology as scheduled   9. Anemia due to CKD-stable  Continue medications Follow-up with specialists as scheduled Instill Debrox ear drops to right ear  nightly for 1 week Return in 1 week for ear wax removal to right ear  Follow-up: 1 week for right ear wax removal  An After Visit Summary was printed and given to the patient.  I, Rip Harbour, NP, have reviewed all documentation for this visit. The documentation on 06/06/22 for the exam, diagnosis, procedures, and orders are all accurate and complete.    Signed, Rip Harbour, NP [place] [phone]

## 2022-05-30 NOTE — Patient Instructions (Addendum)
Continue medications Follow-up with specialists as scheduled Instill Debrox ear drops to right ear nightly for 1 week Return in 1 week for ear wax removal to right ear   Eating Plan for Osteoporosis Osteoporosis causes your bones to become weak and brittle. This puts you at greater risk for bone breaks (fractures) from small bumps or falls. Making changes to your diet and increasing your physical activity can help strengthen your bones and improve your overall health. Calcium and vitamin D are nutrients that play an important role in bone health. Vitamin D helps your body use calcium and strengthen bones. It is important to get enough calcium and vitamin D as part of your eating plan for osteoporosis. What are tips for following this plan? Reading food labels Try to get at least 1,000 milligrams (mg) of calcium each day. Look for foods that have at least 50 mg of calcium per serving. Talk with your health care provider about taking a calcium supplement if you do not get enough calcium from food. Do not have more than 2,500 mg of calcium each day. This is the upper limit for food and nutritional supplements combined. Too much calcium may cause constipation and prevent you from absorbing other important nutrients. Choose foods that contain vitamin D. Take a daily vitamin supplement that contains 800-1,000 international units (IU) of vitamin D. The amount may be different depending on your age, body weight, and where you live. Talk with your dietitian or health care provider about how much vitamin D is right for you. Avoid foods that have more than 300 mg of sodium per serving. Too much sodium can cause your body to lose calcium. Talk with your dietitian or health care provider about how much sodium you are allowed each day. Shopping Do not buy foods with added salt, including: Salted snacks. Angie Fava. Canned soups. Canned meats. Processed meats, such as bacon or precooked or cured meat like  sausages or meat loaves. Smoked fish. Meal planning Eat balanced meals that contain protein foods, fruits and vegetables, and foods rich in calcium and vitamin D. Eat at least 5 servings of fruits and vegetables each day. Eat 5-6 oz (142-170 g) of lean meat, poultry, fish, eggs, or beans each day. Lifestyle Do not use any products that contain nicotine or tobacco, such as cigarettes, e-cigarettes, and chewing tobacco. If you need help quitting, ask your health care provider. If your health care provider recommends that you lose weight: Work with a dietitian to develop an eating plan that will help you reach your desired weight goal. Exercise for at least 30 minutes a day, 5 or more days a week, or as told by your health care provider. Work with a physical therapist to develop an exercise plan that includes flexibility, balance, and strength exercises. Do not focus only on aerobic exercise. Do not drink alcohol if: Your health care provider tells you not to drink. You are pregnant, may be pregnant, or are planning to become pregnant. If you drink alcohol: Limit how much you use to: 0-1 drink a day for women. 0-2 drinks a day for men. Be aware of how much alcohol is in your drink. In the [place] one drink equals one 12 oz bottle of beer (355 mL), one 5 oz glass of wine (148 mL), or one 1 oz glass of hard liquor (44 mL). What foods should I eat? Foods high in calcium  Yogurt. Yogurt with fruit. Milk. Evaporated skim milk. Dry milk powder. Calcium-fortified orange juice. Parmesan  cheese. Part-skim ricotta cheese. Natural hard cheese. Cream cheese. Cottage cheese. Canned sardines. Canned salmon. Calcium-treated tofu. Calcium-fortified cereal bar. Calcium-fortified cereal. Calcium-fortified graham crackers. Cooked collard greens. Turnip greens. Broccoli. Kale. Almonds. White beans. Corn tortilla. Foods high in vitamin D Cod liver oil. Fatty fish, such as tuna, mackerel, and salmon. Milk.  Fortified soy milk. Fortified fruit juice. Yogurt. Margarine. Egg yolks. Foods high in protein Beef. Lamb. Pork tenderloin. Chicken breast. Tuna (canned). Fish fillet. Tofu. Cooked soy beans. Soy patty. Beans (canned or cooked). Cottage cheese. Yogurt. Peanut butter. Pumpkin seeds. Nuts. Sunflower seeds. Hard cheese. Milk or other milk products, such as soy milk. The items listed above may not be a complete list of foods and beverages you can eat. Contact a dietitian for more options. Summary Calcium and vitamin D are nutrients that play an important role in bone health and are an important part of your eating plan for osteoporosis. Eat balanced meals that contain protein foods, fruits and vegetables, and foods rich in calcium and vitamin D. Avoid foods that have more than 300 mg of sodium per serving. Too much sodium can cause your body to lose calcium. Exercise is an important part of prevention and treatment of osteoporosis. Aim for at least 30 minutes a day, 5 days a week. This information is not intended to replace advice given to you by your health care provider. Make sure you discuss any questions you have with your health care provider. Document Revised: 11/12/2019 Document Reviewed: 11/12/2019 Elsevier Patient Education  [place].

## 2022-05-31 DIAGNOSIS — C672 Malignant neoplasm of lateral wall of bladder: Secondary | ICD-10-CM | POA: Diagnosis not present

## 2022-05-31 DIAGNOSIS — J449 Chronic obstructive pulmonary disease, unspecified: Secondary | ICD-10-CM | POA: Diagnosis not present

## 2022-05-31 DIAGNOSIS — I110 Hypertensive heart disease with heart failure: Secondary | ICD-10-CM | POA: Diagnosis not present

## 2022-05-31 DIAGNOSIS — E785 Hyperlipidemia, unspecified: Secondary | ICD-10-CM | POA: Diagnosis not present

## 2022-05-31 DIAGNOSIS — Z510 Encounter for antineoplastic radiation therapy: Secondary | ICD-10-CM | POA: Diagnosis not present

## 2022-05-31 DIAGNOSIS — K219 Gastro-esophageal reflux disease without esophagitis: Secondary | ICD-10-CM | POA: Diagnosis not present

## 2022-05-31 NOTE — Progress Notes (Addendum)
Ok to treat on 06/01/22 with Scr 2.58 from 05/30/22 per MD. MD would like to keep Gemzar dose at '38mg'$ .  Acquanetta Belling, RPH, BCPS, BCOP 05/31/2022 11:09 AM

## 2022-06-01 ENCOUNTER — Inpatient Hospital Stay: Payer: Medicare Other

## 2022-06-01 VITALS — BP 147/67 | HR 84 | Temp 98.5°F | Resp 18 | Ht 62.0 in | Wt 154.1 lb

## 2022-06-01 DIAGNOSIS — I130 Hypertensive heart and chronic kidney disease with heart failure and stage 1 through stage 4 chronic kidney disease, or unspecified chronic kidney disease: Secondary | ICD-10-CM | POA: Diagnosis not present

## 2022-06-01 DIAGNOSIS — J9809 Other diseases of bronchus, not elsewhere classified: Secondary | ICD-10-CM | POA: Diagnosis not present

## 2022-06-01 DIAGNOSIS — C678 Malignant neoplasm of overlapping sites of bladder: Secondary | ICD-10-CM

## 2022-06-01 DIAGNOSIS — E785 Hyperlipidemia, unspecified: Secondary | ICD-10-CM | POA: Diagnosis not present

## 2022-06-01 DIAGNOSIS — Z5111 Encounter for antineoplastic chemotherapy: Secondary | ICD-10-CM | POA: Diagnosis not present

## 2022-06-01 DIAGNOSIS — C679 Malignant neoplasm of bladder, unspecified: Secondary | ICD-10-CM | POA: Diagnosis not present

## 2022-06-01 DIAGNOSIS — I110 Hypertensive heart disease with heart failure: Secondary | ICD-10-CM | POA: Diagnosis not present

## 2022-06-01 DIAGNOSIS — J449 Chronic obstructive pulmonary disease, unspecified: Secondary | ICD-10-CM | POA: Diagnosis not present

## 2022-06-01 DIAGNOSIS — K219 Gastro-esophageal reflux disease without esophagitis: Secondary | ICD-10-CM | POA: Diagnosis not present

## 2022-06-01 DIAGNOSIS — C672 Malignant neoplasm of lateral wall of bladder: Secondary | ICD-10-CM | POA: Diagnosis not present

## 2022-06-01 DIAGNOSIS — R300 Dysuria: Secondary | ICD-10-CM | POA: Diagnosis not present

## 2022-06-01 DIAGNOSIS — J439 Emphysema, unspecified: Secondary | ICD-10-CM | POA: Diagnosis not present

## 2022-06-01 DIAGNOSIS — Z510 Encounter for antineoplastic radiation therapy: Secondary | ICD-10-CM | POA: Diagnosis not present

## 2022-06-01 DIAGNOSIS — I509 Heart failure, unspecified: Secondary | ICD-10-CM | POA: Diagnosis not present

## 2022-06-01 MED ORDER — SODIUM CHLORIDE 0.9 % IV SOLN
38.0000 mg | Freq: Once | INTRAVENOUS | Status: AC
  Administered 2022-06-01: 38 mg via INTRAVENOUS
  Filled 2022-06-01: qty 1

## 2022-06-01 MED ORDER — SODIUM CHLORIDE 0.9 % IV SOLN: Freq: Once | INTRAVENOUS | Status: DC

## 2022-06-01 MED ORDER — HEPARIN SOD (PORK) LOCK FLUSH 100 UNIT/ML IV SOLN
500.0000 [IU] | Freq: Once | INTRAVENOUS | Status: AC | PRN
  Administered 2022-06-01: 500 [IU]

## 2022-06-01 MED ORDER — PROCHLORPERAZINE MALEATE 10 MG PO TABS: 10.0000 mg | ORAL_TABLET | Freq: Once | ORAL | Status: DC

## 2022-06-01 MED ORDER — SODIUM CHLORIDE 0.9% FLUSH
10.0000 mL | INTRAVENOUS | Status: DC | PRN
  Administered 2022-06-01: 10 mL

## 2022-06-01 MED ORDER — SODIUM CHLORIDE 0.9 % IV SOLN: Freq: Once | INTRAVENOUS | Status: AC

## 2022-06-01 MED FILL — Gemcitabine HCl Inj 1 GM/26.3ML (38 MG/ML) (Base Equiv): INTRAVENOUS | Qty: 1 | Status: AC

## 2022-06-01 NOTE — Patient Instructions (Addendum)
Alexandra [name]  Discharge Instructions: Thank you for choosing [facility] to provide your oncology and hematology care.  If you have a lab appointment with the [facility], please go directly to the [facility] and check in at the registration area.   Wear comfortable clothing and clothing appropriate for easy access to any Portacath or PICC line.   We strive to give you quality time with your provider. You may need to reschedule your appointment if you arrive late (15 or more minutes).  Arriving late affects you and other patients whose appointments are after yours.  Also, if you miss three or more appointments without notifying the office, you may be dismissed from the clinic at the provider's discretion.      For prescription refill requests, have your pharmacy contact our office and allow 72 hours for refills to be completed.    Today you received the following chemotherapy and/or immunotherapy agents gemcitabine      To help prevent nausea and vomiting after your treatment, we encourage you to take your nausea medication as directed.  BELOW ARE SYMPTOMS THAT SHOULD BE REPORTED IMMEDIATELY: *FEVER GREATER THAN 100.4 F (38 C) OR HIGHER *CHILLS OR SWEATING *NAUSEA AND VOMITING THAT IS NOT CONTROLLED WITH YOUR NAUSEA MEDICATION *UNUSUAL SHORTNESS OF BREATH *UNUSUAL BRUISING OR BLEEDING *URINARY PROBLEMS (pain or burning when urinating, or frequent urination) *BOWEL PROBLEMS (unusual diarrhea, constipation, pain near the anus) TENDERNESS IN MOUTH AND THROAT WITH OR WITHOUT PRESENCE OF ULCERS (sore throat, sores in mouth, or a toothache) UNUSUAL RASH, SWELLING OR PAIN  UNUSUAL VAGINAL DISCHARGE OR ITCHING   Items with * indicate a potential emergency and should be followed up as soon as possible or go to the Emergency Department if any problems should occur.  Please show the CHEMOTHERAPY ALERT CARD or IMMUNOTHERAPY ALERT CARD at check-in to the  Emergency Department and triage nurse.  Should you have questions after your visit or need to cancel or reschedule your appointment, please contact [facility]  Dept: [phone]  and follow the prompts.  Office hours are 8:00 a.m. to 4:30 p.m. Monday - Friday. Please note that voicemails left after 4:00 p.m. may not be returned until the following business day.  We are closed weekends and major holidays. You have access to a nurse at all times for urgent questions. Please call the main number to the clinic Dept: [phone] and follow the prompts.  For any non-urgent questions, you may also contact your provider using MyChart. We now offer e-Visits for anyone 60 and older to request care online for non-urgent symptoms. For details visit [email or web address].   Also download the MyChart app! Go to the app store, search "MyChart", open the app, select [facility], and log in with your MyChart username and password.  Masks are optional in the cancer centers. If you would like for your care team to wear a mask while they are taking care of you, please let them know. You may have one support person who is at least 79 years old accompany you for your appointments. Gemcitabine Injection What is this medication? GEMCITABINE (jem SYE ta been) treats some types of cancer. It works by slowing down the growth of cancer cells. This medicine may be used for other purposes; ask your health care provider or pharmacist if you have questions. COMMON BRAND NAME(S): Gemzar, Infugem What should I tell my care team before I take this medication? They need  to know if you have any of these conditions: Blood disorders Infection Kidney disease Liver disease Lung or breathing disease, such as asthma or COPD Recent or ongoing radiation therapy An unusual or allergic reaction to gemcitabine, other medications, foods, dyes, or preservatives If you or your partner are pregnant or trying to get  pregnant Breast-feeding How should I use this medication? This medication is injected into a vein. It is given by your care team in a hospital or clinic setting. Talk to your care team about the use of this medication in children. Special care may be needed. Overdosage: If you think you have taken too much of this medicine contact a poison control center or emergency room at once. NOTE: This medicine is only for you. Do not share this medicine with others. What if I miss a dose? Keep appointments for follow-up doses. It is important not to miss your dose. Call your care team if you are unable to keep an appointment. What may interact with this medication? Interactions have not been studied. This list may not describe all possible interactions. Give your health care provider a list of all the medicines, herbs, non-prescription drugs, or dietary supplements you use. Also tell them if you smoke, drink alcohol, or use illegal drugs. Some items may interact with your medicine. What should I watch for while using this medication? Your condition will be monitored carefully while you are receiving this medication. This medication may make you feel generally unwell. This is not uncommon, as chemotherapy can affect healthy cells as well as cancer cells. Report any side effects. Continue your course of treatment even though you feel ill unless your care team tells you to stop. In some cases, you may be given additional medications to help with side effects. Follow all directions for their use. This medication may increase your risk of getting an infection. Call your care team for advice if you get a fever, chills, sore throat, or other symptoms of a cold or flu. Do not treat yourself. Try to avoid being around people who are sick. This medication may increase your risk to bruise or bleed. Call your care team if you notice any unusual bleeding. Be careful brushing or flossing your teeth or using a toothpick  because you may get an infection or bleed more easily. If you have any dental work done, tell your dentist you are receiving this medication. Avoid taking medications that contain aspirin, acetaminophen, ibuprofen, naproxen, or ketoprofen unless instructed by your care team. These medications may hide a fever. Talk to your care team if you or your partner wish to become pregnant or think you might be pregnant. This medication can cause serious birth defects if taken during pregnancy and for 6 months after the last dose. A negative pregnancy test is required before starting this medication. A reliable form of contraception is recommended while taking this medication and for 6 months after the last dose. Talk to your care team about effective forms of contraception. Do not father a child while taking this medication and for 3 months after the last dose. Use a condom while having sex during this time period. Do not breastfeed while taking this medication and for at least 1 week after the last dose. This medication may cause infertility. Talk to your care team if you are concerned about your fertility. What side effects may I notice from receiving this medication? Side effects that you should report to your care team as soon as possible: Allergic  reactions--skin rash, itching, hives, swelling of the face, lips, tongue, or throat Capillary leak syndrome--stomach or muscle pain, unusual weakness or fatigue, feeling faint or lightheaded, decrease in the amount of urine, swelling of the ankles, hands, or feet, trouble breathing Infection--fever, chills, cough, sore throat, wounds that don't heal, pain or trouble when passing urine, general feeling of discomfort or being unwell Liver injury--right upper belly pain, loss of appetite, nausea, light-colored stool, dark yellow or brown urine, yellowing skin or eyes, unusual weakness or fatigue Low red blood cell level--unusual weakness or fatigue, dizziness, headache,  trouble breathing Lung injury--shortness of breath or trouble breathing, cough, spitting up blood, chest pain, fever Stomach pain, bloody diarrhea, pale skin, unusual weakness or fatigue, decrease in the amount of urine, which may be signs of hemolytic uremic syndrome Sudden and severe headache, confusion, change in vision, seizures, which may be signs of posterior reversible encephalopathy syndrome (PRES) Unusual bruising or bleeding Side effects that usually do not require medical attention (report to your care team if they continue or are bothersome): Diarrhea Drowsiness Hair loss Nausea Pain, redness, or swelling with sores inside the mouth or throat Vomiting This list may not describe all possible side effects. Call your doctor for medical advice about side effects. You may report side effects to FDA at 1-800-FDA-1088. Where should I keep my medication? This medication is given in a hospital or clinic. It will not be stored at home. NOTE: This sheet is a summary. It may not cover all possible information. If you have questions about this medicine, talk to your doctor, pharmacist, or health care provider.  2023 [employer]vier/Gold Standard (2007-07-19 00:00:00)

## 2022-06-05 ENCOUNTER — Inpatient Hospital Stay: Payer: Medicare Other

## 2022-06-05 VITALS — BP 128/58 | HR 88 | Temp 98.6°F | Resp 18 | Ht 62.0 in | Wt 154.1 lb

## 2022-06-05 DIAGNOSIS — C678 Malignant neoplasm of overlapping sites of bladder: Secondary | ICD-10-CM | POA: Diagnosis not present

## 2022-06-05 DIAGNOSIS — J449 Chronic obstructive pulmonary disease, unspecified: Secondary | ICD-10-CM | POA: Diagnosis not present

## 2022-06-05 DIAGNOSIS — Z5111 Encounter for antineoplastic chemotherapy: Secondary | ICD-10-CM | POA: Diagnosis not present

## 2022-06-05 DIAGNOSIS — R300 Dysuria: Secondary | ICD-10-CM | POA: Diagnosis not present

## 2022-06-05 DIAGNOSIS — C672 Malignant neoplasm of lateral wall of bladder: Secondary | ICD-10-CM | POA: Diagnosis not present

## 2022-06-05 DIAGNOSIS — I130 Hypertensive heart and chronic kidney disease with heart failure and stage 1 through stage 4 chronic kidney disease, or unspecified chronic kidney disease: Secondary | ICD-10-CM | POA: Diagnosis not present

## 2022-06-05 DIAGNOSIS — Z510 Encounter for antineoplastic radiation therapy: Secondary | ICD-10-CM | POA: Diagnosis not present

## 2022-06-05 DIAGNOSIS — I509 Heart failure, unspecified: Secondary | ICD-10-CM | POA: Diagnosis not present

## 2022-06-05 DIAGNOSIS — E785 Hyperlipidemia, unspecified: Secondary | ICD-10-CM | POA: Diagnosis not present

## 2022-06-05 DIAGNOSIS — K219 Gastro-esophageal reflux disease without esophagitis: Secondary | ICD-10-CM | POA: Diagnosis not present

## 2022-06-05 DIAGNOSIS — I110 Hypertensive heart disease with heart failure: Secondary | ICD-10-CM | POA: Diagnosis not present

## 2022-06-05 MED ORDER — SODIUM CHLORIDE 0.9 % IV SOLN
38.0000 mg | Freq: Once | INTRAVENOUS | Status: AC
  Administered 2022-06-05: 38 mg via INTRAVENOUS
  Filled 2022-06-05: qty 1

## 2022-06-05 MED ORDER — SODIUM CHLORIDE 0.9 % IV SOLN: Freq: Once | INTRAVENOUS | Status: DC

## 2022-06-05 MED ORDER — SODIUM CHLORIDE 0.9 % IV SOLN: Freq: Once | INTRAVENOUS | Status: AC

## 2022-06-05 MED ORDER — PROCHLORPERAZINE MALEATE 10 MG PO TABS: 10.0000 mg | ORAL_TABLET | Freq: Once | ORAL | Status: DC

## 2022-06-05 NOTE — Patient Instructions (Signed)
Gemcitabine Injection What is this medication? GEMCITABINE (jem SYE ta been) treats some types of cancer. It works by slowing down the growth of cancer cells. This medicine may be used for other purposes; ask your health care provider or pharmacist if you have questions. COMMON BRAND NAME(S): Gemzar, Infugem What should I tell my care team before I take this medication? They need to know if you have any of these conditions: Blood disorders Infection Kidney disease Liver disease Lung or breathing disease, such as asthma or COPD Recent or ongoing radiation therapy An unusual or allergic reaction to gemcitabine, other medications, foods, dyes, or preservatives If you or your partner are pregnant or trying to get pregnant Breast-feeding How should I use this medication? This medication is injected into a vein. It is given by your care team in a hospital or clinic setting. Talk to your care team about the use of this medication in children. Special care may be needed. Overdosage: If you think you have taken too much of this medicine contact a poison control center or emergency room at once. NOTE: This medicine is only for you. Do not share this medicine with others. What if I miss a dose? Keep appointments for follow-up doses. It is important not to miss your dose. Call your care team if you are unable to keep an appointment. What may interact with this medication? Interactions have not been studied. This list may not describe all possible interactions. Give your health care provider a list of all the medicines, herbs, non-prescription drugs, or dietary supplements you use. Also tell them if you smoke, drink alcohol, or use illegal drugs. Some items may interact with your medicine. What should I watch for while using this medication? Your condition will be monitored carefully while you are receiving this medication. This medication may make you feel generally unwell. This is not uncommon, as  chemotherapy can affect healthy cells as well as cancer cells. Report any side effects. Continue your course of treatment even though you feel ill unless your care team tells you to stop. In some cases, you may be given additional medications to help with side effects. Follow all directions for their use. This medication may increase your risk of getting an infection. Call your care team for advice if you get a fever, chills, sore throat, or other symptoms of a cold or flu. Do not treat yourself. Try to avoid being around people who are sick. This medication may increase your risk to bruise or bleed. Call your care team if you notice any unusual bleeding. Be careful brushing or flossing your teeth or using a toothpick because you may get an infection or bleed more easily. If you have any dental work done, tell your dentist you are receiving this medication. Avoid taking medications that contain aspirin, acetaminophen, ibuprofen, naproxen, or ketoprofen unless instructed by your care team. These medications may hide a fever. Talk to your care team if you or your partner wish to become pregnant or think you might be pregnant. This medication can cause serious birth defects if taken during pregnancy and for 6 months after the last dose. A negative pregnancy test is required before starting this medication. A reliable form of contraception is recommended while taking this medication and for 6 months after the last dose. Talk to your care team about effective forms of contraception. Do not father a child while taking this medication and for 3 months after the last dose. Use a condom while having sex   during this time period. Do not breastfeed while taking this medication and for at least 1 week after the last dose. This medication may cause infertility. Talk to your care team if you are concerned about your fertility. What side effects may I notice from receiving this medication? Side effects that you should  report to your care team as soon as possible: Allergic reactions--skin rash, itching, hives, swelling of the face, lips, tongue, or throat Capillary leak syndrome--stomach or muscle pain, unusual weakness or fatigue, feeling faint or lightheaded, decrease in the amount of urine, swelling of the ankles, hands, or feet, trouble breathing Infection--fever, chills, cough, sore throat, wounds that don't heal, pain or trouble when passing urine, general feeling of discomfort or being unwell Liver injury--right upper belly pain, loss of appetite, nausea, light-colored stool, dark yellow or brown urine, yellowing skin or eyes, unusual weakness or fatigue Low red blood cell level--unusual weakness or fatigue, dizziness, headache, trouble breathing Lung injury--shortness of breath or trouble breathing, cough, spitting up blood, chest pain, fever Stomach pain, bloody diarrhea, pale skin, unusual weakness or fatigue, decrease in the amount of urine, which may be signs of hemolytic uremic syndrome Sudden and severe headache, confusion, change in vision, seizures, which may be signs of posterior reversible encephalopathy syndrome (PRES) Unusual bruising or bleeding Side effects that usually do not require medical attention (report to your care team if they continue or are bothersome): Diarrhea Drowsiness Hair loss Nausea Pain, redness, or swelling with sores inside the mouth or throat Vomiting This list may not describe all possible side effects. Call your doctor for medical advice about side effects. You may report side effects to FDA at 1-800-FDA-1088. Where should I keep my medication? This medication is given in a hospital or clinic. It will not be stored at home. NOTE: This sheet is a summary. It may not cover all possible information. If you have questions about this medicine, talk to your doctor, pharmacist, or health care provider.  2023 [employer]vier/Gold Standard (2007-07-19 00:00:00)  

## 2022-06-06 ENCOUNTER — Inpatient Hospital Stay (INDEPENDENT_AMBULATORY_CARE_PROVIDER_SITE_OTHER): Payer: Medicare Other | Admitting: Oncology

## 2022-06-06 ENCOUNTER — Encounter: Payer: Self-pay | Admitting: Oncology

## 2022-06-06 ENCOUNTER — Inpatient Hospital Stay: Payer: Medicare Other

## 2022-06-06 VITALS — BP 174/70 | HR 100 | Temp 98.2°F | Resp 22 | Ht 62.0 in | Wt 158.7 lb

## 2022-06-06 DIAGNOSIS — Y842 Radiological procedure and radiotherapy as the cause of abnormal reaction of the patient, or of later complication, without mention of misadventure at the time of the procedure: Secondary | ICD-10-CM | POA: Diagnosis not present

## 2022-06-06 DIAGNOSIS — I5033 Acute on chronic diastolic (congestive) heart failure: Secondary | ICD-10-CM | POA: Insufficient documentation

## 2022-06-06 DIAGNOSIS — K219 Gastro-esophageal reflux disease without esophagitis: Secondary | ICD-10-CM | POA: Insufficient documentation

## 2022-06-06 DIAGNOSIS — T451X5A Adverse effect of antineoplastic and immunosuppressive drugs, initial encounter: Secondary | ICD-10-CM | POA: Diagnosis not present

## 2022-06-06 DIAGNOSIS — N1832 Chronic kidney disease, stage 3b: Secondary | ICD-10-CM | POA: Diagnosis not present

## 2022-06-06 DIAGNOSIS — D6481 Anemia due to antineoplastic chemotherapy: Secondary | ICD-10-CM | POA: Diagnosis not present

## 2022-06-06 DIAGNOSIS — I5032 Chronic diastolic (congestive) heart failure: Secondary | ICD-10-CM | POA: Insufficient documentation

## 2022-06-06 DIAGNOSIS — C678 Malignant neoplasm of overlapping sites of bladder: Secondary | ICD-10-CM

## 2022-06-06 DIAGNOSIS — I509 Heart failure, unspecified: Secondary | ICD-10-CM | POA: Insufficient documentation

## 2022-06-06 DIAGNOSIS — E785 Hyperlipidemia, unspecified: Secondary | ICD-10-CM | POA: Diagnosis not present

## 2022-06-06 DIAGNOSIS — Z09 Encounter for follow-up examination after completed treatment for conditions other than malignant neoplasm: Secondary | ICD-10-CM

## 2022-06-06 DIAGNOSIS — I130 Hypertensive heart and chronic kidney disease with heart failure and stage 1 through stage 4 chronic kidney disease, or unspecified chronic kidney disease: Secondary | ICD-10-CM | POA: Diagnosis not present

## 2022-06-06 DIAGNOSIS — J449 Chronic obstructive pulmonary disease, unspecified: Secondary | ICD-10-CM | POA: Diagnosis not present

## 2022-06-06 DIAGNOSIS — I110 Hypertensive heart disease with heart failure: Secondary | ICD-10-CM | POA: Diagnosis not present

## 2022-06-06 DIAGNOSIS — R5383 Other fatigue: Secondary | ICD-10-CM

## 2022-06-06 DIAGNOSIS — Y633 Inadvertent exposure of patient to radiation during medical care: Secondary | ICD-10-CM | POA: Diagnosis not present

## 2022-06-06 DIAGNOSIS — R300 Dysuria: Secondary | ICD-10-CM | POA: Diagnosis not present

## 2022-06-06 DIAGNOSIS — E875 Hyperkalemia: Secondary | ICD-10-CM

## 2022-06-06 DIAGNOSIS — Z5111 Encounter for antineoplastic chemotherapy: Secondary | ICD-10-CM | POA: Diagnosis not present

## 2022-06-06 DIAGNOSIS — Z510 Encounter for antineoplastic radiation therapy: Secondary | ICD-10-CM | POA: Diagnosis not present

## 2022-06-06 DIAGNOSIS — C672 Malignant neoplasm of lateral wall of bladder: Secondary | ICD-10-CM | POA: Diagnosis not present

## 2022-06-06 LAB — CMP (CANCER CENTER ONLY)
ALT: 29 U/L (ref 0–44)
AST: 32 U/L (ref 15–41)
Albumin: 3 g/dL — ABNORMAL LOW (ref 3.5–5.0)
Alkaline Phosphatase: 49 U/L (ref 38–126)
Anion gap: 8 (ref 5–15)
BUN: 55 mg/dL — ABNORMAL HIGH (ref 8–23)
CO2: 19 mmol/L — ABNORMAL LOW (ref 22–32)
Calcium: 8.3 mg/dL — ABNORMAL LOW (ref 8.9–10.3)
Chloride: 112 mmol/L — ABNORMAL HIGH (ref 98–111)
Creatinine: 2.97 mg/dL — ABNORMAL HIGH (ref 0.44–1.00)
GFR, Estimated: 16 mL/min — ABNORMAL LOW (ref >60)
Glucose, Bld: 104 mg/dL — ABNORMAL HIGH (ref 70–99)
Potassium: 4.9 mmol/L (ref 3.5–5.1)
Sodium: 139 mmol/L (ref 135–145)
Total Bilirubin: 0.4 mg/dL (ref 0.3–1.2)
Total Protein: 5.6 g/dL — ABNORMAL LOW (ref 6.5–8.1)

## 2022-06-06 LAB — CBC WITH DIFFERENTIAL (CANCER CENTER ONLY)
Abs Immature Granulocytes: 0.08 10*3/uL — ABNORMAL HIGH (ref 0.00–0.07)
Basophils Absolute: 0 10*3/uL (ref 0.0–0.1)
Basophils Relative: 0 %
Eosinophils Absolute: 0.3 10*3/uL (ref 0.0–0.5)
Eosinophils Relative: 3 %
HCT: 26.6 % — ABNORMAL LOW (ref 36.0–46.0)
Hemoglobin: 8.1 g/dL — ABNORMAL LOW (ref 12.0–15.0)
Immature Granulocytes: 1 %
Lymphocytes Relative: 13 %
Lymphs Abs: 1.3 10*3/uL (ref 0.7–4.0)
MCH: 31.3 pg (ref 26.0–34.0)
MCHC: 30.5 g/dL (ref 30.0–36.0)
MCV: 102.7 fL — ABNORMAL HIGH (ref 80.0–100.0)
Monocytes Absolute: 0.6 10*3/uL (ref 0.1–1.0)
Monocytes Relative: 6 %
Neutro Abs: 7.9 10*3/uL — ABNORMAL HIGH (ref 1.7–7.7)
Neutrophils Relative %: 77 %
Platelet Count: 225 10*3/uL (ref 150–400)
RBC: 2.59 MIL/uL — ABNORMAL LOW (ref 3.87–5.11)
RDW: 15.4 % (ref 11.5–15.5)
WBC Count: 10.1 10*3/uL (ref 4.0–10.5)
nRBC: 0.2 % (ref 0.0–0.2)

## 2022-06-06 NOTE — Progress Notes (Signed)
[place] [facility] Follow up Visit:  Patient Care Team: Rochel Brome, MD as PCP - General (Internal Medicine) Lane Hacker, [facility] (Pharmacist) Barbee Cough, MD as Consulting Physician (Internal Medicine)  CHIEF COMPLAINTS/PURPOSE OF CONSULTATION:  Oncology History  Bladder cancer [facility])  08/05/2018 Initial Diagnosis   Bladder cancer ([place])   04/24/2022 Cancer Staging   Staging form: Urinary Bladder, AJCC 8th Edition - Clinical stage from 04/24/2022: Stage II (cT2, cN0, cM0) - Signed by Barbee Cough, MD on 04/24/2022 Histopathologic type: Transitional cell carcinoma, NOS Stage prefix: Initial diagnosis WHO/ISUP grade (low/high): High Grade Histologic grading system: 2 grade system   05/08/2022 -  Chemotherapy   Patient is on Treatment Plan : BLADDER Gemcitabine Twice Weekly + XRT       HISTORY OF PRESENTING ILLNESS: Alexandra [name] 81 y.o. female is here because of bladder cancer.   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  Superficial lesions along the right and left lateral walls of bladder.  Biopsy sites were fulgurated  September 30, 2018: Cystoscopy with bladder biopsy.  Small superficial lesions along the left lateral wall next to previous resection.  Biopsied and fulgurated  May 05, 2019 cystoscopy with bladder biopsy.  Small superficial recurrences along previous biopsy sites on lateral walls of bladder noted.  These were fulgurated  November 12, 2019 cystoscopy with bladder biopsy.  Small papillary lesion in area of right lateral wall near previous bladder resection scar..   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  Muscularis propria not present  April 27, 2021 cystoscopy with bladder biopsy.  Small papillary lesions in right posterior wall, left lateral wall and anterior wall.  Small papillary fronds at trigone.  Biopsies of the posterior wall of the bladder and bladder neck showed low-grade papillary urothelial carcinoma noninvasive   February 15, 2022: CT abdomen pelvis without contrast.  Previous left nephrectomy noted.  Mild right hydronephrosis to the level of bladder new from previous study.  Focal area of bladder wall thickening in the region of the right ureterovesicular junction raising suspicious for bladder cancer  March 15, 2022: Cystoscopy demonstrated right ureteral obstruction with hydronephrosis.  Underwent right retrograde pyelogram, dilation of ureteral stricture stent placement and TUR BT.   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  Trivial MR and TR.    March 28 2022 CMP notable for creatinine of 2.80 glucose 101 estimated GFR 16 calcium 7.2 albumin 3.1 Alk Phos 86  April 24 2022:  [facility] Medical Oncology Consult  Social:  Retired. Factory work.  Married.  Began smoking cigarettes 20 yrs of age, 1 ppd and quit 24 yrs ago.  EtOH none  [place] Mother died 39 old age Father killed when patient was 58 yrs of age Brother died 49 MI Sister alive 54 several medical problems Sister alive 2 thyroid problems.    April 25 2022:  Chemotherapy teaching  May 07 2022:  Port placed  May 08 2022:  Day 1 Gemcitabine 27 mg/m2 IV Twice Weekly with RT   May 09 2022:  Began XRT    May 10 2022:  Feels well.  No dysuria, pyuria, frequency or urgency.  No nausea or emesis CT chest without contrast not yet performed.  ECOG 1.  Has gained 3 lbs.    May 11 2022:  Cycle 1 Day 4 Gemcitabine 27 mg/m2 IV twice weekly with XRT  May 15 2022:  Patient self reported having COVID.  XRT and Chemotherapy appointments held  May 22 2022:  Cycle 1 Day 4 Gemcitabine 27 mg/m2 IV twice weekly with XRT  May 23 2022:   Recovered from [place].  She was fully vaccinated and had minimum of symptoms, mostly fatigue.  Has sensation of bladder fullness and frequency but no hematuria, dysuria.  To see Urologist today.  Had episode of hyperkalemia (asymptomatic).  She is on entresto and eats a lot of fruit.  Instructed her to decrease fruit intake.  CT chest without contrast, MRI abdomen, and U/S not yet performed  May 30 2022:  . Still receiving XRT. Has cut down on how much fruit she eats.  Experiencing dysuria, frequency, urgency.  No hematuria.  Last week was placed on Abx and pyridium by urologist.  To undergo CT chest on Friday.    June 01 2022:  CT chest without contrast.  Minimal, bland bandlike scarring of the dependent bilateral lung bases. No acute airspace disease. Moderate emphysema and diffuse bilateral bronchial wall thickening.  Coronary artery disease.  Unchanged appearance of the upper abdomen status post left  nephrectomy, as well as a high attenuation right renal lesion  incompletely characterized by noncontrast CT of the chest.   June 06 2022:  Scheduled follow up for management of bladder cancer.  Fatigued.  Having bilateral LE edema.  Experiencing diarrhea to start on imodium.  Appetite fair.  Has gained 2 lbs but some of that may be due to edema.  Has mild nausea helped by antiemetics.    June 08 2022:  Gemcitabine  Review of Systems  Constitutional:  Positive for appetite change. Negative for chills, fatigue, fever and unexpected weight  change.       Has bouts of nausea since cholecystectomy which interferes with appetite.  H  HENT:   Negative for hearing loss, mouth sores, nosebleeds, sore throat and trouble swallowing.   Eyes:  Negative for eye problems and icterus.       Vision changes:  None  Respiratory:  Negative for chest tightness, cough, hemoptysis, shortness of breath and wheezing.         Orthopnea:  Sleeps in lounge chair DOE:  walking up stairs  Cardiovascular:  Negative for chest pain, leg swelling and palpitations.       PND:  none Orthopnea:  none Leg swelling resolved with diuresis  Gastrointestinal:  Negative for abdominal pain, blood in stool, constipation, diarrhea, nausea and vomiting.  Endocrine:       Cold intolerance:  none Heat intolerance:  none  Genitourinary:  Positive for nocturia. Negative for difficulty urinating, dysuria, frequency and hematuria.        Nocturia x 3 to 4  Musculoskeletal:  Negative for arthralgias, back pain, gait problem and myalgias.  Skin:  Negative for itching, rash and wound.  Neurological:  Negative for dizziness, extremity weakness, gait problem, headaches, light-headedness, numbness and speech difficulty.  Hematological:  Negative for adenopathy. Does not bruise/bleed  easily.  Psychiatric/Behavioral:  Negative for suicidal ideas. The patient is not nervous/anxious.        Tosses and turns at night    MEDICAL HISTORY: Past Medical History:  Diagnosis Date   COPD (chronic obstructive pulmonary disease) (HCC)    GERD (gastroesophageal reflux disease)    Hyperlipidemia    Hyperparathyroidism, primary ([place]) 06/25/2016   Hypertension    Osteoporosis    Restless legs syndrome    RLS (restless legs syndrome)     SURGICAL HISTORY: Past Surgical History:  Procedure Laterality Date   ABDOMINAL HYSTERECTOMY     complete: menorrhagia. 81 yo   BACK SURGERY  2016   lower L 4 to L 5   bladder tack and uterus lift  10/10/2015   CHOLECYSTECTOMY     summer of  2022   IR RADIOLOGIST EVAL & MGMT  12/03/2018   IR RADIOLOGIST EVAL & MGMT  12/31/2018   PARATHYROIDECTOMY Left 07/02/2016   Procedure: LEFT INFERIOR PARATHYROIDECTOMY;  Surgeon: Armandina Gemma, MD;  Location: [facility];  Service: General;  Laterality: Left;   SHOULDER OPEN ROTATOR CUFF REPAIR  12/2019   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    SOCIAL HISTORY: Social History   Socioeconomic History   Marital status: Married    Spouse name: Herbie Baltimore   Number of children: 2   Years of education: 11   Highest education level: 11th grade  Occupational History   Occupation: Retired  Tobacco Use   Smoking status: Former    Packs/day: 1.00    Years: 50.00    Total pack years: 50.00    Types: Cigarettes    Quit date: 06/11/2005    Years since quitting: 16.9   Smokeless tobacco: Never  Vaping Use   Vaping Use: Never used  Substance and Sexual Activity   Alcohol use: No   Drug use: No   Sexual activity: Yes    Partners: Male  Other Topics Concern   Not on file  Social History Narrative   Not on file   Social Determinants of Health   Financial Resource Strain: Medium Risk (05/01/2022)   Overall Financial Resource Strain (CARDIA)    Difficulty of Paying Living Expenses: Somewhat hard  Food Insecurity: No Food Insecurity (03/27/2022)   Hunger Vital Sign    Worried About Running Out of Food in the Last Year: Never true    [place] in the Last Year: Never true  Transportation Needs: No Transportation Needs (05/01/2022)   PRAPARE - Hydrologist (Medical): No    Lack of Transportation (Non-Medical): No  Physical Activity: Inactive (02/15/2022)   Exercise Vital Sign    Days of Exercise per Week: 0 days    Minutes of Exercise per Session: 0 min  Stress: No Stress Concern Present (02/15/2022)   National    Feeling of Stress : Only a little  Social Connections: Moderately Integrated  (02/15/2022)   Social Connection and Isolation Panel [NHANES]    Frequency of Communication with Friends and Family: More than three times a week    Frequency of Social Gatherings with Friends and Family: More than three times a week    Attends Religious Services: More than 4 times per year    Active Member of [employer] or Organizations: No    Attends Archivist Meetings: Never    Marital Status: Married  Human resources officer Violence: Not At  Risk (02/15/2022)   Humiliation, Afraid, Rape, and Kick questionnaire    Fear of Current or Ex-Partner: No    Emotionally Abused: No    Physically Abused: No    Sexually Abused: No    FAMILY HISTORY Family History  Problem Relation Age of Onset   Emphysema Mother    Hyperlipidemia Sister    Hypertension Sister    COPD Sister    Hyperlipidemia Sister    Hypertension Sister    Lymphoma Sister    Obesity Brother    Hyperlipidemia Brother    Hypertension Brother    Breast cancer Neg Hx     ALLERGIES:  is allergic to contrast media [iodinated contrast media], omeprazole, shellfish allergy, shellfish-derived products, and zofran [ondansetron hcl].  MEDICATIONS:  Current Outpatient Medications  Medication Sig Dispense Refill   atorvastatin (LIPITOR) 40 MG tablet Take 1 tablet (40 mg total) by mouth daily. Per ccm 90 tablet 3   Budeson-Glycopyrrol-Formoterol (BREZTRI AEROSPHERE) 160-9-4.8 MCG/ACT AERO Inhale 2 puffs into the lungs in the morning and at bedtime. 10.7 g 6   metoprolol succinate (TOPROL-XL) 25 MG 24 hr tablet Take 25 mg by mouth daily.     prochlorperazine (COMPAZINE) 10 MG tablet Take 1 tablet (10 mg total) by mouth every 6 (six) hours as needed for nausea or vomiting. 30 tablet 1   rOPINIRole (REQUIP) 2 MG tablet TAKE 1 TABLET(2 MG) BY MOUTH IN THE MORNING AND AT BEDTIME 180 tablet 6   sacubitril-valsartan (ENTRESTO) 24-26 MG Take 1 tablet by mouth 2 (two) times daily. 60 tablet 6   sodium bicarbonate 650 MG tablet Take 650 mg by  mouth 3 (three) times daily.     torsemide (DEMADEX) 20 MG tablet Take 1 tablet (20 mg total) by mouth daily. 30 tablet 3   VENTOLIN HFA 108 (90 Base) MCG/ACT inhaler      No current facility-administered medications for this visit.    PHYSICAL EXAMINATION:  ECOG PERFORMANCE STATUS: 1 - Symptomatic but completely ambulatory   Vitals:   06/06/22 0947  BP: (!) 174/70  Pulse: 100  Resp: (!) 22  Temp: 98.2 F (36.8 C)  SpO2: 97%     Filed Weights   06/06/22 0947  Weight: 158 lb 11.2 oz (72 kg)      Physical Exam Vitals and nursing note reviewed.  Constitutional:      General: She is not in acute distress.    Appearance: Normal appearance. She is normal weight. She is not ill-appearing, toxic-appearing or diaphoretic.     Comments: Here alone.  Elderly but looks younger than age  HENT:     Head: Normocephalic and atraumatic.     Right Ear: External ear normal.     Left Ear: External ear normal.     Nose: Nose normal. No congestion or rhinorrhea.  Eyes:     General: No scleral icterus.    Extraocular Movements: Extraocular movements intact.     Conjunctiva/sclera: Conjunctivae normal.     Pupils: Pupils are equal, round, and reactive to light.  Cardiovascular:     Rate and Rhythm: Normal rate and regular rhythm.     Heart sounds: No murmur heard.    No friction rub. No gallop.  Pulmonary:     Effort: Pulmonary effort is normal. No respiratory distress.     Breath sounds: Normal breath sounds. No stridor. No wheezing, rhonchi or rales.  Abdominal:     General: Bowel sounds are normal.     Palpations:  Abdomen is soft.     Tenderness: There is no abdominal tenderness. There is no guarding or rebound.  Musculoskeletal:        General: No swelling, tenderness or deformity.     Cervical back: Normal range of motion and neck supple. No rigidity or tenderness.     Right lower leg: Edema present.     Left lower leg: Edema present.  Lymphadenopathy:     Head:     Right  side of head: No submental, submandibular, tonsillar, preauricular, posterior auricular or occipital adenopathy.     Left side of head: No submental, submandibular, tonsillar, preauricular, posterior auricular or occipital adenopathy.     Cervical: No cervical adenopathy.     Right cervical: No superficial, deep or posterior cervical adenopathy.    Left cervical: No superficial, deep or posterior cervical adenopathy.     Upper Body:     Right upper body: No supraclavicular, axillary, pectoral or epitrochlear adenopathy.     Left upper body: No supraclavicular, axillary, pectoral or epitrochlear adenopathy.  Skin:    General: Skin is warm.     Coloration: Skin is not jaundiced or pale.     Findings: No bruising or erythema.  Neurological:     General: No focal deficit present.     Mental Status: She is alert and oriented to person, place, and time.     Cranial Nerves: No cranial nerve deficit.     Gait: Gait normal.  Psychiatric:        Mood and Affect: Mood normal.        Behavior: Behavior normal.        Thought Content: Thought content normal.        Judgment: Judgment normal.     LABORATORY DATA: I have personally reviewed the data as listed:  Appointment on 05/30/2022  Component Date Value Ref Range Status   WBC Count 05/30/2022 10.0  4.0 - 10.5 K/uL Final   RBC 05/30/2022 2.82 (L)  3.87 - 5.11 MIL/uL Final   Hemoglobin 05/30/2022 8.6 (L)  12.0 - 15.0 g/dL Final   HCT 05/30/2022 28.8 (L)  36.0 - 46.0 % Final   MCV 05/30/2022 102.1 (H)  80.0 - 100.0 fL Final   MCH 05/30/2022 30.5  26.0 - 34.0 pg Final   MCHC 05/30/2022 29.9 (L)  30.0 - 36.0 g/dL Final   RDW 05/30/2022 15.5  11.5 - 15.5 % Final   Platelet Count 05/30/2022 360  150 - 400 K/uL Final   nRBC 05/30/2022 0.0  0.0 - 0.2 % Final   Neutrophils Relative % 05/30/2022 71  % Final   Neutro Abs 05/30/2022 7.1  1.7 - 7.7 K/uL Final   Lymphocytes Relative 05/30/2022 19  % Final   Lymphs Abs 05/30/2022 1.9  0.7 - 4.0 K/uL  Final   Monocytes Relative 05/30/2022 7  % Final   Monocytes Absolute 05/30/2022 0.7  0.1 - 1.0 K/uL Final   Eosinophils Relative 05/30/2022 2  % Final   Eosinophils Absolute 05/30/2022 0.2  0.0 - 0.5 K/uL Final   Basophils Relative 05/30/2022 0  % Final   Basophils Absolute 05/30/2022 0.0  0.0 - 0.1 K/uL Final   Immature Granulocytes 05/30/2022 1  % Final   Abs Immature Granulocytes 05/30/2022 0.09 (H)  0.00 - 0.07 K/uL Final   Performed at [facility], [place] [address] [place], [place] [address]   Sodium 05/30/2022 136  135 - 145 mmol/L Final   Potassium  05/30/2022 5.4 (H)  3.5 - 5.1 mmol/L Final   Chloride 05/30/2022 109  98 - 111 mmol/L Final   CO2 05/30/2022 20 (L)  22 - 32 mmol/L Final   Glucose, Bld 05/30/2022 95  70 - 99 mg/dL Final   Glucose reference range applies only to samples taken after fasting for at least 8 hours.   BUN 05/30/2022 57 (H)  8 - 23 mg/dL Final   Creatinine 05/30/2022 2.58 (H)  0.44 - 1.00 mg/dL Final   Calcium 05/30/2022 8.6 (L)  8.9 - 10.3 mg/dL Final   Total Protein 05/30/2022 6.5  6.5 - 8.1 g/dL Final   Albumin 05/30/2022 3.2 (L)  3.5 - 5.0 g/dL Final   AST 05/30/2022 18  15 - 41 U/L Final   ALT 05/30/2022 11  0 - 44 U/L Final   Alkaline Phosphatase 05/30/2022 64  38 - 126 U/L Final   Total Bilirubin 05/30/2022 0.5  0.3 - 1.2 mg/dL Final   GFR, Estimated 05/30/2022 18 (L)  >60 mL/min Final   Comment: (NOTE) Calculated using the CKD-EPI Creatinine Equation (2021)    Anion gap 05/30/2022 7  5 - 15 Final   Performed at [facility], [place] [address], [place], [place] [address]   Color, Urine 05/30/2022 YELLOW  YELLOW Final   APPearance 05/30/2022 HAZY (A)  CLEAR Final   Specific Gravity, Urine 05/30/2022 1.014  1.005 - 1.030 Final   pH 05/30/2022 5.0  5.0 - 8.0 Final   Glucose, UA 05/30/2022 NEGATIVE  NEGATIVE mg/dL Final   Hgb urine dipstick 05/30/2022 MODERATE (A)  NEGATIVE Final   Bilirubin Urine 05/30/2022  NEGATIVE  NEGATIVE Final   Ketones, ur 05/30/2022 NEGATIVE  NEGATIVE mg/dL Final   Protein, ur 05/30/2022 100 (A)  NEGATIVE mg/dL Final   Nitrite 05/30/2022 NEGATIVE  NEGATIVE Final   Leukocytes,Ua 05/30/2022 LARGE (A)  NEGATIVE Final   RBC / HPF 05/30/2022 >50 (H)  0 - 5 RBC/hpf Final   WBC, UA 05/30/2022 >50 (H)  0 - 5 WBC/hpf Final   Bacteria, UA 05/30/2022 RARE (A)  NONE SEEN Final   Squamous Epithelial / LPF 05/30/2022 0-5  0 - 5 Final   Mucus 05/30/2022 PRESENT   Final   Performed at [facility], [place] [address] [place], [place] [address]  Clinical Support on 05/23/2022  Component Date Value Ref Range Status   Sodium 05/23/2022 138  135 - 145 mmol/L Final   Potassium 05/23/2022 5.2 (H)  3.5 - 5.1 mmol/L Final   Chloride 05/23/2022 107  98 - 111 mmol/L Final   CO2 05/23/2022 22  22 - 32 mmol/L Final   Glucose, Bld 05/23/2022 107 (H)  70 - 99 mg/dL Final   Glucose reference range applies only to samples taken after fasting for at least 8 hours.   BUN 05/23/2022 51 (H)  8 - 23 mg/dL Final   Creatinine 05/23/2022 2.69 (H)  0.44 - 1.00 mg/dL Final   Calcium 05/23/2022 9.0  8.9 - 10.3 mg/dL Final   Total Protein 05/23/2022 6.5  6.5 - 8.1 g/dL Final   Albumin 05/23/2022 2.9 (L)  3.5 - 5.0 g/dL Final   AST 05/23/2022 18  15 - 41 U/L Final   ALT 05/23/2022 14  0 - 44 U/L Final   Alkaline Phosphatase 05/23/2022 64  38 - 126 U/L Final   Total Bilirubin 05/23/2022 0.7  0.3 - 1.2 mg/dL Final   GFR, Estimated 05/23/2022 17 (L)  >60  mL/min Final   Comment: (NOTE) Calculated using the CKD-EPI Creatinine Equation (2021)    Anion gap 05/23/2022 9  5 - 15 Final   Performed at [facility], [place] [address], [place], [place] [address]   WBC Count 05/23/2022 10.2  4.0 - 10.5 K/uL Final   RBC 05/23/2022 3.20 (L)  3.87 - 5.11 MIL/uL Final   Hemoglobin 05/23/2022 9.8 (L)  12.0 - 15.0 g/dL Final   HCT 05/23/2022 32.6 (L)  36.0 - 46.0 % Final   MCV 05/23/2022  101.9 (H)  80.0 - 100.0 fL Final   MCH 05/23/2022 30.6  26.0 - 34.0 pg Final   MCHC 05/23/2022 30.1  30.0 - 36.0 g/dL Final   RDW 05/23/2022 15.2  11.5 - 15.5 % Final   Platelet Count 05/23/2022 386  150 - 400 K/uL Final   nRBC 05/23/2022 0.0  0.0 - 0.2 % Final   Neutrophils Relative % 05/23/2022 70  % Final   Neutro Abs 05/23/2022 7.1  1.7 - 7.7 K/uL Final   Lymphocytes Relative 05/23/2022 20  % Final   Lymphs Abs 05/23/2022 2.1  0.7 - 4.0 K/uL Final   Monocytes Relative 05/23/2022 7  % Final   Monocytes Absolute 05/23/2022 0.7  0.1 - 1.0 K/uL Final   Eosinophils Relative 05/23/2022 2  % Final   Eosinophils Absolute 05/23/2022 0.2  0.0 - 0.5 K/uL Final   Basophils Relative 05/23/2022 0  % Final   Basophils Absolute 05/23/2022 0.0  0.0 - 0.1 K/uL Final   Immature Granulocytes 05/23/2022 1  % Final   Abs Immature Granulocytes 05/23/2022 0.07  0.00 - 0.07 K/uL Final   Performed at [facility], [place] [address], [place], [place] [address]  Appointment on 05/21/2022  Component Date Value Ref Range Status   WBC Count 05/21/2022 10.2  4.0 - 10.5 K/uL Final   RBC 05/21/2022 3.29 (L)  3.87 - 5.11 MIL/uL Final   Hemoglobin 05/21/2022 10.2 (L)  12.0 - 15.0 g/dL Final   HCT 05/21/2022 33.6 (L)  36.0 - 46.0 % Final   MCV 05/21/2022 102.1 (H)  80.0 - 100.0 fL Final   MCH 05/21/2022 31.0  26.0 - 34.0 pg Final   MCHC 05/21/2022 30.4  30.0 - 36.0 g/dL Final   RDW 05/21/2022 15.3  11.5 - 15.5 % Final   Platelet Count 05/21/2022 329  150 - 400 K/uL Final   nRBC 05/21/2022 0.0  0.0 - 0.2 % Final   Neutrophils Relative % 05/21/2022 65  % Final   Neutro Abs 05/21/2022 6.7  1.7 - 7.7 K/uL Final   Lymphocytes Relative 05/21/2022 23  % Final   Lymphs Abs 05/21/2022 2.3  0.7 - 4.0 K/uL Final   Monocytes Relative 05/21/2022 8  % Final   Monocytes Absolute 05/21/2022 0.8  0.1 - 1.0 K/uL Final   Eosinophils Relative 05/21/2022 2  % Final   Eosinophils Absolute 05/21/2022 0.2  0.0 - 0.5  K/uL Final   Basophils Relative 05/21/2022 0  % Final   Basophils Absolute 05/21/2022 0.0  0.0 - 0.1 K/uL Final   Immature Granulocytes 05/21/2022 2  % Final   Abs Immature Granulocytes 05/21/2022 0.17 (H)  0.00 - 0.07 K/uL Final   Performed at [facility], [place] [address] [place], [place] [address]   Sodium 05/21/2022 140  135 - 145 mmol/L Final   Potassium 05/21/2022 6.0 (H)  3.5 - 5.1 mmol/L Final   Chloride 05/21/2022 109  98 - 111 mmol/L Final   CO2 05/21/2022 24  22 - 32 mmol/L Final   Glucose, Bld 05/21/2022 113 (H)  70 - 99 mg/dL Final   Glucose reference range applies only to samples taken after fasting for at least 8 hours.   BUN 05/21/2022 42 (H)  8 - 23 mg/dL Final   Creatinine 05/21/2022 2.62 (H)  0.44 - 1.00 mg/dL Final   Calcium 05/21/2022 9.2  8.9 - 10.3 mg/dL Final   Total Protein 05/21/2022 6.6  6.5 - 8.1 g/dL Final   Albumin 05/21/2022 3.2 (L)  3.5 - 5.0 g/dL Final   AST 05/21/2022 21  15 - 41 U/L Final   ALT 05/21/2022 18  0 - 44 U/L Final   Alkaline Phosphatase 05/21/2022 73  38 - 126 U/L Final   Total Bilirubin 05/21/2022 0.6  0.3 - 1.2 mg/dL Final   GFR, Estimated 05/21/2022 18 (L)  >60 mL/min Final   Comment: (NOTE) Calculated using the CKD-EPI Creatinine Equation (2021)    Anion gap 05/21/2022 7  5 - 15 Final   Performed at [facility], [place] [address] [place], [place] [address]    RADIOGRAPHIC STUDIES: I have personally reviewed the radiological images as listed and agree with the findings in the report  No results found.  ASSESSMENT/PLAN  81 y.o. female with medical history notable for COPD, GERD, hyperlipidemia, hyperparathyroidism, hypertension, osteoporosis, restless leg syndrome for chronic kidney disease, CT contrast allergy, gout, CKD, congestive heart failure.  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  Focal area of bladder wall thickening in the region of the right ureterovesicular junction raising suspicious for bladder cancer  October 2023:  Cystoscopy. Right ureteral obstruction with hydronephrosis.   Stent placed.  Biopsied.    June 01 2022:  CT chest.  No lung metastasis Will still need renal U/S and MRI as these were likely affected by recent covid dx  Major risk factor is long history of tobacco use  Renal cell carcinoma, Left kidney:  Treated with nephrectomy in 2003  Chronic Kidney Disease:  Multifactorial with largest component being nephrectomy.  Contributing factors are HTN, age, CHF.  This has been a contributing factor to   Therapeutics:  Dictated by age, comorbidities especially CKD which excludes consideration of cystectomy and platinum.  Has been evaluated by Radiation Oncology.    Has received chemotherapy teaching May 08 2022:  Day 1 Gemcitabine 27 mg/m2 IV Twice Weekly with RT  Experiencing dysuria and fatigue secondary to treatment will follow closely.   Treatment was interrupted due to self limited COVID infection June 08 2022:  For Gemcitabine  Fatigue:  Multifactorial.  Due to chemotherapy, XRT, CKD, frail elderly status.  Anemia  Anemia:  Multifactorial.  Secondary to chemo/xrt, ckd.  Transfuse for Hgb < 8.0   Hyperkalemia:  Secondary to CKD, entresto and diet.  Discussed management.    Poor venous access:  Has undergone port placement.   Pelvic pressure:  Likely related to XRT + chemotherapy.    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   No orders of the defined types were placed in this encounter.  45  minutes was spent in patient care.  This included time spent preparing to see the patient (e.g., review of tests), obtaining history, counseling and educating the patient  ordering tests, documenting clinical information in the electronic or other health record, independently interpreting results and communicating results to the patient as well as coordination of care.      All questions were answered. The patient knows to call the clinic with any problems, questions or concerns.    This note was electronically signed.    Barbee Cough, MD  06/06/2022 9:59 AM

## 2022-06-07 ENCOUNTER — Ambulatory Visit: Payer: Medicare Other

## 2022-06-07 ENCOUNTER — Encounter: Payer: Self-pay | Admitting: Oncology

## 2022-06-07 DIAGNOSIS — J449 Chronic obstructive pulmonary disease, unspecified: Secondary | ICD-10-CM | POA: Diagnosis not present

## 2022-06-07 DIAGNOSIS — I110 Hypertensive heart disease with heart failure: Secondary | ICD-10-CM | POA: Diagnosis not present

## 2022-06-07 DIAGNOSIS — C672 Malignant neoplasm of lateral wall of bladder: Secondary | ICD-10-CM | POA: Diagnosis not present

## 2022-06-07 DIAGNOSIS — E785 Hyperlipidemia, unspecified: Secondary | ICD-10-CM | POA: Diagnosis not present

## 2022-06-07 DIAGNOSIS — Z510 Encounter for antineoplastic radiation therapy: Secondary | ICD-10-CM | POA: Diagnosis not present

## 2022-06-07 DIAGNOSIS — K219 Gastro-esophageal reflux disease without esophagitis: Secondary | ICD-10-CM | POA: Diagnosis not present

## 2022-06-07 DIAGNOSIS — H6123 Impacted cerumen, bilateral: Secondary | ICD-10-CM

## 2022-06-07 MED FILL — Gemcitabine HCl Inj 1 GM/26.3ML (38 MG/ML) (Base Equiv): INTRAVENOUS | Qty: 1.2 | Status: AC

## 2022-06-08 ENCOUNTER — Inpatient Hospital Stay: Payer: Medicare Other

## 2022-06-08 VITALS — BP 124/58 | HR 74 | Temp 97.9°F | Resp 18 | Ht 62.0 in | Wt 161.1 lb

## 2022-06-08 DIAGNOSIS — I509 Heart failure, unspecified: Secondary | ICD-10-CM | POA: Diagnosis not present

## 2022-06-08 DIAGNOSIS — I110 Hypertensive heart disease with heart failure: Secondary | ICD-10-CM | POA: Diagnosis not present

## 2022-06-08 DIAGNOSIS — R300 Dysuria: Secondary | ICD-10-CM | POA: Diagnosis not present

## 2022-06-08 DIAGNOSIS — E785 Hyperlipidemia, unspecified: Secondary | ICD-10-CM | POA: Diagnosis not present

## 2022-06-08 DIAGNOSIS — Z5111 Encounter for antineoplastic chemotherapy: Secondary | ICD-10-CM | POA: Diagnosis not present

## 2022-06-08 DIAGNOSIS — I130 Hypertensive heart and chronic kidney disease with heart failure and stage 1 through stage 4 chronic kidney disease, or unspecified chronic kidney disease: Secondary | ICD-10-CM | POA: Diagnosis not present

## 2022-06-08 DIAGNOSIS — K219 Gastro-esophageal reflux disease without esophagitis: Secondary | ICD-10-CM | POA: Diagnosis not present

## 2022-06-08 DIAGNOSIS — C678 Malignant neoplasm of overlapping sites of bladder: Secondary | ICD-10-CM | POA: Diagnosis not present

## 2022-06-08 DIAGNOSIS — Z510 Encounter for antineoplastic radiation therapy: Secondary | ICD-10-CM | POA: Diagnosis not present

## 2022-06-08 DIAGNOSIS — J449 Chronic obstructive pulmonary disease, unspecified: Secondary | ICD-10-CM | POA: Diagnosis not present

## 2022-06-08 DIAGNOSIS — C672 Malignant neoplasm of lateral wall of bladder: Secondary | ICD-10-CM | POA: Diagnosis not present

## 2022-06-08 MED ORDER — PROCHLORPERAZINE MALEATE 10 MG PO TABS: 10.0000 mg | ORAL_TABLET | Freq: Once | ORAL | Status: DC

## 2022-06-08 MED ORDER — SODIUM CHLORIDE 0.9 % IV SOLN
27.0000 mg/m2 | Freq: Once | INTRAVENOUS | Status: AC
  Administered 2022-06-08: 46 mg via INTRAVENOUS
  Filled 2022-06-08: qty 1.21

## 2022-06-08 MED ORDER — SODIUM CHLORIDE 0.9 % IV SOLN: Freq: Once | INTRAVENOUS | Status: AC

## 2022-06-08 MED ORDER — SODIUM CHLORIDE 0.9% FLUSH
10.0000 mL | INTRAVENOUS | Status: DC | PRN
  Administered 2022-06-08: 10 mL

## 2022-06-08 MED ORDER — HEPARIN SOD (PORK) LOCK FLUSH 100 UNIT/ML IV SOLN
500.0000 [IU] | Freq: Once | INTRAVENOUS | Status: AC | PRN
  Administered 2022-06-08: 500 [IU]

## 2022-06-08 MED FILL — Gemcitabine HCl Inj 1 GM/26.3ML (38 MG/ML) (Base Equiv): INTRAVENOUS | Qty: 1.2 | Status: AC

## 2022-06-08 NOTE — Patient Instructions (Signed)
Gemcitabine Injection What is this medication? GEMCITABINE (jem SYE ta been) treats some types of cancer. It works by slowing down the growth of cancer cells. This medicine may be used for other purposes; ask your health care provider or pharmacist if you have questions. COMMON BRAND NAME(S): Gemzar, Infugem What should I tell my care team before I take this medication? They need to know if you have any of these conditions: Blood disorders Infection Kidney disease Liver disease Lung or breathing disease, such as asthma or COPD Recent or ongoing radiation therapy An unusual or allergic reaction to gemcitabine, other medications, foods, dyes, or preservatives If you or your partner are pregnant or trying to get pregnant Breast-feeding How should I use this medication? This medication is injected into a vein. It is given by your care team in a hospital or clinic setting. Talk to your care team about the use of this medication in children. Special care may be needed. Overdosage: If you think you have taken too much of this medicine contact a poison control center or emergency room at once. NOTE: This medicine is only for you. Do not share this medicine with others. What if I miss a dose? Keep appointments for follow-up doses. It is important not to miss your dose. Call your care team if you are unable to keep an appointment. What may interact with this medication? Interactions have not been studied. This list may not describe all possible interactions. Give your health care provider a list of all the medicines, herbs, non-prescription drugs, or dietary supplements you use. Also tell them if you smoke, drink alcohol, or use illegal drugs. Some items may interact with your medicine. What should I watch for while using this medication? Your condition will be monitored carefully while you are receiving this medication. This medication may make you feel generally unwell. This is not uncommon, as  chemotherapy can affect healthy cells as well as cancer cells. Report any side effects. Continue your course of treatment even though you feel ill unless your care team tells you to stop. In some cases, you may be given additional medications to help with side effects. Follow all directions for their use. This medication may increase your risk of getting an infection. Call your care team for advice if you get a fever, chills, sore throat, or other symptoms of a cold or flu. Do not treat yourself. Try to avoid being around people who are sick. This medication may increase your risk to bruise or bleed. Call your care team if you notice any unusual bleeding. Be careful brushing or flossing your teeth or using a toothpick because you may get an infection or bleed more easily. If you have any dental work done, tell your dentist you are receiving this medication. Avoid taking medications that contain aspirin, acetaminophen, ibuprofen, naproxen, or ketoprofen unless instructed by your care team. These medications may hide a fever. Talk to your care team if you or your partner wish to become pregnant or think you might be pregnant. This medication can cause serious birth defects if taken during pregnancy and for 6 months after the last dose. A negative pregnancy test is required before starting this medication. A reliable form of contraception is recommended while taking this medication and for 6 months after the last dose. Talk to your care team about effective forms of contraception. Do not father a child while taking this medication and for 3 months after the last dose. Use a condom while having sex  during this time period. Do not breastfeed while taking this medication and for at least 1 week after the last dose. This medication may cause infertility. Talk to your care team if you are concerned about your fertility. What side effects may I notice from receiving this medication? Side effects that you should  report to your care team as soon as possible: Allergic reactions--skin rash, itching, hives, swelling of the face, lips, tongue, or throat Capillary leak syndrome--stomach or muscle pain, unusual weakness or fatigue, feeling faint or lightheaded, decrease in the amount of urine, swelling of the ankles, hands, or feet, trouble breathing Infection--fever, chills, cough, sore throat, wounds that don't heal, pain or trouble when passing urine, general feeling of discomfort or being unwell Liver injury--right upper belly pain, loss of appetite, nausea, light-colored stool, dark yellow or brown urine, yellowing skin or eyes, unusual weakness or fatigue Low red blood cell level--unusual weakness or fatigue, dizziness, headache, trouble breathing Lung injury--shortness of breath or trouble breathing, cough, spitting up blood, chest pain, fever Stomach pain, bloody diarrhea, pale skin, unusual weakness or fatigue, decrease in the amount of urine, which may be signs of hemolytic uremic syndrome Sudden and severe headache, confusion, change in vision, seizures, which may be signs of posterior reversible encephalopathy syndrome (PRES) Unusual bruising or bleeding Side effects that usually do not require medical attention (report to your care team if they continue or are bothersome): Diarrhea Drowsiness Hair loss Nausea Pain, redness, or swelling with sores inside the mouth or throat Vomiting This list may not describe all possible side effects. Call your doctor for medical advice about side effects. You may report side effects to FDA at 1-800-FDA-1088. Where should I keep my medication? This medication is given in a hospital or clinic. It will not be stored at home. NOTE: This sheet is a summary. It may not cover all possible information. If you have questions about this medicine, talk to your doctor, pharmacist, or health care provider.  2023 Elsevier/Gold Standard (2007-07-19 00:00:00)  [place]  Discharge Instructions: Thank you for choosing [facility] to provide your oncology and hematology care.  If you have a lab appointment with the [facility], please go directly to the [facility] and check in at the registration area.   Wear comfortable clothing and clothing appropriate for easy access to any Portacath or PICC line.   We strive to give you quality time with your provider. You may need to reschedule your appointment if you arrive late (15 or more minutes).  Arriving late affects you and other patients whose appointments are after yours.  Also, if you miss three or more appointments without notifying the office, you may be dismissed from the clinic at the provider's discretion.      For prescription refill requests, have your pharmacy contact our office and allow 72 hours for refills to be completed.    Today you received the following chemotherapy and/or immunotherapy agents gemcitabine.      To help prevent nausea and vomiting after your treatment, we encourage you to take your nausea medication as directed.  BELOW ARE SYMPTOMS THAT SHOULD BE REPORTED IMMEDIATELY: *FEVER GREATER THAN 100.4 F (38 C) OR HIGHER *CHILLS OR SWEATING *NAUSEA AND VOMITING THAT IS NOT CONTROLLED WITH YOUR NAUSEA MEDICATION *UNUSUAL SHORTNESS OF BREATH *UNUSUAL BRUISING OR BLEEDING *URINARY PROBLEMS (pain or burning when urinating, or frequent urination) *BOWEL PROBLEMS (unusual diarrhea, constipation, pain near the anus) TENDERNESS IN MOUTH  AND THROAT WITH OR WITHOUT PRESENCE OF ULCERS (sore throat, sores in mouth, or a toothache) UNUSUAL RASH, SWELLING OR PAIN  UNUSUAL VAGINAL DISCHARGE OR ITCHING   Items with * indicate a potential emergency and should be followed up as soon as possible or go to the Emergency Department if any problems should occur.  Please show the CHEMOTHERAPY ALERT CARD or IMMUNOTHERAPY ALERT CARD at check-in to the Emergency  Department and triage nurse.  Should you have questions after your visit or need to cancel or reschedule your appointment, please contact [facility]  Dept: [phone]  and follow the prompts.  Office hours are 8:00 a.m. to 4:30 p.m. Monday - Friday. Please note that voicemails left after 4:00 p.m. may not be returned until the following business day.  We are closed weekends and major holidays. You have access to a nurse at all times for urgent questions. Please call the main number to the clinic Dept: [phone] and follow the prompts.  For any non-urgent questions, you may also contact your provider using MyChart. We now offer e-Visits for anyone 19 and older to request care online for non-urgent symptoms. For details visit [email or web address].   Also download the MyChart app! Go to the app store, search "MyChart", open the app, select [facility], and log in with your MyChart username and password.

## 2022-06-12 ENCOUNTER — Inpatient Hospital Stay: Payer: Medicare Other | Attending: Oncology

## 2022-06-12 VITALS — BP 120/58 | HR 78 | Temp 97.5°F | Resp 18 | Ht 62.0 in | Wt 163.0 lb

## 2022-06-12 DIAGNOSIS — I130 Hypertensive heart and chronic kidney disease with heart failure and stage 1 through stage 4 chronic kidney disease, or unspecified chronic kidney disease: Secondary | ICD-10-CM | POA: Insufficient documentation

## 2022-06-12 DIAGNOSIS — Z5111 Encounter for antineoplastic chemotherapy: Secondary | ICD-10-CM | POA: Insufficient documentation

## 2022-06-12 DIAGNOSIS — Z85528 Personal history of other malignant neoplasm of kidney: Secondary | ICD-10-CM | POA: Diagnosis not present

## 2022-06-12 DIAGNOSIS — M810 Age-related osteoporosis without current pathological fracture: Secondary | ICD-10-CM | POA: Insufficient documentation

## 2022-06-12 DIAGNOSIS — K219 Gastro-esophageal reflux disease without esophagitis: Secondary | ICD-10-CM | POA: Diagnosis not present

## 2022-06-12 DIAGNOSIS — E785 Hyperlipidemia, unspecified: Secondary | ICD-10-CM | POA: Diagnosis not present

## 2022-06-12 DIAGNOSIS — D631 Anemia in chronic kidney disease: Secondary | ICD-10-CM | POA: Insufficient documentation

## 2022-06-12 DIAGNOSIS — C678 Malignant neoplasm of overlapping sites of bladder: Secondary | ICD-10-CM | POA: Insufficient documentation

## 2022-06-12 DIAGNOSIS — Z79899 Other long term (current) drug therapy: Secondary | ICD-10-CM | POA: Diagnosis not present

## 2022-06-12 DIAGNOSIS — Z510 Encounter for antineoplastic radiation therapy: Secondary | ICD-10-CM | POA: Diagnosis not present

## 2022-06-12 DIAGNOSIS — N189 Chronic kidney disease, unspecified: Secondary | ICD-10-CM | POA: Diagnosis not present

## 2022-06-12 DIAGNOSIS — I110 Hypertensive heart disease with heart failure: Secondary | ICD-10-CM | POA: Diagnosis not present

## 2022-06-12 DIAGNOSIS — J449 Chronic obstructive pulmonary disease, unspecified: Secondary | ICD-10-CM | POA: Diagnosis not present

## 2022-06-12 DIAGNOSIS — C672 Malignant neoplasm of lateral wall of bladder: Secondary | ICD-10-CM | POA: Diagnosis not present

## 2022-06-12 DIAGNOSIS — I509 Heart failure, unspecified: Secondary | ICD-10-CM | POA: Diagnosis not present

## 2022-06-12 MED ORDER — HEPARIN SOD (PORK) LOCK FLUSH 100 UNIT/ML IV SOLN
500.0000 [IU] | Freq: Once | INTRAVENOUS | Status: AC | PRN
  Administered 2022-06-12: 500 [IU]

## 2022-06-12 MED ORDER — SODIUM CHLORIDE 0.9 % IV SOLN
27.0000 mg/m2 | Freq: Once | INTRAVENOUS | Status: AC
  Administered 2022-06-12: 46 mg via INTRAVENOUS
  Filled 2022-06-12: qty 1.21

## 2022-06-12 MED ORDER — PROCHLORPERAZINE MALEATE 10 MG PO TABS: 10.0000 mg | ORAL_TABLET | Freq: Once | ORAL | Status: DC

## 2022-06-12 MED ORDER — SODIUM CHLORIDE 0.9% FLUSH
10.0000 mL | INTRAVENOUS | Status: DC | PRN
  Administered 2022-06-12: 10 mL

## 2022-06-12 MED ORDER — SODIUM CHLORIDE 0.9 % IV SOLN: Freq: Once | INTRAVENOUS | Status: AC

## 2022-06-12 NOTE — Patient Instructions (Signed)
Gemcitabine Injection What is this medication? GEMCITABINE (jem SYE ta been) treats some types of cancer. It works by slowing down the growth of cancer cells. This medicine may be used for other purposes; ask your health care provider or pharmacist if you have questions. COMMON BRAND NAME(S): Gemzar, Infugem What should I tell my care team before I take this medication? They need to know if you have any of these conditions: Blood disorders Infection Kidney disease Liver disease Lung or breathing disease, such as asthma or COPD Recent or ongoing radiation therapy An unusual or allergic reaction to gemcitabine, other medications, foods, dyes, or preservatives If you or your partner are pregnant or trying to get pregnant Breast-feeding How should I use this medication? This medication is injected into a vein. It is given by your care team in a hospital or clinic setting. Talk to your care team about the use of this medication in children. Special care may be needed. Overdosage: If you think you have taken too much of this medicine contact a poison control center or emergency room at once. NOTE: This medicine is only for you. Do not share this medicine with others. What if I miss a dose? Keep appointments for follow-up doses. It is important not to miss your dose. Call your care team if you are unable to keep an appointment. What may interact with this medication? Interactions have not been studied. This list may not describe all possible interactions. Give your health care provider a list of all the medicines, herbs, non-prescription drugs, or dietary supplements you use. Also tell them if you smoke, drink alcohol, or use illegal drugs. Some items may interact with your medicine. What should I watch for while using this medication? Your condition will be monitored carefully while you are receiving this medication. This medication may make you feel generally unwell. This is not uncommon, as  chemotherapy can affect healthy cells as well as cancer cells. Report any side effects. Continue your course of treatment even though you feel ill unless your care team tells you to stop. In some cases, you may be given additional medications to help with side effects. Follow all directions for their use. This medication may increase your risk of getting an infection. Call your care team for advice if you get a fever, chills, sore throat, or other symptoms of a cold or flu. Do not treat yourself. Try to avoid being around people who are sick. This medication may increase your risk to bruise or bleed. Call your care team if you notice any unusual bleeding. Be careful brushing or flossing your teeth or using a toothpick because you may get an infection or bleed more easily. If you have any dental work done, tell your dentist you are receiving this medication. Avoid taking medications that contain aspirin, acetaminophen, ibuprofen, naproxen, or ketoprofen unless instructed by your care team. These medications may hide a fever. Talk to your care team if you or your partner wish to become pregnant or think you might be pregnant. This medication can cause serious birth defects if taken during pregnancy and for 6 months after the last dose. A negative pregnancy test is required before starting this medication. A reliable form of contraception is recommended while taking this medication and for 6 months after the last dose. Talk to your care team about effective forms of contraception. Do not father a child while taking this medication and for 3 months after the last dose. Use a condom while having sex  during this time period. Do not breastfeed while taking this medication and for at least 1 week after the last dose. This medication may cause infertility. Talk to your care team if you are concerned about your fertility. What side effects may I notice from receiving this medication? Side effects that you should  report to your care team as soon as possible: Allergic reactions--skin rash, itching, hives, swelling of the face, lips, tongue, or throat Capillary leak syndrome--stomach or muscle pain, unusual weakness or fatigue, feeling faint or lightheaded, decrease in the amount of urine, swelling of the ankles, hands, or feet, trouble breathing Infection--fever, chills, cough, sore throat, wounds that don't heal, pain or trouble when passing urine, general feeling of discomfort or being unwell Liver injury--right upper belly pain, loss of appetite, nausea, light-colored stool, dark yellow or brown urine, yellowing skin or eyes, unusual weakness or fatigue Low red blood cell level--unusual weakness or fatigue, dizziness, headache, trouble breathing Lung injury--shortness of breath or trouble breathing, cough, spitting up blood, chest pain, fever Stomach pain, bloody diarrhea, pale skin, unusual weakness or fatigue, decrease in the amount of urine, which may be signs of hemolytic uremic syndrome Sudden and severe headache, confusion, change in vision, seizures, which may be signs of posterior reversible encephalopathy syndrome (PRES) Unusual bruising or bleeding Side effects that usually do not require medical attention (report to your care team if they continue or are bothersome): Diarrhea Drowsiness Hair loss Nausea Pain, redness, or swelling with sores inside the mouth or throat Vomiting This list may not describe all possible side effects. Call your doctor for medical advice about side effects. You may report side effects to FDA at 1-800-FDA-1088. Where should I keep my medication? This medication is given in a hospital or clinic. It will not be stored at home. NOTE: This sheet is a summary. It may not cover all possible information. If you have questions about this medicine, talk to your doctor, pharmacist, or health care provider.  2023 Elsevier/Gold Standard (2007-07-19 00:00:00) [facility]  Discharge Instructions: Thank you for choosing [facility] to provide your oncology and hematology care.  If you have a lab appointment with the [facility], please go directly to the [facility] and check in at the registration area.   Wear comfortable clothing and clothing appropriate for easy access to any Portacath or PICC line.   We strive to give you quality time with your provider. You may need to reschedule your appointment if you arrive late (15 or more minutes).  Arriving late affects you and other patients whose appointments are after yours.  Also, if you miss three or more appointments without notifying the office, you may be dismissed from the clinic at the provider's discretion.      For prescription refill requests, have your pharmacy contact our office and allow 72 hours for refills to be completed.    Today you received the following chemotherapy and/or immunotherapy agents GEMCITABINE      To help prevent nausea and vomiting after your treatment, we encourage you to take your nausea medication as directed.  BELOW ARE SYMPTOMS THAT SHOULD BE REPORTED IMMEDIATELY: *FEVER GREATER THAN 100.4 F (38 C) OR HIGHER *CHILLS OR SWEATING *NAUSEA AND VOMITING THAT IS NOT CONTROLLED WITH YOUR NAUSEA MEDICATION *UNUSUAL SHORTNESS OF BREATH *UNUSUAL BRUISING OR BLEEDING *URINARY PROBLEMS (pain or burning when urinating, or frequent urination) *BOWEL PROBLEMS (unusual diarrhea, constipation, pain near the anus) TENDERNESS IN MOUTH AND  THROAT WITH OR WITHOUT PRESENCE OF ULCERS (sore throat, sores in mouth, or a toothache) UNUSUAL RASH, SWELLING OR PAIN  UNUSUAL VAGINAL DISCHARGE OR ITCHING   Items with * indicate a potential emergency and should be followed up as soon as possible or go to the Emergency Department if any problems should occur.  Please show the CHEMOTHERAPY ALERT CARD or IMMUNOTHERAPY ALERT CARD at check-in to the Emergency  Department and triage nurse.  Should you have questions after your visit or need to cancel or reschedule your appointment, please contact [facility]  Dept: [phone]  and follow the prompts.  Office hours are 8:00 a.m. to 4:30 p.m. Monday - Friday. Please note that voicemails left after 4:00 p.m. may not be returned until the following business day.  We are closed weekends and major holidays. You have access to a nurse at all times for urgent questions. Please call the main number to the clinic Dept: [phone] and follow the prompts.  For any non-urgent questions, you may also contact your provider using MyChart. We now offer e-Visits for anyone 19 and older to request care online for non-urgent symptoms. For details visit [email or web address].   Also download the MyChart app! Go to the app store, search "MyChart", open the app, select [facility], and log in with your MyChart username and password.

## 2022-06-13 ENCOUNTER — Other Ambulatory Visit: Payer: Self-pay

## 2022-06-13 ENCOUNTER — Inpatient Hospital Stay (INDEPENDENT_AMBULATORY_CARE_PROVIDER_SITE_OTHER): Payer: Medicare Other | Admitting: Oncology

## 2022-06-13 ENCOUNTER — Inpatient Hospital Stay: Payer: Medicare Other

## 2022-06-13 ENCOUNTER — Encounter: Payer: Self-pay | Admitting: Oncology

## 2022-06-13 VITALS — BP 119/59 | HR 94 | Temp 97.8°F | Resp 22 | Ht 62.0 in | Wt 161.1 lb

## 2022-06-13 DIAGNOSIS — I130 Hypertensive heart and chronic kidney disease with heart failure and stage 1 through stage 4 chronic kidney disease, or unspecified chronic kidney disease: Secondary | ICD-10-CM | POA: Diagnosis not present

## 2022-06-13 DIAGNOSIS — R5383 Other fatigue: Secondary | ICD-10-CM

## 2022-06-13 DIAGNOSIS — D6481 Anemia due to antineoplastic chemotherapy: Secondary | ICD-10-CM

## 2022-06-13 DIAGNOSIS — C678 Malignant neoplasm of overlapping sites of bladder: Secondary | ICD-10-CM

## 2022-06-13 DIAGNOSIS — C679 Malignant neoplasm of bladder, unspecified: Secondary | ICD-10-CM

## 2022-06-13 DIAGNOSIS — R54 Age-related physical debility: Secondary | ICD-10-CM | POA: Diagnosis not present

## 2022-06-13 DIAGNOSIS — T451X5A Adverse effect of antineoplastic and immunosuppressive drugs, initial encounter: Secondary | ICD-10-CM | POA: Diagnosis not present

## 2022-06-13 DIAGNOSIS — M810 Age-related osteoporosis without current pathological fracture: Secondary | ICD-10-CM | POA: Diagnosis not present

## 2022-06-13 DIAGNOSIS — Z09 Encounter for follow-up examination after completed treatment for conditions other than malignant neoplasm: Secondary | ICD-10-CM

## 2022-06-13 DIAGNOSIS — Y842 Radiological procedure and radiotherapy as the cause of abnormal reaction of the patient, or of later complication, without mention of misadventure at the time of the procedure: Secondary | ICD-10-CM | POA: Diagnosis not present

## 2022-06-13 DIAGNOSIS — Z905 Acquired absence of kidney: Secondary | ICD-10-CM

## 2022-06-13 DIAGNOSIS — N189 Chronic kidney disease, unspecified: Secondary | ICD-10-CM | POA: Diagnosis not present

## 2022-06-13 DIAGNOSIS — Z5111 Encounter for antineoplastic chemotherapy: Secondary | ICD-10-CM | POA: Diagnosis not present

## 2022-06-13 DIAGNOSIS — D631 Anemia in chronic kidney disease: Secondary | ICD-10-CM | POA: Diagnosis not present

## 2022-06-13 DIAGNOSIS — Y633 Inadvertent exposure of patient to radiation during medical care: Secondary | ICD-10-CM | POA: Diagnosis not present

## 2022-06-13 LAB — CBC WITH DIFFERENTIAL (CANCER CENTER ONLY)
Abs Immature Granulocytes: 0.12 10*3/uL — ABNORMAL HIGH (ref 0.00–0.07)
Basophils Absolute: 0 10*3/uL (ref 0.0–0.1)
Basophils Relative: 0 %
Eosinophils Absolute: 0.2 10*3/uL (ref 0.0–0.5)
Eosinophils Relative: 2 %
HCT: 23.8 % — ABNORMAL LOW (ref 36.0–46.0)
Hemoglobin: 7.4 g/dL — ABNORMAL LOW (ref 12.0–15.0)
Immature Granulocytes: 1 %
Lymphocytes Relative: 7 %
Lymphs Abs: 0.7 10*3/uL (ref 0.7–4.0)
MCH: 31.6 pg (ref 26.0–34.0)
MCHC: 31.1 g/dL (ref 30.0–36.0)
MCV: 101.7 fL — ABNORMAL HIGH (ref 80.0–100.0)
Monocytes Absolute: 0.4 10*3/uL (ref 0.1–1.0)
Monocytes Relative: 3 %
Neutro Abs: 9.8 10*3/uL — ABNORMAL HIGH (ref 1.7–7.7)
Neutrophils Relative %: 87 %
Platelet Count: 228 10*3/uL (ref 150–400)
RBC: 2.34 MIL/uL — ABNORMAL LOW (ref 3.87–5.11)
RDW: 15.4 % (ref 11.5–15.5)
WBC Count: 11.2 10*3/uL — ABNORMAL HIGH (ref 4.0–10.5)
nRBC: 0.3 % — ABNORMAL HIGH (ref 0.0–0.2)

## 2022-06-13 LAB — CMP (CANCER CENTER ONLY)
ALT: 28 U/L (ref 0–44)
AST: 30 U/L (ref 15–41)
Albumin: 3.1 g/dL — ABNORMAL LOW (ref 3.5–5.0)
Alkaline Phosphatase: 52 U/L (ref 38–126)
Anion gap: 7 (ref 5–15)
BUN: 70 mg/dL — ABNORMAL HIGH (ref 8–23)
CO2: 18 mmol/L — ABNORMAL LOW (ref 22–32)
Calcium: 8.2 mg/dL — ABNORMAL LOW (ref 8.9–10.3)
Chloride: 107 mmol/L (ref 98–111)
Creatinine: 3.05 mg/dL (ref 0.60–1.20)
GFR, Estimated: 15 mL/min — ABNORMAL LOW (ref >60)
Glucose, Bld: 108 mg/dL — ABNORMAL HIGH (ref 70–99)
Potassium: 5.1 mmol/L (ref 3.5–5.1)
Sodium: 132 mmol/L — ABNORMAL LOW (ref 135–145)
Total Bilirubin: 0.6 mg/dL (ref 0.3–1.2)
Total Protein: 5.9 g/dL — ABNORMAL LOW (ref 6.5–8.1)

## 2022-06-13 LAB — SAMPLE TO BLOOD BANK

## 2022-06-13 LAB — PREPARE RBC (CROSSMATCH)

## 2022-06-13 LAB — ABO/RH: ABO/RH(D): A POS

## 2022-06-13 NOTE — Progress Notes (Signed)
[facility] Follow up Visit:  Patient Care Team: Rochel Brome, MD as PCP - General (Internal Medicine) Lane Hacker, [facility] (Pharmacist) Barbee Cough, MD as Consulting Physician (Internal Medicine)  CHIEF COMPLAINTS/PURPOSE OF CONSULTATION:  Oncology History  Bladder cancer [facility])  08/05/2018 Initial Diagnosis   Bladder cancer ([place])   04/24/2022 Cancer Staging   Staging form: Urinary Bladder, AJCC 8th Edition - Clinical stage from 04/24/2022: Stage II (cT2, cN0, cM0) - Signed by Barbee Cough, MD on 04/24/2022 Histopathologic type: Transitional cell carcinoma, NOS Stage prefix: Initial diagnosis WHO/ISUP grade (low/high): High Grade Histologic grading system: 2 grade system   05/08/2022 -  Chemotherapy   Patient is on Treatment Plan : BLADDER Gemcitabine Twice Weekly + XRT       HISTORY OF PRESENTING ILLNESS: Alexandra [name] 82 y.o. female is here because of bladder cancer.   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  Superficial lesions along the right and left lateral walls of bladder.  Biopsy sites were fulgurated  September 30, 2018: Cystoscopy with bladder biopsy.  Small superficial lesions along the left lateral wall next to previous resection.  Biopsied and fulgurated  May 05, 2019 cystoscopy with bladder biopsy.  Small superficial recurrences along previous biopsy sites on lateral walls of bladder noted.  These were fulgurated  November 12, 2019 cystoscopy with bladder biopsy.  Small papillary lesion in area of right lateral wall near previous bladder resection scar..   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  Muscularis propria not present  April 27, 2021 cystoscopy with bladder biopsy.  Small papillary lesions in right posterior wall, left lateral wall and anterior wall.  Small papillary fronds at trigone.  Biopsies of the posterior wall of the bladder and bladder neck showed low-grade papillary urothelial carcinoma noninvasive   February 15, 2022: CT abdomen pelvis without contrast.  Previous left nephrectomy noted.  Mild right hydronephrosis to the level of bladder new from previous study.  Focal area of bladder wall thickening in the region of the right ureterovesicular junction raising suspicious for bladder cancer  March 15, 2022: Cystoscopy demonstrated right ureteral obstruction with hydronephrosis.  Underwent right retrograde pyelogram, dilation of ureteral stricture stent placement and TUR BT.   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  Trivial MR and TR.    March 28 2022 CMP notable for creatinine of 2.80 glucose 101 estimated GFR 16 calcium 7.2 albumin 3.1 Alk Phos 86  April 24 2022:  [facility] Consult  Social:  Retired. Factory work.  Married.  Began smoking cigarettes 20 yrs of age, 1 ppd and quit 24 yrs ago.  EtOH none  [place] Mother died 49 old age Father killed when patient was 46 yrs of age Brother died 57 MI Sister alive 33 several medical problems Sister alive 49 thyroid problems.    April 25 2022:  Chemotherapy teaching  May 07 2022:  Port placed  May 08 2022:  Day 1 Gemcitabine 27 mg/m2 IV Twice Weekly with RT   May 09 2022:  Began XRT    May 10 2022:  Feels well.  No dysuria, pyuria, frequency or urgency.  No nausea or emesis CT chest without contrast not yet performed.  ECOG 1.  Has gained 3 lbs.    May 11 2022:  Cycle 1 Day 4 Gemcitabine 27 mg/m2 IV twice weekly with XRT  May 15 2022:  Patient self reported having COVID.  XRT and Chemotherapy appointments held  May 22 2022:  Cycle 1 Day 4 Gemcitabine 27 mg/m2 IV twice weekly with XRT  May 23 2022:   Recovered from [place].  She was fully vaccinated and had minimum of symptoms, mostly fatigue.  Has sensation of bladder fullness and frequency but no hematuria, dysuria.  To see Urologist today.  Had episode of hyperkalemia (asymptomatic).  She is on entresto and eats a lot of fruit.  Instructed her to decrease fruit intake.  CT chest without contrast, MRI abdomen, and U/S not yet performed  May 30 2022:  . Still receiving XRT. Has cut down on how much fruit she eats.  Experiencing dysuria, frequency, urgency.  No hematuria.  Last week was placed on Abx and pyridium by urologist.  To undergo CT chest on Friday.    June 01 2022:  CT chest without contrast.  Minimal, bland bandlike scarring of the dependent bilateral lung bases. No acute airspace disease. Moderate emphysema and diffuse bilateral bronchial wall thickening.  Coronary artery disease.  Unchanged appearance of the upper abdomen status post left  nephrectomy, as well as a high attenuation right renal lesion  incompletely characterized by noncontrast CT of the chest.   June 06 2022:  Scheduled follow up for management of bladder cancer.  Fatigued.  Having bilateral LE edema.  Experiencing diarrhea to start on imodium.  Appetite fair.  Has gained 2 lbs but some of that may be due to edema.  Has mild nausea helped by antiemetics.   WBC 10.1 hemoglobin 8.1 MCV 103 platelet count 225 ANC 7.9 CMP notable for glucose of 104 creatinine 2.97 albumin 3.0  June 08 2022:  Gemcitabine  June 13 2022:  Fatigued.  ECOG 2 at best.  Needed wheelchair to get from waiting to exam room.  States she has 19 more fractions of XRT left.  Took diuretics yesterday to help with edema.  Has gained 3 lbs since last week.  Diuretics managed by PCP.  (Will refer back to PCP for management of edema)  Having dry heaves but no emesis.  Taking antiemetics daily.  Did not have diarrhea last night; overall improved with imodium.  Dysuria but no hematuria.  No mucositis. ECOT 2 at best  WBC 11.2 hemoglobin 7.4 platelet count 228; 87 segs 27 lymphs 3 monos 2 eos  June 15 2022:  For Gemcitabine  Review of Systems  Constitutional:  Positive for appetite change. Negative for chills, fatigue, fever and unexpected weight change.       Has bouts of nausea since cholecystectomy which interferes with appetite.    HENT:   Negative for hearing loss, mouth sores, nosebleeds, sore throat and trouble swallowing.   Eyes:  Negative for eye problems and icterus.       Vision changes:  None  Respiratory:  Negative for chest tightness, cough, hemoptysis, shortness of breath and wheezing.         Orthopnea:  Sleeps in lounge chair DOE:  walking up stairs  Cardiovascular:  Positive for leg swelling. Negative for chest pain and palpitations.       PND:  none Orthopnea:  none Leg swelling persists  Gastrointestinal:  Positive for diarrhea and nausea. Negative for abdominal pain, blood in stool, constipation and vomiting.  Endocrine:       Cold intolerance:  none Heat intolerance:  none  Genitourinary:  Positive for nocturia. Negative for difficulty urinating, dysuria, frequency and hematuria.        Nocturia x 3 to 4  Musculoskeletal:  Positive for gait problem. Negative for arthralgias, back pain and myalgias.  Skin:  Negative for itching, rash and wound.  Neurological:  Positive for gait problem. Negative for dizziness, extremity weakness, headaches, light-headedness, numbness and speech difficulty.  Hematological:   Negative for adenopathy. Does not bruise/bleed easily.  Psychiatric/Behavioral:  Negative for suicidal ideas. The patient is not nervous/anxious.        Tosses and turns at night    MEDICAL HISTORY: Past Medical History:  Diagnosis Date   COPD (chronic obstructive pulmonary disease) (HCC)    GERD (gastroesophageal reflux disease)    Hyperlipidemia    Hyperparathyroidism, primary ([place]) 06/25/2016   Hypertension    Osteoporosis    Restless legs syndrome    RLS (restless legs syndrome)     SURGICAL HISTORY: Past Surgical History:  Procedure Laterality Date   ABDOMINAL HYSTERECTOMY     complete: menorrhagia. 82 yo   BACK SURGERY  2016   lower L 4 to L 5   bladder tack and uterus lift  10/10/2015   CHOLECYSTECTOMY     summer of 2022   IR RADIOLOGIST EVAL & MGMT  12/03/2018   IR RADIOLOGIST EVAL & MGMT  12/31/2018   PARATHYROIDECTOMY Left 07/02/2016   Procedure: LEFT INFERIOR PARATHYROIDECTOMY;  Surgeon: Armandina Gemma, MD;  Location: [facility];  Service: General;  Laterality: Left;   SHOULDER OPEN ROTATOR CUFF REPAIR  12/2019   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    SOCIAL HISTORY: Social History   Socioeconomic History   Marital status: Married    Spouse name: Herbie Baltimore   Number of children: 2   Years of education: 11   Highest education level: 11th grade  Occupational History   Occupation: Retired  Tobacco Use   Smoking status: Former    Packs/day: 1.00    Years: 50.00    Total pack years: 50.00    Types: Cigarettes    Quit date: 06/11/2005    Years since quitting: 17.0   Smokeless tobacco: Never  Vaping Use   Vaping Use: Never used  Substance and Sexual Activity   Alcohol use: No   Drug use: No   Sexual activity: Yes    Partners: Male  Other Topics Concern   Not on file  Social History Narrative   Not on file   Social Determinants of Health   Financial Resource Strain: Medium Risk (05/01/2022)   Overall Financial Resource Strain (CARDIA)     Difficulty of Paying Living Expenses: Somewhat hard  Food Insecurity: No Food Insecurity (03/27/2022)   Hunger Vital Sign    Worried About Running Out of Food in the Last Year: Never true    Prosperity in the Last Year: Never true  Transportation Needs: No Transportation Needs (05/01/2022)   PRAPARE - Hydrologist (Medical): No    Lack of Transportation (Non-Medical): No  Physical Activity: Inactive (02/15/2022)   Exercise Vital Sign    Days of Exercise per Week: 0 days    Minutes of Exercise per Session: 0 min  Stress: No Stress Concern Present (02/15/2022)   [place]    Feeling of Stress : Only a little  Social Connections: Moderately Integrated (02/15/2022)   Social Connection and Isolation Panel [NHANES]    Frequency of Communication with Friends and Family: More than three times a week    Frequency of Social Gatherings with Friends and Family: More than three times a week    Attends Religious Services: More than 4 times per year    Active Member of [employer] or Organizations: No    Attends Archivist Meetings: Never    Marital Status: Married  Human resources officer Violence: Not At Risk (02/15/2022)   Humiliation, Afraid, Rape, and Kick questionnaire    Fear of Current or Ex-Partner: No    Emotionally Abused: No    Physically Abused: No    Sexually Abused: No    FAMILY HISTORY Family History  Problem Relation Age of Onset   Emphysema Mother    Hyperlipidemia Sister    Hypertension Sister    COPD Sister    Hyperlipidemia Sister    Hypertension Sister    Lymphoma Sister    Obesity Brother    Hyperlipidemia Brother    Hypertension Brother    Breast cancer Neg Hx     ALLERGIES:  is allergic to contrast media [iodinated contrast media], omeprazole, shellfish allergy, shellfish-derived products, and zofran [ondansetron hcl].  MEDICATIONS:  Current Outpatient Medications   Medication Sig Dispense Refill   atorvastatin (LIPITOR) 40 MG tablet Take 1 tablet (40 mg total) by mouth daily. Per ccm 90 tablet 3   Budeson-Glycopyrrol-Formoterol (BREZTRI AEROSPHERE) 160-9-4.8 MCG/ACT AERO Inhale 2 puffs into the lungs in the morning and at bedtime. 10.7 g 6   metoprolol succinate (TOPROL-XL) 25 MG 24 hr tablet Take 25 mg by mouth daily.     prochlorperazine (COMPAZINE) 10 MG tablet Take 1 tablet (10 mg total) by mouth every 6 (six) hours as needed for nausea or vomiting. 30 tablet 1   rOPINIRole (REQUIP) 2 MG tablet TAKE 1 TABLET(2 MG) BY MOUTH IN THE MORNING AND AT BEDTIME 180 tablet 6   sacubitril-valsartan (ENTRESTO) 24-26 MG Take 1 tablet by mouth 2 (two) times daily. 60 tablet 6   sodium bicarbonate 650 MG tablet Take 650 mg by mouth 3 (three) times daily.     torsemide (DEMADEX) 20 MG tablet Take 1 tablet (20 mg total) by mouth daily. 30 tablet 3   VENTOLIN HFA 108 (90 Base) MCG/ACT inhaler      No current facility-administered medications for this visit.    PHYSICAL EXAMINATION:  ECOG PERFORMANCE STATUS: 1 - Symptomatic but completely ambulatory   There were no vitals filed for this visit.    There were no vitals filed for this visit.     Physical Exam Vitals and nursing note reviewed.  Constitutional:      General: She is not in acute distress.    Appearance: Normal appearance. She is normal weight. She is not ill-appearing, toxic-appearing or diaphoretic.     Comments: Here alone.  Elderly .  Tired appearing.  Seated in wheelchair.    HENT:     Head: Normocephalic and atraumatic.     Right Ear: External ear normal.     Left Ear: External ear  normal.     Nose: Nose normal. No congestion or rhinorrhea.  Eyes:     General: No scleral icterus.    Extraocular Movements: Extraocular movements intact.     Conjunctiva/sclera: Conjunctivae normal.     Pupils: Pupils are equal, round, and reactive to light.  Cardiovascular:     Rate and Rhythm:  Normal rate and regular rhythm.     Heart sounds: No murmur heard.    No friction rub. No gallop.  Pulmonary:     Effort: Pulmonary effort is normal. No respiratory distress.     Breath sounds: Normal breath sounds. No stridor. No wheezing, rhonchi or rales.  Abdominal:     General: Bowel sounds are normal.     Palpations: Abdomen is soft.     Tenderness: There is no abdominal tenderness. There is no guarding or rebound.  Musculoskeletal:        General: No swelling, tenderness or deformity.     Cervical back: Normal range of motion and neck supple. No rigidity or tenderness.     Right lower leg: Edema present.     Left lower leg: Edema present.  Lymphadenopathy:     Head:     Right side of head: No submental, submandibular, tonsillar, preauricular, posterior auricular or occipital adenopathy.     Left side of head: No submental, submandibular, tonsillar, preauricular, posterior auricular or occipital adenopathy.     Cervical: No cervical adenopathy.     Right cervical: No superficial, deep or posterior cervical adenopathy.    Left cervical: No superficial, deep or posterior cervical adenopathy.     Upper Body:     Right upper body: No supraclavicular, axillary, pectoral or epitrochlear adenopathy.     Left upper body: No supraclavicular, axillary, pectoral or epitrochlear adenopathy.  Skin:    General: Skin is warm.     Coloration: Skin is not jaundiced or pale.     Findings: No bruising or erythema.  Neurological:     General: No focal deficit present.     Mental Status: She is alert and oriented to person, place, and time.     Cranial Nerves: No cranial nerve deficit.     Gait: Gait normal.  Psychiatric:        Mood and Affect: Mood normal.        Behavior: Behavior normal.        Thought Content: Thought content normal.        Judgment: Judgment normal.     LABORATORY DATA: I have personally reviewed the data as listed:  Appointment on 06/06/2022  Component Date  Value Ref Range Status   Sodium 06/06/2022 139  135 - 145 mmol/L Final   Potassium 06/06/2022 4.9  3.5 - 5.1 mmol/L Final   Chloride 06/06/2022 112 (H)  98 - 111 mmol/L Final   CO2 06/06/2022 19 (L)  22 - 32 mmol/L Final   Glucose, Bld 06/06/2022 104 (H)  70 - 99 mg/dL Final   Glucose reference range applies only to samples taken after fasting for at least 8 hours.   BUN 06/06/2022 55 (H)  8 - 23 mg/dL Final   Creatinine 06/06/2022 2.97 (H)  0.44 - 1.00 mg/dL Final   Calcium 06/06/2022 8.3 (L)  8.9 - 10.3 mg/dL Final   Total Protein 06/06/2022 5.6 (L)  6.5 - 8.1 g/dL Final   Albumin 06/06/2022 3.0 (L)  3.5 - 5.0 g/dL Final   AST 06/06/2022 32  15 - 41 U/L Final  ALT 06/06/2022 29  0 - 44 U/L Final   Alkaline Phosphatase 06/06/2022 49  38 - 126 U/L Final   Total Bilirubin 06/06/2022 0.4  0.3 - 1.2 mg/dL Final   GFR, Estimated 06/06/2022 16 (L)  >60 mL/min Final   Comment: (NOTE) Calculated using the CKD-EPI Creatinine Equation (2021)    Anion gap 06/06/2022 8  5 - 15 Final   Performed at [facility], [place] [address] [place], [place] [address]   WBC Count 06/06/2022 10.1  4.0 - 10.5 K/uL Final   RBC 06/06/2022 2.59 (L)  3.87 - 5.11 MIL/uL Final   Hemoglobin 06/06/2022 8.1 (L)  12.0 - 15.0 g/dL Final   HCT 06/06/2022 26.6 (L)  36.0 - 46.0 % Final   MCV 06/06/2022 102.7 (H)  80.0 - 100.0 fL Final   MCH 06/06/2022 31.3  26.0 - 34.0 pg Final   MCHC 06/06/2022 30.5  30.0 - 36.0 g/dL Final   RDW 06/06/2022 15.4  11.5 - 15.5 % Final   Platelet Count 06/06/2022 225  150 - 400 K/uL Final   nRBC 06/06/2022 0.2  0.0 - 0.2 % Final   Neutrophils Relative % 06/06/2022 77  % Final   Neutro Abs 06/06/2022 7.9 (H)  1.7 - 7.7 K/uL Final   Lymphocytes Relative 06/06/2022 13  % Final   Lymphs Abs 06/06/2022 1.3  0.7 - 4.0 K/uL Final   Monocytes Relative 06/06/2022 6  % Final   Monocytes Absolute 06/06/2022 0.6  0.1 - 1.0 K/uL Final   Eosinophils Relative 06/06/2022 3  % Final    Eosinophils Absolute 06/06/2022 0.3  0.0 - 0.5 K/uL Final   Basophils Relative 06/06/2022 0  % Final   Basophils Absolute 06/06/2022 0.0  0.0 - 0.1 K/uL Final   Immature Granulocytes 06/06/2022 1  % Final   Abs Immature Granulocytes 06/06/2022 0.08 (H)  0.00 - 0.07 K/uL Final   Performed at [place] [address] [place], [place] [address]  Appointment on 05/30/2022  Component Date Value Ref Range Status   WBC Count 05/30/2022 10.0  4.0 - 10.5 K/uL Final   RBC 05/30/2022 2.82 (L)  3.87 - 5.11 MIL/uL Final   Hemoglobin 05/30/2022 8.6 (L)  12.0 - 15.0 g/dL Final   HCT 05/30/2022 28.8 (L)  36.0 - 46.0 % Final   MCV 05/30/2022 102.1 (H)  80.0 - 100.0 fL Final   MCH 05/30/2022 30.5  26.0 - 34.0 pg Final   MCHC 05/30/2022 29.9 (L)  30.0 - 36.0 g/dL Final   RDW 05/30/2022 15.5  11.5 - 15.5 % Final   Platelet Count 05/30/2022 360  150 - 400 K/uL Final   nRBC 05/30/2022 0.0  0.0 - 0.2 % Final   Neutrophils Relative % 05/30/2022 71  % Final   Neutro Abs 05/30/2022 7.1  1.7 - 7.7 K/uL Final   Lymphocytes Relative 05/30/2022 19  % Final   Lymphs Abs 05/30/2022 1.9  0.7 - 4.0 K/uL Final   Monocytes Relative 05/30/2022 7  % Final   Monocytes Absolute 05/30/2022 0.7  0.1 - 1.0 K/uL Final   Eosinophils Relative 05/30/2022 2  % Final   Eosinophils Absolute 05/30/2022 0.2  0.0 - 0.5 K/uL Final   Basophils Relative 05/30/2022 0  % Final   Basophils Absolute 05/30/2022 0.0  0.0 - 0.1 K/uL Final   Immature Granulocytes 05/30/2022 1  % Final   Abs Immature Granulocytes 05/30/2022 0.09 (H)  0.00 - 0.07 K/uL  Final   Performed at [facility], [place] [address], [place], [place] [address]   Sodium 05/30/2022 136  135 - 145 mmol/L Final   Potassium 05/30/2022 5.4 (H)  3.5 - 5.1 mmol/L Final   Chloride 05/30/2022 109  98 - 111 mmol/L Final   CO2 05/30/2022 20 (L)  22 - 32 mmol/L Final   Glucose, Bld 05/30/2022 95  70 - 99 mg/dL Final   Glucose reference range  applies only to samples taken after fasting for at least 8 hours.   BUN 05/30/2022 57 (H)  8 - 23 mg/dL Final   Creatinine 05/30/2022 2.58 (H)  0.44 - 1.00 mg/dL Final   Calcium 05/30/2022 8.6 (L)  8.9 - 10.3 mg/dL Final   Total Protein 05/30/2022 6.5  6.5 - 8.1 g/dL Final   Albumin 05/30/2022 3.2 (L)  3.5 - 5.0 g/dL Final   AST 05/30/2022 18  15 - 41 U/L Final   ALT 05/30/2022 11  0 - 44 U/L Final   Alkaline Phosphatase 05/30/2022 64  38 - 126 U/L Final   Total Bilirubin 05/30/2022 0.5  0.3 - 1.2 mg/dL Final   GFR, Estimated 05/30/2022 18 (L)  >60 mL/min Final   Comment: (NOTE) Calculated using the CKD-EPI Creatinine Equation (2021)    Anion gap 05/30/2022 7  5 - 15 Final   Performed at [facility], [place] [address], [place], [place] [address]   Color, Urine 05/30/2022 YELLOW  YELLOW Final   APPearance 05/30/2022 HAZY (A)  CLEAR Final   Specific Gravity, Urine 05/30/2022 1.014  1.005 - 1.030 Final   pH 05/30/2022 5.0  5.0 - 8.0 Final   Glucose, UA 05/30/2022 NEGATIVE  NEGATIVE mg/dL Final   Hgb urine dipstick 05/30/2022 MODERATE (A)  NEGATIVE Final   Bilirubin Urine 05/30/2022 NEGATIVE  NEGATIVE Final   Ketones, ur 05/30/2022 NEGATIVE  NEGATIVE mg/dL Final   Protein, ur 05/30/2022 100 (A)  NEGATIVE mg/dL Final   Nitrite 05/30/2022 NEGATIVE  NEGATIVE Final   Leukocytes,Ua 05/30/2022 LARGE (A)  NEGATIVE Final   RBC / HPF 05/30/2022 >50 (H)  0 - 5 RBC/hpf Final   WBC, UA 05/30/2022 >50 (H)  0 - 5 WBC/hpf Final   Bacteria, UA 05/30/2022 RARE (A)  NONE SEEN Final   Squamous Epithelial / LPF 05/30/2022 0-5  0 - 5 Final   Mucus 05/30/2022 PRESENT   Final   Performed at [facility], [place] [address] [place], [place] [address]  Clinical Support on 05/23/2022  Component Date Value Ref Range Status   Sodium 05/23/2022 138  135 - 145 mmol/L Final   Potassium 05/23/2022 5.2 (H)  3.5 - 5.1 mmol/L Final   Chloride 05/23/2022 107  98 - 111 mmol/L Final    CO2 05/23/2022 22  22 - 32 mmol/L Final   Glucose, Bld 05/23/2022 107 (H)  70 - 99 mg/dL Final   Glucose reference range applies only to samples taken after fasting for at least 8 hours.   BUN 05/23/2022 51 (H)  8 - 23 mg/dL Final   Creatinine 05/23/2022 2.69 (H)  0.44 - 1.00 mg/dL Final   Calcium 05/23/2022 9.0  8.9 - 10.3 mg/dL Final   Total Protein 05/23/2022 6.5  6.5 - 8.1 g/dL Final   Albumin 05/23/2022 2.9 (L)  3.5 - 5.0 g/dL Final   AST 05/23/2022 18  15 - 41 U/L Final   ALT 05/23/2022 14  0 - 44 U/L Final  Alkaline Phosphatase 05/23/2022 64  38 - 126 U/L Final   Total Bilirubin 05/23/2022 0.7  0.3 - 1.2 mg/dL Final   GFR, Estimated 05/23/2022 17 (L)  >60 mL/min Final   Comment: (NOTE) Calculated using the CKD-EPI Creatinine Equation (2021)    Anion gap 05/23/2022 9  5 - 15 Final   Performed at [facility], [place] [address], [place], [place] [address]   WBC Count 05/23/2022 10.2  4.0 - 10.5 K/uL Final   RBC 05/23/2022 3.20 (L)  3.87 - 5.11 MIL/uL Final   Hemoglobin 05/23/2022 9.8 (L)  12.0 - 15.0 g/dL Final   HCT 05/23/2022 32.6 (L)  36.0 - 46.0 % Final   MCV 05/23/2022 101.9 (H)  80.0 - 100.0 fL Final   MCH 05/23/2022 30.6  26.0 - 34.0 pg Final   MCHC 05/23/2022 30.1  30.0 - 36.0 g/dL Final   RDW 05/23/2022 15.2  11.5 - 15.5 % Final   Platelet Count 05/23/2022 386  150 - 400 K/uL Final   nRBC 05/23/2022 0.0  0.0 - 0.2 % Final   Neutrophils Relative % 05/23/2022 70  % Final   Neutro Abs 05/23/2022 7.1  1.7 - 7.7 K/uL Final   Lymphocytes Relative 05/23/2022 20  % Final   Lymphs Abs 05/23/2022 2.1  0.7 - 4.0 K/uL Final   Monocytes Relative 05/23/2022 7  % Final   Monocytes Absolute 05/23/2022 0.7  0.1 - 1.0 K/uL Final   Eosinophils Relative 05/23/2022 2  % Final   Eosinophils Absolute 05/23/2022 0.2  0.0 - 0.5 K/uL Final   Basophils Relative 05/23/2022 0  % Final   Basophils Absolute 05/23/2022 0.0  0.0 - 0.1 K/uL Final   Immature Granulocytes 05/23/2022  1  % Final   Abs Immature Granulocytes 05/23/2022 0.07  0.00 - 0.07 K/uL Final   Performed at [facility], [place] [address] [place], [place] [address]  Appointment on 05/21/2022  Component Date Value Ref Range Status   WBC Count 05/21/2022 10.2  4.0 - 10.5 K/uL Final   RBC 05/21/2022 3.29 (L)  3.87 - 5.11 MIL/uL Final   Hemoglobin 05/21/2022 10.2 (L)  12.0 - 15.0 g/dL Final   HCT 05/21/2022 33.6 (L)  36.0 - 46.0 % Final   MCV 05/21/2022 102.1 (H)  80.0 - 100.0 fL Final   MCH 05/21/2022 31.0  26.0 - 34.0 pg Final   MCHC 05/21/2022 30.4  30.0 - 36.0 g/dL Final   RDW 05/21/2022 15.3  11.5 - 15.5 % Final   Platelet Count 05/21/2022 329  150 - 400 K/uL Final   nRBC 05/21/2022 0.0  0.0 - 0.2 % Final   Neutrophils Relative % 05/21/2022 65  % Final   Neutro Abs 05/21/2022 6.7  1.7 - 7.7 K/uL Final   Lymphocytes Relative 05/21/2022 23  % Final   Lymphs Abs 05/21/2022 2.3  0.7 - 4.0 K/uL Final   Monocytes Relative 05/21/2022 8  % Final   Monocytes Absolute 05/21/2022 0.8  0.1 - 1.0 K/uL Final   Eosinophils Relative 05/21/2022 2  % Final   Eosinophils Absolute 05/21/2022 0.2  0.0 - 0.5 K/uL Final   Basophils Relative 05/21/2022 0  % Final   Basophils Absolute 05/21/2022 0.0  0.0 - 0.1 K/uL Final   Immature Granulocytes 05/21/2022 2  % Final   Abs Immature Granulocytes 05/21/2022 0.17 (H)  0.00 - 0.07 K/uL Final   Performed at [facility], [address] [place],  [place] [address]   Sodium 05/21/2022 140  135 - 145 mmol/L Final   Potassium 05/21/2022 6.0 (H)  3.5 - 5.1 mmol/L Final   Chloride 05/21/2022 109  98 - 111 mmol/L Final   CO2 05/21/2022 24  22 - 32 mmol/L Final   Glucose, Bld 05/21/2022 113 (H)  70 - 99 mg/dL Final   Glucose reference range applies only to samples taken after fasting for at least 8 hours.   BUN 05/21/2022 42 (H)  8 - 23 mg/dL Final   Creatinine 05/21/2022 2.62 (H)  0.44 - 1.00 mg/dL Final   Calcium 05/21/2022 9.2  8.9 - 10.3  mg/dL Final   Total Protein 05/21/2022 6.6  6.5 - 8.1 g/dL Final   Albumin 05/21/2022 3.2 (L)  3.5 - 5.0 g/dL Final   AST 05/21/2022 21  15 - 41 U/L Final   ALT 05/21/2022 18  0 - 44 U/L Final   Alkaline Phosphatase 05/21/2022 73  38 - 126 U/L Final   Total Bilirubin 05/21/2022 0.6  0.3 - 1.2 mg/dL Final   GFR, Estimated 05/21/2022 18 (L)  >60 mL/min Final   Comment: (NOTE) Calculated using the CKD-EPI Creatinine Equation (2021)    Anion gap 05/21/2022 7  5 - 15 Final   Performed at [facility], [place] [address] [place], [place] [address]    RADIOGRAPHIC STUDIES: I have personally reviewed the radiological images as listed and agree with the findings in the report  No results found.  ASSESSMENT/PLAN  82 y.o. female with medical history notable for COPD, GERD, hyperlipidemia, hyperparathyroidism, hypertension, osteoporosis, restless leg syndrome for chronic kidney disease, CT contrast allergy, gout, CKD, congestive heart failure.  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  Focal area of bladder wall thickening in the region of the right ureterovesicular junction raising suspicious for bladder cancer  October 2023:  Cystoscopy. Right ureteral obstruction with hydronephrosis.   Stent placed.  Biopsied.    June 01 2022:   CT chest.  No lung metastasis Will still need renal U/S and MRI as these were likely affected by recent covid dx             Major risk factor is long history of tobacco use   Will still need renal U/S and  MRI as these were likely affected by recent covid dx  Major risk factor is long history of tobacco use  Renal cell carcinoma, Left kidney:  Treated with nephrectomy in 2003  Chronic Kidney Disease:  Multifactorial with largest component being nephrectomy.  Contributing factors are HTN, age, CHF.  This has been a contributing factor to decision making with regard to therapeutics.  Not candidate for platinum because of this.  Also decreases gemcitabine clearance.  Contributing to edema.  (Referring patient back to PCP for management of edema)  Therapeutics:  Dictated by age, comorbidities especially CKD which excludes consideration of cystectomy and platinum.  Has been evaluated by Radiation Oncology.    Received chemotherapy teaching May 08 2022:  Day 1 Gemcitabine 27 mg/m2 IV Twice Weekly  with RT  Treatment was interrupted due to self limited COVID infection June 08 2022:  Received Gemcitabine June 15 2022:  Will receive Gemcitabine with XRT and hold on further does until after XRT.  Having significant fatigue and decline in performance status such that I am concerned that if Gemcitabine is continued it may affect ability to complete local therapy with XRT.    Fatigue:  Multifactorial.  Due to chemotherapy, XRT, CKD, frail elderly status.  Anemia   Anemia:  Multifactorial.  Secondary to chemo/xrt, ckd.  Transfuse for Hgb < 8.0  June 13 2021:  Hgb 7.4 and symptomatic.  Will arrange for 2 units PRBC's.    Hyperkalemia:  Secondary to CKD, entresto and diet.  Discussed management.  Will check CMP today  Poor venous access:  Has undergone port placement.   Pelvic pressure:  Due to XRT + chemotherapy.    History of kidney and bladder cancer:  Refered to genetics.     Cancer Staging  Bladder cancer [facility]) Staging form: Urinary Bladder, AJCC 8th Edition - Clinical stage from 04/24/2022: Stage II (cT2, cN0, cM0) - Signed by Barbee Cough, MD on 04/24/2022 Histopathologic type:  Transitional cell carcinoma, NOS Stage prefix: Initial diagnosis WHO/ISUP grade (low/high): High Grade Histologic grading system: 2 grade system    No problem-specific Assessment & Plan notes found for this encounter.   No orders of the defined types were placed in this encounter.  43  minutes was spent in patient care.  This included time spent preparing to see the patient (e.g., review of tests), obtaining history, counseling and educating the patient  ordering tests, documenting clinical information in the electronic or other health record, independently interpreting results and communicating results to the patient as well as coordination of care.      All questions were answered. The patient knows to call the clinic with any problems, questions or concerns.    This note was electronically signed.    Barbee Cough, MD  06/13/2022 10:11 AM

## 2022-06-14 ENCOUNTER — Encounter: Payer: Self-pay | Admitting: Oncology

## 2022-06-14 ENCOUNTER — Other Ambulatory Visit: Payer: Self-pay | Admitting: Pharmacist

## 2022-06-14 ENCOUNTER — Inpatient Hospital Stay: Payer: Medicare Other

## 2022-06-14 DIAGNOSIS — K209 Esophagitis, unspecified without bleeding: Secondary | ICD-10-CM | POA: Diagnosis not present

## 2022-06-14 DIAGNOSIS — Z79899 Other long term (current) drug therapy: Secondary | ICD-10-CM | POA: Diagnosis not present

## 2022-06-14 DIAGNOSIS — D631 Anemia in chronic kidney disease: Secondary | ICD-10-CM | POA: Diagnosis not present

## 2022-06-14 DIAGNOSIS — R112 Nausea with vomiting, unspecified: Secondary | ICD-10-CM | POA: Diagnosis not present

## 2022-06-14 DIAGNOSIS — R0789 Other chest pain: Secondary | ICD-10-CM | POA: Diagnosis not present

## 2022-06-14 DIAGNOSIS — Z5111 Encounter for antineoplastic chemotherapy: Secondary | ICD-10-CM | POA: Diagnosis not present

## 2022-06-14 DIAGNOSIS — M810 Age-related osteoporosis without current pathological fracture: Secondary | ICD-10-CM | POA: Diagnosis not present

## 2022-06-14 DIAGNOSIS — J029 Acute pharyngitis, unspecified: Secondary | ICD-10-CM | POA: Diagnosis not present

## 2022-06-14 DIAGNOSIS — C679 Malignant neoplasm of bladder, unspecified: Secondary | ICD-10-CM

## 2022-06-14 DIAGNOSIS — R0603 Acute respiratory distress: Secondary | ICD-10-CM | POA: Diagnosis not present

## 2022-06-14 DIAGNOSIS — M542 Cervicalgia: Secondary | ICD-10-CM | POA: Diagnosis not present

## 2022-06-14 DIAGNOSIS — R070 Pain in throat: Secondary | ICD-10-CM | POA: Diagnosis not present

## 2022-06-14 DIAGNOSIS — J9811 Atelectasis: Secondary | ICD-10-CM | POA: Diagnosis not present

## 2022-06-14 DIAGNOSIS — I130 Hypertensive heart and chronic kidney disease with heart failure and stage 1 through stage 4 chronic kidney disease, or unspecified chronic kidney disease: Secondary | ICD-10-CM | POA: Diagnosis not present

## 2022-06-14 DIAGNOSIS — J439 Emphysema, unspecified: Secondary | ICD-10-CM | POA: Diagnosis not present

## 2022-06-14 DIAGNOSIS — I509 Heart failure, unspecified: Secondary | ICD-10-CM | POA: Diagnosis not present

## 2022-06-14 DIAGNOSIS — R079 Chest pain, unspecified: Secondary | ICD-10-CM | POA: Diagnosis not present

## 2022-06-14 DIAGNOSIS — I110 Hypertensive heart disease with heart failure: Secondary | ICD-10-CM | POA: Diagnosis not present

## 2022-06-14 DIAGNOSIS — D6481 Anemia due to antineoplastic chemotherapy: Secondary | ICD-10-CM

## 2022-06-14 DIAGNOSIS — Z79891 Long term (current) use of opiate analgesic: Secondary | ICD-10-CM | POA: Diagnosis not present

## 2022-06-14 DIAGNOSIS — T451X5A Adverse effect of antineoplastic and immunosuppressive drugs, initial encounter: Secondary | ICD-10-CM

## 2022-06-14 DIAGNOSIS — C678 Malignant neoplasm of overlapping sites of bladder: Secondary | ICD-10-CM | POA: Diagnosis not present

## 2022-06-14 DIAGNOSIS — N189 Chronic kidney disease, unspecified: Secondary | ICD-10-CM | POA: Diagnosis not present

## 2022-06-14 MED ORDER — SODIUM CHLORIDE 0.9% FLUSH
10.0000 mL | Freq: Once | INTRAVENOUS | Status: AC | PRN
  Administered 2022-06-14: 10 mL

## 2022-06-14 MED ORDER — ACETAMINOPHEN 325 MG PO TABS
650.0000 mg | ORAL_TABLET | Freq: Once | ORAL | Status: AC
  Administered 2022-06-14: 650 mg via ORAL
  Filled 2022-06-14: qty 2

## 2022-06-14 MED ORDER — LIDOCAINE HCL 1 % IJ SOLN
5.0000 mL | Freq: Once | INTRAMUSCULAR | Status: AC
  Administered 2022-06-14: 5 mL
  Filled 2022-06-14: qty 20

## 2022-06-14 MED ORDER — SODIUM CHLORIDE 0.9% IV SOLUTION
250.0000 mL | Freq: Once | INTRAVENOUS | Status: AC
  Administered 2022-06-14: 250 mL via INTRAVENOUS

## 2022-06-14 MED ORDER — DIPHENHYDRAMINE HCL 25 MG PO CAPS
25.0000 mg | ORAL_CAPSULE | Freq: Once | ORAL | Status: AC
  Administered 2022-06-14: 25 mg via ORAL
  Filled 2022-06-14: qty 1

## 2022-06-14 MED ORDER — CEFTRIAXONE SODIUM 1 G IJ SOLR
1.0000 g | Freq: Once | INTRAMUSCULAR | Status: AC
  Administered 2022-06-14: 1 g via INTRAMUSCULAR
  Filled 2022-06-14: qty 10

## 2022-06-14 MED ORDER — HEPARIN SOD (PORK) LOCK FLUSH 100 UNIT/ML IV SOLN
500.0000 [IU] | Freq: Once | INTRAVENOUS | Status: AC | PRN
  Administered 2022-06-14: 500 [IU]

## 2022-06-14 MED ORDER — MAGNESIUM SULFATE 2 GM/50ML IV SOLN
2.0000 g | Freq: Once | INTRAVENOUS | Status: AC
  Administered 2022-06-14: 2 g via INTRAVENOUS

## 2022-06-14 MED ORDER — LIDOCAINE HCL (PF) 1 % IJ SOLN
INTRAMUSCULAR | Status: AC
  Filled 2022-06-14: qty 5

## 2022-06-14 MED ORDER — SODIUM CHLORIDE 0.9 % IV SOLN: Freq: Once | INTRAVENOUS | Status: AC

## 2022-06-14 MED ORDER — MAGNESIUM SULFATE 2 GM/50ML IV SOLN
2.0000 g | Freq: Once | INTRAVENOUS | Status: AC
  Administered 2022-06-14: 2 g via INTRAVENOUS
  Filled 2022-06-14: qty 50

## 2022-06-14 NOTE — Progress Notes (Signed)
0950-pt received via wheelchair. Daughter present.  States pt was just d/c from the emergency room at [facility].  Pt states she had dry heaves last night which made her throat sore and difficult to swallow.  Pt short of breath with minimal exertion.  Resp 24.  O2 sats 97% on room air.  Pt in single room.  Curtain pulled and pt allowed to remove mask 1520-discussed with pt's daughter upon discharge, per Thedore Pickel phy, rph, ptt needs to hold her BP meds for the rest of today. If still low tomorrow, she will need to contact her cardiologist/PCP for recommendations.

## 2022-06-14 NOTE — Patient Instructions (Signed)
Magnesium Sulfate Injection What is this medication? MAGNESIUM SULFATE (mag NEE zee um SUL fate) prevents and treats low levels of magnesium in your body. It may also be used to prevent and treat seizures during pregnancy in people with high blood pressure disorders, such as preeclampsia or eclampsia. Magnesium plays an important role in maintaining the health of your muscles and nervous system. This medicine may be used for other purposes; ask your health care provider or pharmacist if you have questions. What should I tell my care team before I take this medication? They need to know if you have any of these conditions: Heart disease History of irregular heart beat Kidney disease An unusual or allergic reaction to magnesium sulfate, medications, foods, dyes, or preservatives Pregnant or trying to get pregnant Breast-feeding How should I use this medication? This medication is for infusion into a vein. It is given in a hospital or clinic setting. Talk to your care team about the use of this medication in children. While this medication may be prescribed for selected conditions, precautions do apply. Overdosage: If you think you have taken too much of this medicine contact a poison control center or emergency room at once. NOTE: This medicine is only for you. Do not share this medicine with others. What if I miss a dose? This does not apply. What may interact with this medication? Certain medications for anxiety or sleep Certain medications for seizures, such phenobarbital Digoxin Medications that relax muscles for surgery Narcotic medications for pain This list may not describe all possible interactions. Give your health care provider a list of all the medicines, herbs, non-prescription drugs, or dietary supplements you use. Also tell them if you smoke, drink alcohol, or use illegal drugs. Some items may interact with your medicine. What should I watch for while using this  medication? Your condition will be monitored carefully while you are receiving this medication. You may need blood work done while you are receiving this medication. What side effects may I notice from receiving this medication? Side effects that you should report to your care team as soon as possible: Allergic reactions--skin rash, itching, hives, swelling of the face, lips, tongue, or throat High magnesium level--confusion, drowsiness, facial flushing, redness, sweating, muscle weakness, fast or irregular heartbeat, trouble breathing Low blood pressure--dizziness, feeling faint or lightheaded, blurry vision Side effects that usually do not require medical attention (report to your care team if they continue or are bothersome): Headache Nausea This list may not describe all possible side effects. Call your doctor for medical advice about side effects. You may report side effects to FDA at 1-800-FDA-1088. Where should I keep my medication? This medication is given in a hospital or clinic and will not be stored at home. NOTE: This sheet is a summary. It may not cover all possible information. If you have questions about this medicine, talk to your doctor, pharmacist, or health care provider.  2023 Elsevier/Gold Standard (2012-10-03 00:00:00) Blood Transfusion, Adult, Care After The following information offers guidance on how to care for yourself after your procedure. Your health care provider may also give you more specific instructions. If you have problems or questions, contact your health care provider. What can I expect after the procedure? After the procedure, it is common to have: Bruising and soreness where the IV was inserted. A headache. Follow these instructions at home: IV insertion site care     Follow instructions from your health care provider about how to take care of your IV  insertion site. Make sure you: Wash your hands with soap and water for at least 20 seconds before  and after you change your bandage (dressing). If soap and water are not available, use hand sanitizer. Change your dressing as told by your health care provider. Check your IV insertion site every day for signs of infection. Check for: Redness, swelling, or pain. Bleeding from the site. Warmth. Pus or a bad smell. General instructions Take over-the-counter and prescription medicines only as told by your health care provider. Rest as told by your health care provider. Return to your normal activities as told by your health care provider. Keep all follow-up visits. Lab tests may need to be done at certain periods to recheck your blood counts. Contact a health care provider if: You have itching or red, swollen areas of skin (hives). You have a fever or chills. You have pain in the head, back, or chest. You feel anxious or you feel weak after doing your normal activities. You have redness, swelling, warmth, or pain around the IV insertion site. You have blood coming from the IV insertion site that does not stop with pressure. You have pus or a bad smell coming from your IV insertion site. If you received your blood transfusion in an outpatient setting, you will be told whom to contact to report any reactions. Get help right away if: You have symptoms of a serious allergic or immune system reaction, including: Trouble breathing or shortness of breath. Swelling of the face, feeling flushed, or widespread rash. Dark urine or blood in the urine. Fast heartbeat. These symptoms may be an emergency. Get help right away. Call 911. Do not wait to see if the symptoms will go away. Do not drive yourself to the hospital. Summary Bruising and soreness around the IV insertion site are common. Check your IV insertion site every day for signs of infection. Rest as told by your health care provider. Return to your normal activities as told by your health care provider. Get help right away for symptoms  of a serious allergic or immune system reaction to the blood transfusion. This information is not intended to replace advice given to you by your health care provider. Make sure you discuss any questions you have with your health care provider. Document Revised: 08/25/2021 Document Reviewed: 08/25/2021 Elsevier Patient Education  [place].

## 2022-06-15 ENCOUNTER — Encounter: Payer: Medicare Other | Admitting: Genetic Counselor

## 2022-06-15 ENCOUNTER — Inpatient Hospital Stay: Payer: Medicare Other

## 2022-06-15 ENCOUNTER — Telehealth: Payer: Self-pay

## 2022-06-15 LAB — BPAM RBC
Blood Product Expiration Date: 202401252359
Blood Product Expiration Date: 202401252359
ISSUE DATE / TIME: 202401040716
ISSUE DATE / TIME: 202401040716
Unit Type and Rh: 6200
Unit Type and Rh: 6200

## 2022-06-15 LAB — TYPE AND SCREEN
ABO/RH(D): A POS
Antibody Screen: NEGATIVE
Unit division: 0
Unit division: 0

## 2022-06-15 NOTE — Telephone Encounter (Signed)
Could not reach patient on home phone, called her daughter Vaughan Basta.  Vaughan Basta said patient is still weak and does not want to do radiation today.   I told her that we would be resuming radiation on Monday at 0830.  Vaughan Basta said she would let her mom know.

## 2022-06-18 ENCOUNTER — Encounter: Payer: Self-pay | Admitting: Oncology

## 2022-06-19 ENCOUNTER — Telehealth: Payer: Self-pay

## 2022-06-19 ENCOUNTER — Ambulatory Visit: Payer: Medicare Other

## 2022-06-19 DIAGNOSIS — E7800 Pure hypercholesterolemia, unspecified: Secondary | ICD-10-CM | POA: Diagnosis not present

## 2022-06-19 DIAGNOSIS — G2581 Restless legs syndrome: Secondary | ICD-10-CM | POA: Diagnosis not present

## 2022-06-19 DIAGNOSIS — K219 Gastro-esophageal reflux disease without esophagitis: Secondary | ICD-10-CM | POA: Diagnosis not present

## 2022-06-19 DIAGNOSIS — J439 Emphysema, unspecified: Secondary | ICD-10-CM | POA: Diagnosis not present

## 2022-06-19 DIAGNOSIS — Z79891 Long term (current) use of opiate analgesic: Secondary | ICD-10-CM | POA: Diagnosis not present

## 2022-06-19 DIAGNOSIS — N179 Acute kidney failure, unspecified: Secondary | ICD-10-CM | POA: Diagnosis not present

## 2022-06-19 DIAGNOSIS — I5033 Acute on chronic diastolic (congestive) heart failure: Secondary | ICD-10-CM | POA: Diagnosis not present

## 2022-06-19 DIAGNOSIS — Z8616 Personal history of COVID-19: Secondary | ICD-10-CM | POA: Diagnosis not present

## 2022-06-19 DIAGNOSIS — Z91041 Radiographic dye allergy status: Secondary | ICD-10-CM | POA: Diagnosis not present

## 2022-06-19 DIAGNOSIS — Z7969 Long term (current) use of other immunomodulators and immunosuppressants: Secondary | ICD-10-CM | POA: Diagnosis not present

## 2022-06-19 DIAGNOSIS — N1832 Chronic kidney disease, stage 3b: Secondary | ICD-10-CM | POA: Diagnosis not present

## 2022-06-19 DIAGNOSIS — Z85528 Personal history of other malignant neoplasm of kidney: Secondary | ICD-10-CM | POA: Diagnosis not present

## 2022-06-19 DIAGNOSIS — J90 Pleural effusion, not elsewhere classified: Secondary | ICD-10-CM | POA: Diagnosis not present

## 2022-06-19 DIAGNOSIS — R300 Dysuria: Secondary | ICD-10-CM | POA: Diagnosis not present

## 2022-06-19 DIAGNOSIS — N390 Urinary tract infection, site not specified: Secondary | ICD-10-CM | POA: Diagnosis not present

## 2022-06-19 DIAGNOSIS — R531 Weakness: Secondary | ICD-10-CM | POA: Diagnosis not present

## 2022-06-19 DIAGNOSIS — Z87891 Personal history of nicotine dependence: Secondary | ICD-10-CM | POA: Diagnosis not present

## 2022-06-19 DIAGNOSIS — E875 Hyperkalemia: Secondary | ICD-10-CM | POA: Diagnosis not present

## 2022-06-19 DIAGNOSIS — M1990 Unspecified osteoarthritis, unspecified site: Secondary | ICD-10-CM | POA: Diagnosis not present

## 2022-06-19 DIAGNOSIS — R9431 Abnormal electrocardiogram [ECG] [EKG]: Secondary | ICD-10-CM | POA: Diagnosis not present

## 2022-06-19 DIAGNOSIS — J189 Pneumonia, unspecified organism: Secondary | ICD-10-CM | POA: Diagnosis not present

## 2022-06-19 DIAGNOSIS — R5383 Other fatigue: Secondary | ICD-10-CM | POA: Diagnosis not present

## 2022-06-19 DIAGNOSIS — Z79899 Other long term (current) drug therapy: Secondary | ICD-10-CM | POA: Diagnosis not present

## 2022-06-19 DIAGNOSIS — I130 Hypertensive heart and chronic kidney disease with heart failure and stage 1 through stage 4 chronic kidney disease, or unspecified chronic kidney disease: Secondary | ICD-10-CM | POA: Diagnosis not present

## 2022-06-19 DIAGNOSIS — E0590 Thyrotoxicosis, unspecified without thyrotoxic crisis or storm: Secondary | ICD-10-CM | POA: Diagnosis not present

## 2022-06-19 DIAGNOSIS — C672 Malignant neoplasm of lateral wall of bladder: Secondary | ICD-10-CM | POA: Diagnosis not present

## 2022-06-19 DIAGNOSIS — M810 Age-related osteoporosis without current pathological fracture: Secondary | ICD-10-CM | POA: Diagnosis not present

## 2022-06-19 DIAGNOSIS — B952 Enterococcus as the cause of diseases classified elsewhere: Secondary | ICD-10-CM | POA: Diagnosis not present

## 2022-06-19 DIAGNOSIS — J441 Chronic obstructive pulmonary disease with (acute) exacerbation: Secondary | ICD-10-CM | POA: Diagnosis not present

## 2022-06-19 DIAGNOSIS — I3139 Other pericardial effusion (noninflammatory): Secondary | ICD-10-CM | POA: Diagnosis not present

## 2022-06-19 DIAGNOSIS — M109 Gout, unspecified: Secondary | ICD-10-CM | POA: Diagnosis not present

## 2022-06-19 DIAGNOSIS — J9811 Atelectasis: Secondary | ICD-10-CM | POA: Diagnosis not present

## 2022-06-19 DIAGNOSIS — Z905 Acquired absence of kidney: Secondary | ICD-10-CM | POA: Diagnosis not present

## 2022-06-19 NOTE — Telephone Encounter (Signed)
Per Vaughan Basta, patient's daughter, Ms. Schoenberger is still very weak.  Vaughan Basta thought after receiving blood transfusion last Thursday that she would have been feeling better then she is.  Ms. Greggs has an appt tomorrow 06/20/2022 for Labs and to see Dr. Federico Flake.  I stressed to her daughter that Patient should keep this appt so we can access her.  Vaughan Basta acknowledged this and states that her brother doesn't work tomorrow and should be able to bring her.

## 2022-06-20 ENCOUNTER — Other Ambulatory Visit: Payer: Medicare Other

## 2022-06-20 ENCOUNTER — Ambulatory Visit: Payer: Medicare Other | Admitting: Oncology

## 2022-06-20 DIAGNOSIS — I3139 Other pericardial effusion (noninflammatory): Secondary | ICD-10-CM

## 2022-06-22 ENCOUNTER — Ambulatory Visit: Payer: Medicare Other

## 2022-06-22 ENCOUNTER — Encounter: Payer: Self-pay | Admitting: *Deleted

## 2022-06-22 ENCOUNTER — Telehealth: Payer: Self-pay | Admitting: *Deleted

## 2022-06-22 NOTE — Patient Outreach (Signed)
  Care Coordination [facility] Note Transition Care Management Unsuccessful Follow-up Telephone Call  Date of discharge and from where:  Thursday, June 21, 2022, [facility]; unknown reason; hospitalization 1/9-11/24 verified by K[facility] review  Attempts:  1st Attempt  Reason for unsuccessful TCM follow-up call:  Voice mail full  Received automated outgoing voice message stating, "the voice mail box is full and cannot accept any messages at this time goodbye;" unable to leave voice message requesting call back   Oneta Rack, RN, BSN, CCRN Alumnus RN CM Care Coordination/ Transition of [place] Management [phone]: direct office

## 2022-06-24 DIAGNOSIS — Z85528 Personal history of other malignant neoplasm of kidney: Secondary | ICD-10-CM | POA: Diagnosis not present

## 2022-06-24 DIAGNOSIS — N179 Acute kidney failure, unspecified: Secondary | ICD-10-CM | POA: Diagnosis not present

## 2022-06-24 DIAGNOSIS — N185 Chronic kidney disease, stage 5: Secondary | ICD-10-CM | POA: Diagnosis not present

## 2022-06-24 DIAGNOSIS — R0902 Hypoxemia: Secondary | ICD-10-CM | POA: Diagnosis not present

## 2022-06-24 DIAGNOSIS — K5900 Constipation, unspecified: Secondary | ICD-10-CM | POA: Diagnosis not present

## 2022-06-24 DIAGNOSIS — E875 Hyperkalemia: Secondary | ICD-10-CM | POA: Diagnosis not present

## 2022-06-24 DIAGNOSIS — J90 Pleural effusion, not elsewhere classified: Secondary | ICD-10-CM | POA: Diagnosis not present

## 2022-06-24 DIAGNOSIS — R531 Weakness: Secondary | ICD-10-CM | POA: Diagnosis not present

## 2022-06-24 DIAGNOSIS — R7401 Elevation of levels of liver transaminase levels: Secondary | ICD-10-CM | POA: Diagnosis not present

## 2022-06-24 DIAGNOSIS — J9601 Acute respiratory failure with hypoxia: Secondary | ICD-10-CM | POA: Diagnosis not present

## 2022-06-24 DIAGNOSIS — Z87891 Personal history of nicotine dependence: Secondary | ICD-10-CM | POA: Diagnosis not present

## 2022-06-24 DIAGNOSIS — E039 Hypothyroidism, unspecified: Secondary | ICD-10-CM | POA: Diagnosis not present

## 2022-06-24 DIAGNOSIS — I5033 Acute on chronic diastolic (congestive) heart failure: Secondary | ICD-10-CM | POA: Diagnosis not present

## 2022-06-24 DIAGNOSIS — E7800 Pure hypercholesterolemia, unspecified: Secondary | ICD-10-CM | POA: Diagnosis not present

## 2022-06-24 DIAGNOSIS — M109 Gout, unspecified: Secondary | ICD-10-CM | POA: Diagnosis not present

## 2022-06-24 DIAGNOSIS — I5031 Acute diastolic (congestive) heart failure: Secondary | ICD-10-CM | POA: Diagnosis not present

## 2022-06-24 DIAGNOSIS — J441 Chronic obstructive pulmonary disease with (acute) exacerbation: Secondary | ICD-10-CM | POA: Diagnosis not present

## 2022-06-24 DIAGNOSIS — R000 Tachycardia, unspecified: Secondary | ICD-10-CM | POA: Diagnosis not present

## 2022-06-24 DIAGNOSIS — N390 Urinary tract infection, site not specified: Secondary | ICD-10-CM | POA: Diagnosis not present

## 2022-06-24 DIAGNOSIS — C672 Malignant neoplasm of lateral wall of bladder: Secondary | ICD-10-CM | POA: Diagnosis not present

## 2022-06-24 DIAGNOSIS — R9431 Abnormal electrocardiogram [ECG] [EKG]: Secondary | ICD-10-CM | POA: Diagnosis not present

## 2022-06-24 DIAGNOSIS — I132 Hypertensive heart and chronic kidney disease with heart failure and with stage 5 chronic kidney disease, or end stage renal disease: Secondary | ICD-10-CM | POA: Diagnosis not present

## 2022-06-24 DIAGNOSIS — D72829 Elevated white blood cell count, unspecified: Secondary | ICD-10-CM | POA: Diagnosis not present

## 2022-06-24 DIAGNOSIS — Z923 Personal history of irradiation: Secondary | ICD-10-CM | POA: Diagnosis not present

## 2022-06-24 DIAGNOSIS — E8729 Other acidosis: Secondary | ICD-10-CM | POA: Diagnosis not present

## 2022-06-24 DIAGNOSIS — R0602 Shortness of breath: Secondary | ICD-10-CM | POA: Diagnosis not present

## 2022-06-24 DIAGNOSIS — M1990 Unspecified osteoarthritis, unspecified site: Secondary | ICD-10-CM | POA: Diagnosis not present

## 2022-06-24 DIAGNOSIS — Z905 Acquired absence of kidney: Secondary | ICD-10-CM | POA: Diagnosis not present

## 2022-06-24 DIAGNOSIS — I700 Atherosclerosis of aorta: Secondary | ICD-10-CM | POA: Diagnosis not present

## 2022-06-24 DIAGNOSIS — R0989 Other specified symptoms and signs involving the circulatory and respiratory systems: Secondary | ICD-10-CM | POA: Diagnosis not present

## 2022-06-24 DIAGNOSIS — D631 Anemia in chronic kidney disease: Secondary | ICD-10-CM | POA: Diagnosis not present

## 2022-06-25 ENCOUNTER — Encounter: Payer: Self-pay | Admitting: *Deleted

## 2022-06-25 ENCOUNTER — Telehealth: Payer: Self-pay | Admitting: *Deleted

## 2022-06-25 NOTE — Patient Outreach (Signed)
  Care Coordination [facility] Note Transition Care Management Follow-up Telephone Call Date of discharge and from where: Thursday, 06/21/22, [facility], unknown reason; hospitalization verified from 1/9-11/24 per KPN and husband's report today How have you been since you were released from the hospital? Per husband:  "She is already back in the hospital and is there at [facility] now.... we had to take her back, she was just still sick from all that chemo- she had a lot of fluid build up.  I am staying in touch with the doctors at the hospital" Any questions or concerns? Yes- per husband, patient experienced hospital re-admission after her 06/21/22 discharge  Items Reviewed: Did the pt receive and understand the discharge instructions provided?  N/A Medications obtained and verified?  N/A- patient has re-admitted to hospital Other? No  Any new allergies since your discharge?  N/A Dietary orders reviewed? No Do you have support at home?  Husband reports he assists patient as needed with care needs  Home Care and Equipment/Supplies: Were home health services ordered? not applicable If so, what is the name of the agency? N/A  Has the agency set up a time to come to the patient's home? not applicable Were any new equipment or medical supplies ordered?  No What is the name of the medical supply agency? N/A Were you able to get the supplies/equipment? not applicable Do you have any questions related to the use of the equipment or supplies? No N/A  Functional Questionnaire: (I = Independent and D = Dependent) ADLs: N/A  Bathing/Dressing- N/A  Meal Prep- N/A  Eating- N/A  Maintaining continence- N/A  Transferring/Ambulation- N/A  Managing Meds- N/A  Follow up appointments reviewed:  PCP Hospital f/u appt confirmed?  N/A-- husband reports patient has re-admitted to hospital  Scheduled to see - on - @ [facility] f/u appt confirmed?  N/A  Scheduled to see - on - @ - Are  transportation arrangements needed?  unknown If their condition worsens, is the pt aware to call PCP or go to the Emergency Dept.? Yes Was the patient provided with contact information for the PCP's office or ED? No- husband reports already has phone number Was to pt encouraged to call back with questions or concerns? Yes  SDOH assessments and interventions completed:   No  per husband, patient experienced hospital re-admission after her 06/21/22 discharge and remains hospitalized at [facility] at time of today's [facility] call  Care Coordination Interventions:  Coached patient's spouse on need to remain in close contact with hospital team and make sure patient has all care needs addressed at time of next discharge, including need for hospital follow up appointment if patient discharges back home after current hospitalization     Encounter Outcome:  Pt. Visit Completed    Oneta Rack, RN, BSN, CCRN Alumnus RN CM Care Coordination/ Transition of [place] Management [phone]: direct office

## 2022-06-25 NOTE — Patient Outreach (Signed)
  Care Coordination   Multidisciplinary Case Review Note    06/25/2022 Name: IZABELA OW MRN: [id number] DOB: 05-Feb-1941  KALANDRA MASTERS is a 82 y.o. year old female who sees [facility], Elnita Maxwell, MD for primary care.  The multidisciplinary care team met today to review patient care needs and barriers.    SDOH assessments and interventions completed:  No Not indicated during today's TOC call- patient's husband reported patient had experienced hospital re-admission over weekend and is currently hospitalized   Care Coordination Interventions Activated:  Yes   Care Coordination Interventions:  Yes, provided multidisciplinary case conference completed today   Follow up plan: No further intervention required.   Multidisciplinary Team Attendees:   Reginia Naas, RN CM [facility] Care Coordinator; Tomasa Rand, RN CM Care Coordinator; Eduard Clos, LCSW  Scribe for Multidisciplinary Case Review:  N/A  Oneta Rack, RN, BSN, CCRN Alumnus RN CM Care Coordination/ Transition of [place] Management [phone]: direct office

## 2022-06-26 ENCOUNTER — Encounter: Payer: Self-pay | Admitting: Oncology

## 2022-06-26 ENCOUNTER — Ambulatory Visit: Payer: Medicare Other

## 2022-06-27 DIAGNOSIS — R000 Tachycardia, unspecified: Secondary | ICD-10-CM | POA: Diagnosis not present

## 2022-07-02 ENCOUNTER — Other Ambulatory Visit: Payer: Self-pay | Admitting: Nurse Practitioner

## 2022-07-02 ENCOUNTER — Ambulatory Visit: Payer: Self-pay | Admitting: *Deleted

## 2022-07-02 ENCOUNTER — Encounter: Payer: Self-pay | Admitting: *Deleted

## 2022-07-02 ENCOUNTER — Telehealth: Payer: Self-pay | Admitting: *Deleted

## 2022-07-02 ENCOUNTER — Telehealth: Payer: Self-pay

## 2022-07-02 DIAGNOSIS — Z9981 Dependence on supplemental oxygen: Secondary | ICD-10-CM | POA: Diagnosis not present

## 2022-07-02 DIAGNOSIS — C672 Malignant neoplasm of lateral wall of bladder: Secondary | ICD-10-CM | POA: Diagnosis not present

## 2022-07-02 DIAGNOSIS — N179 Acute kidney failure, unspecified: Secondary | ICD-10-CM | POA: Diagnosis not present

## 2022-07-02 DIAGNOSIS — I10 Essential (primary) hypertension: Secondary | ICD-10-CM

## 2022-07-02 DIAGNOSIS — I5033 Acute on chronic diastolic (congestive) heart failure: Secondary | ICD-10-CM | POA: Diagnosis not present

## 2022-07-02 DIAGNOSIS — M109 Gout, unspecified: Secondary | ICD-10-CM | POA: Diagnosis not present

## 2022-07-02 DIAGNOSIS — M810 Age-related osteoporosis without current pathological fracture: Secondary | ICD-10-CM | POA: Diagnosis not present

## 2022-07-02 DIAGNOSIS — N185 Chronic kidney disease, stage 5: Secondary | ICD-10-CM | POA: Diagnosis not present

## 2022-07-02 DIAGNOSIS — G4700 Insomnia, unspecified: Secondary | ICD-10-CM | POA: Diagnosis not present

## 2022-07-02 DIAGNOSIS — D631 Anemia in chronic kidney disease: Secondary | ICD-10-CM | POA: Diagnosis not present

## 2022-07-02 DIAGNOSIS — Z95828 Presence of other vascular implants and grafts: Secondary | ICD-10-CM | POA: Diagnosis not present

## 2022-07-02 DIAGNOSIS — B952 Enterococcus as the cause of diseases classified elsewhere: Secondary | ICD-10-CM | POA: Diagnosis not present

## 2022-07-02 DIAGNOSIS — E213 Hyperparathyroidism, unspecified: Secondary | ICD-10-CM | POA: Diagnosis not present

## 2022-07-02 DIAGNOSIS — N390 Urinary tract infection, site not specified: Secondary | ICD-10-CM | POA: Diagnosis not present

## 2022-07-02 DIAGNOSIS — I132 Hypertensive heart and chronic kidney disease with heart failure and with stage 5 chronic kidney disease, or end stage renal disease: Secondary | ICD-10-CM | POA: Diagnosis not present

## 2022-07-02 DIAGNOSIS — Z87891 Personal history of nicotine dependence: Secondary | ICD-10-CM | POA: Diagnosis not present

## 2022-07-02 DIAGNOSIS — G2581 Restless legs syndrome: Secondary | ICD-10-CM | POA: Diagnosis not present

## 2022-07-02 DIAGNOSIS — E039 Hypothyroidism, unspecified: Secondary | ICD-10-CM | POA: Diagnosis not present

## 2022-07-02 DIAGNOSIS — Z905 Acquired absence of kidney: Secondary | ICD-10-CM | POA: Diagnosis not present

## 2022-07-02 DIAGNOSIS — M1990 Unspecified osteoarthritis, unspecified site: Secondary | ICD-10-CM | POA: Diagnosis not present

## 2022-07-02 DIAGNOSIS — K219 Gastro-esophageal reflux disease without esophagitis: Secondary | ICD-10-CM | POA: Diagnosis not present

## 2022-07-02 DIAGNOSIS — E782 Mixed hyperlipidemia: Secondary | ICD-10-CM | POA: Diagnosis not present

## 2022-07-02 DIAGNOSIS — Z7952 Long term (current) use of systemic steroids: Secondary | ICD-10-CM | POA: Diagnosis not present

## 2022-07-02 DIAGNOSIS — Z85528 Personal history of other malignant neoplasm of kidney: Secondary | ICD-10-CM | POA: Diagnosis not present

## 2022-07-02 DIAGNOSIS — J4489 Other specified chronic obstructive pulmonary disease: Secondary | ICD-10-CM | POA: Diagnosis not present

## 2022-07-02 DIAGNOSIS — E872 Acidosis, unspecified: Secondary | ICD-10-CM | POA: Diagnosis not present

## 2022-07-02 NOTE — Patient Outreach (Signed)
  Care Coordination   Multidisciplinary Case Review Note    07/02/2022 Name: TERIN DIEROLF MRN: [id number] DOB: [birth year]  DIVINE IMBER is a 82 y.o. year old female who sees [facility], Elnita Maxwell, MD for primary care.  The multidisciplinary care team met today to review patient care needs and barriers.    SDOH assessments and interventions completed:  Yes Completed during TOC call placed earlier today   Care Coordination Interventions Activated:  Yes   Care Coordination Interventions:  Yes, provided   Follow up plan: Follow up call scheduled for 07/09/22 with RN CM Care Coordinator    Multidisciplinary Team Attendees:   Reginia Naas, RN CM/ TOC; Tomasa Rand, RN CM Care Coordinator; Eduard Clos, LCSW  Scribe for Multidisciplinary Case Review:  N/A  Oneta Rack, RN, BSN, CCRN Alumnus RN CM Care Coordination/ Transition of [place] Management [phone]: direct office

## 2022-07-02 NOTE — Telephone Encounter (Signed)
Verbal Order given Cardio-plum 2 x a week for 3 weeks and 1 x a week for 6 weeks.

## 2022-07-02 NOTE — Patient Outreach (Signed)
Care Coordination [facility] Note Transition Care Management Follow-up Telephone Call Date of discharge and from where: Friday, 06/29/22, [facility]; hospitalization verified per [facility] review; per patient report: "I had to be in the hospital back-back because I couldn't breathe and my oxygen was low" How have you been since you were released from the hospital? "So far so good; I am doing okay this time and have had a good couple of days, no problems so far.  The home health nurse is coming here in just a few minutes and I am going to have her look at all my medicines, so I can't review them all with you right now.  My son fixes my medicines and puts them in a pill box-- I think there is still one medication left for [place] to pick up at the pharmacy, but I am not sure-- I am going to go over all of this with the nurse when she gets here." Any questions or concerns? No  Items Reviewed: Did the pt receive and understand the discharge instructions provided? Yes  Medications obtained and verified?  Unable to determine-- patient declines medication review; today she denies medication concerns/ questions- states she is going to have hojme health nurse review when she arrives in "just a few minutes"  Other? No  Any new allergies since your discharge? No  Dietary orders reviewed? Yes Do you have support at home? Yes  family provides assistance as/ if needed; reports she is essentially independent in self-care activities  Home Care and Equipment/Supplies: Were home health services ordered? yes If so, what is the name of the agency? [place] set up a time to come to the patient's home? yes Were any new equipment or medical supplies ordered?  No What is the name of the medical supply agency? N/A Were you able to get the supplies/equipment? not applicable Do you have any questions related to the use of the equipment or supplies? No  Functional Questionnaire: (I = Independent and D =  Dependent) ADLs: I  Bathing/Dressing- I  Meal Prep- I  Eating- I  Maintaining continence- I  Transferring/Ambulation- I  Managing Meds- I reports son prepares medications in pill box and then she takes independently once medicine in pill box  Follow up appointments reviewed:  PCP Hospital f/u appt confirmed? Yes  Scheduled to see PCP provider on Tuesday 07/03/22 @ 2:00 pm Specialist Hospital f/u appt confirmed? No  Scheduled to see - on - @ - Are transportation arrangements needed? No  If their condition worsens, is the pt aware to call PCP or go to the Emergency Dept.? Yes Was the patient provided with contact information for the PCP's office or ED? No- patient declined; reports already has contact information for all care providers Was to pt encouraged to call back with questions or concerns? Yes  SDOH assessments and interventions completed:   Yes SDOH Interventions Today    Flowsheet Row Most Recent Value  SDOH Interventions   Food Insecurity Interventions Intervention Not Indicated  Transportation Interventions Intervention Not Indicated  [reports friends and/ or family members provide transportation]      Care Coordination Interventions:  Referred for Care Coordination Services:  RN Care Coordinator Provided education around importance of ensuring she has medications post-recent hospital discharges; importance of ensuring she attends post-hospital follow up visit with PCP; care coordination outreach to scheduled RN CM Care Coordinator as Juluis Rainier    Encounter Outcome:  Pt. Visit Completed  Oneta Rack, RN, BSN, CCRN Alumnus RN CM Care Coordination/ Transition of [place] Management [phone]: direct office

## 2022-07-03 ENCOUNTER — Encounter: Payer: Self-pay | Admitting: Nurse Practitioner

## 2022-07-03 ENCOUNTER — Telehealth: Payer: Self-pay

## 2022-07-03 ENCOUNTER — Ambulatory Visit (INDEPENDENT_AMBULATORY_CARE_PROVIDER_SITE_OTHER): Payer: Medicare Other | Admitting: Nurse Practitioner

## 2022-07-03 VITALS — BP 142/86 | HR 97 | Temp 97.0°F | Ht 62.0 in | Wt 166.0 lb

## 2022-07-03 DIAGNOSIS — I5033 Acute on chronic diastolic (congestive) heart failure: Secondary | ICD-10-CM

## 2022-07-03 DIAGNOSIS — I10 Essential (primary) hypertension: Secondary | ICD-10-CM | POA: Diagnosis not present

## 2022-07-03 DIAGNOSIS — I130 Hypertensive heart and chronic kidney disease with heart failure and stage 1 through stage 4 chronic kidney disease, or unspecified chronic kidney disease: Secondary | ICD-10-CM

## 2022-07-03 DIAGNOSIS — N185 Chronic kidney disease, stage 5: Secondary | ICD-10-CM | POA: Diagnosis not present

## 2022-07-03 DIAGNOSIS — N179 Acute kidney failure, unspecified: Secondary | ICD-10-CM | POA: Diagnosis not present

## 2022-07-03 DIAGNOSIS — R600 Localized edema: Secondary | ICD-10-CM

## 2022-07-03 DIAGNOSIS — N1832 Chronic kidney disease, stage 3b: Secondary | ICD-10-CM

## 2022-07-03 DIAGNOSIS — C672 Malignant neoplasm of lateral wall of bladder: Secondary | ICD-10-CM | POA: Diagnosis not present

## 2022-07-03 DIAGNOSIS — N184 Chronic kidney disease, stage 4 (severe): Secondary | ICD-10-CM | POA: Diagnosis not present

## 2022-07-03 DIAGNOSIS — I129 Hypertensive chronic kidney disease with stage 1 through stage 4 chronic kidney disease, or unspecified chronic kidney disease: Secondary | ICD-10-CM | POA: Diagnosis not present

## 2022-07-03 DIAGNOSIS — J449 Chronic obstructive pulmonary disease, unspecified: Secondary | ICD-10-CM | POA: Diagnosis not present

## 2022-07-03 DIAGNOSIS — D631 Anemia in chronic kidney disease: Secondary | ICD-10-CM | POA: Diagnosis not present

## 2022-07-03 DIAGNOSIS — I132 Hypertensive heart and chronic kidney disease with heart failure and with stage 5 chronic kidney disease, or end stage renal disease: Secondary | ICD-10-CM | POA: Diagnosis not present

## 2022-07-03 NOTE — Assessment & Plan Note (Signed)
Continue taking  Entresto 24-26 2 times daily, Torsemide 20 mg daily  and Metoprolol 50 mg daily  Referral to cardiology, [place], Dr Bettina Gavia B.

## 2022-07-03 NOTE — Assessment & Plan Note (Signed)
Taking torsemide 20 mg OD Suggested to elevate the legs all the time And using compression shocks.

## 2022-07-03 NOTE — Assessment & Plan Note (Addendum)
Taking amlodipine 10 mg daily, Entresto 24-26 2 times daily, Torsemide 20 mg daily  and Metoprolol 50 mg daily   Nutrition: Stressed importance of moderation in sodium intake, saturated fat and cholesterol, caloric balance, sufficient intake of complex carbohydrates, fiber, calcium and iron.   Exercise: Stressed the importance of regular exercise.

## 2022-07-03 NOTE — Assessment & Plan Note (Signed)
On  Continuous Oxygen '@2l'$ /min post hospitalization Oxygen saturation between 94-96 Breztri inhaler QD and Albuterol PRN

## 2022-07-03 NOTE — Telephone Encounter (Signed)
[facility] called stating they need orders for patient to have PT 2 times for 3 weeks and then once a week for 6 weeks.  They will be working on balance, and reaction and strength.  [phone]

## 2022-07-03 NOTE — Assessment & Plan Note (Signed)
Following nephrologist to [facility]

## 2022-07-03 NOTE — Patient Instructions (Signed)
Follow up with [facility] as soon as possible Will call you for the referral to the cardiologist from here Try to wean off the oxygen make sure your saturation is all the time above 90  Chronic Obstructive Pulmonary Disease Exacerbation     DASH Eating Plan DASH stands for Dietary Approaches to Stop Hypertension. The DASH eating plan is a healthy eating plan that has been shown to: Reduce high blood pressure (hypertension). Reduce your risk for type 2 diabetes, heart disease, and stroke. Help with weight loss. What are tips for following this plan? Reading food labels Check food labels for the amount of salt (sodium) per serving. Choose foods with less than 5 percent of the Daily Value of sodium. Generally, foods with less than 300 milligrams (mg) of sodium per serving fit into this eating plan. To find whole grains, look for the word "whole" as the first word in the ingredient list. Shopping Buy products labeled as "low-sodium" or "no salt added." Buy fresh foods. Avoid canned foods and pre-made or frozen meals. Cooking Avoid adding salt when cooking. Use salt-free seasonings or herbs instead of table salt or sea salt. Check with your health care provider or pharmacist before using salt substitutes. Do not fry foods. Cook foods using healthy methods such as baking, boiling, grilling, roasting, and broiling instead. Cook with heart-healthy oils, such as olive, canola, avocado, soybean, or sunflower oil. Meal planning  Eat a balanced diet that includes: 4 or more servings of fruits and 4 or more servings of vegetables each day. Try to fill one-half of your plate with fruits and vegetables. 6-8 servings of whole grains each day. Less than 6 oz (170 g) of lean meat, poultry, or fish each day. A 3-oz (85-g) serving of meat is about the same size as a deck of cards. One egg equals 1 oz (28 g). 2-3 servings of low-fat dairy each day. One serving is 1 cup (237 mL). 1 serving of nuts,  seeds, or beans 5 times each week. 2-3 servings of heart-healthy fats. Healthy fats called omega-3 fatty acids are found in foods such as walnuts, flaxseeds, fortified milks, and eggs. These fats are also found in cold-water fish, such as sardines, salmon, and mackerel. Limit how much you eat of: Canned or prepackaged foods. Food that is high in trans fat, such as some fried foods. Food that is high in saturated fat, such as fatty meat. Desserts and other sweets, sugary drinks, and other foods with added sugar. Full-fat dairy products. Do not salt foods before eating. Do not eat more than 4 egg yolks a week. Try to eat at least 2 vegetarian meals a week. Eat more home-cooked food and less restaurant, buffet, and fast food. Lifestyle When eating at a restaurant, ask that your food be prepared with less salt or no salt, if possible. If you drink alcohol: Limit how much you use to: 0-1 drink a day for women who are not pregnant. 0-2 drinks a day for men. Be aware of how much alcohol is in your drink. In the [place] one drink equals one 12 oz bottle of beer (355 mL), one 5 oz glass of wine (148 mL), or one 1 oz glass of hard liquor (44 mL). General information Avoid eating more than 2,300 mg of salt a day. If you have hypertension, you may need to reduce your sodium intake to 1,500 mg a day. Work with your health care provider to maintain a healthy body weight or to lose  weight. Ask what an ideal weight is for you. Get at least 30 minutes of exercise that causes your heart to beat faster (aerobic exercise) most days of the week. Activities may include walking, swimming, or biking. Work with your health care provider or dietitian to adjust your eating plan to your individual calorie needs. What foods should I eat? Fruits All fresh, dried, or frozen fruit. Canned fruit in natural juice (without added sugar). Vegetables Fresh or frozen vegetables (raw, steamed, roasted, or grilled). Low-sodium or  reduced-sodium tomato and vegetable juice. Low-sodium or reduced-sodium tomato sauce and tomato paste. Low-sodium or reduced-sodium canned vegetables. Grains Whole-grain or whole-wheat bread. Whole-grain or whole-wheat pasta. Brown rice. [place]. Bulgur. Whole-grain and low-sodium cereals. Pita bread. Low-fat, low-sodium crackers. Whole-wheat flour tortillas. Meats and other proteins Skinless chicken or [place]. Ground chicken or [place]. Pork with fat trimmed off. Fish and seafood. Egg whites. Dried beans, peas, or lentils. Unsalted nuts, nut butters, and seeds. Unsalted canned beans. Lean cuts of beef with fat trimmed off. Low-sodium, lean precooked or cured meat, such as sausages or meat loaves. Dairy Low-fat (1%) or fat-free (skim) milk. Reduced-fat, low-fat, or fat-free cheeses. Nonfat, low-sodium ricotta or cottage cheese. Low-fat or nonfat yogurt. Low-fat, low-sodium cheese. Fats and oils Soft margarine without trans fats. Vegetable oil. Reduced-fat, low-fat, or light mayonnaise and salad dressings (reduced-sodium). Canola, safflower, olive, avocado, soybean, and sunflower oils. Avocado. Seasonings and condiments Herbs. Spices. Seasoning mixes without salt. Other foods Unsalted popcorn and pretzels. Fat-free sweets. The items listed above may not be a complete list of foods and beverages you can eat. Contact a dietitian for more information. What foods should I avoid? Fruits Canned fruit in a light or heavy syrup. Fried fruit. Fruit in cream or butter sauce. Vegetables Creamed or fried vegetables. Vegetables in a cheese sauce. Regular canned vegetables (not low-sodium or reduced-sodium). Regular canned tomato sauce and paste (not low-sodium or reduced-sodium). Regular tomato and vegetable juice (not low-sodium or reduced-sodium). Angie Fava. Olives. Grains Baked goods made with fat, such as croissants, muffins, or some breads. Dry pasta or rice meal packs. Meats and other  proteins Fatty cuts of meat. Ribs. Fried meat. Berniece Salines. Bologna, salami, and other precooked or cured meats, such as sausages or meat loaves. Fat from the back of a pig (fatback). Bratwurst. Salted nuts and seeds. Canned beans with added salt. Canned or smoked fish. Whole eggs or egg yolks. Chicken or [place] with skin. Dairy Whole or 2% milk, cream, and half-and-half. Whole or full-fat cream cheese. Whole-fat or sweetened yogurt. Full-fat cheese. Nondairy creamers. Whipped toppings. Processed cheese and cheese spreads. Fats and oils Butter. Stick margarine. Lard. Shortening. Ghee. Bacon fat. Tropical oils, such as coconut, palm kernel, or palm oil. Seasonings and condiments Onion salt, garlic salt, seasoned salt, table salt, and sea salt. Worcestershire sauce. Tartar sauce. Barbecue sauce. Teriyaki sauce. Soy sauce, including reduced-sodium. Steak sauce. Canned and packaged gravies. Fish sauce. Oyster sauce. Cocktail sauce. Store-bought horseradish. Ketchup. Mustard. Meat flavorings and tenderizers. Bouillon cubes. Hot sauces. Pre-made or packaged marinades. Pre-made or packaged taco seasonings. Relishes. Regular salad dressings. Other foods Salted popcorn and pretzels. The items listed above may not be a complete list of foods and beverages you should avoid. Contact a dietitian for more information. Where to find more information National Heart, Lung, and Blood Institute: https://wilson-eaton.com/ American Heart Association: www.heart.org Academy of Nutrition and Dietetics: www.eatright.[place]: www.kidney.org Summary The DASH eating plan is a healthy eating plan that has been shown  to reduce high blood pressure (hypertension). It may also reduce your risk for type 2 diabetes, heart disease, and stroke. When on the DASH eating plan, aim to eat more fresh fruits and vegetables, whole grains, lean proteins, low-fat dairy, and heart-healthy fats. With the DASH eating plan, you should  limit salt (sodium) intake to 2,300 mg a day. If you have hypertension, you may need to reduce your sodium intake to 1,500 mg a day. Work with your health care provider or dietitian to adjust your eating plan to your individual calorie needs. This information is not intended to replace advice given to you by your health care provider. Make sure you discuss any questions you have with your health care provider. Document Revised: 05/01/2019 Document Reviewed: 05/01/2019 Elsevier Patient Education  [place].  Chronic obstructive pulmonary disease (COPD) is a long-term (chronic) condition that affects the lungs. COPD is a general term that can be used to describe many different lung problems that cause lung inflammation and limit airflow, including chronic bronchitis and emphysema. COPD exacerbations are episodes when breathing symptoms flare up, become much worse, and require extra treatment. COPD exacerbations are usually caused by infections. Without treatment, COPD exacerbations can be severe and even life threatening. Frequent COPD exacerbations can cause further damage to the lungs. What are the causes? This condition may be caused by: Respiratory infections, including viral and bacterial infections. Exposure to smoke. Exposure to air pollution, chemical fumes, or dust. Things that can cause an allergic reaction (allergens). Not taking your usual COPD medicines as directed. Underlying medical problems, such as congestive heart failure or infections not involving the lungs. In many cases, the cause of this condition is not known. What increases the risk? The following factors may make you more likely to develop this condition: Smoking cigarettes. Being an older adult. Having frequent prior COPD exacerbations. What are the signs or symptoms? Symptoms of this condition include: Increased coughing. Increased production of mucus from your lungs. Increased wheezing and shortness of  breath. Rapid or labored breathing. Chest tightness. Less energy than usual. Sleep disruption from symptoms. Confusion Increased sleepiness. Often, these symptoms happen or get worse even with the use of medicines. How is this diagnosed? This condition is diagnosed based on: Your medical history. A physical exam. You may also have tests, including: A chest X-ray. Blood tests. Lung (pulmonary) function tests. How is this treated? Treatment for this condition depends on the severity and cause of the symptoms. You may need to be admitted to a hospital for treatment. Some of the treatments commonly used to treat COPD exacerbations are: Antibiotic medicines. These may be used for severe exacerbations caused by a lung infection, such as pneumonia. Bronchodilators. These are inhaled medicines that expand the air passages and allow increased airflow. They may make your breathing more comfortable. Steroid medicines. These act to reduce inflammation in the airways. They may be given with an inhaler, taken by mouth, or given through an IV tube inserted into one of your veins. Supplemental oxygen therapy. Airway clearing techniques, such as noninvasive ventilation (NIV) and positive expiratory pressure (PEP). These provide respiratory support through a mask or other noninvasive device. An example of this would be using a continuous positive airway pressure (CPAP) machine to improve delivery of oxygen into your lungs. Follow these instructions at home: Medicines Take over-the-counter and prescription medicines only as told by your health care provider. It is important to use correct technique with inhaled medicines. If you were prescribed an antibiotic  medicine or oral steroid, take it as told by your health care provider. Do not stop taking the medicine even if you start to feel better. Lifestyle Do not use any products that contain nicotine or tobacco. These products include cigarettes, chewing  tobacco, and vaping devices, such as e-cigarettes. If you need help quitting, ask your health care provider. Eat a healthy diet. Exercise regularly. Get enough sleep. Most adults need 7 or more hours per night. Avoid exposure to all substances that irritate the airway, especially tobacco smoke. Regularly wash your hands with soap and water for at least 20 seconds. If soap and water are not available, use hand sanitizer. This may help prevent you from getting infections. During flu season, avoid enclosed spaces that are crowded with people. General instructions Drink enough fluid to keep your urine pale yellow, unless you have a medical condition that requires fluid restriction. Use a cool mist vaporizer. This humidifies the air and makes it easier for you to clear your chest when you cough. If you have a home nebulizer and oxygen, continue to use them as told by your health care provider. Keep all follow-up visits. This is important. How is this prevented? Stay up-to-date on pneumococcal and flu (influenza) vaccines. A flu shot is recommended every year to help prevent exacerbations. Quitting smoking is very important in preventing COPD from getting worse and in preventing exacerbations from happening as often. Follow all instructions for pulmonary rehabilitation after a recent exacerbation. This can help prevent future exacerbations. Work with your health care provider to develop and follow an action plan. This tells you what steps to take when you experience certain symptoms. Contact a health care provider if: You have a worsening of your regular COPD symptoms. Get help right away if: You have worsening shortness of breath, even when resting. You have trouble talking. You have severe chest pain. You cough up blood. You have a fever. You have weakness, vomit repeatedly, or faint. You feel confused. You are not able to sleep because of your symptoms. You have trouble doing daily  activities. These symptoms may represent a serious problem that is an emergency. Do not wait to see if the symptoms will go away. Get medical help right away. Call your local emergency services (911 in the [place] Do not drive yourself to the hospital. Summary COPD exacerbations are episodes when breathing symptoms become much worse and require extra treatment above your normal treatment. Exacerbations can be severe and even life threatening. Frequent COPD exacerbations can cause further damage to your lungs. COPD exacerbations are usually triggered by infections such as the flu, colds, and even pneumonia. Treatment for this condition depends on the severity and cause of the symptoms. You may need to be admitted to a hospital for treatment. Quitting smoking is very important to prevent COPD from getting worse and to prevent exacerbations from happening as often. This information is not intended to replace advice given to you by your health care provider. Make sure you discuss any questions you have with your health care provider. Document Revised: 04/05/2020 Document Reviewed: 04/05/2020 Elsevier Patient Education  [place].

## 2022-07-03 NOTE — Progress Notes (Signed)
Subjective:  Patient ID: Alexandra [name], female    DOB: 03-Mar-1941  Age: 82 y.o. MRN: [id number]  Chief Complaint  Patient presents with   Hospitalization Follow-up    Follow up Hospitalization  Patient was admitted to [facility] on 06/24/2022 and discharged on 06/29/2022 for shortness of breath and worsening bilateral edema. 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 She had total 850 cc of fluids taken off from her lungs during hospitalization in two different days. She was discharged with continuous oxygen 2 l/min. Her chest x-ray stable bilateral pleural effusion  and CT scan shows pericardial effusion, she was treated with IV lasix  and IV Unasyn for UTI, IV solumedrol and nebulizer. Patient was treated for low calcium with IV calcium gluconate and with Lokelma for hyperkalemia.  Pt has a history of COPD. Current treatment includes Breztri inhaler QD and Albuterol PRN. States symptoms are well-controlled post hospitalization. Now she is on 2l oxygen constant and her oxygen saturation runs between 94-96. She is a Former cigarette smoker, quit 2007.  Zyah is currently being treated for bladder carcinoma, followed by Dr Federico Flake, oncologist. She goes to [place] [facility] for her CKD.  She is recently diagnosed with heart failure with preserved EF and being treated with Entresto, Torsemide, and Metoprolol. Cardiology referral sent to [facility].   Patient lives home with her husband, according to her husband also sick and their neighbour friend is helping them to fix the food and keeping up with appointment  Post hospitalization, she has physical therapy and home health RN, so she is actively participation in excercises at this part of her life. She is eating and sleeping well. She does not have any other complaints this time. Denies  SHORTNESS OF BREATH, palpitation and distress.    05/30/2022   10:19 AM 04/10/2022    7:55 AM 02/15/2022    1:31 PM 12/26/2021    8:46 AM 11/23/2021    7:31 AM  Depression screen PHQ 2/9  Decreased Interest 0 0 0 0 0  Down, Depressed, Hopeless 0 0 0 0 0  PHQ - 2 Score 0 0 0 0 0  Altered sleeping 0 0  0   Tired, decreased energy 0 0  0   Change in appetite 0 0  0   Feeling bad or failure about yourself  0 0  0   Trouble concentrating 0 0  0   Moving slowly or fidgety/restless 0 0  0   Suicidal thoughts 0 0  0   PHQ-9 Score 0 0  0   Difficult doing work/chores Not difficult at all Not difficult at all  Not difficult at all          06/06/2022    9:47 AM 06/08/2022    9:06 AM 06/12/2022    9:20 AM 06/13/2022   10:35 AM 06/14/2022    9:53 AM  Fall Risk  (RETIRED) Patient Fall Risk Level Low fall risk Low fall risk Low fall risk Low fall risk High fall risk      Review of Systems  Constitutional:  Negative for appetite change, fatigue and fever.  HENT:  Negative for congestion, ear pain, sinus pressure and sore throat.   Respiratory:  Positive for shortness of breath. Negative for cough, chest tightness and wheezing.        2 litre oxygen constant  Cardiovascular:  Negative  for chest pain and palpitations.  Gastrointestinal:  Negative for abdominal pain, constipation, diarrhea, nausea and vomiting.  Genitourinary:  Negative for dysuria and hematuria.  Musculoskeletal:  Negative for arthralgias, back pain, joint swelling and myalgias.  Skin:  Negative for rash.  Neurological:  Negative for dizziness, weakness and headaches.  Psychiatric/Behavioral:  Negative for dysphoric mood. The patient is not nervous/anxious.     Current Outpatient Medications on File Prior to Visit  Medication Sig Dispense Refill   amLODipine (NORVASC) 10 MG tablet TAKE 1 TABLET(10 MG) BY MOUTH EVERY MORNING 90 tablet 0   atorvastatin (LIPITOR) 40 MG tablet Take 1 tablet (40 mg total) by mouth daily. Per ccm  90 tablet 3   Budeson-Glycopyrrol-Formoterol (BREZTRI AEROSPHERE) 160-9-4.8 MCG/ACT AERO Inhale 2 puffs into the lungs in the morning and at bedtime. 10.7 g 6   metoprolol succinate (TOPROL-XL) 25 MG 24 hr tablet Take 25 mg by mouth daily.     prochlorperazine (COMPAZINE) 10 MG tablet Take 1 tablet (10 mg total) by mouth every 6 (six) hours as needed for nausea or vomiting. 30 tablet 1   rOPINIRole (REQUIP) 2 MG tablet TAKE 1 TABLET(2 MG) BY MOUTH IN THE MORNING AND AT BEDTIME 180 tablet 6   sacubitril-valsartan (ENTRESTO) 24-26 MG Take 1 tablet by mouth 2 (two) times daily. 60 tablet 6   sodium bicarbonate 650 MG tablet Take 650 mg by mouth 3 (three) times daily.     torsemide (DEMADEX) 20 MG tablet Take 1 tablet (20 mg total) by mouth daily. 30 tablet 3   VENTOLIN HFA 108 (90 Base) MCG/ACT inhaler      No current facility-administered medications on file prior to visit.   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 82 yo   BACK SURGERY  2016   lower L 4 to L 5   bladder tack and uterus lift  10/10/2015   CHOLECYSTECTOMY     summer of 2022   IR RADIOLOGIST EVAL & MGMT  12/03/2018   IR RADIOLOGIST EVAL & MGMT  12/31/2018   PARATHYROIDECTOMY Left 07/02/2016   Procedure: LEFT INFERIOR PARATHYROIDECTOMY;  Surgeon: Armandina Gemma, MD;  Location: [facility];  Service: General;  Laterality: Left;   SHOULDER OPEN ROTATOR CUFF REPAIR  12/2019   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    Family History  Problem Relation Age of Onset   Emphysema Mother    Hyperlipidemia Sister    Hypertension Sister    COPD Sister    Hyperlipidemia Sister     Hypertension Sister    Lymphoma Sister    Obesity Brother    Hyperlipidemia Brother    Hypertension Brother    Breast cancer Neg Hx    Social History   Socioeconomic History   Marital status: Married    Spouse name: Herbie [place]   Number of children: 2   Years of education: 11   Highest education level: 11th grade  Occupational History   Occupation: Retired  Tobacco Use   Smoking status: Former    Packs/day: 1.00    Years: 50.00    Total pack years:  50.00    Types: Cigarettes    Quit date: 06/11/2005    Years since quitting: 17.0   Smokeless tobacco: Never  Vaping Use   Vaping Use: Never used  Substance and Sexual Activity   Alcohol use: No   Drug use: No   Sexual activity: Yes    Partners: Male  Other Topics Concern   Not on file  Social History Narrative   Not on file   Social Determinants of Health   Financial Resource Strain: Medium Risk (05/01/2022)   Overall Financial Resource Strain (CARDIA)    Difficulty of Paying Living Expenses: Somewhat hard  Food Insecurity: No Food Insecurity (07/02/2022)   Hunger Vital Sign    Worried About Running Out of Food in the Last Year: Never true    Ran Out of Food in the Last Year: Never true  Transportation Needs: No Transportation Needs (07/02/2022)   PRAPARE - Hydrologist (Medical): No    Lack of Transportation (Non-Medical): No  Physical Activity: Inactive (02/15/2022)   Exercise Vital Sign    Days of Exercise per Week: 0 days    Minutes of Exercise per Session: 0 min  Stress: No Stress Concern Present (02/15/2022)   [place]    Feeling of Stress : Only a little  Social Connections: Moderately Integrated (02/15/2022)   Social Connection and Isolation Panel [NHANES]    Frequency of Communication with Friends and Family: More than three times a week    Frequency of Social Gatherings with Friends and Family: More than three times a  week    Attends Religious Services: More than 4 times per year    Active Member of [employer] or Organizations: No    Attends Archivist Meetings: Never    Marital Status: Married    Objective:  There were no vitals taken for this visit.     06/14/2022    2:30 PM 06/14/2022    1:08 PM 06/14/2022   12:38 PM  BP/Weight  Systolic BP 814 82 62  Diastolic BP 48 42 51    Physical Exam Vitals reviewed.  Constitutional:      Appearance: Normal appearance. She is normal weight.  Cardiovascular:     Rate and Rhythm: Normal rate and regular rhythm.     Heart sounds: Normal heart sounds.  Pulmonary:     Effort: Pulmonary effort is normal. No respiratory distress.     Breath sounds: Normal breath sounds. No wheezing.     Comments: 2 ltr oxygen constant  Abdominal:     General: Abdomen is flat. Bowel sounds are normal.     Palpations: Abdomen is soft.  Musculoskeletal:     Right lower leg: Edema present.     Left lower leg: Edema present.  Skin:    General: Skin is warm.  Neurological:     Mental Status: She is alert and oriented to person, place, and time.  Psychiatric:        Mood and Affect: Mood normal.        Behavior: Behavior normal.    Lab Results  Component Value Date   WBC 11.2 (H) 06/13/2022   HGB 7.4 (L) 06/13/2022   HCT 23.8 (L) 06/13/2022   PLT 228 06/13/2022   GLUCOSE 108 (H) 06/13/2022   CHOL 110 02/27/2022   TRIG 77 02/27/2022   HDL 44 02/27/2022   LDLCALC 50 02/27/2022   ALT 28 06/13/2022  AST 30 06/13/2022   NA 132 (L) 06/13/2022   K 5.1 06/13/2022   CL 107 06/13/2022   CREATININE 3.05 (HH) 06/13/2022   BUN 70 (H) 06/13/2022   CO2 18 (L) 06/13/2022   HGBA1C 5.6 04/04/2021      Assessment & Plan:   Acute on chronic heart failure with preserved ejection fraction (HCC) Assessment & Plan: Continue taking  Entresto 24-26 2 times daily, Torsemide 20 mg daily  and Metoprolol 50 mg daily  Referral to cardiology, [place], Dr Bettina Gavia  B.  Orders: -     Ambulatory referral to Cardiology  COPD, moderate ([place]) Assessment & Plan: On  Continuous Oxygen '@2l'$ /min post hospitalization Oxygen saturation between 94-96 Breztri inhaler QD and Albuterol PRN  Orders: -     CBC with Differential/Platelet -     Comprehensive metabolic panel  Essential hypertension Assessment & Plan: Taking amlodipine 10 mg daily, Entresto 24-26 2 times daily, Torsemide 20 mg daily  and Metoprolol 50 mg daily   Nutrition: Stressed importance of moderation in sodium intake, saturated fat and cholesterol, caloric balance, sufficient intake of complex carbohydrates, fiber, calcium and iron.   Exercise: Stressed the importance of regular exercise.     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      Follow-up:  in 3 months for labs and regular follow up I, Syrena Burges have reviewed all documentation for this visit. The documentation on 07/03/22   for the exam, diagnosis, procedures, and orders are all accurate and complete.     An After Visit Summary was printed and given to the patient.    Neil Crouch, [place] [phone]

## 2022-07-04 ENCOUNTER — Telehealth: Payer: Self-pay

## 2022-07-04 ENCOUNTER — Other Ambulatory Visit: Payer: Self-pay | Admitting: Nurse Practitioner

## 2022-07-04 ENCOUNTER — Encounter: Payer: Self-pay | Admitting: Oncology

## 2022-07-04 DIAGNOSIS — J449 Chronic obstructive pulmonary disease, unspecified: Secondary | ICD-10-CM

## 2022-07-04 DIAGNOSIS — N185 Chronic kidney disease, stage 5: Secondary | ICD-10-CM

## 2022-07-04 DIAGNOSIS — N1832 Chronic kidney disease, stage 3b: Secondary | ICD-10-CM

## 2022-07-04 LAB — CBC WITH DIFFERENTIAL/PLATELET
Basophils Absolute: 0 10*3/uL (ref 0.0–0.2)
Basos: 0 %
EOS (ABSOLUTE): 0 10*3/uL (ref 0.0–0.4)
Eos: 0 %
Hematocrit: 31.1 % — ABNORMAL LOW (ref 34.0–46.6)
Hemoglobin: 10.2 g/dL — ABNORMAL LOW (ref 11.1–15.9)
Immature Grans (Abs): 0.8 10*3/uL — ABNORMAL HIGH (ref 0.0–0.1)
Immature Granulocytes: 5 %
Lymphocytes Absolute: 0.5 10*3/uL — ABNORMAL LOW (ref 0.7–3.1)
Lymphs: 3 %
MCH: 31.3 pg (ref 26.6–33.0)
MCHC: 32.8 g/dL (ref 31.5–35.7)
MCV: 95 fL (ref 79–97)
Monocytes Absolute: 1 10*3/uL — ABNORMAL HIGH (ref 0.1–0.9)
Monocytes: 6 %
Neutrophils Absolute: 14.3 10*3/uL — ABNORMAL HIGH (ref 1.4–7.0)
Neutrophils: 86 %
Platelets: 553 10*3/uL — ABNORMAL HIGH (ref 150–450)
RBC: 3.26 x10E6/uL — ABNORMAL LOW (ref 3.77–5.28)
RDW: 15.4 % (ref 11.7–15.4)
WBC: 16.7 10*3/uL — ABNORMAL HIGH (ref 3.4–10.8)

## 2022-07-04 LAB — COMPREHENSIVE METABOLIC PANEL
ALT: 32 IU/L (ref 0–32)
AST: 22 IU/L (ref 0–40)
Albumin/Globulin Ratio: 1.5 (ref 1.2–2.2)
Albumin: 3.5 g/dL — ABNORMAL LOW (ref 3.8–4.8)
Alkaline Phosphatase: 74 IU/L (ref 44–121)
BUN/Creatinine Ratio: 31 — ABNORMAL HIGH (ref 12–28)
BUN: 110 mg/dL (ref 8–27)
Bilirubin Total: 0.2 mg/dL (ref 0.0–1.2)
CO2: 20 mmol/L (ref 20–29)
Calcium: 8.9 mg/dL (ref 8.7–10.3)
Chloride: 104 mmol/L (ref 96–106)
Creatinine, Ser: 3.58 mg/dL — ABNORMAL HIGH (ref 0.57–1.00)
Globulin, Total: 2.3 g/dL (ref 1.5–4.5)
Glucose: 115 mg/dL — ABNORMAL HIGH (ref 70–99)
Potassium: 5.6 mmol/L — ABNORMAL HIGH (ref 3.5–5.2)
Sodium: 146 mmol/L — ABNORMAL HIGH (ref 134–144)
Total Protein: 5.8 g/dL — ABNORMAL LOW (ref 6.0–8.5)
eGFR: 12 mL/min/{1.73_m2} — ABNORMAL LOW (ref >59)

## 2022-07-04 NOTE — Telephone Encounter (Signed)
She has not been on the medication ever even after dr. Henrene Pastor put it in her chart she has never been on it or taken the medication period.

## 2022-07-04 NOTE — Telephone Encounter (Signed)
Patient's home health nurse called stating that the patient went to try and pick up her Entresto at the pharmacy and it was going to be 375$. Patient can't afford this medication and from looking at the patient's medication history the last time we sent that medication in last was 10/23. So in other words patient has not been on this medication even though we thoroughly reviewed her medications with her yesterday at her follow up appointment. Please advise what we need to do about this medication?

## 2022-07-05 ENCOUNTER — Telehealth: Payer: Self-pay

## 2022-07-05 DIAGNOSIS — I5033 Acute on chronic diastolic (congestive) heart failure: Secondary | ICD-10-CM | POA: Diagnosis not present

## 2022-07-05 DIAGNOSIS — I132 Hypertensive heart and chronic kidney disease with heart failure and with stage 5 chronic kidney disease, or end stage renal disease: Secondary | ICD-10-CM | POA: Diagnosis not present

## 2022-07-05 DIAGNOSIS — C672 Malignant neoplasm of lateral wall of bladder: Secondary | ICD-10-CM | POA: Diagnosis not present

## 2022-07-05 DIAGNOSIS — N179 Acute kidney failure, unspecified: Secondary | ICD-10-CM | POA: Diagnosis not present

## 2022-07-05 DIAGNOSIS — N185 Chronic kidney disease, stage 5: Secondary | ICD-10-CM | POA: Diagnosis not present

## 2022-07-05 DIAGNOSIS — D631 Anemia in chronic kidney disease: Secondary | ICD-10-CM | POA: Diagnosis not present

## 2022-07-05 NOTE — Progress Notes (Signed)
Care Management & Coordination Services Pharmacy Team  Reason for Encounter: Hypertension  Contacted patient to discuss hypertension disease state.   Current antihypertensive regimen:  Amlodipine '10mg'$  daily  Metoprolol Succinate '50mg'$  daily  Sacubitril-Valsartan 24-'26mg'$  two times daily  Torsemide '20mg'$  daily  Patient verbally confirms she is taking the above medications as directed. Yes  How often are you checking your Blood Pressure? infrequently  she checks her blood pressure in the morning before taking her medication.  Current home BP readings: 124/74 98, 134/74 97  Wrist or arm cuff: Arm  Caffeine intake: 1 cup of coffee  Salt intake:Limited   Any readings above 180/120? No  What recent interventions/DTPs have been made by any provider to improve Blood Pressure control since last CPP Visit: No changes   Any recent hospitalizations or ED visits since last visit with CPP? No  What diet changes have been made to improve Blood Pressure Control?  Pt is always watching what she eats   What exercise is being done to improve your Blood Pressure Control?  Pt is in physical therapy twice a week   Adherence Review: Is the patient currently on ACE/ARB medication? Yes Does the patient have >5 day gap between last estimated fill dates? Yes, pt stated the cost was way to expensive to get and skipped a month. She stated a nurse is looking into PAP for this medication. She could not remember who but stated this was being worked on. I see there was a note from Dr. Tobie Poet office stating they will follow up with her Cardiologist to make sure she is supposed to stay on this medication.   Star Rating Drugs:  Medication:  Last Fill: [facility]  07/02/22-05/08/22 30ds  Chart Updates: Recent office visits:  07/03/22 Neil Crouch FNP. Seen for hospital follow up. Referral to Cardiology. Increased Metoprolol to '50mg'$ .   05/30/22 Jerrell Belfast NP. Seen for routine visit. No  med changes.   Recent consult visits:  06/13/22 (Oncology) Wanita Chamberlain MD. Seen for Chemotherapy. No med changes.   06/06/22 (Oncology) Wanita Chamberlain MD. Seen for Chemotherapy. No med changes.   05/30/22 (Oncology) Wanita Chamberlain MD. Seen for follow up. No med changes.   05/23/22 (Oncology) Wanita Chamberlain MD. Seen for follow up. No med changes.   05/10/22 (Oncology) Wanita Chamberlain MD. Seen for follow up. No med changes.   Hospital visits:  None  Medications: Outpatient Encounter Medications as of 07/05/2022  Medication Sig   amLODipine (NORVASC) 10 MG tablet TAKE 1 TABLET(10 MG) BY MOUTH EVERY MORNING   atorvastatin (LIPITOR) 40 MG tablet Take 1 tablet (40 mg total) by mouth daily. Per ccm   budesonide (PULMICORT) 0.5 MG/2ML nebulizer solution Take 0.5 mg by nebulization 2 (two) times daily.   Cholecalciferol (VITAMIN D) 125 MCG (5000 UT) CAPS Take 1 capsule by mouth daily.   ipratropium (ATROVENT) 0.02 % nebulizer solution Take by nebulization 2 (two) times daily.   levalbuterol (XOPENEX) 1.25 MG/3ML nebulizer solution Take 1.25 mg by nebulization 2 (two) times daily.   metoprolol succinate (TOPROL-XL) 50 MG 24 hr tablet Take 50 mg by mouth daily. Take with or immediately following a meal.   ondansetron (ZOFRAN-ODT) 4 MG disintegrating tablet Take 4 mg by mouth every 8 (eight) hours as needed for nausea or vomiting.   pantoprazole (PROTONIX) 40 MG tablet Take 40 mg by mouth daily.   polyethylene glycol powder (GLYCOLAX/MIRALAX) 17 GM/SCOOP powder Take 17 g by mouth daily.   predniSONE (STERAPRED UNI-PAK 21  TAB) 5 MG (21) TBPK tablet Take 5 mg by mouth See admin instructions. follow package directions   rOPINIRole (REQUIP) 2 MG tablet TAKE 1 TABLET(2 MG) BY MOUTH IN THE MORNING AND AT BEDTIME   sacubitril-valsartan (ENTRESTO) 24-26 MG Take 1 tablet by mouth 2 (two) times daily.   Sennosides-Docusate Sodium (SB DOCUSATE SODIUM-SENNA PO) Take 1 tablet by mouth in the  morning and at bedtime.   sodium bicarbonate 650 MG tablet Take 650 mg by mouth 3 (three) times daily.   torsemide (DEMADEX) 20 MG tablet Take 1 tablet (20 mg total) by mouth daily.   VENTOLIN HFA 108 (90 Base) MCG/ACT inhaler    No facility-administered encounter medications on file as of 07/05/2022.    Recent Office Vitals: BP Readings from Last 3 Encounters:  07/03/22 (!) 142/86  06/14/22 (!) 104/48  06/13/22 (!) 119/59   Pulse Readings from Last 3 Encounters:  07/03/22 97  06/14/22 85  06/13/22 94    Wt Readings from Last 3 Encounters:  07/03/22 166 lb (75.3 kg)  06/13/22 161 lb 1.6 oz (73.1 kg)  06/12/22 163 lb (73.9 kg)     Kidney Function Lab Results  Component Value Date/Time   CREATININE 3.58 (H) 07/03/2022 04:30 PM   CREATININE 3.05 (HH) 06/13/2022 10:21 AM   CREATININE 2.97 (H) 06/06/2022 09:17 AM   GFRNONAA 15 (L) 06/13/2022 10:21 AM   GFRAA 29 (L) 03/28/2020 07:53 AM       Latest Ref Rng & Units 07/03/2022    4:30 PM 06/13/2022   10:21 AM 06/06/2022    9:17 AM  BMP  Glucose 70 - 99 mg/dL 115  108  104   BUN 8 - 27 mg/dL 110  70  55   Creatinine 0.57 - 1.00 mg/dL 3.58  3.05  2.97   BUN/Creat Ratio 12 - 28 31     Sodium 134 - 144 mmol/L 146  132  139   Potassium 3.5 - 5.2 mmol/L 5.6  5.1  4.9   Chloride 96 - 106 mmol/L 104  107  112   CO2 20 - 29 mmol/L '20  18  19   '$ Calcium 8.7 - 10.3 mg/dL 8.9  8.2  8.3       Elray Mcgregor, [place] Pharmacist Assistant  [phone]

## 2022-07-05 NOTE — Telephone Encounter (Signed)
Given permission to proceed. Dr. Tobie Poet

## 2022-07-05 NOTE — Telephone Encounter (Signed)
Mrs. Ulibarri called and she has reached out to [facility] for a follow-up appointment.  She also has reached out to Dr. Ara Kussmaul office for follow-up.  Results from recent visit was faxed to Dr. Nila Nephew along with her current lab results.

## 2022-07-09 ENCOUNTER — Ambulatory Visit: Payer: Self-pay

## 2022-07-09 NOTE — Patient Outreach (Signed)
  Care Coordination   Follow Up Visit Note   07/09/2022 Name: Alexandra [name] MRN: [id number] DOB: Mar 01, 1941  Alexandra [name] is a 82 y.o. year old female who sees Cox, Elnita Maxwell, MD for primary care. I spoke with  Alexandra [name] by phone today.  What matters to the patients health and wellness today?  Placed call to patient for follow up TOC.  Patient reports that she is doing well. Reports she is weighing daily.  Reports todays weight of 157 pounds. Denies any shortness of breath.  Reports she is wearing her oxygen at 2 Lnc.  Reports slight well to her ankles. States she has all her medications and is taking them as prescribed.   Denies any needs today.       SDOH assessments and interventions completed:  No     Care Coordination Interventions:  No, not indicated   Follow up plan: No further intervention required.   Encounter Outcome:  Pt. Visit Completed   Alexandra Rand RN, BSN, CEN RN Case Freight forwarder for [employer] Mobile: [phone]

## 2022-07-10 ENCOUNTER — Other Ambulatory Visit: Payer: Self-pay | Admitting: Family Medicine

## 2022-07-10 ENCOUNTER — Telehealth: Payer: Self-pay

## 2022-07-10 DIAGNOSIS — N179 Acute kidney failure, unspecified: Secondary | ICD-10-CM | POA: Diagnosis not present

## 2022-07-10 DIAGNOSIS — N185 Chronic kidney disease, stage 5: Secondary | ICD-10-CM | POA: Diagnosis not present

## 2022-07-10 DIAGNOSIS — I5033 Acute on chronic diastolic (congestive) heart failure: Secondary | ICD-10-CM | POA: Diagnosis not present

## 2022-07-10 DIAGNOSIS — R0602 Shortness of breath: Secondary | ICD-10-CM

## 2022-07-10 DIAGNOSIS — I132 Hypertensive heart and chronic kidney disease with heart failure and with stage 5 chronic kidney disease, or end stage renal disease: Secondary | ICD-10-CM | POA: Diagnosis not present

## 2022-07-10 DIAGNOSIS — C672 Malignant neoplasm of lateral wall of bladder: Secondary | ICD-10-CM | POA: Diagnosis not present

## 2022-07-10 DIAGNOSIS — D631 Anemia in chronic kidney disease: Secondary | ICD-10-CM | POA: Diagnosis not present

## 2022-07-10 NOTE — Telephone Encounter (Signed)
Stacy from [facility] called and stated when she saw patient today she didn't have very good air exchange in her lungs and patient was complaining of pain  on left side, and wanted to know if they could get an x-ray order of the chest sent to [facility]. Please advise

## 2022-07-11 DIAGNOSIS — C672 Malignant neoplasm of lateral wall of bladder: Secondary | ICD-10-CM | POA: Diagnosis not present

## 2022-07-11 DIAGNOSIS — N179 Acute kidney failure, unspecified: Secondary | ICD-10-CM | POA: Diagnosis not present

## 2022-07-11 DIAGNOSIS — I5033 Acute on chronic diastolic (congestive) heart failure: Secondary | ICD-10-CM | POA: Diagnosis not present

## 2022-07-11 DIAGNOSIS — I132 Hypertensive heart and chronic kidney disease with heart failure and with stage 5 chronic kidney disease, or end stage renal disease: Secondary | ICD-10-CM | POA: Diagnosis not present

## 2022-07-11 DIAGNOSIS — N185 Chronic kidney disease, stage 5: Secondary | ICD-10-CM | POA: Diagnosis not present

## 2022-07-11 DIAGNOSIS — J90 Pleural effusion, not elsewhere classified: Secondary | ICD-10-CM | POA: Diagnosis not present

## 2022-07-11 DIAGNOSIS — R0602 Shortness of breath: Secondary | ICD-10-CM | POA: Diagnosis not present

## 2022-07-11 DIAGNOSIS — D631 Anemia in chronic kidney disease: Secondary | ICD-10-CM | POA: Diagnosis not present

## 2022-07-11 NOTE — Telephone Encounter (Signed)
X-Ray Order was sent to [facility]. Patient made aware, also call Home health nurse and left voicemail with details.

## 2022-07-12 ENCOUNTER — Other Ambulatory Visit: Payer: Self-pay

## 2022-07-12 ENCOUNTER — Telehealth: Payer: Self-pay

## 2022-07-12 DIAGNOSIS — I132 Hypertensive heart and chronic kidney disease with heart failure and with stage 5 chronic kidney disease, or end stage renal disease: Secondary | ICD-10-CM | POA: Diagnosis not present

## 2022-07-12 DIAGNOSIS — I5033 Acute on chronic diastolic (congestive) heart failure: Secondary | ICD-10-CM | POA: Diagnosis not present

## 2022-07-12 DIAGNOSIS — D631 Anemia in chronic kidney disease: Secondary | ICD-10-CM | POA: Diagnosis not present

## 2022-07-12 DIAGNOSIS — R0602 Shortness of breath: Secondary | ICD-10-CM

## 2022-07-12 DIAGNOSIS — N179 Acute kidney failure, unspecified: Secondary | ICD-10-CM | POA: Diagnosis not present

## 2022-07-12 DIAGNOSIS — C672 Malignant neoplasm of lateral wall of bladder: Secondary | ICD-10-CM | POA: Diagnosis not present

## 2022-07-12 DIAGNOSIS — N185 Chronic kidney disease, stage 5: Secondary | ICD-10-CM | POA: Diagnosis not present

## 2022-07-12 NOTE — Progress Notes (Signed)
  Chronic Care Management   Note  07/12/2022 Name: Alexandra [name] MRN: [id number] DOB: [birth year]  Alexandra [name] is a 82 y.o. year old female who is a primary care patient of Cox, Kirsten, MD. I reached out to Alexandra [name] by phone today in response to a referral sent by Alexandra [name]'s PCP.  Ms. Corti was given information about Chronic Care Management services today including:  CCM service includes personalized support from designated clinical staff supervised by the physician, including individualized plan of care and coordination with other care providers 24/7 contact phone numbers for assistance for urgent and routine care needs. Service will only be billed when office clinical staff spend 20 minutes or more in a month to coordinate care. Only one practitioner may furnish and bill the service in a calendar month. The patient may stop CCM services at amy time (effective at the end of the month) by phone call to the office staff. The patient will be responsible for cost sharing (co-pay) or up to 20% of the service fee (after annual deductible is met)  Ms. Alexandra [name]  agreedto scheduling an appointment with the CCM RN Case Manager   Follow up plan: Patient agreed to scheduled appointment with RN Case Manager on 07/17/2022(date/time).   Alexandra [name], [place], [place] [address] Direct Dial: [phone] [name]

## 2022-07-13 ENCOUNTER — Telehealth: Payer: Self-pay

## 2022-07-13 ENCOUNTER — Telehealth: Payer: Self-pay | Admitting: *Deleted

## 2022-07-13 ENCOUNTER — Other Ambulatory Visit: Payer: Self-pay | Admitting: Nurse Practitioner

## 2022-07-13 DIAGNOSIS — I132 Hypertensive heart and chronic kidney disease with heart failure and with stage 5 chronic kidney disease, or end stage renal disease: Secondary | ICD-10-CM | POA: Diagnosis not present

## 2022-07-13 DIAGNOSIS — N390 Urinary tract infection, site not specified: Secondary | ICD-10-CM | POA: Diagnosis not present

## 2022-07-13 DIAGNOSIS — N185 Chronic kidney disease, stage 5: Secondary | ICD-10-CM | POA: Diagnosis not present

## 2022-07-13 DIAGNOSIS — N179 Acute kidney failure, unspecified: Secondary | ICD-10-CM | POA: Diagnosis not present

## 2022-07-13 DIAGNOSIS — N2889 Other specified disorders of kidney and ureter: Secondary | ICD-10-CM | POA: Diagnosis not present

## 2022-07-13 DIAGNOSIS — I5033 Acute on chronic diastolic (congestive) heart failure: Secondary | ICD-10-CM | POA: Diagnosis not present

## 2022-07-13 DIAGNOSIS — R071 Chest pain on breathing: Secondary | ICD-10-CM

## 2022-07-13 DIAGNOSIS — D631 Anemia in chronic kidney disease: Secondary | ICD-10-CM | POA: Diagnosis not present

## 2022-07-13 DIAGNOSIS — C672 Malignant neoplasm of lateral wall of bladder: Secondary | ICD-10-CM | POA: Diagnosis not present

## 2022-07-13 MED ORDER — NITROGLYCERIN 0.4 MG SL SUBL: 0.4000 mg | SUBLINGUAL_TABLET | SUBLINGUAL | 3 refills | Status: DC | PRN

## 2022-07-13 NOTE — Telephone Encounter (Signed)
Elmyra Ricks from [facility] called and wanted to report that patient loss 7 lbs over night. Stated the patient did restart Entresto on Tuesday and that's the only thing new that she done. Her X-Ray did show a little fluid but was good and she does complain of chest pain something but doesn't have any medication at home to take for it, stated the patient doesn't know if it's actually chest pain or indigestion. Please advise Nurse can be reach at. [phone]

## 2022-07-13 NOTE — Telephone Encounter (Cosign Needed)
Spoke to husband today and pt has an appt with Cardiologist 07/20/22 to discuss Entresto and PAP for it. I will follow up next week to check status.   Elray Mcgregor, [place] Pharmacist Assistant  [phone]

## 2022-07-13 NOTE — Progress Notes (Signed)
  Care Coordination  Outreach Note  07/13/2022 Name: Alexandra [name] MRN: [id number] DOB: [birth year]   Care Coordination Outreach Attempts: An unsuccessful telephone outreach was attempted today to offer the patient information about available care coordination services as a benefit of their health plan.   Follow Up Plan:  Additional outreach attempts will be made to offer the patient care coordination information and services.   Encounter Outcome:  No Answer  Julian Hy, [place] Direct Dial: [phone]

## 2022-07-13 NOTE — Progress Notes (Cosign Needed)
07/13/22- I called pt today and spoke to husband to follow up who is doing her PAP for her Alexandra [name] since she expressed concerns about cost.  Husband stated she has an appt this Friday 07/20/22 with her Cardiologist to discuss this and to see if this is something she is going to stay on and discuss PAP for it. I will follow up next week to see the status.    Elray Mcgregor, [place] Pharmacist Assistant  [phone]

## 2022-07-16 DIAGNOSIS — N185 Chronic kidney disease, stage 5: Secondary | ICD-10-CM | POA: Diagnosis not present

## 2022-07-16 DIAGNOSIS — I5033 Acute on chronic diastolic (congestive) heart failure: Secondary | ICD-10-CM | POA: Diagnosis not present

## 2022-07-16 DIAGNOSIS — D631 Anemia in chronic kidney disease: Secondary | ICD-10-CM | POA: Diagnosis not present

## 2022-07-16 DIAGNOSIS — I132 Hypertensive heart and chronic kidney disease with heart failure and with stage 5 chronic kidney disease, or end stage renal disease: Secondary | ICD-10-CM | POA: Diagnosis not present

## 2022-07-16 DIAGNOSIS — N179 Acute kidney failure, unspecified: Secondary | ICD-10-CM | POA: Diagnosis not present

## 2022-07-16 DIAGNOSIS — C672 Malignant neoplasm of lateral wall of bladder: Secondary | ICD-10-CM | POA: Diagnosis not present

## 2022-07-17 ENCOUNTER — Telehealth: Payer: Medicare Other

## 2022-07-17 ENCOUNTER — Ambulatory Visit (INDEPENDENT_AMBULATORY_CARE_PROVIDER_SITE_OTHER): Payer: Medicare Other

## 2022-07-17 DIAGNOSIS — I132 Hypertensive heart and chronic kidney disease with heart failure and with stage 5 chronic kidney disease, or end stage renal disease: Secondary | ICD-10-CM | POA: Diagnosis not present

## 2022-07-17 DIAGNOSIS — N185 Chronic kidney disease, stage 5: Secondary | ICD-10-CM | POA: Diagnosis not present

## 2022-07-17 DIAGNOSIS — I5033 Acute on chronic diastolic (congestive) heart failure: Secondary | ICD-10-CM

## 2022-07-17 DIAGNOSIS — D631 Anemia in chronic kidney disease: Secondary | ICD-10-CM | POA: Diagnosis not present

## 2022-07-17 DIAGNOSIS — I10 Essential (primary) hypertension: Secondary | ICD-10-CM

## 2022-07-17 DIAGNOSIS — N179 Acute kidney failure, unspecified: Secondary | ICD-10-CM | POA: Diagnosis not present

## 2022-07-17 DIAGNOSIS — G2581 Restless legs syndrome: Secondary | ICD-10-CM | POA: Insufficient documentation

## 2022-07-17 DIAGNOSIS — J449 Chronic obstructive pulmonary disease, unspecified: Secondary | ICD-10-CM

## 2022-07-17 DIAGNOSIS — C672 Malignant neoplasm of lateral wall of bladder: Secondary | ICD-10-CM | POA: Diagnosis not present

## 2022-07-17 NOTE — Chronic Care Management (AMB) (Signed)
Chronic Care Management   CCM RN Visit Note  07/17/2022 Name: ARSHIKA TAMS MRN: [id number] DOB: 09-07-1940  Subjective: Alexandra [name] is a 82 y.o. year old female who is a primary care patient of Cox, Kirsten, MD. The patient was referred to the Chronic Care Management team for assistance with care management needs subsequent to provider initiation of CCM services and plan of care.    Today's Visit:  Engaged with patient by telephone for initial visit.        Goals Addressed             This Visit's Progress    CCM Expected Outcome:  Monitor, Self-Manage and Reduce Symptoms of Heart Failure       Current Barriers:  Knowledge Deficits related to new diagnosis of heart failure with hospitalization on 06-24-2022 to 06-29-2022 for shortness of breath with diagnosis of acute on chronic heart failure Care Coordination needs related to cost of Entresto and the need for phram D support in a patient with HF Chronic Disease Management support and education needs related to effective management of HF Financial Constraints.   Planned Interventions: Basic overview and discussion of pathophysiology of Heart Failure reviewed Provided education on low sodium diet Reviewed Heart Failure Action Plan in depth and provided written copy Assessed need for readable accurate scales in home Provided education about placing scale on hard, flat surface Advised patient to weigh each morning after emptying bladder Discussed importance of daily weight and advised patient to weigh and record daily Reviewed role of diuretics in prevention of fluid overload and management of heart failure Discussed the importance of keeping all appointments with provider Provided patient with education about the role of exercise in the management of heart failure Referral made to community resources care guide team for assistance with food resources and meal delivery in her area Advised patient to discuss HF and changes in  heart health with provider Screening for signs and symptoms of depression related to chronic disease state  Assessed social determinant of health barriers The patient has an appointment with cardiologist on 07-20-2022 The patient was discharged home on oxygen 2 liters via [place]. She only takes off when going to the bathroom. Review of safety and falls prevention. The patient uses a walker. The patient denies falls.   Symptom Management: Take medications as prescribed   Attend all scheduled provider appointments Call provider office for new concerns or questions  call the Suicide and Crisis Lifeline: 988 call the [place] National Suicide Prevention Lifeline: (367) 661-3971 or TTY: 682 024 2363 TTY 623-246-6706) to talk to a trained counselor call 1-800-273-TALK (toll free, 24 hour hotline) if experiencing a Mental Health or [place]  call office if I gain more than 2 pounds in one day or 5 pounds in one week keep legs up while sitting use salt in moderation watch for swelling in feet, ankles and legs every day weigh myself daily develop a rescue plan follow rescue plan if symptoms flare-up track symptoms and what helps feel better or worse dress right for the weather, hot or cold  Follow Up Plan: Telephone follow up appointment with care management team member scheduled for: 09-11-2022 at 230 pm       CCM Expected Outcome:  Monitor, Self-Manage, and Reduce Symptoms of Hypertension       Current Barriers:  Care Coordination needs related to resources for food delivery/resources  in a patient with HTN Chronic Disease Management support and education needs related to effective management  of HTN  Planned Interventions: Evaluation of current treatment plan related to hypertension self management and patient's adherence to plan as established by provider;   Provided education to patient re: stroke prevention, s/s of heart attack and stroke; Reviewed prescribed diet heart healthy  diet  Reviewed medications with patient and discussed importance of compliance;  Discussed plans with patient for ongoing care management follow up and provided patient with direct contact information for care management team; Advised patient, providing education and rationale, to monitor blood pressure daily and record, calling PCP for findings outside established parameters;  Reviewed scheduled/upcoming provider appointments including: 10-05-2022 at 0920 am with the pcp Advised patient to discuss changes in HTN or heart health with provider; Provided education on prescribed diet heart healthy. The patient states that she sometimes does not have and appetite, sometimes she does. She denies any issues with dehydration. She does say it is hard to prepare meals due to her and her husband both being "sick". She is receptive to talking to the care guides for assistance with resources in her area for food delivery of meals. Care guide referral pending ;  Discussed complications of poorly controlled blood pressure such as heart disease, stroke, circulatory complications, vision complications, kidney impairment, sexual dysfunction;  Screening for signs and symptoms of depression related to chronic disease state;  Assessed social determinant of health barriers;   Symptom Management: Take medications as prescribed   Attend all scheduled provider appointments Call provider office for new concerns or questions  call the Suicide and Crisis Lifeline: 988 call the [place] National Suicide Prevention Lifeline: 814-426-4852 or TTY: (862) 172-9092 TTY (432) 837-2600) to talk to a trained counselor call 1-800-273-TALK (toll free, 24 hour hotline) if experiencing a Mental Health or [place]  check blood pressure weekly learn about high blood pressure develop an action plan for high blood pressure keep all doctor appointments take medications for blood pressure exactly as prescribed report new symptoms  to your doctor  Follow Up Plan: Telephone follow up appointment with care management team member scheduled for: 09-11-2022 at 76 pm       CCM:  Maintain, Monitor and Self-Manage Symptoms of COPD       Current Barriers:  Chronic Disease Management support and education needs related to effective management of COPD  Planned Interventions: Provided patient with basic written and verbal COPD education on self care/management/and exacerbation prevention Advised patient to track and manage COPD triggers Provided written and verbal instructions on pursed lip breathing and utilized returned demonstration as teach back Provided instruction about proper use of medications used for management of COPD including inhalers Advised patient to self assesses COPD action plan zone and make appointment with provider if in the yellow zone for 48 hours without improvement Provided education about and advised patient to utilize infection prevention strategies to reduce risk of respiratory infection Discussed the importance of adequate rest and management of fatigue with COPD Referral made to community resources care guide team for assistance with food resources  Screening for signs and symptoms of depression related to chronic disease state  Assessed social determinant of health barriers Recent hospital stay from 06-24-2022 to 06-29-2022 for shortness of breath with diagnosis of new onset of HF. The patient in now Oxygen dependent on 2 liters via [place], only takes off when she goes to the bathroom. Review of safety and falls prevention  Symptom Management: Take medications as prescribed   Attend all scheduled provider appointments Call provider office for new concerns or  questions  call the Suicide and Crisis Lifeline: 988 call the [place] National Suicide Prevention Lifeline: 501-687-6465 or TTY: 636-451-8040 TTY (810)044-6134) to talk to a trained counselor call 1-800-273-TALK (toll free, 24 hour hotline) go to  [facility] [address], [place] [phone]) if experiencing a [place] or [place]  avoid second hand smoke eliminate smoking in my home identify and remove indoor air pollutants limit outdoor activity during cold weather listen for public air quality announcements every day do breathing exercises every day develop a rescue plan eliminate symptom triggers at home follow rescue plan if symptoms flare-up  Follow Up Plan: Telephone follow up appointment with care management team member scheduled for: 09-11-2022 at 230 pm          Plan:Telephone follow up appointment with care management team member scheduled for:  09-11-2022 at 63 pm  [place], MSN, CCM RN Care Manager  Chronic Care Management Direct Number: [phone]

## 2022-07-17 NOTE — Plan of Care (Signed)
Chronic Care Management Provider Comprehensive Care Plan    07/17/2022 Name: Alexandra [name] MRN: [id number] DOB: 02-16-1941  Referral to Chronic Care Management (CCM) services was placed by Provider: Neil Crouch, FNP on Date: 07-04-2022. The patients pcp is Dr. Rochel Brome.  Chronic Condition 1: COPD Provider Assessment and Plan On  Continuous Oxygen @2l$ /min post hospitalization Oxygen saturation between 94-96 Breztri inhaler QD and Albuterol PRN   Orders: -     CBC with Differential/Platelet -     Comprehensive metabolic panel     Expected Outcome/Goals Addressed This Visit (Provider CCM goals/Provider Assessment and plan    CCM (COPD) EXPECTED OUTCOME: MONITOR, SELF-MANAGE AND REDUCE SYMPTOMS OF COPD   Symptom Management Condition 1: Take all medications as prescribed Attend all scheduled provider appointments Call provider office for new concerns or questions  call the Suicide and Crisis Lifeline: 988 call the [place] National Suicide Prevention Lifeline: (706)409-7023 or TTY: 443-535-7586 TTY 5072659946) to talk to a trained counselor call 1-800-273-TALK (toll free, 24 hour hotline) if experiencing a Mental Health or [place]  eliminate smoking in my home identify and remove indoor air pollutants limit outdoor activity during cold weather listen for public air quality announcements every day develop a rescue plan eliminate symptom triggers at home follow rescue plan if symptoms flare-up  Chronic Condition 2: HTN Provider Assessment and Plan Taking amlodipine 10 mg daily, Entresto 24-26 2 times daily, Torsemide 20 mg daily  and Metoprolol 50 mg daily    Nutrition: Stressed importance of moderation in sodium intake, saturated fat and cholesterol, caloric balance, sufficient intake of complex carbohydrates, fiber, calcium and iron.   Exercise: Stressed the importance of regular exercise.      Orders: -     CBC with Differential/Platelet -      Comprehensive metabolic panel     Expected Outcome/Goals Addressed This Visit (Provider CCM goals/Provider Assessment and plan   CCM (HYPERTENSION)  EXPECTED OUTCOME:  MONITOR,SELF- MANAGE AND REDUCE SYMPTOMS OF HYPERTENSION  Symptom Management Condition 2: Take all medications as prescribed Attend all scheduled provider appointments Call provider office for new concerns or questions  call the Suicide and Crisis Lifeline: 988 call the [place] National Suicide Prevention Lifeline: 478-388-8501 or TTY: 6693752912 [place] 352-194-8222) to talk to a trained counselor call 1-800-273-TALK (toll free, 24 hour hotline) go to [facility] [address] [place] [phone]) if experiencing a Mental Health or [place]  check blood pressure weekly learn about high blood pressure call doctor for signs and symptoms of high blood pressure keep all doctor appointments take medications for blood pressure exactly as prescribed report new symptoms to your doctor  Chronic Condition 3: HF Provider Assessment and Plan Taking torsemide 20 mg OD Suggested to elevate the legs all the time And using compression shocksContinue taking  Entresto 24-26 2 times daily, Torsemide 20 mg daily  and Metoprolol 50 mg daily   Referral to cardiology, [place], Dr Bettina Gavia B.   Orders: -     Ambulatory referral to Cardiology  .   Expected Outcome/Goals Addressed This Visit (Provider CCM goals/Provider Assessment and plan   CCM (HEART FAILURE)  EXPECTED OUTCOME:  MONITOR, SELF-MANAGE AND REDUCE SYMPTOMS OF HEART FAILURE   Symptom Management Condition 3: Take all medications as prescribed Attend all scheduled provider appointments Call provider office for new concerns or questions  call the Suicide and Crisis Lifeline: 988 call the [place] National Suicide Prevention Lifeline: (651) 443-1786 or TTY: 417-046-8768  TTY (240)836-6752) to talk to a trained  counselor call 1-800-273-TALK (toll free, 24 hour hotline) go to [facility] [address], [place] [phone]) if experiencing a Mental Health or [place]  call office if I gain more than 2 pounds in one day or 5 pounds in one week keep legs up while sitting use salt in moderation watch for swelling in feet, ankles and legs every day weigh myself daily develop a rescue plan follow rescue plan if symptoms flare-up track symptoms and what helps feel better or worse dress right for the weather, hot or cold  Problem List Patient Active Problem List   Diagnosis Date Noted   Hypomagnesemia 06/14/2022   Anemia due to antineoplastic chemotherapy 06/13/2022   Fatigue due to radiation therapy 06/13/2022   Frail elderly 06/13/2022   Gastroesophageal reflux disease 06/06/2022   Acute on chronic heart failure with preserved ejection fraction ([place]) 06/06/2022   Hyperkalemia 05/23/2022   Genetic testing 05/14/2022   Chemotherapy follow-up examination 05/11/2022   Exposure to medical therapeutic radiation 05/11/2022   Chronic kidney disease (CKD) 04/24/2022   Counseling and coordination of care 04/24/2022   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 ([place]) 11/23/2021   Pain of right humerus 11/23/2021   Frequent falls 11/23/2021   Laceration of left lower extremity 10/20/2021   Pedal edema [id number]   Diastolic dysfunction [id number]   Trochanteric bursitis, left hip 07/24/2021   Trochanteric bursitis of right hip 12/07/2020   Female stress incontinence 08/03/2019   Mixed conductive and sensorineural hearing loss, 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 Per ccm, Disp: 90 tablet, Rfl: 3   budesonide (PULMICORT) 0.5 MG/2ML nebulizer solution, Take 0.5 mg by nebulization 2 (two) times daily., Disp: , Rfl:    Cholecalciferol (VITAMIN D) 125 MCG (5000 UT) CAPS, Take 1 capsule by mouth daily., Disp: , Rfl:    ipratropium (ATROVENT) 0.02 % nebulizer solution, Take by nebulization 2 (two) times daily., Disp: , Rfl:    levalbuterol (XOPENEX) 1.25 MG/3ML nebulizer solution, Take 1.25 mg by nebulization 2 (two) times daily., Disp: , Rfl:    metoprolol succinate (TOPROL-XL) 50 MG 24 hr tablet, Take 50 mg by mouth daily. Take with or immediately following a meal., Disp: , Rfl:    nitroGLYCERIN (NITROSTAT) 0.4 MG SL tablet, Place 1 tablet (0.4 mg total) under the tongue every 5 (five) minutes as needed for chest pain., Disp: 30 tablet, Rfl: 3   ondansetron (ZOFRAN-ODT) 4 MG disintegrating tablet, Take 4 mg by mouth every 8 (eight) hours as needed for nausea or vomiting., Disp: , Rfl:    pantoprazole (PROTONIX) 40 MG tablet, Take 40 mg by mouth daily., Disp: , Rfl:    polyethylene glycol powder (GLYCOLAX/MIRALAX) 17 GM/SCOOP powder, Take 17 g by mouth daily., Disp: , Rfl:    predniSONE (STERAPRED UNI-PAK 21 TAB) 5 MG (21) TBPK tablet, Take 5 mg by  mouth See admin instructions. follow package directions, Disp: , Rfl:    rOPINIRole (REQUIP) 2 MG tablet, TAKE 1 TABLET(2 MG) BY MOUTH IN THE MORNING AND AT BEDTIME, Disp: 180 tablet, Rfl: 6   sacubitril-valsartan (ENTRESTO) 24-26 MG, Take 1 tablet by mouth 2 (two) times daily., Disp: 60 tablet, Rfl: 6   Sennosides-Docusate Sodium (SB DOCUSATE SODIUM-SENNA PO), Take 1 tablet by mouth in the morning and at bedtime., Disp:  , Rfl:    sodium bicarbonate 650 MG tablet, Take 650 mg by mouth 3 (three) times daily., Disp: , Rfl:    torsemide (DEMADEX) 20 MG tablet, Take 1 tablet (20 mg total) by mouth daily., Disp: 30 tablet, Rfl: 3   VENTOLIN HFA 108 (90 Base) MCG/ACT inhaler, , Disp: , Rfl:   Cognitive Assessment Identity Confirmed: : Name; DOB Cognitive Status: Normal Other:  : Multidisciplinary Case Concference   Functional Assessment Hearing Difficulty or Deaf: no Wear Glasses or Blind: yes Vision Management: wears glasses Concentrating, Remembering or Making Decisions Difficulty (CP): no Difficulty Communicating: no Difficulty Eating/Swallowing: no Walking or Climbing Stairs Difficulty: yes Walking or Climbing Stairs: ambulation difficulty, requires equipment Mobility Management: the patient uses her walker, denies falls Dressing/Bathing Difficulty: no Doing Errands Independently Difficulty (such as shopping) (CP): yes Errands Management: dependent on a "neighbor" to help with driving and getting errands and groceries done   Caregiver Assessment  Primary Source of Support/Comfort: spouse Name of Support/Comfort Primary Source: Luellar [place] People in Home: spouse Name(s) of People in Home: Herbie [place]- husband Family Caregiver if Needed: none Primary Roles/Responsibilities: retired   Planned Interventions  Basic overview and discussion of pathophysiology of Heart Failure reviewed Provided education on low sodium diet Reviewed Heart Failure Action Plan in depth and provided written copy Assessed need for readable accurate scales in home Provided education about placing scale on hard, flat surface Advised patient to weigh each morning after emptying bladder Discussed importance of daily weight and advised patient to weigh and record daily Reviewed role of diuretics in prevention of fluid overload and management of heart failure Discussed the importance of keeping all appointments with  provider Provided patient with education about the role of exercise in the management of heart failure Referral made to community resources care guide team for assistance with food resources and meal delivery in her area Advised patient to discuss HF and changes in heart health with provider Screening for signs and symptoms of depression related to chronic disease state  Assessed social determinant of health barriers The patient has an appointment with cardiologist on 07-20-2022 The patient was discharged home on oxygen 2 liters via [place]. She only takes off when going to the bathroom. Review of safety and falls prevention. The patient uses a walker. The patient denies falls.  Evaluation of current treatment plan related to hypertension self management and patient's adherence to plan as established by provider;   Provided education to patient re: stroke prevention, s/s of heart attack and stroke; Reviewed prescribed diet heart healthy diet  Reviewed medications with patient and discussed importance of compliance;  Discussed plans with patient for ongoing care management follow up and provided patient with direct contact information for care management team; Advised patient, providing education and rationale, to monitor blood pressure daily and record, calling PCP for findings outside established parameters;  Reviewed scheduled/upcoming provider appointments including: 10-05-2022 at 0920 am with the pcp Advised patient to discuss changes in HTN or heart health with provider; Provided education on prescribed diet heart healthy. The  patient states that she sometimes does not have and appetite, sometimes she does. She denies any issues with dehydration. She does say it is hard to prepare meals due to her and her husband both being "sick". She is receptive to talking to the care guides for assistance with resources in her area for food delivery of meals. Care guide referral pending ;  Discussed complications  of poorly controlled blood pressure such as heart disease, stroke, circulatory complications, vision complications, kidney impairment, sexual dysfunction;  Screening for signs and symptoms of depression related to chronic disease state;  Assessed social determinant of health barriers;  Provided patient with basic written and verbal COPD education on self care/management/and exacerbation prevention Advised patient to track and manage COPD triggers Provided written and verbal instructions on pursed lip breathing and utilized returned demonstration as teach back Provided instruction about proper use of medications used for management of COPD including inhalers Advised patient to self assesses COPD action plan zone and make appointment with provider if in the yellow zone for 48 hours without improvement Provided education about and advised patient to utilize infection prevention strategies to reduce risk of respiratory infection Discussed the importance of adequate rest and management of fatigue with COPD Referral made to community resources care guide team for assistance with food resources  Screening for signs and symptoms of depression related to chronic disease state  Assessed social determinant of health barriers Recent hospital stay from 06-24-2022 to 06-29-2022 for shortness of breath with diagnosis of new onset of HF. The patient in now Oxygen dependent on 2 liters via [place], only takes off when she goes to the bathroom. Review of safety and falls prevention    Interaction and coordination with outside resources, practitioners, and providers See CCM Referral  Care Plan: Printed and mailed to patient

## 2022-07-17 NOTE — Patient Instructions (Addendum)
Please call the care guide team at [phone] if you need to cancel or reschedule your appointment.   If you are experiencing a Mental Health or [place] or need someone to talk to, please call the Suicide and Crisis Lifeline: 988 call the [place] National Suicide Prevention Lifeline: (941) 093-5539 or TTY: (320)053-5484 TTY 479-156-8961) to talk to a trained counselor call 1-800-273-TALK (toll free, 24 hour hotline) go to [facility] [place] [phone])   Following is a copy of the CCM Program Consent:  CCM service includes personalized support from designated clinical staff supervised by the physician, including individualized plan of care and coordination with other care providers 24/7 contact phone numbers for assistance for urgent and routine care needs. Service will only be billed when office clinical staff spend 20 minutes or more in a month to coordinate care. Only one practitioner may furnish and bill the service in a calendar month. The patient may stop CCM services at amy time (effective at the end of the month) by phone call to the office staff. The patient will be responsible for cost sharing (co-pay) or up to 20% of the service fee (after annual deductible is met)  Following is a copy of your full provider care plan:   Goals Addressed             This Visit's Progress    CCM Expected Outcome:  Monitor, Self-Manage and Reduce Symptoms of Heart Failure       Current Barriers:  Knowledge Deficits related to new diagnosis of heart failure with hospitalization on 06-24-2022 to 06-29-2022 for shortness of breath with diagnosis of acute on chronic heart failure Care Coordination needs related to cost of Entrest[employer] and the need for phram D support in a patient with HF Chronic Disease Management support and education needs related to effective management of HF Financial Constraints.   Planned Interventions: Basic  overview and discussion of pathophysiology of Heart Failure reviewed Provided education on low sodium diet Reviewed Heart Failure Action Plan in depth and provided written copy Assessed need for readable accurate scales in home Provided education about placing scale on hard, flat surface Advised patient to weigh each morning after emptying bladder Discussed importance of daily weight and advised patient to weigh and record daily Reviewed role of diuretics in prevention of fluid overload and management of heart failure Discussed the importance of keeping all appointments with provider Provided patient with education about the role of exercise in the management of heart failure Referral made to community resources care guide team for assistance with food resources and meal delivery in her area Advised patient to discuss HF and changes in heart health with provider Screening for signs and symptoms of depression related to chronic disease state  Assessed social determinant of health barriers The patient has an appointment with cardiologist on 07-20-2022 The patient was discharged home on oxygen 2 liters via [place]. She only takes off when going to the bathroom. Review of safety and falls prevention. The patient uses a walker. The patient denies falls.   Symptom Management: Take medications as prescribed   Attend all scheduled provider appointments Call provider office for new concerns or questions  call the Suicide and Crisis Lifeline: 988 call the [place] National Suicide Prevention Lifeline: 367-421-2653 or TTY: 575 295 2481 TTY [phone]) to talk to a trained counselor call 1-800-273-TALK (toll free, 24 hour hotline) if experiencing a Mental Health or [place]  call office if I gain  more than 2 pounds in one day or 5 pounds in one week keep legs up while sitting use salt in moderation watch for swelling in feet, ankles and legs every day weigh myself daily develop a rescue  plan follow rescue plan if symptoms flare-up track symptoms and what helps feel better or worse dress right for the weather, hot or cold  Follow Up Plan: Telephone follow up appointment with care management team member scheduled for: 09-11-2022 at 230 pm       CCM Expected Outcome:  Monitor, Self-Manage, and Reduce Symptoms of Hypertension       Current Barriers:  Care Coordination needs related to resources for food delivery/resources  in a patient with HTN Chronic Disease Management support and education needs related to effective management of HTN  Planned Interventions: Evaluation of current treatment plan related to hypertension self management and patient's adherence to plan as established by provider;   Provided education to patient re: stroke prevention, s/s of heart attack and stroke; Reviewed prescribed diet heart healthy diet  Reviewed medications with patient and discussed importance of compliance;  Discussed plans with patient for ongoing care management follow up and provided patient with direct contact information for care management team; Advised patient, providing education and rationale, to monitor blood pressure daily and record, calling PCP for findings outside established parameters;  Reviewed scheduled/upcoming provider appointments including: 10-05-2022 at 0920 am with the pcp Advised patient to discuss changes in HTN or heart health with provider; Provided education on prescribed diet heart healthy. The patient states that she sometimes does not have and appetite, sometimes she does. She denies any issues with dehydration. She does say it is hard to prepare meals due to her and her husband both being "sick". She is receptive to talking to the care guides for assistance with resources in her area for food delivery of meals. Care guide referral pending ;  Discussed complications of poorly controlled blood pressure such as heart disease, stroke, circulatory complications,  vision complications, kidney impairment, sexual dysfunction;  Screening for signs and symptoms of depression related to chronic disease state;  Assessed social determinant of health barriers;   Symptom Management: Take medications as prescribed   Attend all scheduled provider appointments Call provider office for new concerns or questions  call the Suicide and Crisis Lifeline: 988 call the [place] National Suicide Prevention Lifeline: 903-761-8121 or TTY: 365 201 4408 TTY 802-130-9637) to talk to a trained counselor call 1-800-273-TALK (toll free, 24 hour hotline) if experiencing a Mental Health or [place]  check blood pressure weekly learn about high blood pressure develop an action plan for high blood pressure keep all doctor appointments take medications for blood pressure exactly as prescribed report new symptoms to your doctor  Follow Up Plan: Telephone follow up appointment with care management team member scheduled for: 09-11-2022 at 230 pm       CCM:  Maintain, Monitor and Self-Manage Symptoms of COPD       Current Barriers:  Chronic Disease Management support and education needs related to effective management of COPD  Planned Interventions: Provided patient with basic written and verbal COPD education on self care/management/and exacerbation prevention Advised patient to track and manage COPD triggers Provided written and verbal instructions on pursed lip breathing and utilized returned demonstration as teach back Provided instruction about proper use of medications used for management of COPD including inhalers Advised patient to self assesses COPD action plan zone and make appointment with provider if in the yellow  zone for 48 hours without improvement Provided education about and advised patient to utilize infection prevention strategies to reduce risk of respiratory infection Discussed the importance of adequate rest and management of fatigue with  COPD Referral made to community resources care guide team for assistance with food resources  Screening for signs and symptoms of depression related to chronic disease state  Assessed social determinant of health barriers Recent hospital stay from 06-24-2022 to 06-29-2022 for shortness of breath with diagnosis of new onset of HF. The patient in now Oxygen dependent on 2 liters via [place], only takes off when she goes to the bathroom. Review of safety and falls prevention  Symptom Management: Take medications as prescribed   Attend all scheduled provider appointments Call provider office for new concerns or questions  call the Suicide and Crisis Lifeline: 988 call the [place] National Suicide Prevention Lifeline: 303-131-8828 or TTY: (630) 175-4449 TTY 660-627-4489) to talk to a trained counselor call 1-800-273-TALK (toll free, 24 hour hotline) go to [facility] [address] [place] [phone]) if experiencing a [place] or [place]  avoid second hand smoke eliminate smoking in my home identify and remove indoor air pollutants limit outdoor activity during cold weather listen for public air quality announcements every day do breathing exercises every day develop a rescue plan eliminate symptom triggers at home follow rescue plan if symptoms flare-up  Follow Up Plan: Telephone follow up appointment with care management team member scheduled for: 09-11-2022 at 230 pm          The patient verbalized understanding of instructions, educational materials, and care plan provided today and agreed to receive a mailed copy of patient instructions, educational materials, and care plan.   Telephone follow up appointment with care management team member scheduled for: 09-11-2022 at 230 pm   Heart Failure Eating Plan Heart failure, also called congestive heart failure, occurs when your heart does not pump blood well enough to meet your  body's needs for oxygen-rich blood. Heart failure is a long-term (chronic) condition. Living with heart failure can be challenging. Following your health care provider's instructions about a healthy lifestyle and working with a dietitian to choose the right foods may help to improve your symptoms. An eating plan for someone with heart failure will include changes that limit the intake of salt (sodium) and unhealthy fat. What are tips for following this plan? Reading food labels Check food labels for the amount of sodium per serving. Choose foods that have less than 140 mg (milligrams) of sodium in each serving. Check food labels for the number of calories per serving. This is important if you need to limit your daily calorie intake to lose weight. Check food labels for the serving size. If you eat more than one serving, you will be eating more sodium and calories than what is listed on the label. Look for foods that are labeled as "sodium-free," "very low sodium," or "low sodium." Foods labeled as "reduced sodium" or "lightly salted" may still have more sodium than what is recommended for you. Cooking Avoid adding salt when cooking. Ask your health care provider or dietitian before using salt substitutes. Season food with salt-free seasonings, spices, or herbs. Check the label of seasoning mixes to make sure they do not contain salt. Cook with heart-healthy oils, such as olive, canola, soybean, or sunflower oil. Do not fry foods. Cook foods using low-fat methods, such as baking, boiling, grilling, and broiling. Limit unhealthy fats when cooking  by: Removing the skin from poultry, such as chicken. Removing all visible fats from meats. Skimming the fat off from stews, soups, and gravies before serving them. Meal planning  Limit your intake of: Processed, canned, or prepackaged foods. Foods that are high in trans fat, such as fried foods. Sweets, desserts, sugary drinks, and other foods with added  sugar. Full-fat dairy products, such as whole milk. Eat a balanced diet. This may include: 4-5 servings of fruit each day and 4-5 servings of vegetables each day. At each meal, try to fill one-half of your plate with fruits and vegetables. Up to 6-8 servings of whole grains each day. Up to 2 servings of lean meat, poultry, or fish each day. One serving of meat is equal to 3 oz (85 g). This is about the same size as a deck of cards. 2 servings of low-fat dairy each day. Heart-healthy fats. Healthy fats called omega-3 fatty acids are found in foods such as flaxseed and cold-water fish like sardines, salmon, and mackerel. Aim to eat 25-35 g (grams) of fiber a day. Foods that are high in fiber include apples, broccoli, carrots, beans, peas, and whole grains. Do not add salt or condiments that contain salt (such as soy sauce) to foods before eating. When eating at a restaurant, ask that your food be prepared with less salt or no salt, if possible. Try to eat 2 or more vegetarian meals each week. Eat more home-cooked food and eat less restaurant, buffet, and fast food. General information Do not eat more than 2,300 mg of sodium a day. The amount of sodium that is recommended for you may be lower, depending on your condition. Maintain a healthy body weight as directed. Ask your health care provider what a healthy weight is for you. Check your weight every day. Work with your health care provider and dietitian to make a plan that is right for you to lose weight or maintain your current weight. Limit how much fluid you drink. Ask your health care provider or dietitian how much fluid you can have each day. Limit or avoid alcohol as told by your health care provider or dietitian. Recommended foods Fruits All fresh, frozen, and canned fruits. Dried fruits, such as raisins, prunes, and cranberries. Vegetables All fresh vegetables. Vegetables that are frozen without sauce or added salt. Low-sodium or  sodium-free canned vegetables. Grains Bread with less than 80 mg of sodium per slice. Whole-wheat pasta, quinoa, and brown rice. Oats and oatmeal. Barley. [place]. Grits and cream of wheat. Whole-grain and whole-wheat cold cereal. Meats and other protein foods Lean cuts of meat. Skinless chicken and [place]. Fish with high omega-3 fatty acids, such as salmon, sardines, and other cold-water fishes. Eggs. Dried beans, peas, and edamame. Unsalted nuts and nut butters. Dairy Low-fat or nonfat (skim) milk and dried milk. Rice milk, soy milk, and almond milk. Low-fat or nonfat yogurt. Small amounts of reduced-sodium block cheese. Low-sodium cottage cheese. Fats and oils Olive, canola, soybean, flaxseed, avocado, or sunflower oil. Sweets and desserts Applesauce. Granola bars. Sugar-free pudding and gelatin. Frozen fruit bars. Seasoning and other foods Fresh and dried herbs. Lemon or lime juice. Vinegar. Low-sodium ketchup. Salt-free marinades, salad dressings, sauces, and seasonings. The items listed above may not be a complete list of foods and beverages you can eat. Contact a dietitian for more information. Foods to avoid Fruits Fruits that are dried with sodium-containing preservatives. Vegetables Canned vegetables. Frozen vegetables with sauce or seasonings. Creamed vegetables. [place] fries. Onion rings.  Pickled vegetables and sauerkraut. Grains Bread with more than 80 mg of sodium per slice. Hot or cold cereal with more than 140 mg sodium per serving. Salted pretzels and crackers. Prepackaged breadcrumbs. Bagels, croissants, and biscuits. Meats and other protein foods Ribs and chicken wings. Bacon, ham, pepperoni, bologna, salami, and packaged luncheon meats. Hot dogs, bratwurst, and sausage. Canned meat. Smoked meat and fish. Salted nuts and seeds. Dairy Whole milk, half-and-half, and cream. Buttermilk. Processed cheese, cheese spreads, and cheese curds. Regular cottage cheese. Feta cheese.  Shredded cheese. String cheese. Fats and oils Butter, lard, shortening, ghee, and bacon fat. Canned and packaged gravies. Seasoning and other foods Onion salt, garlic salt, table salt, and sea salt. Marinades. Regular salad dressings. Relishes, pickles, and olives. Meat flavorings and tenderizers, and bouillon cubes. Horseradish, ketchup, and mustard. Worcestershire sauce. Teriyaki sauce, soy sauce (including reduced sodium). Hot sauce and Tabasco sauce. Steak sauce, fish sauce, oyster sauce, and cocktail sauce. Taco seasonings. Barbecue sauce. Tartar sauce. The items listed above may not be a complete list of foods and beverages you should avoid. Contact a dietitian for more information. Summary A heart failure eating plan includes changes that limit your intake of sodium and unhealthy fat, and it may help you lose weight or maintain a healthy weight. Your health care provider may also recommend limiting how much fluid you drink. Most people with heart failure should eat no more than 2,300 mg of salt (sodium) a day. The amount of sodium that is recommended for you may be lower, depending on your condition. Contact your health care provider or dietitian before making any major changes to your diet. This information is not intended to replace advice given to you by your health care provider. Make sure you discuss any questions you have with your health care provider. Document Revised: 09/05/2021 Document Reviewed: 01/11/2020 Elsevier Patient Education  [place]. Heart Failure, Diagnosis  Heart failure means that your heart is not able to pump blood in the right way. This makes it hard for your body to work well. Heart failure is usually a long-term (chronic) condition. You must take good care of yourself and follow your treatment plan from your doctor. Different stages of heart failure have different treatment plans. The stages are: Stage A: At risk for heart failure. Stage B: Pre-heart  failure. Stage C: Symptomatic heart failure. Stage D: Advanced heart failure. What are the causes? High blood pressure. Buildup of cholesterol and fat in the arteries. Heart attack. This injures the heart muscle. Heart valves that do not open and close properly. Damage of the heart muscle. This is also called cardiomyopathy. Infection of the heart muscle. This is also called myocarditis. Lung disease. What increases the risk? Getting older. The risk of heart failure goes up as a person ages. Being overweight. Using tobacco or nicotine products. Abusing alcohol or drugs. Having taken medicines that can damage the heart. Having any of these conditions: Diabetes. Abnormal heart rhythms. Thyroid problems. Low blood counts (anemia). Having a family history of heart failure. What are the signs or symptoms? Shortness of breath. Coughing. Swelling of the feet, ankles, legs, or belly. Losing or gaining weight for no reason. Trouble breathing. Waking from sleep because of the need to sit up and get more air. Fast heartbeat. Other symptoms may include: Being very tired. Feeling dizzy, or feeling like you may pass out (faint). Having no desire to eat. Feeling like you may vomit (nauseous). Peeing (urinating) more at night. Feeling confused.  How is this treated? This condition may be treated with: Medicines. These can be given to treat blood pressure and to make the heart muscles stronger. Changes in your daily life. These may include: Eating a healthy diet. Staying at a healthy body weight. Quitting tobacco, alcohol, and drug use. Doing exercises. Participating in a cardiac rehabilitation program. This program helps you improve your health through exercise, education, and counseling. Surgery. Surgery can be done to open blocked valves or to put devices in the heart, such as pacemakers. A donor heart (heart transplant). You will receive a healthy heart from a donor. Follow these  instructions at home: Treat other conditions as told by your doctor. These may include high blood pressure, diabetes, thyroid disease, or abnormal heart rhythms. Learn as much as you can about heart failure. Get support as you need it. Keep all follow-up visits. Where to find more information American Heart Association: www.heart.org Centers for Disease Control and Prevention: http://www.wolf.info/ National Institute on Aging: http://kim-miller.com/ Summary Heart failure means that your heart is not able to pump blood in the right way. This condition is often caused by high blood pressure, heart attack, or damage of the heart muscle. Symptoms of this condition include shortness of breath and swelling of the feet, ankles, legs, or belly. You may also feel very tired or feel like you may vomit. You may be treated with medicines, surgery, or changes in your daily life. Treat other health conditions as told by your doctor. This information is not intended to replace advice given to you by your health care provider. Make sure you discuss any questions you have with your health care provider. Document Revised: 11/24/2020 Document Reviewed: 12/19/2019 Elsevier Patient Education  [place].  Heart Failure Action Plan A heart failure action plan helps you understand what to do when you have symptoms of heart failure. Your action plan is a color-coded plan that lists the symptoms to watch for and indicates what actions to take. If you have symptoms in the red zone, you need medical care right away. If you have symptoms in the yellow zone, you are having problems. If you have symptoms in the green zone, you are doing well. Follow the plan that was created by you and your health care provider. Review your plan each time you visit your health care provider. Red zone These signs and symptoms mean you should get medical help right away: You have trouble breathing when resting. You have a dry cough that is  getting worse. You have swelling or pain in your legs or abdomen that is getting worse. You suddenly gain more than 2-3 lb (0.9-1.4 kg) in 24 hours, or more than 5 lb (2.3 kg) in a week. This amount may be more or less depending on your condition. You have trouble staying awake or you feel confused. You have chest pain. You do not have an appetite. You pass out. You have worsening sadness or depression. If you have any of these symptoms, call your local emergency services (911 in the [place] right away. Do not drive yourself to the hospital. Yellow zone These signs and symptoms mean your condition may be getting worse and you should make some changes: You have trouble breathing when you are active, or you need to sleep with your head raised on extra pillows to help you breathe. You have swelling in your legs or abdomen. You gain 2-3 lb (0.9-1.4 kg) in 24 hours, or 5 lb (2.3 kg) in a week.  This amount may be more or less depending on your condition. You get tired easily. You have trouble sleeping. You have a dry cough. If you have any of these symptoms: Contact your health care provider within the next day. Your health care provider may adjust your medicines. Green zone These signs mean you are doing well and can continue what you are doing: You do not have shortness of breath. You have very little swelling or no new swelling. Your weight is stable (no gain or loss). You have a normal activity level. You do not have chest pain or any other new symptoms. Follow these instructions at home: Take over-the-counter and prescription medicines only as told by your health care provider. Weigh yourself daily. Your target weight is __________ lb (__________ kg). Call your health care provider if you gain more than __________ lb (__________ kg) in 24 hours, or more than __________ lb (__________ kg) in a week. Health care provider name: _____________________________________________________ Health  care provider phone number: _____________________________________________________ Eat a heart-healthy diet. Work with a diet and nutrition specialist (dietitian) to create an eating plan that is best for you. Keep all follow-up visits. This is important. Where to find more information American Heart Association: Summary A heart failure action plan helps you understand what to do when you have symptoms of heart failure. Follow the action plan that was created by you and your health care provider. Get help right away if you have any symptoms in the red zone. This information is not intended to replace advice given to you by your health care provider. Make sure you discuss any questions you have with your health care provider. Document Revised: 09/05/2021 Document Reviewed: 01/11/2020 Elsevier Patient Education  [place].

## 2022-07-18 ENCOUNTER — Telehealth: Payer: Self-pay | Admitting: *Deleted

## 2022-07-18 DIAGNOSIS — C672 Malignant neoplasm of lateral wall of bladder: Secondary | ICD-10-CM | POA: Diagnosis not present

## 2022-07-18 DIAGNOSIS — N179 Acute kidney failure, unspecified: Secondary | ICD-10-CM | POA: Diagnosis not present

## 2022-07-18 DIAGNOSIS — I132 Hypertensive heart and chronic kidney disease with heart failure and with stage 5 chronic kidney disease, or end stage renal disease: Secondary | ICD-10-CM | POA: Diagnosis not present

## 2022-07-18 DIAGNOSIS — N185 Chronic kidney disease, stage 5: Secondary | ICD-10-CM | POA: Diagnosis not present

## 2022-07-18 DIAGNOSIS — I5033 Acute on chronic diastolic (congestive) heart failure: Secondary | ICD-10-CM | POA: Diagnosis not present

## 2022-07-18 DIAGNOSIS — D631 Anemia in chronic kidney disease: Secondary | ICD-10-CM | POA: Diagnosis not present

## 2022-07-18 NOTE — Telephone Encounter (Signed)
   Telephone encounter was:  Unsuccessful.  07/18/2022 Name: IZAMAR LINDEN MRN: [id number] DOB: [birth year]  Unsuccessful outbound call made today to assist with:  Food Insecurity  Outreach Attempt:  1st Attempt  Home not inservice and the voicemail is full for the cell number  .[place] [phone] [address] , [place] [address] Email : Ashby Dawes. [email or web address]-moran [email or web address]

## 2022-07-19 DIAGNOSIS — I129 Hypertensive chronic kidney disease with stage 1 through stage 4 chronic kidney disease, or unspecified chronic kidney disease: Secondary | ICD-10-CM | POA: Diagnosis not present

## 2022-07-19 DIAGNOSIS — C672 Malignant neoplasm of lateral wall of bladder: Secondary | ICD-10-CM | POA: Diagnosis not present

## 2022-07-19 DIAGNOSIS — R600 Localized edema: Secondary | ICD-10-CM | POA: Diagnosis not present

## 2022-07-19 DIAGNOSIS — I132 Hypertensive heart and chronic kidney disease with heart failure and with stage 5 chronic kidney disease, or end stage renal disease: Secondary | ICD-10-CM | POA: Diagnosis not present

## 2022-07-19 DIAGNOSIS — N189 Chronic kidney disease, unspecified: Secondary | ICD-10-CM | POA: Diagnosis not present

## 2022-07-19 DIAGNOSIS — D631 Anemia in chronic kidney disease: Secondary | ICD-10-CM | POA: Diagnosis not present

## 2022-07-19 DIAGNOSIS — I5033 Acute on chronic diastolic (congestive) heart failure: Secondary | ICD-10-CM | POA: Diagnosis not present

## 2022-07-19 DIAGNOSIS — N185 Chronic kidney disease, stage 5: Secondary | ICD-10-CM | POA: Diagnosis not present

## 2022-07-19 DIAGNOSIS — E7800 Pure hypercholesterolemia, unspecified: Secondary | ICD-10-CM | POA: Diagnosis not present

## 2022-07-19 DIAGNOSIS — N390 Urinary tract infection, site not specified: Secondary | ICD-10-CM | POA: Diagnosis not present

## 2022-07-19 DIAGNOSIS — Z85528 Personal history of other malignant neoplasm of kidney: Secondary | ICD-10-CM | POA: Diagnosis not present

## 2022-07-19 DIAGNOSIS — N184 Chronic kidney disease, stage 4 (severe): Secondary | ICD-10-CM | POA: Diagnosis not present

## 2022-07-19 DIAGNOSIS — J90 Pleural effusion, not elsewhere classified: Secondary | ICD-10-CM | POA: Diagnosis not present

## 2022-07-19 DIAGNOSIS — N179 Acute kidney failure, unspecified: Secondary | ICD-10-CM | POA: Diagnosis not present

## 2022-07-19 NOTE — Telephone Encounter (Signed)
Patient home health nurse staci and stated that patient loss 6 more pounds patient has appointment tomorrow with cardiologist, she has had the patient to stop her torsemide until her appointment tomorrow. Order given to nurse to be able to communicate with cardiologist.

## 2022-07-19 NOTE — Progress Notes (Signed)
Cardiology Office Note:    Date:  07/20/2022   ID:  Alexandra [name], DOB 1940/10/09, MRN [id number]  PCP:  Rochel Brome, MD  Cardiologist:  Shirlee More, MD    Referring MD: Neil Crouch, FNP    ASSESSMENT:    1. Chronic diastolic heart failure ([place])   2. CKD (chronic kidney disease) stage 5, GFR less than 15 ml/min (HCC)   3. 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 Hypertensive heart disease with heart failure ([place])   5. Pericardial effusion   6. Bilateral pleural effusion   7. Malignant neoplasm of urinary bladder, unspecified site ([place])   8. H/O left nephrectomy    PLAN:    In order of problems listed above:  Is a very complex case.  She clearly had decompensated heart failure in the setting of stage V CKD chemotherapy in my memory is one of the agents was associated at times with pericardial effusion and pleural effusions improved with thoracentesis.  I think heart failure is compensated I would not put her on vasodilators or other agents with stage V CKD I think her care is becoming palliative Recheck an echocardiogram in 1 month if she developed clear coverage effusive constrictive physiology could benefit from pericardial window Severe CKD followed by nephrology   Next appointment: 3 months   Medication Adjustments/Labs and Tests Ordered: Current medicines are reviewed at length with the patient today.  Concerns regarding medicines are outlined above.  No orders of the defined types were placed in this encounter.  No orders of the defined types were placed in this encounter.   Chief complaint Hospital follow-up   History of Present Illness:    Alexandra [name] is a 82 y.o. female with a hx of hypertension last seen by my partner Dr. Geraldo Pitter 05/21/2019.  Compliance with diet, lifestyle and medications: Yes  She is seen with a caregiver today She was seen by [facility] yesterday told she has stage V CKD She  is not taking a diuretic and her weight is contributing to fall at home she came home at 160 and her weight is declined to 140 pounds Home blood pressures in the range of 100 110 She clearly is weak but feels better and she is not short of breath using nasal oxygen She has no edema orthopnea chest pain or syncope She is no longer receiving chemotherapy  She was recently admitted to [facility] 06/23/2018 and discharged 06/29/2022.  Admission diagnosis of congestive heart failure acute superimposed on chronic heart failure.  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  Previous admission to the hospital showed bilateral moderate pleural and small pericardial effusion.  She was then treated with IV diuretic was also felt to have COPD exacerbation she underwent thoracentesis 325 cc on the right 500 cc on the left with bilateral pleural effusions.  She was symptomatically improved following thoracentesis there is a discussion with the patient about dialysis and she declined.  Laboratory studies prior to discharge showed a hemoglobin of 9.6 creatinine 3.6 potassium 5.2 GFR 12 cc/min stage V CKD without renal replacement therapy for proBNP level not unexpectedly severely elevated 7560 troponin did not indicate acute coronary syndrome.  EKG 06/27/2022 showed sinus  rhythm low voltage it was read as inferior infarction but that was not present.  Her echocardiogram interpreted by me shows small posterior pericardial effusion there is some indication of effusive constrictive physiology with medial supine exceeding lateral.  Her ejection fraction was normal at 55 to 60%.  Right ventricular systolic pressure was normal however right atrial pressure  was elevated at 15 mmHg.  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 82 yo   BACK SURGERY  2016   lower L 4 to L 5   bladder tack and uterus lift  10/10/2015   CHOLECYSTECTOMY     summer of 2022   IR RADIOLOGIST EVAL & MGMT  12/03/2018   IR RADIOLOGIST EVAL & MGMT  12/31/2018   PARATHYROIDECTOMY Left 07/02/2016   Procedure: LEFT INFERIOR PARATHYROIDECTOMY;  Surgeon: Armandina Gemma, MD;  Location: [facility]S;  Service: General;  Laterality: Left;   SHOULDER OPEN ROTATOR CUFF REPAIR  12/2019   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    Current Medications: Current Meds  Medication Sig   atorvastatin (LIPITOR) 40 MG tablet Take 1 tablet (40 mg total) by mouth daily. Per ccm   budesonide (PULMICORT) 0.5 MG/2ML nebulizer solution Take 0.5 mg by nebulization 2 (two) times daily.   Cholecalciferol (VITAMIN D) 125 MCG (5000 UT) CAPS Take 1 capsule by mouth daily.   ipratropium (ATROVENT) 0.02 % nebulizer solution Take by nebulization 2 (two) times daily.   levalbuterol (XOPENEX) 1.25 MG/3ML nebulizer solution Take 1.25 mg by nebulization 2 (two) times daily.   metoprolol tartrate (LOPRESSOR) 25 MG tablet Take 25 mg by mouth 2 (two) times daily.   nitroGLYCERIN (NITROSTAT) 0.4 MG SL tablet Place 1 tablet (0.4 mg total) under the tongue every 5 (five) minutes as needed for chest pain.   ondansetron (ZOFRAN-ODT) 4 MG disintegrating tablet Take 4 mg by mouth every 8 (eight) hours as needed for nausea or vomiting.   pantoprazole (PROTONIX) 40 MG tablet Take 40 mg by mouth daily.   polyethylene glycol powder (GLYCOLAX/MIRALAX) 17 GM/SCOOP  powder Take 17 g by mouth daily.   rOPINIRole (REQUIP) 2 MG tablet TAKE 1 TABLET(2 MG) BY MOUTH IN THE MORNING AND AT BEDTIME   Sennosides-Docusate Sodium (SB DOCUSATE SODIUM-SENNA PO) Take 1 tablet by mouth in the morning and at bedtime.   sodium bicarbonate 650 MG tablet Take 650 mg by mouth 3 (three) times daily.   torsemide (DEMADEX) 20 MG tablet Take 20 mg by mouth daily as needed (edema).   VENTOLIN HFA 108 (90 Base) MCG/ACT inhaler      Allergies:   Contrast media [iodinated contrast media], Omeprazole, Shellfish allergy, Shellfish-derived products, and Zofran Alvis Lemmings hcl]   Social History   Socioeconomic History   Marital status: Married    Spouse name: Herbie [place]   Number of children: 2   Years of education: 11   Highest education level: 11th grade  Occupational History   Occupation: Retired  Tobacco Use   Smoking status: Former    Packs/day: 1.00    Years: 50.00    Total pack years: 50.00    Types:  Cigarettes    Quit date: 06/11/2005    Years since quitting: 17.1   Smokeless tobacco: Never  Vaping Use   Vaping Use: Never used  Substance and Sexual Activity   Alcohol use: No   Drug use: No   Sexual activity: Yes    Partners: Male  Other Topics Concern   Not on file  Social History Narrative   Not on file   Social Determinants of Health   Financial Resource Strain: Medium Risk (05/01/2022)   Overall Financial Resource Strain (CARDIA)    Difficulty of Paying Living Expenses: Somewhat hard  Food Insecurity: No Food Insecurity (07/02/2022)   Hunger Vital Sign    Worried About Running Out of Food in the Last Year: Never true    Ran Out of Food in the Last Year: Never true  Transportation Needs: No Transportation Needs (07/02/2022)   PRAPARE - Hydrologist (Medical): No    Lack of Transportation (Non-Medical): No  Physical Activity: Inactive (02/15/2022)   Exercise Vital Sign    Days of Exercise per Week: 0 days    Minutes of Exercise  per Session: 0 min  Stress: No Stress Concern Present (02/15/2022)   [place]    Feeling of Stress : Only a little  Social Connections: Moderately Integrated (02/15/2022)   Social Connection and Isolation Panel [NHANES]    Frequency of Communication with Friends and Family: More than three times a week    Frequency of Social Gatherings with Friends and Family: More than three times a week    Attends Religious Services: More than 4 times per year    Active Member of [employer] or Organizations: No    Attends Music therapist: Never    Marital Status: Married     Family History: The patient's family history includes COPD in her sister; Emphysema in her mother; Hyperlipidemia in her brother, sister, and sister; Hypertension in her brother, sister, and sister; Lymphoma in her sister; Obesity in her brother. There is no history of Breast cancer. ROS:   Please see the history of present illness.    All other systems reviewed and are negative.  EKGs/Labs/Other Studies Reviewed:    The following studies were reviewed today:  EKG: Was not repeated today  Recent Labs: 03/27/2022: NT-Pro BNP 1,313 07/03/2022: ALT 32; BUN 110; Creatinine, Ser 3.58; Hemoglobin 10.2; Platelets 553; Potassium 5.6; Sodium 146  Recent Lipid Panel    Component Value Date/Time   CHOL 110 02/27/2022 0807   TRIG 77 02/27/2022 0807   HDL 44 02/27/2022 0807   CHOLHDL 2.5 02/27/2022 0807   LDLCALC 50 02/27/2022 0807    Physical Exam:    VS:  BP 90/60 (BP Location: Left Arm, Patient Position: Sitting, Cuff Size: Normal)   Pulse 96   Ht 5' 2"$  (1.575 m)   Wt 147 lb (66.7 kg)   SpO2 99%   BMI 26.89 kg/m     Wt Readings from Last 3 Encounters:  07/20/22 147 lb (66.7 kg)  07/03/22 166 lb (75.3 kg)  06/13/22 161 lb 1.6 oz (73.1 kg)     GEN: She looks frail well nourished, well developed in no acute distress HEENT: Normal NECK: No JVD; No  carotid bruits LYMPHATICS: No lymphadenopathy CARDIAC: Distant heart sounds RRR, no murmurs, rubs, gallops RESPIRATORY:  Clear to auscultation without rales, wheezing or rhonchi  ABDOMEN: Soft, non-tender, non-distended MUSCULOSKELETAL:  No edema; No deformity  SKIN: Warm and dry NEUROLOGIC:  Alert and oriented x 3 PSYCHIATRIC:  Normal affect   Seen with Doyne Keel chaperone   Signed, Shirlee More, MD  07/20/2022 8:48 AM    [place]

## 2022-07-20 ENCOUNTER — Ambulatory Visit: Payer: Medicare Other | Attending: Cardiology | Admitting: Cardiology

## 2022-07-20 ENCOUNTER — Encounter: Payer: Self-pay | Admitting: Cardiology

## 2022-07-20 VITALS — BP 90/60 | HR 96 | Ht 62.0 in | Wt 147.0 lb

## 2022-07-20 DIAGNOSIS — J90 Pleural effusion, not elsewhere classified: Secondary | ICD-10-CM | POA: Diagnosis not present

## 2022-07-20 DIAGNOSIS — Z905 Acquired absence of kidney: Secondary | ICD-10-CM

## 2022-07-20 DIAGNOSIS — K429 Umbilical hernia without obstruction or gangrene: Secondary | ICD-10-CM | POA: Diagnosis not present

## 2022-07-20 DIAGNOSIS — N3289 Other specified disorders of bladder: Secondary | ICD-10-CM | POA: Diagnosis not present

## 2022-07-20 DIAGNOSIS — I5032 Chronic diastolic (congestive) heart failure: Secondary | ICD-10-CM

## 2022-07-20 DIAGNOSIS — I110 Hypertensive heart disease with heart failure: Secondary | ICD-10-CM

## 2022-07-20 DIAGNOSIS — N185 Chronic kidney disease, stage 5: Secondary | ICD-10-CM | POA: Diagnosis not present

## 2022-07-20 DIAGNOSIS — N281 Cyst of kidney, acquired: Secondary | ICD-10-CM | POA: Diagnosis not present

## 2022-07-20 DIAGNOSIS — C679 Malignant neoplasm of bladder, unspecified: Secondary | ICD-10-CM

## 2022-07-20 DIAGNOSIS — D631 Anemia in chronic kidney disease: Secondary | ICD-10-CM

## 2022-07-20 DIAGNOSIS — C672 Malignant neoplasm of lateral wall of bladder: Secondary | ICD-10-CM | POA: Diagnosis not present

## 2022-07-20 DIAGNOSIS — I3139 Other pericardial effusion (noninflammatory): Secondary | ICD-10-CM

## 2022-07-20 DIAGNOSIS — Q632 Ectopic kidney: Secondary | ICD-10-CM | POA: Diagnosis not present

## 2022-07-20 NOTE — Addendum Note (Signed)
Addended by: Edwyna Shell I on: 07/20/2022 09:12 AM   Modules accepted: Orders

## 2022-07-20 NOTE — Patient Instructions (Signed)
Medication Instructions:  Your physician recommends that you continue on your current medications as directed. Please refer to the Current Medication list given to you today.  *If you need a refill on your cardiac medications before your next appointment, please call your pharmacy*   Lab Work: None If you have labs (blood work) drawn today and your tests are completely normal, you will receive your results only by: [place] (if you have MyChart) OR A paper copy in the mail If you have any lab test that is abnormal or we need to change your treatment, we will call you to review the results.   Testing/Procedures: Your physician has requested that you have an echocardiogram. Echocardiography is a painless test that uses sound waves to create images of your heart. It provides your doctor with information about the size and shape of your heart and how well your heart's chambers and valves are working. This procedure takes approximately one hour. There are no restrictions for this procedure. Please do NOT wear cologne, perfume, aftershave, or lotions (deodorant is allowed). Please arrive 15 minutes prior to your appointment time.    Follow-Up: At [facility], you and your health needs are our priority.  As part of our continuing mission to provide you with exceptional heart care, we have created designated Provider Care Teams.  These Care Teams include your primary Cardiologist (physician) and Advanced Practice Providers (APPs -  Physician Assistants and Nurse Practitioners) who all work together to provide you with the care you need, when you need it.  We recommend signing up for the patient portal called "MyChart".  Sign up information is provided on this After Visit Summary.  MyChart is used to connect with patients for Virtual Visits (Telemedicine).  Patients are able to view lab/test results, encounter notes, upcoming appointments, etc.  Non-urgent messages can be sent to your  provider as well.   To learn more about what you can do with MyChart, go to [email or web address].    Your next appointment:   3 month(s)  Provider:   Shirlee More, MD    Other Instructions None  This visit was accompanied by Darius Bump.

## 2022-07-20 NOTE — Progress Notes (Signed)
  Care Coordination   Note   07/20/2022 Name: Alexandra [name] MRN: [id number] DOB: [birth year]  Alexandra [name] is a 82 y.o. year old female who sees Cox, Elnita Maxwell, MD for primary care. I reached out to [name] by phone today to offer care coordination services.  [name] was given information about Care Coordination services today including:   The Care Coordination services include support from the care team which includes your Nurse Coordinator, Clinical Social Worker, or Pharmacist.  The Care Coordination team is here to help remove barriers to the health concerns and goals most important to you. Care Coordination services are voluntary, and the patient may decline or stop services at any time by request to their care team member.   Care Coordination Consent Status: Patient agreed to services and verbal consent obtained.   Follow up plan:  Telephone appointment with care coordination team member scheduled for:  07/25/2022  Encounter Outcome:  Pt. Scheduled   Julian Hy, [place] Direct Dial: [phone]

## 2022-07-20 NOTE — Progress Notes (Signed)
This visit was accompanied by Darius Bump, CMA.

## 2022-07-22 DIAGNOSIS — N179 Acute kidney failure, unspecified: Secondary | ICD-10-CM | POA: Diagnosis not present

## 2022-07-22 DIAGNOSIS — G2581 Restless legs syndrome: Secondary | ICD-10-CM | POA: Diagnosis not present

## 2022-07-22 DIAGNOSIS — R509 Fever, unspecified: Secondary | ICD-10-CM | POA: Diagnosis not present

## 2022-07-22 DIAGNOSIS — N170 Acute kidney failure with tubular necrosis: Secondary | ICD-10-CM | POA: Diagnosis not present

## 2022-07-22 DIAGNOSIS — M109 Gout, unspecified: Secondary | ICD-10-CM | POA: Diagnosis not present

## 2022-07-22 DIAGNOSIS — I132 Hypertensive heart and chronic kidney disease with heart failure and with stage 5 chronic kidney disease, or end stage renal disease: Secondary | ICD-10-CM | POA: Diagnosis not present

## 2022-07-22 DIAGNOSIS — N281 Cyst of kidney, acquired: Secondary | ICD-10-CM | POA: Diagnosis not present

## 2022-07-22 DIAGNOSIS — Z923 Personal history of irradiation: Secondary | ICD-10-CM | POA: Diagnosis not present

## 2022-07-22 DIAGNOSIS — Z8551 Personal history of malignant neoplasm of bladder: Secondary | ICD-10-CM | POA: Diagnosis not present

## 2022-07-22 DIAGNOSIS — A419 Sepsis, unspecified organism: Secondary | ICD-10-CM | POA: Diagnosis not present

## 2022-07-22 DIAGNOSIS — D509 Iron deficiency anemia, unspecified: Secondary | ICD-10-CM | POA: Diagnosis not present

## 2022-07-22 DIAGNOSIS — K219 Gastro-esophageal reflux disease without esophagitis: Secondary | ICD-10-CM | POA: Diagnosis not present

## 2022-07-22 DIAGNOSIS — Z905 Acquired absence of kidney: Secondary | ICD-10-CM | POA: Diagnosis not present

## 2022-07-22 DIAGNOSIS — D631 Anemia in chronic kidney disease: Secondary | ICD-10-CM | POA: Diagnosis not present

## 2022-07-22 DIAGNOSIS — R0602 Shortness of breath: Secondary | ICD-10-CM | POA: Diagnosis not present

## 2022-07-22 DIAGNOSIS — J9811 Atelectasis: Secondary | ICD-10-CM | POA: Diagnosis not present

## 2022-07-22 DIAGNOSIS — N185 Chronic kidney disease, stage 5: Secondary | ICD-10-CM | POA: Diagnosis not present

## 2022-07-22 DIAGNOSIS — E871 Hypo-osmolality and hyponatremia: Secondary | ICD-10-CM | POA: Diagnosis not present

## 2022-07-22 DIAGNOSIS — J441 Chronic obstructive pulmonary disease with (acute) exacerbation: Secondary | ICD-10-CM | POA: Diagnosis not present

## 2022-07-22 DIAGNOSIS — M1990 Unspecified osteoarthritis, unspecified site: Secondary | ICD-10-CM | POA: Diagnosis not present

## 2022-07-22 DIAGNOSIS — R000 Tachycardia, unspecified: Secondary | ICD-10-CM | POA: Diagnosis not present

## 2022-07-22 DIAGNOSIS — Z9981 Dependence on supplemental oxygen: Secondary | ICD-10-CM | POA: Diagnosis not present

## 2022-07-22 DIAGNOSIS — R0902 Hypoxemia: Secondary | ICD-10-CM | POA: Diagnosis not present

## 2022-07-22 DIAGNOSIS — N390 Urinary tract infection, site not specified: Secondary | ICD-10-CM | POA: Diagnosis not present

## 2022-07-22 DIAGNOSIS — J189 Pneumonia, unspecified organism: Secondary | ICD-10-CM | POA: Diagnosis not present

## 2022-07-22 DIAGNOSIS — I5032 Chronic diastolic (congestive) heart failure: Secondary | ICD-10-CM | POA: Diagnosis not present

## 2022-07-22 DIAGNOSIS — E875 Hyperkalemia: Secondary | ICD-10-CM | POA: Diagnosis not present

## 2022-07-22 DIAGNOSIS — J439 Emphysema, unspecified: Secondary | ICD-10-CM | POA: Diagnosis not present

## 2022-07-22 DIAGNOSIS — I5031 Acute diastolic (congestive) heart failure: Secondary | ICD-10-CM | POA: Diagnosis not present

## 2022-07-22 DIAGNOSIS — J9601 Acute respiratory failure with hypoxia: Secondary | ICD-10-CM | POA: Diagnosis not present

## 2022-07-22 DIAGNOSIS — R6520 Severe sepsis without septic shock: Secondary | ICD-10-CM | POA: Diagnosis not present

## 2022-07-22 DIAGNOSIS — E890 Postprocedural hypothyroidism: Secondary | ICD-10-CM | POA: Diagnosis not present

## 2022-07-22 DIAGNOSIS — N289 Disorder of kidney and ureter, unspecified: Secondary | ICD-10-CM | POA: Diagnosis not present

## 2022-07-22 DIAGNOSIS — I5033 Acute on chronic diastolic (congestive) heart failure: Secondary | ICD-10-CM | POA: Diagnosis not present

## 2022-07-22 DIAGNOSIS — J440 Chronic obstructive pulmonary disease with acute lower respiratory infection: Secondary | ICD-10-CM | POA: Diagnosis not present

## 2022-07-22 DIAGNOSIS — Z85528 Personal history of other malignant neoplasm of kidney: Secondary | ICD-10-CM | POA: Diagnosis not present

## 2022-07-22 DIAGNOSIS — E7800 Pure hypercholesterolemia, unspecified: Secondary | ICD-10-CM | POA: Diagnosis not present

## 2022-07-22 DIAGNOSIS — J449 Chronic obstructive pulmonary disease, unspecified: Secondary | ICD-10-CM | POA: Diagnosis not present

## 2022-07-23 DIAGNOSIS — I132 Hypertensive heart and chronic kidney disease with heart failure and with stage 5 chronic kidney disease, or end stage renal disease: Secondary | ICD-10-CM | POA: Diagnosis not present

## 2022-07-23 DIAGNOSIS — N185 Chronic kidney disease, stage 5: Secondary | ICD-10-CM | POA: Diagnosis not present

## 2022-07-23 DIAGNOSIS — I5033 Acute on chronic diastolic (congestive) heart failure: Secondary | ICD-10-CM | POA: Diagnosis not present

## 2022-07-23 DIAGNOSIS — N289 Disorder of kidney and ureter, unspecified: Secondary | ICD-10-CM | POA: Diagnosis not present

## 2022-07-23 DIAGNOSIS — D631 Anemia in chronic kidney disease: Secondary | ICD-10-CM | POA: Diagnosis not present

## 2022-07-23 DIAGNOSIS — N281 Cyst of kidney, acquired: Secondary | ICD-10-CM | POA: Diagnosis not present

## 2022-07-24 ENCOUNTER — Telehealth: Payer: Self-pay | Admitting: *Deleted

## 2022-07-24 NOTE — Telephone Encounter (Signed)
   Telephone encounter was:  Unsuccessful.  07/24/2022 Name: Alexandra [name] MRN: [id number] DOB: 02-09-1941  Unsuccessful outbound call made today to assist with:  Food Insecurity  Outreach Attempt:  2nd Attempt Voicemail is full  [place] [address] , [place] [address] Email : Ashby Dawes. [email or web address]-moran [email or web address]

## 2022-07-25 ENCOUNTER — Ambulatory Visit: Payer: Self-pay | Admitting: Licensed Clinical Social Worker

## 2022-07-25 DIAGNOSIS — D631 Anemia in chronic kidney disease: Secondary | ICD-10-CM | POA: Diagnosis not present

## 2022-07-25 DIAGNOSIS — I132 Hypertensive heart and chronic kidney disease with heart failure and with stage 5 chronic kidney disease, or end stage renal disease: Secondary | ICD-10-CM | POA: Diagnosis not present

## 2022-07-25 DIAGNOSIS — N185 Chronic kidney disease, stage 5: Secondary | ICD-10-CM | POA: Diagnosis not present

## 2022-07-25 DIAGNOSIS — C672 Malignant neoplasm of lateral wall of bladder: Secondary | ICD-10-CM | POA: Diagnosis not present

## 2022-07-25 DIAGNOSIS — I5033 Acute on chronic diastolic (congestive) heart failure: Secondary | ICD-10-CM | POA: Diagnosis not present

## 2022-07-25 DIAGNOSIS — N179 Acute kidney failure, unspecified: Secondary | ICD-10-CM | POA: Diagnosis not present

## 2022-07-25 NOTE — Patient Instructions (Signed)
Visit Information  Thank you for taking time to visit with me today. Please don't hesitate to contact me if I can be of assistance to you before our next scheduled telephone appointment.  Following are the goals we discussed today:    Our next appointment is by telephone on 09/04/22 at 9:30 AM   Please call the care guide team at [phone] if you need to cancel or reschedule your appointment.   If you are experiencing a Mental Health or [place] or need someone to talk to, please go to [facility] [place] [phone])   Following is a copy of your full plan of care:   Interventions: LCSW spoke today via phone with patient about patient needs Client and LCSW spoke of client ambulation. She uses a walker to help her walk. She said she has ramp at her home to help her go in and out of home Discussed appetite of client. She said she sometimes has reduced appetite Discussed family support. She said her son and daughter are supportive but live about 45 minutes . 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 Client said she has a friend who helps transport client to and from client appointments Discussed medication procurement.Discussed sleeping issues. She said she has difficulty sleeping Provided counseling support for client Discussed program support with RN, SW and Pharmacy Reviewed breathing challenges. She said she uses oxygen at 2 liters via nasal canula (continuous) to help with breathing LCSW to call client on 09/04/22 at 9:30 AM to discuss client needs at that time  Ms. Tedford was given information about Care Management services by the embedded care coordination team including:  Care Management services include personalized support from designated clinical staff supervised by her physician, including individualized plan of care and coordination with other care providers 24/7 contact phone numbers for assistance for urgent  and routine care needs. The patient may stop CCM services at any time (effective at the end of the month) by phone call to the office staff.  Patient agreed to services and verbal consent obtained.   Norva Riffle.Tieasha Larsen MSW, [place] Holiday representative [facility] Care Management [phone]

## 2022-07-25 NOTE — Patient Outreach (Signed)
  Care Coordination   Initial Visit Note   07/25/2022 Name: Alexandra [name] MRN: [id number] DOB: 1941/03/28  Alexandra [name] is a 82 y.o. year old female who sees Cox, Elnita Maxwell, MD for primary care. I spoke with  [name] by phone today.  What matters to the patients health and wellness today? Patient is trying to manage her medical needs and manage needs of her spouse    Goals Addressed             This Visit's Progress    Patient Stated she is trying to manage her medical needs and manage needs of her spouse       Interventions: LCSW spoke today via phone with patient about patient needs Client and LCSW spoke of client ambulation. She uses a walker to help her walk. She said she has ramp at her home to help her go in and out of home Discussed appetite of client. She said she sometimes has reduced appetite Discussed family support. She said her son and daughter are supportive but live about 45 minutes . 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 Client said she has a friend who helps transport client to and from client appointments Discussed medication procurement.Discussed sleeping issues. She said she has difficulty sleeping Provided counseling support for client Discussed program support with RN, SW and Pharmacy Reviewed breathing challenges. She said she uses oxygen at 2 liters via nasal canula (continuous) to help with breathing LCSW to call client on 09/04/22 at 9:30 AM to discuss client needs at that time     SDOH assessments and interventions completed:  Yes  SDOH Interventions Today    Flowsheet Row Most Recent Value  SDOH Interventions   Financial Strain Interventions Other (Comment)  [financial strain]  Physical Activity Interventions Other (Comments)  [uses a walker to help her walk]  Stress Interventions Provide Counseling  [client has stress in managing medical needs. Also she has stress in managing needs of her spouse]        Care Coordination Interventions:   Yes, provided   Interventions Today    Flowsheet Row Most Recent Value  Chronic Disease   Chronic disease during today's visit Other  [anxiety]  General Interventions   General Interventions Discussed/Reviewed General Interventions Discussed  [discussed program support]  Exercise Interventions   Exercise Discussed/Reviewed Physical Activity  [client said she uses a walker to help her walk]  Education Interventions   Education Provided Provided Education  Provided Verbal Education On Other  [discussed RN support, LCSW support and Pharmacy support with program]  Mental Health Interventions   Mental Health Discussed/Reviewed Anxiety  [client is anxious about managing medical needs]  Safety Interventions   Safety Discussed/Reviewed Home Safety  [client lives in double wide mobile home. she said she has a ramp she uses to go in and out of home]       Follow up plan: Follow up call scheduled for 09/04/22 at 9:30 AM    Encounter Outcome:  Pt. Visit Completed   Norva Riffle.Gabriela Irigoyen MSW, [place] Holiday representative [facility] Care Management [phone]

## 2022-07-26 ENCOUNTER — Other Ambulatory Visit: Payer: Self-pay | Admitting: Pharmacist

## 2022-07-26 ENCOUNTER — Telehealth: Payer: Self-pay | Admitting: *Deleted

## 2022-07-26 ENCOUNTER — Telehealth: Payer: Self-pay

## 2022-07-26 DIAGNOSIS — I132 Hypertensive heart and chronic kidney disease with heart failure and with stage 5 chronic kidney disease, or end stage renal disease: Secondary | ICD-10-CM | POA: Diagnosis not present

## 2022-07-26 DIAGNOSIS — N179 Acute kidney failure, unspecified: Secondary | ICD-10-CM | POA: Diagnosis not present

## 2022-07-26 DIAGNOSIS — D631 Anemia in chronic kidney disease: Secondary | ICD-10-CM | POA: Diagnosis not present

## 2022-07-26 DIAGNOSIS — N185 Chronic kidney disease, stage 5: Secondary | ICD-10-CM | POA: Diagnosis not present

## 2022-07-26 DIAGNOSIS — C672 Malignant neoplasm of lateral wall of bladder: Secondary | ICD-10-CM | POA: Diagnosis not present

## 2022-07-26 DIAGNOSIS — I5033 Acute on chronic diastolic (congestive) heart failure: Secondary | ICD-10-CM | POA: Diagnosis not present

## 2022-07-26 NOTE — Telephone Encounter (Signed)
   Telephone encounter was:  Unsuccessful.  07/26/2022 Name: Alexandra [name] MRN: [id number] DOB: 1941-05-19  Unsuccessful outbound call made today to assist with:  Food Insecurity  Outreach Attempt:  3rd Attempt.  Referral closed unable to contact patient.  A HIPAA compliant voice message was left requesting a return call.  Instructed patient to call back at [phone] .  [place] [phone] [address] , [place] [address] Email : Ashby Dawes. [place]-moran [email or web address]

## 2022-07-26 NOTE — Telephone Encounter (Signed)
POC approved for physical therapy and OCCUPATIONAL THERAPY.

## 2022-07-27 DIAGNOSIS — D631 Anemia in chronic kidney disease: Secondary | ICD-10-CM | POA: Diagnosis not present

## 2022-07-27 DIAGNOSIS — I5033 Acute on chronic diastolic (congestive) heart failure: Secondary | ICD-10-CM | POA: Diagnosis not present

## 2022-07-27 DIAGNOSIS — I132 Hypertensive heart and chronic kidney disease with heart failure and with stage 5 chronic kidney disease, or end stage renal disease: Secondary | ICD-10-CM | POA: Diagnosis not present

## 2022-07-27 DIAGNOSIS — N185 Chronic kidney disease, stage 5: Secondary | ICD-10-CM | POA: Diagnosis not present

## 2022-07-27 DIAGNOSIS — N179 Acute kidney failure, unspecified: Secondary | ICD-10-CM | POA: Diagnosis not present

## 2022-07-27 DIAGNOSIS — C672 Malignant neoplasm of lateral wall of bladder: Secondary | ICD-10-CM | POA: Diagnosis not present

## 2022-07-30 DIAGNOSIS — D631 Anemia in chronic kidney disease: Secondary | ICD-10-CM | POA: Diagnosis not present

## 2022-07-30 DIAGNOSIS — I5033 Acute on chronic diastolic (congestive) heart failure: Secondary | ICD-10-CM | POA: Diagnosis not present

## 2022-07-30 DIAGNOSIS — I132 Hypertensive heart and chronic kidney disease with heart failure and with stage 5 chronic kidney disease, or end stage renal disease: Secondary | ICD-10-CM | POA: Diagnosis not present

## 2022-07-30 DIAGNOSIS — N185 Chronic kidney disease, stage 5: Secondary | ICD-10-CM | POA: Diagnosis not present

## 2022-07-30 DIAGNOSIS — N179 Acute kidney failure, unspecified: Secondary | ICD-10-CM | POA: Diagnosis not present

## 2022-07-30 DIAGNOSIS — C672 Malignant neoplasm of lateral wall of bladder: Secondary | ICD-10-CM | POA: Diagnosis not present

## 2022-07-31 ENCOUNTER — Telehealth: Payer: Self-pay

## 2022-07-31 ENCOUNTER — Ambulatory Visit (INDEPENDENT_AMBULATORY_CARE_PROVIDER_SITE_OTHER): Payer: Medicare Other | Admitting: Family Medicine

## 2022-07-31 ENCOUNTER — Encounter: Payer: Self-pay | Admitting: Family Medicine

## 2022-07-31 VITALS — BP 104/58 | HR 105 | Temp 97.8°F | Ht 62.0 in

## 2022-07-31 DIAGNOSIS — N179 Acute kidney failure, unspecified: Secondary | ICD-10-CM | POA: Diagnosis not present

## 2022-07-31 DIAGNOSIS — J449 Chronic obstructive pulmonary disease, unspecified: Secondary | ICD-10-CM

## 2022-07-31 DIAGNOSIS — I132 Hypertensive heart and chronic kidney disease with heart failure and with stage 5 chronic kidney disease, or end stage renal disease: Secondary | ICD-10-CM | POA: Diagnosis not present

## 2022-07-31 DIAGNOSIS — C679 Malignant neoplasm of bladder, unspecified: Secondary | ICD-10-CM | POA: Diagnosis not present

## 2022-07-31 DIAGNOSIS — Z905 Acquired absence of kidney: Secondary | ICD-10-CM | POA: Diagnosis not present

## 2022-07-31 DIAGNOSIS — N1832 Chronic kidney disease, stage 3b: Secondary | ICD-10-CM | POA: Diagnosis not present

## 2022-07-31 DIAGNOSIS — C672 Malignant neoplasm of lateral wall of bladder: Secondary | ICD-10-CM | POA: Diagnosis not present

## 2022-07-31 DIAGNOSIS — D631 Anemia in chronic kidney disease: Secondary | ICD-10-CM | POA: Diagnosis not present

## 2022-07-31 DIAGNOSIS — J9611 Chronic respiratory failure with hypoxia: Secondary | ICD-10-CM | POA: Diagnosis not present

## 2022-07-31 DIAGNOSIS — D508 Other iron deficiency anemias: Secondary | ICD-10-CM

## 2022-07-31 DIAGNOSIS — N184 Chronic kidney disease, stage 4 (severe): Secondary | ICD-10-CM

## 2022-07-31 DIAGNOSIS — E875 Hyperkalemia: Secondary | ICD-10-CM | POA: Diagnosis not present

## 2022-07-31 DIAGNOSIS — Z9981 Dependence on supplemental oxygen: Secondary | ICD-10-CM

## 2022-07-31 DIAGNOSIS — J189 Pneumonia, unspecified organism: Secondary | ICD-10-CM | POA: Diagnosis not present

## 2022-07-31 DIAGNOSIS — E782 Mixed hyperlipidemia: Secondary | ICD-10-CM | POA: Diagnosis not present

## 2022-07-31 DIAGNOSIS — N185 Chronic kidney disease, stage 5: Secondary | ICD-10-CM | POA: Diagnosis not present

## 2022-07-31 DIAGNOSIS — I700 Atherosclerosis of aorta: Secondary | ICD-10-CM

## 2022-07-31 DIAGNOSIS — Z85528 Personal history of other malignant neoplasm of kidney: Secondary | ICD-10-CM | POA: Diagnosis not present

## 2022-07-31 DIAGNOSIS — I129 Hypertensive chronic kidney disease with stage 1 through stage 4 chronic kidney disease, or unspecified chronic kidney disease: Secondary | ICD-10-CM

## 2022-07-31 DIAGNOSIS — I5033 Acute on chronic diastolic (congestive) heart failure: Secondary | ICD-10-CM | POA: Diagnosis not present

## 2022-07-31 MED ORDER — VENTOLIN HFA 108 (90 BASE) MCG/ACT IN AERS: 1.0000 | INHALATION_SPRAY | Freq: Four times a day (QID) | RESPIRATORY_TRACT | 2 refills | Status: DC | PRN

## 2022-07-31 MED ORDER — ONDANSETRON 4 MG PO TBDP: 4.0000 mg | ORAL_TABLET | Freq: Three times a day (TID) | ORAL | 0 refills | Status: DC | PRN

## 2022-07-31 MED ORDER — BREZTRI AEROSPHERE 160-9-4.8 MCG/ACT IN AERO: 2.0000 | INHALATION_SPRAY | Freq: Two times a day (BID) | RESPIRATORY_TRACT | 4 refills | Status: DC

## 2022-07-31 NOTE — Telephone Encounter (Signed)
Verbal was given for OCCUPATIONAL THERAPY once a week for 5 weeks.

## 2022-07-31 NOTE — Progress Notes (Unsigned)
Subjective:  Patient ID: Alexandra [name], female    DOB: [birth year]  Age: 82 y.o. MRN: [id number]  Chief Complaint  Patient presents with   Hospitalization Follow-up    HPI  Follow up Hospitalization  Patient was admitted to [facility] on 07/22/2022 and discharged on 07/24/2022. She was treated for UTI/Pneumonia. Treatment for this included Antibiotics. Hospital course: Patient is a 82 year old female with past medical history of renal cell carcinoma status post left nephrectomy, bladder cancer status post chemo and radiation therapy, anemia of chronic disease, COPD, chronic respiratory failure with hypoxia on 2 L nasal cannula, CHF with preserved ejection fraction, chronic kidney disease stage IV and hypothyroidism who presented to the emergency department following recent hospitalization with fever, chills, shortness of breath, fever to 102, 84% saturations on room air and diagnosed with pneumonia and a urinary tract infection. Chest CT: 1.  Diffuse bilateral upper lobe predominantly groundglass density concerning for pulmonary edema but possibly pneumonia. 2.  Partially visualized 12 mm high attenuating lesion in right renal upper pole.  Recommended ultrasound not emergently. 3.  Aortic atherosclerosis and emphysema.  Treatment during hospitalization included IV Solu-Medrol x 1 in the emergency department, IV Zosyn, oxygen initially at 4 L per nasal cannula decreased to 2 L per nasal cannula.  Home physical therapy was recommended. Patient also experienced hyponatremia and hyperkalemia during her hospitalization.  Treated with Lokelma, sodium bicarb.  Torsemide was recently discontinued by her nephrologist and they continued this as needed. Anemia worsened during her admission.  Her admitting hemoglobin was 9 and decreased to 7.  She was transfused 1 unit packed red blood cells.  Her iron profile was consistent with deficiency and she was prescribed oral iron.  No repeat blood count  was done prior to her discharge.  She has been feeling fairly well. She lives with her husband.       05/30/2022   10:19 AM 04/10/2022    7:55 AM 02/15/2022    1:31 PM 12/26/2021    8:46 AM 11/23/2021    7:31 AM  Depression screen PHQ 2/9  Decreased Interest 0 0 0 0 0  Down, Depressed, Hopeless 0 0 0 0 0  PHQ - 2 Score 0 0 0 0 0  Altered sleeping 0 0  0   Tired, decreased energy 0 0  0   Change in appetite 0 0  0   Feeling bad or failure about yourself  0 0  0   Trouble concentrating 0 0  0   Moving slowly or fidgety/restless 0 0  0   Suicidal thoughts 0 0  0   PHQ-9 Score 0 0  0   Difficult doing work/chores Not difficult at all Not difficult at all  Not difficult at all          06/08/2022    9:06 AM 06/12/2022    9:20 AM 06/13/2022   10:35 AM 06/14/2022    9:53 AM 07/17/2022    3:40 PM  Fall Risk  Falls in the past year?     0  Was there an injury with Fall?     0  Fall Risk Category Calculator     0  (RETIRED) Patient Fall Risk Level Low fall risk Low fall risk Low fall risk High fall risk   Patient at Risk for Falls Due to     Impaired mobility  Fall risk Follow up     Falls evaluation completed;Education provided;Falls prevention discussed  Review of Systems  Constitutional:  Negative for appetite change, fatigue and fever.  HENT:  Negative for congestion, ear pain, sinus pressure and sore throat.   Respiratory:  Negative for cough, chest tightness, shortness of breath and wheezing.   Cardiovascular:  Negative for chest pain and palpitations.  Gastrointestinal:  Positive for diarrhea, nausea and vomiting. Negative for abdominal pain and constipation.  Genitourinary:  Negative for dysuria and hematuria.  Musculoskeletal:  Positive for back pain (Right sided back pain- kidney area). Negative for arthralgias, joint swelling and myalgias.  Skin:  Negative for rash.  Neurological:  Negative for dizziness, weakness and headaches.  Psychiatric/Behavioral:  Negative for  dysphoric mood. The patient is not nervous/anxious.     No current facility-administered medications on file prior to visit.   Current Outpatient Medications on File Prior to Visit  Medication Sig Dispense Refill   atorvastatin (LIPITOR) 40 MG tablet Take 1 tablet (40 mg total) by mouth daily. Per ccm 90 tablet 3   budesonide (PULMICORT) 0.5 MG/2ML nebulizer solution Take 0.5 mg by nebulization 2 (two) times daily.     Cholecalciferol (VITAMIN D) 125 MCG (5000 UT) CAPS Take 1 capsule by mouth daily.     ipratropium (ATROVENT) 0.02 % nebulizer solution Take 0.5 mg by nebulization 2 (two) times daily.     levalbuterol (XOPENEX) 1.25 MG/3ML nebulizer solution Take 1.25 mg by nebulization 2 (two) times daily.     nitroGLYCERIN (NITROSTAT) 0.4 MG SL tablet Place 1 tablet (0.4 mg total) under the tongue every 5 (five) minutes as needed for chest pain. 30 tablet 3   polyethylene glycol powder (GLYCOLAX/MIRALAX) 17 GM/SCOOP powder Take 17 g by mouth daily as needed for mild constipation.     rOPINIRole (REQUIP) 2 MG tablet TAKE 1 TABLET(2 MG) BY MOUTH IN THE MORNING AND AT BEDTIME (Patient taking differently: Take 2 mg by mouth 2 (two) times daily.) 180 tablet 6   torsemide (DEMADEX) 20 MG tablet Take 20 mg by mouth daily as needed (edema).     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 82 yo   BACK SURGERY  2016   lower L 4 to L 5   bladder tack and uterus lift  10/10/2015   CHOLECYSTECTOMY     summer of 2022   CYSTOSCOPY WITH RETROGRADE PYELOGRAM, URETEROSCOPY AND STENT PLACEMENT Right  08/04/2022   Procedure: CYSTOSCOPY WITH RIGHT URETERAL STENT EXCHANGE;  Surgeon: Vira Agar, MD;  Location: [facility];  Service: Urology;  Laterality: Right;   IR RADIOLOGIST EVAL & MGMT  12/03/2018   IR RADIOLOGIST EVAL & MGMT  12/31/2018   PARATHYROIDECTOMY Left 07/02/2016   Procedure: LEFT INFERIOR PARATHYROIDECTOMY;  Surgeon: Armandina Gemma, MD;  Location: [facility];  Service: General;  Laterality: Left;   SHOULDER OPEN ROTATOR CUFF REPAIR  12/2019   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    Family History  Problem Relation Age of Onset   Emphysema Mother    Hyperlipidemia Sister    Hypertension Sister    COPD Sister  Hyperlipidemia Sister    Hypertension Sister    Lymphoma Sister    Obesity Brother    Hyperlipidemia Brother    Hypertension Brother    Breast cancer Neg Hx    Social History   Socioeconomic History   Marital status: Married    Spouse name: Herbie [place]   Number of children: 2   Years of education: 11   Highest education level: 11th grade  Occupational History   Occupation: Retired  Tobacco Use   Smoking status: Former    Packs/day: 1.00    Years: 50.00    Total pack years: 50.00    Types: Cigarettes    Quit date: 06/11/2005    Years since quitting: 17.1   Smokeless tobacco: Never  Vaping Use   Vaping Use: Never used  Substance and Sexual Activity   Alcohol use: No   Drug use: No   Sexual activity: Yes    Partners: Male  Other Topics Concern   Not on file  Social History Narrative   Not on file   Social Determinants of Health   Financial Resource Strain: Medium Risk (07/25/2022)   Overall Financial Resource Strain (CARDIA)    Difficulty of Paying Living Expenses: Somewhat hard  Food Insecurity: No Food Insecurity (08/04/2022)   Hunger Vital Sign    Worried About Running Out of Food in the Last Year: Never true    Neche in the Last Year: Never true  Transportation Needs: No Transportation Needs (08/04/2022)   PRAPARE - Armed forces logistics/support/administrative officer (Medical): No    Lack of Transportation (Non-Medical): No  Physical Activity: Inactive (07/25/2022)   Exercise Vital Sign    Days of Exercise per Week: 0 days    Minutes of Exercise per Session: 0 min  Stress: Stress Concern Present (07/25/2022)   [place]    Feeling of Stress : To some extent  Social Connections: Moderately Integrated (02/15/2022)   Social Connection and Isolation Panel [NHANES]    Frequency of Communication with Friends and Family: More than three times a week    Frequency of Social Gatherings with Friends and Family: More than three times a week    Attends Religious Services: More than 4 times per year    Active Member of [employer] or Organizations: No    Attends Music therapist: Never    Marital Status: Married    Objective:  BP (!) 104/58 (BP Location: Left Arm, Patient Position: Sitting)   Pulse (!) 105   Temp 97.8 F (36.6 C) (Temporal)   Ht '5\' 2"'$  (1.575 m)   SpO2 98%   BMI 26.89 kg/m      08/05/2022    3:12 PM 08/05/2022    2:00 PM 08/05/2022    1:00 PM  BP/Weight  Systolic BP [id number] 96  Diastolic BP 58 64 62    Physical Exam Vitals reviewed.  Constitutional:      Appearance: Normal appearance. She is normal weight.  Neck:     Vascular: No carotid bruit.  Cardiovascular:     Rate and Rhythm: Normal rate and regular rhythm.     Pulses: Normal pulses.     Heart sounds: Normal heart sounds.  Pulmonary:     Effort: Pulmonary effort is normal.     Breath sounds: Normal breath sounds.     Comments: On 2 L oxygen PNC. Abdominal:     General: Abdomen  is flat. Bowel sounds are normal.     Palpations: Abdomen is soft.     Tenderness: There is no abdominal tenderness.  Musculoskeletal:     Lumbar back: No tenderness.  Neurological:     Mental Status: She is alert and oriented to person, place, and time.  Psychiatric:        Mood and Affect:  Mood normal.        Behavior: Behavior normal.     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  Check cmp.   Orders: -     Comprehensive metabolic panel -     Ondansetron; Take 1 tablet (4 mg total) by mouth every 8 (eight) hours as needed for nausea or vomiting.  Dispense: 20 tablet; Refill: 0 -     Immature Cells  Mixed hyperlipidemia Assessment & Plan: Well controlled.  No changes to medicines. Continue atorvastatin 40 mg before bed.  Continue to work on eating a healthy diet and exercise.  Labs drawn today.     COPD, moderate ([place]) Assessment & Plan: The current medical regimen is effective;  continue present plan and medications. Continue breztri 2 puffs twice daily.    Orders: -     Ventolin HFA; Inhale 1-2 puffs into the lungs every 6 (six) hours as needed for wheezing or shortness of breath.  Dispense: 18 g; Refill: 2 -     Breztri Aerosphere; Inhale 2 puffs into the lungs in the morning and at bedtime.  Dispense: 10.7 g; Refill: 4  History of kidney cancer Assessment & Plan: Follows with urology.   Malignant neoplasm of urinary bladder, unspecified site [facility]) Assessment & Plan: Follow with urology.    H/O left nephrectomy  Hyperkalemia Assessment & Plan: Check cmp.  Put on kayexylate 15 mg four times a day x 3  days and returning on Friday for stat potassium.    Pneumonia of both upper lobes due to infectious organism Assessment & Plan: Completed antibiotics.    Other iron deficiency anemia Assessment & Plan: Check cbc.   Orders: -     CBC with Differential/Platelet  Chronic respiratory failure with hypoxia (HCC) Assessment & Plan: Continue oxygen 2 L daily.    On home oxygen therapy     Meds ordered this encounter  Medications   VENTOLIN HFA 108 (90 Base) MCG/ACT inhaler    Sig: Inhale 1-2 puffs into the lungs every 6 (six) hours as needed for wheezing or shortness of breath.    Dispense:  18 g    Refill:  2   ondansetron (ZOFRAN-ODT) 4 MG disintegrating tablet    Sig: Take 1 tablet (4 mg total)  by mouth every 8 (eight) hours as needed for nausea or vomiting.    Dispense:  20 tablet    Refill:  0   Budeson-Glycopyrrol-Formoterol (BREZTRI AEROSPHERE) 160-9-4.8 MCG/ACT AERO    Sig: Inhale 2 puffs into the lungs in the morning and at bedtime.    Dispense:  10.7 g    Refill:  4    Orders Placed This Encounter  Procedures   CBC with Differential/Platelet   Comprehensive metabolic panel   Immature Cells     Follow-up: No follow-ups on file.  I attest that I have reviewed this visit and agree with the plan scribed by my staff.   Rochel Brome, MD [facility] [phone]

## 2022-08-01 ENCOUNTER — Telehealth: Payer: Self-pay

## 2022-08-01 DIAGNOSIS — A419 Sepsis, unspecified organism: Secondary | ICD-10-CM | POA: Diagnosis not present

## 2022-08-01 DIAGNOSIS — N185 Chronic kidney disease, stage 5: Secondary | ICD-10-CM | POA: Diagnosis not present

## 2022-08-01 DIAGNOSIS — M109 Gout, unspecified: Secondary | ICD-10-CM | POA: Diagnosis not present

## 2022-08-01 DIAGNOSIS — J441 Chronic obstructive pulmonary disease with (acute) exacerbation: Secondary | ICD-10-CM | POA: Diagnosis not present

## 2022-08-01 DIAGNOSIS — N179 Acute kidney failure, unspecified: Secondary | ICD-10-CM | POA: Diagnosis not present

## 2022-08-01 DIAGNOSIS — G2581 Restless legs syndrome: Secondary | ICD-10-CM | POA: Diagnosis not present

## 2022-08-01 DIAGNOSIS — J440 Chronic obstructive pulmonary disease with acute lower respiratory infection: Secondary | ICD-10-CM | POA: Diagnosis not present

## 2022-08-01 DIAGNOSIS — E782 Mixed hyperlipidemia: Secondary | ICD-10-CM | POA: Diagnosis not present

## 2022-08-01 DIAGNOSIS — M1990 Unspecified osteoarthritis, unspecified site: Secondary | ICD-10-CM | POA: Diagnosis not present

## 2022-08-01 DIAGNOSIS — J9621 Acute and chronic respiratory failure with hypoxia: Secondary | ICD-10-CM | POA: Diagnosis not present

## 2022-08-01 DIAGNOSIS — E039 Hypothyroidism, unspecified: Secondary | ICD-10-CM | POA: Diagnosis not present

## 2022-08-01 DIAGNOSIS — M810 Age-related osteoporosis without current pathological fracture: Secondary | ICD-10-CM | POA: Diagnosis not present

## 2022-08-01 DIAGNOSIS — J189 Pneumonia, unspecified organism: Secondary | ICD-10-CM | POA: Diagnosis not present

## 2022-08-01 DIAGNOSIS — C672 Malignant neoplasm of lateral wall of bladder: Secondary | ICD-10-CM | POA: Diagnosis not present

## 2022-08-01 DIAGNOSIS — R6520 Severe sepsis without septic shock: Secondary | ICD-10-CM | POA: Diagnosis not present

## 2022-08-01 DIAGNOSIS — I132 Hypertensive heart and chronic kidney disease with heart failure and with stage 5 chronic kidney disease, or end stage renal disease: Secondary | ICD-10-CM | POA: Diagnosis not present

## 2022-08-01 DIAGNOSIS — I700 Atherosclerosis of aorta: Secondary | ICD-10-CM | POA: Diagnosis not present

## 2022-08-01 DIAGNOSIS — D72829 Elevated white blood cell count, unspecified: Secondary | ICD-10-CM | POA: Diagnosis not present

## 2022-08-01 DIAGNOSIS — I5033 Acute on chronic diastolic (congestive) heart failure: Secondary | ICD-10-CM | POA: Diagnosis not present

## 2022-08-01 DIAGNOSIS — J439 Emphysema, unspecified: Secondary | ICD-10-CM | POA: Diagnosis not present

## 2022-08-01 DIAGNOSIS — E875 Hyperkalemia: Secondary | ICD-10-CM

## 2022-08-01 DIAGNOSIS — E213 Hyperparathyroidism, unspecified: Secondary | ICD-10-CM | POA: Diagnosis not present

## 2022-08-01 DIAGNOSIS — J4489 Other specified chronic obstructive pulmonary disease: Secondary | ICD-10-CM | POA: Diagnosis not present

## 2022-08-01 DIAGNOSIS — K219 Gastro-esophageal reflux disease without esophagitis: Secondary | ICD-10-CM | POA: Diagnosis not present

## 2022-08-01 DIAGNOSIS — G4700 Insomnia, unspecified: Secondary | ICD-10-CM | POA: Diagnosis not present

## 2022-08-01 DIAGNOSIS — D631 Anemia in chronic kidney disease: Secondary | ICD-10-CM | POA: Diagnosis not present

## 2022-08-01 LAB — COMPREHENSIVE METABOLIC PANEL
ALT: 19 IU/L (ref 0–32)
AST: 18 IU/L (ref 0–40)
Albumin/Globulin Ratio: 1.3 (ref 1.2–2.2)
Albumin: 3.5 g/dL — ABNORMAL LOW (ref 3.8–4.8)
Alkaline Phosphatase: 99 IU/L (ref 44–121)
BUN/Creatinine Ratio: 15 (ref 12–28)
BUN: 56 mg/dL — ABNORMAL HIGH (ref 8–27)
Bilirubin Total: 0.3 mg/dL (ref 0.0–1.2)
CO2: 18 mmol/L — ABNORMAL LOW (ref 20–29)
Calcium: 8.8 mg/dL (ref 8.7–10.3)
Chloride: 99 mmol/L (ref 96–106)
Creatinine, Ser: 3.68 mg/dL — ABNORMAL HIGH (ref 0.57–1.00)
Globulin, Total: 2.8 g/dL (ref 1.5–4.5)
Glucose: 100 mg/dL — ABNORMAL HIGH (ref 70–99)
Potassium: 6.3 mmol/L (ref 3.5–5.2)
Sodium: 136 mmol/L (ref 134–144)
Total Protein: 6.3 g/dL (ref 6.0–8.5)
eGFR: 12 mL/min/{1.73_m2} — ABNORMAL LOW (ref >59)

## 2022-08-01 LAB — CBC WITH DIFFERENTIAL/PLATELET
Basophils Absolute: 0 10*3/uL (ref 0.0–0.2)
Basos: 0 %
EOS (ABSOLUTE): 0 10*3/uL (ref 0.0–0.4)
Eos: 0 %
Hematocrit: 30.8 % — ABNORMAL LOW (ref 34.0–46.6)
Hemoglobin: 9.8 g/dL — ABNORMAL LOW (ref 11.1–15.9)
Lymphocytes Absolute: 2 10*3/uL (ref 0.7–3.1)
Lymphs: 17 %
MCH: 29.8 pg (ref 26.6–33.0)
MCHC: 31.8 g/dL (ref 31.5–35.7)
MCV: 94 fL (ref 79–97)
Monocytes Absolute: 1 10*3/uL — ABNORMAL HIGH (ref 0.1–0.9)
Monocytes: 9 %
Neutrophils Absolute: 8.5 10*3/uL — ABNORMAL HIGH (ref 1.4–7.0)
Neutrophils: 73 %
Platelets: 344 10*3/uL (ref 150–450)
RBC: 3.29 x10E6/uL — ABNORMAL LOW (ref 3.77–5.28)
RDW: 15.3 % (ref 11.7–15.4)
WBC: 11.6 10*3/uL — ABNORMAL HIGH (ref 3.4–10.8)

## 2022-08-01 LAB — IMMATURE CELLS: MYELOCYTES: 1 % — ABNORMAL HIGH (ref 0–0)

## 2022-08-01 NOTE — Telephone Encounter (Signed)
Per Dr. Tobie Poet potassium is too high. Recommends kayexalate 15 mg qid wed-thurs-fri then to come Fri a.m. for stat recheck of potassium. Patient informed and verbalized understanding. Rx sent. Lab ordered, Nurse visit scheduled.

## 2022-08-03 ENCOUNTER — Inpatient Hospital Stay (HOSPITAL_COMMUNITY)
Admission: EM | Admit: 2022-08-03 | Discharge: 2022-08-13 | DRG: 854 | Disposition: A | Discharge status: Home Health | Payer: Medicare Other | Attending: Internal Medicine | Admitting: Internal Medicine

## 2022-08-03 ENCOUNTER — Other Ambulatory Visit: Payer: Self-pay

## 2022-08-03 ENCOUNTER — Other Ambulatory Visit: Payer: Medicare Other

## 2022-08-03 ENCOUNTER — Inpatient Hospital Stay (HOSPITAL_COMMUNITY): Payer: Medicare Other

## 2022-08-03 ENCOUNTER — Encounter (HOSPITAL_COMMUNITY): Payer: Self-pay

## 2022-08-03 DIAGNOSIS — R7881 Bacteremia: Secondary | ICD-10-CM | POA: Diagnosis not present

## 2022-08-03 DIAGNOSIS — E782 Mixed hyperlipidemia: Secondary | ICD-10-CM

## 2022-08-03 DIAGNOSIS — Z905 Acquired absence of kidney: Secondary | ICD-10-CM | POA: Diagnosis not present

## 2022-08-03 DIAGNOSIS — J9611 Chronic respiratory failure with hypoxia: Secondary | ICD-10-CM | POA: Diagnosis present

## 2022-08-03 DIAGNOSIS — I509 Heart failure, unspecified: Secondary | ICD-10-CM | POA: Diagnosis not present

## 2022-08-03 DIAGNOSIS — N185 Chronic kidney disease, stage 5: Secondary | ICD-10-CM | POA: Diagnosis not present

## 2022-08-03 DIAGNOSIS — Z91041 Radiographic dye allergy status: Secondary | ICD-10-CM | POA: Diagnosis not present

## 2022-08-03 DIAGNOSIS — C679 Malignant neoplasm of bladder, unspecified: Secondary | ICD-10-CM

## 2022-08-03 DIAGNOSIS — I110 Hypertensive heart disease with heart failure: Secondary | ICD-10-CM

## 2022-08-03 DIAGNOSIS — E8729 Other acidosis: Secondary | ICD-10-CM | POA: Diagnosis not present

## 2022-08-03 DIAGNOSIS — B952 Enterococcus as the cause of diseases classified elsewhere: Secondary | ICD-10-CM | POA: Diagnosis not present

## 2022-08-03 DIAGNOSIS — I5032 Chronic diastolic (congestive) heart failure: Secondary | ICD-10-CM

## 2022-08-03 DIAGNOSIS — Z9049 Acquired absence of other specified parts of digestive tract: Secondary | ICD-10-CM

## 2022-08-03 DIAGNOSIS — Z90710 Acquired absence of both cervix and uterus: Secondary | ICD-10-CM

## 2022-08-03 DIAGNOSIS — I959 Hypotension, unspecified: Secondary | ICD-10-CM | POA: Diagnosis not present

## 2022-08-03 DIAGNOSIS — Z87891 Personal history of nicotine dependence: Secondary | ICD-10-CM

## 2022-08-03 DIAGNOSIS — A419 Sepsis, unspecified organism: Secondary | ICD-10-CM | POA: Diagnosis present

## 2022-08-03 DIAGNOSIS — K219 Gastro-esophageal reflux disease without esophagitis: Secondary | ICD-10-CM | POA: Diagnosis present

## 2022-08-03 DIAGNOSIS — E875 Hyperkalemia: Secondary | ICD-10-CM

## 2022-08-03 DIAGNOSIS — G2581 Restless legs syndrome: Secondary | ICD-10-CM | POA: Diagnosis not present

## 2022-08-03 DIAGNOSIS — Z8709 Personal history of other diseases of the respiratory system: Secondary | ICD-10-CM

## 2022-08-03 DIAGNOSIS — I5033 Acute on chronic diastolic (congestive) heart failure: Secondary | ICD-10-CM | POA: Diagnosis not present

## 2022-08-03 DIAGNOSIS — D631 Anemia in chronic kidney disease: Secondary | ICD-10-CM | POA: Diagnosis present

## 2022-08-03 DIAGNOSIS — N189 Chronic kidney disease, unspecified: Secondary | ICD-10-CM

## 2022-08-03 DIAGNOSIS — E872 Acidosis, unspecified: Secondary | ICD-10-CM | POA: Diagnosis present

## 2022-08-03 DIAGNOSIS — Z85528 Personal history of other malignant neoplasm of kidney: Secondary | ICD-10-CM

## 2022-08-03 DIAGNOSIS — N136 Pyonephrosis: Secondary | ICD-10-CM | POA: Diagnosis present

## 2022-08-03 DIAGNOSIS — J90 Pleural effusion, not elsewhere classified: Secondary | ICD-10-CM

## 2022-08-03 DIAGNOSIS — R0902 Hypoxemia: Secondary | ICD-10-CM | POA: Diagnosis not present

## 2022-08-03 DIAGNOSIS — K2100 Gastro-esophageal reflux disease with esophagitis, without bleeding: Secondary | ICD-10-CM | POA: Diagnosis present

## 2022-08-03 DIAGNOSIS — J189 Pneumonia, unspecified organism: Secondary | ICD-10-CM | POA: Diagnosis not present

## 2022-08-03 DIAGNOSIS — J449 Chronic obstructive pulmonary disease, unspecified: Secondary | ICD-10-CM | POA: Diagnosis not present

## 2022-08-03 DIAGNOSIS — Z8551 Personal history of malignant neoplasm of bladder: Secondary | ICD-10-CM | POA: Diagnosis not present

## 2022-08-03 DIAGNOSIS — Z79899 Other long term (current) drug therapy: Secondary | ICD-10-CM | POA: Diagnosis not present

## 2022-08-03 DIAGNOSIS — N3289 Other specified disorders of bladder: Secondary | ICD-10-CM | POA: Diagnosis not present

## 2022-08-03 DIAGNOSIS — Z825 Family history of asthma and other chronic lower respiratory diseases: Secondary | ICD-10-CM

## 2022-08-03 DIAGNOSIS — D649 Anemia, unspecified: Secondary | ICD-10-CM | POA: Diagnosis not present

## 2022-08-03 DIAGNOSIS — N200 Calculus of kidney: Secondary | ICD-10-CM | POA: Diagnosis not present

## 2022-08-03 DIAGNOSIS — Z9981 Dependence on supplemental oxygen: Secondary | ICD-10-CM | POA: Diagnosis not present

## 2022-08-03 DIAGNOSIS — Z83438 Family history of other disorder of lipoprotein metabolism and other lipidemia: Secondary | ICD-10-CM

## 2022-08-03 DIAGNOSIS — Z91013 Allergy to seafood: Secondary | ICD-10-CM

## 2022-08-03 DIAGNOSIS — R6520 Severe sepsis without septic shock: Secondary | ICD-10-CM | POA: Diagnosis not present

## 2022-08-03 DIAGNOSIS — I10 Essential (primary) hypertension: Secondary | ICD-10-CM | POA: Diagnosis not present

## 2022-08-03 DIAGNOSIS — I700 Atherosclerosis of aorta: Secondary | ICD-10-CM | POA: Diagnosis not present

## 2022-08-03 DIAGNOSIS — I3139 Other pericardial effusion (noninflammatory): Secondary | ICD-10-CM

## 2022-08-03 DIAGNOSIS — N1330 Unspecified hydronephrosis: Secondary | ICD-10-CM | POA: Diagnosis not present

## 2022-08-03 DIAGNOSIS — I130 Hypertensive heart and chronic kidney disease with heart failure and stage 1 through stage 4 chronic kidney disease, or unspecified chronic kidney disease: Secondary | ICD-10-CM | POA: Diagnosis present

## 2022-08-03 DIAGNOSIS — M810 Age-related osteoporosis without current pathological fracture: Secondary | ICD-10-CM | POA: Diagnosis present

## 2022-08-03 DIAGNOSIS — I132 Hypertensive heart and chronic kidney disease with heart failure and with stage 5 chronic kidney disease, or end stage renal disease: Secondary | ICD-10-CM | POA: Diagnosis not present

## 2022-08-03 DIAGNOSIS — N184 Chronic kidney disease, stage 4 (severe): Secondary | ICD-10-CM | POA: Diagnosis present

## 2022-08-03 DIAGNOSIS — E785 Hyperlipidemia, unspecified: Secondary | ICD-10-CM | POA: Diagnosis not present

## 2022-08-03 DIAGNOSIS — Z452 Encounter for adjustment and management of vascular access device: Secondary | ICD-10-CM | POA: Diagnosis not present

## 2022-08-03 DIAGNOSIS — Z7951 Long term (current) use of inhaled steroids: Secondary | ICD-10-CM

## 2022-08-03 DIAGNOSIS — E860 Dehydration: Secondary | ICD-10-CM | POA: Diagnosis present

## 2022-08-03 DIAGNOSIS — E892 Postprocedural hypoparathyroidism: Secondary | ICD-10-CM | POA: Diagnosis present

## 2022-08-03 DIAGNOSIS — D630 Anemia in neoplastic disease: Secondary | ICD-10-CM | POA: Diagnosis not present

## 2022-08-03 DIAGNOSIS — N281 Cyst of kidney, acquired: Secondary | ICD-10-CM | POA: Diagnosis not present

## 2022-08-03 DIAGNOSIS — N10 Acute tubulo-interstitial nephritis: Secondary | ICD-10-CM | POA: Diagnosis not present

## 2022-08-03 DIAGNOSIS — Z8249 Family history of ischemic heart disease and other diseases of the circulatory system: Secondary | ICD-10-CM

## 2022-08-03 DIAGNOSIS — D72829 Elevated white blood cell count, unspecified: Secondary | ICD-10-CM | POA: Diagnosis present

## 2022-08-03 DIAGNOSIS — Z466 Encounter for fitting and adjustment of urinary device: Secondary | ICD-10-CM | POA: Diagnosis not present

## 2022-08-03 DIAGNOSIS — Z862 Personal history of diseases of the blood and blood-forming organs and certain disorders involving the immune mechanism: Secondary | ICD-10-CM

## 2022-08-03 DIAGNOSIS — Z888 Allergy status to other drugs, medicaments and biological substances status: Secondary | ICD-10-CM

## 2022-08-03 DIAGNOSIS — I129 Hypertensive chronic kidney disease with stage 1 through stage 4 chronic kidney disease, or unspecified chronic kidney disease: Secondary | ICD-10-CM | POA: Diagnosis not present

## 2022-08-03 DIAGNOSIS — N179 Acute kidney failure, unspecified: Secondary | ICD-10-CM

## 2022-08-03 DIAGNOSIS — R6510 Systemic inflammatory response syndrome (SIRS) of non-infectious origin without acute organ dysfunction: Secondary | ICD-10-CM | POA: Diagnosis not present

## 2022-08-03 DIAGNOSIS — M549 Dorsalgia, unspecified: Secondary | ICD-10-CM | POA: Diagnosis not present

## 2022-08-03 DIAGNOSIS — I38 Endocarditis, valve unspecified: Secondary | ICD-10-CM | POA: Diagnosis not present

## 2022-08-03 DIAGNOSIS — I081 Rheumatic disorders of both mitral and tricuspid valves: Secondary | ICD-10-CM | POA: Diagnosis not present

## 2022-08-03 DIAGNOSIS — N1832 Chronic kidney disease, stage 3b: Secondary | ICD-10-CM | POA: Insufficient documentation

## 2022-08-03 DIAGNOSIS — R944 Abnormal results of kidney function studies: Secondary | ICD-10-CM | POA: Diagnosis not present

## 2022-08-03 DIAGNOSIS — I5030 Unspecified diastolic (congestive) heart failure: Secondary | ICD-10-CM | POA: Diagnosis not present

## 2022-08-03 DIAGNOSIS — I080 Rheumatic disorders of both mitral and aortic valves: Secondary | ICD-10-CM | POA: Diagnosis not present

## 2022-08-03 DIAGNOSIS — E210 Primary hyperparathyroidism: Secondary | ICD-10-CM | POA: Diagnosis present

## 2022-08-03 DIAGNOSIS — K5730 Diverticulosis of large intestine without perforation or abscess without bleeding: Secondary | ICD-10-CM | POA: Diagnosis not present

## 2022-08-03 DIAGNOSIS — M109 Gout, unspecified: Secondary | ICD-10-CM | POA: Diagnosis present

## 2022-08-03 HISTORY — DX: Chronic kidney disease, stage 4 (severe): N18.4

## 2022-08-03 HISTORY — DX: Systemic inflammatory response syndrome (sirs) of non-infectious origin without acute organ dysfunction: R65.10

## 2022-08-03 LAB — COMPREHENSIVE METABOLIC PANEL
ALT: 15 IU/L (ref 0–32)
AST: 18 IU/L (ref 0–40)
Albumin/Globulin Ratio: 1.6 (ref 1.2–2.2)
Albumin: 3.3 g/dL — ABNORMAL LOW (ref 3.8–4.8)
Alkaline Phosphatase: 95 IU/L (ref 44–121)
BUN/Creatinine Ratio: 12 (ref 12–28)
BUN: 84 mg/dL (ref 8–27)
Bilirubin Total: 0.4 mg/dL (ref 0.0–1.2)
CO2: 17 mmol/L — ABNORMAL LOW (ref 20–29)
Calcium: 8.1 mg/dL — ABNORMAL LOW (ref 8.7–10.3)
Chloride: 100 mmol/L (ref 96–106)
Creatinine, Ser: 6.84 mg/dL (ref 0.57–1.00)
Globulin, Total: 2.1 g/dL (ref 1.5–4.5)
Glucose: 96 mg/dL (ref 70–99)
Potassium: 6.7 mmol/L (ref 3.5–5.2)
Sodium: 132 mmol/L — ABNORMAL LOW (ref 134–144)
Total Protein: 5.4 g/dL — ABNORMAL LOW (ref 6.0–8.5)
eGFR: 6 mL/min/{1.73_m2} — ABNORMAL LOW (ref >59)

## 2022-08-03 LAB — CBC
HCT: 29 % — ABNORMAL LOW (ref 36.0–46.0)
Hemoglobin: 9.2 g/dL — ABNORMAL LOW (ref 12.0–15.0)
MCH: 30.6 pg (ref 26.0–34.0)
MCHC: 31.7 g/dL (ref 30.0–36.0)
MCV: 96.3 fL (ref 80.0–100.0)
Platelets: 242 10*3/uL (ref 150–400)
RBC: 3.01 MIL/uL — ABNORMAL LOW (ref 3.87–5.11)
RDW: 17.5 % — ABNORMAL HIGH (ref 11.5–15.5)
WBC: 23.9 10*3/uL — ABNORMAL HIGH (ref 4.0–10.5)
nRBC: 0 % (ref 0.0–0.2)

## 2022-08-03 LAB — TYPE AND SCREEN
ABO/RH(D): A POS
Antibody Screen: NEGATIVE

## 2022-08-03 LAB — BASIC METABOLIC PANEL
Anion gap: 16 — ABNORMAL HIGH (ref 5–15)
Anion gap: 15 (ref 5–15)
BUN: 86 mg/dL — ABNORMAL HIGH (ref 8–23)
BUN: 84 mg/dL — ABNORMAL HIGH (ref 8–23)
CO2: 15 mmol/L — ABNORMAL LOW (ref 22–32)
CO2: 16 mmol/L — ABNORMAL LOW (ref 22–32)
Calcium: 7.9 mg/dL — ABNORMAL LOW (ref 8.9–10.3)
Calcium: 8.1 mg/dL — ABNORMAL LOW (ref 8.9–10.3)
Chloride: 99 mmol/L (ref 98–111)
Chloride: 102 mmol/L (ref 98–111)
Creatinine, Ser: 6.88 mg/dL — ABNORMAL HIGH (ref 0.44–1.00)
Creatinine, Ser: 6.74 mg/dL — ABNORMAL HIGH (ref 0.44–1.00)
GFR, Estimated: 6 mL/min — ABNORMAL LOW (ref >60)
GFR, Estimated: 6 mL/min — ABNORMAL LOW (ref >60)
Glucose, Bld: 101 mg/dL — ABNORMAL HIGH (ref 70–99)
Glucose, Bld: 95 mg/dL (ref 70–99)
Potassium: 6.4 mmol/L (ref 3.5–5.1)
Potassium: 5 mmol/L (ref 3.5–5.1)
Sodium: 130 mmol/L — ABNORMAL LOW (ref 135–145)
Sodium: 133 mmol/L — ABNORMAL LOW (ref 135–145)

## 2022-08-03 LAB — MAGNESIUM: Magnesium: 1.7 mg/dL (ref 1.7–2.4)

## 2022-08-03 LAB — CK: Total CK: 43 U/L (ref 38–234)

## 2022-08-03 LAB — BRAIN NATRIURETIC PEPTIDE: B Natriuretic Peptide: 612.5 pg/mL — ABNORMAL HIGH (ref 0.0–100.0)

## 2022-08-03 MED ORDER — CALCIUM GLUCONATE 10 % IV SOLN
1.0000 g | Freq: Once | INTRAVENOUS | Status: AC
  Administered 2022-08-03: 1 g via INTRAVENOUS
  Filled 2022-08-03: qty 10

## 2022-08-03 MED ORDER — BUDESON-GLYCOPYRROL-FORMOTEROL 160-9-4.8 MCG/ACT IN AERO: 2.0000 | INHALATION_SPRAY | Freq: Two times a day (BID) | RESPIRATORY_TRACT | Status: DC

## 2022-08-03 MED ORDER — SODIUM BICARBONATE 8.4 % IV SOLN
50.0000 meq | Freq: Once | INTRAVENOUS | Status: AC
  Administered 2022-08-03: 50 meq via INTRAVENOUS
  Filled 2022-08-03: qty 50

## 2022-08-03 MED ORDER — MELATONIN 3 MG PO TABS
3.0000 mg | ORAL_TABLET | Freq: Every evening | ORAL | Status: DC | PRN
  Administered 2022-08-04 – 2022-08-11 (×8): 3 mg via ORAL
  Filled 2022-08-03 (×8): qty 1

## 2022-08-03 MED ORDER — FLUTICASONE FUROATE-VILANTEROL 200-25 MCG/ACT IN AEPB
1.0000 | INHALATION_SPRAY | Freq: Every day | RESPIRATORY_TRACT | Status: DC
  Administered 2022-08-04 – 2022-08-12 (×8): 1 via RESPIRATORY_TRACT
  Filled 2022-08-03: qty 28

## 2022-08-03 MED ORDER — PANTOPRAZOLE SODIUM 40 MG PO TBEC
40.0000 mg | DELAYED_RELEASE_TABLET | Freq: Every day | ORAL | Status: DC
  Administered 2022-08-05 – 2022-08-13 (×9): 40 mg via ORAL
  Filled 2022-08-03 (×9): qty 1

## 2022-08-03 MED ORDER — SODIUM ZIRCONIUM CYCLOSILICATE 10 G PO PACK
10.0000 g | PACK | Freq: Once | ORAL | Status: AC
  Administered 2022-08-03: 10 g via ORAL
  Filled 2022-08-03: qty 1

## 2022-08-03 MED ORDER — LACTATED RINGERS IV SOLN: INTRAVENOUS | Status: DC

## 2022-08-03 MED ORDER — ALBUTEROL SULFATE (2.5 MG/3ML) 0.083% IN NEBU
10.0000 mg | INHALATION_SOLUTION | Freq: Once | RESPIRATORY_TRACT | Status: AC
  Administered 2022-08-03: 10 mg via RESPIRATORY_TRACT
  Filled 2022-08-03: qty 12

## 2022-08-03 MED ORDER — INSULIN ASPART 100 UNIT/ML IV SOLN
5.0000 [IU] | Freq: Once | INTRAVENOUS | Status: AC
  Administered 2022-08-03: 5 [IU] via INTRAVENOUS

## 2022-08-03 MED ORDER — IPRATROPIUM BROMIDE 0.02 % IN SOLN: 0.2500 mg | Freq: Two times a day (BID) | RESPIRATORY_TRACT | Status: DC

## 2022-08-03 MED ORDER — SODIUM CHLORIDE 0.9 % IV BOLUS
1000.0000 mL | Freq: Once | INTRAVENOUS | Status: AC
  Administered 2022-08-03: 1000 mL via INTRAVENOUS

## 2022-08-03 MED ORDER — SODIUM BICARBONATE 650 MG PO TABS
650.0000 mg | ORAL_TABLET | Freq: Three times a day (TID) | ORAL | Status: DC
  Administered 2022-08-04 – 2022-08-13 (×26): 650 mg via ORAL
  Filled 2022-08-03 (×27): qty 1

## 2022-08-03 MED ORDER — ATORVASTATIN CALCIUM 40 MG PO TABS
40.0000 mg | ORAL_TABLET | Freq: Every day | ORAL | Status: DC
  Administered 2022-08-05 – 2022-08-13 (×9): 40 mg via ORAL
  Filled 2022-08-03 (×9): qty 1

## 2022-08-03 MED ORDER — UMECLIDINIUM BROMIDE 62.5 MCG/ACT IN AEPB
1.0000 | INHALATION_SPRAY | Freq: Every day | RESPIRATORY_TRACT | Status: DC
  Administered 2022-08-04 – 2022-08-12 (×8): 1 via RESPIRATORY_TRACT
  Filled 2022-08-03 (×2): qty 7

## 2022-08-03 MED ORDER — SODIUM CHLORIDE 0.9 % IV BOLUS
500.0000 mL | Freq: Once | INTRAVENOUS | Status: AC
  Administered 2022-08-03: 500 mL via INTRAVENOUS

## 2022-08-03 MED ORDER — ACETAMINOPHEN 325 MG PO TABS: 650.0000 mg | ORAL_TABLET | Freq: Four times a day (QID) | ORAL | Status: DC | PRN

## 2022-08-03 MED ORDER — ACETAMINOPHEN 650 MG RE SUPP: 650.0000 mg | Freq: Four times a day (QID) | RECTAL | Status: DC | PRN

## 2022-08-03 MED ORDER — METOPROLOL TARTRATE 25 MG PO TABS
25.0000 mg | ORAL_TABLET | Freq: Two times a day (BID) | ORAL | Status: DC
  Administered 2022-08-04: 25 mg via ORAL
  Filled 2022-08-03 (×2): qty 1

## 2022-08-03 MED ORDER — DEXTROSE 50 % IV SOLN
1.0000 | Freq: Once | INTRAVENOUS | Status: AC
  Administered 2022-08-03: 50 mL via INTRAVENOUS
  Filled 2022-08-03: qty 50

## 2022-08-03 MED ORDER — SODIUM BICARBONATE 650 MG PO TABS: 650.0000 mg | ORAL_TABLET | Freq: Three times a day (TID) | ORAL | Status: DC

## 2022-08-03 MED ORDER — ROPINIROLE HCL 1 MG PO TABS
2.0000 mg | ORAL_TABLET | Freq: Two times a day (BID) | ORAL | Status: DC
  Administered 2022-08-04 – 2022-08-13 (×19): 2 mg via ORAL
  Filled 2022-08-03 (×19): qty 2

## 2022-08-03 MED ORDER — LACTATED RINGERS IV BOLUS
1000.0000 mL | Freq: Once | INTRAVENOUS | Status: AC
  Administered 2022-08-03: 1000 mL via INTRAVENOUS

## 2022-08-03 MED ORDER — LEVALBUTEROL HCL 0.63 MG/3ML IN NEBU: 1.2500 mg | INHALATION_SOLUTION | Freq: Four times a day (QID) | RESPIRATORY_TRACT | Status: DC | PRN

## 2022-08-03 MED ORDER — FERROUS SULFATE 325 (65 FE) MG PO TABS
325.0000 mg | ORAL_TABLET | Freq: Every day | ORAL | Status: DC
  Administered 2022-08-04 – 2022-08-13 (×9): 325 mg via ORAL
  Filled 2022-08-03 (×10): qty 1

## 2022-08-03 NOTE — ED Provider Notes (Addendum)
[facility] Provider Note   CSN: [id number] Arrival date & time: 08/03/22  1606     History  Chief Complaint  Patient presents with   Abnormal Lab    Alexandra [name] is a 82 y.o. female.  Pt with remote hx left nephrectomy and CKD, recent general weakness/decreased po, presents indicating was told her potassium level was high and to go to ER.  Pt indicates recently has felt mildly generally weak - no focal numbness/weakness. Is drinking fluids, but indicates is not a big eater, not much of an appetite, and has noted decreased po intake in past several days.  Has noted possibly mild decrease in amount of urine, and mild bilateral ankle swelling. Uses home o2, no increased sob. No chest pain. No abd pain or nv. No dysuria. No fever or chills. Pt not sure of k level.   The history is provided by the patient and medical records.  Abnormal Lab      Home Medications Prior to Admission medications   Medication Sig Start Date End Date Taking? Authorizing Provider  atorvastatin (LIPITOR) 40 MG tablet Take 1 tablet (40 mg total) by mouth daily. Per ccm 10/24/21   Rip Harbour, NP  Budeson-Glycopyrrol-Formoterol (BREZTRI AEROSPHERE) 160-9-4.8 MCG/ACT AERO Inhale 2 puffs into the lungs in the morning and at bedtime. 07/31/22   Cox, Elnita Maxwell, MD  budesonide (PULMICORT) 0.5 MG/2ML nebulizer solution Take 0.5 mg by nebulization 2 (two) times daily.    [provider]  Cholecalciferol (VITAMIN D) 125 MCG (5000 UT) CAPS Take 1 capsule by mouth daily.    [provider]  FERROUS SULFATE PO Take by mouth.    [provider]  ipratropium (ATROVENT) 0.02 % nebulizer solution Take by nebulization 2 (two) times daily. 06/22/22   [provider]  levalbuterol Penne Lash) 1.25 MG/3ML nebulizer solution Take 1.25 mg by nebulization 2 (two) times daily. 06/22/22   [provider]  metoprolol tartrate (LOPRESSOR) 25 MG  tablet Take 25 mg by mouth 2 (two) times daily.    [provider]  nitroGLYCERIN (NITROSTAT) 0.4 MG SL tablet Place 1 tablet (0.4 mg total) under the tongue every 5 (five) minutes as needed for chest pain. 07/13/22   Neil Crouch, FNP  ondansetron (ZOFRAN-ODT) 4 MG disintegrating tablet Take 1 tablet (4 mg total) by mouth every 8 (eight) hours as needed for nausea or vomiting. 07/31/22   Cox, Elnita Maxwell, MD  pantoprazole (PROTONIX) 40 MG tablet Take 40 mg by mouth daily. 06/16/22   [provider]  polyethylene glycol powder (GLYCOLAX/MIRALAX) 17 GM/SCOOP powder Take 17 g by mouth daily. 06/29/22   [provider]  rOPINIRole (REQUIP) 2 MG tablet TAKE 1 TABLET(2 MG) BY MOUTH IN THE MORNING AND AT BEDTIME 11/28/21   Rip Harbour, NP  Sennosides-Docusate Sodium (SB DOCUSATE SODIUM-SENNA PO) Take 1 tablet by mouth in the morning and at bedtime.    [provider]  sodium bicarbonate 650 MG tablet Take 650 mg by mouth 3 (three) times daily.    [provider]  torsemide (DEMADEX) 20 MG tablet Take 20 mg by mouth daily as needed (edema).    [provider]  VENTOLIN HFA 108 (90 Base) MCG/ACT inhaler Inhale 1-2 puffs into the lungs every 6 (six) hours as needed for wheezing or shortness of breath. 07/31/22   CoxElnita Maxwell, MD      Allergies    Contrast media [iodinated contrast media], Omeprazole, Shellfish  allergy, and Shellfish-derived products    Review of Systems   Review of Systems  Constitutional:  Positive for appetite change. Negative for chills and fever.  HENT:  Negative for sore throat.   Eyes:  Negative for visual disturbance.  Respiratory:  Negative for cough and shortness of breath.   Cardiovascular:  Negative for chest pain.  Gastrointestinal:  Negative for abdominal pain, diarrhea and vomiting.  Genitourinary:  Positive for decreased urine volume. Negative for dysuria and flank pain.  Musculoskeletal:  Negative for back pain and neck  pain.  Skin:  Negative for rash.  Neurological:  Negative for headaches.  Hematological:  Does not bruise/bleed easily.  Psychiatric/Behavioral:  Negative for confusion.     Physical Exam Updated Vital Signs BP 110/72   Pulse (!) 104   Temp 98.4 F (36.9 C) (Oral)   Resp (!) 23   Ht 1.575 m ('5\' 2"'$ )   Wt 66.6 kg   SpO2 99%   BMI 26.85 kg/m  Physical Exam Vitals and nursing note reviewed.  Constitutional:      Appearance: Normal appearance. She is well-developed.  HENT:     Head: Atraumatic.     Nose: Nose normal.     Mouth/Throat:     Mouth: Mucous membranes are moist.  Eyes:     General: No scleral icterus.    Conjunctiva/sclera: Conjunctivae normal.     Pupils: Pupils are equal, round, and reactive to light.  Neck:     Trachea: No tracheal deviation.  Cardiovascular:     Rate and Rhythm: Normal rate and regular rhythm.     Pulses: Normal pulses.     Heart sounds: Normal heart sounds. No murmur heard.    No friction rub. No gallop.  Pulmonary:     Effort: Pulmonary effort is normal. No respiratory distress.     Breath sounds: Normal breath sounds.  Abdominal:     General: Bowel sounds are normal. There is no distension.     Palpations: Abdomen is soft.     Tenderness: There is no abdominal tenderness.  Genitourinary:    Comments: No cva tenderness.  Musculoskeletal:     Cervical back: Normal range of motion and neck supple. No rigidity or tenderness. No muscular tenderness.     Comments: Mild symmetric bilateral ankle edema.   Skin:    General: Skin is warm and dry.     Findings: No rash.  Neurological:     Mental Status: She is alert.     Comments: Alert, speech normal. Motor/sens grossly intact bil.   Psychiatric:        Mood and Affect: Mood normal.     ED Results / Procedures / Treatments   Labs (all labs ordered are listed, but only abnormal results are displayed) Results for orders placed or performed during the hospital encounter of [id number]   Basic metabolic panel  Result Value Ref Range   Sodium 130 (L) 135 - 145 mmol/L   Potassium 6.4 (HH) 3.5 - 5.1 mmol/L   Chloride 99 98 - 111 mmol/L   CO2 15 (L) 22 - 32 mmol/L   Glucose, Bld 101 (H) 70 - 99 mg/dL   BUN 86 (H) 8 - 23 mg/dL   Creatinine, Ser 6.88 (H) 0.44 - 1.00 mg/dL   Calcium 7.9 (L) 8.9 - 10.3 mg/dL   GFR, Estimated 6 (L) >60 mL/min   Anion gap 16 (H) 5 - 15  CBC  Result Value Ref Range   WBC  23.9 (H) 4.0 - 10.5 K/uL   RBC 3.01 (L) 3.87 - 5.11 MIL/uL   Hemoglobin 9.2 (L) 12.0 - 15.0 g/dL   HCT 29.0 (L) 36.0 - 46.0 %   MCV 96.3 80.0 - 100.0 fL   MCH 30.6 26.0 - 34.0 pg   MCHC 31.7 30.0 - 36.0 g/dL   RDW 17.5 (H) 11.5 - 15.5 %   Platelets 242 150 - 400 K/uL   nRBC 0.0 0.0 - 0.2 %  Magnesium  Result Value Ref Range   Magnesium 1.7 1.7 - 2.4 mg/dL  Basic metabolic panel  Result Value Ref Range   Sodium 133 (L) 135 - 145 mmol/L   Potassium 5.0 3.5 - 5.1 mmol/L   Chloride 102 98 - 111 mmol/L   CO2 16 (L) 22 - 32 mmol/L   Glucose, Bld 95 70 - 99 mg/dL   BUN 84 (H) 8 - 23 mg/dL   Creatinine, Ser 6.74 (H) 0.44 - 1.00 mg/dL   Calcium 8.1 (L) 8.9 - 10.3 mg/dL   GFR, Estimated 6 (L) >60 mL/min   Anion gap 15 5 - 15  Brain natriuretic peptide  Result Value Ref Range   B Natriuretic Peptide 612.5 (H) 0.0 - 100.0 pg/mL  CK  Result Value Ref Range   Total CK 43 38 - 234 U/L  Type and screen [place]  Result Value Ref Range   ABO/RH(D) PENDING    Antibody Screen PENDING    Sample Expiration      08/06/2022,2359 Performed at [facility], [address] [place], [place] [address]      EKG EKG Interpretation  Date/Time:  Friday August 03 2022 17:25:53 EST Ventricular Rate:  81 PR Interval:  159 QRS Duration: 94 QT Interval:  398 QTC Calculation: 462 R Axis:   2 Text Interpretation: Sinus rhythm Nonspecific T wave abnormality Confirmed by Lajean Saver [phone]) on 08/03/2022 7:50:39 PM  Radiology CT Renal Stone  Study  Result Date: 08/03/2022 CLINICAL DATA:  Rule out obstructive uropathy. Routine blood work demonstrated high potassium levels. EXAM: CT ABDOMEN AND PELVIS WITHOUT CONTRAST TECHNIQUE: Multidetector CT imaging of the abdomen and pelvis was performed following the standard protocol without IV contrast. RADIATION DOSE REDUCTION: This exam was performed according to the departmental dose-optimization program which includes automated exposure control, adjustment of the mA and/or kV according to patient size and/or use of iterative reconstruction technique. COMPARISON:  07/20/2022 FINDINGS: Lower chest: Trace bilateral pleural effusions with atelectasis in the lung bases. Emphysematous changes in the lungs. Hepatobiliary: Surgical absence of the gallbladder. No focal liver lesions. Scattered calcification in the left lobe of the liver, likely granulomas. Pancreas: Unremarkable. No pancreatic ductal dilatation or surrounding inflammatory changes. Spleen: Normal in size without focal abnormality. Adrenals/Urinary Tract: No adrenal gland nodules. Surgical absence of the left kidney. Right kidney is rotated. There is a right ureteral stent with proximal pigtail in the renal pelvis and distal pigtail in the bladder. Moderate right renal hydronephrosis, progressing since prior study. This may indicate stent malfunction. Several cysts in the right kidney. Largest is hyperdense, measuring 3.6 cm diameter. This is unchanged since the prior study and likely represents a hemorrhagic cyst. Stranding around the right ureter may indicate changes due to pyelonephritis or other inflammatory process. Bladder is decompressed, limiting evaluation but there is asymmetric wall thickening of the right bladder wall, unchanged since prior study. Stomach/Bowel: Stomach, small bowel, and colon are not abnormally distended. Residual densities scattered throughout the  small bowel and colon are new since prior study, likely representing  ingested contrast material or other radiodense material. No evidence of bowel wall thickening or inflammatory stranding. Appendix is not identified. Colonic diverticula without evidence of acute diverticulitis. Vascular/Lymphatic: Calcification of the aorta. No aneurysm. No significant lymphadenopathy. Reproductive: Status post hysterectomy. No adnexal masses. Other: No free air or free fluid in the abdomen. Presacral soft tissue stranding is similar to prior study and may be inflammatory. Mild edema in the subcutaneous fat. Musculoskeletal: Degenerative changes in the spine. Postoperative fixation of the lower spine. IMPRESSION: 1. Interval development of right hydronephrosis despite presence of a ureteral stent. This may represent stent malfunction. Stranding along the right ureter may also indicate infection such as pyelonephritis. 2. Hyperdense right renal cyst is unchanged since prior study, likely hemorrhagic cyst. No imaging follow-up is indicated. 3. Asymmetric wall thickening of the right side of the bladder is unchanged since prior study. Etiology is indeterminate. 4. Aortic atherosclerosis. Electronically Signed   By: Lucienne Capers M.D.   On: 08/03/2022 21:49   US Renal  Result Date: 08/03/2022 CLINICAL DATA:  History of prior left nephrectomy, presenting with acute kidney injury. EXAM: RENAL / URINARY TRACT ULTRASOUND COMPLETE COMPARISON:  July 23, 2022 FINDINGS: Right Kidney: Renal measurements: 11.2 cm x 6.6 cm x 6.4 cm = volume: 243.2 mL. Diffusely increased echogenicity of the renal parenchyma is noted. A 3.2 cm x 3.5 cm x 2.9 cm complex renal cyst is seen within the upper pole of the right kidney. An adjacent 1.6 cm x 1.5 cm x 1.6 cm simple right renal cyst is noted. These areas are seen on the prior study. There is moderate severity right-sided hydronephrosis which represents a new finding. The proximal portion of a right-sided endo ureteral stent is seen within the proximal right  ureter. Left Kidney: The left kidney is surgically absent. Bladder: Poorly distended and subsequently limited in evaluation. The distal portion of a right-sided endo ureteral stent is visualized. Other: None. IMPRESSION: 1. Right-sided endo ureteral stent, as described above, with moderate severity right-sided hydronephrosis and proximal hydroureter. 2. Diffusely increased renal echogenicity which likely represents sequelae associated with acute kidney injury. 3. Complex and simple right renal cysts, as described above. 4. Findings consistent with prior left nephrectomy. Electronically Signed   By: Virgina Norfolk M.D.   On: 08/03/2022 21:34   DG Chest Port 1 View  Result Date: 08/03/2022 CLINICAL DATA:  Leukocytosis EXAM: PORTABLE CHEST 1 VIEW COMPARISON:  07/22/2022 FINDINGS: Stable cardiomediastinal silhouette. Aortic atherosclerotic calcification. Right chest wall Port-A-Cath tip in the low SVC. Bibasilar atelectasis/consolidation. Question small bilateral pleural effusions. No pneumothorax. IMPRESSION: Bibasilar atelectasis/consolidation. Pneumonia is difficult to exclude. Question small bilateral pleural effusions. Electronically Signed   By: Placido Sou M.D.   On: 08/03/2022 21:16    Procedures Procedures    Medications Ordered in ED Medications  acetaminophen (TYLENOL) tablet 650 mg (has no administration in time range)    Or  acetaminophen (TYLENOL) suppository 650 mg (has no administration in time range)  melatonin tablet 3 mg (has no administration in time range)  sodium chloride 0.9 % bolus 1,000 mL (has no administration in time range)  sodium bicarbonate injection 50 mEq (has no administration in time range)  atorvastatin (LIPITOR) tablet 40 mg (has no administration in time range)  ferrous sulfate tablet 325 mg (has no administration in time range)  levalbuterol (XOPENEX) nebulizer solution 1.25 mg (has no administration in time range)  metoprolol tartrate (LOPRESSOR) tablet  25 mg (has no administration in time range)  pantoprazole (PROTONIX) EC tablet 40 mg (has no administration in time range)  rOPINIRole (REQUIP) tablet 2 mg (has no administration in time range)  sodium bicarbonate tablet 650 mg (has no administration in time range)  fluticasone furoate-vilanterol (BREO ELLIPTA) 200-25 MCG/ACT 1 puff (has no administration in time range)  umeclidinium bromide (INCRUSE ELLIPTA) 62.5 MCG/ACT 1 puff (has no administration in time range)  sodium zirconium cyclosilicate (LOKELMA) packet 10 g (10 g Oral Given 08/03/22 1731)  calcium gluconate inj 10% (1 g) URGENT USE ONLY! (1 g Intravenous Given 08/03/22 1740)  albuterol (PROVENTIL) (2.5 MG/3ML) 0.083% nebulizer solution 10 mg (10 mg Nebulization Given 08/03/22 1759)  sodium bicarbonate injection 50 mEq (50 mEq Intravenous Given 08/03/22 1750)  insulin aspart (novoLOG) injection 5 Units (5 Units Intravenous Given 08/03/22 1756)    And  dextrose 50 % solution 50 mL (50 mLs Intravenous Given 08/03/22 1754)  sodium chloride 0.9 % bolus 500 mL (500 mLs Intravenous Bolus 08/03/22 1749)  lactated ringers bolus 1,000 mL (0 mLs Intravenous Stopped 08/03/22 2000)    ED Course/ Medical Decision Making/ A&P                             Medical Decision Making Problems Addressed: AKI (acute kidney injury) [facility]): acute illness or injury with systemic symptoms that poses a threat to life or bodily functions Dehydration: acute illness or injury with systemic symptoms that poses a threat to life or bodily functions Hyperkalemia: acute illness or injury with systemic symptoms that poses a threat to life or bodily functions 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 Labs: ordered. Decision-making details documented in ED  Course. Radiology: ordered. ECG/medicine tests: ordered and independent interpretation performed. Decision-making details documented in ED Course. Discussion of management or test interpretation with external provider(s):  medicine  Risk OTC drugs. Prescription drug management. Decision regarding hospitalization.   Iv ns. Continuous pulse ox and cardiac monitoring. Labs ordered/sent.   Differential diagnosis includes hyperkalemia, dehydration, aki, etc . Dispo decision including potential need for admission considered - will get labs and reassess.   Reviewed nursing notes and prior charts for additional history. External reports reviewed.   Cardiac monitor: sinus rhythm, rate 80.  Ivf ordered. Labs pending.   Labs reviewed/interpreted by me - k v high, 6.7. hyperkalemia order set used  - cal gluc iv. Hco3 iv. D50 iv, insulin iv. Lokelma po, and ns iv.   Medicine consulted for admission. Discussed pt, will admit.   Additional iv/ns bolus.   CRITICAL CARE RE: severe hyperkalemia requiring multiple parenteral therapies, aki. R/o ureteral obstruction Performed by: Mirna Mires Total critical care time: 110 minutes Critical care time was exclusive of separately billable procedures and treating other patients. Critical care was necessary to treat or prevent imminent or life-threatening deterioration. Critical care was time spent personally by me on the following activities: development of treatment plan with patient and/or surrogate as well as nursing, discussions with consultants, evaluation of patient's response to treatment, examination of patient, obtaining history from patient or surrogate, ordering and performing treatments and interventions, ordering and review of laboratory studies, ordering and review of radiographic studies, pulse oximetry and re-evaluation of patient's condition.   U/s to  r/o obstructive uropathy ordered/pending.   Nephrology consulted. Discussed pt, will  see in ED.   U/s, ct results noted, right hydronephrosis despite stent. Urology consulted, discussed with Dr Cain Sieve who reviewed imaging - he will see in consult, indicates keep npo for now, and may need to go to OR tomorrow AM depending.   Repeat bmet, k improved.          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

## 2022-08-03 NOTE — ED Triage Notes (Signed)
Coming from home 3 days ago went for routine blood work called her today for evaluation for high potassium level. Alert and oriented lives with husband. 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   110/64 HR 82 97% 2L baseline 98.5 17 RR

## 2022-08-03 NOTE — Assessment & Plan Note (Signed)
Check CBC 

## 2022-08-03 NOTE — Consult Note (Addendum)
Urology Consult   Physician requesting consult: ***  Reason for consult: ***  History of Present Illness: Alexandra [name] is a 82 y.o. ***  Patient has history of CKD - on review of labs, baseline Cr around 3 but does seem to be steadily trending up over the last years or so. Her Creatining was 3.7 earlier this week but on a lab check earlier today had risen to 6.84. Her labs are also notable for a WBC of 23.9. Her K+ was elevated on presentation but has improved with intervention.   CT scan from ED visit shows mild right hydronephrosis with stent in place. The stent appears to be properly positioned. There is some asymmetric bladder wall thickening as well.   I reviewed all outside imagine - there is a CT scan from earlier this month (2/9) that shows the right ureteral stent and the right collecting system is decompressed. There is a renal ultrasound from October which shows no hydro and a stent was not in place at that time.   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 82 yo   BACK SURGERY  2016   lower L 4 to L 5   bladder tack and uterus lift  10/10/2015   CHOLECYSTECTOMY     summer of 2022   IR RADIOLOGIST EVAL & MGMT  12/03/2018   IR RADIOLOGIST EVAL & MGMT  12/31/2018   PARATHYROIDECTOMY Left 07/02/2016   Procedure: LEFT INFERIOR PARATHYROIDECTOMY;  Surgeon: Armandina Gemma, MD;  Location: [facility];  Service: General;  Laterality: Left;   SHOULDER OPEN ROTATOR CUFF REPAIR  12/2019   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    Current Hospital Medications:  Home Meds:  No  current facility-administered medications on file prior to encounter.   Current Outpatient Medications on File Prior to Encounter  Medication Sig Dispense Refill   atorvastatin (LIPITOR) 40 MG tablet Take 1 tablet (40 mg total) by mouth daily. Per ccm 90 tablet 3   Budeson-Glycopyrrol-Formoterol (BREZTRI AEROSPHERE) 160-9-4.8 MCG/ACT AERO Inhale 2 puffs into the lungs in the morning and at bedtime. 10.7 g 4   budesonide (PULMICORT) 0.5 MG/2ML nebulizer solution Take 0.5 mg by nebulization 2 (two) times daily.     Cholecalciferol (VITAMIN D) 125 MCG (5000 UT) CAPS Take 1 capsule by mouth daily.     FERROUS SULFATE PO Take by mouth.     ipratropium (ATROVENT) 0.02 % nebulizer solution Take by nebulization 2 (two) times daily.     levalbuterol (XOPENEX) 1.25 MG/3ML nebulizer solution Take 1.25 mg by nebulization 2 (two) times daily.     metoprolol tartrate (LOPRESSOR) 25 MG tablet Take 25 mg by mouth 2 (two) times daily.     nitroGLYCERIN (NITROSTAT) 0.4 MG SL tablet Place 1 tablet (0.4 mg total) under the tongue every 5 (five) minutes as needed for chest pain. 30 tablet 3   ondansetron (ZOFRAN-ODT) 4 MG disintegrating tablet Take 1 tablet (4 mg total) by mouth every 8 (eight) hours as needed for nausea or vomiting. 20 tablet 0  pantoprazole (PROTONIX) 40 MG tablet Take 40 mg by mouth daily.     polyethylene glycol powder (GLYCOLAX/MIRALAX) 17 GM/SCOOP powder Take 17 g by mouth daily.     rOPINIRole (REQUIP) 2 MG tablet TAKE 1 TABLET(2 MG) BY MOUTH IN THE MORNING AND AT BEDTIME 180 tablet 6   Sennosides-Docusate Sodium (SB DOCUSATE SODIUM-SENNA PO) Take 1 tablet by mouth in the morning and at bedtime.     sodium bicarbonate 650 MG tablet Take 650 mg by mouth 3 (three) times daily.     torsemide (DEMADEX) 20 MG tablet Take 20 mg by mouth daily as needed (edema).     VENTOLIN HFA 108 (90 Base) MCG/ACT inhaler Inhale 1-2 puffs into the lungs every 6 (six) hours as needed for wheezing or shortness of  breath. 18 g 2     Scheduled Meds:  [START ON 08/04/2022] atorvastatin  40 mg Oral Daily   [START ON 08/04/2022] ferrous sulfate  325 mg Oral Q breakfast   [START ON 08/04/2022] fluticasone furoate-vilanterol  1 puff Inhalation Daily   [START ON 08/04/2022] metoprolol tartrate  25 mg Oral BID   [START ON 08/04/2022] pantoprazole  40 mg Oral Daily   rOPINIRole  2 mg Oral BID   sodium bicarbonate  50 mEq Intravenous Once   [START ON 08/04/2022] sodium bicarbonate  650 mg Oral TID   [START ON 08/04/2022] umeclidinium bromide  1 puff Inhalation Daily   Continuous Infusions:  sodium chloride     PRN Meds:.acetaminophen **OR** acetaminophen, levalbuterol, melatonin  Allergies:  Allergies  Allergen Reactions   Contrast Media [Iodinated Contrast Media] Hives, Itching and Rash   Omeprazole Itching   Shellfish Allergy Nausea And Vomiting   Shellfish-Derived Products Nausea And Vomiting    Family History  Problem Relation Age of Onset   Emphysema Mother    Hyperlipidemia Sister    Hypertension Sister    COPD Sister    Hyperlipidemia Sister    Hypertension Sister    Lymphoma Sister    Obesity Brother    Hyperlipidemia Brother    Hypertension Brother    Breast cancer Neg Hx     Social History:  reports that she quit smoking about 17 years ago. Her smoking use included cigarettes. She has a 50.00 pack-year smoking history. She has never used smokeless tobacco. She reports that she does not drink alcohol and does not use drugs.  ROS: A complete review of systems was performed.  All systems are negative except for pertinent findings as noted.  Physical Exam:  Vital signs in last 24 hours: Temp:  [97.9 F (36.6 C)-98.4 F (36.9 C)] 98.4 F (36.9 C) (02/23 1955) Pulse Rate:  [76-104] 104 (02/23 1945) Resp:  [18-26] 23 (02/23 1945) BP: (100-110)/(51-95) 110/72 (02/23 1945) SpO2:  [91 %-100 %] 99 % (02/23 1945) Weight:  [66.6 kg] 66.6 kg (02/23 1611) Constitutional:  Alert and  oriented, No acute distress Cardiovascular: Regular rate and rhythm Respiratory: Normal respiratory effort, Lungs clear bilaterally GI: Abdomen is soft, nontender, nondistended, no abdominal masses GU: No CVA tenderness*** Neurologic: Grossly intact, no focal deficits Psychiatric: Normal mood and affect  Laboratory Data:  Recent Labs    08/03/22 1749  WBC 23.9*  HGB 9.2*  HCT 29.0*  PLT 242    Recent Labs    08/03/22 1030 08/03/22 1749 08/03/22 2107  NA 132* 130* 133*  K 6.7* 6.4* 5.0  CL 100 99 102  GLUCOSE 96 101* 95  BUN 84* 86* 84*  CALCIUM 8.1* 7.9* 8.1*  CREATININE 6.84* 6.88* 6.74*     Results for orders placed or performed during the hospital encounter of 08/03/22 (from the past 24 hour(s))  Basic metabolic panel     Status: Abnormal   Collection Time: 08/03/22  5:49 PM  Result Value Ref Range   Sodium 130 (L) 135 - 145 mmol/L   Potassium 6.4 (HH) 3.5 - 5.1 mmol/L   Chloride 99 98 - 111 mmol/L   CO2 15 (L) 22 - 32 mmol/L   Glucose, Bld 101 (H) 70 - 99 mg/dL   BUN 86 (H) 8 - 23 mg/dL   Creatinine, Ser 6.88 (H) 0.44 - 1.00 mg/dL   Calcium 7.9 (L) 8.9 - 10.3 mg/dL   GFR, Estimated 6 (L) >60 mL/min   Anion gap 16 (H) 5 - 15  CBC     Status: Abnormal   Collection Time: 08/03/22  5:49 PM  Result Value Ref Range   WBC 23.9 (H) 4.0 - 10.5 K/uL   RBC 3.01 (L) 3.87 - 5.11 MIL/uL   Hemoglobin 9.2 (L) 12.0 - 15.0 g/dL   HCT 29.0 (L) 36.0 - 46.0 %   MCV 96.3 80.0 - 100.0 fL   MCH 30.6 26.0 - 34.0 pg   MCHC 31.7 30.0 - 36.0 g/dL   RDW 17.5 (H) 11.5 - 15.5 %   Platelets 242 150 - 400 K/uL   nRBC 0.0 0.0 - 0.2 %  Magnesium     Status: None   Collection Time: 08/03/22  9:07 PM  Result Value Ref Range   Magnesium 1.7 1.7 - 2.4 mg/dL  Basic metabolic panel     Status: Abnormal   Collection Time: 08/03/22  9:07 PM  Result Value Ref Range   Sodium 133 (L) 135 - 145 mmol/L   Potassium 5.0 3.5 - 5.1 mmol/L   Chloride 102 98 - 111 mmol/L   CO2 16 (L) 22 - 32  mmol/L   Glucose, Bld 95 70 - 99 mg/dL   BUN 84 (H) 8 - 23 mg/dL   Creatinine, Ser 6.74 (H) 0.44 - 1.00 mg/dL   Calcium 8.1 (L) 8.9 - 10.3 mg/dL   GFR, Estimated 6 (L) >60 mL/min   Anion gap 15 5 - 15  Brain natriuretic peptide     Status: Abnormal   Collection Time: 08/03/22  9:07 PM  Result Value Ref Range   B Natriuretic Peptide 612.5 (H) 0.0 - 100.0 pg/mL  CK     Status: None   Collection Time: 08/03/22  9:07 PM  Result Value Ref Range   Total CK 43 38 - 234 U/L  Type and screen [facility]     Status: None (Preliminary result)   Collection Time: 08/03/22  9:07 PM  Result Value Ref Range   ABO/RH(D) PENDING    Antibody Screen PENDING    Sample Expiration      08/06/2022,2359 Performed at [facility], [place] [address], [place], [place] [address]    No results found for this or any previous visit (from the past 240 hour(s)).  Renal Function: Recent Labs    07/31/22 1415 08/03/22 1030 08/03/22 1749 08/03/22 2107  CREATININE 3.68* 6.84* 6.88* 6.74*   Estimated Creatinine Clearance: 6.1 mL/min (A) (by C-G formula based on SCr of 6.74 mg/dL (H)).  Radiologic Imaging: CT Renal Stone Study  Result Date: 08/03/2022 CLINICAL DATA:  Rule out obstructive uropathy. Routine blood work demonstrated high potassium levels. EXAM: CT  ABDOMEN AND PELVIS WITHOUT CONTRAST TECHNIQUE: Multidetector CT imaging of the abdomen and pelvis was performed following the standard protocol without IV contrast. RADIATION DOSE REDUCTION: This exam was performed according to the departmental dose-optimization program which includes automated exposure control, adjustment of the mA and/or kV according to patient size and/or use of iterative reconstruction technique. COMPARISON:  07/20/2022 FINDINGS: Lower chest: Trace bilateral pleural effusions with atelectasis in the lung bases. Emphysematous changes in the lungs. Hepatobiliary: Surgical absence of the gallbladder. No focal liver  lesions. Scattered calcification in the left lobe of the liver, likely granulomas. Pancreas: Unremarkable. No pancreatic ductal dilatation or surrounding inflammatory changes. Spleen: Normal in size without focal abnormality. Adrenals/Urinary Tract: No adrenal gland nodules. Surgical absence of the left kidney. Right kidney is rotated. There is a right ureteral stent with proximal pigtail in the renal pelvis and distal pigtail in the bladder. Moderate right renal hydronephrosis, progressing since prior study. This may indicate stent malfunction. Several cysts in the right kidney. Largest is hyperdense, measuring 3.6 cm diameter. This is unchanged since the prior study and likely represents a hemorrhagic cyst. Stranding around the right ureter may indicate changes due to pyelonephritis or other inflammatory process. Bladder is decompressed, limiting evaluation but there is asymmetric wall thickening of the right bladder wall, unchanged since prior study. Stomach/Bowel: Stomach, small bowel, and colon are not abnormally distended. Residual densities scattered throughout the small bowel and colon are new since prior study, likely representing ingested contrast material or other radiodense material. No evidence of bowel wall thickening or inflammatory stranding. Appendix is not identified. Colonic diverticula without evidence of acute diverticulitis. Vascular/Lymphatic: Calcification of the aorta. No aneurysm. No significant lymphadenopathy. Reproductive: Status post hysterectomy. No adnexal masses. Other: No free air or free fluid in the abdomen. Presacral soft tissue stranding is similar to prior study and may be inflammatory. Mild edema in the subcutaneous fat. Musculoskeletal: Degenerative changes in the spine. Postoperative fixation of the lower spine. IMPRESSION: 1. Interval development of right hydronephrosis despite presence of a ureteral stent. This may represent stent malfunction. Stranding along the right  ureter may also indicate infection such as pyelonephritis. 2. Hyperdense right renal cyst is unchanged since prior study, likely hemorrhagic cyst. No imaging follow-up is indicated. 3. Asymmetric wall thickening of the right side of the bladder is unchanged since prior study. Etiology is indeterminate. 4. Aortic atherosclerosis. Electronically Signed   By: Lucienne Capers M.D.   On: 08/03/2022 21:49   US Renal  Result Date: 08/03/2022 CLINICAL DATA:  History of prior left nephrectomy, presenting with acute kidney injury. EXAM: RENAL / URINARY TRACT ULTRASOUND COMPLETE COMPARISON:  July 23, 2022 FINDINGS: Right Kidney: Renal measurements: 11.2 cm x 6.6 cm x 6.4 cm = volume: 243.2 mL. Diffusely increased echogenicity of the renal parenchyma is noted. A 3.2 cm x 3.5 cm x 2.9 cm complex renal cyst is seen within the upper pole of the right kidney. An adjacent 1.6 cm x 1.5 cm x 1.6 cm simple right renal cyst is noted. These areas are seen on the prior study. There is moderate severity right-sided hydronephrosis which represents a new finding. The proximal portion of a right-sided endo ureteral stent is seen within the proximal right ureter. Left Kidney: The left kidney is surgically absent. Bladder: Poorly distended and subsequently limited in evaluation. The distal portion of a right-sided endo ureteral stent is visualized. Other: None. IMPRESSION: 1. Right-sided endo ureteral stent, as described above, with moderate severity right-sided hydronephrosis and proximal hydroureter.  2. Diffusely increased renal echogenicity which likely represents sequelae associated with acute kidney injury. 3. Complex and simple right renal cysts, as described above. 4. Findings consistent with prior left nephrectomy. Electronically Signed   By: Virgina Norfolk M.D.   On: 08/03/2022 21:34   DG Chest Port 1 View  Result Date: 08/03/2022 CLINICAL DATA:  Leukocytosis EXAM: PORTABLE CHEST 1 VIEW COMPARISON:  07/22/2022 FINDINGS:  Stable cardiomediastinal silhouette. Aortic atherosclerotic calcification. Right chest wall Port-A-Cath tip in the low SVC. Bibasilar atelectasis/consolidation. Question small bilateral pleural effusions. No pneumothorax. IMPRESSION: Bibasilar atelectasis/consolidation. Pneumonia is difficult to exclude. Question small bilateral pleural effusions. Electronically Signed   By: Placido Sou M.D.   On: 08/03/2022 21:16    I independently reviewed the above imaging studies.  Impression/Recommendation *** 2.  ***  Donald Pore MD 08/03/2022, 10:06 PM  [facility]  Pager: [phone]   CC: ***

## 2022-08-03 NOTE — H&P (Signed)
History and Physical      Alexandra [name] [id number] DOB: [birth year] DOA: 08/03/2022  PCP: Rochel Brome, MD  Patient coming from: home   I have personally briefly reviewed patient's old medical records in [place]  Chief Complaint: Elevated creatinine per outpatient labs  HPI: Alexandra [name] is a 82 y.o. 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   The patient conveys that she was undergoing routine outpatient labs which identified interval increase in serum creatinine to greater than 6 in the context of her known history of CKD 4, prompting instruction from her outpatient providers to present to the emergency department for further evaluation and management thereof, including that of interval development of hyperkalemia.  She has a documented history of CKD 4, associated baseline creatinine 2.6-3.6, with most recent prior serum creatinine to point noted to be 3.68 on 07/31/2022.  Her renal history is also notable for renal cell carcinoma status post left total nephrectomy in 1999.  She denies any recent acute symptoms, including no recent nausea, vomiting, diarrhea, decrease in appetite.  No recent trauma/fall.  No rash, subjective fever, chills, rigors, generalized myalgias.  In spite of interval increase in creatinine, she conveys that she continues to produce urine.  Denies any recent dysuria, gross hematuria, or change in urinary urgency/frequency.  No recent sore throat.  She also denies any recent chest pain, shortness of breath,  vomiting, PND, worsening peripheral edema, wheezing, cough.   Follows with Dr. Hollie Salk as her outpatient nephrologist.     ED Course:  Vital signs in the ED were notable for the following: Afebrile; heart rates in the 70s to low [id number]; systolic blood pressures in the low 100s to 110s; respiratory rate 18-24, oxygen saturation 98% on room air.  Labs were notable for the following: CMP notable for sodium 132, potassium 6.7 compared to 6.3 on 07/31/2022, bicarbonate 17, anion gap 16, BUN 84 compared to 56 on 07/31/2022, creatinine 6.84, BUN/creatinine ratio 12.3, glucose 101, calcium, just for mild hyperlipidemia noted to be 8.7, albumin 3.3, liver enzymes that are limits.  CBC notable for open cell count of 23,900 compared to most recent prior value of 11,600 on 07/31/2021, hemoglobin 9.2 this is normocytic/normochromic findings, and relative dimensions of prior value of 9.8 on 07/31/2022, platelet count 242.  Per my interpretation, EKG in ED demonstrated the following: Sinus rhythm with heart rate 81, normal intervals, nonspecific T wave inversion in aVL, no evidence of ST changes including no evidence of ST elevation.  Imaging and additional notable ED work-up: Renal ultrasound has been ordered, with result currently pending.  EDP contacted on-call nephrologist, Dr. Royce Macadamia, Requesting formal consultation, with additional recommendations pending at this time.  While in the ED, the following were administered: Albuterol nebulizer x 1, calcium gluconate 1 g IV x 1, NovoLog 5 units IV x 1 dose, give D50 x 1, bicarbonate 50 mEq IV x 1, Lokelma 10 g p.o. x 1 dose, normal saline x 1.5 L bolus, lactated Ringer's x 1 L bolus.  Subsequently, the patient was admitted for further evaluation management of acute kidney injury superimposed on  CKD 4 complicated by hyperkalemia, anion gap metabolic acidosis, with presenting labs also notable for leukocytosis.    Review of Systems: As per HPI otherwise 10 point  review of systems negative.   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 82 yo   BACK SURGERY  2016   lower L 4 to L 5   bladder tack and uterus lift  10/10/2015   CHOLECYSTECTOMY     summer of 2022   IR RADIOLOGIST EVAL & MGMT  12/03/2018   IR RADIOLOGIST EVAL & MGMT  12/31/2018   PARATHYROIDECTOMY Left 07/02/2016   Procedure: LEFT INFERIOR PARATHYROIDECTOMY;  Surgeon: Armandina Gemma, MD;  Location: [facility]S;  Service: General;  Laterality: Left;   SHOULDER OPEN ROTATOR CUFF REPAIR  12/2019   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    Social History:  reports that she quit smoking about 17 years ago. Her smoking use included cigarettes. She has a 50.00 pack-year smoking history. She has never used smokeless tobacco. She reports that she does not drink alcohol and does not use drugs.   Allergies  Allergen Reactions   Contrast Media [Iodinated Contrast Media] Hives, Itching and Rash   Omeprazole Itching   Shellfish Allergy Nausea And Vomiting   Shellfish-Derived Products Nausea And Vomiting    Family History  Problem Relation Age of Onset   Emphysema Mother    Hyperlipidemia Sister    Hypertension Sister    COPD Sister    Hyperlipidemia Sister    Hypertension Sister    Lymphoma Sister    Obesity Brother    Hyperlipidemia Brother    Hypertension Brother    Breast cancer Neg Hx     Family history reviewed and not pertinent    Prior to Admission medications   Medication Sig Start Date End Date Taking? Authorizing Provider  atorvastatin (LIPITOR) 40 MG tablet Take 1  tablet (40 mg total) by mouth daily. Per ccm 10/24/21   Rip Harbour, NP  Budeson-Glycopyrrol-Formoterol (BREZTRI AEROSPHERE) 160-9-4.8 MCG/ACT AERO Inhale 2 puffs into the lungs in the morning and at bedtime. 07/31/22   Cox, Elnita Maxwell, MD  budesonide (PULMICORT) 0.5 MG/2ML nebulizer solution Take 0.5 mg by nebulization 2 (two) times daily.    [provider]  Cholecalciferol (VITAMIN D) 125 MCG (5000 UT) CAPS Take 1 capsule by mouth daily.    [provider]  FERROUS SULFATE PO Take by mouth.    [provider]  ipratropium (ATROVENT) 0.02 % nebulizer solution Take by nebulization 2 (two) times daily. 06/22/22   [provider]  levalbuterol Penne Lash) 1.25 MG/3ML nebulizer solution Take 1.25 mg by nebulization 2 (two) times daily. 06/22/22   [provider]  metoprolol tartrate (LOPRESSOR) 25 MG tablet Take 25 mg by mouth 2 (two) times daily.    [provider]  nitroGLYCERIN (NITROSTAT) 0.4 MG SL tablet Place 1 tablet (0.4 mg total) under the tongue every 5 (five) minutes as needed for chest pain. 07/13/22   Neil Crouch, FNP  ondansetron (ZOFRAN-ODT) 4 MG disintegrating tablet  Take 1 tablet (4 mg total) by mouth every 8 (eight) hours as needed for nausea or vomiting. 07/31/22   Cox, Elnita Maxwell, MD  pantoprazole (PROTONIX) 40 MG tablet Take 40 mg by mouth daily. 06/16/22   [provider]  polyethylene glycol powder (GLYCOLAX/MIRALAX) 17 GM/SCOOP powder Take 17 g by mouth daily. 06/29/22   [provider]  rOPINIRole (REQUIP) 2 MG tablet TAKE 1 TABLET(2 MG) BY MOUTH IN THE MORNING AND AT BEDTIME 11/28/21   Rip Harbour, NP  Sennosides-Docusate Sodium (SB DOCUSATE SODIUM-SENNA PO) Take 1 tablet by mouth in the morning and at bedtime.    [provider]  sodium bicarbonate 650 MG tablet Take 650 mg by mouth 3 (three) times daily.    [provider]  torsemide (DEMADEX) 20 MG tablet Take 20 mg by mouth daily as needed  (edema).    [provider]  VENTOLIN HFA 108 (90 Base) MCG/ACT inhaler Inhale 1-2 puffs into the lungs every 6 (six) hours as needed for wheezing or shortness of breath. 07/31/22   Rochel Brome, MD     Objective    Physical Exam: Vitals:   08/03/22 1815 08/03/22 1830 08/03/22 1945 08/03/22 1955  BP: (!) 107/57 (!) 110/95 110/72   Pulse: 96 (!) 103 (!) 104   Resp: (!) 26 (!) 22 (!) 23   Temp:    98.4 F (36.9 C)  TempSrc:    Oral  SpO2: 100% 100% 99%   Weight:      Height:        General: appears to be stated age; alert, oriented Skin: warm, dry, no rash Head:  AT/Bernice Mouth:  Oral mucosa membranes appear dry, normal dentition Neck: supple; trachea midline Heart:  RRR; did not appreciate any M/R/G Lungs: CTAB, did not appreciate any wheezes, rales, or rhonchi Abdomen: + BS; soft, ND, NT Vascular: 2+ pedal pulses b/l; 2+ radial pulses b/l Extremities: no peripheral edema, no muscle wasting Neuro: strength and sensation intact in upper and lower extremities b/l     Labs on Admission: I have personally reviewed following labs and imaging studies  CBC: Recent Labs  Lab 07/31/22 1415 08/03/22 1749  WBC 11.6* 23.9*  NEUTROABS 8.5*  --   HGB 9.8* 9.2*  HCT 30.8* 29.0*  MCV 94 96.3  PLT 344 [id number]   Basic Metabolic Panel: Recent Labs  Lab 07/31/22 1415 08/03/22 1030 08/03/22 1749  NA 136 132* 130*  K 6.3* 6.7* 6.4*  CL 99 100 99  CO2 18* 17* 15*  GLUCOSE 100* 96 101*  BUN 56* 84* 86*  CREATININE 3.68* 6.84* 6.88*  CALCIUM 8.8 8.1* 7.9*   GFR: Estimated Creatinine Clearance: 5.9 mL/min (A) (by C-G formula based on SCr of 6.88 mg/dL (H)). Liver Function Tests: Recent Labs  Lab 07/31/22 1415 08/03/22 1030  AST 18 18  ALT 19 15  ALKPHOS 99 95  BILITOT 0.3 0.4  PROT 6.3 5.4*  ALBUMIN 3.5* 3.3*   No results for input(s): "LIPASE", "AMYLASE" in the last 168 hours. No results for input(s): "AMMONIA" in the last 168 hours. Coagulation Profile: No  results for input(s): "INR", "PROTIME" in the last 168 hours. Cardiac Enzymes: No results for input(s): "CKTOTAL", "CKMB", "CKMBINDEX", "TROPONINI" in the last 168 hours. BNP (last 3 results) Recent Labs    03/27/22 1449  PROBNP 1,313*   HbA1C: No results for input(s): "HGBA1C" in the last 72 hours. CBG: No results for input(s): "GLUCAP" in the last 168 hours. Lipid  Profile: No results for input(s): "CHOL", "HDL", "LDLCALC", "TRIG", "CHOLHDL", "LDLDIRECT" in the last 72 hours. Thyroid Function Tests: No results for input(s): "TSH", "T4TOTAL", "FREET4", "T3FREE", "THYROIDAB" in the last 72 hours. Anemia Panel: No results for input(s): "VITAMINB12", "FOLATE", "FERRITIN", "TIBC", "IRON", "RETICCTPCT" in the last 72 hours. Urine analysis:    Component Value Date/Time   COLORURINE YELLOW 05/30/2022 0916   APPEARANCEUR HAZY (A) 05/30/2022 0916   LABSPEC 1.014 05/30/2022 0916   PHURINE 5.0 05/30/2022 0916   GLUCOSEU NEGATIVE 05/30/2022 0916   HGBUR MODERATE (A) 05/30/2022 0916   BILIRUBINUR NEGATIVE 05/30/2022 0916   KETONESUR NEGATIVE 05/30/2022 0916   PROTEINUR 100 (A) 05/30/2022 0916   NITRITE NEGATIVE 05/30/2022 0916   LEUKOCYTESUR LARGE (A) 05/30/2022 0916    Radiological Exams on Admission: No results found.    Assessment/Plan   Principal Problem:   Acute renal failure superimposed on stage 4 chronic kidney disease (HCC) Active Problems:   Essential hypertension   Hyperlipidemia   History of anemia due to chronic kidney disease   Hyperkalemia   GERD (gastroesophageal reflux disease)   Restless leg syndrome   High anion gap metabolic acidosis   SIRS (systemic inflammatory response syndrome) (HCC)   Leukocytosis   History of COPD    #) Acute Kidney Injury superimposed on CKD 4:  as quantified above, and complicated by hyperkalemia as well as anion gap metabolic acidosis, but in the absence of any overt evidence acute volume overload or uremia.  Unclear source  for interval worsening of her kidney function at this time, noting a history of renal cell carcinoma status post left total nephrectomy in 1999.  Does not appear to require urgent hemodialysis at this time, she continues to produce urine, with repeat BMP interval decline in serum potassium level, now down to 6.4.  EDP has contacted on-call nephrology, requesting formal consultation with additional recommendations pending at this time.  In the interval, we will continue to pursue further diagnostic evaluation along with medical management, as outlined below.  Renal ultrasound to evaluate for postrenal obstructive contribution on the right has been ordered, with result currently pending.  Plan: monitor strict I's & O's and daily weights. Attempt to avoid nephrotoxic agents. Refrain from NSAIDs. Repeat CMP in the morning. Check serum magnesium level and serum phosphorus levels.  Check urinalysis with microscopy.  Add-on random urine sodium and random urine creatinine.  Check random urine protein to creatinine ratio.  Check CPK, BNP, chest x-ray.  Follow-up result of renal ultrasound, as above.  Nephrology consulted, as above.  Continues lactated Ringer's at 100 cc/h.  Repeat BMP at 1 AM on 08/04/2022.  Further evaluation management of associated hyperkalemia and anion gap metabolic acidosis, as above.  Resume home scheduled oral sodium bicarbonate.  Renal diet ordered.           #) Hyperkalemia: presenting serum potassium level of 6.7, up slightly from most recent prior value of 6.3 on 07/31/2022.  Appears to be in the setting of presenting AKI superimposed on CKD 4, complicated by associated anion gap metabolic acidosis, as above.  No evidence of corresponding EKG findings.  No overt pharmacologic contributions from her outpatient medications.   Medical management thus far has included albuterol nebulizer, calcium gluconate 1 g IV x 1, IV NovoLog, sodium bicarbonate 50 mcg IV x 1, Lokelma 10 g p.o. x 1,  as well as IV fluids, as quantified above.  Nephrology consulted for additional recommendations, as above.  Will also pursue further evaluation  management of presenting AKI and CKD 4, as above.   Plan: monitor on telemetry. Add-on serum Mg level. Check phosphorus. Recheck BMP at 1 AM, with repeat CMP ordered for the morning.  Recheck serum Mg in the AM. Check ionized calcium level. IVF's, as above.  Nephrology consulted, as above.  Further evaluation management of presenting AKI on CKD 4, as above.  Resumption of scheduled bicarbonate.  Resumption of home scheduled and as needed breathing treatments.             #) Anion gap metabolic acidosis, as outlined presenting CMP, which appears to be resultant from presenting AKI superimposed on CKD 4.  Status post 1 amp of IV sodium bicarbonate as competitive management of associated presenting hyperkalemia.  No overt evidence of underlying infectious process at this time, we will further evaluate for such as detailed below.  Plan: Further evaluation management of AKI and CKD 4, as above.  Check chest x-ray, urinalysis.  Check CPK, INR, salicylate level.  Repeat CMP, CBC in the morning.  Further evaluation management of presenting hyperkalemia, as above.  Monitor oximetry.             #) SIRS criteria present: Presentation associated with leukocytosis, tachycardia, tachypnea.  However, in the absence of e/o underlying infection at this time, criteria for sepsis not currently met.  Will further expand infectious evaluation, as outlined below. suspect non-infectious factors contributing to these SIRS findings, including inflammatory response leading to leukocytosis in the setting of presenting AKI superimposed on CKD 4. patient appears hemodynamically stable at this time.  Consequently, will refrain from initiation of IV antibiotics at this time.    Plan: Repeat CBC with diff in the AM.  Monitor strict I's and O's and daily weights.  Monitor  on telemetry. Refraining from IV abx for now, as above.  Check chest x-ray, urinalysis.  Further evaluation management of presenting AKI on CKD 4, as above.             #) COPD: Documented history thereof, without clinical evidence of acute exacerbation at this time.  This is in the context of a long prior smoking history of greater than 50 pack years, for completely quitting greater than 15 years ago without report of ensuing resumption thereof.  Outpatient respiratory regimen includes the following: Breztri, along with prn Xopenex nebulizer.   Plan: cont outpatient respiratory regimen, as above. Check CMP and serum magnesium level in the AM.  Check serum phosphorus level.            #) GERD: documented h/o such; on Protonix as outpatient.   Plan: continue home PPI.               #) Hyperlipidemia: documented h/o such. On high intensity atorvastatin as outpatient.   Plan: continue home statin.               #) Essential Hypertension: documented h/o such, with outpatient antihypertensive regimen including metoprolol tartrate.  SBP's in the ED today: Low 100s to 110s mmHg, with heart rates in the 70s to low 100s.   Plan: Close monitoring of subsequent BP via routine VS. resume home beta-blocker.  Monitor on telemetry.            #) Restless leg syndrome: Documented history of such, and scheduled workup as an outpatient appears to be as needed setting of chronic iron deficiency anemia/anemia of chronic kidney disease.   Plan: Resume Requip.  Repeat CBC in the morning.  Check INR.            #) Anemia of chronic kidney disease: Documented history of such, a/w with baseline hgb range 8-11, with presenting hgb consistent with this range, in the absence of any overt evidence of active bleed.  Is on daily oral iron supplementation as an outpatient.   Plan: Repeat CBC in the morning.  Check INR.  Type and screen ordered.  Continue home  oral iron supplementation.    DVT prophylaxis: SCD's   Code Status: Full code Family Communication: none Disposition Plan: Per Rounding Team Consults called:  EDP has contacted on-call nephrologist, Dr. Royce Macadamia, requesting formal consultation, as further detailed above;  Admission status: Inpatient     I SPENT GREATER THAN 75  MINUTES IN CLINICAL CARE TIME/MEDICAL DECISION-MAKING IN COMPLETING THIS ADMISSION.      [place] DO [facility]  From [place]   08/03/2022, 8:40 PM

## 2022-08-03 NOTE — Assessment & Plan Note (Addendum)
Check labs 

## 2022-08-04 ENCOUNTER — Inpatient Hospital Stay (HOSPITAL_COMMUNITY): Payer: Medicare Other | Admitting: Anesthesiology

## 2022-08-04 ENCOUNTER — Inpatient Hospital Stay (HOSPITAL_COMMUNITY): Payer: Medicare Other

## 2022-08-04 ENCOUNTER — Encounter (HOSPITAL_COMMUNITY)
Admission: EM | Disposition: A | Discharge status: Home Health | Payer: Self-pay | Source: Home / Self Care | Attending: Internal Medicine

## 2022-08-04 ENCOUNTER — Encounter (HOSPITAL_COMMUNITY): Payer: Self-pay | Admitting: Internal Medicine

## 2022-08-04 DIAGNOSIS — N179 Acute kidney failure, unspecified: Secondary | ICD-10-CM | POA: Diagnosis not present

## 2022-08-04 DIAGNOSIS — N189 Chronic kidney disease, unspecified: Secondary | ICD-10-CM

## 2022-08-04 DIAGNOSIS — I130 Hypertensive heart and chronic kidney disease with heart failure and stage 1 through stage 4 chronic kidney disease, or unspecified chronic kidney disease: Secondary | ICD-10-CM

## 2022-08-04 DIAGNOSIS — N184 Chronic kidney disease, stage 4 (severe): Secondary | ICD-10-CM | POA: Diagnosis not present

## 2022-08-04 DIAGNOSIS — Z87891 Personal history of nicotine dependence: Secondary | ICD-10-CM

## 2022-08-04 DIAGNOSIS — N1330 Unspecified hydronephrosis: Secondary | ICD-10-CM | POA: Diagnosis not present

## 2022-08-04 DIAGNOSIS — D630 Anemia in neoplastic disease: Secondary | ICD-10-CM

## 2022-08-04 DIAGNOSIS — I509 Heart failure, unspecified: Secondary | ICD-10-CM | POA: Diagnosis not present

## 2022-08-04 HISTORY — PX: CYSTOSCOPY WITH RETROGRADE PYELOGRAM, URETEROSCOPY AND STENT PLACEMENT: SHX5789

## 2022-08-04 LAB — LACTIC ACID, PLASMA: Lactic Acid, Venous: 0.6 mmol/L (ref 0.5–1.9)

## 2022-08-04 LAB — BASIC METABOLIC PANEL
Anion gap: 15 (ref 5–15)
Anion gap: 13 (ref 5–15)
BUN: 81 mg/dL — ABNORMAL HIGH (ref 8–23)
BUN: 79 mg/dL — ABNORMAL HIGH (ref 8–23)
CO2: 19 mmol/L — ABNORMAL LOW (ref 22–32)
CO2: 20 mmol/L — ABNORMAL LOW (ref 22–32)
Calcium: 7.7 mg/dL — ABNORMAL LOW (ref 8.9–10.3)
Calcium: 7.6 mg/dL — ABNORMAL LOW (ref 8.9–10.3)
Chloride: 98 mmol/L (ref 98–111)
Chloride: 101 mmol/L (ref 98–111)
Creatinine, Ser: 6.87 mg/dL — ABNORMAL HIGH (ref 0.44–1.00)
Creatinine, Ser: 6.61 mg/dL — ABNORMAL HIGH (ref 0.44–1.00)
GFR, Estimated: 6 mL/min — ABNORMAL LOW (ref >60)
GFR, Estimated: 6 mL/min — ABNORMAL LOW (ref >60)
Glucose, Bld: 104 mg/dL — ABNORMAL HIGH (ref 70–99)
Glucose, Bld: 104 mg/dL — ABNORMAL HIGH (ref 70–99)
Potassium: 5.2 mmol/L — ABNORMAL HIGH (ref 3.5–5.1)
Potassium: 5.7 mmol/L — ABNORMAL HIGH (ref 3.5–5.1)
Sodium: 132 mmol/L — ABNORMAL LOW (ref 135–145)
Sodium: 134 mmol/L — ABNORMAL LOW (ref 135–145)

## 2022-08-04 LAB — URINALYSIS, COMPLETE (UACMP) WITH MICROSCOPIC
Bilirubin Urine: NEGATIVE
Glucose, UA: NEGATIVE mg/dL
Ketones, ur: NEGATIVE mg/dL
Nitrite: NEGATIVE
Protein, ur: 100 mg/dL — AB
Specific Gravity, Urine: 1.01 (ref 1.005–1.030)
WBC, UA: >50 WBC/hpf (ref 0–5)
pH: 5 (ref 5.0–8.0)

## 2022-08-04 LAB — COMPREHENSIVE METABOLIC PANEL
ALT: 16 U/L (ref 0–44)
AST: 18 U/L (ref 15–41)
Albumin: 2.1 g/dL — ABNORMAL LOW (ref 3.5–5.0)
Alkaline Phosphatase: 65 U/L (ref 38–126)
Anion gap: 15 (ref 5–15)
BUN: 85 mg/dL — ABNORMAL HIGH (ref 8–23)
CO2: 19 mmol/L — ABNORMAL LOW (ref 22–32)
Calcium: 7.9 mg/dL — ABNORMAL LOW (ref 8.9–10.3)
Chloride: 101 mmol/L (ref 98–111)
Creatinine, Ser: 6.81 mg/dL — ABNORMAL HIGH (ref 0.44–1.00)
GFR, Estimated: 6 mL/min — ABNORMAL LOW (ref >60)
Glucose, Bld: 105 mg/dL — ABNORMAL HIGH (ref 70–99)
Potassium: 5 mmol/L (ref 3.5–5.1)
Sodium: 135 mmol/L (ref 135–145)
Total Bilirubin: 0.6 mg/dL (ref 0.3–1.2)
Total Protein: 5.2 g/dL — ABNORMAL LOW (ref 6.5–8.1)

## 2022-08-04 LAB — CBC WITH DIFFERENTIAL/PLATELET
Abs Immature Granulocytes: 0.28 10*3/uL — ABNORMAL HIGH (ref 0.00–0.07)
Basophils Absolute: 0 10*3/uL (ref 0.0–0.1)
Basophils Relative: 0 %
Eosinophils Absolute: 0 10*3/uL (ref 0.0–0.5)
Eosinophils Relative: 0 %
HCT: 23.7 % — ABNORMAL LOW (ref 36.0–46.0)
Hemoglobin: 7.9 g/dL — ABNORMAL LOW (ref 12.0–15.0)
Immature Granulocytes: 2 %
Lymphocytes Relative: 8 %
Lymphs Abs: 1.5 10*3/uL (ref 0.7–4.0)
MCH: 31.5 pg (ref 26.0–34.0)
MCHC: 33.3 g/dL (ref 30.0–36.0)
MCV: 94.4 fL (ref 80.0–100.0)
Monocytes Absolute: 1.9 10*3/uL — ABNORMAL HIGH (ref 0.1–1.0)
Monocytes Relative: 10 %
Neutro Abs: 14.8 10*3/uL — ABNORMAL HIGH (ref 1.7–7.7)
Neutrophils Relative %: 80 %
Platelets: 235 10*3/uL (ref 150–400)
RBC: 2.51 MIL/uL — ABNORMAL LOW (ref 3.87–5.11)
RDW: 17.4 % — ABNORMAL HIGH (ref 11.5–15.5)
WBC: 18.6 10*3/uL — ABNORMAL HIGH (ref 4.0–10.5)
nRBC: 0 % (ref 0.0–0.2)

## 2022-08-04 LAB — PROTEIN / CREATININE RATIO, URINE
Creatinine, Urine: 70 mg/dL
Protein Creatinine Ratio: 2.1 mg/mg{Cre} — ABNORMAL HIGH (ref 0.00–0.15)
Total Protein, Urine: 147 mg/dL

## 2022-08-04 LAB — PHOSPHORUS: Phosphorus: 5.5 mg/dL — ABNORMAL HIGH (ref 2.5–4.6)

## 2022-08-04 LAB — PROTIME-INR
INR: 1.1 (ref 0.8–1.2)
Prothrombin Time: 14.4 seconds (ref 11.4–15.2)

## 2022-08-04 LAB — SALICYLATE LEVEL: Salicylate Lvl: <7 mg/dL — ABNORMAL LOW (ref 7.0–30.0)

## 2022-08-04 LAB — SODIUM, URINE, RANDOM: Sodium, Ur: 43 mmol/L

## 2022-08-04 LAB — CREATININE, URINE, RANDOM: Creatinine, Urine: 71 mg/dL

## 2022-08-04 LAB — MRSA NEXT GEN BY PCR, NASAL: MRSA by PCR Next Gen: NOT DETECTED

## 2022-08-04 LAB — MAGNESIUM: Magnesium: 1.7 mg/dL (ref 1.7–2.4)

## 2022-08-04 SURGERY — CYSTOURETEROSCOPY, WITH RETROGRADE PYELOGRAM AND STENT INSERTION
Anesthesia: General | Laterality: Right

## 2022-08-04 MED ORDER — SODIUM CHLORIDE 0.9 % IV SOLN
1.0000 g | INTRAVENOUS | Status: DC
  Administered 2022-08-04: 1 g via INTRAVENOUS
  Filled 2022-08-04: qty 10

## 2022-08-04 MED ORDER — FENTANYL CITRATE (PF) 250 MCG/5ML IJ SOLN
INTRAMUSCULAR | Status: DC | PRN
  Administered 2022-08-04 (×4): 25 ug via INTRAVENOUS

## 2022-08-04 MED ORDER — DEXAMETHASONE SODIUM PHOSPHATE 10 MG/ML IJ SOLN
INTRAMUSCULAR | Status: DC | PRN
  Administered 2022-08-04: 4 mg via INTRAVENOUS

## 2022-08-04 MED ORDER — FENTANYL CITRATE (PF) 250 MCG/5ML IJ SOLN
INTRAMUSCULAR | Status: AC
  Filled 2022-08-04: qty 5

## 2022-08-04 MED ORDER — 0.9 % SODIUM CHLORIDE (POUR BTL) OPTIME
TOPICAL | Status: DC | PRN
  Administered 2022-08-04: 1000 mL

## 2022-08-04 MED ORDER — LIDOCAINE 2% (20 MG/ML) 5 ML SYRINGE
INTRAMUSCULAR | Status: DC | PRN
  Administered 2022-08-04: 80 mg via INTRAVENOUS

## 2022-08-04 MED ORDER — LACTATED RINGERS IV BOLUS
500.0000 mL | Freq: Once | INTRAVENOUS | Status: AC
  Administered 2022-08-04: 500 mL via INTRAVENOUS

## 2022-08-04 MED ORDER — CEFAZOLIN SODIUM 1 G IJ SOLR
INTRAMUSCULAR | Status: AC
  Filled 2022-08-04: qty 20

## 2022-08-04 MED ORDER — CEFAZOLIN SODIUM-DEXTROSE 2-3 GM-%(50ML) IV SOLR
INTRAVENOUS | Status: DC | PRN
  Administered 2022-08-04: 2 g via INTRAVENOUS

## 2022-08-04 MED ORDER — PROPOFOL 10 MG/ML IV BOLUS
INTRAVENOUS | Status: DC | PRN
  Administered 2022-08-04: 120 mg via INTRAVENOUS

## 2022-08-04 MED ORDER — SODIUM CHLORIDE 0.9 % IV SOLN: INTRAVENOUS | Status: DC

## 2022-08-04 MED ORDER — ACETAMINOPHEN 10 MG/ML IV SOLN
INTRAVENOUS | Status: DC | PRN
  Administered 2022-08-04: 1000 mg via INTRAVENOUS

## 2022-08-04 MED ORDER — CHLORHEXIDINE GLUCONATE 0.12 % MT SOLN: 15.0000 mL | Freq: Once | OROMUCOSAL | Status: AC

## 2022-08-04 MED ORDER — SODIUM BICARBONATE 8.4 % IV SOLN
50.0000 meq | Freq: Once | INTRAVENOUS | Status: AC
  Administered 2022-08-04: 50 meq via INTRAVENOUS
  Filled 2022-08-04: qty 50

## 2022-08-04 MED ORDER — ONDANSETRON HCL 4 MG/2ML IJ SOLN
4.0000 mg | Freq: Four times a day (QID) | INTRAMUSCULAR | Status: DC | PRN
  Administered 2022-08-04 – 2022-08-10 (×2): 4 mg via INTRAVENOUS
  Filled 2022-08-04 (×2): qty 2

## 2022-08-04 MED ORDER — CHLORHEXIDINE GLUCONATE 0.12 % MT SOLN
OROMUCOSAL | Status: AC
  Administered 2022-08-04: 15 mL via OROMUCOSAL
  Filled 2022-08-04: qty 15

## 2022-08-04 MED ORDER — ORAL CARE MOUTH RINSE: 15.0000 mL | Freq: Once | OROMUCOSAL | Status: AC

## 2022-08-04 MED ORDER — ACETAMINOPHEN 10 MG/ML IV SOLN
INTRAVENOUS | Status: AC
  Filled 2022-08-04: qty 100

## 2022-08-04 MED ORDER — WATER FOR IRRIGATION, STERILE IR SOLN
Status: DC | PRN
  Administered 2022-08-04: 3000 mL

## 2022-08-04 MED ORDER — PROPOFOL 10 MG/ML IV BOLUS
INTRAVENOUS | Status: AC
  Filled 2022-08-04: qty 20

## 2022-08-04 MED ORDER — PHENYLEPHRINE 80 MCG/ML (10ML) SYRINGE FOR IV PUSH (FOR BLOOD PRESSURE SUPPORT)
PREFILLED_SYRINGE | INTRAVENOUS | Status: DC | PRN
  Administered 2022-08-04 (×2): 80 ug via INTRAVENOUS
  Administered 2022-08-04: 160 ug via INTRAVENOUS

## 2022-08-04 SURGICAL SUPPLY — 24 items
BAG URINE DRAIN 2000ML AR STRL (UROLOGICAL SUPPLIES) ×1 IMPLANT
BAG URO CATCHER STRL LF (MISCELLANEOUS) ×1 IMPLANT
CATH FOLEY 2WAY SLVR  5CC 16FR (CATHETERS)
CATH FOLEY 2WAY SLVR 5CC 16FR (CATHETERS) IMPLANT
CATH URETL OPEN END 6FR 70 (CATHETERS) ×1 IMPLANT
GLOVE BIOGEL M 8.0 STRL (GLOVE) ×1 IMPLANT
GOWN STRL REUS W/ TWL LRG LVL3 (GOWN DISPOSABLE) ×1 IMPLANT
GOWN STRL REUS W/ TWL XL LVL3 (GOWN DISPOSABLE) ×1 IMPLANT
GOWN STRL REUS W/TWL LRG LVL3 (GOWN DISPOSABLE) ×1
GOWN STRL REUS W/TWL XL LVL3 (GOWN DISPOSABLE) ×1
GUIDEWIRE ANG ZIPWIRE 038X150 (WIRE) IMPLANT
GUIDEWIRE STR DUAL SENSOR (WIRE) ×1 IMPLANT
GUIDEWIRE ZIPWRE .038 STRAIGHT (WIRE) IMPLANT
KIT TURNOVER KIT B (KITS) ×1 IMPLANT
MANIFOLD NEPTUNE II (INSTRUMENTS) IMPLANT
NS IRRIG 1000ML POUR BTL (IV SOLUTION) IMPLANT
PACK CYSTO (CUSTOM PROCEDURE TRAY) ×1 IMPLANT
STENT URET 6FRX24 CONTOUR (STENTS) IMPLANT
STENT URET 6FRX26 CONTOUR (STENTS) IMPLANT
STENT URO INLAY 6FRX22CM (STENTS) IMPLANT
SYPHON OMNI JUG (MISCELLANEOUS) ×1 IMPLANT
TOWEL GREEN STERILE FF (TOWEL DISPOSABLE) ×1 IMPLANT
TUBE CONNECTING 12X1/4 (SUCTIONS) IMPLANT
WATER STERILE IRR 3000ML UROMA (IV SOLUTION) ×1 IMPLANT

## 2022-08-04 NOTE — Anesthesia Postprocedure Evaluation (Signed)
Anesthesia Post Note  Patient: Alexandra [name]  Procedure(s) Performed: CYSTOSCOPY WITH RIGHT URETERAL STENT EXCHANGE (Right)     Patient location during evaluation: PACU Anesthesia Type: General Level of consciousness: awake and alert Pain management: pain level controlled Vital Signs Assessment: post-procedure vital signs reviewed and stable Respiratory status: spontaneous breathing, nonlabored ventilation and respiratory function stable Cardiovascular status: blood pressure returned to baseline and stable Postop Assessment: no apparent nausea or vomiting Anesthetic complications: no   No notable events documented.  Last Vitals:  Vitals:   08/04/22 1615 08/04/22 1630  BP: (!) 102/51 (!) 100/51  Pulse: 83 84  Resp: 18 (!) 26  Temp:    SpO2: 96% 97%    Last Pain:  Vitals:   08/04/22 1321  TempSrc: Oral  PainSc:                  Santa Lighter

## 2022-08-04 NOTE — Op Note (Signed)
Operative Note  Preoperative diagnosis:  1.  Right hydronephrosis with stent in place 2. AKI on CKD  Postoperative diagnosis: 1.  same  Procedure(s): 1.  Cystoscopy 2. right ureteral stent placement (6x24) 4. Fluoroscopy <1 hour with intraoperative interpretation  Surgeon: Donald Pore, MD  Assistants:  None  Anesthesia:  General  Complications:  None  EBL:  minimal  Specimens: 1. Urine (bladder via scope)  Drains/Catheters: 1.  Right 6Fr x 24cm ureteral stent  Intraoperative findings:   Cystoscopy demonstrated no suspicious lesions, masses, stones. There was a defect just supero-medial to the right UO that looked to be from a previous TURBT. There was also a good amount of white debris in the bladder. The right UO was somewhat patulous as well I deferred a retrograde pyelogram given her contrast allergy Successful right ureteral stent placement with curl in the renal pelvis and bladder respectively.  Indication:  Alexandra [name] is a 82 y.o. female with the above diagnoses. After reviewing the management options for treatment, she elected to proceed with the above surgical procedure(s). We have discussed the potential benefits and risks of the procedure, side effects of the proposed treatment, the likelihood of the patient achieving the goals of the procedure, and any potential problems that might occur during the procedure or recuperation. Informed consent has been obtained.  Description of procedure: The patient was taken to the operating room and general anesthesia was induced.  The patient was placed in the dorsal lithotomy position, prepped and draped in the usual sterile fashion, and preoperative antibiotics were administered. A preoperative time-out was performed.   Cystourethroscopy was performed.  The patient's urethra was examined and was normal. The bladder was then systematically examined in its entirety. The findings were as above  Attention then turned to the  right ureteral orifice. The stent was grasped and brought out through the meatus. It was cannulated with a A 0.038 zip wire to the level of the renal pelvis.   A 6Fr x 22 cm ureteral stent was advance over the wire. This was the size of her previous stent, and based on the appearance of her CT scan and my findings in the OR, I was worried it may be a little too short. Subsequently, I removed it and replaced it with a 6x24 cm in a similar fashion.  The stent was positioned appropriately under fluoroscopic and cystoscopic guidance.  The wire was then removed with an adequate stent curl noted in the renal pelvis as well as in the bladder.  The bladder was then emptied and the procedure ended.  The patient appeared to tolerate the procedure well and without complications.  The patient was able to be awakened and transferred to the recovery unit in satisfactory condition.   Plan:   --return to floor --trend Cr; if creatinine does not improve, would consider repeat imaging. If hydro persists, patient may benefit from neph tube.   Daryll Brod MD [facility]  Pager: [phone]

## 2022-08-04 NOTE — Discharge Instructions (Signed)

## 2022-08-04 NOTE — Anesthesia Procedure Notes (Signed)
Procedure Name: LMA Insertion Date/Time: 08/04/2022 2:46 PM  Performed by: Clearnce Sorrel, CRNAPre-anesthesia Checklist: Patient identified, Emergency Drugs available, Suction available and Patient being monitored Patient Re-evaluated:Patient Re-evaluated prior to induction Oxygen Delivery Method: Circle System Utilized Preoxygenation: Pre-oxygenation with 100% oxygen Induction Type: IV induction Ventilation: Mask ventilation without difficulty LMA: LMA inserted LMA Size: 4.0 Number of attempts: 1 Airway Equipment and Method: Bite block Placement Confirmation: positive ETCO2 Tube secured with: Tape Dental Injury: Teeth and Oropharynx as per pre-operative assessment

## 2022-08-04 NOTE — Consult Note (Signed)
[facility] Renal Consultation Note  Requesting MD: Lajean Saver, MD Indication for Consultation:  AKI  Chief complaint: decreased urine output and labs worse  HPI:  Alexandra [name] is a 82 y.o. female with a history of CKD stage IV, COPD, hypertension, osteoporosis, renal cell carcinoma status post left total nephrectomy in 1999 who presented to the hospital with decreased urine output and abnormal outpatient labs obtained at [facility].  She was found to have acute kidney injury on blood work with a clinic appointment.  She was also found to have a potassium of 6.7 so was directed to the ER.  Nephrology was consulted.  She received 2 amps bicarbonate, albuterol, insulin/glucose, and lokelma.  Her CT a/p with contrast demonstrated right hydronephrosis despite her stent.  Nephrology requested urology consult.  She follows with Dr. Hollie Salk at [facility].  Creatinine trends are listed below; baseline had been around 3.  She normally lives at home and is ambulatory with her walker.  She is on 2 L of oxygen at baseline and this is her current requirement as well.  She denies any recent nausea vomiting or diarrhea outpatient - states that she had a little nausea a couple of hours ago after a medication.    Creatinine  Date/Time Value Ref Range Status  06/13/2022 10:21 AM 3.05 (HH) 0.60 - 1.20 mg/dL Final    Comment:    CRITICAL RESULT CALLED TO, READ BACK BY AND VERIFIED WITH: DR.MCCARTY AT 1952 ON 01.03.24 BY N.THOMPSON   06/06/2022 09:17 AM 2.97 (H) 0.44 - 1.00 mg/dL Final  05/30/2022 08:25 AM 2.58 (H) 0.44 - 1.00 mg/dL Final  05/23/2022 09:19 AM 2.69 (H) 0.44 - 1.00 mg/dL Final  05/21/2022 10:41 AM 2.62 (H) 0.44 - 1.00 mg/dL Final  05/02/2022 11:11 AM 2.86 (H) 0.44 - 1.00 mg/dL Final   Creatinine, Ser  Date/Time Value Ref Range Status  08/04/2022 08:37 AM 6.87 (H) 0.44 - 1.00 mg/dL Final  08/04/2022 02:57 AM 6.81 (H) 0.44 - 1.00 mg/dL Final  08/03/2022 09:07 PM  6.74 (H) 0.44 - 1.00 mg/dL Final  08/03/2022 05:49 PM 6.88 (H) 0.44 - 1.00 mg/dL Final  08/03/2022 10:30 AM 6.84 (HH) 0.57 - 1.00 mg/dL Final    Comment:                      Client Requested [place]  07/31/2022 02:15 PM 3.68 (H) 0.57 - 1.00 mg/dL Final  07/03/2022 04:30 PM 3.58 (H) 0.57 - 1.00 mg/dL Final  04/10/2022 08:22 AM 3.52 (H) 0.57 - 1.00 mg/dL Final  03/27/2022 02:48 PM 2.72 (H) 0.57 - 1.00 mg/dL Final  02/27/2022 08:07 AM 2.54 (H) 0.57 - 1.00 mg/dL Final  11/23/2021 08:00 AM 1.95 (H) 0.57 - 1.00 mg/dL Final  10/24/2021 08:36 AM 2.12 (H) 0.57 - 1.00 mg/dL Final  08/24/2021 11:35 AM 2.44 (H) 0.57 - 1.00 mg/dL Final  08/17/2021 08:31 AM 2.84 (H) 0.57 - 1.00 mg/dL Final  07/20/2021 08:32 AM 2.02 (H) 0.57 - 1.00 mg/dL Final  04/04/2021 08:09 AM 1.69 (H) 0.57 - 1.00 mg/dL Final  01/18/2021 10:13 AM 1.97 (H) 0.57 - 1.00 mg/dL Final  09/26/2020 07:58 AM 1.81 (H) 0.57 - 1.00 mg/dL Final  03/28/2020 07:53 AM 1.91 (H) 0.57 - 1.00 mg/dL Final  11/24/2019 12:00 AM 1.68 (H) 0.57 - 1.00 mg/dL Final  08/04/2019 07:59 AM 1.90 (H) 0.57 - 1.00 mg/dL Final  02/27/2018 11:42 AM 1.78 (H) 0.57 - 1.00 mg/dL Final  06/26/2016 02:44  PM 1.53 (H) 0.44 - 1.00 mg/dL Final     PMHx:   Past Medical History:  Diagnosis Date   CKD (chronic kidney disease), stage IV (HCC)    COPD (chronic obstructive pulmonary disease) (HCC)    GERD (gastroesophageal reflux disease)    Hyperlipidemia    Hyperparathyroidism, 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 82 yo   BACK SURGERY  2016   lower L 4 to L 5   bladder tack and uterus lift  10/10/2015   CHOLECYSTECTOMY     summer of 2022   IR RADIOLOGIST EVAL & MGMT  12/03/2018   IR RADIOLOGIST EVAL & MGMT  12/31/2018   PARATHYROIDECTOMY Left 07/02/2016   Procedure: LEFT INFERIOR  PARATHYROIDECTOMY;  Surgeon: Armandina Gemma, MD;  Location: [facility]S;  Service: General;  Laterality: Left;   SHOULDER OPEN ROTATOR CUFF REPAIR  12/2019   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    Family Hx:  Family History  Problem Relation Age of Onset   Emphysema Mother    Hyperlipidemia Sister    Hypertension Sister    COPD Sister    Hyperlipidemia Sister    Hypertension Sister    Lymphoma Sister    Obesity Brother    Hyperlipidemia Brother    Hypertension Brother    Breast cancer Neg Hx     Social History:  reports that she quit smoking about 17 years ago. Her smoking use included cigarettes. She has a 50.00 pack-year smoking history. She has never used smokeless tobacco. She reports that she does not drink alcohol and does not use drugs.  Allergies:  Allergies  Allergen Reactions   Contrast Media [Iodinated Contrast Media] Hives, Itching and Rash   Omeprazole Itching   Shellfish Allergy Nausea And Vomiting   Shellfish-Derived Products Nausea And Vomiting    Medications: Prior to Admission medications   Medication Sig Start Date End Date Taking? Authorizing Provider  atorvastatin (LIPITOR) 40 MG tablet Take 1 tablet (40 mg total) by mouth daily. Per ccm 10/24/21  Yes Heaton, Thornell Mule, NP  Budeson-Glycopyrrol-Formoterol (BREZTRI AEROSPHERE) 160-9-4.8 MCG/ACT AERO Inhale 2 puffs into the lungs in the morning and at bedtime. 07/31/22  Yes Cox, Kirsten, MD  budesonide (PULMICORT) 0.5 MG/2ML nebulizer solution Take 0.5 mg by nebulization 2 (two) times daily.   Yes [provider]  Cholecalciferol (VITAMIN D) 125 MCG (5000 UT) CAPS Take 1 capsule by mouth daily.   Yes [provider]  ipratropium (ATROVENT) 0.02 % nebulizer solution Take 0.5 mg by nebulization 2 (two) times daily. 06/22/22  Yes [provider]  levalbuterol (XOPENEX) 1.25 MG/3ML nebulizer solution Take 1.25 mg by nebulization 2 (two) times daily. 06/22/22  Yes [provider]   metoprolol succinate (TOPROL-XL) 25 MG 24 hr tablet Take 25 mg by mouth daily. 07/28/22  Yes [provider]  nitroGLYCERIN (NITROSTAT) 0.4 MG SL tablet Place 1 tablet (0.4 mg total) under the tongue every 5 (five) minutes as needed for chest pain. 07/13/22  Yes Aryal, Palma Holter, FNP  ondansetron (ZOFRAN-ODT) 4 MG disintegrating tablet Take 1 tablet (4 mg total) by mouth every 8 (eight) hours as needed for nausea or vomiting. 07/31/22  Yes Cox, Kirsten, MD  polyethylene glycol powder (GLYCOLAX/MIRALAX) 17 GM/SCOOP powder Take 17 g  by mouth daily as needed for mild constipation. 06/29/22  Yes [provider]  rOPINIRole (REQUIP) 2 MG tablet TAKE 1 TABLET(2 MG) BY MOUTH IN THE MORNING AND AT BEDTIME Patient taking differently: Take 2 mg by mouth 2 (two) times daily. 11/28/21  Yes Rip Harbour, NP  torsemide (DEMADEX) 20 MG tablet Take 20 mg by mouth daily as needed (edema).   Yes [provider]  VENTOLIN HFA 108 (90 Base) MCG/ACT inhaler Inhale 1-2 puffs into the lungs every 6 (six) hours as needed for wheezing or shortness of breath. 07/31/22  Yes Cox, Kirsten, MD    I have reviewed the patient's current and reported prior to admission medications.  Labs:     Latest Ref Rng & Units 08/04/2022    8:37 AM 08/04/2022    2:57 AM 08/03/2022    9:07 PM  BMP  Glucose 70 - 99 mg/dL 104  105  95   BUN 8 - 23 mg/dL 81  85  84   Creatinine 0.44 - 1.00 mg/dL 6.87  6.81  6.74   Sodium 135 - 145 mmol/L 132  135  133   Potassium 3.5 - 5.1 mmol/L 5.2  5.0  5.0   Chloride 98 - 111 mmol/L 98  101  102   CO2 22 - 32 mmol/L '19  19  16   '$ Calcium 8.9 - 10.3 mg/dL 7.7  7.9  8.1     Urinalysis    Component Value Date/Time   COLORURINE YELLOW 08/04/2022 0257   APPEARANCEUR HAZY (A) 08/04/2022 0257   LABSPEC 1.010 08/04/2022 0257   PHURINE 5.0 08/04/2022 0257   GLUCOSEU NEGATIVE 08/04/2022 0257   HGBUR MODERATE (A) 08/04/2022 0257   BILIRUBINUR NEGATIVE 08/04/2022 0257   KETONESUR  NEGATIVE 08/04/2022 0257   PROTEINUR 100 (A) 08/04/2022 0257   NITRITE NEGATIVE 08/04/2022 0257   LEUKOCYTESUR LARGE (A) 08/04/2022 0257     ROS:  Pertinent items noted in HPI and remainder of comprehensive ROS otherwise negative.  Physical Exam: Vitals:   08/04/22 0915 08/04/22 1001  BP: (!) 122/50 (!) 111/56  Pulse: (!) 105 (!) 106  Resp: 18 20  Temp:  97.9 F (36.6 C)  SpO2: 96% 96%     General: Elderly female in stretcher in no acute distress at rest HEENT: Normocephalic atraumatic Eyes: Extraocular movements intact sclera anicteric Neck: Supple trachea midline Heart: S1-S2 tachycardic no rub Lungs: Clear on auscultation normal work of breathing at rest on 2 L oxygen Abdomen: Soft slightly tender on the right upper quadrant nondistended.  Extremities: Trace edema lower extremities no cyanosis or clubbing Skin: No rash on extremities exposed Psych:  normal mood and affect Neuro: Alert and oriented x 3 provides a history and follows commands Genitourinary no Foley  Assessment/Plan:  # Acute kidney injury secondary to obstructive uropathy - setting of solitary kidney as below - Urology is taking OR for stent exchange today  - Please avoid NSAID's peri-operatively   - she is s/p fluids in the ER without improvement; note she reports a history of heart failure  # Obstructive uropathy - She has hydronephrosis despite stent - urology is exchanging the stent today  # Hyperkalemia - Temporized in the ER - 1 amp bicarbonate ordered this afternoon to further optimize   # Metabolic acidosis - secondary to AKI  - continue sodium bicarbonate   # Solitary kidney  - note hx of remote left total nephrectomy for renal cell  # CKD stage IV -  Baseline Cr near 3 and follows with [facility]   # Hypertension - Acceptable control  # COPD - note home oxygen requirement of 2 liters   Disposition - continue inpatient monitoring   Claudia Desanctis 08/04/2022, 1:57 PM

## 2022-08-04 NOTE — Transfer of Care (Signed)
Immediate Anesthesia Transfer of Care Note  Patient: Alexandra [name]  Procedure(s) Performed: CYSTOSCOPY WITH RIGHT URETERAL STENT EXCHANGE (Right)  Patient Location: PACU  Anesthesia Type:General  Level of Consciousness: awake, alert , and oriented  Airway & Oxygen Therapy: Patient Spontanous Breathing and Patient connected to nasal cannula oxygen  Post-op Assessment: Report given to RN and Post -op Vital signs reviewed and stable  Post vital signs: Reviewed and stable  Last Vitals:  Vitals Value Taken Time  BP    Temp    Pulse    Resp    SpO2      Last Pain:  Vitals:   08/04/22 1321  TempSrc: Oral  PainSc:          Complications: No notable events documented.

## 2022-08-04 NOTE — Progress Notes (Signed)
PROGRESS NOTE  Alexandra [name]  DOB: 1940-10-16  PCP: Rochel Brome, MD SJ:[id number]  DOA: 08/03/2022  LOS: 1 day  Hospital Day: 2  Brief narrative: Alexandra [name] is a 82 y.o. female with PMH significant for HTN, HLD, CKD 4, COPD, GERD, restless legs, per parathyroidism, RCC s/p left total nephrectomy 1999, anemia chronic disease. 2/23, patient was sent to the ED after noted to have elevated creatinine and potassium level in outpatient lab done on 2/20.  Follows up with nephrologist Dr. Hollie Salk.  No recent symptoms.  In the ED, patient was afebrile, hemodynamically stable with blood pressure in 100s Initial labs with sodium 132, potassium 6.7, BUN/creatinine 80/6.84, WC count elevated to 24,000, hemoglobin low at 9.2 EDP discussed with nephrologist Dr. Reeves Dam.  Patient received temporizing measures for hyperkalemia with sodium bicarb, insulin, calcium gluconate, Lokelma.  Urinalysis showed hazy yellow urine, moderate hemoglobin, large leukocytes, rare bacteria. Ultrasound renal was obtained which showed a right sided ureteral stent with moderate severity right-sided hydronephrosis and proximal hydroureter. CT renal study confirmed the finding suggesting stent malfunction.  Also suggested possible right pyelonephritis. Urology consultation was obtained.  Admitted to [facility]    Subjective: Patient was seen and examined this morning.  Pleasant elderly Caucasian female.  Lying on bed.  Not in pain.  Looks weak. Overnight, afebrile, but tachycardic to 100s, blood pressure in low normal range Last set of labs from this morning with potassium improved to 5, creatinine at 6.81, WC count 18.6, hemoglobin 7.9  Assessment and plan: Acute hyperkalemia AKI on CKD 4 Anion gap metabolic acidosis Sent for significant rise in potassium and creatinine level and outpatient blood work. Labs in the ED found severe hyperkalemia at 6.7 and creatinine elevated to 6.88 EDP discussed with nephrologist Dr.  Reeves Dam.  Patient received temporizing measures for hyperkalemia with sodium bicarb, insulin, calcium gluconate, Lokelma. Patient received fluid boluses in the ED.  Currently not on IV fluid..  Defer to nephrology. Continue sodium bicarb 650 mg 3 times daily, Acute rise in creatinine was likely related to right hydronephrosis in the setting of solitary right kidney. Recent Labs  Lab 07/31/22 1415 08/03/22 1030 08/03/22 1749 08/03/22 2107 08/04/22 0257  NA 136 132* 130* 133* 135  K 6.3* 6.7* 6.4* 5.0 5.0  CL 99 100 99 102 101  CO2 18* 17* 15* 16* 19*  GLUCOSE 100* 96 101* 95 105*  BUN 56* 84* 86* 84* 85*  CREATININE 3.68* 6.84* 6.88* 6.74* 6.81*  CALCIUM 8.8 8.1* 7.9* 8.1* 7.9*  MG  --   --   --  1.7 1.7  PHOS  --   --   --   --  5.5*   Sepsis secondary to acute right pyelonephritis Urinalysis showed large leukocytes WBC count significantly elevated to 24,000 CT scan suggestive of acute pyonephritis Obtain lactic acid level, urine culture, blood culture. Start IV Rocephin. Recent Labs  Lab 07/31/22 1415 08/03/22 1749 08/04/22 0257  WBC 11.6* 23.9* 18.6*   Obstructive uropathy Right hydronephrosis with stent in place Urology consult obtained.  May need OR for stent exchange  Acute on chronic anemia Baseline hemoglobin more than 10.  Lately running low.  Noted a drop in hemoglobin to 7.9 this morning.  No evidence of active bleeding.   Continue iron supplement,  Continue to monitor. Recent Labs    06/13/22 1021 07/03/22 1630 07/31/22 1415 08/03/22 1749 08/04/22 0257  HGB 7.4* 10.2* 9.8* 9.2* 7.9*  MCV 101.7* 95 94 96.3 94.4  Essential hypertension Blood pressure running low normal. PTA on metoprolol tartrate 25 mg twice daily,  COPD Past history of smoking COPD.  Continue bronchodilators as needed.   GERD PPI  Hyperlipidemia Lipitor  Restless leg syndrome Continue Requip.      Mobility: PT eval postprocedure  Goals of care   Code Status: Full  Code     DVT prophylaxis:  SCDs Start: 08/03/22 1957   Antimicrobials: IV Rocephin Fluid: Per nephrology Consultants: Nephrology, urology Family Communication: None at bedside  Status is: Inpatient Level of care: Progressive   Dispo: Patient is from: Home              Anticipated d/c is to: Pending clinical course Continue in-hospital care because: Likely OR today, pending improvement in renal function.   Scheduled Meds:  atorvastatin  40 mg Oral Daily   ferrous sulfate  325 mg Oral Q breakfast   fluticasone furoate-vilanterol  1 puff Inhalation Daily   metoprolol tartrate  25 mg Oral BID   pantoprazole  40 mg Oral Daily   rOPINIRole  2 mg Oral BID   sodium bicarbonate  650 mg Oral TID   umeclidinium bromide  1 puff Inhalation Daily    PRN meds: acetaminophen **OR** acetaminophen, levalbuterol, melatonin, ondansetron (ZOFRAN) IV   Infusions:   cefTRIAXone (ROCEPHIN)  IV      Diet:  Diet Order             Diet NPO time specified  Diet effective now                   Antimicrobials: Anti-infectives (From admission, onward)    Start     Dose/Rate Route Frequency Ordered Stop   08/04/22 0915  cefTRIAXone (ROCEPHIN) 1 g in sodium chloride 0.9 % 100 mL IVPB        1 g 200 mL/hr over 30 Minutes Intravenous Every 24 hours 08/04/22 0904 08/09/22 0914       Skin assessment:       Nutritional status:  Body mass index is 26.85 kg/m.          Objective: Vitals:   08/04/22 0805 08/04/22 0830  BP: (!) 110/53 (!) 106/50  Pulse: (!) 116 (!) 108  Resp: 18 18  Temp:    SpO2: 91% 96%    Intake/Output Summary (Last 24 hours) at 08/04/2022 0905 Last data filed at 08/04/2022 0503 Gross per 24 hour  Intake 134.31 ml  Output --  Net 134.31 ml   Filed Weights   08/03/22 1611  Weight: 66.6 kg   Weight change:  Body mass index is 26.85 kg/m.   Physical Exam: General exam: Pleasant, elderly Caucasian female.  Not in pain Skin: No rashes, lesions  or ulcers. HEENT: Atraumatic, normocephalic, no obvious bleeding Lungs: Clear to auscultation bilaterally CVS: Regular rate and rhythm, no murmur GI/Abd soft nontender, nondistended, bowel sound present CNS: Drowsy, opens eyes to answer questions.  Oriented x 3 Psychiatry: Sad affect Extremities: No pedal edema, no calf tenderness  Data Review: I have personally reviewed the laboratory data and studies available.  F/u labs ordered Unresulted Labs (From admission, onward)     Start     Ordered   08/04/22 0906  Lactic acid, plasma  ONCE - STAT,   R        08/04/22 0905   08/04/22 0906  Culture, blood (Routine X 2) w Reflex to ID Panel  BLOOD CULTURE X 2,   R  08/04/22 0905   08/04/22 0906  Urinalysis, w/ Reflex to Culture (Infection Suspected) -Urine, Clean Catch  (Urine Labs)  Once,   R       Question:  Specimen Source  Answer:  Urine, Clean Catch   08/04/22 0905   08/04/22 [id number]  Basic metabolic panel  ONCE - STAT,   STAT        08/04/22 0813   08/04/22 [id number]  Basic metabolic panel  Once-Timed,   TIMED        08/03/22 1958   [id number]  Salicylate level  Add-on,   AD        08/03/22 2036            Total time spent in review of labs and imaging, patient evaluation, formulation of plan, documentation and communication with family: 42 minutes  Signed, Terrilee Croak, MD [facility] 08/04/2022

## 2022-08-04 NOTE — Anesthesia Preprocedure Evaluation (Signed)
Anesthesia Evaluation  Patient identified by MRN, date of birth, ID band Patient awake    Reviewed: Allergy & Precautions, NPO status , Patient's Chart, lab work & pertinent test results, reviewed documented beta blocker date and time   Airway Mallampati: II  TM Distance: >3 FB Neck ROM: Full    Dental  (+) Dental Advisory Given, Edentulous Upper, Edentulous Lower   Pulmonary COPD,  COPD inhaler, former smoker   Pulmonary exam normal breath sounds clear to auscultation       Cardiovascular hypertension, Pt. on home beta blockers +CHF  Normal cardiovascular exam Rhythm:Regular Rate:Normal     Neuro/Psych negative neurological ROS     GI/Hepatic Neg liver ROS,GERD  ,,  Endo/Other  negative endocrine ROS    Renal/GU Renal InsufficiencyRenal disease (K+ 5.2)Kidney stone   RCC s/p left total nephrectomy 1999     Musculoskeletal negative musculoskeletal ROS (+)    Abdominal   Peds  Hematology  (+) Blood dyscrasia, anemia   Anesthesia Other Findings Day of surgery medications reviewed with the patient.  Reproductive/Obstetrics                              Anesthesia Physical Anesthesia Plan  ASA: 4  Anesthesia Plan: General   Post-op Pain Management: Ofirmev IV (intra-op)*   Induction: Intravenous  PONV Risk Score and Plan: 4 or greater and Dexamethasone and Ondansetron  Airway Management Planned: LMA  Additional Equipment:   Intra-op Plan:   Post-operative Plan: Extubation in OR  Informed Consent: I have reviewed the patients History and Physical, chart, labs and discussed the procedure including the risks, benefits and alternatives for the proposed anesthesia with the patient or authorized representative who has indicated his/her understanding and acceptance.     Dental advisory given  Plan Discussed with: CRNA  Anesthesia Plan Comments:          Anesthesia Quick  Evaluation

## 2022-08-04 NOTE — Interval H&P Note (Signed)
History and Physical Interval Note:  08/04/2022 2:19 PM  Alexandra [name]  has presented today for surgery, with the diagnosis of Kidney stone.  The various methods of treatment have been discussed with the patient and family. After consideration of risks, benefits and other options for treatment, the patient has consented to  Procedure(s): CYSTOSCOPY WITH RETROGRADE PYELOGRAM, URETEROSCOPY AND STENT PLACEMENT (Right) as a surgical intervention.  The patient's history has been reviewed, patient examined, no change in status, stable for surgery.  I have reviewed the patient's chart and labs.  Questions were answered to the patient's satisfaction.     Eunice Oldaker L Callahan Wild

## 2022-08-05 ENCOUNTER — Encounter (HOSPITAL_COMMUNITY): Payer: Self-pay | Admitting: Urology

## 2022-08-05 DIAGNOSIS — N184 Chronic kidney disease, stage 4 (severe): Secondary | ICD-10-CM | POA: Diagnosis not present

## 2022-08-05 DIAGNOSIS — J189 Pneumonia, unspecified organism: Secondary | ICD-10-CM | POA: Insufficient documentation

## 2022-08-05 DIAGNOSIS — Z9981 Dependence on supplemental oxygen: Secondary | ICD-10-CM | POA: Insufficient documentation

## 2022-08-05 DIAGNOSIS — N179 Acute kidney failure, unspecified: Secondary | ICD-10-CM

## 2022-08-05 DIAGNOSIS — J9611 Chronic respiratory failure with hypoxia: Secondary | ICD-10-CM | POA: Insufficient documentation

## 2022-08-05 DIAGNOSIS — D509 Iron deficiency anemia, unspecified: Secondary | ICD-10-CM | POA: Insufficient documentation

## 2022-08-05 LAB — BLOOD CULTURE ID PANEL (REFLEXED) - BCID2

## 2022-08-05 LAB — RENAL FUNCTION PANEL
Albumin: 2 g/dL — ABNORMAL LOW (ref 3.5–5.0)
Anion gap: 14 (ref 5–15)
BUN: 81 mg/dL — ABNORMAL HIGH (ref 8–23)
CO2: 20 mmol/L — ABNORMAL LOW (ref 22–32)
Calcium: 7.6 mg/dL — ABNORMAL LOW (ref 8.9–10.3)
Chloride: 101 mmol/L (ref 98–111)
Creatinine, Ser: 6.57 mg/dL — ABNORMAL HIGH (ref 0.44–1.00)
GFR, Estimated: 6 mL/min — ABNORMAL LOW (ref >60)
Glucose, Bld: 202 mg/dL — ABNORMAL HIGH (ref 70–99)
Phosphorus: 6.3 mg/dL — ABNORMAL HIGH (ref 2.5–4.6)
Potassium: 5.6 mmol/L — ABNORMAL HIGH (ref 3.5–5.1)
Sodium: 135 mmol/L (ref 135–145)

## 2022-08-05 LAB — IRON AND TIBC
Iron: 77 ug/dL (ref 28–170)
Saturation Ratios: 40 % — ABNORMAL HIGH (ref 10.4–31.8)
TIBC: 190 ug/dL — ABNORMAL LOW (ref 250–450)
UIBC: 113 ug/dL

## 2022-08-05 LAB — FERRITIN: Ferritin: 883 ng/mL — ABNORMAL HIGH (ref 11–307)

## 2022-08-05 MED ORDER — SODIUM BICARBONATE 8.4 % IV SOLN
50.0000 meq | Freq: Once | INTRAVENOUS | Status: AC
  Administered 2022-08-05: 50 meq via INTRAVENOUS
  Filled 2022-08-05: qty 50

## 2022-08-05 MED ORDER — SODIUM CHLORIDE 0.9 % IV SOLN
2.0000 g | Freq: Two times a day (BID) | INTRAVENOUS | Status: DC
  Administered 2022-08-05 – 2022-08-07 (×6): 2 g via INTRAVENOUS
  Filled 2022-08-05 (×7): qty 2000

## 2022-08-05 MED ORDER — SODIUM ZIRCONIUM CYCLOSILICATE 10 G PO PACK
10.0000 g | PACK | Freq: Once | ORAL | Status: AC
  Administered 2022-08-05: 10 g via ORAL
  Filled 2022-08-05: qty 1

## 2022-08-05 MED ORDER — SODIUM CHLORIDE 0.9 % IV SOLN: INTRAVENOUS | Status: AC

## 2022-08-05 MED ORDER — METOPROLOL TARTRATE 12.5 MG HALF TABLET
12.5000 mg | ORAL_TABLET | Freq: Two times a day (BID) | ORAL | Status: DC
  Administered 2022-08-05 – 2022-08-13 (×16): 12.5 mg via ORAL
  Filled 2022-08-05 (×16): qty 1

## 2022-08-05 NOTE — Assessment & Plan Note (Signed)
Check cmp.  Put on kayexylate 15 mg four times a day x 3 days and returning on Friday for stat potassium.

## 2022-08-05 NOTE — Assessment & Plan Note (Signed)
Completed antibiotics 

## 2022-08-05 NOTE — Progress Notes (Signed)
PHARMACY - PHYSICIAN COMMUNICATION CRITICAL VALUE ALERT - BLOOD CULTURE IDENTIFICATION (BCID)  Alexandra [name] is an 82 y.o. female who presented to [facility] on 08/03/2022 with a chief complaint of AKI s/p cystoscopy and stent placement  Assessment:   2/2 blood cultures growing Enterococcus faecalis  Name of physician (or Provider) Contacted:  Dr. Nevada Crane  Current antibiotics:  Rocephin  Changes to prescribed antibiotics recommended:  Change to Ampicillin 2 IV q12h   F/U renal function and adjust as needed   Results for orders placed or performed during the hospital encounter of 08/03/22  Blood Culture ID Panel (Reflexed) (Collected: 08/04/2022  8:35 AM)  Result Value Ref Range   Enterococcus faecalis DETECTED (A) NOT DETECTED   Enterococcus Faecium NOT DETECTED NOT DETECTED   Listeria monocytogenes NOT DETECTED NOT DETECTED   Staphylococcus species NOT DETECTED NOT DETECTED   Staphylococcus aureus (BCID) NOT DETECTED NOT DETECTED   Staphylococcus epidermidis NOT DETECTED NOT DETECTED   Staphylococcus lugdunensis NOT DETECTED NOT DETECTED   Streptococcus species NOT DETECTED NOT DETECTED   Streptococcus agalactiae NOT DETECTED NOT DETECTED   Streptococcus pneumoniae NOT DETECTED NOT DETECTED   Streptococcus pyogenes NOT DETECTED NOT DETECTED   A.calcoaceticus-baumannii NOT DETECTED NOT DETECTED   Bacteroides fragilis NOT DETECTED NOT DETECTED   Enterobacterales NOT DETECTED NOT DETECTED   Enterobacter cloacae complex NOT DETECTED NOT DETECTED   Escherichia coli NOT DETECTED NOT DETECTED   Klebsiella aerogenes NOT DETECTED NOT DETECTED   Klebsiella oxytoca NOT DETECTED NOT DETECTED   Klebsiella pneumoniae NOT DETECTED NOT DETECTED   Proteus species NOT DETECTED NOT DETECTED   Salmonella species NOT DETECTED NOT DETECTED   Serratia marcescens NOT DETECTED NOT DETECTED   Haemophilus influenzae NOT DETECTED NOT DETECTED   Neisseria meningitidis NOT DETECTED NOT DETECTED    Pseudomonas aeruginosa NOT DETECTED NOT DETECTED   Stenotrophomonas maltophilia NOT DETECTED NOT DETECTED   Candida albicans NOT DETECTED NOT DETECTED   Candida auris NOT DETECTED NOT DETECTED   Candida glabrata NOT DETECTED NOT DETECTED   Candida krusei NOT DETECTED NOT DETECTED   Candida parapsilosis NOT DETECTED NOT DETECTED   Candida tropicalis NOT DETECTED NOT DETECTED   Cryptococcus neoformans/gattii NOT DETECTED NOT DETECTED   Vancomycin resistance NOT DETECTED NOT DETECTED    Caryl Pina 08/05/2022  4:05 AM

## 2022-08-05 NOTE — Assessment & Plan Note (Signed)
Continue oxygen 2 L daily.

## 2022-08-05 NOTE — Assessment & Plan Note (Signed)
Management per specialist.  Check cmp.

## 2022-08-05 NOTE — Progress Notes (Signed)
PROGRESS NOTE  [name]  DOB: 1941-01-05  PCP: Rochel Brome, MD DL:[id number]  DOA: 08/03/2022  LOS: 2 days  Hospital Day: 3  Brief narrative: Alexandra [name] is a 82 y.o. female with PMH significant for HTN, HLD, CKD 4, COPD, GERD, restless legs, per parathyroidism, RCC s/p left total nephrectomy 1999, anemia chronic disease. 2/23, patient was sent to the ED after noted to have elevated creatinine and potassium level in outpatient lab done on 2/20.  Follows up with nephrologist Dr. Hollie Salk.  No recent symptoms.  In the ED, patient was afebrile, hemodynamically stable with blood pressure in 100s Initial labs with sodium 132, potassium 6.7, BUN/creatinine 80/6.84, WC count elevated to 24,000, hemoglobin low at 9.2 EDP discussed with nephrologist Dr. Royce Macadamia.  Patient received temporizing measures for hyperkalemia with sodium bicarb, insulin, calcium gluconate, Lokelma.  Urinalysis showed hazy yellow urine, moderate hemoglobin, large leukocytes, rare bacteria. Ultrasound renal was obtained which showed a right sided ureteral stent with moderate severity right-sided hydronephrosis and proximal hydroureter. CT renal study confirmed the finding suggesting stent malfunction.  Also suggested possible right pyelonephritis. Urology consultation was obtained.  Admitted to [facility] Blood cultures sent on admission is growing Enterococcus faecalis in both bottles.  Subjective: Patient was seen and examined this morning.  Elderly Caucasian female.  Lying in bed.  On low-flow oxygen.  More awake and conversational than yesterday.  Not in distress.  Assessment and plan: Acute hyperkalemia AKI on CKD 4 Anion gap metabolic acidosis Sent for significant rise in potassium and creatinine level and outpatient blood work. Labs in the ED found severe hyperkalemia at 6.7 and creatinine elevated to 6.88. Acute renal failure and hyperkalemia likely secondary to obstructive uropathy in the setting of solitary  right kidney. Initially given to bridging additional hyperkalemia with improvement. Lab trend as below.  Creatinine slightly better.  Potassium elevated again at 5.6.  1 dose of Lokelma given this morning. Defer to nephrology for IV hydration. Continue sodium bicarb 650 mg 3 times daily. Recent Labs  Lab 08/03/22 2107 08/04/22 0257 08/04/22 0837 08/04/22 1810 08/05/22 0018  NA 133* 135 132* 134* 135  K 5.0 5.0 5.2* 5.7* 5.6*  CL 102 101 98 101 101  CO2 16* 19* 19* 20* 20*  GLUCOSE 95 105* 104* 104* 202*  BUN 84* 85* 81* 79* 81*  CREATININE 6.74* 6.81* 6.87* 6.61* 6.57*  CALCIUM 8.1* 7.9* 7.7* 7.6* 7.6*  MG 1.7 1.7  --   --   --   PHOS  --  5.5*  --   --  6.3*   Sepsis secondary to acute right pyelonephritis Enterococcus faecalis bacteremia Urinalysis showed large leukocytes WBC count significantly elevated to 24,000 CT scan suggestive of acute pyonephritis She was initially started on IV Rocephin but with gram-positive bacteria isolated in blood culture, antibiotic was switched to IV ampicillin. WBC count improving.  Lactic acid level normal. Obtain echocardiogram.  ID consulted Recent Labs  Lab 07/31/22 1415 08/03/22 1749 08/04/22 0257 08/04/22 0940  WBC 11.6* 23.9* 18.6*  --   LATICACIDVEN  --   --   --  0.6   Obstructive uropathy Right hydronephrosis with stent in place Urology consult obtained.  2/24, underwent cystoscopy and right ureteral stent placement.  Per operative report, there is no suspicious lesions, masses or stones.  Hypotension  H/o hypertension Blood pressure running low normal. PTA on metoprolol tartrate 25 mg twice daily.  Minimize the dose to 12.5 mg twice daily. Continue IV hydration.  Acute on chronic anemia Baseline hemoglobin more than 10.  Lately running low.  Noted a drop in hemoglobin to 7.9 on 2/24.  No evidence of active bleeding.  Repeat labs tomorrow.  Transfuse if less than 7. Continue iron supplement,  Continue to  monitor. Recent Labs    06/13/22 1021 07/03/22 1630 07/31/22 1415 08/03/22 1749 08/04/22 0257  HGB 7.4* 10.2* 9.8* 9.2* 7.9*  MCV 101.7* 95 94 96.3 94.4   COPD Past history of smoking COPD.  Continue bronchodilators as needed.   GERD PPI  Hyperlipidemia Lipitor  Restless leg syndrome Continue Requip.      Mobility: PT eval ordered  Goals of care   Code Status: Full Code     DVT prophylaxis:  SCDs Start: 08/03/22 1957   Antimicrobials: IV ampicillin Fluid: Per nephrology.  Currently on NS at 75 mill per hour. Consultants: Nephrology, urology, ID Family Communication: None at bedside  Status is: Inpatient Level of care: Progressive   Dispo: Patient is from: Home              Anticipated d/c is to: Pending clinical course Continue in-hospital care because: AKI, hyperkalemia, bacteremia   Scheduled Meds:  atorvastatin  40 mg Oral Daily   ferrous sulfate  325 mg Oral Q breakfast   fluticasone furoate-vilanterol  1 puff Inhalation Daily   metoprolol tartrate  25 mg Oral BID   pantoprazole  40 mg Oral Daily   rOPINIRole  2 mg Oral BID   sodium bicarbonate  650 mg Oral TID   umeclidinium bromide  1 puff Inhalation Daily    PRN meds: acetaminophen **OR** acetaminophen, levalbuterol, melatonin, ondansetron (ZOFRAN) IV   Infusions:   sodium chloride 75 mL/hr at 08/05/22 0700   ampicillin (OMNIPEN) IV Stopped (08/05/22 0549)    Diet:  Diet Order             Diet regular Room service appropriate? Yes; Fluid consistency: Thin  Diet effective now                   Antimicrobials: Anti-infectives (From admission, onward)    Start     Dose/Rate Route Frequency Ordered Stop   08/05/22 0600  ampicillin (OMNIPEN) 2 g in sodium chloride 0.9 % 100 mL IVPB        2 g 300 mL/hr over 20 Minutes Intravenous 2 times daily 08/05/22 0413     08/04/22 0915  cefTRIAXone (ROCEPHIN) 1 g in sodium chloride 0.9 % 100 mL IVPB  Status:  Discontinued        1 g 200  mL/hr over 30 Minutes Intravenous Every 24 hours 08/04/22 0904 08/05/22 0413       Skin assessment:       Nutritional status:  Body mass index is 27.01 kg/m.          Objective: Vitals:   08/05/22 0900 08/05/22 1020  BP: 105/86 (!) 108/54  Pulse: 91 89  Resp: 18 19  Temp:    SpO2: 96% 98%    Intake/Output Summary (Last 24 hours) at 08/05/2022 1105 Last data filed at 08/05/2022 0700 Gross per 24 hour  Intake 450.4 ml  Output 850 ml  Net -399.6 ml   Filed Weights   08/04/22 1321 08/04/22 1322 08/05/22 0406  Weight: 67 kg 67 kg 67 kg   Weight change: 0.4 kg Body mass index is 27.01 kg/m.   Physical Exam: General exam: Pleasant, elderly Caucasian female.  Not in pain Skin: No rashes,  lesions or ulcers. HEENT: Atraumatic, normocephalic, no obvious bleeding Lungs: Clear to auscultation bilaterally CVS: Regular rate and rhythm, no murmur GI/Abd soft nontender, nondistended, bowel sound present CNS: More alert today.  Oriented x 3.   Psychiatry: Mood appropriate Extremities: No pedal edema, no calf tenderness  Data Review: I have personally reviewed the laboratory data and studies available.  F/u labs ordered Unresulted Labs (From admission, onward)     Start     Ordered   08/06/22 [id number]  Basic metabolic panel  Daily,   R     Question:  Specimen collection method  Answer:  Lab=Lab collect   08/05/22 0803   08/06/22 0500  CBC with Differential/Platelet  Daily,   R     Question:  Specimen collection method  Answer:  Lab=Lab collect   08/05/22 0803   08/05/22 0625  Iron and TIBC  Add-on,   AD       Question:  Specimen collection method  Answer:  Lab=Lab collect   08/05/22 0624   08/05/22 0625  Ferritin  Add-on,   AD       Question:  Specimen collection method  Answer:  Lab=Lab collect   08/05/22 [id number]   08/04/22 1512  Urine Culture  RELEASE UPON ORDERING,   TIMED       Comments: Specimen A: Pre-op diagnosis: Kidney stone    08/04/22 1512   08/04/22 0916   Urine Culture (for pregnant, neutropenic or urologic patients or patients with an indwelling urinary catheter)  (Urine Labs)  Add-on,   AD       Question:  Indication  Answer:  Urgency/frequency   08/04/22 0915            Total time spent in review of labs and imaging, patient evaluation, formulation of plan, documentation and communication with family: 77 minutes  Signed, Terrilee Croak, MD [facility] 08/05/2022

## 2022-08-05 NOTE — Assessment & Plan Note (Signed)
Follows with urology.

## 2022-08-05 NOTE — Progress Notes (Signed)
1 Day Post-Op Subjective: No acute events overnight. UOP increasing after stent exchange. Feels better this AM  Multiple blood cultures positive for gram + cocci  Objective: Vital signs in last 24 hours: Temp:  [96.9 F (36.1 C)-98.9 F (37.2 C)] 96.9 F (36.1 C) (02/25 0725) Pulse Rate:  [83-116] 92 (02/25 0725) Resp:  [15-30] 20 (02/25 0725) BP: (81-122)/(49-71) 104/60 (02/25 0700) SpO2:  [91 %-99 %] 99 % (02/25 0725) Weight:  [67 kg] 67 kg (02/25 0406)  Intake/Output from previous day: 02/24 0701 - 02/25 0700 In: 450.4 [I.V.:350; IV Piggyback:100.4] Out: 850 [Urine:850] Intake/Output this shift: No intake/output data recorded.  Physical Exam:  General: Alert and oriented CV: RRR Lungs: Clear Abdomen: Soft, ND, ATTP Ext: NT, No erythema  Lab Results: Recent Labs    08/03/22 1749 08/04/22 0257  HGB 9.2* 7.9*  HCT 29.0* 23.7*   BMET Recent Labs    08/04/22 1810 08/05/22 0018  NA 134* 135  K 5.7* 5.6*  CL 101 101  CO2 20* 20*  GLUCOSE 104* 202*  BUN 79* 81*  CREATININE 6.61* 6.57*  CALCIUM 7.6* 7.6*     Studies/Results: DG Retrograde Pyelogram  Result Date: 08/04/2022 CLINICAL DATA:  82 year old female with history of right double-J nephroureteral stent. EXAM: RETROGRADE PYELOGRAM COMPARISON:  CT abdomen pelvis from 08/03/2022 FINDINGS: Single fluoroscopic image demonstrates superior aspect of the indwelling right double-J nephroureteral stent which appears well positioned. IMPRESSION: Single fluoroscopic image demonstrates superior aspect of the indwelling right double-J nephroureteral stent which appears well positioned. Ruthann Cancer, MD [facility] Electronically Signed   By: Ruthann Cancer M.D.   On: 08/04/2022 16:47   CT Renal Stone Study  Result Date: 08/03/2022 CLINICAL DATA:  Rule out obstructive uropathy. Routine blood work demonstrated high potassium levels. EXAM: CT ABDOMEN AND PELVIS  WITHOUT CONTRAST TECHNIQUE: Multidetector CT imaging of the abdomen and pelvis was performed following the standard protocol without IV contrast. RADIATION DOSE REDUCTION: This exam was performed according to the departmental dose-optimization program which includes automated exposure control, adjustment of the mA and/or kV according to patient size and/or use of iterative reconstruction technique. COMPARISON:  07/20/2022 FINDINGS: Lower chest: Trace bilateral pleural effusions with atelectasis in the lung bases. Emphysematous changes in the lungs. Hepatobiliary: Surgical absence of the gallbladder. No focal liver lesions. Scattered calcification in the left lobe of the liver, likely granulomas. Pancreas: Unremarkable. No pancreatic ductal dilatation or surrounding inflammatory changes. Spleen: Normal in size without focal abnormality. Adrenals/Urinary Tract: No adrenal gland nodules. Surgical absence of the left kidney. Right kidney is rotated. There is a right ureteral stent with proximal pigtail in the renal pelvis and distal pigtail in the bladder. Moderate right renal hydronephrosis, progressing since prior study. This may indicate stent malfunction. Several cysts in the right kidney. Largest is hyperdense, measuring 3.6 cm diameter. This is unchanged since the prior study and likely represents a hemorrhagic cyst. Stranding around the right ureter may indicate changes due to pyelonephritis or other inflammatory process. Bladder is decompressed, limiting evaluation but there is asymmetric wall thickening of the right bladder wall, unchanged since prior study. Stomach/Bowel: Stomach, small bowel, and colon are not abnormally distended. Residual densities scattered throughout the small bowel and colon are new since prior study, likely representing ingested contrast material or other radiodense material. No evidence of bowel wall thickening or inflammatory stranding. Appendix is not identified. Colonic diverticula  without evidence of acute diverticulitis. Vascular/Lymphatic: Calcification of the aorta. No aneurysm.  No significant lymphadenopathy. Reproductive: Status post hysterectomy. No adnexal masses. Other: No free air or free fluid in the abdomen. Presacral soft tissue stranding is similar to prior study and may be inflammatory. Mild edema in the subcutaneous fat. Musculoskeletal: Degenerative changes in the spine. Postoperative fixation of the lower spine. IMPRESSION: 1. Interval development of right hydronephrosis despite presence of a ureteral stent. This may represent stent malfunction. Stranding along the right ureter may also indicate infection such as pyelonephritis. 2. Hyperdense right renal cyst is unchanged since prior study, likely hemorrhagic cyst. No imaging follow-up is indicated. 3. Asymmetric wall thickening of the right side of the bladder is unchanged since prior study. Etiology is indeterminate. 4. Aortic atherosclerosis. Electronically Signed   By: Lucienne Capers M.D.   On: 08/03/2022 21:49   US Renal  Result Date: 08/03/2022 CLINICAL DATA:  History of prior left nephrectomy, presenting with acute kidney injury. EXAM: RENAL / URINARY TRACT ULTRASOUND COMPLETE COMPARISON:  July 23, 2022 FINDINGS: Right Kidney: Renal measurements: 11.2 cm x 6.6 cm x 6.4 cm = volume: 243.2 mL. Diffusely increased echogenicity of the renal parenchyma is noted. A 3.2 cm x 3.5 cm x 2.9 cm complex renal cyst is seen within the upper pole of the right kidney. An adjacent 1.6 cm x 1.5 cm x 1.6 cm simple right renal cyst is noted. These areas are seen on the prior study. There is moderate severity right-sided hydronephrosis which represents a new finding. The proximal portion of a right-sided endo ureteral stent is seen within the proximal right ureter. Left Kidney: The left kidney is surgically absent. Bladder: Poorly distended and subsequently limited in evaluation. The distal portion of a right-sided endo ureteral  stent is visualized. Other: None. IMPRESSION: 1. Right-sided endo ureteral stent, as described above, with moderate severity right-sided hydronephrosis and proximal hydroureter. 2. Diffusely increased renal echogenicity which likely represents sequelae associated with acute kidney injury. 3. Complex and simple right renal cysts, as described above. 4. Findings consistent with prior left nephrectomy. Electronically Signed   By: Virgina Norfolk M.D.   On: 08/03/2022 21:34   DG Chest Port 1 View  Result Date: 08/03/2022 CLINICAL DATA:  Leukocytosis EXAM: PORTABLE CHEST 1 VIEW COMPARISON:  07/22/2022 FINDINGS: Stable cardiomediastinal silhouette. Aortic atherosclerotic calcification. Right chest wall Port-A-Cath tip in the low SVC. Bibasilar atelectasis/consolidation. Question small bilateral pleural effusions. No pneumothorax. IMPRESSION: Bibasilar atelectasis/consolidation. Pneumonia is difficult to exclude. Question small bilateral pleural effusions. Electronically Signed   By: Placido Sou M.D.   On: 08/03/2022 21:16    Assessment/Plan: Alexandra [name] is POD 1 s/p right ureteral stent exchange; UOP increased since stent placement, Cr approximately stable --nephrology following --if creatinine trends better, patient can f/u with [place] or [place] urologist; likely would need ~q3 month stent exchanges --conversely, if stent exchange does not improve renal function, could consider re-imaging Monday or Tuesday. If persistent hydro patient may need nephrostomy tube.   LOS: 2 days   Donald Pore MD 08/05/2022, 7:51 AM [facility]  Pager: [phone]

## 2022-08-05 NOTE — Assessment & Plan Note (Signed)
The current medical regimen is effective;  continue present plan and medications. Continue breztri 2 puffs twice daily.

## 2022-08-05 NOTE — Assessment & Plan Note (Signed)
Follow with urology.

## 2022-08-05 NOTE — Assessment & Plan Note (Signed)
>>  ASSESSMENT AND PLAN FOR HYPERTENSIVE KIDNEY DISEASE WITH STAGE 4 CHRONIC KIDNEY DISEASE (HCC) WRITTEN ON 08/05/2022  4:03 PM BY COX, KIRSTEN, MD  Management per specialist.  Check cmp.

## 2022-08-05 NOTE — Progress Notes (Signed)
[facility] Progress Note  Name: Alexandra CAPELLO MRN: [id number] DOB: [birth year]  Chief Complaint:  Decreased urine output and abnormal labs  Subjective:  She had a cystoscopy and right ureteral stent placement with urology on 2/24.  She had 700 mL uop over 2/24 documented - she states this has really picked up.  States that she had not been making much urine at home.  Note that she was found to have gram positive cocci bacteremia in the interim since last exam.    Review of systems:  She denies any current nausea - had a little yesterday after coughing  Denies shortness of breath or chest pain  ------------- Background on consult: Alexandra [name] is a 82 y.o. female with a history of CKD stage IV, COPD, hypertension, osteoporosis, renal cell carcinoma status post left total nephrectomy in 1999 who presented to the hospital with decreased urine output and abnormal outpatient labs obtained at [facility]  She was found to have acute kidney injury on blood work with a clinic appointment.  She was also found to have a potassium of 6.7 so was directed to the ER.  Nephrology was consulted.  She received 2 amps bicarbonate, albuterol, insulin/glucose, and lokelma.  Her CT a/p with contrast demonstrated right hydronephrosis despite her stent.  Nephrology requested urology consult.  She follows with Dr. Hollie Salk at [facility].  Creatinine trends are listed below; baseline had been around 3.  She normally lives at home and is ambulatory with her walker.  She is on 2 L of oxygen at baseline and this is her current requirement as well.  She has torsemide PRN for swelling and had not needed it recently.  She denies any recent nausea vomiting or diarrhea outpatient - states that she had a little nausea a couple of hours ago after a medication.       Intake/Output Summary (Last 24 hours) at 08/05/2022 0614 Last data filed at 08/05/2022 0446 Gross per 24 hour  Intake 350.4 ml   Output 700 ml  Net -349.6 ml    Vitals:  Vitals:   08/04/22 2116 08/04/22 2303 08/05/22 0000 08/05/22 0406  BP: (!) 105/55 109/71 (!) 109/56 (!) 106/54  Pulse: 90 89 87 90  Resp:  '20 17 20  '$ Temp:  97.6 F (36.4 C)  97.6 F (36.4 C)  TempSrc:  Oral  Oral  SpO2:  94% 97% 95%  Weight:    67 kg  Height:         Physical Exam:  General elderly female in bed in no acute distress HEENT normocephalic atraumatic extraocular movements intact sclera anicteric Neck supple trachea midline Lungs rare exp wheeze; normal work of breathing at rest; on 3 liters oxygen Heart S1S2 no rub Abdomen soft nontender non-distended; obese habitus  Extremities no edema appreciated  Psych normal mood and affect Neuro alert and oriented x 3 provides hx and follows commands GU no foley    Medications reviewed   Labs:     Latest Ref Rng & Units 08/05/2022   12:18 AM 08/04/2022    6:10 PM 08/04/2022    8:37 AM  BMP  Glucose 70 - 99 mg/dL 202  104  104   BUN 8 - 23 mg/dL 81  79  81   Creatinine 0.44 - 1.00 mg/dL 6.57  6.61  6.87   Sodium 135 - 145 mmol/L 135  134  132   Potassium 3.5 - 5.1 mmol/L 5.6  5.7  5.2   Chloride 98 - 111 mmol/L 101  101  98   CO2 22 - 32 mmol/L '20  20  19   '$ Calcium 8.9 - 10.3 mg/dL 7.6  7.6  7.7      Assessment/Plan:   # Acute kidney injury secondary to obstructive uropathy - Obstructive uropathy in setting of solitary kidney as below; note also ischemic and pre-renal insults with marginal blood pressure and bacteremia - s/p stent exchange with urology  - Continue supportive care - NS at 75 ml/hr x 24 hours; note she reports hx heart failure    - check post-void residual bladder scan and in/out cath if over 300 mL retained   # Obstructive uropathy - s/p stent exchange with urology  # Gram positive bacteremia  - Antibiotics per primary team - she is on ampicillin    # Pyelonephritis  - Abx per primary team.  Note was on rocephin and was transitioned to  ampicillin given new bacteremia per pharmacy charting  # Hyperkalemia - lokelma once - 1 amp bicarbonate    # Metabolic acidosis - secondary to AKI  - continue sodium bicarbonate    # Solitary kidney  - note hx of remote left total nephrectomy for renal cell   # CKD stage IV - Baseline Cr near 3 and follows with [facility] - Dr. Hollie Salk   # Hypertension - Hx of such; pressures marginal now    # COPD - note home oxygen requirement of 2 liters   # Normocytic anemia  - Check iron stores - No acute indication for PRBC's; transfuse per primary team     Disposition - continue inpatient monitoring    Claudia Desanctis, MD 08/05/2022 6:38 AM

## 2022-08-05 NOTE — Assessment & Plan Note (Signed)
Well controlled.  No changes to medicines. Continue atorvastatin 40 mg before bed.  Continue to work on eating a healthy diet and exercise.  Labs drawn today.

## 2022-08-05 NOTE — Assessment & Plan Note (Signed)
Check cbc 

## 2022-08-05 NOTE — Consult Note (Signed)
[facility]    Date of Admission:  08/03/2022   Total days of inpatient antibiotics 1        Reason for Consult: E faecalis bacteremia    Principal Problem:   Acute renal failure superimposed on stage 4 chronic kidney disease ([place]) Active Problems:   Essential hypertension   Hyperlipidemia   History of anemia due to chronic kidney disease   Hyperkalemia   GERD (gastroesophageal reflux disease)   Restless leg syndrome   High anion gap metabolic acidosis   SIRS (systemic inflammatory response syndrome) (HCC)   Leukocytosis   History of COPD   Assessment: 82 YF with CKD stage IV, renal cell carcinoma status post total left nephrectomy 1999 presented to ED due to elevated creatinine at 6.8K.  Baseline creatinine is 2.6-3.6.  #E faecalis bacteremia likely secondary to pyelonephritis #Pyelonephritis #AKI on CKD - CT renal study showed right hydronephrosis despite ureteral stent, may represent stent malfunction.  Stranding along the right ureter may indicate pyonephritis. - Blood culture from 2/24 + E faecalis, patient on ampicillin - Pt states she has been on abx till Friday for an " infection", does not know the name.  #History of pericarditis 06/20/22 TTE showed pericardial effusion.   Recommendations:  -Repeat blood Cx to ensure clearance -TTE ordered -Continue Ampicillin  I have personally spent 80 minutes involved in face-to-face and non-face-to-face activities for this patient on the day of the visit. Professional time spent includes the following activities: Preparing to see the patient (review of tests), Obtaining and/or reviewing separately obtained history (admission/discharge record), Performing a medically appropriate examination and/or evaluation , Ordering medications/tests/procedures, referring and communicating with other health care professionals, Documenting clinical information in the EMR, Independently interpreting results  (not separately reported), Communicating results to the patient/family/caregiver, Counseling and educating the patient/family/caregiver and Care coordination (not separately reported).   Microbiology:   Antibiotics: CTX, cefazolin, amp on 2/24  Cultures: Blood 2/24 Efaecalis Urine 2/24 x2    HPI: Alexandra [name] is a 82 y.o. female past medical history of stage IV CKD,, COPD, essential hypertension hyperlipidemia, restless leg syndrome, renal cell carcinoma status post left total nephrectomy 1999, anemia of CKD admitted to [facility] with AKI on CKD.  Sent to ED as outpatient labs showed elevated serum creatinine her baseline is 2.6-3.6.  On arrival to the ED creatinine 6.84.  WBC 23.9 K.  CT renal showed findings of stent malfunction, right pyelonephritis.  Urology engaged, patient underwent cystoscopy with debridement with ureteral stent placement and noted increased UOP after stent placement.  ID was also consulted given E faecalis bacteremia.  Review of Systems: Review of Systems  All other systems reviewed and are negative.   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   Hypertension    Osteoporosis    Restless legs syndrome    RLS (restless legs syndrome)     Social History   Tobacco Use   Smoking status: Former    Packs/day: 1.00    Years: 50.00    Total pack years: 50.00    Types: Cigarettes    Quit date: 06/11/2005    Years since quitting: 17.1   Smokeless tobacco: Never  Vaping Use   Vaping Use: Never used  Substance Use Topics   Alcohol use: No   Drug use: No    Family History  Problem Relation Age of Onset   Emphysema Mother    Hyperlipidemia Sister    Hypertension Sister    COPD Sister    Hyperlipidemia Sister    Hypertension Sister    Lymphoma  Sister    Obesity Brother    Hyperlipidemia Brother    Hypertension Brother    Breast cancer Neg Hx    Scheduled Meds:  atorvastatin  40 mg Oral Daily   ferrous sulfate  325 mg Oral Q breakfast   fluticasone furoate-vilanterol  1 puff Inhalation Daily   metoprolol tartrate  25 mg Oral BID   pantoprazole  40 mg Oral Daily   rOPINIRole  2 mg Oral BID   sodium bicarbonate  650 mg Oral TID   umeclidinium bromide  1 puff Inhalation Daily   Continuous Infusions:  sodium chloride 75 mL/hr at 08/05/22 0700   ampicillin (OMNIPEN) IV Stopped (08/05/22 0549)   PRN Meds:.acetaminophen **OR** acetaminophen, levalbuterol, melatonin, ondansetron (ZOFRAN) IV Allergies  Allergen Reactions   Contrast Media [Iodinated Contrast Media] Hives, Itching and Rash   Omeprazole Itching   Shellfish Allergy Nausea And Vomiting   Shellfish-Derived Products Nausea And Vomiting    OBJECTIVE: Blood pressure (!) 108/54, pulse 89, temperature (!) 96.9 F (36.1 C), temperature source Axillary, resp. rate 19, height 5' 2.01" (1.575 m), weight 67 kg, SpO2 98 %.  Physical Exam Constitutional:      Appearance: Normal appearance.  HENT:     Head: Normocephalic and atraumatic.     Right Ear: Tympanic membrane normal.     Left Ear: Tympanic membrane normal.     Nose: Nose normal.     Mouth/Throat:     Mouth: Mucous membranes are moist.  Eyes:     Extraocular Movements: Extraocular movements intact.     Conjunctiva/sclera: Conjunctivae normal.     Pupils: Pupils are equal, round, and reactive to light.  Cardiovascular:     Rate and Rhythm: Normal rate and regular rhythm.     Heart sounds: No murmur heard.    No friction rub. No gallop.  Pulmonary:     Effort: Pulmonary effort is normal.     Breath sounds: Normal breath sounds.  Abdominal:     General: Abdomen is flat.     Palpations: Abdomen is soft.  Musculoskeletal:        General: Normal range of motion.  Skin:    General: Skin is warm and dry.   Neurological:     General: No focal deficit present.     Mental Status: She is alert and oriented to person, place, and time.  Psychiatric:        Mood and Affect: Mood normal.     Lab Results Lab Results  Component Value Date   WBC 18.6 (H) 08/04/2022   HGB 7.9 (L) 08/04/2022   HCT 23.7 (L) 08/04/2022   MCV 94.4 08/04/2022   PLT 235 08/04/2022    Lab Results  Component Value Date   CREATININE 6.57 (H) 08/05/2022   BUN 81 (H) 08/05/2022   NA 135 08/05/2022   K 5.6 (H) 08/05/2022   CL 101 08/05/2022   CO2 20 (L) 08/05/2022    Lab Results  Component Value Date   ALT 16 08/04/2022   AST 18 08/04/2022   ALKPHOS 65 08/04/2022   BILITOT 0.6 08/04/2022       [place] Delauder Candiss Norse,  [facility] 08/05/2022, 11:01 AM

## 2022-08-06 DIAGNOSIS — N179 Acute kidney failure, unspecified: Secondary | ICD-10-CM | POA: Diagnosis not present

## 2022-08-06 DIAGNOSIS — N184 Chronic kidney disease, stage 4 (severe): Secondary | ICD-10-CM | POA: Diagnosis not present

## 2022-08-06 LAB — CBC WITH DIFFERENTIAL/PLATELET
Abs Immature Granulocytes: 0.36 10*3/uL — ABNORMAL HIGH (ref 0.00–0.07)
Basophils Absolute: 0 10*3/uL (ref 0.0–0.1)
Basophils Relative: 0 %
Eosinophils Absolute: 0 10*3/uL (ref 0.0–0.5)
Eosinophils Relative: 0 %
HCT: 22.2 % — ABNORMAL LOW (ref 36.0–46.0)
Hemoglobin: 7.3 g/dL — ABNORMAL LOW (ref 12.0–15.0)
Immature Granulocytes: 3 %
Lymphocytes Relative: 8 %
Lymphs Abs: 1.1 10*3/uL (ref 0.7–4.0)
MCH: 30.8 pg (ref 26.0–34.0)
MCHC: 32.9 g/dL (ref 30.0–36.0)
MCV: 93.7 fL (ref 80.0–100.0)
Monocytes Absolute: 0.5 10*3/uL (ref 0.1–1.0)
Monocytes Relative: 4 %
Neutro Abs: 12.5 10*3/uL — ABNORMAL HIGH (ref 1.7–7.7)
Neutrophils Relative %: 85 %
Platelets: 241 10*3/uL (ref 150–400)
RBC: 2.37 MIL/uL — ABNORMAL LOW (ref 3.87–5.11)
RDW: 18.3 % — ABNORMAL HIGH (ref 11.5–15.5)
WBC: 14.5 10*3/uL — ABNORMAL HIGH (ref 4.0–10.5)
nRBC: 0 % (ref 0.0–0.2)

## 2022-08-06 LAB — URINE CULTURE
Culture: 80000 — AB
Culture: >=100000 — AB

## 2022-08-06 LAB — BASIC METABOLIC PANEL
Anion gap: 13 (ref 5–15)
BUN: 79 mg/dL — ABNORMAL HIGH (ref 8–23)
CO2: 19 mmol/L — ABNORMAL LOW (ref 22–32)
Calcium: 7.2 mg/dL — ABNORMAL LOW (ref 8.9–10.3)
Chloride: 101 mmol/L (ref 98–111)
Creatinine, Ser: 6.08 mg/dL — ABNORMAL HIGH (ref 0.44–1.00)
GFR, Estimated: 7 mL/min — ABNORMAL LOW (ref >60)
Glucose, Bld: 139 mg/dL — ABNORMAL HIGH (ref 70–99)
Potassium: 5 mmol/L (ref 3.5–5.1)
Sodium: 133 mmol/L — ABNORMAL LOW (ref 135–145)

## 2022-08-06 MED ORDER — DARBEPOETIN ALFA 60 MCG/0.3ML IJ SOSY
60.0000 ug | PREFILLED_SYRINGE | Freq: Once | INTRAMUSCULAR | Status: AC
  Administered 2022-08-06: 60 ug via SUBCUTANEOUS
  Filled 2022-08-06: qty 0.3

## 2022-08-06 NOTE — Care Management Important Message (Signed)
Important Message  Patient Details  Name: Alexandra [name] MRN: [id number] Date of Birth: [birth year]   Medicare Important Message Given:  Yes     Orbie Pyo 08/06/2022, 3:19 PM

## 2022-08-06 NOTE — Progress Notes (Signed)
Patient has E faecalis bacteremia likely secondary to pyelonephritis. Plan for TEE.  TOC following.

## 2022-08-06 NOTE — Plan of Care (Signed)
  Problem: Education: Goal: Knowledge of General Education information will improve Description: Including pain rating scale, medication(s)/side effects and non-pharmacologic comfort measures Outcome: Progressing   Problem: Health Behavior/Discharge Planning: Goal: Ability to manage health-related needs will improve Outcome: Progressing   Problem: Clinical Measurements: Goal: Will remain free from infection Outcome: Progressing   Problem: Activity: Goal: Risk for activity intolerance will decrease Outcome: Progressing   Problem: Nutrition: Goal: Adequate nutrition will be maintained Outcome: Progressing   Problem: Coping: Goal: Level of anxiety will decrease Outcome: Progressing   Problem: Safety: Goal: Ability to remain free from injury will improve Outcome: Progressing   Problem: Skin Integrity: Goal: Risk for impaired skin integrity will decrease Outcome: Progressing

## 2022-08-06 NOTE — Progress Notes (Signed)
PROGRESS NOTE  Alexandra [name]  DOB: [birth year]  PCP: Rochel Brome, MD [id number]  DOA: 08/03/2022  LOS: 3 days  Hospital Day: 4  Brief narrative: Alexandra [name] is a 82 y.o. female with PMH significant for HTN, HLD, CKD 4, COPD on 2 L oxygen at home., GERD, restless legs, per parathyroidism, RCC s/p left total nephrectomy 1999, anemia chronic disease. 2/23, patient was sent to the ED after noted to have elevated creatinine and potassium level in outpatient lab done on 2/20.  Follows up with nephrologist Dr. Hollie Salk.  No recent symptoms.  In the ED, patient was afebrile, hemodynamically stable with blood pressure in 100s Initial labs with sodium 132, potassium 6.7, BUN/creatinine 80/6.84, WC count elevated to 24,000, hemoglobin low at 9.2 EDP discussed with nephrologist Dr. Royce Macadamia.  Patient received temporizing measures for hyperkalemia with sodium bicarb, insulin, calcium gluconate, Lokelma.  Urinalysis showed hazy yellow urine, moderate hemoglobin, large leukocytes, rare bacteria. Ultrasound renal was obtained which showed a right sided ureteral stent with moderate severity right-sided hydronephrosis and proximal hydroureter. CT renal study confirmed the finding suggesting stent malfunction.  Also suggested possible right pyelonephritis. Urology consultation was obtained.  Admitted to [facility] Blood cultures sent on admission is growing Enterococcus faecalis in both bottles.  Subjective: Patient was seen and examined this morning. Pleasant elderly Caucasian female.  Sitting up in recliner.  Not in distress.  On low-flow oxygen. Feels better than at presentation.  Pending TTE at this time.  Assessment and plan: Sepsis secondary to acute right pyelonephritis POA Enterococcus faecalis bacteremia Urinalysis showed large leukocytes WBC count significantly elevated to 24,000 CT scan suggestive of acute pyonephritis Urine culture grew > 100,000 CFU per mL of Enterococcus faecalis.  Blood  culture Enterococcus faecalis as well.  She was initially started on IV Rocephin, later switched to IV ampicillin.  Pending final susceptibility of blood culture. ID following.  Pending TTE. WBC count improving.  Lactic acid level normal. Clinically improving.  Continue to monitor. Recent Labs  Lab 07/31/22 1415 08/03/22 1749 08/04/22 0257 08/04/22 0940 08/06/22 0020  WBC 11.6* 23.9* 18.6*  --  14.5*  LATICACIDVEN  --   --   --  0.6  --     Acute hyperkalemia AKI on CKD 4 Anion gap metabolic acidosis Sent for significant rise in potassium and creatinine level and outpatient blood work. Labs in the ED found severe hyperkalemia at 6.7 and creatinine elevated to 6.88. Acute renal failure and hyperkalemia likely secondary to obstructive uropathy in the setting of solitary right kidney. Initially given membrane stabilizing agents for hyperkalemia. Lab trend as below.  Creatinine slightly better.  Potassium improved down to 5 today.  Defer to nephrology for hydration and Lokelma. Continue sodium bicarb 650 mg 3 times daily. Recent Labs  Lab 08/03/22 2107 08/04/22 0257 08/04/22 0837 08/04/22 1810 08/05/22 0018 08/06/22 0020  NA 133* 135 132* 134* 135 133*  K 5.0 5.0 5.2* 5.7* 5.6* 5.0  CL 102 101 98 101 101 101  CO2 16* 19* 19* 20* 20* 19*  GLUCOSE 95 105* 104* 104* 202* 139*  BUN 84* 85* 81* 79* 81* 79*  CREATININE 6.74* 6.81* 6.87* 6.61* 6.57* 6.08*  CALCIUM 8.1* 7.9* 7.7* 7.6* 7.6* 7.2*  MG 1.7 1.7  --   --   --   --   PHOS  --  5.5*  --   --  6.3*  --    Obstructive uropathy Right hydronephrosis with stent in place Urology consult obtained.  2/24, underwent cystoscopy and right ureteral stent placement.  Per operative report, there is no suspicious lesions, masses or stones.  Hypotension  H/o hypertension Blood pressure running low normal. PTA on metoprolol tartrate 25 mg twice daily.  Currently continued on low-dose at 12.5 mg twice daily.  Acute on chronic  anemia Baseline hemoglobin more than 10.  Lately running low.  Noted a drop in hemoglobin to 7.9 on 2/24.  No evidence of active bleeding.  Hemoglobin low at 7.3 today.  Transfuse if less than 7. Continue iron supplement,  Continue to monitor. Recent Labs    07/03/22 1630 07/31/22 1415 08/03/22 1749 08/04/22 0257 08/05/22 1201 08/06/22 0020  HGB 10.2* 9.8* 9.2* 7.9*  --  7.3*  MCV 95 94 96.3 94.4  --  93.7  FERRITIN  --   --   --   --  883*  --   TIBC  --   --   --   --  190*  --   IRON  --   --   --   --  77  --    COPD Chronic respiratory failure with hypoxia Past history of smoking COPD on 2 L oxygen at home.  Remains on 2 L.. Continue bronchodilators as needed   GERD PPI  Hyperlipidemia Lipitor  Restless leg syndrome Continue Requip.      Mobility: PT eval ordered  Goals of care   Code Status: Full Code     DVT prophylaxis:  SCDs Start: 08/03/22 1957   Antimicrobials: IV ampicillin Fluid: Per nephrology. Consultants: Nephrology, urology, ID Family Communication: None at bedside  Status is: Inpatient Level of care: Progressive   Dispo: Patient is from: Home              Anticipated d/c is to: Pending clinical course Continue in-hospital care because: AKI, hyperkalemia, bacteremia   Scheduled Meds:  atorvastatin  40 mg Oral Daily   ferrous sulfate  325 mg Oral Q breakfast   fluticasone furoate-vilanterol  1 puff Inhalation Daily   metoprolol tartrate  12.5 mg Oral BID   pantoprazole  40 mg Oral Daily   rOPINIRole  2 mg Oral BID   sodium bicarbonate  650 mg Oral TID   umeclidinium bromide  1 puff Inhalation Daily    PRN meds: acetaminophen **OR** acetaminophen, levalbuterol, melatonin, ondansetron (ZOFRAN) IV   Infusions:   ampicillin (OMNIPEN) IV 2 g (08/06/22 1004)    Diet:  Diet Order             Diet regular Room service appropriate? Yes; Fluid consistency: Thin  Diet effective now                    Antimicrobials: Anti-infectives (From admission, onward)    Start     Dose/Rate Route Frequency Ordered Stop   08/05/22 0600  ampicillin (OMNIPEN) 2 g in sodium chloride 0.9 % 100 mL IVPB        2 g 300 mL/hr over 20 Minutes Intravenous 2 times daily 08/05/22 0413     08/04/22 0915  cefTRIAXone (ROCEPHIN) 1 g in sodium chloride 0.9 % 100 mL IVPB  Status:  Discontinued        1 g 200 mL/hr over 30 Minutes Intravenous Every 24 hours 08/04/22 0904 08/05/22 0413       Skin assessment:       Nutritional status:  Body mass index is 29.31 kg/m.  Nutrition Problem: Increased nutrient needs  Objective: Vitals:   08/06/22 0951 08/06/22 1028  BP: (!) 111/51 (!) 109/51  Pulse: 80 82  Resp:  19  Temp:  (!) 97.3 F (36.3 C)  SpO2:  100%    Intake/Output Summary (Last 24 hours) at 08/06/2022 1058 Last data filed at 08/06/2022 0806 Gross per 24 hour  Intake 1279.67 ml  Output 400 ml  Net 879.67 ml   Filed Weights   08/04/22 1322 08/05/22 0406 08/06/22 0454  Weight: 67 kg 67 kg 72.7 kg   Weight change: 5.7 kg Body mass index is 29.31 kg/m.   Physical Exam: General exam: Pleasant, elderly Caucasian female.  Not in pain Skin: No rashes, lesions or ulcers. HEENT: Atraumatic, normocephalic, no obvious bleeding Lungs: Clear to auscultation bilaterally CVS: Regular rate and rhythm, no murmur GI/Abd soft nontender, nondistended, bowel sound present CNS: More alert today.  Oriented x 3.   Psychiatry: Mood appropriate Extremities: No pedal edema, no calf tenderness  Data Review: I have personally reviewed the laboratory data and studies available.  F/u labs ordered Unresulted Labs (From admission, onward)     Start     Ordered   08/06/22 [id number]  Basic metabolic panel  Daily,   R     Question:  Specimen collection method  Answer:  Lab=Lab collect   08/05/22 0803   08/06/22 0500  CBC with Differential/Platelet  Daily,   R     Question:  Specimen collection method   Answer:  Lab=Lab collect   08/05/22 0803            Total time spent in review of labs and imaging, patient evaluation, formulation of plan, documentation and communication with family: 75 minutes  Signed, Terrilee Croak, MD [facility] 08/06/2022

## 2022-08-06 NOTE — Plan of Care (Signed)

## 2022-08-06 NOTE — Progress Notes (Signed)
Initial Nutrition Assessment  DOCUMENTATION CODES:   Not applicable  INTERVENTION:   - Ensure Enlive po BID, each supplement provides 350 kcal and 20 grams of protein  - Renal MVI daily  NUTRITION DIAGNOSIS:   Increased nutrient needs related to acute illness (pyelonephritis, bacteremia) as evidenced by estimated needs.  GOAL:   Patient will meet greater than or equal to 90% of their needs  MONITOR:   PO intake, Supplement acceptance, Labs, Weight trends, I & O's  REASON FOR ASSESSMENT:   Malnutrition Screening Tool    ASSESSMENT:   82 year old female who presented to the ED on 2/23 with hyperkalemia. PMH of CKD stage IV, COPD, HLD, HTN, renal cell carcinoma s/p L total nephrectomy in 1999, anemia of chronic kidney disease, GERD, bladder cancer, osteoporosis. Pt admitted with AKI on CKD, sepsis secondary to acute R pyelonephritis and Enterococcus faecalis bacteremia.  02/24 - s/p cystoscopy, R ureteral stent placement  RD working remotely. Noted plan for TTE.  Pt currently on a Regular diet with 100% meal completions documented for last 2 meals.  Reviewed weight history in chart. Pt's weight has fluctuated significantly over the last 10 months between 66-78 kg. Weight on admission documented as 66.6 kg but weight this AM documented as 72.7 kg. Question accuracy of today's weight. If current weight is accurate, pt has experienced a 5.7 kg weight loss since 09/21/21. This is a 7.3% weight loss in 10 months which is not clinically significant for timeframe. However, true dry weight is likely lower than current weight given pt with non-pitting edema to BUE and mild pitting edema to BLE per nursing documentation. Pt is at risk for malnutrition.  Pt seen previously by [place] on 05/29/22 and was drinking Ensure supplements at that time. RD to order oral nutrition supplements during admission. Will also order renal MVI daily given CKD IV.  Admit weight: 66.6 kg Current  weight: 72.7 kg  Meal Completion: 100% x 2 meals  Medications reviewed and include: ferrous sulfate, protonix, sodium bicarb 650 mg TID, IV abx  Labs reviewed: sodium 133, BUN 79, creatinine 6.08, phosphorus 6.3 on 2/25, WBC 14.5, hemoglobin 7.3  UOP: 400 ml + 1 unmeasured occurrence x 24 hours I/O's: +614 ml x 24 hours  NUTRITION - FOCUSED PHYSICAL EXAM:  Unable to complete at this time. RD working remotely.  Diet Order:   Diet Order             Diet regular Room service appropriate? Yes; Fluid consistency: Thin  Diet effective now                   EDUCATION NEEDS:   No education needs have been identified at this time  Skin:  Skin Assessment: Reviewed RN Assessment  Last BM:  08/05/22 small type 5  Height:   Ht Readings from Last 1 Encounters:  08/04/22 5' 2.01" (1.575 m)    Weight:   Wt Readings from Last 1 Encounters:  08/06/22 72.7 kg    BMI:  Body mass index is 29.31 kg/m.  Estimated Nutritional Needs:   Kcal:  2000-2200  Protein:  90-105 grams  Fluid:  >2.0 L    Gustavus Bryant, MS, RD, LDN Inpatient Clinical Dietitian Please see [facility] for contact information.

## 2022-08-06 NOTE — Progress Notes (Addendum)
[facility] NEPHROLOGY PROGRESS NOTE  Assessment/ Plan: Pt is a 82 y.o. yo female with hypertension, renal cell carcinoma status post left total nephrectomy in 1999, COPD, CKD stage IV admitted with decreased urine output and abnormal lab results including hyperkalemia and AKI.  # Acute kidney injury secondary to obstructive uropathy in the solitary kidney, also with ischemic and prerenal insults with marginal blood pressure and bacteremia.  Status post ureteral stent exchange by urology. She has marginal urine output however both BUN and creatinine level are slowly trending down with IV fluid.  On antibiotics for possible pyelonephritis.  I will continue to watch for renal recovery, daily lab, strict ins and out.   # Obstructive uropathy status post right ureteral stent exchange by urologist on 2/24.  Management per urology.   # E faecalis bacteremia likely due to pyelonephritis: On ampicillin, TTE and ID recommendation.      # Hyperkalemia: Improved with Lokelma, bicarbonate.  Monitor lab.   # Metabolic acidosis due to AKI, continue oral sodium bicarbonate.   # Solitary kidney  - note hx of remote left total nephrectomy for renal cell   # CKD stage IV - Baseline Cr near 3 and follows with [facility] - Dr. Hollie Salk   # Hypertension: Blood pressure variable with some episode of hypotension.  Currently only on low-dose metoprolol.  # Anemia: Drop in hemoglobin noted, no bleeding.  Iron stores acceptable.  I will order a dose of Aranesp.   Subjective: [name].  Urine output is recorded only 400 cc.  Denies nausea, vomiting, chest pain, shortness of breath.  Creatinine level trending down to 6.08 from 6.5.  No other new event. Objective Vital signs in last 24 hours: Vitals:   08/05/22 1900 08/05/22 2300 08/06/22 0319 08/06/22 0454  BP: 106/63 (!) 100/55 (!) 112/52   Pulse: 95 80 88 78  Resp: '16 19 19 20  '$ Temp: 97.6 F (36.4 C) 97.8 F (36.6 C)  97.6 F (36.4 C)   TempSrc: Oral Oral Oral   SpO2: 98% 96% 96% 95%  Weight:    72.7 kg  Height:       Weight change: 5.7 kg  Intake/Output Summary (Last 24 hours) at 08/06/2022 0751 Last data filed at 08/05/2022 2323 Gross per 24 hour  Intake 1039.67 ml  Output 400 ml  Net 639.67 ml       Labs: RENAL PANEL Recent Labs    05/21/22 1041 05/23/22 0919 05/30/22 0825 06/06/22 0917 06/13/22 1021 07/03/22 1630 07/31/22 1415 08/03/22 1030 08/03/22 1749 08/03/22 2107 08/04/22 0257 08/04/22 0837 08/04/22 1810 08/05/22 0018 08/06/22 0020  NA 140 138 136 139 132* 146* 136 132* 130* 133* 135 132* 134* 135 133*  K 6.0* 5.2* 5.4* 4.9 5.1 5.6* 6.3* 6.7* 6.4* 5.0 5.0 5.2* 5.7* 5.6* 5.0  CL 109 107 109 112* '107 104 99 100 99 102 101 98 101 101 '$ 101  CO2 24 22 20* 19* 18* 20 18* 17* 15* 16* 19* 19* 20* 20* 19*  GLUCOSE 113* 107* 95 104* 108* 115* 100* 96 101* 95 105* 104* 104* 202* 139*  BUN 42* 51* 57* 55* 70* 110* 56* 84* 86* 84* 85* 81* 79* 81* 79*  CREATININE 2.62* 2.69* 2.58* 2.97* 3.05* 3.58* 3.68* 6.84* 6.88* 6.74* 6.81* 6.87* 6.61* 6.57* 6.08*  CALCIUM 9.2 9.0 8.6* 8.3* 8.2* 8.9 8.8 8.1* 7.9* 8.1* 7.9* 7.7* 7.6* 7.6* 7.2*  MG  --   --   --   --   --   --   --   --   --  1.7 1.7  --   --   --   --   PHOS  --   --   --   --   --   --   --   --   --   --  5.5*  --   --  6.3*  --   ALBUMIN 3.2* 2.9* 3.2* 3.0* 3.1* 3.5* 3.5* 3.3*  --   --  2.1*  --   --  2.0*  --      Liver Function Tests: Recent Labs  Lab 07/31/22 1415 08/03/22 1030 08/04/22 0257 08/05/22 0018  AST '18 18 18  '$ --   ALT '19 15 16  '$ --   ALKPHOS 99 95 65  --   BILITOT 0.3 0.4 0.6  --   PROT 6.3 5.4* 5.2*  --   ALBUMIN 3.5* 3.3* 2.1* 2.0*   No results for input(s): "LIPASE", "AMYLASE" in the last 168 hours. No results for input(s): "AMMONIA" in the last 168 hours. CBC: Recent Labs    07/03/22 1630 07/31/22 1415 08/03/22 1749 08/04/22 0257 08/05/22 1201 08/06/22 0020  HGB 10.2* 9.8* 9.2* 7.9*  --   7.3*  MCV 95 94 96.3 94.4  --  93.7  FERRITIN  --   --   --   --  883*  --   TIBC  --   --   --   --  190*  --   IRON  --   --   --   --  77  --     Cardiac Enzymes: Recent Labs  Lab 08/03/22 2107  CKTOTAL 43   CBG: No results for input(s): "GLUCAP" in the last 168 hours.  Iron Studies:  Recent Labs    08/05/22 1201  IRON 77  TIBC 190*  FERRITIN 883*   Studies/Results: DG Retrograde Pyelogram  Result Date: 08/04/2022 CLINICAL DATA:  82 year old female with history of right double-J nephroureteral stent. EXAM: RETROGRADE PYELOGRAM COMPARISON:  CT abdomen pelvis from 08/03/2022 FINDINGS: Single fluoroscopic image demonstrates superior aspect of the indwelling right double-J nephroureteral stent which appears well positioned. IMPRESSION: Single fluoroscopic image demonstrates superior aspect of the indwelling right double-J nephroureteral stent which appears well positioned. Ruthann Cancer, MD [facility] Radiology Electronically Signed   By: Ruthann Cancer M.D.   On: 08/04/2022 16:47    Medications: Infusions:  ampicillin (OMNIPEN) IV 2 g (08/05/22 2121)    Scheduled Medications:  atorvastatin  40 mg Oral Daily   ferrous sulfate  325 mg Oral Q breakfast   fluticasone furoate-vilanterol  1 puff Inhalation Daily   metoprolol tartrate  12.5 mg Oral BID   pantoprazole  40 mg Oral Daily   rOPINIRole  2 mg Oral BID   sodium bicarbonate  650 mg Oral TID   umeclidinium bromide  1 puff Inhalation Daily    have reviewed scheduled and prn medications.  Physical Exam: General:NAD, comfortable Heart:RRR, s1s2 nl Lungs:clear b/l, no crackle Abdomen:soft, Non-tender, non-distended Extremities:No edema Neurology: Alert, awake and following commands.  Raegan Winders Prasad Danikah Budzik 08/06/2022,7:51 AM  LOS: 3 days

## 2022-08-06 NOTE — Evaluation (Signed)
Physical Therapy Evaluation/ Discharge Patient Details Name: Alexandra [name] MRN: [id number] DOB: 02-Sep-1940 Today's Date: 08/06/2022  History of Present Illness  82 yo admitted 2/23 with decreased urine output, hyperkalemia, AKI on CKD. Pt s/p uretral stent placement 2/24. PMHx: CKD, COPD on 2L at home, HTN, HLD, RLS, renal cell CA s/p Lt nephrectomy 1999  Clinical Impression  Pt pleasant with report she lives at home with spouse and has a PCA a few times a week but is able to generally care for herself with rollator use. Family performs IADLs and transportation. Pt and son report pt at baseline functional status and uses 2L at home. Pt encouraged to continue gait and mobility acutely but no further acute therapy need with pt and family in agreement, will sign off. Pt has all necessary DME for D/C.  Pt on 2L with SPO2 88-94% but difficult to maintain accurate pleth with mobility, HR 125 with gait     Recommendations for follow up therapy are one component of a multi-disciplinary discharge planning process, led by the attending physician.  Recommendations may be updated based on patient status, additional functional criteria and insurance authorization.  Follow Up Recommendations No PT follow up      Assistance Recommended at Discharge PRN  Patient can return home with the following  A little help with bathing/dressing/bathroom;Assistance with cooking/housework;Assist for transportation;Direct supervision/assist for medications management;Direct supervision/assist for financial management    Equipment Recommendations None recommended by PT  Recommendations for Other Services       Functional Status Assessment Patient has not had a recent decline in their functional status     Precautions / Restrictions Precautions Precautions: Fall      Mobility  Bed Mobility               General bed mobility comments: in chair on arrival and end of session    Transfers Overall  transfer level: Needs assistance   Transfers: Sit to/from Stand Sit to Stand: Supervision           General transfer comment: supervision for lines    Ambulation/Gait Ambulation/Gait assistance: Supervision Gait Distance (Feet): 150 Feet Assistive device: Rollator (4 wheels) Gait Pattern/deviations: Step-through pattern, Decreased stride length   Gait velocity interpretation: 1.31 - 2.62 ft/sec, indicative of limited community ambulator   General Gait Details: pt with steady gait with reliance on rollator, assist for lines with pt able to self-regulate distance  Stairs            Wheelchair Mobility    Modified Rankin (Stroke Patients Only)       Balance Overall balance assessment: Mild deficits observed, not formally tested                                           Pertinent Vitals/Pain Pain Assessment Pain Assessment: No/denies pain    Home Living Family/patient expects to be discharged to:: Private residence Living Arrangements: Spouse/significant other Available Help at Discharge: Family Type of Home: House Home Access: Ramped entrance       Home Layout: One level Home Equipment: Rollator (4 wheels);BSC/3in1;Shower seat;Wheelchair - manual Additional Comments: PCA 30 min 2 days a week    Prior Function Prior Level of Function : Needs assist             Mobility Comments: walks with rollator no falls ADLs Comments: family do the IADLs,pt  does not drive     Hand Dominance        Extremity/Trunk Assessment   Upper Extremity Assessment Upper Extremity Assessment: Generalized weakness    Lower Extremity Assessment Lower Extremity Assessment: Generalized weakness    Cervical / Trunk Assessment Cervical / Trunk Assessment: Kyphotic  Communication   Communication: No difficulties  Cognition Arousal/Alertness: Awake/alert Behavior During Therapy: WFL for tasks assessed/performed Overall Cognitive Status: Within  Functional Limits for tasks assessed                                          General Comments      Exercises     Assessment/Plan    PT Assessment Patient does not need any further PT services  PT Problem List         PT Treatment Interventions      PT Goals (Current goals can be found in the Care Plan section)  Acute Rehab PT Goals PT Goal Formulation: All assessment and education complete, DC therapy    Frequency       Co-evaluation               AM-PAC PT "6 Clicks" Mobility  Outcome Measure Help needed turning from your back to your side while in a flat bed without using bedrails?: A Little Help needed moving from lying on your back to sitting on the side of a flat bed without using bedrails?: A Little Help needed moving to and from a bed to a chair (including a wheelchair)?: A Little Help needed standing up from a chair using your arms (e.g., wheelchair or bedside chair)?: A Little Help needed to walk in hospital room?: A Little Help needed climbing 3-5 steps with a railing? : A Little 6 Click Score: 18    End of Session Equipment Utilized During Treatment: Oxygen Activity Tolerance: Patient tolerated treatment well Patient left: in chair;with call bell/phone within reach;with family/visitor present Nurse Communication: Mobility status PT Visit Diagnosis: Other abnormalities of gait and mobility (R26.89);Muscle weakness (generalized) (M62.81)    Time: 1030-1050 PT Time Calculation (min) (ACUTE ONLY): 20 min   Charges:   PT Evaluation $PT Eval Low Complexity: 1 Low          [place], PT Acute Rehabilitation Services Office: [phone]   Lamarr Lulas 08/06/2022, 11:27 AM

## 2022-08-06 NOTE — Progress Notes (Signed)
Chaplain responded to a consult request for Advance Directive education.   Chaplain provided Ms Weatherley with the Advance Directive packet as well as education on Advance Directives-documents an individual completes to communicate their health care directions in advance of a time when they may need them. Chaplain informed pt the documents which may be completed here in the hospital are the Living Will and [place].   Chaplain informed that the [place] is a legal document in which an individual names another person, their Arabi, to make health care decisions when the individual is not able to make them for themselves. The Health Care Agent's function can be temporary or permanent depending on the pt's ability to make and communicate those decisions independently. Chaplain informed pt in the absence of a Health Care Power of [place], the state of [place] directs health care providers to look to the following individuals in the order listed: legal guardian; an attorney?in?fact under a general power of attorney (POA) if that POA includes the right to make health care decisions; a husband or wife; a 27 of parents and adult children; a 18 of adult brothers and sisters; or an individual who has an established relationship with you, who is acting in good faith and who can convey your wishes.  If none of these person are available or willing to make medical decisions on a patient's behalf, the law allows the patient's doctor to make decisions for them as long as another doctor agrees with those decisions.  Chaplain also informed the patient that the Health Care agent has no decision-making authority over any affairs other than those related to his or her medical care.   The chaplain further educated the pt that a Living Will is a legal document that allows an individual to state his or her desire not to receive life-prolonging measures in the event  that they have a condition that is incurable and will result in their death in a short period of time; they are unconscious, and doctors are confident that they will not regain consciousness; and/or they have advanced dementia or other substantial and irreversible loss of mental function. The chaplain informed pt that life-prolonging measures are medical treatments that would only serve to postpone death, including breathing machines, kidney dialysis, antibiotics, artificial nutrition and hydration (tube feeding), and similar forms of treatment and that if an individual is able to express their wishes, they may also make them known without the use of a Living Will, but in the event that an individual is not able to express their wishes themselves, a Living Will allows medical providers and the pt's family and friends ensure that they are not making decisions on the pt's behalf, but rather serving as the pt's voice to convey decisions the pt has already made.   The patient is aware that the decision to create an advance directive is theirs alone and they may chose not to complete the documents or may chose to complete one portion or both.  The patient was informed that they can revoke the documents at any time by striking through them and writing void or by completing new documents, but that it is also advisable that the individual verbally notify interested parties that their wishes have changed.  They are also aware that the document must be signed in the presence of a notary public and two witnesses and that this can be done while the patient is still admitted to  the hospital or after discharge in the community. If they decide to complete Advance Directives after being discharged from the hospital, they have been advised to notify all interested parties and to provide those documents to their physicians and loved ones in addition to bringing them to the hospital in the event of another hospitalization.   The  chaplain informed the pt that if they desire to proceed with completing Advance Directive Documentation while they are still admitted, notary services are typically available at [facility] between the hours of 1:00 and 3:30 Monday-Thursday.    If the patient is ready to have these documents completed, the patient should request that their nurse place a spiritual care consult and indicate that the patient is ready to have their advance directives notarized so that arrangements for witnesses and notary public can be made.  Please page spiritual care if the patient desires further education or has questions.     Please page as further needs arise.  Donald Prose. Elyn Peers, M.Div. [facility] Chaplain Pager [phone] Office [phone]      08/06/22 1448  Spiritual Encounters  Type of Visit Initial  Care provided to: Patient  Reason for visit Advance directives  OnCall Visit No  Advance Directives (For Healthcare)  Does Patient Have a Medical Advance Directive? No  Would patient like information on creating a medical advance directive? Yes (Inpatient - patient defers creating a medical advance directive at this time - Information given)

## 2022-08-07 DIAGNOSIS — N179 Acute kidney failure, unspecified: Secondary | ICD-10-CM | POA: Diagnosis not present

## 2022-08-07 DIAGNOSIS — N184 Chronic kidney disease, stage 4 (severe): Secondary | ICD-10-CM | POA: Diagnosis not present

## 2022-08-07 LAB — CBC WITH DIFFERENTIAL/PLATELET
Abs Immature Granulocytes: 0.74 10*3/uL — ABNORMAL HIGH (ref 0.00–0.07)
Basophils Absolute: 0.1 10*3/uL (ref 0.0–0.1)
Basophils Relative: 0 %
Eosinophils Absolute: 0 10*3/uL (ref 0.0–0.5)
Eosinophils Relative: 0 %
HCT: 23.7 % — ABNORMAL LOW (ref 36.0–46.0)
Hemoglobin: 7.7 g/dL — ABNORMAL LOW (ref 12.0–15.0)
Immature Granulocytes: 5 %
Lymphocytes Relative: 12 %
Lymphs Abs: 1.7 10*3/uL (ref 0.7–4.0)
MCH: 31.2 pg (ref 26.0–34.0)
MCHC: 32.5 g/dL (ref 30.0–36.0)
MCV: 96 fL (ref 80.0–100.0)
Monocytes Absolute: 1 10*3/uL (ref 0.1–1.0)
Monocytes Relative: 7 %
Neutro Abs: 10.4 10*3/uL — ABNORMAL HIGH (ref 1.7–7.7)
Neutrophils Relative %: 76 %
Platelets: 268 10*3/uL (ref 150–400)
RBC: 2.47 MIL/uL — ABNORMAL LOW (ref 3.87–5.11)
RDW: 18.2 % — ABNORMAL HIGH (ref 11.5–15.5)
WBC: 13.9 10*3/uL — ABNORMAL HIGH (ref 4.0–10.5)
nRBC: 0 % (ref 0.0–0.2)

## 2022-08-07 LAB — CULTURE, BLOOD (ROUTINE X 2)
Special Requests: ADEQUATE
Special Requests: ADEQUATE

## 2022-08-07 LAB — BASIC METABOLIC PANEL
Anion gap: 15 (ref 5–15)
BUN: 76 mg/dL — ABNORMAL HIGH (ref 8–23)
CO2: 18 mmol/L — ABNORMAL LOW (ref 22–32)
Calcium: 7.1 mg/dL — ABNORMAL LOW (ref 8.9–10.3)
Chloride: 103 mmol/L (ref 98–111)
Creatinine, Ser: 5.47 mg/dL — ABNORMAL HIGH (ref 0.44–1.00)
GFR, Estimated: 7 mL/min — ABNORMAL LOW (ref >60)
Glucose, Bld: 128 mg/dL — ABNORMAL HIGH (ref 70–99)
Potassium: 4.6 mmol/L (ref 3.5–5.1)
Sodium: 136 mmol/L (ref 135–145)

## 2022-08-07 NOTE — Progress Notes (Signed)
[facility] for Infectious Disease  Date of Admission:  08/03/2022   Total days of inpatient antibiotics 3  Principal Problem:   Acute renal failure superimposed on stage 4 chronic kidney disease (HCC) Active Problems:   Hyperlipidemia   History of anemia due to chronic kidney disease   Hyperkalemia   GERD (gastroesophageal reflux disease)   Restless leg syndrome   High anion gap metabolic acidosis   SIRS (systemic inflammatory response syndrome) (HCC)   Leukocytosis   History of COPD          Assessment: 27 YF with CKD stage IV, renal cell carcinoma status post total left nephrectomy 1999 presented to ED due to elevated creatinine at 6.8K.  Baseline creatinine is 2.6-3.6.   #E faecalis bacteremia likely secondary to pyelonephritis #Pyelonephritis #AKI on CKD - CT renal study showed right hydronephrosis despite ureteral stent, may represent stent malfunction.  Stranding along the right ureter may indicate pyonephritis. - Blood culture from 2/24 + E faecalis, patient on ampicillin - Pt states she has been on abx till Friday for an " infection", does not know the name.   #History of pericarditis 06/20/22 TTE showed pericardial effusion.     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 -Pt followed by oncology, Dr. Luanna Salk. Chemotherapy started 05/08/22 via port -Reached out to Dr. Karle Starch, ok with port removal  Recommendations: -Remove port  -Repeat blood Cx to ensure clearance -TTE ordered -Continue Ampicillin   Microbiology:   Antibiotics: Ampicillin Cultures: Blood 2/24 2/2  Efaecalis 2/24 ng Urine 2/21 E faecalis   SUBJECTIVE: Resting in bed. No new compalitns Interval:   Review of Systems: Review of Systems  All other systems reviewed and are negative.    Scheduled Meds:  atorvastatin  40 mg Oral Daily   ferrous sulfate  325 mg Oral Q breakfast   fluticasone furoate-vilanterol  1 puff Inhalation  Daily   metoprolol tartrate  12.5 mg Oral BID   pantoprazole  40 mg Oral Daily   rOPINIRole  2 mg Oral BID   sodium bicarbonate  650 mg Oral TID   umeclidinium bromide  1 puff Inhalation Daily   Continuous Infusions:  ampicillin (OMNIPEN) IV 2 g (08/07/22 0829)   PRN Meds:.acetaminophen **OR** acetaminophen, levalbuterol, melatonin, ondansetron (ZOFRAN) IV Allergies  Allergen Reactions   Contrast Media [Iodinated Contrast Media] Hives, Itching and Rash   Omeprazole Itching   Shellfish Allergy Nausea And Vomiting   Shellfish-Derived Products Nausea And Vomiting    OBJECTIVE: Vitals:   08/07/22 0217 08/07/22 0400 08/07/22 0809 08/07/22 1112  BP:  100/61 96/62 (!) 100/57  Pulse: 80 82 89 77  Resp: '20 20 18 17  '$ Temp:   (!) 97.3 F (36.3 C) (!) 97.3 F (36.3 C)  TempSrc:   Oral Axillary  SpO2: 96% 95% 97% 100%  Weight: 74.9 kg     Height:       Body mass index is 30.19 kg/m.  Physical Exam Constitutional:      Appearance: Normal appearance.  HENT:     Head: Normocephalic and atraumatic.     Right Ear: Tympanic membrane normal.     Left Ear: Tympanic membrane normal.     Nose: Nose normal.     Mouth/Throat:     Mouth: Mucous membranes are moist.  Eyes:     Extraocular Movements: Extraocular movements intact.     Conjunctiva/sclera: Conjunctivae normal.  Pupils: Pupils are equal, round, and reactive to light.  Cardiovascular:     Rate and Rhythm: Normal rate and regular rhythm.     Heart sounds: No murmur heard.    No friction rub. No gallop.  Pulmonary:     Effort: Pulmonary effort is normal.     Breath sounds: Normal breath sounds.  Abdominal:     General: Abdomen is flat.     Palpations: Abdomen is soft.  Musculoskeletal:        General: Normal range of motion.  Skin:    General: Skin is warm and dry.  Neurological:     General: No focal deficit present.     Mental Status: She is alert and oriented to person, place, and time.  Psychiatric:         Mood and Affect: Mood normal.       Lab Results Lab Results  Component Value Date   WBC 13.9 (H) 08/07/2022   HGB 7.7 (L) 08/07/2022   HCT 23.7 (L) 08/07/2022   MCV 96.0 08/07/2022   PLT 268 08/07/2022    Lab Results  Component Value Date   CREATININE 5.47 (H) 08/07/2022   BUN 76 (H) 08/07/2022   NA 136 08/07/2022   K 4.6 08/07/2022   CL 103 08/07/2022   CO2 18 (L) 08/07/2022    Lab Results  Component Value Date   ALT 16 08/04/2022   AST 18 08/04/2022   ALKPHOS 65 08/04/2022   BILITOT 0.6 08/04/2022        Laurice Record, [facility] 08/07/2022, 11:26 AM

## 2022-08-07 NOTE — Progress Notes (Signed)
3 Days Post-Op Subjective: No acute events overnight. UOP down somewhat but creatinine trended down over last two days.   Objective: Vital signs in last 24 hours: Temp:  [97.3 F (36.3 C)-98.2 F (36.8 C)] 98.2 F (36.8 C) (02/27 1538) Pulse Rate:  [77-89] 83 (02/27 1538) Resp:  [17-22] 22 (02/27 1538) BP: (96-117)/(47-79) 114/79 (02/27 1538) SpO2:  [95 %-100 %] 98 % (02/27 1538) Weight:  [74.9 kg] 74.9 kg (02/27 0217)  Intake/Output from previous day: 02/26 0701 - 02/27 0700 In: 240 [P.O.:240] Out: 400 [Urine:400] Intake/Output this shift: Total I/O In: 240 [P.O.:240] Out: 600 [Urine:600]  Physical Exam:  General: Alert and oriented CV: RRR Lungs: Clear Abdomen: Soft, ND, ATTP Ext: NT, No erythema  Lab Results: Recent Labs    08/06/22 0020 08/07/22 0011  HGB 7.3* 7.7*  HCT 22.2* 23.7*    BMET Recent Labs    08/06/22 0020 08/07/22 0011  NA 133* 136  K 5.0 4.6  CL 101 103  CO2 19* 18*  GLUCOSE 139* 128*  BUN 79* 76*  CREATININE 6.08* 5.47*  CALCIUM 7.2* 7.1*      Studies/Results: No results found.  Assessment/Plan: Alexandra [name] is POD 3 s/p right ureteral stent exchange; Kidney function trending better  --patient may f/u with GU as outpatient in ~6 weeks to set up next stent exchange --please call with any further questions or if kidney function begins to worsen again - likely would recommend repeat imaging if this were the case   LOS: 4 days   Donald Pore MD 08/07/2022, 3:53 PM [facility]  Pager: [phone]

## 2022-08-07 NOTE — Progress Notes (Signed)
[facility] NEPHROLOGY PROGRESS NOTE  Assessment/ Plan: Pt is a 82 y.o. yo female with hypertension, renal cell carcinoma status post left total nephrectomy in 1999, COPD, CKD stage IV admitted with decreased urine output and abnormal lab results including hyperkalemia and AKI.  # Acute kidney injury secondary to obstructive uropathy in the solitary kidney, also with ischemic and prerenal insults with marginal blood pressure and bacteremia.  Status post ureteral stent exchange by urology. She has marginal urine output however both BUN and creatinine level are gradually trending down.  On antibiotics for possible pyelonephritis.  I will continue to watch for renal recovery, daily lab, strict ins and out.  Expect renal recovery, no need for dialysis.   # Obstructive uropathy status post right ureteral stent exchange by urologist on 2/24.  Management per urology.   # E faecalis bacteremia likely due to pyelonephritis: On ampicillin, TTE and ID recommendation.      # Hyperkalemia: Improved with Lokelma, bicarbonate.  Monitor lab.   # Metabolic acidosis due to AKI, continue oral sodium bicarbonate.   # Solitary kidney  - note hx of remote left total nephrectomy for renal cell   # CKD stage IV - Baseline Cr near 3 and follows with  Kidney - Dr. Hollie Salk   # Hypertension: Blood pressure variable with some episode of hypotension.  Currently only on low-dose metoprolol.  # Anemia: Drop in hemoglobin noted, no bleeding.  Iron stores acceptable.  Received Aranesp on 2/26.    Subjective: [name].  Urine output is recorded around 400 cc.  Denies nausea, vomiting, chest pain, shortness of breath.  The creatinine level is trending down to 5.4 from 6.08.  No major event.  Objective Vital signs in last 24 hours: Vitals:   08/07/22 0200 08/07/22 0217 08/07/22 0400 08/07/22 0809  BP: 117/63  100/61 96/62  Pulse: 79 80 82 89  Resp: (!) '21 20 20 18  '$ Temp: (!)  97.5 F (36.4 C)   (!) 97.3 F (36.3 C)  TempSrc: Oral   Oral  SpO2: 95% 96% 95% 97%  Weight:  74.9 kg    Height:       Weight change: 2.2 kg  Intake/Output Summary (Last 24 hours) at 08/07/2022 0908 Last data filed at 08/07/2022 0819 Gross per 24 hour  Intake 240 ml  Output 900 ml  Net -660 ml        Labs: RENAL PANEL Recent Labs    05/21/22 1041 05/23/22 0919 05/30/22 0825 06/06/22 0917 06/13/22 1021 07/03/22 1630 07/31/22 1415 08/03/22 1030 08/03/22 1749 08/03/22 2107 08/04/22 0257 08/04/22 0837 08/04/22 1810 08/05/22 0018 08/06/22 0020 08/07/22 0011  NA 140 138 136 139 132* 146* 136 132* 130* 133* 135 132* 134* 135 133* 136  K 6.0* 5.2* 5.4* 4.9 5.1 5.6* 6.3* 6.7* 6.4* 5.0 5.0 5.2* 5.7* 5.6* 5.0 4.6  CL 109 107 109 112* '107 104 99 100 99 102 101 98 101 101 '$ 101 103  CO2 24 22 20* 19* 18* 20 18* 17* 15* 16* 19* 19* 20* 20* 19* 18*  GLUCOSE 113* 107* 95 104* 108* 115* 100* 96 101* 95 105* 104* 104* 202* 139* 128*  BUN 42* 51* 57* 55* 70* 110* 56* 84* 86* 84* 85* 81* 79* 81* 79* 76*  CREATININE 2.62* 2.69* 2.58* 2.97* 3.05* 3.58* 3.68* 6.84* 6.88* 6.74* 6.81* 6.87* 6.61* 6.57* 6.08* 5.47*  CALCIUM 9.2 9.0 8.6* 8.3* 8.2* 8.9 8.8 8.1* 7.9* 8.1* 7.9* 7.7* 7.6* 7.6*  7.2* 7.1*  MG  --   --   --   --   --   --   --   --   --  1.7 1.7  --   --   --   --   --   PHOS  --   --   --   --   --   --   --   --   --   --  5.5*  --   --  6.3*  --   --   ALBUMIN 3.2* 2.9* 3.2* 3.0* 3.1* 3.5* 3.5* 3.3*  --   --  2.1*  --   --  2.0*  --   --       Liver Function Tests: Recent Labs  Lab 07/31/22 1415 08/03/22 1030 08/04/22 0257 08/05/22 0018  AST '18 18 18  '$ --   ALT '19 15 16  '$ --   ALKPHOS 99 95 65  --   BILITOT 0.3 0.4 0.6  --   PROT 6.3 5.4* 5.2*  --   ALBUMIN 3.5* 3.3* 2.1* 2.0*    No results for input(s): "LIPASE", "AMYLASE" in the last 168 hours. No results for input(s): "AMMONIA" in the last 168 hours. CBC: Recent Labs    07/31/22 1415 08/03/22 1749  08/04/22 0257 08/05/22 1201 08/06/22 0020 08/07/22 0011  HGB 9.8* 9.2* 7.9*  --  7.3* 7.7*  MCV 94 96.3 94.4  --  93.7 96.0  FERRITIN  --   --   --  883*  --   --   TIBC  --   --   --  190*  --   --   IRON  --   --   --  77  --   --      Cardiac Enzymes: Recent Labs  Lab 08/03/22 2107  CKTOTAL 43    CBG: No results for input(s): "GLUCAP" in the last 168 hours.  Iron Studies:  Recent Labs    08/05/22 1201  IRON 77  TIBC 190*  FERRITIN 883*    Studies/Results: No results found.  Medications: Infusions:  ampicillin (OMNIPEN) IV 2 g (08/07/22 0829)    Scheduled Medications:  atorvastatin  40 mg Oral Daily   ferrous sulfate  325 mg Oral Q breakfast   fluticasone furoate-vilanterol  1 puff Inhalation Daily   metoprolol tartrate  12.5 mg Oral BID   pantoprazole  40 mg Oral Daily   rOPINIRole  2 mg Oral BID   sodium bicarbonate  650 mg Oral TID   umeclidinium bromide  1 puff Inhalation Daily    have reviewed scheduled and prn medications.  Physical Exam: General:NAD, comfortable Heart:RRR, s1s2 nl Lungs:clear b/l, no crackle Abdomen:soft, Non-tender, non-distended Extremities:No edema Neurology: Alert, awake and following commands.  Jaylen Knope Prasad Kamron Vanwyhe 08/07/2022,9:08 AM  LOS: 4 days

## 2022-08-07 NOTE — Plan of Care (Signed)

## 2022-08-07 NOTE — Progress Notes (Signed)
PROGRESS NOTE  Alexandra [name]  DOB: [birth year]  PCP: Rochel Brome, MD DL:[id number]  DOA: 08/03/2022  LOS: 4 days  Hospital Day: 5  Brief narrative: Alexandra [name] is a 82 y.o. female with PMH significant for HTN, HLD, CKD 4, COPD on 2 L oxygen at home., GERD, restless legs, per parathyroidism, RCC s/p left total nephrectomy 1999, anemia chronic disease. 2/23, patient was sent to the ED after noted to have elevated creatinine and potassium level in outpatient lab done on 2/20.  Follows up with nephrologist Dr. Hollie Salk.  No recent symptoms.  In the ED, patient was afebrile, hemodynamically stable with blood pressure in 100s Initial labs with sodium 132, potassium 6.7, BUN/creatinine 80/6.84, WC count elevated to 24,000, hemoglobin low at 9.2 EDP discussed with nephrologist Dr. Royce Macadamia.  Patient received temporizing measures for hyperkalemia with sodium bicarb, insulin, calcium gluconate, Lokelma.  Urinalysis showed hazy yellow urine, moderate hemoglobin, large leukocytes, rare bacteria. Ultrasound renal was obtained which showed a right sided ureteral stent with moderate severity right-sided hydronephrosis and proximal hydroureter. CT renal study confirmed the finding suggesting stent malfunction.  Also suggested possible right pyelonephritis. Urology consultation was obtained.  Admitted to [facility] Blood cultures sent on admission is growing Enterococcus faecalis in both bottles.  Subjective: Patient was seen and examined this morning. Sitting up in recliner.  Not in distress.  No new symptoms.  Pending TTE.  Renal function improving.  Assessment and plan: Sepsis secondary to acute right pyelonephritis POA Enterococcus faecalis bacteremia Urinalysis showed large leukocytes WBC count significantly elevated to 24,000 CT scan suggestive of acute pyonephritis Urine culture grew > 100,000 CFU per mL of Enterococcus faecalis.  Blood culture Enterococcus faecalis as well.  She was initially  started on IV Rocephin, later switched to IV ampicillin.  Pending final susceptibility of blood culture. ID following.  Pending TTE. WBC count improving.  Lactic acid level normal. Clinically improving.  Continue to monitor. Recent Labs  Lab 07/31/22 1415 08/03/22 1749 08/04/22 0257 08/04/22 0940 08/06/22 0020 08/07/22 0011  WBC 11.6* 23.9* 18.6*  --  14.5* 13.9*  LATICACIDVEN  --   --   --  0.6  --   --     Acute hyperkalemia AKI on CKD 4 Anion gap metabolic acidosis Sent for significant rise in potassium and creatinine level and outpatient blood work. Labs in the ED found severe hyperkalemia at 6.7 and creatinine elevated to 6.88. Acute renal failure and hyperkalemia likely secondary to obstructive uropathy in the setting of solitary right kidney. Initially given membrane stabilizing agents for hyperkalemia. Lab trend as below.  Potassium level normalized.  Creatinine steadily improving, 5.47 today. Not on IV fluid.   Continue sodium bicarb 650 mg 3 times daily. Recent Labs  Lab 08/03/22 2107 08/04/22 0257 08/04/22 0837 08/04/22 1810 08/05/22 0018 08/06/22 0020 08/07/22 0011  NA 133* 135 132* 134* 135 133* 136  K 5.0 5.0 5.2* 5.7* 5.6* 5.0 4.6  CL 102 101 98 101 101 101 103  CO2 16* 19* 19* 20* 20* 19* 18*  GLUCOSE 95 105* 104* 104* 202* 139* 128*  BUN 84* 85* 81* 79* 81* 79* 76*  CREATININE 6.74* 6.81* 6.87* 6.61* 6.57* 6.08* 5.47*  CALCIUM 8.1* 7.9* 7.7* 7.6* 7.6* 7.2* 7.1*  MG 1.7 1.7  --   --   --   --   --   PHOS  --  5.5*  --   --  6.3*  --   --  Obstructive uropathy Right hydronephrosis with stent in place Urology consult obtained.  2/24, underwent cystoscopy and right ureteral stent placement.  Per operative report, there is no suspicious lesions, masses or stones.  Hypotension  H/o hypertension Blood pressure is running low normal. PTA on metoprolol tartrate 25 mg twice daily.  Currently continued on low-dose at 12.5 mg twice daily.  Acute on  chronic anemia Baseline hemoglobin more than 10.  Lately running low.  Noted a drop in hemoglobin to 7.9 on 2/24.  No evidence of active bleeding.  Hemoglobin low at 7.7 today.  Transfuse if less than 7. Continue iron supplement. Continue to monitor. Recent Labs    07/31/22 1415 08/03/22 1749 08/04/22 0257 08/05/22 1201 08/06/22 0020 08/07/22 0011  HGB 9.8* 9.2* 7.9*  --  7.3* 7.7*  MCV 94 96.3 94.4  --  93.7 96.0  FERRITIN  --   --   --  883*  --   --   TIBC  --   --   --  190*  --   --   IRON  --   --   --  77  --   --     COPD Chronic respiratory failure with hypoxia Past history of smoking COPD on 2 L oxygen at home.  Remains on 2 L.. Continue bronchodilators as needed   GERD PPI  Hyperlipidemia Lipitor  Restless leg syndrome Continue Requip.     Mobility: PT eval obtained.  No PT follow-up recommended.  Goals of care   Code Status: Full Code     DVT prophylaxis:  SCDs Start: 08/03/22 1957   Antimicrobials: IV ampicillin Fluid: Per nephrology. Consultants: Nephrology, urology, ID Family Communication: None at bedside  Status is: Inpatient Level of care: Progressive   Dispo: Patient is from: Home              Anticipated d/c is to: Pending clinical course Continue in-hospital care because: AKI gradually improving, pending echo   Scheduled Meds:  atorvastatin  40 mg Oral Daily   ferrous sulfate  325 mg Oral Q breakfast   fluticasone furoate-vilanterol  1 puff Inhalation Daily   metoprolol tartrate  12.5 mg Oral BID   pantoprazole  40 mg Oral Daily   rOPINIRole  2 mg Oral BID   sodium bicarbonate  650 mg Oral TID   umeclidinium bromide  1 puff Inhalation Daily    PRN meds: acetaminophen **OR** acetaminophen, levalbuterol, melatonin, ondansetron (ZOFRAN) IV   Infusions:   ampicillin (OMNIPEN) IV 2 g (08/07/22 0829)    Diet:  Diet Order             Diet regular Room service appropriate? Yes; Fluid consistency: Thin  Diet effective now                    Antimicrobials: Anti-infectives (From admission, onward)    Start     Dose/Rate Route Frequency Ordered Stop   08/05/22 0600  ampicillin (OMNIPEN) 2 g in sodium chloride 0.9 % 100 mL IVPB        2 g 300 mL/hr over 20 Minutes Intravenous 2 times daily 08/05/22 0413     08/04/22 0915  cefTRIAXone (ROCEPHIN) 1 g in sodium chloride 0.9 % 100 mL IVPB  Status:  Discontinued        1 g 200 mL/hr over 30 Minutes Intravenous Every 24 hours 08/04/22 0904 08/05/22 0413       Skin assessment:  Nutritional status:  Body mass index is 30.19 kg/m.  Nutrition Problem: Increased nutrient needs Etiology: acute illness (pyelonephritis, bacteremia) Signs/Symptoms: estimated needs     Objective: Vitals:   08/07/22 0809 08/07/22 1112  BP: 96/62 (!) 100/57  Pulse: 89 77  Resp: 18 17  Temp: (!) 97.3 F (36.3 C) (!) 97.3 F (36.3 C)  SpO2: 97% 100%    Intake/Output Summary (Last 24 hours) at 08/07/2022 1315 Last data filed at 08/07/2022 1112 Gross per 24 hour  Intake 240 ml  Output 1000 ml  Net -760 ml    Filed Weights   08/05/22 0406 08/06/22 0454 08/07/22 0217  Weight: 67 kg 72.7 kg 74.9 kg   Weight change: 2.2 kg Body mass index is 30.19 kg/m.   Physical Exam: General exam: Pleasant, elderly Caucasian female.  Not in pain Skin: No rashes, lesions or ulcers. HEENT: Atraumatic, normocephalic, no obvious bleeding Lungs: Clear to auscultation bilaterally CVS: Regular rate and rhythm, no murmur GI/Abd soft nontender, nondistended, bowel sound present CNS: Alert, awake, oriented x 3 Psychiatry: Mood appropriate Extremities: No pedal edema, no calf tenderness  Data Review: I have personally reviewed the laboratory data and studies available.  F/u labs ordered Unresulted Labs (From admission, onward)     Start     Ordered   08/08/22 0500  Albumin  Tomorrow morning,   R       Question:  Specimen collection method  Answer:  Lab=Lab collect    08/07/22 0911   08/06/22 [id number]  Basic metabolic panel  Daily,   R     Question:  Specimen collection method  Answer:  Lab=Lab collect   08/05/22 0803   08/06/22 0500  CBC with Differential/Platelet  Daily,   R     Question:  Specimen collection method  Answer:  Lab=Lab collect   08/05/22 0803            Total time spent in review of labs and imaging, patient evaluation, formulation of plan, documentation and communication with family: 65 minutes  Signed, Terrilee Croak, MD [facility] 08/07/2022

## 2022-08-08 ENCOUNTER — Inpatient Hospital Stay (HOSPITAL_COMMUNITY): Payer: Medicare Other

## 2022-08-08 DIAGNOSIS — I38 Endocarditis, valve unspecified: Secondary | ICD-10-CM

## 2022-08-08 DIAGNOSIS — N184 Chronic kidney disease, stage 4 (severe): Secondary | ICD-10-CM | POA: Diagnosis not present

## 2022-08-08 DIAGNOSIS — N179 Acute kidney failure, unspecified: Secondary | ICD-10-CM | POA: Diagnosis not present

## 2022-08-08 HISTORY — PX: IR REMOVAL TUN ACCESS W/ PORT W/O FL MOD SED: IMG2290

## 2022-08-08 LAB — CBC WITH DIFFERENTIAL/PLATELET
Abs Immature Granulocytes: 0 10*3/uL (ref 0.00–0.07)
Basophils Absolute: 0 10*3/uL (ref 0.0–0.1)
Basophils Relative: 0 %
Eosinophils Absolute: 0 10*3/uL (ref 0.0–0.5)
Eosinophils Relative: 0 %
HCT: 25.7 % — ABNORMAL LOW (ref 36.0–46.0)
Hemoglobin: 7.9 g/dL — ABNORMAL LOW (ref 12.0–15.0)
Lymphocytes Relative: 10 %
Lymphs Abs: 1.3 10*3/uL (ref 0.7–4.0)
MCH: 30.3 pg (ref 26.0–34.0)
MCHC: 30.7 g/dL (ref 30.0–36.0)
MCV: 98.5 fL (ref 80.0–100.0)
Monocytes Absolute: 0.9 10*3/uL (ref 0.1–1.0)
Monocytes Relative: 7 %
Neutro Abs: 11.1 10*3/uL — ABNORMAL HIGH (ref 1.7–7.7)
Neutrophils Relative %: 83 %
Platelets: 248 10*3/uL (ref 150–400)
RBC: 2.61 MIL/uL — ABNORMAL LOW (ref 3.87–5.11)
RDW: 18.5 % — ABNORMAL HIGH (ref 11.5–15.5)
WBC: 13.4 10*3/uL — ABNORMAL HIGH (ref 4.0–10.5)
nRBC: 0 % (ref 0.0–0.2)
nRBC: 0 /100 WBC

## 2022-08-08 LAB — BASIC METABOLIC PANEL
Anion gap: 12 (ref 5–15)
BUN: 73 mg/dL — ABNORMAL HIGH (ref 8–23)
CO2: 20 mmol/L — ABNORMAL LOW (ref 22–32)
Calcium: 7.4 mg/dL — ABNORMAL LOW (ref 8.9–10.3)
Chloride: 103 mmol/L (ref 98–111)
Creatinine, Ser: 5.13 mg/dL — ABNORMAL HIGH (ref 0.44–1.00)
GFR, Estimated: 8 mL/min — ABNORMAL LOW (ref >60)
Glucose, Bld: 110 mg/dL — ABNORMAL HIGH (ref 70–99)
Potassium: 4.6 mmol/L (ref 3.5–5.1)
Sodium: 135 mmol/L (ref 135–145)

## 2022-08-08 LAB — ECHOCARDIOGRAM COMPLETE BUBBLE STUDY
Area-P 1/2: 3.27 cm2
Calc EF: 57.1 %
MV M vel: 2 m/s
MV Peak grad: 16 mmHg
MV VTI: 1.88 cm2
S' Lateral: 2.7 cm
Single Plane A2C EF: 55.9 %
Single Plane A4C EF: 61.3 %

## 2022-08-08 LAB — ALBUMIN: Albumin: 1.8 g/dL — ABNORMAL LOW (ref 3.5–5.0)

## 2022-08-08 MED ORDER — SODIUM CHLORIDE 0.9 % IV SOLN
2.0000 g | Freq: Three times a day (TID) | INTRAVENOUS | Status: DC
  Administered 2022-08-08 – 2022-08-10 (×7): 2 g via INTRAVENOUS
  Filled 2022-08-08 (×7): qty 2000

## 2022-08-08 MED ORDER — LIDOCAINE-EPINEPHRINE 1 %-1:100000 IJ SOLN
INTRAMUSCULAR | Status: AC
  Filled 2022-08-08: qty 1

## 2022-08-08 MED ORDER — ALBUMIN HUMAN 25 % IV SOLN
25.0000 g | Freq: Four times a day (QID) | INTRAVENOUS | Status: AC
  Administered 2022-08-08 (×2): 25 g via INTRAVENOUS
  Filled 2022-08-08 (×2): qty 100

## 2022-08-08 NOTE — Progress Notes (Signed)
Alexandra [name] for Infectious Disease  Date of Admission:  08/03/2022   Total days of inpatient antibiotics 3  Principal Problem:   Acute renal failure superimposed on stage 4 chronic kidney disease (HCC) Active Problems:   Hyperlipidemia   History of anemia due to chronic kidney disease   Hyperkalemia   GERD (gastroesophageal reflux disease)   Restless leg syndrome   High anion gap metabolic acidosis   SIRS (systemic inflammatory response syndrome) (HCC)   Leukocytosis   History of COPD          Assessment: 69 YF with CKD stage IV, renal cell carcinoma status post total left nephrectomy 1999 presented to ED due to elevated creatinine at 6.8K.  Baseline creatinine is 2.6-3.6.   #E faecalis bacteremia likely secondary to pyelonephritis #Pyelonephritis #AKI on CKD - CT renal study showed right hydronephrosis despite ureteral stent, may represent stent malfunction.  Stranding along the right ureter may indicate pyonephritis. - Blood culture from 2/24 + E faecalis, patient on ampicillin - Pt states she has been on abx till Friday for an " infection", does not know the name.   #History of pericarditis 06/20/22 TTE showed pericardial effusion.     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 -Pt followed by oncology, Dr. Luanna Salk. Chemotherapy started 05/08/22 via port -Reached out to Dr. Karle Starch, ok with port removal  Recommendations: -Remove port with IR -Repeat blood Cx  clear so far -Final abx plan pending TTE results -Continue Ampicillin   Microbiology:   Antibiotics: Ampicillin Cultures: Blood 2/24 2/2  Efaecalis 2/24 ng Urine 2/21 E faecalis   SUBJECTIVE: Resting in bed. No new compalitns Interval: Afebrile overnight, wbc 13.4  Review of Systems: Review of Systems  All other systems reviewed and are negative.    Scheduled Meds:  atorvastatin  40 mg Oral Daily   ferrous sulfate  325 mg Oral Q breakfast    fluticasone furoate-vilanterol  1 puff Inhalation Daily   metoprolol tartrate  12.5 mg Oral BID   pantoprazole  40 mg Oral Daily   rOPINIRole  2 mg Oral BID   sodium bicarbonate  650 mg Oral TID   umeclidinium bromide  1 puff Inhalation Daily   Continuous Infusions:  albumin human 25 g (08/08/22 0957)   ampicillin (OMNIPEN) IV Stopped (08/08/22 0927)   PRN Meds:.acetaminophen **OR** acetaminophen, levalbuterol, melatonin, ondansetron (ZOFRAN) IV Allergies  Allergen Reactions   Contrast Media [Iodinated Contrast Media] Hives, Itching and Rash   Omeprazole Itching   Shellfish Allergy Nausea And Vomiting   Shellfish-Derived Products Nausea And Vomiting    OBJECTIVE: Vitals:   08/07/22 2249 08/08/22 0309 08/08/22 0528 08/08/22 0700  BP: (!) 116/50 116/62  (!) 115/55  Pulse: 87 91  (!) 102  Resp: '20 18  20  '$ Temp: 97.8 F (36.6 C) 97.6 F (36.4 C)  97.8 F (36.6 C)  TempSrc: Oral Oral  Oral  SpO2: 96% 94%  97%  Weight:   73.5 kg   Height:       Body mass index is 29.63 kg/m.  Physical Exam Constitutional:      Appearance: Normal appearance.  HENT:     Head: Normocephalic and atraumatic.     Right Ear: Tympanic membrane normal.     Left Ear: Tympanic membrane normal.     Nose: Nose normal.     Mouth/Throat:     Mouth: Mucous membranes are moist.  Eyes:     Extraocular Movements: Extraocular movements intact.     Conjunctiva/sclera: Conjunctivae normal.     Pupils: Pupils are equal, round, and reactive to light.  Cardiovascular:     Rate and Rhythm: Normal rate and regular rhythm.     Heart sounds: No murmur heard.    No friction rub. No gallop.  Pulmonary:     Effort: Pulmonary effort is normal.     Breath sounds: Normal breath sounds.  Abdominal:     General: Abdomen is flat.     Palpations: Abdomen is soft.  Musculoskeletal:        General: Normal range of motion.  Skin:    General: Skin is warm and dry.  Neurological:     General: No focal deficit  present.     Mental Status: She is alert and oriented to person, place, and time.  Psychiatric:        Mood and Affect: Mood normal.       Lab Results Lab Results  Component Value Date   WBC 13.4 (H) 08/08/2022   HGB 7.9 (L) 08/08/2022   HCT 25.7 (L) 08/08/2022   MCV 98.5 08/08/2022   PLT 248 08/08/2022    Lab Results  Component Value Date   CREATININE 5.13 (H) 08/08/2022   BUN 73 (H) 08/08/2022   NA 135 08/08/2022   K 4.6 08/08/2022   CL 103 08/08/2022   CO2 20 (L) 08/08/2022    Lab Results  Component Value Date   ALT 16 08/04/2022   AST 18 08/04/2022   ALKPHOS 65 08/04/2022   BILITOT 0.6 08/04/2022        Laurice Record, [facility] 08/08/2022, 10:36 AM

## 2022-08-08 NOTE — Progress Notes (Signed)
Mobility Specialist - Progress Note   08/08/22 1300  Mobility  Activity Ambulated with assistance in hallway  Level of Assistance Standby assist, set-up cues, supervision of patient - no hands on  Assistive Device Four wheel walker  Distance Ambulated (ft) 150 ft  Activity Response Tolerated well  Mobility Referral Yes  $Mobility charge 1 Mobility    Pt received in recliner and agreeable. No complaints throughout walk, no physical assistance needed. Left in recliner w/ all needs met and call bell in her lap.   [place] Specialist Please contact via SecureChat or Rehab office at [phone]

## 2022-08-08 NOTE — Progress Notes (Signed)
  Echocardiogram 2D Echocardiogram has been performed.  Alexandra [name] 08/08/2022, 10:14 AM

## 2022-08-08 NOTE — Plan of Care (Signed)

## 2022-08-08 NOTE — Progress Notes (Addendum)
[facility] NEPHROLOGY PROGRESS NOTE  Assessment/ Plan: Pt is a 82 y.o. yo female with hypertension, renal cell carcinoma status post left total nephrectomy in 1999, COPD, CKD stage IV admitted with decreased urine output and abnormal lab results including hyperkalemia and AKI.  # Acute kidney injury secondary to obstructive uropathy in the solitary kidney, also with ischemic and prerenal insults with marginal blood pressure and bacteremia.  Status post ureteral stent exchange by urology. Urine output has increased associated with continuing improvement in BUN and creatinine level.  No sign or symptoms of uremia.  No need for dialysis.  Expect continued renal recovery.  Add 2 doses of albumin IV.  Continue daily lab and strict ins and out.   # Obstructive uropathy status post right ureteral stent exchange by urologist on 2/24.  Management per urology.   # E faecalis bacteremia likely due to pyelonephritis: On ampicillin, TTE and ID recommendation.      # Hyperkalemia: Improved with Lokelma, bicarbonate.  Monitor lab.   # Metabolic acidosis due to AKI, continue oral sodium bicarbonate.   # Solitary kidney  - note hx of remote left total nephrectomy for renal cell   # CKD stage IV - Baseline Cr near 3 and follows with [facility] - Dr. Hollie Salk   # Hypertension: Blood pressure variable with some episode of hypotension.  Currently only on low-dose metoprolol.  # Anemia: Drop in hemoglobin noted, no bleeding.  Iron stores acceptable.  Received Aranesp on 2/26.    Subjective: [name].  Urine output increased to 1.5 L.  Denies nausea, vomiting, chest pain, shortness of breath.  No new major health event overnight. Objective Vital signs in last 24 hours: Vitals:   08/07/22 2249 08/08/22 0309 08/08/22 0528 08/08/22 0700  BP: (!) 116/50 116/62  (!) 115/55  Pulse: 87 91  (!) 102  Resp: '20 18  20  '$ Temp: 97.8 F (36.6 C) 97.6 F (36.4 C)  97.8 F (36.6  C)  TempSrc: Oral Oral  Oral  SpO2: 96% 94%  97%  Weight:   73.5 kg   Height:       Weight change: -1.4 kg  Intake/Output Summary (Last 24 hours) at 08/08/2022 0909 Last data filed at 08/08/2022 0700 Gross per 24 hour  Intake 860 ml  Output 1000 ml  Net -140 ml        Labs: RENAL PANEL Recent Labs    05/23/22 0919 05/30/22 0825 06/06/22 0917 06/13/22 1021 07/03/22 1630 07/31/22 1415 08/03/22 1030 08/03/22 1749 08/03/22 2107 08/04/22 0257 08/04/22 0837 08/04/22 1810 08/05/22 0018 08/06/22 0020 08/07/22 0011 08/08/22 0007  NA 138 136 139 132* 146* 136 132* 130* 133* 135 132* 134* 135 133* 136 135  K 5.2* 5.4* 4.9 5.1 5.6* 6.3* 6.7* 6.4* 5.0 5.0 5.2* 5.7* 5.6* 5.0 4.6 4.6  CL 107 109 112* '107 104 99 100 99 102 101 98 101 101 '$ 101 103 103  CO2 22 20* 19* 18* 20 18* 17* 15* 16* 19* 19* 20* 20* 19* 18* 20*  GLUCOSE 107* 95 104* 108* 115* 100* 96 101* 95 105* 104* 104* 202* 139* 128* 110*  BUN 51* 57* 55* 70* 110* 56* 84* 86* 84* 85* 81* 79* 81* 79* 76* 73*  CREATININE 2.69* 2.58* 2.97* 3.05* 3.58* 3.68* 6.84* 6.88* 6.74* 6.81* 6.87* 6.61* 6.57* 6.08* 5.47* 5.13*  CALCIUM 9.0 8.6* 8.3* 8.2* 8.9 8.8 8.1* 7.9* 8.1* 7.9* 7.7* 7.6* 7.6* 7.2* 7.1* 7.4*  MG  --   --   --   --   --   --   --   --  1.7 1.7  --   --   --   --   --   --   PHOS  --   --   --   --   --   --   --   --   --  5.5*  --   --  6.3*  --   --   --   ALBUMIN 2.9* 3.2* 3.0* 3.1* 3.5* 3.5* 3.3*  --   --  2.1*  --   --  2.0*  --   --  1.8*      Liver Function Tests: Recent Labs  Lab 08/03/22 1030 08/04/22 0257 08/05/22 0018 08/08/22 0007  AST 18 18  --   --   ALT 15 16  --   --   ALKPHOS 95 65  --   --   BILITOT 0.4 0.6  --   --   PROT 5.4* 5.2*  --   --   ALBUMIN 3.3* 2.1* 2.0* 1.8*    No results for input(s): "LIPASE", "AMYLASE" in the last 168 hours. No results for input(s): "AMMONIA" in the last 168 hours. CBC: Recent Labs    08/03/22 1749 08/04/22 0257 08/05/22 1201 08/06/22 0020  08/07/22 0011 08/08/22 0007  HGB 9.2* 7.9*  --  7.3* 7.7* 7.9*  MCV 96.3 94.4  --  93.7 96.0 98.5  FERRITIN  --   --  883*  --   --   --   TIBC  --   --  190*  --   --   --   IRON  --   --  77  --   --   --      Cardiac Enzymes: Recent Labs  Lab 08/03/22 2107  CKTOTAL 43    CBG: No results for input(s): "GLUCAP" in the last 168 hours.  Iron Studies:  Recent Labs    08/05/22 1201  IRON 77  TIBC 190*  FERRITIN 883*    Studies/Results: No results found.  Medications: Infusions:  ampicillin (OMNIPEN) IV 2 g (08/08/22 0907)    Scheduled Medications:  atorvastatin  40 mg Oral Daily   ferrous sulfate  325 mg Oral Q breakfast   fluticasone furoate-vilanterol  1 puff Inhalation Daily   metoprolol tartrate  12.5 mg Oral BID   pantoprazole  40 mg Oral Daily   rOPINIRole  2 mg Oral BID   sodium bicarbonate  650 mg Oral TID   umeclidinium bromide  1 puff Inhalation Daily    have reviewed scheduled and prn medications.  Physical Exam: General:NAD, comfortable Heart:RRR, s1s2 nl Lungs:clear b/l, no crackle Abdomen:soft, Non-tender, non-distended Extremities: Trace peripheral lower extremities edema Neurology: Alert, awake and following commands.  Rylynne Schicker Reesa Chew Linzee Depaul 08/08/2022,9:09 AM  LOS: 5 days

## 2022-08-08 NOTE — Progress Notes (Signed)
PROGRESS NOTE  Alexandra [name]  DOB: Nov 23, 1940  PCP: Rochel Brome, MD SJ:[id number]  DOA: 08/03/2022  LOS: 5 days  Hospital Day: 6  Brief narrative: Alexandra [name] is a 82 y.o. female with PMH significant for HTN, HLD, CKD 4, COPD on 2 L oxygen at home., GERD, restless legs, per parathyroidism, RCC s/p left total nephrectomy 1999, anemia chronic disease. 2/23, patient was sent to the ED after noted to have elevated creatinine and potassium level in outpatient lab done on 2/20.  Follows up with nephrologist Dr. Hollie Salk.  No recent symptoms.  In the ED, patient was afebrile, hemodynamically stable with blood pressure in 100s Initial labs with sodium 132, potassium 6.7, BUN/creatinine 80/6.84, WC count elevated to 24,000, hemoglobin low at 9.2 EDP discussed with nephrologist Dr. Royce Macadamia.  Patient received temporizing measures for hyperkalemia with sodium bicarb, insulin, calcium gluconate, Lokelma.  Urinalysis showed hazy yellow urine, moderate hemoglobin, large leukocytes, rare bacteria. Ultrasound renal was obtained which showed a right sided ureteral stent with moderate severity right-sided hydronephrosis and proximal hydroureter. CT renal study confirmed the finding suggesting stent malfunction.  Also suggested possible right pyelonephritis. Urology consultation was obtained.  Admitted to [facility] Blood cultures sent on admission is growing Enterococcus faecalis in both bottles.  Subjective: Patient was seen and examined this morning. Lying on bed.  Not in distress per no new symptoms.  Renal function improving.  Undergoing TTE this morning.  Assessment and plan: Sepsis secondary to acute right pyelonephritis POA Enterococcus faecalis bacteremia Urinalysis showed large leukocytes WBC count significantly elevated to 24,000 CT scan suggestive of acute pyonephritis Urine culture grew > 100,000 CFU per mL of Enterococcus faecalis.  Blood culture Enterococcus faecalis as well.  She was  initially started on IV Rocephin, later switched to IV ampicillin.  Pending final susceptibility of blood culture. ID following.  Pending TTE. WBC count improving.  Lactic acid level normal. Clinically improving.  Continue to monitor. Patient has a port on right anterior chest wall which was placed in [place].  Because of bacteremia, port needs to be removed.  I have requested IR consultation. Recent Labs  Lab 08/03/22 1749 08/04/22 0257 08/04/22 0940 08/06/22 0020 08/07/22 0011 08/08/22 0007  WBC 23.9* 18.6*  --  14.5* 13.9* 13.4*  LATICACIDVEN  --   --  0.6  --   --   --     Acute hyperkalemia AKI on CKD 4 Anion gap metabolic acidosis Sent for significant rise in potassium and creatinine level and outpatient blood work. Labs in the ED found severe hyperkalemia at 6.7 and creatinine elevated to 6.88. Acute renal failure and hyperkalemia likely secondary to obstructive uropathy in the setting of solitary right kidney. Initially given membrane stabilizing agents for hyperkalemia. Lab trend as below.  Potassium level normalized.  Creatinine steadily improving, 5.13 today. Not on IV fluid.   Continue sodium bicarb 650 mg 3 times daily. Recent Labs  Lab 08/03/22 2107 08/04/22 0257 08/04/22 0837 08/04/22 1810 08/05/22 0018 08/06/22 0020 08/07/22 0011 08/08/22 0007  NA 133* 135   < > 134* 135 133* 136 135  K 5.0 5.0   < > 5.7* 5.6* 5.0 4.6 4.6  CL 102 101   < > 101 101 101 103 103  CO2 16* 19*   < > 20* 20* 19* 18* 20*  GLUCOSE 95 105*   < > 104* 202* 139* 128* 110*  BUN 84* 85*   < > 79* 81* 79* 76* 73*  CREATININE 6.74*  6.81*   < > 6.61* 6.57* 6.08* 5.47* 5.13*  CALCIUM 8.1* 7.9*   < > 7.6* 7.6* 7.2* 7.1* 7.4*  MG 1.7 1.7  --   --   --   --   --   --   PHOS  --  5.5*  --   --  6.3*  --   --   --    < > = values in this interval not displayed.    Obstructive uropathy Right hydronephrosis with stent in place Urology consult obtained.  2/24, underwent cystoscopy and  right ureteral stent placement.  Per operative report, there is no suspicious lesions, masses or stones.  Hypotension  H/o hypertension Blood pressure is running low normal. PTA on metoprolol tartrate 25 mg twice daily.  Currently continued on low-dose at 12.5 mg twice daily.  Acute on chronic anemia Baseline hemoglobin more than 10.  Lately running low.  Noted a drop in hemoglobin to 7.9 on 2/24.  No evidence of active bleeding.  Hemoglobin low at 7.9 today.  Transfuse if less than 7. Continue iron supplement. Continue to monitor. Recent Labs    08/03/22 1749 08/04/22 0257 08/05/22 1201 08/06/22 0020 08/07/22 0011 08/08/22 0007  HGB 9.2* 7.9*  --  7.3* 7.7* 7.9*  MCV 96.3 94.4  --  93.7 96.0 98.5  FERRITIN  --   --  883*  --   --   --   TIBC  --   --  190*  --   --   --   IRON  --   --  77  --   --   --     COPD Chronic respiratory failure with hypoxia Past history of smoking COPD on 2 L oxygen at home.  Remains on 2 L.. Continue bronchodilators as needed   GERD PPI  Hyperlipidemia Lipitor  Restless leg syndrome Continue Requip.     Mobility: PT eval obtained.  No PT follow-up recommended.  Goals of care   Code Status: Full Code     DVT prophylaxis:  SCDs Start: 08/03/22 1957   Antimicrobials: IV ampicillin Fluid: Per nephrology. Consultants: Nephrology, urology, ID Family Communication: None at bedside  Status is: Inpatient Level of care: Progressive   Dispo: Patient is from: Home              Anticipated d/c is to: Pending clinical course Continue in-hospital care because: AKI gradually improving, pending echo, pending port removal.   Scheduled Meds:  atorvastatin  40 mg Oral Daily   ferrous sulfate  325 mg Oral Q breakfast   fluticasone furoate-vilanterol  1 puff Inhalation Daily   metoprolol tartrate  12.5 mg Oral BID   pantoprazole  40 mg Oral Daily   rOPINIRole  2 mg Oral BID   sodium bicarbonate  650 mg Oral TID   umeclidinium bromide   1 puff Inhalation Daily    PRN meds: acetaminophen **OR** acetaminophen, levalbuterol, melatonin, ondansetron (ZOFRAN) IV   Infusions:   albumin human 25 g (08/08/22 0957)   ampicillin (OMNIPEN) IV Stopped (08/08/22 [id number])    Diet:  Diet Order             Diet regular Room service appropriate? Yes; Fluid consistency: Thin  Diet effective now                   Antimicrobials: Anti-infectives (From admission, onward)    Start     Dose/Rate Route Frequency Ordered Stop   08/08/22 1000  ampicillin (OMNIPEN) 2 g in sodium chloride 0.9 % 100 mL IVPB        2 g 300 mL/hr over 20 Minutes Intravenous Every 8 hours 08/08/22 0829     08/05/22 0600  ampicillin (OMNIPEN) 2 g in sodium chloride 0.9 % 100 mL IVPB  Status:  Discontinued        2 g 300 mL/hr over 20 Minutes Intravenous 2 times daily 08/05/22 0413 08/08/22 0829   08/04/22 0915  cefTRIAXone (ROCEPHIN) 1 g in sodium chloride 0.9 % 100 mL IVPB  Status:  Discontinued        1 g 200 mL/hr over 30 Minutes Intravenous Every 24 hours 08/04/22 0904 08/05/22 0413       Skin assessment:       Nutritional status:  Body mass index is 29.63 kg/m.  Nutrition Problem: Increased nutrient needs Etiology: acute illness (pyelonephritis, bacteremia) Signs/Symptoms: estimated needs     Objective: Vitals:   08/08/22 0309 08/08/22 0700  BP: 116/62 (!) 115/55  Pulse: 91 (!) 102  Resp: 18 20  Temp: 97.6 F (36.4 C) 97.8 F (36.6 C)  SpO2: 94% 97%    Intake/Output Summary (Last 24 hours) at 08/08/2022 1023 Last data filed at 08/08/2022 0930 Gross per 24 hour  Intake 960 ml  Output 1000 ml  Net -40 ml    Filed Weights   08/06/22 0454 08/07/22 0217 08/08/22 0528  Weight: 72.7 kg 74.9 kg 73.5 kg   Weight change: -1.4 kg Body mass index is 29.63 kg/m.   Physical Exam: General exam: Pleasant, elderly Caucasian female.  Not in pain Skin: No rashes, lesions or ulcers. HEENT: Atraumatic, normocephalic, no obvious  bleeding Lungs: Clear to auscultation bilaterally.  There is a Chemo-Port in right anterior chest wall. CVS: Regular rate and rhythm, no murmur GI/Abd soft nontender, nondistended, bowel sound present CNS: Alert, awake, oriented x 3 Psychiatry: Mood appropriate Extremities: No pedal edema, no calf tenderness  Data Review: I have personally reviewed the laboratory data and studies available.  F/u labs ordered Unresulted Labs (From admission, onward)     Start     Ordered   08/09/22 0500  Renal function panel  Tomorrow morning,   R       Question:  Specimen collection method  Answer:  Lab=Lab collect   08/08/22 0912            Total time spent in review of labs and imaging, patient evaluation, formulation of plan, documentation and communication with family: 34 minutes  Signed, Terrilee Croak, MD [facility] 08/08/2022

## 2022-08-08 NOTE — Consult Note (Signed)
Chief Complaint: Patient was seen in consultation today for port removal.  Referring Physician(s): Alexandra Record, MD  Supervising Physician: Alexandra [name]  Patient Status: [facility] - In-pt  History of Present Illness: Alexandra [name] is a 82 y.o. female with a past medical history significant for gout, COPD on 2L supplemental oxygen at baseline, CKD IV, HTN, HLD, renal cell s/p left nephrectomy 1999, bladder cancer currently undergoing treatment who presented to [facility] ED 08/03/22 with complaints of high potassium noted on outpatient labs (Alexandra [name]). She underwent CT renal stone study due to concern for obstructive uropathy which showed:  1. Interval development of right hydronephrosis despite presence of a ureteral stent. This may represent stent malfunction. Stranding along the right ureter may also indicate infection such as pyelonephritis. 2. Hyperdense right renal cyst is unchanged since prior study, likely hemorrhagic cyst. No imaging follow-up is indicated. 3. Asymmetric wall thickening of the right side of the bladder is unchanged since prior study. Etiology is indeterminate. 4. Aortic atherosclerosis  She was found to have e.faecalis bacteremia and was admitted with SIRS. She underwent right stent placement with urology 08/04/22 and has been placed on antibiotics for pyelonephritis. IR has been asked to remove the port by ID due to bacteremia.  Alexandra [name] undergoing bedside echo during my exam. She denies complaints currently, she is unsure the last time her port was used prior to this hospitalization. She understands the request for port removal and is agreeable to proceed with local sedation only.   Past Medical History:  Diagnosis Date   CKD (chronic kidney disease), stage IV (HCC)    COPD (chronic obstructive pulmonary disease) (HCC)    GERD (gastroesophageal reflux disease)    Hyperlipidemia    Hyperparathyroidism, primary ([place]) 06/25/2016   Hypertension     Osteoporosis    Restless legs syndrome    RLS (restless legs syndrome)     Past Surgical History:  Procedure Laterality Date   ABDOMINAL HYSTERECTOMY     complete: menorrhagia. 82 yo   BACK SURGERY  2016   lower L 4 to L 5   bladder tack and uterus lift  10/10/2015   CHOLECYSTECTOMY     summer of 2022   CYSTOSCOPY WITH RETROGRADE PYELOGRAM, URETEROSCOPY AND STENT PLACEMENT Right 08/04/2022   Procedure: CYSTOSCOPY WITH RIGHT URETERAL STENT EXCHANGE;  Surgeon: Alexandra Agar, MD;  Location: [facility];  Service: Urology;  Laterality: Right;   IR RADIOLOGIST EVAL & MGMT  12/03/2018   IR RADIOLOGIST EVAL & MGMT  12/31/2018   PARATHYROIDECTOMY Left 07/02/2016   Procedure: LEFT INFERIOR PARATHYROIDECTOMY;  Surgeon: Alexandra Gemma, MD;  Location: [facility];  Service: General;  Laterality: Left;   SHOULDER OPEN ROTATOR CUFF REPAIR  12/2019   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    Allergies: Contrast media [iodinated contrast media], Omeprazole, Shellfish allergy, and Shellfish-derived products  Medications: Prior to Admission medications   Medication Sig Start Date End Date Taking? Authorizing Provider  atorvastatin (LIPITOR) 40 MG tablet Take 1 tablet (40 mg total) by mouth daily. Per ccm 10/24/21  Yes Heaton, Thornell Mule, NP  Budeson-Glycopyrrol-Formoterol (BREZTRI AEROSPHERE) 160-9-4.8 MCG/ACT AERO Inhale 2 puffs into the lungs in the morning and at bedtime. 07/31/22  Yes Cox, Kirsten, MD  budesonide (PULMICORT) 0.5 MG/2ML nebulizer solution Take 0.5 mg by nebulization 2 (two) times daily.   Yes [provider]  Cholecalciferol (VITAMIN D) 125 MCG (5000 UT) CAPS Take 1 capsule by mouth daily.  Yes [provider]  ipratropium (ATROVENT) 0.02 % nebulizer solution Take 0.5 mg by nebulization 2 (two) times daily. 06/22/22  Yes [provider]  levalbuterol (XOPENEX) 1.25 MG/3ML nebulizer solution Take 1.25 mg by nebulization 2 (two) times daily. 06/22/22  Yes [provider]  metoprolol succinate (TOPROL-XL) 25 MG 24 hr tablet Take 25 mg by mouth daily. 07/28/22  Yes [provider]  nitroGLYCERIN (NITROSTAT) 0.4 MG SL tablet Place 1 tablet (0.4 mg total) under the tongue every 5 (five) minutes as needed for chest pain. 07/13/22  Yes Aryal, Palma Holter, FNP  ondansetron (ZOFRAN-ODT) 4 MG disintegrating tablet Take 1 tablet (4 mg total) by mouth every 8 (eight) hours as needed for nausea or vomiting. 07/31/22  Yes Cox, Kirsten, MD  polyethylene glycol powder (GLYCOLAX/MIRALAX) 17 GM/SCOOP powder Take 17 g by mouth daily as needed for mild constipation. 06/29/22  Yes [provider]  rOPINIRole (REQUIP) 2 MG tablet TAKE 1 TABLET(2 MG) BY MOUTH IN THE MORNING AND AT BEDTIME Patient taking differently: Take 2 mg by mouth 2 (two) times daily. 11/28/21  Yes Alexandra Harbour, NP  torsemide (DEMADEX) 20 MG tablet Take 20 mg by mouth daily as needed (edema).   Yes [provider]  VENTOLIN HFA 108 (90 Base) MCG/ACT inhaler Inhale 1-2 puffs into the lungs every 6 (six) hours as needed for wheezing or shortness of breath. 07/31/22  Yes Cox, Elnita [place], MD     Family History  Problem Relation Age of Onset   Emphysema Mother    Hyperlipidemia Sister    Hypertension Sister    COPD Sister    Hyperlipidemia Sister    Hypertension Sister    Lymphoma Sister    Obesity Brother    Hyperlipidemia Brother    Hypertension Brother    Breast cancer Neg Hx     Social History   Socioeconomic History   Marital status: Married    Spouse name: Alexandra [name]   Number of children: 2   Years of education: 11   Highest education level: 11th grade  Occupational History   Occupation: Retired  Tobacco Use   Smoking status: Former    Packs/day: 1.00    Years: 50.00    Total pack years: 50.00    Types: Cigarettes    Quit date: 06/11/2005    Years since quitting: 17.1   Smokeless tobacco: Never  Vaping Use   Vaping Use: Never used  Substance and Sexual Activity    Alcohol use: No   Drug use: No   Sexual activity: Yes    Partners: Male  Other Topics Concern   Not on file  Social History Narrative   Not on file   Social Determinants of Health   Financial Resource Strain: Medium Risk (07/25/2022)   Overall Financial Resource Strain (CARDIA)    Difficulty of Paying Living Expenses: Somewhat hard  Food Insecurity: No Food Insecurity (08/04/2022)   Hunger Vital Sign    Worried About Running Out of Food in the Last Year: Never true    [place] in the Last Year: Never true  Transportation Needs: No Transportation Needs (08/04/2022)   PRAPARE - Hydrologist (Medical): No    Lack of Transportation (Non-Medical): No  Physical Activity: Inactive (07/25/2022)   Exercise Vital Sign    Days of Exercise per Week: 0 days    Minutes of Exercise per Session: 0 min  Stress: Stress Concern Present (07/25/2022)  Wilkin Questionnaire    Feeling of Stress : To some extent  Social Connections: Moderately Integrated (02/15/2022)   Social Connection and Isolation Panel [NHANES]    Frequency of Communication with Friends and Family: More than three times a week    Frequency of Social Gatherings with Friends and Family: More than three times a week    Attends Religious Services: More than 4 times per year    Active Member of [employer] or Organizations: No    Attends Archivist Meetings: Never    Marital Status: Married     Review of Systems: A 12 point ROS discussed and pertinent positives are indicated in the HPI above.  All other systems are negative.  Review of Systems  Constitutional:  Negative for chills and fever.  Respiratory:  Negative for cough and shortness of breath.   Cardiovascular:  Negative for chest pain.  Gastrointestinal:  Negative for abdominal pain.  Neurological:  Negative for dizziness and headaches.    Vital Signs: BP (!) 115/55 (BP  Location: Right Arm)   Pulse (!) 102   Temp 97.8 F (36.6 C) (Oral)   Resp 20   Ht 5' 2.01" (1.575 m)   Wt 162 lb 0.6 oz (73.5 kg)   SpO2 97%   BMI 29.63 kg/m   Physical Exam Vitals and nursing note reviewed.  Constitutional:      General: She is not in acute distress. HENT:     Head: Normocephalic.  Cardiovascular:     Rate and Rhythm: Tachycardia present.     Comments: (+) right chest port without purulence, erythema, edema, tenderness. Pulmonary:     Effort: Pulmonary effort is normal.     Comments: (+) 2L O2 via [place] Abdominal:     Palpations: Abdomen is soft.  Skin:    General: Skin is warm and dry.  Neurological:     Mental Status: She is alert and oriented to person, place, and time.  Psychiatric:        Mood and Affect: Mood normal.        Behavior: Behavior normal.        Thought Content: Thought content normal.        Judgment: Judgment normal.       Imaging: DG Retrograde Pyelogram  Result Date: 08/04/2022 CLINICAL DATA:  82 year old female with history of right double-J nephroureteral stent. EXAM: RETROGRADE PYELOGRAM COMPARISON:  CT abdomen pelvis from 08/03/2022 FINDINGS: Single fluoroscopic image demonstrates superior aspect of the indwelling right double-J nephroureteral stent which appears well positioned. IMPRESSION: Single fluoroscopic image demonstrates superior aspect of the indwelling right double-J nephroureteral stent which appears well positioned. Ruthann Cancer, MD [facility] Radiology Electronically Signed   By: Ruthann Cancer M.D.   On: 08/04/2022 16:47   CT Renal Stone Study  Result Date: 08/03/2022 CLINICAL DATA:  Rule out obstructive uropathy. Routine blood work demonstrated high potassium levels. EXAM: CT ABDOMEN AND PELVIS WITHOUT CONTRAST TECHNIQUE: Multidetector CT imaging of the abdomen and pelvis was performed following the standard protocol without IV contrast. RADIATION DOSE REDUCTION:  This exam was performed according to the departmental dose-optimization program which includes automated exposure control, adjustment of the mA and/or kV according to patient size and/or use of iterative reconstruction technique. COMPARISON:  07/20/2022 FINDINGS: Lower chest: Trace bilateral pleural effusions with atelectasis in the lung bases. Emphysematous changes in the lungs. Hepatobiliary: Surgical absence of the gallbladder. No focal liver lesions.  Scattered calcification in the left lobe of the liver, likely granulomas. Pancreas: Unremarkable. No pancreatic ductal dilatation or surrounding inflammatory changes. Spleen: Normal in size without focal abnormality. Adrenals/Urinary Tract: No adrenal gland nodules. Surgical absence of the left kidney. Right kidney is rotated. There is a right ureteral stent with proximal pigtail in the renal pelvis and distal pigtail in the bladder. Moderate right renal hydronephrosis, progressing since prior study. This may indicate stent malfunction. Several cysts in the right kidney. Largest is hyperdense, measuring 3.6 cm diameter. This is unchanged since the prior study and likely represents a hemorrhagic cyst. Stranding around the right ureter may indicate changes due to pyelonephritis or other inflammatory process. Bladder is decompressed, limiting evaluation but there is asymmetric wall thickening of the right bladder wall, unchanged since prior study. Stomach/Bowel: Stomach, small bowel, and colon are not abnormally distended. Residual densities scattered throughout the small bowel and colon are new since prior study, likely representing ingested contrast material or other radiodense material. No evidence of bowel wall thickening or inflammatory stranding. Appendix is not identified. Colonic diverticula without evidence of acute diverticulitis. Vascular/Lymphatic: Calcification of the aorta. No aneurysm. No significant lymphadenopathy. Reproductive: Status post  hysterectomy. No adnexal masses. Other: No free air or free fluid in the abdomen. Presacral soft tissue stranding is similar to prior study and may be inflammatory. Mild edema in the subcutaneous fat. Musculoskeletal: Degenerative changes in the spine. Postoperative fixation of the lower spine. IMPRESSION: 1. Interval development of right hydronephrosis despite presence of a ureteral stent. This may represent stent malfunction. Stranding along the right ureter may also indicate infection such as pyelonephritis. 2. Hyperdense right renal cyst is unchanged since prior study, likely hemorrhagic cyst. No imaging follow-up is indicated. 3. Asymmetric wall thickening of the right side of the bladder is unchanged since prior study. Etiology is indeterminate. 4. Aortic atherosclerosis. Electronically Signed   By: Lucienne Capers M.D.   On: 08/03/2022 21:49   US Renal  Result Date: 08/03/2022 CLINICAL DATA:  History of prior left nephrectomy, presenting with acute kidney injury. EXAM: RENAL / URINARY TRACT ULTRASOUND COMPLETE COMPARISON:  July 23, 2022 FINDINGS: Right Kidney: Renal measurements: 11.2 cm x 6.6 cm x 6.4 cm = volume: 243.2 mL. Diffusely increased echogenicity of the renal parenchyma is noted. A 3.2 cm x 3.5 cm x 2.9 cm complex renal cyst is seen within the upper pole of the right kidney. An adjacent 1.6 cm x 1.5 cm x 1.6 cm simple right renal cyst is noted. These areas are seen on the prior study. There is moderate severity right-sided hydronephrosis which represents a new finding. The proximal portion of a right-sided endo ureteral stent is seen within the proximal right ureter. Left Kidney: The left kidney is surgically absent. Bladder: Poorly distended and subsequently limited in evaluation. The distal portion of a right-sided endo ureteral stent is visualized. Other: None. IMPRESSION: 1. Right-sided endo ureteral stent, as described above, with moderate severity right-sided hydronephrosis and  proximal hydroureter. 2. Diffusely increased renal echogenicity which likely represents sequelae associated with acute kidney injury. 3. Complex and simple right renal cysts, as described above. 4. Findings consistent with prior left nephrectomy. Electronically Signed   By: Virgina Norfolk M.D.   On: 08/03/2022 21:34   DG Chest Port 1 View  Result Date: 08/03/2022 CLINICAL DATA:  Leukocytosis EXAM: PORTABLE CHEST 1 VIEW COMPARISON:  07/22/2022 FINDINGS: Stable cardiomediastinal silhouette. Aortic atherosclerotic calcification. Right chest wall Port-A-Cath tip in the low SVC. Bibasilar atelectasis/consolidation. Question small  bilateral pleural effusions. No pneumothorax. IMPRESSION: Bibasilar atelectasis/consolidation. Pneumonia is difficult to exclude. Question small bilateral pleural effusions. Electronically Signed   By: Placido Sou M.D.   On: 08/03/2022 21:16    Labs:  CBC: Recent Labs    08/04/22 0257 08/06/22 0020 08/07/22 0011 08/08/22 0007  WBC 18.6* 14.5* 13.9* 13.4*  HGB 7.9* 7.3* 7.7* 7.9*  HCT 23.7* 22.2* 23.7* 25.7*  PLT 235 241 268 248    COAGS: Recent Labs    08/04/22 0257  INR 1.1    BMP: Recent Labs    08/05/22 0018 08/06/22 0020 08/07/22 0011 08/08/22 0007  NA 135 133* 136 135  K 5.6* 5.0 4.6 4.6  CL 101 101 103 103  CO2 20* 19* 18* 20*  GLUCOSE 202* 139* 128* 110*  BUN 81* 79* 76* 73*  CALCIUM 7.6* 7.2* 7.1* 7.4*  CREATININE 6.57* 6.08* 5.47* 5.13*  GFRNONAA 6* 7* 7* 8*    LIVER FUNCTION TESTS: Recent Labs    07/03/22 1630 07/31/22 1415 08/03/22 1030 08/04/22 0257 08/05/22 0018 08/08/22 0007  BILITOT 0.2 0.3 0.4 0.6  --   --   AST '22 18 18 18  '$ --   --   ALT 32 '19 15 16  '$ --   --   ALKPHOS 74 99 95 65  --   --   PROT 5.8* 6.3 5.4* 5.2*  --   --   ALBUMIN 3.5* 3.5* 3.3* 2.1* 2.0* 1.8*    TUMOR MARKERS: No results for input(s): "AFPTM", "CEA", "CA199", "CHROMGRNA" in the last 8760 hours.  Assessment and Plan:  82 y/o F with  history of bladder cancer currently undergoing systemic therapy via right chest port admitted with right hydronephrosis, pyelonephritis and e.faecalis bacteremia. She underwent right stent placement 2/24 with urology and is currently receiving IV antibiotics for pyelonephritis and bacteremia. IR has been consulted for port removal for source control.  Patient currently has a right chest port that was placed at OSH approximately 4 months ago per patient. She denies any issues or signs of infection. She is agreeable to have it removed with local sedation only today.  Risks and benefits of image-guided Port-a-catheter removal were discussed with the patient including, but not limited to bleeding, infection, pneumothorax  and need for additional procedures.  All of the patient's questions were answered, patient is agreeable to proceed.  Consent signed and in chart.  Thank you for this interesting consult.  I greatly enjoyed meeting Alexandra [name] and look forward to participating in their care.  A copy of this report was sent to the requesting provider on this date.  Electronically Signed: Joaquim Nam, PA-C 08/08/2022, 9:29 AM   I spent a total of 40 Minutes in face to face in clinical consultation, greater than 50% of which was counseling/coordinating care for port removal.

## 2022-08-08 NOTE — Progress Notes (Signed)
PHARMACY NOTE:  ANTIMICROBIAL RENAL DOSAGE ADJUSTMENT  Current antimicrobial regimen includes a mismatch between antimicrobial dosage and estimated renal function.  As per policy approved by the Pharmacy & Therapeutics and Medical Executive Committees, the antimicrobial dosage will be adjusted accordingly.  Current antimicrobial dosage:  Ampicillin 2g IV every 12 hours  Indication: Enterococcus faecalis bacteremia  Renal Function:  Estimated Creatinine Clearance: 8.4 mL/min (A) (by C-G formula based on SCr of 5.13 mg/dL (H)). '[]'$      On intermittent HD, scheduled: '[]'$      On CRRT    Antimicrobial dosage has been changed to:  Ampicillin 2g IV every 8 hours  Additional comments: Recovering AKI with SCr downtrending and UOP up to 0.9 ml/kg/hr, estimated CrCl>15 ml/min but not reflective of current calculations. Will increase the dose to match presumed renal function with AKI recovery.  Thank you for allowing pharmacy to be a part of this patient's care.  Alycia Rossetti, PharmD, BCPS Infectious Diseases Clinical Pharmacist 08/08/2022 11:22 AM   **Pharmacist phone directory can now be found on [email or web address] ([email or web address]).  Listed under [place].

## 2022-08-08 NOTE — Procedures (Signed)
Pre Procedure Dx: Bacteremia. Post Procedural Dx: Same  Successful removal of right chest wall port-a-cath.  Estimated Blood Loss: Minimal Complications: None immediate.  Ronny Bacon, MD Pager #: [phone]

## 2022-08-09 DIAGNOSIS — I5030 Unspecified diastolic (congestive) heart failure: Secondary | ICD-10-CM | POA: Diagnosis not present

## 2022-08-09 DIAGNOSIS — J449 Chronic obstructive pulmonary disease, unspecified: Secondary | ICD-10-CM | POA: Diagnosis not present

## 2022-08-09 DIAGNOSIS — N184 Chronic kidney disease, stage 4 (severe): Secondary | ICD-10-CM | POA: Diagnosis not present

## 2022-08-09 DIAGNOSIS — I110 Hypertensive heart disease with heart failure: Secondary | ICD-10-CM

## 2022-08-09 DIAGNOSIS — N179 Acute kidney failure, unspecified: Secondary | ICD-10-CM | POA: Diagnosis not present

## 2022-08-09 LAB — RENAL FUNCTION PANEL
Albumin: 2.4 g/dL — ABNORMAL LOW (ref 3.5–5.0)
Anion gap: 13 (ref 5–15)
BUN: 67 mg/dL — ABNORMAL HIGH (ref 8–23)
CO2: 18 mmol/L — ABNORMAL LOW (ref 22–32)
Calcium: 7.7 mg/dL — ABNORMAL LOW (ref 8.9–10.3)
Chloride: 107 mmol/L (ref 98–111)
Creatinine, Ser: 4.81 mg/dL — ABNORMAL HIGH (ref 0.44–1.00)
GFR, Estimated: 9 mL/min — ABNORMAL LOW (ref >60)
Glucose, Bld: 125 mg/dL — ABNORMAL HIGH (ref 70–99)
Phosphorus: 3.9 mg/dL (ref 2.5–4.6)
Potassium: 4.4 mmol/L (ref 3.5–5.1)
Sodium: 138 mmol/L (ref 135–145)

## 2022-08-09 MED ORDER — SODIUM CHLORIDE 0.9 % IV SOLN: INTRAVENOUS | Status: DC

## 2022-08-09 NOTE — Progress Notes (Signed)
[facility] NEPHROLOGY PROGRESS NOTE  Assessment/ Plan: Pt is a 82 y.o. yo female with hypertension, renal cell carcinoma status post left total nephrectomy in 1999, COPD, CKD stage IV admitted with decreased urine output and abnormal lab results including hyperkalemia and AKI.  # Acute kidney injury secondary to obstructive uropathy in the solitary kidney, also with ischemic and prerenal insults with marginal blood pressure and bacteremia.  Status post ureteral stent exchange by urology. Urine output has increased associated with continuing improvement in BUN and creatinine level.  No sign or symptoms of uremia.  No need for dialysis.  Expect continued renal recovery.  Received IV albumin on 2/28.  Continue daily lab and strict ins and out.   # Obstructive uropathy status post right ureteral stent exchange by urologist on 2/24.  Management per urology.   # E faecalis bacteremia likely due to pyelonephritis: On ampicillin, TTE and ID recommendation.      # Hyperkalemia: Improved with Lokelma, bicarbonate.  Monitor lab.   # Metabolic acidosis due to AKI, continue oral sodium bicarbonate.   # Solitary kidney  - note hx of remote left total nephrectomy for renal cell   # CKD stage IV - Baseline Cr near 3 and follows with [facility] - Dr. Hollie [name]   # Hypertension: Blood pressure variable with some episode of hypotension.  Currently only on low-dose metoprolol.  # Anemia:  Iron stores acceptable.  Received Aranesp on 2/26.  Monitor hemoglobin and transfuse as needed.  Subjective: [name].  Urine output recorded 1.3 L.  Denies nausea, vomiting, chest pain, shortness of breath.  No major health event overnight.  Objective Vital signs in last 24 hours: Vitals:   08/09/22 0515 08/09/22 0711 08/09/22 0755 08/09/22 0853  BP:  (!) 133/57    Pulse:  94 100 100  Resp:  (!) '29 20 20  '$ Temp:  98.2 F (36.8 C)    TempSrc:  Oral    SpO2:  93% 98% 97%   Weight: 73.9 kg     Height:       Weight change: 0.4 kg  Intake/Output Summary (Last 24 hours) at 08/09/2022 0906 Last data filed at 08/09/2022 0830 Gross per 24 hour  Intake 969.84 ml  Output 1650 ml  Net -680.16 ml        Labs: RENAL PANEL Recent Labs    05/30/22 0825 06/06/22 0917 06/13/22 1021 07/03/22 1630 07/31/22 1415 08/03/22 1030 08/03/22 1749 08/03/22 2107 08/04/22 0257 08/04/22 0837 08/04/22 1810 08/05/22 0018 08/06/22 0020 08/07/22 0011 08/08/22 0007 08/09/22 0009  NA 136 139 132* 146* 136 132* 130* 133* 135 132* 134* 135 133* 136 135 138  K 5.4* 4.9 5.1 5.6* 6.3* 6.7* 6.4* 5.0 5.0 5.2* 5.7* 5.6* 5.0 4.6 4.6 4.4  CL 109 112* '107 104 99 100 99 102 101 98 101 101 '$ 101 103 103 107  CO2 20* 19* 18* 20 18* 17* 15* 16* 19* 19* 20* 20* 19* 18* 20* 18*  GLUCOSE 95 104* 108* 115* 100* 96 101* 95 105* 104* 104* 202* 139* 128* 110* 125*  BUN 57* 55* 70* 110* 56* 84* 86* 84* 85* 81* 79* 81* 79* 76* 73* 67*  CREATININE 2.58* 2.97* 3.05* 3.58* 3.68* 6.84* 6.88* 6.74* 6.81* 6.87* 6.61* 6.57* 6.08* 5.47* 5.13* 4.81*  CALCIUM 8.6* 8.3* 8.2* 8.9 8.8 8.1* 7.9* 8.1* 7.9* 7.7* 7.6* 7.6* 7.2* 7.1* 7.4* 7.7*  MG  --   --   --   --   --   --   --  1.7 1.7  --   --   --   --   --   --   --   PHOS  --   --   --   --   --   --   --   --  5.5*  --   --  6.3*  --   --   --  3.9  ALBUMIN 3.2* 3.0* 3.1* 3.5* 3.5* 3.3*  --   --  2.1*  --   --  2.0*  --   --  1.8* 2.4*      Liver Function Tests: Recent Labs  Lab 08/03/22 1030 08/04/22 0257 08/04/22 0257 08/05/22 0018 08/08/22 0007 08/09/22 0009  AST 18 18  --   --   --   --   ALT 15 16  --   --   --   --   ALKPHOS 95 65  --   --   --   --   BILITOT 0.4 0.6  --   --   --   --   PROT 5.4* 5.2*  --   --   --   --   ALBUMIN 3.3* 2.1*   < > 2.0* 1.8* 2.4*   < > = values in this interval not displayed.    No results for input(s): "LIPASE", "AMYLASE" in the last 168 hours. No results for input(s): "AMMONIA" in the last 168  hours. CBC: Recent Labs    08/03/22 1749 08/04/22 0257 08/05/22 1201 08/06/22 0020 08/07/22 0011 08/08/22 0007  HGB 9.2* 7.9*  --  7.3* 7.7* 7.9*  MCV 96.3 94.4  --  93.7 96.0 98.5  FERRITIN  --   --  883*  --   --   --   TIBC  --   --  190*  --   --   --   IRON  --   --  77  --   --   --      Cardiac Enzymes: Recent Labs  Lab 08/03/22 2107  CKTOTAL 43    CBG: No results for input(s): "GLUCAP" in the last 168 hours.  Iron Studies:  No results for input(s): "IRON", "TIBC", "TRANSFERRIN", "FERRITIN" in the last 72 hours.  Studies/Results: IR REMOVAL TUN ACCESS W/ PORT W/O FL MOD SED  Result Date: 08/08/2022 CLINICAL DATA:  Bacteremia (admitted with pyelonephritis). Request made for removal of Port a Catheter The port a Catheter was surgically placed on 05/07/2022 at [facility]. EXAM: REMOVAL OF IMPLANTED TUNNELED PORT-A-CATH MEDICATIONS: None, the patient is currently admitted to the hospital receiving intravenous antibiotics. The antibiotic was administered within 1 hour prior to the start of the procedure. ANESTHESIA/SEDATION: None FLUOROSCOPY: None PROCEDURE: Informed written consent was obtained from the patient after a discussion of the risk, benefits and alternatives to the procedure. The patient was positioned supine on the fluoroscopy table and the right chest Port-A-Cath site was prepped with chlorhexidine. A sterile gown and gloves were worn during the procedure. Local anesthesia was provided with 1% lidocaine with epinephrine. A timeout was performed prior to the initiation of the procedure. An incision was made overlying the Port-A-Cath with a #15 scalpel. Utilizing sharp and blunt dissection, the Port-A-Cath was removed completely. As there was no obvious signs of port a catheter reservoir infection the decision was made to close the port a catheter pocket the primary intention. The pocked was irrigated with sterile saline. Wound closure was performed with  interrupted subcutaneous 2-0 Vicryl  sutures, Dermabond and Steri-Strips. Dressings were applied. The patient tolerated the procedure well without immediate post procedural complication. FINDINGS: Successful removal of implant Port-A-Cath without immediate post procedural complication. IMPRESSION: Successful removal of implanted Port-A-Cath. Electronically Signed   By: Sandi Mariscal M.D.   On: 08/08/2022 12:15   ECHOCARDIOGRAM COMPLETE BUBBLE STUDY  Result Date: 08/08/2022    ECHOCARDIOGRAM REPORT   Patient Name:   Alexandra [name] Date of Exam: 08/08/2022 Medical Rec #:  [id number]       Height:       62.0 in Accession #:    [id number]      Weight:       162.0 lb Date of Birth:  Feb 23, 1941       BSA:          1.748 m Patient Age:    10 years        BP:           115/55 mmHg Patient Gender: F               HR:           89 bpm. Exam Location:  Inpatient Procedure: 2D Echo, Cardiac Doppler and Color Doppler Indications:     Endocarditis/pericardial effusion  History:         Patient has prior history of Echocardiogram examinations, most                  recent 06/20/2022. COPD; Risk Factors:Hypertension and                  Dyslipidemia. CKD, hx of RCC s/p total nephrectomy.  Sonographer:     Eartha Inch Referring Phys:  [id number] [facility] Diagnosing Phys: Glori Bickers MD IMPRESSIONS  1. Left ventricular ejection fraction, by estimation, is 60 to 65%. The left ventricle has normal function. The left ventricle has no regional wall motion abnormalities. Left ventricular diastolic parameters were normal.  2. Right ventricular systolic function is normal. The right ventricular size is normal.  3. The mitral valve is normal in structure. Trivial mitral valve regurgitation. No evidence of mitral stenosis.  4. The aortic valve is tricuspid. There is mild calcification of the aortic valve. Aortic valve regurgitation is not visualized. Aortic valve sclerosis/calcification is present, without any evidence of aortic  stenosis.  5. The inferior vena cava is normal in size with greater than 50% respiratory variability, suggesting right atrial pressure of 3 mmHg.  6. I was asked to re-review the echo regarding the aortic valve. There is a small focal calcified nodule on the right coronary cusp of the aortic valve. I cannot exclude endocarditis. If clinic picture suspicious for endocarditis would strongly recommend TEE, FINDINGS  Left Ventricle: Left ventricular ejection fraction, by estimation, is 60 to 65%. The left ventricle has normal function. The left ventricle has no regional wall motion abnormalities. The left ventricular internal cavity size was normal in size. There is  no left ventricular hypertrophy. Left ventricular diastolic parameters were normal. Right Ventricle: The right ventricular size is normal. No increase in right ventricular wall thickness. Right ventricular systolic function is normal. Left Atrium: Left atrial size was normal in size. Right Atrium: Right atrial size was normal in size. Pericardium: There is no evidence of pericardial effusion. Mitral Valve: The mitral valve is normal in structure. Trivial mitral valve regurgitation. No evidence of mitral valve stenosis. MV peak gradient, 8.1 mmHg. The mean mitral valve gradient is 4.0 mmHg. Tricuspid Valve: The  tricuspid valve is normal in structure. Tricuspid valve regurgitation is trivial. No evidence of tricuspid stenosis. Aortic Valve: The aortic valve is tricuspid. There is mild calcification of the aortic valve. Aortic valve regurgitation is not visualized. Aortic valve sclerosis/calcification is present, without any evidence of aortic stenosis. Pulmonic Valve: The pulmonic valve was normal in structure. Pulmonic valve regurgitation is trivial. No evidence of pulmonic stenosis. Aorta: The aortic root is normal in size and structure. Venous: The inferior vena cava is normal in size with greater than 50% respiratory variability, suggesting right atrial  pressure of 3 mmHg. IAS/Shunts: No atrial level shunt detected by color flow Doppler.  LEFT VENTRICLE PLAX 2D LVIDd:         3.70 cm     Diastology LVIDs:         2.70 cm     LV e' medial:    7.94 cm/s LV PW:         0.90 cm     LV E/e' medial:  14.0 LV IVS:        0.80 cm     LV e' lateral:   8.49 cm/s LVOT diam:     1.90 cm     LV E/e' lateral: 13.1 LV SV:         69 LV SV Index:   39 LVOT Area:     2.84 cm  LV Volumes (MOD) LV vol d, MOD A2C: 78.9 ml LV vol d, MOD A4C: 90.4 ml LV vol s, MOD A2C: 34.8 ml LV vol s, MOD A4C: 35.0 ml LV SV MOD A2C:     44.1 ml LV SV MOD A4C:     90.4 ml LV SV MOD BP:      48.5 ml RIGHT VENTRICLE            IVC RV S prime:     7.51 cm/s  IVC diam: 1.90 cm TAPSE (M-mode): 1.4 cm LEFT ATRIUM             Index        RIGHT ATRIUM           Index LA diam:        2.80 cm 1.60 cm/m   RA Area:     12.60 cm LA Vol (A2C):   56.6 ml 32.38 ml/m  RA Volume:   26.90 ml  15.39 ml/m LA Vol (A4C):   44.7 ml 25.57 ml/m LA Biplane Vol: 50.7 ml 29.00 ml/m  AORTIC VALVE LVOT Vmax:   117.00 cm/s LVOT Vmean:  76.000 cm/s LVOT VTI:    0.243 m  AORTA Ao Root diam: 2.80 cm Ao Asc diam:  3.10 cm MITRAL VALVE                TRICUSPID VALVE MV Area (PHT): 3.27 cm     TR Peak grad:   19.5 mmHg MV Area VTI:   1.88 cm     TR Vmax:        221.00 cm/s MV Peak grad:  8.1 mmHg MV Mean grad:  4.0 mmHg     SHUNTS MV Vmax:       1.42 m/s     Systemic VTI:  0.24 m MV Vmean:      99.3 cm/s    Systemic Diam: 1.90 cm MV Decel Time: 232 msec MR Peak grad: 16.0 mmHg MR Vmax:      200.00 cm/s MV E velocity: 111.50 cm/s MV A velocity: 126.00 cm/s MV E/A  ratio:  0.88 Glori Bickers MD Electronically signed by Glori Bickers MD Signature Date/Time: 08/08/2022/10:31:18 AM    Final (Updated)     Medications: Infusions:  ampicillin (OMNIPEN) IV Stopped (08/09/22 0157)    Scheduled Medications:  atorvastatin  40 mg Oral Daily   ferrous sulfate  325 mg Oral Q breakfast   fluticasone furoate-vilanterol  1 puff  Inhalation Daily   metoprolol tartrate  12.5 mg Oral BID   pantoprazole  40 mg Oral Daily   rOPINIRole  2 mg Oral BID   sodium bicarbonate  650 mg Oral TID   umeclidinium bromide  1 puff Inhalation Daily    have reviewed scheduled and prn medications.  Physical Exam: General:NAD, comfortable Heart:RRR, s1s2 nl Lungs:clear b/l, no crackle Abdomen:soft, Non-tender, non-distended Extremities: Trace peripheral lower extremities edema Neurology: Alert, awake and following commands.  Nicholes Hibler Tanna Furry 08/09/2022,9:06 AM  LOS: 6 days

## 2022-08-09 NOTE — Progress Notes (Signed)
   [place] has been requested to perform a transesophageal echocardiogram on Alexandra [name] for evaluation of a aortic valve nodule. Echocardiogram on 2/28 showed EF 60-65%, no regional wall motion abnormalities, normal RV systolic function. There was a small focal calcified nodule on the right coronary cusp of the aortic valve. Could not exclude endocarditis. Recommended TEE for further evaluation.   After careful review of history and examination, the risks and benefits of transesophageal echocardiogram have been explained including risks of esophageal damage, perforation (1:10,000 risk), bleeding, pharyngeal hematoma as well as other potential complications associated with conscious sedation including aspiration, arrhythmia, respiratory failure and death. Alternatives to treatment were discussed, questions were answered. Patient is willing to proceed.   Margie Billet, PA-C 08/09/2022 3:39 PM

## 2022-08-09 NOTE — Plan of Care (Signed)

## 2022-08-09 NOTE — Progress Notes (Signed)
PROGRESS NOTE  Alexandra [name]  DOB: 15-Oct-1940  PCP: Rochel Brome, MD [id number]  DOA: 08/03/2022  LOS: 6 days  Hospital Day: 7  Brief narrative: Alexandra [name] is a 82 y.o. female with PMH significant for HTN, HLD, CKD 4, COPD on 2 L oxygen at home., GERD, restless legs, per parathyroidism, RCC s/p left total nephrectomy 1999, anemia chronic disease. 2/23, patient was sent to the ED after noted to have elevated creatinine and potassium level in outpatient lab done on 2/20.  Follows up with nephrologist Dr. Hollie Salk.  No recent symptoms.  In the ED, patient was afebrile, hemodynamically stable with blood pressure in 100s Initial labs with sodium 132, potassium 6.7, BUN/creatinine 80/6.84, WC count elevated to 24,000, hemoglobin low at 9.2 EDP discussed with nephrologist Dr. Royce Macadamia.  Patient received temporizing measures for hyperkalemia with sodium bicarb, insulin, calcium gluconate, Lokelma.  Urinalysis showed hazy yellow urine, moderate hemoglobin, large leukocytes, rare bacteria. Ultrasound renal was obtained which showed a right sided ureteral stent with moderate severity right-sided hydronephrosis and proximal hydroureter. CT renal study confirmed the finding suggesting stent malfunction.  Also suggested possible right pyelonephritis. Urology consultation was obtained.  Admitted to [facility] Blood cultures sent on admission is growing Enterococcus faecalis in both bottles. Pending TEE 3/1.  Subjective: Patient was seen and examined this morning. Sitting up in recliner.  Not in distress.  On low-flow supplemental oxygen. Renal function. Pending TEE tomorrow 3/1.  Assessment and plan: Sepsis secondary to acute right pyelonephritis POA Enterococcus faecalis bacteremia Urinalysis showed large leukocytes WBC count significantly elevated to 24,000 CT scan suggestive of acute pyonephritis Urine culture grew > 100,000 CFU per mL of Enterococcus faecalis.  Blood culture Enterococcus  faecalis as well.  She was initially started on IV Rocephin, later switched to IV ampicillin per ID recommendation.   Right anterior chest wall port removed on 2/28. WBC count improving.  Lactic acid level normal. 2/28, TTE showed possible vegetation.  Pending TEE for tomorrow 3/1. Clinically improving.  Continue to monitor. Recent Labs  Lab 08/03/22 1749 08/04/22 0257 08/04/22 0940 08/06/22 0020 08/07/22 0011 08/08/22 0007  WBC 23.9* 18.6*  --  14.5* 13.9* 13.4*  LATICACIDVEN  --   --  0.6  --   --   --    Acute hyperkalemia AKI on CKD 4 Anion gap metabolic acidosis Sent for significant rise in potassium and creatinine level and outpatient blood work. Labs in the ED found severe hyperkalemia at 6.7 and creatinine elevated to 6.88. Acute renal failure and hyperkalemia likely secondary to obstructive uropathy in the setting of solitary right kidney. Initially given membrane stabilizing agents for hyperkalemia. Lab trend as below.  Potassium level normalized.   Creatinine steadily improving, 4.81 today.  Received IV albumin yesterday 2/28. Not on IV fluid.   Continue sodium bicarb 650 mg 3 times daily. Recent Labs  Lab 08/03/22 2107 08/04/22 0257 08/04/22 0837 08/05/22 0018 08/06/22 0020 08/07/22 0011 08/08/22 0007 08/09/22 0009  NA 133* 135   < > 135 133* 136 135 138  K 5.0 5.0   < > 5.6* 5.0 4.6 4.6 4.4  CL 102 101   < > 101 101 103 103 107  CO2 16* 19*   < > 20* 19* 18* 20* 18*  GLUCOSE 95 105*   < > 202* 139* 128* 110* 125*  BUN 84* 85*   < > 81* 79* 76* 73* 67*  CREATININE 6.74* 6.81*   < > 6.57* 6.08* 5.47* 5.13*  4.81*  CALCIUM 8.1* 7.9*   < > 7.6* 7.2* 7.1* 7.4* 7.7*  MG 1.7 1.7  --   --   --   --   --   --   PHOS  --  5.5*  --  6.3*  --   --   --  3.9   < > = values in this interval not displayed.   Obstructive uropathy Right hydronephrosis with stent in place Urology consult obtained.  2/24, underwent cystoscopy and right ureteral stent placement.  Per  operative report, there is no suspicious lesions, masses or stones.  Hypotension  H/o hypertension Blood pressure improving. PTA on metoprolol tartrate 25 mg twice daily.  Currently continued on low-dose at 12.5 mg twice daily.  Acute on chronic anemia Baseline hemoglobin more than 10.  Lately running low.  Noted a drop in hemoglobin to 7.9 on 2/24.  No evidence of active bleeding.  Hemoglobin low at 7.9 on 2/28.  Transfuse if less than 7. Continue iron supplement. Continue to monitor. Recent Labs    08/03/22 1749 08/04/22 0257 08/05/22 1201 08/06/22 0020 08/07/22 0011 08/08/22 0007  HGB 9.2* 7.9*  --  7.3* 7.7* 7.9*  MCV 96.3 94.4  --  93.7 96.0 98.5  FERRITIN  --   --  883*  --   --   --   TIBC  --   --  190*  --   --   --   IRON  --   --  77  --   --   --    COPD Chronic respiratory failure with hypoxia Past history of smoking COPD on 2 L oxygen at home.  Remains on 2 L.. Continue bronchodilators as needed   GERD PPI  Hyperlipidemia Lipitor  Restless leg syndrome Continue Requip.     Mobility: PT eval obtained.  No PT follow-up recommended.  Goals of care   Code Status: Full Code     DVT prophylaxis:  SCDs Start: 08/03/22 1957   Antimicrobials: IV ampicillin Fluid: Per nephrology. Consultants: Nephrology, urology, ID Family Communication: None at bedside  Status is: Inpatient Level of care: Progressive   Dispo: Patient is from: Home              Anticipated d/c is to: Pending clinical course Continue in-hospital care because: AKI gradually improving, pending TEE tomorrow 3/1   Scheduled Meds:  atorvastatin  40 mg Oral Daily   ferrous sulfate  325 mg Oral Q breakfast   fluticasone furoate-vilanterol  1 puff Inhalation Daily   metoprolol tartrate  12.5 mg Oral BID   pantoprazole  40 mg Oral Daily   rOPINIRole  2 mg Oral BID   sodium bicarbonate  650 mg Oral TID   umeclidinium bromide  1 puff Inhalation Daily    PRN meds: acetaminophen  **OR** acetaminophen, levalbuterol, melatonin, ondansetron (ZOFRAN) IV   Infusions:   ampicillin (OMNIPEN) IV 2 g (08/09/22 0924)    Diet:  Diet Order             Diet NPO time specified  Diet effective midnight           Diet regular Room service appropriate? Yes; Fluid consistency: Thin  Diet effective now                   Antimicrobials: Anti-infectives (From admission, onward)    Start     Dose/Rate Route Frequency Ordered Stop   08/08/22 1000  ampicillin (OMNIPEN) 2 g  in sodium chloride 0.9 % 100 mL IVPB        2 g 300 mL/hr over 20 Minutes Intravenous Every 8 hours 08/08/22 0829     08/05/22 0600  ampicillin (OMNIPEN) 2 g in sodium chloride 0.9 % 100 mL IVPB  Status:  Discontinued        2 g 300 mL/hr over 20 Minutes Intravenous 2 times daily 08/05/22 0413 08/08/22 0829   08/04/22 0915  cefTRIAXone (ROCEPHIN) 1 g in sodium chloride 0.9 % 100 mL IVPB  Status:  Discontinued        1 g 200 mL/hr over 30 Minutes Intravenous Every 24 hours 08/04/22 0904 08/05/22 0413       Skin assessment:       Nutritional status:  Body mass index is 29.79 kg/m.  Nutrition Problem: Increased nutrient needs Etiology: acute illness (pyelonephritis, bacteremia) Signs/Symptoms: estimated needs     Objective: Vitals:   08/09/22 0755 08/09/22 0853  BP:    Pulse: 100 100  Resp: 20 20  Temp:    SpO2: 98% 97%    Intake/Output Summary (Last 24 hours) at 08/09/2022 1118 Last data filed at 08/09/2022 0830 Gross per 24 hour  Intake 869.84 ml  Output 1650 ml  Net -780.16 ml   Filed Weights   08/07/22 0217 08/08/22 0528 08/09/22 0515  Weight: 74.9 kg 73.5 kg 73.9 kg   Weight change: 0.4 kg Body mass index is 29.79 kg/m.   Physical Exam: General exam: Pleasant, elderly Caucasian female.  Not in pain Skin: No rashes, lesions or ulcers. HEENT: Atraumatic, normocephalic, no obvious bleeding Lungs: Clear to auscultation bilaterally.  There is a Chemo-Port in right  anterior chest wall. CVS: Regular rate and rhythm, no murmur GI/Abd soft nontender, nondistended, bowel sound present CNS: Alert, awake, oriented x 3 Psychiatry: Mood appropriate Extremities: No pedal edema, no calf tenderness  Data Review: I have personally reviewed the laboratory data and studies available.  F/u labs ordered Unresulted Labs (From admission, onward)     Start     Ordered   08/10/22 0500  Renal function panel  Tomorrow morning,   R       Question:  Specimen collection method  Answer:  Lab=Lab collect   08/09/22 0909            Total time spent in review of labs and imaging, patient evaluation, formulation of plan, documentation and communication with family: 18 minutes  Signed, Terrilee Croak, MD [facility] 08/09/2022

## 2022-08-09 NOTE — Progress Notes (Signed)
Mobility Specialist Progress Note:   08/09/22 0853  Mobility  Activity Ambulated with assistance in hallway  Level of Assistance Modified independent, requires aide device or extra time  Assistive Device Four wheel walker  Distance Ambulated (ft) 170 ft  Activity Response Tolerated well  $Mobility charge 1 Mobility   Pt in chair willing to participate in mobility. No complaints of pain. Left in chair with call bell in reach and all needs met.   Gareth Eagle Norlan Rann Mobility Specialist Please contact via [employer] or  Rehab Office at [phone]

## 2022-08-10 ENCOUNTER — Other Ambulatory Visit (HOSPITAL_COMMUNITY): Payer: Self-pay

## 2022-08-10 ENCOUNTER — Inpatient Hospital Stay (HOSPITAL_COMMUNITY): Payer: Medicare Other | Admitting: Anesthesiology

## 2022-08-10 ENCOUNTER — Encounter: Payer: Self-pay | Admitting: Oncology

## 2022-08-10 ENCOUNTER — Encounter (HOSPITAL_COMMUNITY): Payer: Self-pay | Admitting: Internal Medicine

## 2022-08-10 ENCOUNTER — Encounter (HOSPITAL_COMMUNITY)
Admission: EM | Disposition: A | Discharge status: Home Health | Payer: Self-pay | Source: Home / Self Care | Attending: Internal Medicine

## 2022-08-10 ENCOUNTER — Inpatient Hospital Stay (HOSPITAL_COMMUNITY): Payer: Medicare Other

## 2022-08-10 ENCOUNTER — Ambulatory Visit (HOSPITAL_COMMUNITY): Admit: 2022-08-10 | Payer: Medicare Other | Admitting: Internal Medicine

## 2022-08-10 DIAGNOSIS — N179 Acute kidney failure, unspecified: Secondary | ICD-10-CM | POA: Diagnosis not present

## 2022-08-10 DIAGNOSIS — N184 Chronic kidney disease, stage 4 (severe): Secondary | ICD-10-CM | POA: Diagnosis not present

## 2022-08-10 DIAGNOSIS — I110 Hypertensive heart disease with heart failure: Secondary | ICD-10-CM

## 2022-08-10 DIAGNOSIS — R7881 Bacteremia: Secondary | ICD-10-CM

## 2022-08-10 DIAGNOSIS — Z87891 Personal history of nicotine dependence: Secondary | ICD-10-CM

## 2022-08-10 DIAGNOSIS — J449 Chronic obstructive pulmonary disease, unspecified: Secondary | ICD-10-CM

## 2022-08-10 DIAGNOSIS — I509 Heart failure, unspecified: Secondary | ICD-10-CM

## 2022-08-10 DIAGNOSIS — I700 Atherosclerosis of aorta: Secondary | ICD-10-CM

## 2022-08-10 DIAGNOSIS — E875 Hyperkalemia: Secondary | ICD-10-CM | POA: Diagnosis not present

## 2022-08-10 DIAGNOSIS — I081 Rheumatic disorders of both mitral and tricuspid valves: Secondary | ICD-10-CM

## 2022-08-10 HISTORY — PX: TEE WITHOUT CARDIOVERSION: SHX5443

## 2022-08-10 LAB — CULTURE, BLOOD (ROUTINE X 2)
Culture: NO GROWTH
Culture: NO GROWTH
Special Requests: ADEQUATE
Special Requests: ADEQUATE

## 2022-08-10 LAB — ECHO TEE

## 2022-08-10 LAB — RENAL FUNCTION PANEL
Albumin: 2.2 g/dL — ABNORMAL LOW (ref 3.5–5.0)
Anion gap: 10 (ref 5–15)
BUN: 59 mg/dL — ABNORMAL HIGH (ref 8–23)
CO2: 21 mmol/L — ABNORMAL LOW (ref 22–32)
Calcium: 7.9 mg/dL — ABNORMAL LOW (ref 8.9–10.3)
Chloride: 106 mmol/L (ref 98–111)
Creatinine, Ser: 4.25 mg/dL — ABNORMAL HIGH (ref 0.44–1.00)
GFR, Estimated: 10 mL/min — ABNORMAL LOW (ref >60)
Glucose, Bld: 98 mg/dL (ref 70–99)
Phosphorus: 4.1 mg/dL (ref 2.5–4.6)
Potassium: 4.3 mmol/L (ref 3.5–5.1)
Sodium: 137 mmol/L (ref 135–145)

## 2022-08-10 SURGERY — ECHOCARDIOGRAM, TRANSESOPHAGEAL
Anesthesia: Monitor Anesthesia Care

## 2022-08-10 MED ORDER — HEPARIN SODIUM (PORCINE) 5000 UNIT/ML IJ SOLN
5000.0000 [IU] | Freq: Two times a day (BID) | INTRAMUSCULAR | Status: DC
  Administered 2022-08-10 – 2022-08-13 (×7): 5000 [IU] via SUBCUTANEOUS
  Filled 2022-08-10 (×7): qty 1

## 2022-08-10 MED ORDER — AMOXICILLIN 500 MG PO CAPS
1000.0000 mg | ORAL_CAPSULE | Freq: Two times a day (BID) | ORAL | 0 refills | Status: AC
  Filled 2022-08-10: qty 48, 12d supply, fill #0

## 2022-08-10 MED ORDER — AMOXICILLIN 500 MG PO CAPS
1000.0000 mg | ORAL_CAPSULE | Freq: Two times a day (BID) | ORAL | Status: DC
  Administered 2022-08-10 – 2022-08-13 (×7): 1000 mg via ORAL
  Filled 2022-08-10 (×7): qty 2

## 2022-08-10 MED ORDER — PROPOFOL 500 MG/50ML IV EMUL
INTRAVENOUS | Status: DC | PRN
  Administered 2022-08-10: 150 ug/kg/min via INTRAVENOUS

## 2022-08-10 NOTE — Anesthesia Procedure Notes (Signed)
Procedure Name: MAC Date/Time: 08/10/2022 7:50 AM  Performed by: Lieutenant Diego, CRNAPre-anesthesia Checklist: Patient identified, Emergency Drugs available, Suction available, Patient being monitored and Timeout performed Patient Re-evaluated:Patient Re-evaluated prior to induction Oxygen Delivery Method: Nasal cannula Preoxygenation: Pre-oxygenation with 100% oxygen Induction Type: IV induction

## 2022-08-10 NOTE — CV Procedure (Signed)
TRANSESOPHAGEAL ECHOCARDIOGRAM (TEE) NOTE  INDICATIONS: infective endocarditis  PROCEDURE:   Informed consent was obtained prior to the procedure. The risks, benefits and alternatives for the procedure were discussed and the patient comprehended these risks.  Risks include, but are not limited to, cough, sore throat, vomiting, nausea, somnolence, esophageal and stomach trauma or perforation, bleeding, low blood pressure, aspiration, pneumonia, infection, trauma to the teeth and death.    After a procedural time-out, the patient was given propofol for sedation by anesthesia. See their separate report.  The patient's heart rate, blood pressure, and oxygen saturation are monitored continuously during the procedure.The oropharynx was anesthetized with topical cetacaine.  The transesophageal probe was inserted in the esophagus and stomach without difficulty and multiple views were obtained.  The patient was kept under observation until the patient left the procedure room.  I was present face-to-face 100% of this time. The patient left the procedure room in stable condition.   Agitated microbubble saline contrast was not administered.  COMPLICATIONS:    There were no immediate complications.  Findings:  LEFT VENTRICLE: The left ventricular wall thickness is normal.  The left ventricular cavity is normal in size. Wall motion is normal.  LVEF is 60-65%.  RIGHT VENTRICLE:  The right ventricle is normal in structure and function without any thrombus or masses.    LEFT ATRIUM:  The left atrium is normal in size without any thrombus or masses.  There is not spontaneous echo contrast ("smoke") in the left atrium consistent with a low flow state.  LEFT ATRIAL APPENDAGE:  The small left atrial appendage is free of any thrombus or masses. The appendage has single lobes. Pulse doppler indicates moderate flow in the appendage.  ATRIAL SEPTUM:  The atrial septum appears intact and is free of thrombus  and/or masses.  There is no evidence for interatrial shunting by color doppler.  RIGHT ATRIUM:  The right atrium is normal in size and function without any thrombus or masses.  MITRAL VALVE:  The mitral valve is normal in structure and function with  trivial  regurgitation.  There were no vegetations or stenosis.  AORTIC VALVE:  The aortic valve is trileaflet, mildly sclerotic with normal function with  no  regurgitation.  There were no vegetations or stenosis.  TRICUSPID VALVE:  The tricuspid valve is normal in structure and function with  trivial  regurgitation.  There were no vegetations or stenosis   PULMONIC VALVE:  The pulmonic valve is normal in structure and function with  no  regurgitation.  There were no vegetations or stenosis.   AORTIC ARCH, ASCENDING AND DESCENDING AORTA:  There was grade 1 Ron Parker et. Al, 1992) atherosclerosis of the aortic arch and  proximal descending aorta.  12. PULMONARY VEINS: Anomalous pulmonary venous return was not noted.  13. PERICARDIUM: The pericardium appeared normal and non-thickened.  There is no pericardial effusion.  IMPRESSION:   No endocarditis Negative for PFO No LAA thrombus No significant valve disease Mild aortic atherosclerosis (grade 1) LVEF 60-65% with normal wall motion  RECOMMENDATIONS:     Treatment of bacteremia without endocarditis.  Time Spent Directly with the Patient:  30 minutes   Pixie Casino, MD, [facility], [place] Director of the [facility] &  Cardiovascular [facility] Reduction Clinic Diplomate of the American Board of Clinical Lipidology Attending Cardiologist  Direct Dial: [phone]  Fax: [phone]  Website:  [email or web address] Kore Madlock 08/10/2022, 8:06 AM

## 2022-08-10 NOTE — Anesthesia Postprocedure Evaluation (Signed)
Anesthesia Post Note  Patient: Alexandra [name]  Procedure(s) Performed: TRANSESOPHAGEAL ECHOCARDIOGRAM (TEE)     Patient location during evaluation: PACU Anesthesia Type: MAC Level of consciousness: awake and alert Pain management: pain level controlled Vital Signs Assessment: post-procedure vital signs reviewed and stable Respiratory status: spontaneous breathing, nonlabored ventilation, respiratory function stable and patient connected to nasal cannula oxygen Cardiovascular status: stable and blood pressure returned to baseline Postop Assessment: no apparent nausea or vomiting Anesthetic complications: no  No notable events documented.  Last Vitals:  Vitals:   08/10/22 0832 08/10/22 0833  BP:    Pulse: 92 84  Resp: 18 16  Temp:  36.9 C  SpO2: 98% 96%    Last Pain:  Vitals:   08/10/22 0845  TempSrc:   PainSc: 0-No pain                 Effie Berkshire

## 2022-08-10 NOTE — Progress Notes (Signed)
[facility] NEPHROLOGY PROGRESS NOTE  Assessment/ Plan: Pt is a 82 y.o. yo female with hypertension, renal cell carcinoma status post left total nephrectomy in 1999, COPD, CKD stage IV admitted with decreased urine output and abnormal lab results including hyperkalemia and AKI.  # Acute kidney injury secondary to obstructive uropathy in the solitary kidney, also with ischemic and prerenal insults with marginal blood pressure and bacteremia.  Status post ureteral stent exchange by urology. Urine output has increased associated with continuing improvement in BUN and creatinine level.  No sign or symptoms of uremia.  No need for dialysis.  Expect continued renal recovery.  She can follow outpatient with Dr. Hollie Salk as well as with [facility].   # Obstructive uropathy status post right ureteral stent exchange by urologist on 2/24.  Management per urology.   # E faecalis bacteremia likely due to pyelonephritis: On ampicillin, TEE with no vegetation or infective endocarditis.  On ampicillin per ID.  # Hyperkalemia: Improved with Lokelma, bicarbonate.  Monitor lab.   # Metabolic acidosis due to AKI, continue oral sodium bicarbonate.   # Solitary kidney  - note hx of remote left total nephrectomy for renal cell   # CKD stage IV - Baseline Cr near 3 and follows with [facility] - Dr. Hollie Salk   # Hypertension: Blood pressure variable with some episode of hypotension.  Currently only on low-dose metoprolol.  # Anemia:  Iron stores acceptable.  Received Aranesp on 2/26.  Monitor hemoglobin and transfuse as needed.  Sign off, please call us back with question.  Patient will follow with Dr. Hollie Salk at [facility].  Discussed with the patient.  Subjective: [name].  Urine output recorded 1.6 L.  No nausea, vomiting, dysgeusia, chest pain or shortness of breath.  Had  TEE this morning.  Objective Vital signs in last 24 hours: Vitals:   08/10/22  0815 08/10/22 0830 08/10/22 0832 08/10/22 0833  BP: 134/65 132/68    Pulse: 86 90 92 84  Resp: (!) '23 20 18 16  '$ Temp:    98.4 F (36.9 C)  TempSrc:      SpO2: 95% 97% 98% 96%  Weight:      Height:       Weight change: -0.6 kg  Intake/Output Summary (Last 24 hours) at 08/10/2022 1042 Last data filed at 08/10/2022 1000 Gross per 24 hour  Intake 1575.33 ml  Output 1325 ml  Net 250.33 ml        Labs: RENAL PANEL Recent Labs    06/06/22 0917 06/13/22 1021 07/03/22 1630 07/31/22 1415 08/03/22 1030 08/03/22 1749 08/03/22 2107 08/04/22 0257 08/04/22 0837 08/04/22 1810 08/05/22 0018 08/06/22 0020 08/07/22 0011 08/08/22 0007 08/09/22 0009 08/10/22 0030  NA 139 132* 146* 136 132*   < > 133* 135 132* 134* 135 133* 136 135 138 137  K 4.9 5.1 5.6* 6.3* 6.7*   < > 5.0 5.0 5.2* 5.7* 5.6* 5.0 4.6 4.6 4.4 4.3  CL 112* 107 104 99 100   < > '102 101 98 101 101 101 103 103 107 106 '$  CO2 19* 18* 20 18* 17*   < > 16* 19* 19* 20* 20* 19* 18* 20* 18* 21*  GLUCOSE 104* 108* 115* 100* 96   < > 95 105* 104* 104* 202* 139* 128* 110* 125* 98  BUN 55* 70* 110* 56* 84*   < > 84* 85* 81* 79* 81* 79* 76* 73* 67* 59*  CREATININE 2.97*  3.05* 3.58* 3.68* 6.84*   < > 6.74* 6.81* 6.87* 6.61* 6.57* 6.08* 5.47* 5.13* 4.81* 4.25*  CALCIUM 8.3* 8.2* 8.9 8.8 8.1*   < > 8.1* 7.9* 7.7* 7.6* 7.6* 7.2* 7.1* 7.4* 7.7* 7.9*  MG  --   --   --   --   --   --  1.7 1.7  --   --   --   --   --   --   --   --   PHOS  --   --   --   --   --   --   --  5.5*  --   --  6.3*  --   --   --  3.9 4.1  ALBUMIN 3.0* 3.1* 3.5* 3.5* 3.3*  --   --  2.1*  --   --  2.0*  --   --  1.8* 2.4* 2.2*   < > = values in this interval not displayed.      Liver Function Tests: Recent Labs  Lab 08/04/22 0257 08/05/22 0018 08/08/22 0007 08/09/22 0009 08/10/22 0030  AST 18  --   --   --   --   ALT 16  --   --   --   --   ALKPHOS 65  --   --   --   --   BILITOT 0.6  --   --   --   --   PROT 5.2*  --   --   --   --   ALBUMIN 2.1*   <  > 1.8* 2.4* 2.2*   < > = values in this interval not displayed.    No results for input(s): "LIPASE", "AMYLASE" in the last 168 hours. No results for input(s): "AMMONIA" in the last 168 hours. CBC: Recent Labs    08/03/22 1749 08/04/22 0257 08/05/22 1201 08/06/22 0020 08/07/22 0011 08/08/22 0007  HGB 9.2* 7.9*  --  7.3* 7.7* 7.9*  MCV 96.3 94.4  --  93.7 96.0 98.5  FERRITIN  --   --  883*  --   --   --   TIBC  --   --  190*  --   --   --   IRON  --   --  77  --   --   --      Cardiac Enzymes: Recent Labs  Lab 08/03/22 2107  CKTOTAL 43    CBG: No results for input(s): "GLUCAP" in the last 168 hours.  Iron Studies:  No results for input(s): "IRON", "TIBC", "TRANSFERRIN", "FERRITIN" in the last 72 hours.  Studies/Results: ECHO TEE  Result Date: 08/10/2022    TRANSESOPHOGEAL ECHO REPORT   Patient Name:   Alexandra [name] Date of Exam: 08/10/2022 Medical Rec #:  [id number]       Height:       62.0 in Accession #:    [id number]      Weight:       161.6 lb Date of Birth:  19-Apr-1941       BSA:          1.746 m Patient Age:    72 years        BP:           144/69 mmHg Patient Gender: F               HR:           90 bpm. Exam Location:  Inpatient  Procedure: Transesophageal Echo, 3D Echo, Color Doppler and Cardiac Doppler Indications:     Bacteremia  History:         Patient has prior history of Echocardiogram examinations, most                  recent 08/08/2022. COPD; Risk Factors:Hypertension and                  Dyslipidemia.  Sonographer:     Raquel Sarna Senior RDCS Referring Phys:  Vikki Ports, R Diagnosing Phys: Lyman Bishop MD PROCEDURE: After discussion of the risks and benefits of a TEE, an informed consent was obtained from the patient. The transesophogeal probe was passed without difficulty through the esophogus of the patient. Sedation performed by different physician. The patient was monitored while under deep sedation. Anesthestetic sedation was provided intravenously by  Anesthesiology: '95mg'$  of Propofol. The patient developed no complications during the procedure.  IMPRESSIONS  1. Left ventricular ejection fraction, by estimation, is 60 to 65%. The left ventricle has normal function. The left ventricle has no regional wall motion abnormalities.  2. Right ventricular systolic function is normal. The right ventricular size is normal.  3. No left atrial/left atrial appendage thrombus was detected.  4. The mitral valve is grossly normal. Trivial mitral valve regurgitation.  5. The aortic valve is tricuspid. Aortic valve regurgitation is not visualized. Aortic valve sclerosis is present, with no evidence of aortic valve stenosis. Conclusion(s)/Recommendation(s): No evidence of vegetation/infective endocarditis on this transesophageael echocardiogram. FINDINGS  Left Ventricle: Left ventricular ejection fraction, by estimation, is 60 to 65%. The left ventricle has normal function. The left ventricle has no regional wall motion abnormalities. The left ventricular internal cavity size was normal in size. There is  no left ventricular hypertrophy. Right Ventricle: The right ventricular size is normal. No increase in right ventricular wall thickness. Right ventricular systolic function is normal. Left Atrium: Left atrial size was normal in size. No left atrial/left atrial appendage thrombus was detected. Right Atrium: Right atrial size was normal in size. Pericardium: There is no evidence of pericardial effusion. Mitral Valve: The mitral valve is grossly normal. Trivial mitral valve regurgitation. Tricuspid Valve: The tricuspid valve is grossly normal. Tricuspid valve regurgitation is trivial. Aortic Valve: The aortic valve is tricuspid. Aortic valve regurgitation is not visualized. Aortic valve sclerosis is present, with no evidence of aortic valve stenosis. Pulmonic Valve: The pulmonic valve was normal in structure. Pulmonic valve regurgitation is not visualized. Aorta: The aortic root and  ascending aorta are structurally normal, with no evidence of dilitation. There is minimal (Grade I) plaque. Venous: The left upper pulmonary vein is normal. IAS/Shunts: No atrial level shunt detected by color flow Doppler. Additional Comments: Spectral Doppler performed. Lyman Bishop MD Electronically signed by Lyman Bishop MD Signature Date/Time: 08/10/2022/8:34:02 AM    Final    IR REMOVAL TUN ACCESS W/ PORT W/O FL MOD SED  Result Date: 08/08/2022 CLINICAL DATA:  Bacteremia (admitted with pyelonephritis). Request made for removal of Port a Catheter The port a Catheter was surgically placed on 05/07/2022 at [facility]. EXAM: REMOVAL OF IMPLANTED TUNNELED PORT-A-CATH MEDICATIONS: None, the patient is currently admitted to the hospital receiving intravenous antibiotics. The antibiotic was administered within 1 hour prior to the start of the procedure. ANESTHESIA/SEDATION: None FLUOROSCOPY: None PROCEDURE: Informed written consent was obtained from the patient after a discussion of the risk, benefits and alternatives to the procedure. The patient was positioned supine on the fluoroscopy table and the  right chest Port-A-Cath site was prepped with chlorhexidine. A sterile gown and gloves were worn during the procedure. Local anesthesia was provided with 1% lidocaine with epinephrine. A timeout was performed prior to the initiation of the procedure. An incision was made overlying the Port-A-Cath with a #15 scalpel. Utilizing sharp and blunt dissection, the Port-A-Cath was removed completely. As there was no obvious signs of port a catheter reservoir infection the decision was made to close the port a catheter pocket the primary intention. The pocked was irrigated with sterile saline. Wound closure was performed with interrupted subcutaneous 2-0 Vicryl sutures, Dermabond and Steri-Strips. Dressings were applied. The patient tolerated the procedure well without immediate post procedural complication. FINDINGS:  Successful removal of implant Port-A-Cath without immediate post procedural complication. IMPRESSION: Successful removal of implanted Port-A-Cath. Electronically Signed   By: Sandi Mariscal M.D.   On: 08/08/2022 12:15    Medications: Infusions:  ampicillin (OMNIPEN) IV Stopped (08/10/22 0935)    Scheduled Medications:  atorvastatin  40 mg Oral Daily   ferrous sulfate  325 mg Oral Q breakfast   fluticasone furoate-vilanterol  1 puff Inhalation Daily   metoprolol tartrate  12.5 mg Oral BID   pantoprazole  40 mg Oral Daily   rOPINIRole  2 mg Oral BID   sodium bicarbonate  650 mg Oral TID   umeclidinium bromide  1 puff Inhalation Daily    have reviewed scheduled and prn medications.  Physical Exam: General:NAD, comfortable Heart:RRR, s1s2 nl Lungs:clear b/l, no crackle Abdomen:soft, Non-tender, non-distended Extremities: Trace peripheral lower extremities edema Neurology: Alert, awake and following commands.  Melanye Hiraldo Prasad Slayter Moorhouse 08/10/2022,10:42 AM  LOS: 7 days

## 2022-08-10 NOTE — Progress Notes (Signed)
  Progress Note   Patient: Alexandra [name] [id number] DOB: 08-10-1940 DOA: 08/03/2022     7 DOS: the patient was seen and examined on 08/10/2022   Brief hospital course:  Assessment and Plan:  Sepsis secondary to acute right pyelonephritis POA Enterococcus faecalis bacteremia - Amoxicillin 1000 mg PO q12  - s/p TEE on 08/10/2022 (no endocarditis noted)  Acute hyperkalemia AKI on CKD 4 Anion gap metabolic acidosis - Sodium bicarb  650 mg PO tid    Obstructive uropathy Right hydronephrosis with stent in place - 2/24, underwent cystoscopy and right ureteral stent placement.  Per operative report, there is no suspicious lesions, masses or stones.   Hypotension  H/o hypertension - Lopressor 12.5 mg pO bi d   Acute on chronic anemia - Ferrous sulfate 325 mg PO daily    COPD Chronic respiratory failure with hypoxia Past history of smoking - Breo ellipta 1 puff daily  - Incruse ellipta 1 puff daily  - Xopenex q6 hr PRN    GERD - Protonix 40 mg PO daily    Hyperlipidemia - Lipitor 40 mg PO daily    Restless leg syndrome - Requip 2 mg PO bid   DVT prophylaxis: Heparin 5000 units sq q12     Subjective: Pt seen and examined at the bedside. TEE completed today did not show any endocarditis. Pt continues on PO amoxicillin. Appreciate ID following along.   Physical Exam: Vitals:   08/10/22 0830 08/10/22 0832 08/10/22 0833 08/10/22 1205  BP: 132/68   (!) 121/58  Pulse: 90 92 84 93  Resp: '20 18 16 20  '$ Temp:   98.4 F (36.9 C) (!) 97.5 F (36.4 C)  TempSrc:    Oral  SpO2: 97% 98% 96% 98%  Weight:      Height:       Physical Exam Constitutional:      Appearance: Normal appearance.  HENT:     Head: Normocephalic and atraumatic.     Mouth/Throat:     Mouth: Mucous membranes are moist.  Cardiovascular:     Rate and Rhythm: Normal rate and regular rhythm.  Pulmonary:     Effort: Pulmonary effort is normal.  Abdominal:     Palpations: Abdomen is soft.   Musculoskeletal:        General: Normal range of motion.     Cervical back: Neck supple.  Skin:    General: Skin is warm.  Neurological:     Mental Status: She is alert. Mental status is at baseline.  Psychiatric:        Mood and Affect: Mood normal.    Data Reviewed:   Disposition: Status is: Inpatient  Planned Discharge Destination: Home    Time spent: 35 minutes  Author: Lucienne Minks , MD 08/10/2022 12:18 PM  For on call review [email or web address].

## 2022-08-10 NOTE — Progress Notes (Addendum)
[facility] for Infectious Disease  Date of Admission:  08/03/2022   Total days of inpatient antibiotics 3  Principal Problem:   Acute renal failure superimposed on stage 4 chronic kidney disease (HCC) Active Problems:   Hyperlipidemia   History of anemia due to chronic kidney disease   Hyperkalemia   GERD (gastroesophageal reflux disease)   Restless leg syndrome   High anion gap metabolic acidosis   SIRS (systemic inflammatory response syndrome) (HCC)   Leukocytosis   History of COPD          Assessment: 81 YF with CKD stage IV, renal cell carcinoma status post total left nephrectomy 1999 presented to ED due to elevated creatinine at 6.8K.  Baseline creatinine is 2.6-3.6.   #E faecalis bacteremia likely secondary to pyelonephritis #Pyelonephritis #AKI on CKD - CT renal study showed right hydronephrosis despite ureteral stent, may represent stent malfunction.  Stranding along the right ureter may indicate pyonephritis. - Blood culture from 2/24 + E faecalis, patient on ampicillin. 2/25 NG - Pt states she has been on abx till Friday for an " infection", does not know the name.   #History of pericarditis -06/20/22 TTE showed pericardial effusion.  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 -Pt followed by oncology, Dr. Luanna Salk. Chemotherapy started 05/08/22 via port -Reached out to Dr. Karle Starch, ok with port removal -Port removed on 2/28  Recommendations: -D/C ampicillin, start amox 1gm bid(renally dosed) to complete 2 weeks of abx from port removal EOT 3/12 -F/U with ID on 3/18 with myself   ID will sign off Microbiology:   Antibiotics: Ampicillin Cultures: Blood 2/24 2/2  Efaecalis 2/25 NG Urine 2/21 E faecalis   SUBJECTIVE: Resting in bed. No new complaints Interval: Afebrile overnight  Review of Systems: Review of Systems  All other  systems reviewed and are negative.    Scheduled Meds:  amoxicillin  1,000 mg Oral Q12H   atorvastatin  40 mg Oral Daily   ferrous sulfate  325 mg Oral Q breakfast   fluticasone furoate-vilanterol  1 puff Inhalation Daily   heparin injection (subcutaneous)  5,000 Units Subcutaneous Q12H   metoprolol tartrate  12.5 mg Oral BID   pantoprazole  40 mg Oral Daily   rOPINIRole  2 mg Oral BID   sodium bicarbonate  650 mg Oral TID   umeclidinium bromide  1 puff Inhalation Daily   Continuous Infusions:   PRN Meds:.acetaminophen **OR** acetaminophen, levalbuterol, melatonin, ondansetron (ZOFRAN) IV Allergies  Allergen Reactions   Contrast Media [Iodinated Contrast Media] Hives, Itching and Rash   Omeprazole Itching   Shellfish Allergy Nausea And Vomiting   Shellfish-Derived Products Nausea And Vomiting    OBJECTIVE: Vitals:   08/10/22 0830 08/10/22 0832 08/10/22 0833 08/10/22 1205  BP: 132/68   (!) 121/58  Pulse: 90 92 84 93  Resp: '20 18 16 20  '$ Temp:   98.4 F (36.9 C) (!) 97.5 F (36.4 C)  TempSrc:    Oral  SpO2: 97% 98% 96% 98%  Weight:      Height:       Body mass index is 29.56 kg/m.  Physical Exam Constitutional:      Appearance: Normal appearance.  HENT:     Head: Normocephalic and atraumatic.     Right Ear: Tympanic membrane normal.     Left Ear: Tympanic membrane normal.  Nose: Nose normal.     Mouth/Throat:     Mouth: Mucous membranes are moist.  Eyes:     Extraocular Movements: Extraocular movements intact.     Conjunctiva/sclera: Conjunctivae normal.     Pupils: Pupils are equal, round, and reactive to light.  Cardiovascular:     Rate and Rhythm: Normal rate and regular rhythm.     Heart sounds: No murmur heard.    No friction rub. No gallop.  Pulmonary:     Effort: Pulmonary effort is normal.     Breath sounds: Normal breath sounds.  Abdominal:     General: Abdomen is flat.     Palpations: Abdomen is soft.  Musculoskeletal:        General:  Normal range of motion.  Skin:    General: Skin is warm and dry.  Neurological:     General: No focal deficit present.     Mental Status: She is alert and oriented to person, place, and time.  Psychiatric:        Mood and Affect: Mood normal.       Lab Results Lab Results  Component Value Date   WBC 13.4 (H) 08/08/2022   HGB 7.9 (L) 08/08/2022   HCT 25.7 (L) 08/08/2022   MCV 98.5 08/08/2022   PLT 248 08/08/2022    Lab Results  Component Value Date   CREATININE 4.25 (H) 08/10/2022   BUN 59 (H) 08/10/2022   NA 137 08/10/2022   K 4.3 08/10/2022   CL 106 08/10/2022   CO2 21 (L) 08/10/2022    Lab Results  Component Value Date   ALT 16 08/04/2022   AST 18 08/04/2022   ALKPHOS 65 08/04/2022   BILITOT 0.6 08/04/2022        Laurice Record, [facility] 08/10/2022, 12:11 PM

## 2022-08-10 NOTE — Interval H&P Note (Signed)
History and Physical Interval Note:  08/10/2022 7:33 AM  Alexandra [name]  has presented today for surgery, with the diagnosis of CALCIFIED NODULE AORTIC VALVE.  The various methods of treatment have been discussed with the patient and family. After consideration of risks, benefits and other options for treatment, the patient has consented to  Procedure(s): TRANSESOPHAGEAL ECHOCARDIOGRAM (TEE) (N/A) as a surgical intervention.  The patient's history has been reviewed, patient examined, no change in status, stable for surgery.  I have reviewed the patient's chart and labs.  Questions were answered to the patient's satisfaction.     Pixie Casino

## 2022-08-10 NOTE — Anesthesia Preprocedure Evaluation (Addendum)
Anesthesia Evaluation  Patient identified by MRN, date of birth, ID band Patient awake    Reviewed: Allergy & Precautions, NPO status , Patient's Chart, lab work & pertinent test results  Airway Mallampati: I  TM Distance: >3 FB Neck ROM: Full    Dental  (+) Edentulous Upper, Edentulous Lower   Pulmonary COPD,  COPD inhaler, former smoker    + decreased breath sounds      Cardiovascular hypertension, Pt. on home beta blockers +CHF   Rhythm:Regular Rate:Normal  Echo: 1. Left ventricular ejection fraction, by estimation, is 60 to 65%. The  left ventricle has normal function. The left ventricle has no regional  wall motion abnormalities. Left ventricular diastolic parameters were  normal.   2. Right ventricular systolic function is normal. The right ventricular  size is normal.   3. The mitral valve is normal in structure. Trivial mitral valve  regurgitation. No evidence of mitral stenosis.   4. The aortic valve is tricuspid. There is mild calcification of the  aortic valve. Aortic valve regurgitation is not visualized. Aortic valve  sclerosis/calcification is present, without any evidence of aortic  stenosis.   5. The inferior vena cava is normal in size with greater than 50%  respiratory variability, suggesting right atrial pressure of 3 mmHg.   6. I was asked to re-review the echo regarding the aortic valve. There is  a small focal calcified nodule on the right coronary cusp of the aortic  valve.     Neuro/Psych negative neurological ROS  negative psych ROS   GI/Hepatic Neg liver ROS,GERD  ,,  Endo/Other  negative endocrine ROS    Renal/GU Renal disease     Musculoskeletal negative musculoskeletal ROS (+)    Abdominal Normal abdominal exam  (+)   Peds  Hematology negative hematology ROS (+)   Anesthesia Other Findings   Reproductive/Obstetrics                             Anesthesia  Physical Anesthesia Plan  ASA: 3  Anesthesia Plan: MAC   Post-op Pain Management: Minimal or no pain anticipated   Induction: Intravenous  PONV Risk Score and Plan: 0 and Propofol infusion  Airway Management Planned: Natural Airway and Simple Face Mask  Additional Equipment: None  Intra-op Plan:   Post-operative Plan:   Informed Consent: I have reviewed the patients History and Physical, chart, labs and discussed the procedure including the risks, benefits and alternatives for the proposed anesthesia with the patient or authorized representative who has indicated his/her understanding and acceptance.       Plan Discussed with: CRNA  Anesthesia Plan Comments:        Anesthesia Quick Evaluation

## 2022-08-10 NOTE — Progress Notes (Signed)
Mobility Specialist - Progress Note   08/10/22 1221  Mobility  Activity Ambulated independently in hallway  Level of Assistance Modified independent, requires aide device or extra time  Assistive Device Four wheel walker  Distance Ambulated (ft) 170 ft  Activity Response Tolerated well  Mobility Referral Yes  $Mobility charge 1 Mobility    Pt received in recliner and agreeable. No complaints during walk, no physical assistance needed. Left in recliner w/ call bell in reach and all needs met.   [place] Specialist Please contact via [employer] or Rehab office at [phone]

## 2022-08-10 NOTE — Transfer of Care (Signed)
Immediate Anesthesia Transfer of Care Note  Patient: Alexandra [name]  Procedure(s) Performed: TRANSESOPHAGEAL ECHOCARDIOGRAM (TEE)  Patient Location: PACU  Anesthesia Type:MAC  Level of Consciousness: awake  Airway & Oxygen Therapy: Patient Spontanous Breathing and Patient connected to nasal cannula oxygen  Post-op Assessment: Report given to RN and Post -op Vital signs reviewed and stable  Post vital signs: Reviewed and stable  Last Vitals:  Vitals Value Taken Time  BP 105/60 08/10/22 0808  Temp    Pulse 86 08/10/22 0809  Resp 25 08/10/22 0809  SpO2 92 % 08/10/22 0809  Vitals shown include unvalidated device data.  Last Pain:  Vitals:   08/10/22 0730  TempSrc: Temporal  PainSc: 0-No pain         Complications: No notable events documented.

## 2022-08-11 DIAGNOSIS — E875 Hyperkalemia: Secondary | ICD-10-CM | POA: Diagnosis not present

## 2022-08-11 LAB — COMPREHENSIVE METABOLIC PANEL
ALT: 6 U/L (ref 0–44)
AST: 16 U/L (ref 15–41)
Albumin: 2.1 g/dL — ABNORMAL LOW (ref 3.5–5.0)
Alkaline Phosphatase: 46 U/L (ref 38–126)
Anion gap: 11 (ref 5–15)
BUN: 52 mg/dL — ABNORMAL HIGH (ref 8–23)
CO2: 22 mmol/L (ref 22–32)
Calcium: 7.7 mg/dL — ABNORMAL LOW (ref 8.9–10.3)
Chloride: 103 mmol/L (ref 98–111)
Creatinine, Ser: 3.96 mg/dL — ABNORMAL HIGH (ref 0.44–1.00)
GFR, Estimated: 11 mL/min — ABNORMAL LOW (ref >60)
Glucose, Bld: 96 mg/dL (ref 70–99)
Potassium: 4.1 mmol/L (ref 3.5–5.1)
Sodium: 136 mmol/L (ref 135–145)
Total Bilirubin: 0.7 mg/dL (ref 0.3–1.2)
Total Protein: 4.8 g/dL — ABNORMAL LOW (ref 6.5–8.1)

## 2022-08-11 LAB — CBC
HCT: 22.6 % — ABNORMAL LOW (ref 36.0–46.0)
Hemoglobin: 7.3 g/dL — ABNORMAL LOW (ref 12.0–15.0)
MCH: 30.9 pg (ref 26.0–34.0)
MCHC: 32.3 g/dL (ref 30.0–36.0)
MCV: 95.8 fL (ref 80.0–100.0)
Platelets: 183 10*3/uL (ref 150–400)
RBC: 2.36 MIL/uL — ABNORMAL LOW (ref 3.87–5.11)
RDW: 18.8 % — ABNORMAL HIGH (ref 11.5–15.5)
WBC: 13.6 10*3/uL — ABNORMAL HIGH (ref 4.0–10.5)
nRBC: 0.2 % (ref 0.0–0.2)

## 2022-08-11 LAB — PHOSPHORUS: Phosphorus: 4.4 mg/dL (ref 2.5–4.6)

## 2022-08-11 LAB — MAGNESIUM: Magnesium: 1.1 mg/dL — ABNORMAL LOW (ref 1.7–2.4)

## 2022-08-11 LAB — C-REACTIVE PROTEIN: CRP: 6.1 mg/dL — ABNORMAL HIGH (ref <1.0)

## 2022-08-11 MED ORDER — MAGNESIUM SULFATE 4 GM/100ML IV SOLN
4.0000 g | Freq: Once | INTRAVENOUS | Status: AC
  Administered 2022-08-11: 4 g via INTRAVENOUS
  Filled 2022-08-11: qty 100

## 2022-08-11 NOTE — Progress Notes (Signed)
Mobility Specialist Progress Note:   08/11/22 1045  Mobility  Activity Ambulated with assistance in hallway  Level of Assistance Modified independent, requires aide device or extra time  Assistive Device Four wheel walker  Distance Ambulated (ft) 100 ft  Activity Response Tolerated well  $Mobility charge 1 Mobility   Pt in chair willing to participate in mobility. Complaints of R hip pain. Left in chair with call bell in reach and all needs met.   Gareth Eagle Hadriel Northup Mobility Specialist Please contact via [employer] or  Rehab Office at [phone]

## 2022-08-11 NOTE — Progress Notes (Signed)
  Progress Note   Patient: Alexandra [name] [id number] DOB: 1940/07/17 DOA: 08/03/2022     8 DOS: the patient was seen and examined on 08/11/2022   Brief hospital course:  Assessment and Plan: Sepsis secondary to acute right pyelonephritis POA Enterococcus faecalis bacteremia - Amoxicillin 1000 mg PO q12  - s/p TEE on 08/10/2022 (no endocarditis noted)   Acute hyperkalemia AKI on CKD 4 Anion gap metabolic acidosis - Sodium bicarb  650 mg PO tid    Obstructive uropathy Right hydronephrosis with stent in place - 2/24, underwent cystoscopy and right ureteral stent placement.  Per operative report, there is no suspicious lesions, masses or stones.   Hypotension  H/o hypertension - Lopressor 12.5 mg PO bid   Acute on chronic anemia - Ferrous sulfate 325 mg PO daily     COPD Chronic respiratory failure with hypoxia Past history of smoking - Breo ellipta 1 puff daily  - Incruse ellipta 1 puff daily  - Xopenex q6 hr PRN    GERD - Protonix 40 mg PO daily     Hyperlipidemia - Lipitor 40 mg PO daily     Restless leg syndrome - Requip 2 mg PO bid    DVT prophylaxis: Heparin 5000 units sq q12     Subjective: Pt seen and examined at the bedside. Labs stable. Mag being replaced today. Continues with PO amoxicillin.   Physical Exam: Vitals:   08/11/22 0408 08/11/22 0500 08/11/22 0737 08/11/22 0846  BP: 135/64  130/69   Pulse: 97  98   Resp: (!) 21  20   Temp: 98.3 F (36.8 C)  98.6 F (37 C)   TempSrc: Oral  Oral   SpO2: 95%  98% 94%  Weight:  73 kg    Height:       Constitutional:      Appearance: Normal appearance.  HENT:     Head: Normocephalic and atraumatic.     Mouth/Throat:     Mouth: Mucous membranes are moist.  Cardiovascular:     Rate and Rhythm: Normal rate and regular rhythm.  Pulmonary:     Effort: Pulmonary effort is normal.  Abdominal:     Palpations: Abdomen is soft.  Musculoskeletal:        General: Normal range of motion.     Cervical  back: Neck supple.  Skin:    General: Skin is warm.  Neurological:     Mental Status: She is alert. Mental status is at baseline.  Psychiatric:        Mood and Affect: Mood normal.   Data Reviewed:   Disposition: Status is: Inpatient  Planned Discharge Destination: Home    Time spent: 35 minutes  Author: Lucienne Minks , MD 08/11/2022 10:32 AM  For on call review [email or web address].

## 2022-08-12 DIAGNOSIS — E875 Hyperkalemia: Secondary | ICD-10-CM | POA: Diagnosis not present

## 2022-08-12 LAB — CBC
HCT: 24.5 % — ABNORMAL LOW (ref 36.0–46.0)
Hemoglobin: 7.7 g/dL — ABNORMAL LOW (ref 12.0–15.0)
MCH: 30.6 pg (ref 26.0–34.0)
MCHC: 31.4 g/dL (ref 30.0–36.0)
MCV: 97.2 fL (ref 80.0–100.0)
Platelets: 201 10*3/uL (ref 150–400)
RBC: 2.52 MIL/uL — ABNORMAL LOW (ref 3.87–5.11)
RDW: 19 % — ABNORMAL HIGH (ref 11.5–15.5)
WBC: 12.4 10*3/uL — ABNORMAL HIGH (ref 4.0–10.5)
nRBC: 0.2 % (ref 0.0–0.2)

## 2022-08-12 LAB — PHOSPHORUS: Phosphorus: 4.3 mg/dL (ref 2.5–4.6)

## 2022-08-12 LAB — COMPREHENSIVE METABOLIC PANEL
ALT: 5 U/L (ref 0–44)
AST: 14 U/L — ABNORMAL LOW (ref 15–41)
Albumin: 2.1 g/dL — ABNORMAL LOW (ref 3.5–5.0)
Alkaline Phosphatase: 49 U/L (ref 38–126)
Anion gap: 9 (ref 5–15)
BUN: 50 mg/dL — ABNORMAL HIGH (ref 8–23)
CO2: 22 mmol/L (ref 22–32)
Calcium: 7.8 mg/dL — ABNORMAL LOW (ref 8.9–10.3)
Chloride: 104 mmol/L (ref 98–111)
Creatinine, Ser: 3.75 mg/dL — ABNORMAL HIGH (ref 0.44–1.00)
GFR, Estimated: 12 mL/min — ABNORMAL LOW (ref >60)
Glucose, Bld: 102 mg/dL — ABNORMAL HIGH (ref 70–99)
Potassium: 4.2 mmol/L (ref 3.5–5.1)
Sodium: 135 mmol/L (ref 135–145)
Total Bilirubin: 0.3 mg/dL (ref 0.3–1.2)
Total Protein: 5.1 g/dL — ABNORMAL LOW (ref 6.5–8.1)

## 2022-08-12 LAB — C-REACTIVE PROTEIN: CRP: 6.9 mg/dL — ABNORMAL HIGH (ref <1.0)

## 2022-08-12 LAB — MAGNESIUM: Magnesium: 2 mg/dL (ref 1.7–2.4)

## 2022-08-12 NOTE — Progress Notes (Signed)
  Progress Note   Patient: Alexandra [name] [id number] DOB: [birth year] DOA: 08/03/2022     9 DOS: the patient was seen and examined on 08/12/2022   Brief hospital course:  Assessment and Plan: Sepsis secondary to acute right pyelonephritis POA Enterococcus faecalis bacteremia - Amoxicillin 1000 mg PO q12  - s/p TEE on 08/10/2022 (no endocarditis noted)   Acute hyperkalemia AKI on CKD 4 Anion gap metabolic acidosis - Sodium bicarb  650 mg PO tid    Obstructive uropathy Right hydronephrosis with stent in place - 2/24, underwent cystoscopy and right ureteral stent placement.  Per operative report, there is no suspicious lesions, masses or stones.   Hypotension  H/o hypertension - Lopressor 12.5 mg PO bid   Acute on chronic anemia - Ferrous sulfate 325 mg PO daily     COPD Chronic respiratory failure with hypoxia Past history of smoking - Breo ellipta 1 puff daily  - Incruse ellipta 1 puff daily  - Xopenex q6 hr PRN    GERD - Protonix 40 mg PO daily     Hyperlipidemia - Lipitor 40 mg PO daily     Restless leg syndrome - Requip 2 mg PO bid    DVT prophylaxis: Heparin 5000 units sq q12       Subjective: Pt seen and examined at the bedside. WBC down-trending 13.6 -->12.4.Crp slowly  uptrending. Mag is wnl today after correction yesterday. She will likely be ready for discharge in the next 1-2 days. Making good progress with mobility specialist.   Physical Exam: Vitals:   08/12/22 0501 08/12/22 0734 08/12/22 0834 08/12/22 1200  BP: 136/63 134/61  (!) 121/53  Pulse: 90 93  85  Resp: (!) 28 (!) 24  18  Temp: 98.3 F (36.8 C) 97.9 F (36.6 C)  98.1 F (36.7 C)  TempSrc: Oral Oral  Oral  SpO2: 97% 97% 98% 99%  Weight:      Height:       Constitutional:      Appearance: Normal appearance.  HENT:     Head: Normocephalic and atraumatic.     Mouth/Throat:     Mouth: Mucous membranes are moist.  Cardiovascular:     Rate and Rhythm: Normal rate and regular  rhythm.  Pulmonary:     Effort: Pulmonary effort is normal.  Abdominal:     Palpations: Abdomen is soft.  Musculoskeletal:        General: Normal range of motion.     Cervical back: Neck supple.  Skin:    General: Skin is warm.  Neurological:     Mental Status: She is alert. Mental status is at baseline.  Psychiatric:        Mood and Affect: Mood normal.   Data Reviewed:   Disposition: Status is: Inpatient  Planned Discharge Destination: Home    Time spent: 35 minutes  Author: Lucienne Minks , MD 08/12/2022 12:36 PM  For on call review [email or web address].

## 2022-08-12 NOTE — Progress Notes (Signed)
Mobility Specialist Progress Note:   08/12/22 0941  Mobility  Activity Ambulated with assistance in hallway  Level of Assistance Modified independent, requires aide device or extra time  Assistive Device Four wheel walker  Distance Ambulated (ft) 200 ft  Activity Response Tolerated well  $Mobility charge 1 Mobility   Pt in chair willing to participate in mobility. No complaints of pain. Left in chair with call bell in reach and all needs met.   Gareth Eagle Renada Cronin Mobility Specialist Please contact via [employer] or  Rehab Office at [phone]

## 2022-08-13 ENCOUNTER — Encounter (HOSPITAL_COMMUNITY): Payer: Self-pay | Admitting: Internal Medicine

## 2022-08-13 ENCOUNTER — Other Ambulatory Visit (HOSPITAL_COMMUNITY): Payer: Self-pay

## 2022-08-13 LAB — CBC
HCT: 23 % — ABNORMAL LOW (ref 36.0–46.0)
Hemoglobin: 7.4 g/dL — ABNORMAL LOW (ref 12.0–15.0)
MCH: 31 pg (ref 26.0–34.0)
MCHC: 32.2 g/dL (ref 30.0–36.0)
MCV: 96.2 fL (ref 80.0–100.0)
Platelets: 222 10*3/uL (ref 150–400)
RBC: 2.39 MIL/uL — ABNORMAL LOW (ref 3.87–5.11)
RDW: 18.9 % — ABNORMAL HIGH (ref 11.5–15.5)
WBC: 10.3 10*3/uL (ref 4.0–10.5)
nRBC: 0 % (ref 0.0–0.2)

## 2022-08-13 LAB — C-REACTIVE PROTEIN: CRP: 5 mg/dL — ABNORMAL HIGH (ref <1.0)

## 2022-08-13 LAB — COMPREHENSIVE METABOLIC PANEL
ALT: <5 U/L (ref 0–44)
AST: 14 U/L — ABNORMAL LOW (ref 15–41)
Albumin: 2.1 g/dL — ABNORMAL LOW (ref 3.5–5.0)
Alkaline Phosphatase: 49 U/L (ref 38–126)
Anion gap: 9 (ref 5–15)
BUN: 44 mg/dL — ABNORMAL HIGH (ref 8–23)
CO2: 21 mmol/L — ABNORMAL LOW (ref 22–32)
Calcium: 7.7 mg/dL — ABNORMAL LOW (ref 8.9–10.3)
Chloride: 105 mmol/L (ref 98–111)
Creatinine, Ser: 3.5 mg/dL — ABNORMAL HIGH (ref 0.44–1.00)
GFR, Estimated: 13 mL/min — ABNORMAL LOW (ref >60)
Glucose, Bld: 100 mg/dL — ABNORMAL HIGH (ref 70–99)
Potassium: 4.2 mmol/L (ref 3.5–5.1)
Sodium: 135 mmol/L (ref 135–145)
Total Bilirubin: 0.4 mg/dL (ref 0.3–1.2)
Total Protein: 4.9 g/dL — ABNORMAL LOW (ref 6.5–8.1)

## 2022-08-13 LAB — PHOSPHORUS: Phosphorus: 4 mg/dL (ref 2.5–4.6)

## 2022-08-13 LAB — MAGNESIUM: Magnesium: 1.9 mg/dL (ref 1.7–2.4)

## 2022-08-13 MED ORDER — FERROUS SULFATE 325 (65 FE) MG PO TABS
325.0000 mg | ORAL_TABLET | Freq: Every day | ORAL | 0 refills | Status: DC
  Filled 2022-08-13: qty 90, 90d supply, fill #0

## 2022-08-13 MED ORDER — SODIUM BICARBONATE 650 MG PO TABS
650.0000 mg | ORAL_TABLET | Freq: Three times a day (TID) | ORAL | 0 refills | Status: AC
  Filled 2022-08-13: qty 90, 30d supply, fill #0

## 2022-08-13 MED ORDER — PANTOPRAZOLE SODIUM 40 MG PO TBEC
40.0000 mg | DELAYED_RELEASE_TABLET | Freq: Every day | ORAL | 0 refills | Status: DC
  Filled 2022-08-13: qty 30, 30d supply, fill #0

## 2022-08-13 NOTE — Progress Notes (Signed)
Mobility Specialist Progress Note:   08/13/22 0926  Mobility  Activity Refused mobility   Pt refusing d/t R hip pain. Will follow-up as time allows.   Gareth Eagle Heylee Tant Mobility Specialist Please contact via [employer] or  Rehab Office at [phone]

## 2022-08-13 NOTE — TOC Transition Note (Signed)
Transition of Care [facility]) - CM/SW Discharge Note   Patient Details  Name: Alexandra [name] MRN: [id number] Date of Birth: 1940/07/30  [facility]) CM/SW Contact:  Cyndi Bender, RN Phone Number: 08/13/2022, 11:38 AM   Clinical Narrative:    Patient stable for discharge.  Daughter will transport home. Portable oxygen tank ordered from [facility]. No other toc needs at this time.    Final next level of care: Home/Self Care Barriers to Discharge: Barriers Resolved   Patient Goals and CMS Choice  Return hone    Discharge Placement  home                       Discharge Plan and Services Additional resources added to the After Visit Summary for                  DME Arranged: Oxygen DME Agency:  ([facility]) Date DME Agency Contacted: 08/13/22 Time DME Agency Contacted: [id number] Representative spoke with at DME Agency: [place] ([place]) Interventions [place]: No Food Insecurity (08/04/2022)  Housing: Low Risk  (08/04/2022)  Transportation Needs: No Transportation Needs (08/04/2022)  Utilities: Not At Risk (08/04/2022)  Alcohol Screen: Low Risk  (05/30/2022)  Depression (PHQ2-9): Low Risk  (05/30/2022)  Financial Resource Strain: Medium Risk (07/25/2022)  Physical Activity: Inactive (07/25/2022)  Social Connections: Moderately Integrated (02/15/2022)  Stress: Stress Concern Present (07/25/2022)  Tobacco Use: Medium Risk (08/10/2022)     Readmission Risk Interventions    08/13/2022   11:38 AM  Readmission Risk Prevention Plan  Transportation Screening Complete  PCP or Specialist Appt within 3-5 Days Complete  HRI or [place] Complete  Social Work Consult for [place] Planning/Counseling Complete  Palliative Care Screening Not Applicable  Medication Review Press photographer) Complete

## 2022-08-13 NOTE — Progress Notes (Signed)
Nutrition Follow-up  DOCUMENTATION CODES:   Not applicable  INTERVENTION:  - Continue Regular diet, Thin Liquids.   NUTRITION DIAGNOSIS:   Increased nutrient needs related to acute illness (pyelonephritis, bacteremia) as evidenced by estimated needs.  GOAL:   Patient will meet greater than or equal to 90% of their needs - Met, continue as goal.   MONITOR:   PO intake, Supplement acceptance, Labs, Weight trends, I & O's  REASON FOR ASSESSMENT:   Malnutrition Screening Tool    ASSESSMENT:   82 year old female who presented to the ED on 2/23 with hyperkalemia. PMH of CKD stage IV, COPD, HLD, HTN, renal cell carcinoma s/p L total nephrectomy in 1999, anemia of chronic kidney disease, GERD, bladder cancer, osteoporosis. Pt admitted with AKI on CKD, sepsis secondary to acute R pyelonephritis and Enterococcus faecalis bacteremia.  Meds reviewed: lipitor, ferrous sulfate, sodium bicarbonate. Labs reviewed: BUN/Creatinine.   Pt continues with good PO intakes. Per record, pt has been eating 100% of her meals since admission. RD will continue to monitor PO intakes. Pt planning to discharge today.   Diet Order:   Diet Order             Diet regular Room service appropriate? Yes; Fluid consistency: Thin  Diet effective now                   EDUCATION NEEDS:   No education needs have been identified at this time  Skin:  Skin Assessment: Reviewed RN Assessment  Last BM:  08/12/22  Height:   Ht Readings from Last 1 Encounters:  08/10/22 '5\' 2"'$  (1.575 m)    Weight:   Wt Readings from Last 1 Encounters:  08/13/22 72.3 kg    Ideal Body Weight:     BMI:  Body mass index is 29.15 kg/m.  Estimated Nutritional Needs:   Kcal:  2000-2200  Protein:  90-105 grams  Fluid:  >2.0 L  Thalia Bloodgood, RD, LDN, CNSC.

## 2022-08-13 NOTE — Discharge Summary (Signed)
Physician Discharge Summary  Alexandra [name] [id number] DOB: 10/14/1940 DOA: 08/03/2022  PCP: Rochel Brome, MD  Admit date: 08/03/2022 Discharge date: 08/14/2022  Admitted From: Home  Discharge disposition: Home with home health   Recommendations for Outpatient Follow-Up:   Follow up with your primary care provider in one week.  Check CBC, BMP, magnesium in the next visit Follow-up with infectious disease Dr. Candiss Norse on 08/27/2022. Follow-up with nephrology and urology as outpatient.  Urology recommends follow-up in 6 weeks to set up next stent exchange.  Discharge Diagnosis:   Principal Problem:   Acute renal failure superimposed on stage 4 chronic kidney disease (HCC) Active Problems:   Hyperlipidemia   History of anemia due to chronic kidney disease   Hyperkalemia   GERD (gastroesophageal reflux disease)   Restless leg syndrome   High anion gap metabolic acidosis   SIRS (systemic inflammatory response syndrome) (HCC)   Leukocytosis   History of COPD   Discharge Condition: Improved.  Diet recommendation: Low sodium, heart healthy.    Wound care: None.  Code status: Full.   History of Present Illness:   Alexandra [name] is a 82 y.o. female with PMH significant for hypertension, hyperlipidemia, CKD stage IV, COPD on 2 L of oxygen at home, GERD, restless leg syndrome, hyperparathyroidism,  RCC s/p left total nephrectomy 1999, anemia chronic disease was sent to the ED with elevated creatinine and potassium level from outpatient labs on 07/31/2022.  Patient follows up with nephrologist Dr. Hollie Salk as outpatient.  In the ED patient was hemodynamically stable.  Labs showed sodium 132, potassium 6.7, BUN/creatinine 80/6.84, WC count elevated to 24,000, hemoglobin low at 9.2.  Urine showed moderate hemoglobin.  Renal ultrasound with right sided ureteric stent with hydronephrosis and proximal hydroureter.  Renal study showed stent malfunction with possible pyelonephritis.  Urology  was consulted including nephrology.  Patient initially received temporizing measures for hyperkalemia and was admitted hospital for further evaluation and treatment. After hospitalization, patient had blood cultures growing Enterococcus faecalis.  TEE without any endocarditis.    Hospital Course:   Following conditions were addressed during hospitalization as listed below,  Sepsis secondary to acute right pyelonephritis POA Enterococcus faecalis bacteremia CT scan of the abdomen was suggestive of acute pyonephritis. Urine culture grew > 100,000 CFU per mL of Enterococcus faecalis.  Blood culture showed Enterococcus faecalis as well.   Right anterior chest wall port removed on 2/28.  TEE on 08/10/2022 without any endocarditis.  Leukocytosis has normalized.  Temperature max of 98.4 F trending down CRP.Patient was seen by ID and currently on amoxicillin and plan is to continue antibiotic until 08/21/2022.  Does have an appointment follow-up with Dr. Candiss Norse ID on 08/27/2022.   Acute hyperkalemia AKI on CKD 4 Anion gap metabolic acidosis Initially received temporizing measures.  Had obstructive uropathy as well with UTI.  Continue sodium bicarb.  Nephrology and urology have seen the patient during hospitalization.  Latest creatinine level at 3.5.  Patient follows up with Dr. Hollie Salk nephrology as outpatient.  Nephrology has signed off at this time.  Obstructive uropathy Right hydronephrosis with stent in place Status post left total nephrectomy Urology was consulted and patient underwent underwent cystoscopy and right ureteral stent placement.  Patient will need to follow-up with urology as outpatient.   Hypotension  H/o hypertension Improved blood pressure at this time.  Currently on metoprolol.   Acute on chronic anemia  Continue iron sulfate.  COPD Chronic respiratory failure with hypoxia Past history of smoking  On 2 L of oxygen at baseline.  Continue Breo Incruse Xopenex.   GERD Continue  Protonix.   Hyperlipidemia Continue Lipitor.   Restless leg syndrome Continue Requip on discharge..    Disposition.  At this time, patient is stable for disposition home with outpatient PCP, nephrology and urology follow-up.  Medical Consultants:   Nephrology,  cardiology,  infectious disease, Radiology Urology  Procedures:    Removal of right chest wall Port-A-Cath on 08/08/2022  cystoscopy and right ureteral stent placement. TEE on 08/10/2022 Subjective:   Today, patient was seen and examined at bedside.  Denies any nausea vomiting fever chills or rigor.  Discharge Exam:   Vitals:   08/13/22 0756 08/13/22 1059  BP: (!) 129/50 (!) 148/67  Pulse: 90 87  Resp: (!) 23 19  Temp: 97.6 F (36.4 C) 97.8 F (36.6 C)  SpO2: 97% 100%   Vitals:   08/12/22 2347 08/13/22 0347 08/13/22 0756 08/13/22 1059  BP: 124/89 (!) 142/64 (!) 129/50 (!) 148/67  Pulse: 90 99 90 87  Resp: (!) 25 16 (!) 23 19  Temp: 98 F (36.7 C) 98.4 F (36.9 C) 97.6 F (36.4 C) 97.8 F (36.6 C)  TempSrc: Oral Oral Oral Oral  SpO2: 98% 97% 97% 100%  Weight:  72.3 kg    Height:        General: Alert awake, not in obvious distress elderly female, HENT: pupils equally reacting to light,  No scleral pallor or icterus noted. Oral mucosa is moist.  Chest:  Clear breath sounds.  Diminished breath sounds bilaterally. No crackles or wheezes.  CVS: S1 &S2 heard. No murmur.  Regular rate and rhythm. Abdomen: Soft, nontender, nondistended.  Bowel sounds are heard.   Extremities: No cyanosis, clubbing or edema.  Peripheral pulses are palpable. Psych: Alert, awake and oriented, normal mood CNS:  No cranial nerve deficits.  Power equal in all extremities.   Skin: Warm and dry.  No rashes noted.  The results of significant diagnostics from this hospitalization (including imaging, microbiology, ancillary and laboratory) are listed below for reference.     Diagnostic Studies:   DG Retrograde  Pyelogram  Result Date: 08/04/2022 CLINICAL DATA:  82 year old female with history of right double-J nephroureteral stent. EXAM: RETROGRADE PYELOGRAM COMPARISON:  CT abdomen pelvis from 08/03/2022 FINDINGS: Single fluoroscopic image demonstrates superior aspect of the indwelling right double-J nephroureteral stent which appears well positioned. IMPRESSION: Single fluoroscopic image demonstrates superior aspect of the indwelling right double-J nephroureteral stent which appears well positioned. Ruthann Cancer, MD [facility] [place] [facility] Electronically Signed   By: Ruthann Cancer M.D.   On: 08/04/2022 16:47   CT Renal Stone Study  Result Date: 08/03/2022 CLINICAL DATA:  Rule out obstructive uropathy. Routine blood work demonstrated high potassium levels. EXAM: CT ABDOMEN AND PELVIS WITHOUT CONTRAST TECHNIQUE: Multidetector CT imaging of the abdomen and pelvis was performed following the standard protocol without IV contrast. RADIATION DOSE REDUCTION: This exam was performed according to the departmental dose-optimization program which includes automated exposure control, adjustment of the mA and/or kV according to patient size and/or use of iterative reconstruction technique. COMPARISON:  07/20/2022 FINDINGS: Lower chest: Trace bilateral pleural effusions with atelectasis in the lung bases. Emphysematous changes in the lungs. Hepatobiliary: Surgical absence of the gallbladder. No focal liver lesions. Scattered calcification in the left lobe of the liver, likely granulomas. Pancreas: Unremarkable. No pancreatic ductal dilatation or surrounding inflammatory changes. Spleen: Normal in size without focal abnormality. Adrenals/Urinary Tract: No adrenal  gland nodules. Surgical absence of the left kidney. Right kidney is rotated. There is a right ureteral stent with proximal pigtail in the renal pelvis and distal pigtail in the bladder. Moderate right renal hydronephrosis,  progressing since prior study. This may indicate stent malfunction. Several cysts in the right kidney. Largest is hyperdense, measuring 3.6 cm diameter. This is unchanged since the prior study and likely represents a hemorrhagic cyst. Stranding around the right ureter may indicate changes due to pyelonephritis or other inflammatory process. Bladder is decompressed, limiting evaluation but there is asymmetric wall thickening of the right bladder wall, unchanged since prior study. Stomach/Bowel: Stomach, small bowel, and colon are not abnormally distended. Residual densities scattered throughout the small bowel and colon are new since prior study, likely representing ingested contrast material or other radiodense material. No evidence of bowel wall thickening or inflammatory stranding. Appendix is not identified. Colonic diverticula without evidence of acute diverticulitis. Vascular/Lymphatic: Calcification of the aorta. No aneurysm. No significant lymphadenopathy. Reproductive: Status post hysterectomy. No adnexal masses. Other: No free air or free fluid in the abdomen. Presacral soft tissue stranding is similar to prior study and may be inflammatory. Mild edema in the subcutaneous fat. Musculoskeletal: Degenerative changes in the spine. Postoperative fixation of the lower spine. IMPRESSION: 1. Interval development of right hydronephrosis despite presence of a ureteral stent. This may represent stent malfunction. Stranding along the right ureter may also indicate infection such as pyelonephritis. 2. Hyperdense right renal cyst is unchanged since prior study, likely hemorrhagic cyst. No imaging follow-up is indicated. 3. Asymmetric wall thickening of the right side of the bladder is unchanged since prior study. Etiology is indeterminate. 4. Aortic atherosclerosis. Electronically Signed   By: Lucienne Capers M.D.   On: 08/03/2022 21:49   US Renal  Result Date: 08/03/2022 CLINICAL DATA:  History of prior left  nephrectomy, presenting with acute kidney injury. EXAM: RENAL / URINARY TRACT ULTRASOUND COMPLETE COMPARISON:  July 23, 2022 FINDINGS: Right Kidney: Renal measurements: 11.2 cm x 6.6 cm x 6.4 cm = volume: 243.2 mL. Diffusely increased echogenicity of the renal parenchyma is noted. A 3.2 cm x 3.5 cm x 2.9 cm complex renal cyst is seen within the upper pole of the right kidney. An adjacent 1.6 cm x 1.5 cm x 1.6 cm simple right renal cyst is noted. These areas are seen on the prior study. There is moderate severity right-sided hydronephrosis which represents a new finding. The proximal portion of a right-sided endo ureteral stent is seen within the proximal right ureter. Left Kidney: The left kidney is surgically absent. Bladder: Poorly distended and subsequently limited in evaluation. The distal portion of a right-sided endo ureteral stent is visualized. Other: None. IMPRESSION: 1. Right-sided endo ureteral stent, as described above, with moderate severity right-sided hydronephrosis and proximal hydroureter. 2. Diffusely increased renal echogenicity which likely represents sequelae associated with acute kidney injury. 3. Complex and simple right renal cysts, as described above. 4. Findings consistent with prior left nephrectomy. Electronically Signed   By: Virgina Norfolk M.D.   On: 08/03/2022 21:34   DG Chest Port 1 View  Result Date: 08/03/2022 CLINICAL DATA:  Leukocytosis EXAM: PORTABLE CHEST 1 VIEW COMPARISON:  07/22/2022 FINDINGS: Stable cardiomediastinal silhouette. Aortic atherosclerotic calcification. Right chest wall Port-A-Cath tip in the low SVC. Bibasilar atelectasis/consolidation. Question small bilateral pleural effusions. No pneumothorax. IMPRESSION: Bibasilar atelectasis/consolidation. Pneumonia is difficult to exclude. Question small bilateral pleural effusions. Electronically Signed   By: Placido Sou M.D.   On: 08/03/2022 21:16  Labs:   Basic Metabolic Panel: Recent Labs  Lab  08/09/22 0009 08/10/22 0030 08/11/22 0056 08/12/22 0032 08/13/22 0019  NA 138 137 136 135 135  K 4.4 4.3 4.1 4.2 4.2  CL 107 106 103 104 105  CO2 18* 21* 22 22 21*  GLUCOSE 125* 98 96 102* 100*  BUN 67* 59* 52* 50* 44*  CREATININE 4.81* 4.25* 3.96* 3.75* 3.50*  CALCIUM 7.7* 7.9* 7.7* 7.8* 7.7*  MG  --   --  1.1* 2.0 1.9  PHOS 3.9 4.1 4.4 4.3 4.0   GFR Estimated Creatinine Clearance: 12.1 mL/min (A) (by C-G formula based on SCr of 3.5 mg/dL (H)). Liver Function Tests: Recent Labs  Lab 08/09/22 0009 08/10/22 0030 08/11/22 0056 08/12/22 0032 08/13/22 0019  AST  --   --  16 14* 14*  ALT  --   --  6 5 <5  ALKPHOS  --   --  46 49 49  BILITOT  --   --  0.7 0.3 0.4  PROT  --   --  4.8* 5.1* 4.9*  ALBUMIN 2.4* 2.2* 2.1* 2.1* 2.1*   No results for input(s): "LIPASE", "AMYLASE" in the last 168 hours. No results for input(s): "AMMONIA" in the last 168 hours. Coagulation profile No results for input(s): "INR", "PROTIME" in the last 168 hours.  CBC: Recent Labs  Lab 08/08/22 0007 08/11/22 0056 08/12/22 0032 08/13/22 0019  WBC 13.4* 13.6* 12.4* 10.3  NEUTROABS 11.1*  --   --   --   HGB 7.9* 7.3* 7.7* 7.4*  HCT 25.7* 22.6* 24.5* 23.0*  MCV 98.5 95.8 97.2 96.2  PLT 248 183 201 222   Cardiac Enzymes: No results for input(s): "CKTOTAL", "CKMB", "CKMBINDEX", "TROPONINI" in the last 168 hours. BNP: Invalid input(s): "POCBNP" CBG: No results for input(s): "GLUCAP" in the last 168 hours. D-Dimer No results for input(s): "DDIMER" in the last 72 hours. Hgb A1c No results for input(s): "HGBA1C" in the last 72 hours. Lipid Profile No results for input(s): "CHOL", "HDL", "LDLCALC", "TRIG", "CHOLHDL", "LDLDIRECT" in the last 72 hours. Thyroid function studies No results for input(s): "TSH", "T4TOTAL", "T3FREE", "THYROIDAB" in the last 72 hours.  Invalid input(s): "FREET3" Anemia work up No results for input(s): "VITAMINB12", "FOLATE", "FERRITIN", "TIBC", "IRON", "RETICCTPCT"  in the last 72 hours. Microbiology Recent Results (from the past 240 hour(s))  Culture, blood (Routine X 2) w Reflex to ID Panel     Status: Abnormal   Collection Time: 08/04/22  8:35 AM   Specimen: BLOOD  Result Value Ref Range Status   Specimen Description BLOOD SITE NOT SPECIFIED  Final   Special Requests   Final    BOTTLES DRAWN AEROBIC AND ANAEROBIC Blood Culture adequate volume   Culture  Setup Time   Final    GRAM POSITIVE COCCI IN BOTH AEROBIC AND ANAEROBIC BOTTLES CRITICAL RESULT CALLED TO, READ BACK BY AND VERIFIED WITH: PHARMD G. ABBOTT 08/05/22 @ 0401 BY AB Performed at [facility], [place] [address] [place], [place] [address]    Culture ENTEROCOCCUS FAECALIS (A)  Final   Report Status 08/07/2022 FINAL  Final   Organism ID, Bacteria ENTEROCOCCUS FAECALIS  Final      Susceptibility   Enterococcus faecalis - MIC*    AMPICILLIN <=2 SENSITIVE Sensitive     VANCOMYCIN 1 SENSITIVE Sensitive     GENTAMICIN SYNERGY RESISTANT Resistant     * ENTEROCOCCUS FAECALIS  Blood Culture ID Panel (Reflexed)     Status: Abnormal  Collection Time: 08/04/22  8:35 AM  Result Value Ref Range Status   Enterococcus faecalis DETECTED (A) NOT DETECTED Final    Comment: CRITICAL RESULT CALLED TO, READ BACK BY AND VERIFIED WITH: PHARMD G. ABBOTT 08/05/22 @ 0401 BY AB    Enterococcus Faecium NOT DETECTED NOT DETECTED Final   Listeria monocytogenes NOT DETECTED NOT DETECTED Final   Staphylococcus species NOT DETECTED NOT DETECTED Final   Staphylococcus aureus (BCID) NOT DETECTED NOT DETECTED Final   Staphylococcus epidermidis NOT DETECTED NOT DETECTED Final   Staphylococcus lugdunensis NOT DETECTED NOT DETECTED Final   Streptococcus species NOT DETECTED NOT DETECTED Final   Streptococcus agalactiae NOT DETECTED NOT DETECTED Final   Streptococcus pneumoniae NOT DETECTED NOT DETECTED Final   Streptococcus pyogenes NOT DETECTED NOT DETECTED Final   A.calcoaceticus-baumannii NOT DETECTED NOT  DETECTED Final   Bacteroides fragilis NOT DETECTED NOT DETECTED Final   Enterobacterales NOT DETECTED NOT DETECTED Final   Enterobacter cloacae complex NOT DETECTED NOT DETECTED Final   Escherichia coli NOT DETECTED NOT DETECTED Final   Klebsiella aerogenes NOT DETECTED NOT DETECTED Final   Klebsiella oxytoca NOT DETECTED NOT DETECTED Final   Klebsiella pneumoniae NOT DETECTED NOT DETECTED Final   Proteus species NOT DETECTED NOT DETECTED Final   Salmonella species NOT DETECTED NOT DETECTED Final   Serratia marcescens NOT DETECTED NOT DETECTED Final   Haemophilus influenzae NOT DETECTED NOT DETECTED Final   Neisseria meningitidis NOT DETECTED NOT DETECTED Final   Pseudomonas aeruginosa NOT DETECTED NOT DETECTED Final   Stenotrophomonas maltophilia NOT DETECTED NOT DETECTED Final   Candida albicans NOT DETECTED NOT DETECTED Final   Candida auris NOT DETECTED NOT DETECTED Final   Candida glabrata NOT DETECTED NOT DETECTED Final   Candida krusei NOT DETECTED NOT DETECTED Final   Candida parapsilosis NOT DETECTED NOT DETECTED Final   Candida tropicalis NOT DETECTED NOT DETECTED Final   Cryptococcus neoformans/gattii NOT DETECTED NOT DETECTED Final   Vancomycin resistance NOT DETECTED NOT DETECTED Final    Comment: Performed at [facility], [address] [place], [place] [address]  Culture, blood (Routine X 2) w Reflex to ID Panel     Status: Abnormal   Collection Time: 08/04/22  9:49 AM   Specimen: BLOOD  Result Value Ref Range Status   Specimen Description BLOOD SITE NOT SPECIFIED  Final   Special Requests   Final    BOTTLES DRAWN AEROBIC AND ANAEROBIC Blood Culture adequate volume   Culture  Setup Time   Final    GRAM POSITIVE COCCI IN BOTH AEROBIC AND ANAEROBIC BOTTLES CRITICAL VALUE NOTED.  VALUE IS CONSISTENT WITH PREVIOUSLY REPORTED AND CALLED VALUE.    Culture (A)  Final    ENTEROCOCCUS FAECALIS SUSCEPTIBILITIES PERFORMED ON PREVIOUS CULTURE WITHIN THE LAST 5  DAYS. Performed at [facility], [place] [address] [place], [place] [address]    Report Status 08/07/2022 FINAL  Final  Urine Culture     Status: Abnormal   Collection Time: 08/04/22  3:11 PM   Specimen: Urine, Catheterized  Result Value Ref Range Status   Specimen Description URINE, CATHETERIZED  Final   Special Requests   Final    NONE Performed at [facility], [place] [address], [place], [place] [address]    Culture >=100,000 COLONIES/mL ENTEROCOCCUS FAECALIS (A)  Final   Report Status 08/06/2022 FINAL  Final   Organism ID, Bacteria ENTEROCOCCUS FAECALIS (A)  Final      Susceptibility   Enterococcus  faecalis - MIC*    AMPICILLIN <=2 SENSITIVE Sensitive     NITROFURANTOIN <=16 SENSITIVE Sensitive     VANCOMYCIN 1 SENSITIVE Sensitive     * >=100,000 COLONIES/mL ENTEROCOCCUS FAECALIS  MRSA Next Gen by PCR, Nasal     Status: None   Collection Time: 08/04/22  6:23 PM   Specimen: Nasal Mucosa; Nasal Swab  Result Value Ref Range Status   MRSA by PCR Next Gen NOT DETECTED NOT DETECTED Final    Comment: (NOTE) The GeneXpert MRSA Assay (FDA approved for NASAL specimens only), is one component of a comprehensive MRSA colonization surveillance program. It is not intended to diagnose MRSA infection nor to guide or monitor treatment for MRSA infections. Test performance is not FDA approved in patients less than 66 years old. Performed at [facility], [place] [address], [place], [place] [address]   Culture, blood (Routine X 2) w Reflex to ID Panel     Status: None   Collection Time: 08/05/22 11:49 AM   Specimen: BLOOD  Result Value Ref Range Status   Specimen Description BLOOD THUMB LEFT  Final   Special Requests   Final    BOTTLES DRAWN AEROBIC ONLY Blood Culture adequate volume   Culture   Final    NO GROWTH 5 DAYS Performed at [facility], [address] [place], [place] [address]    Report Status 08/10/2022 FINAL  Final  Culture, blood (Routine X 2) w  Reflex to ID Panel     Status: None   Collection Time: 08/05/22 12:58 PM   Specimen: BLOOD LEFT HAND  Result Value Ref Range Status   Specimen Description BLOOD LEFT HAND  Final   Special Requests   Final    BOTTLES DRAWN AEROBIC AND ANAEROBIC Blood Culture adequate volume   Culture   Final    NO GROWTH 5 DAYS Performed at [facility], [place] [address], [place], [place] [address]    Report Status 08/10/2022 FINAL  Final     Discharge Instructions:   Discharge Instructions     Call MD for:  temperature >100.4   Complete by: As directed    Diet general   Complete by: As directed    Discharge instructions   Complete by: As directed    Follow-up with your primary care provider in 1 week.  Check blood work at that time.  Follow-up with Dr. Hollie Salk nephrology in 1 to 2 weeks.  Follow-up with urology as outpatient for stent follow-up.  Follow-up with infectious disease as has been is scheduled.  Seek medical attention for worsening symptoms.  Complete the course of antibiotic.   Increase activity slowly   Complete by: As directed    No wound care   Complete by: As directed       Allergies as of 08/13/2022       Reactions   Contrast Media [iodinated Contrast Media] Hives, Itching, Rash   Omeprazole Itching   Shellfish Allergy Nausea And Vomiting   Shellfish-derived Products Nausea And Vomiting        Medication List     TAKE these medications    amoxicillin 500 MG capsule Commonly known as: AMOXIL Take 2 capsules (1,000 mg total) by mouth 2 (two) times daily for 24 doses.   atorvastatin 40 MG tablet Commonly known as: LIPITOR Take 1 tablet (40 mg total) by mouth daily. Per ccm   Breztri Aerosphere 160-9-4.8 MCG/ACT Aero Generic drug: Budeson-Glycopyrrol-Formoterol Inhale 2 puffs  into the lungs in the morning and at bedtime.   budesonide 0.5 MG/2ML nebulizer solution Commonly known as: PULMICORT Take 0.5 mg by nebulization 2 (two) times daily.   FeroSul 325  (65 FE) MG tablet Generic drug: ferrous sulfate Take 1 tablet (325 mg total) by mouth daily. What changed:  medication strength how much to take   ipratropium 0.02 % nebulizer solution Commonly known as: ATROVENT Take 0.5 mg by nebulization 2 (two) times daily.   levalbuterol 1.25 MG/3ML nebulizer solution Commonly known as: XOPENEX Take 1.25 mg by nebulization 2 (two) times daily.   metoprolol succinate 25 MG 24 hr tablet Commonly known as: TOPROL-XL Take 25 mg by mouth daily.   nitroGLYCERIN 0.4 MG SL tablet Commonly known as: NITROSTAT Place 1 tablet (0.4 mg total) under the tongue every 5 (five) minutes as needed for chest pain.   ondansetron 4 MG disintegrating tablet Commonly known as: ZOFRAN-ODT Take 1 tablet (4 mg total) by mouth every 8 (eight) hours as needed for nausea or vomiting.   pantoprazole 40 MG tablet Commonly known as: PROTONIX Take 1 tablet (40 mg total) by mouth daily.   polyethylene glycol powder 17 GM/SCOOP powder Commonly known as: GLYCOLAX/MIRALAX Take 17 g by mouth daily as needed for mild constipation.   rOPINIRole 2 MG tablet Commonly known as: REQUIP TAKE 1 TABLET(2 MG) BY MOUTH IN THE MORNING AND AT BEDTIME What changed: See the new instructions.   sodium bicarbonate 650 MG tablet Take 1 tablet (650 mg total) by mouth 3 (three) times daily.   torsemide 20 MG tablet Commonly known as: DEMADEX Take 20 mg by mouth daily as needed (edema).   Ventolin HFA 108 (90 Base) MCG/ACT inhaler Generic drug: albuterol Inhale 1-2 puffs into the lungs every 6 (six) hours as needed for wheezing or shortness of breath.   Vitamin D 125 MCG (5000 UT) Caps Take 1 capsule by mouth daily.        Follow-up Information     Vira Agar, MD. Go on 09/24/2022.   Specialty: Urology Why: September 24, 2022 at [facility] information: [place] [place] [address] [phone]         Rochel Brome, MD Follow up in 1 week(s).    Specialty: Family Medicine Why: office closed on 08/13/22, please call on 08/14/22 Contact information: [address] [place] [address] [phone]         Laurice Record, MD. Go on 08/23/2022.   Specialty: Infectious Diseases Contact information: [address], [place] [address] [phone]         Madelon Lips, MD. Go on 08/17/2022.   Specialty: Nephrology Why: renal failure followup on 08/17/22 at [place] information: [place] [address] [phone]                  Time coordinating discharge: 39 minutes  Signed:  Jenicka Coxe  [facility] 08/14/2022, 8:30 AM

## 2022-08-13 NOTE — Hospital Course (Addendum)
   Assessment and plan.

## 2022-08-13 NOTE — Progress Notes (Signed)
Patient provided with verbal discharge instructions. Paper copy of discharge provided to patient. RN answered all questions. VSS at discharge. IV removed. Patient belongings sent with patient. TOC medications brought to beside.  Patient dc'd via wheelchair to private vehicle.

## 2022-08-13 NOTE — Plan of Care (Signed)

## 2022-08-14 ENCOUNTER — Encounter: Payer: Self-pay | Admitting: *Deleted

## 2022-08-14 ENCOUNTER — Telehealth: Payer: Self-pay | Admitting: *Deleted

## 2022-08-14 NOTE — Transitions of Care (Post Inpatient/ED Visit) (Signed)
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 How have you been since you were released from the hospital?: Better Any questions or concerns?: No  Items Reviewed: Did you receive and understand the discharge instructions provided?: Yes (thoroughly reviewed with patient today who verbalizes fairly good understanding of same-- patient was not aware of several appointments that she had scheduled: was updated accordingly) Medications obtained and verified?: Yes (Medications Reviewed) (full medication review completed; patient reports has NO nebulizer medications-- notified CCM Pharmacist currently active in patient's care; no other discrepancies/ concerns noted, patient self manages medications, confirms taking newly Rx'd antibiotic) Any new allergies since your discharge?: No Dietary orders reviewed?: Yes Type of Diet Ordered:: "Kidney diet as much as I can" Do you have support at home?: Yes People in Home: spouse Name of Support/Comfort Primary Source: resides with spouse; local adult children assist with care needs as indicated; patient reports she is essentially independent in self-care activities and denies need for "very much" assistance  Home Care and Equipment/Supplies: [place] Ordered?: No Any new equipment or medical supplies ordered?: No  Functional Questionnaire: Do you need assistance with bathing/showering or dressing?: No Do you need assistance with meal preparation?: No Do you need assistance with eating?: No Do you have difficulty maintaining  continence: No Do you need assistance with getting out of bed/getting out of a chair/moving?: No Do you have difficulty managing or taking your medications?: No  Folllow up appointments reviewed: PCP Follow-up appointment confirmed?: No (notified [facility] clinical pool per practice request that [facility] call completed and patient does not currently have PCP hospital follow up visit scheduled) MD Provider Line [phone] Given: No (confirms she is fully established with [facility]) [facility] Follow-up appointment confirmed?: Yes Date of Specialist follow-up appointment?: 08/20/22 Follow-Up Specialty Provider:: nephrology provider: 08/20/22 at 4:20 pm; ECHOcardiogram scheduled 08/20/22 with cards at 07:30 am; ID specialist on 08/27/22 at 10:30 am-- patient was unaware of all scheduled appointments except for renal appointment on 08/20/22 Do you need transportation to your follow-up appointment?: No Do you understand care options if your condition(s) worsen?: Yes-patient verbalized understanding  SDOH Interventions Today    Flowsheet Row Most Recent Value  SDOH Interventions   Food Insecurity Interventions Intervention Not Indicated  Transportation Interventions Intervention Not Indicated  [today, patient reports her adult local children provide transportation]      TOC Interventions Today    Flowsheet Row Most Recent Value  TOC Interventions   TOC Interventions Discussed/Reviewed TOC Interventions Discussed  [notified PCP Clinical Office pool as per practice request to facilitate scheduling of hospital follow up office visit with PCP]      Interventions Today    Flowsheet Row Most Recent Value  Chronic Disease   Chronic disease during today's visit Chronic Kidney Disease/End Stage Renal Disease (ESRD)  General Interventions   General Interventions Discussed/Reviewed General Interventions Discussed, Doctor Visits, Referral to Nurse  [noted patient currently active with CCM RN CM:  care coordination outreach completed with CCM RN CM as FYI- completion of TOC call,  care coordination outreach to established CCM Pharmacist re: possible med needs- for nebulizer]  Doctor Visits Discussed/Reviewed Doctor Visits Discussed, PCP, Specialist  PCP/Specialist Visits Compliance with follow-up visit  Education Interventions   Education Provided Provided Education  [benefits of taking pro-biotics in setting of prolonged antibiotic therapy]  Provided Verbal Education On When to see the doctor, Medication  [Patient required extensive education/ time around medications and scheduled appointments, appears unclear around these today]  Nutrition Interventions   Nutrition Discussed/Reviewed Nutrition Discussed  Pharmacy Interventions   Pharmacy Dicussed/Reviewed Pharmacy Topics Discussed  [full medication review completed and CCM Pharmacist and RN CM currently active in patient's care notified as FYI]      Oneta Rack, RN, BSN, CCRN Alumnus RN CM Care Coordination/ Transition of [place] Management [phone]: direct office

## 2022-08-15 DIAGNOSIS — I132 Hypertensive heart and chronic kidney disease with heart failure and with stage 5 chronic kidney disease, or end stage renal disease: Secondary | ICD-10-CM | POA: Diagnosis not present

## 2022-08-15 DIAGNOSIS — A419 Sepsis, unspecified organism: Secondary | ICD-10-CM | POA: Diagnosis not present

## 2022-08-15 DIAGNOSIS — R6520 Severe sepsis without septic shock: Secondary | ICD-10-CM | POA: Diagnosis not present

## 2022-08-15 DIAGNOSIS — D631 Anemia in chronic kidney disease: Secondary | ICD-10-CM | POA: Diagnosis not present

## 2022-08-15 DIAGNOSIS — N185 Chronic kidney disease, stage 5: Secondary | ICD-10-CM | POA: Diagnosis not present

## 2022-08-15 DIAGNOSIS — I5033 Acute on chronic diastolic (congestive) heart failure: Secondary | ICD-10-CM | POA: Diagnosis not present

## 2022-08-16 ENCOUNTER — Telehealth: Payer: Self-pay

## 2022-08-16 DIAGNOSIS — D631 Anemia in chronic kidney disease: Secondary | ICD-10-CM | POA: Diagnosis not present

## 2022-08-16 DIAGNOSIS — R6520 Severe sepsis without septic shock: Secondary | ICD-10-CM | POA: Diagnosis not present

## 2022-08-16 DIAGNOSIS — I132 Hypertensive heart and chronic kidney disease with heart failure and with stage 5 chronic kidney disease, or end stage renal disease: Secondary | ICD-10-CM | POA: Diagnosis not present

## 2022-08-16 DIAGNOSIS — A419 Sepsis, unspecified organism: Secondary | ICD-10-CM | POA: Diagnosis not present

## 2022-08-16 DIAGNOSIS — I5033 Acute on chronic diastolic (congestive) heart failure: Secondary | ICD-10-CM | POA: Diagnosis not present

## 2022-08-16 DIAGNOSIS — N185 Chronic kidney disease, stage 5: Secondary | ICD-10-CM | POA: Diagnosis not present

## 2022-08-16 NOTE — Telephone Encounter (Signed)
Spoke with patient who said she can't afford her nebulizers and Breztri. Called her pharmacy and spoke with [place] who said Levalbuterol was $11. The Ipratroprium was $41 but [facility] Cornelia said it wasn't run through insurance. I asked if she could try running it through insurance but [place] stated there were no refills left.  Called patient back and asked her to call me before she picks up her meds from Pharmacy next time. Gave her my phone number.  Will re-do Breztri PAP. This was done last year but her and her husband wanted to hold off on it for some reason (Per notes).

## 2022-08-20 ENCOUNTER — Ambulatory Visit: Payer: Medicare Other | Attending: Cardiology

## 2022-08-20 DIAGNOSIS — N185 Chronic kidney disease, stage 5: Secondary | ICD-10-CM

## 2022-08-20 DIAGNOSIS — Z905 Acquired absence of kidney: Secondary | ICD-10-CM | POA: Diagnosis not present

## 2022-08-20 DIAGNOSIS — J90 Pleural effusion, not elsewhere classified: Secondary | ICD-10-CM | POA: Diagnosis not present

## 2022-08-20 DIAGNOSIS — I5032 Chronic diastolic (congestive) heart failure: Secondary | ICD-10-CM | POA: Insufficient documentation

## 2022-08-20 DIAGNOSIS — D631 Anemia in chronic kidney disease: Secondary | ICD-10-CM

## 2022-08-20 DIAGNOSIS — I110 Hypertensive heart disease with heart failure: Secondary | ICD-10-CM | POA: Diagnosis not present

## 2022-08-20 DIAGNOSIS — I129 Hypertensive chronic kidney disease with stage 1 through stage 4 chronic kidney disease, or unspecified chronic kidney disease: Secondary | ICD-10-CM | POA: Diagnosis not present

## 2022-08-20 DIAGNOSIS — Z85528 Personal history of other malignant neoplasm of kidney: Secondary | ICD-10-CM | POA: Diagnosis not present

## 2022-08-20 DIAGNOSIS — C679 Malignant neoplasm of bladder, unspecified: Secondary | ICD-10-CM | POA: Diagnosis not present

## 2022-08-20 DIAGNOSIS — I3139 Other pericardial effusion (noninflammatory): Secondary | ICD-10-CM | POA: Diagnosis not present

## 2022-08-20 DIAGNOSIS — J449 Chronic obstructive pulmonary disease, unspecified: Secondary | ICD-10-CM | POA: Diagnosis not present

## 2022-08-20 DIAGNOSIS — N184 Chronic kidney disease, stage 4 (severe): Secondary | ICD-10-CM | POA: Diagnosis not present

## 2022-08-20 DIAGNOSIS — N189 Chronic kidney disease, unspecified: Secondary | ICD-10-CM | POA: Diagnosis not present

## 2022-08-20 LAB — ECHOCARDIOGRAM COMPLETE
Area-P 1/2: 4.31 cm2
Calc EF: 48.6 %
MV M vel: 5.68 m/s
MV Peak grad: 129 mmHg
Radius: 0.6 cm
S' Lateral: 3.5 cm
Single Plane A2C EF: 49.4 %
Single Plane A4C EF: 48.5 %

## 2022-08-21 ENCOUNTER — Telehealth: Payer: Self-pay

## 2022-08-21 DIAGNOSIS — D631 Anemia in chronic kidney disease: Secondary | ICD-10-CM | POA: Diagnosis not present

## 2022-08-21 DIAGNOSIS — N185 Chronic kidney disease, stage 5: Secondary | ICD-10-CM | POA: Diagnosis not present

## 2022-08-21 DIAGNOSIS — I132 Hypertensive heart and chronic kidney disease with heart failure and with stage 5 chronic kidney disease, or end stage renal disease: Secondary | ICD-10-CM | POA: Diagnosis not present

## 2022-08-21 DIAGNOSIS — A419 Sepsis, unspecified organism: Secondary | ICD-10-CM | POA: Diagnosis not present

## 2022-08-21 DIAGNOSIS — R6520 Severe sepsis without septic shock: Secondary | ICD-10-CM | POA: Diagnosis not present

## 2022-08-21 DIAGNOSIS — I5033 Acute on chronic diastolic (congestive) heart failure: Secondary | ICD-10-CM | POA: Diagnosis not present

## 2022-08-21 NOTE — Telephone Encounter (Signed)
Elmyra Ricks from [facility] called and stated that she went out to see patient today and the patient reported that she had fallen last night around 8pm and fell and hit her right knee . Nurse stated there was bruising present and skin tear present. She wanted to report the fall and get verbal orders to dress the knee and skin tear. Verbal orders were given to her to give the ok.

## 2022-08-22 ENCOUNTER — Inpatient Hospital Stay: Payer: Medicare Other | Admitting: Family Medicine

## 2022-08-22 DIAGNOSIS — N185 Chronic kidney disease, stage 5: Secondary | ICD-10-CM | POA: Diagnosis not present

## 2022-08-22 DIAGNOSIS — R6520 Severe sepsis without septic shock: Secondary | ICD-10-CM | POA: Diagnosis not present

## 2022-08-22 DIAGNOSIS — A419 Sepsis, unspecified organism: Secondary | ICD-10-CM | POA: Diagnosis not present

## 2022-08-22 DIAGNOSIS — I5033 Acute on chronic diastolic (congestive) heart failure: Secondary | ICD-10-CM | POA: Diagnosis not present

## 2022-08-22 DIAGNOSIS — D631 Anemia in chronic kidney disease: Secondary | ICD-10-CM | POA: Diagnosis not present

## 2022-08-22 DIAGNOSIS — I132 Hypertensive heart and chronic kidney disease with heart failure and with stage 5 chronic kidney disease, or end stage renal disease: Secondary | ICD-10-CM | POA: Diagnosis not present

## 2022-08-23 ENCOUNTER — Encounter: Payer: Self-pay | Admitting: *Deleted

## 2022-08-23 ENCOUNTER — Telehealth: Payer: Self-pay

## 2022-08-23 ENCOUNTER — Ambulatory Visit: Payer: Self-pay | Admitting: *Deleted

## 2022-08-23 DIAGNOSIS — D631 Anemia in chronic kidney disease: Secondary | ICD-10-CM | POA: Diagnosis not present

## 2022-08-23 DIAGNOSIS — R6520 Severe sepsis without septic shock: Secondary | ICD-10-CM | POA: Diagnosis not present

## 2022-08-23 DIAGNOSIS — I132 Hypertensive heart and chronic kidney disease with heart failure and with stage 5 chronic kidney disease, or end stage renal disease: Secondary | ICD-10-CM | POA: Diagnosis not present

## 2022-08-23 DIAGNOSIS — I5033 Acute on chronic diastolic (congestive) heart failure: Secondary | ICD-10-CM | POA: Diagnosis not present

## 2022-08-23 DIAGNOSIS — A419 Sepsis, unspecified organism: Secondary | ICD-10-CM | POA: Diagnosis not present

## 2022-08-23 DIAGNOSIS — N185 Chronic kidney disease, stage 5: Secondary | ICD-10-CM | POA: Diagnosis not present

## 2022-08-23 NOTE — Patient Outreach (Signed)
  Care Coordination   Multidisciplinary Case Review Note    08/23/2022 Name: CELLIE DARDIS MRN: [id number] DOB: [birth year]  RHYLEN PULIDO is a 82 y.o. year old female who sees [facility], MD for primary care.  The  multidisciplinary care team met today to review patient care needs and barriers.    SDOH assessments and interventions completed:  No Previously completed during TOC call on 08/14/22; Multidisciplinary case conference completed 08/23/22   Care Coordination Interventions Activated:  Yes   Care Coordination Interventions:  Yes, provided   Follow up plan:  confirmed patient active with CCM team at [facility]    Multidisciplinary Team Attendees:   Reginia Naas, RN CM TOC; Tomasa Rand, RN CM Care Coordinator; Eduard Clos, LCSW  Scribe for Multidisciplinary Case Review:  N/A  Oneta Rack, RN, BSN, CCRN Alumnus RN CM Care Coordination/ Transition of [place] Management [phone]: direct office

## 2022-08-23 NOTE — Progress Notes (Signed)
Care Management & Coordination Services Pharmacy Team  Reason for Encounter: Hypertension  Contacted patient to discuss hypertension disease state. Unsuccessful outreach. Left voicemail for patient to return call.    Current antihypertensive regimen:  Nitroglycerin 0.4mg   Star Rating Drugs:  Medication:  Last Fill: Day Supply   Chart Updates: Recent office visits:  08/01/22 Alexandra Ohara MD. Telephone message.Potassium is too high. Recommends kayexalate 15 mg qid wed-thurs-fri then to come Fri a.m. for stat recheck of potassium.   07/31/22 Alexandra Ohara MD. Seen for hospital follow. Started on [employer] and Albuterol.  07/13/22 Lurline Del FNP. Orders Only. Ordered Nitrostat 0.4mg .   07/13/22 Lurline Del FNP. Telephone message. Please check out to pharmacy for nitrates, will send now. If BP is dropping too low and weight is decreasing, you have to hold off entresto and call her cardiologist to let them know asap.   Recent consult visits:  07/20/22 (Cardiology) Dulce Sellar, Brain MD. Seen for CHF. D/C Amlodipine 10mg , Metoprolol 50mg  and Sacubitril-Valsatan 24-26mg   Hospital visits:  Medication Reconciliation was completed by comparing discharge summary, patient's EMR and Pharmacy list, and upon discussion with patient.  Admitted to the hospital on 08/03/22 due to Renal Failure. Discharge date was 08/13/22. Discharged from [facility].    New?Medications Started at [facility] Discharge:?? -started:amoxicillin (AMOXIL)  Medication Changes at Hospital Discharge: -Changed FeroSul (ferrous sulfate)   -All other medications will remain the same.    Medications: Outpatient Encounter Medications as of 08/23/2022  Medication Sig Note   atorvastatin (LIPITOR) 40 MG tablet Take 1 tablet (40 mg total) by mouth daily. Per ccm    Budeson-Glycopyrrol-Formoterol (BREZTRI AEROSPHERE) 160-9-4.8 MCG/ACT AERO Inhale 2 puffs into the lungs in the morning and at bedtime. (Patient not taking:  Reported on 08/14/2022) 08/14/2022: 08/14/22- reports not taking due to expense of medication- CCM Pharmacist notified   budesonide (PULMICORT) 0.5 MG/2ML nebulizer solution Take 0.5 mg by nebulization 2 (two) times daily. (Patient not taking: Reported on 08/14/2022) 08/14/2022: 08/14/22- reports not taking due to cost of medication: CCM Pharmacist notified   Cholecalciferol (VITAMIN D) 125 MCG (5000 UT) CAPS Take 1 capsule by mouth daily.    ferrous sulfate 325 (65 FE) MG tablet Take 1 tablet (325 mg total) by mouth daily. 08/14/2022: 08/14/22: Patient reports, "I am taking the vitamin pill;" she is unable to definitively state whether she has this medication or not   ipratropium (ATROVENT) 0.02 % nebulizer solution Take 0.5 mg by nebulization 2 (two) times daily. (Patient not taking: Reported on 08/14/2022) 08/14/2022: 08/14/22: Patient reports not taking due to cost   levalbuterol (XOPENEX) 1.25 MG/3ML nebulizer solution Take 1.25 mg by nebulization 2 (two) times daily. (Patient not taking: Reported on 08/14/2022) 08/14/2022: 08/14/22: Patient reports not taking due to cost   metoprolol succinate (TOPROL-XL) 25 MG 24 hr tablet Take 25 mg by mouth daily.    nitroGLYCERIN (NITROSTAT) 0.4 MG SL tablet Place 1 tablet (0.4 mg total) under the tongue every 5 (five) minutes as needed for chest pain.    ondansetron (ZOFRAN-ODT) 4 MG disintegrating tablet Take 1 tablet (4 mg total) by mouth every 8 (eight) hours as needed for nausea or vomiting.    pantoprazole (PROTONIX) 40 MG tablet Take 1 tablet (40 mg total) by mouth daily.    polyethylene glycol powder (GLYCOLAX/MIRALAX) 17 GM/SCOOP powder Take 17 g by mouth daily as needed for mild constipation.    rOPINIRole (REQUIP) 2 MG tablet TAKE 1 TABLET(2 MG) BY MOUTH IN THE MORNING  AND AT BEDTIME (Patient taking differently: Take 2 mg by mouth 2 (two) times daily.)    sodium bicarbonate 650 MG tablet Take 1 tablet (650 mg total) by mouth 3 (three) times daily.    torsemide (DEMADEX) 20 MG  tablet Take 20 mg by mouth daily as needed (edema).    VENTOLIN HFA 108 (90 Base) MCG/ACT inhaler Inhale 1-2 puffs into the lungs every 6 (six) hours as needed for wheezing or shortness of breath. 08/14/2022: 08/14/22: reports was given this medication at the hospital; reports is taking she "thinks"   No facility-administered encounter medications on file as of 08/23/2022.    Recent Office Vitals: BP Readings from Last 3 Encounters:  08/13/22 (!) 148/67  07/31/22 (!) 104/58  07/20/22 90/60   Pulse Readings from Last 3 Encounters:  08/13/22 87  07/31/22 (!) 105  07/20/22 96    Wt Readings from Last 3 Encounters:  08/13/22 159 lb 6.3 oz (72.3 kg)  07/20/22 147 lb (66.7 kg)  07/03/22 166 lb (75.3 kg)     Kidney Function Lab Results  Component Value Date/Time   CREATININE 3.50 (H) 08/13/2022 12:19 AM   CREATININE 3.75 (H) 08/12/2022 12:32 AM   CREATININE 3.05 (HH) 06/13/2022 10:21 AM   CREATININE 2.97 (H) 06/06/2022 09:17 AM   GFRNONAA 13 (L) 08/13/2022 12:19 AM   GFRNONAA 15 (L) 06/13/2022 10:21 AM   GFRAA 29 (L) 03/28/2020 07:53 AM       Latest Ref Rng & Units 08/13/2022   12:19 AM 08/12/2022   12:32 AM 08/11/2022   12:56 AM  BMP  Glucose 70 - 99 mg/dL 962  952  96   BUN 8 - 23 mg/dL 44  50  52   Creatinine 0.44 - 1.00 mg/dL 8.41  3.24  4.01   Sodium 135 - 145 mmol/L 135  135  136   Potassium 3.5 - 5.1 mmol/L 4.2  4.2  4.1   Chloride 98 - 111 mmol/L 105  104  103   CO2 22 - 32 mmol/L 21  22  22    Calcium 8.9 - 10.3 mg/dL 7.7  7.8  7.7      Roxana Hires, [facility] Clinical Pharmacist Assistant  [phone]

## 2022-08-24 DIAGNOSIS — R6520 Severe sepsis without septic shock: Secondary | ICD-10-CM | POA: Diagnosis not present

## 2022-08-24 DIAGNOSIS — N185 Chronic kidney disease, stage 5: Secondary | ICD-10-CM | POA: Diagnosis not present

## 2022-08-24 DIAGNOSIS — D631 Anemia in chronic kidney disease: Secondary | ICD-10-CM | POA: Diagnosis not present

## 2022-08-24 DIAGNOSIS — A419 Sepsis, unspecified organism: Secondary | ICD-10-CM | POA: Diagnosis not present

## 2022-08-24 DIAGNOSIS — I132 Hypertensive heart and chronic kidney disease with heart failure and with stage 5 chronic kidney disease, or end stage renal disease: Secondary | ICD-10-CM | POA: Diagnosis not present

## 2022-08-24 DIAGNOSIS — I5033 Acute on chronic diastolic (congestive) heart failure: Secondary | ICD-10-CM | POA: Diagnosis not present

## 2022-08-27 ENCOUNTER — Inpatient Hospital Stay: Payer: Medicare Other | Admitting: Internal Medicine

## 2022-08-27 ENCOUNTER — Telehealth: Payer: Self-pay

## 2022-08-27 DIAGNOSIS — D631 Anemia in chronic kidney disease: Secondary | ICD-10-CM | POA: Diagnosis not present

## 2022-08-27 DIAGNOSIS — I132 Hypertensive heart and chronic kidney disease with heart failure and with stage 5 chronic kidney disease, or end stage renal disease: Secondary | ICD-10-CM | POA: Diagnosis not present

## 2022-08-27 DIAGNOSIS — R6520 Severe sepsis without septic shock: Secondary | ICD-10-CM | POA: Diagnosis not present

## 2022-08-27 DIAGNOSIS — N185 Chronic kidney disease, stage 5: Secondary | ICD-10-CM | POA: Diagnosis not present

## 2022-08-27 DIAGNOSIS — I5033 Acute on chronic diastolic (congestive) heart failure: Secondary | ICD-10-CM | POA: Diagnosis not present

## 2022-08-27 DIAGNOSIS — A419 Sepsis, unspecified organism: Secondary | ICD-10-CM | POA: Diagnosis not present

## 2022-08-27 NOTE — Progress Notes (Deleted)
Patient: Alexandra [name]  DOB: 1941-05-16 MRN: [id number] PCP: Rochel Brome, MD  Referring Provider: ***  No chief complaint on file.    Patient Active Problem List   Diagnosis Date Noted   Pneumonia 08/05/2022   Chronic respiratory failure with hypoxia ([place]) 08/05/2022   On home oxygen therapy 08/05/2022   Iron deficiency anemia 08/05/2022   Acute renal failure superimposed on stage 4 chronic kidney disease (HCC) 08/03/2022   High anion gap metabolic acidosis 123456   SIRS (systemic inflammatory response syndrome) ([place]) 08/03/2022   Leukocytosis 08/03/2022   History of COPD 08/03/2022   Restless leg syndrome 07/17/2022   Hypomagnesemia 06/14/2022   Anemia due to antineoplastic chemotherapy 06/13/2022   Fatigue due to radiation therapy 06/13/2022   Frail elderly 06/13/2022   GERD (gastroesophageal reflux disease) 06/06/2022   Acute on chronic heart failure with preserved ejection fraction ([place]) 06/06/2022   Hyperkalemia 05/23/2022   Genetic testing 05/14/2022   Chemotherapy follow-up examination 05/11/2022   Exposure to medical therapeutic radiation 05/11/2022   History of anemia due to chronic kidney disease 04/24/2022   Counseling and coordination of care 04/24/2022   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 Per ccm 90 tablet 3   Budeson-Glycopyrrol-Formoterol (BREZTRI AEROSPHERE) 160-9-4.8 MCG/ACT AERO Inhale 2 puffs into the lungs in the morning and at bedtime. (Patient not taking: Reported on 08/14/2022) 10.7 g 4   budesonide (PULMICORT) 0.5 MG/2ML nebulizer solution Take 0.5 mg by nebulization 2 (two) times daily. (Patient not taking: Reported on 08/14/2022)     Cholecalciferol (VITAMIN D) 125 MCG (5000 UT) CAPS Take 1 capsule by mouth daily.     ferrous sulfate 325 (65 FE) MG tablet Take 1 tablet (325 mg total) by mouth daily. 90 tablet 0   ipratropium (ATROVENT) 0.02 % nebulizer solution Take 0.5 mg by nebulization 2 (two) times daily. (Patient not taking: Reported on 08/14/2022)     levalbuterol (XOPENEX) 1.25 MG/3ML nebulizer solution Take 1.25 mg by nebulization 2 (two) times daily. (Patient not taking: Reported on 08/14/2022)     metoprolol succinate (TOPROL-XL) 25 MG 24 hr tablet Take 25 mg by mouth daily.     nitroGLYCERIN (NITROSTAT) 0.4 MG SL tablet Place 1 tablet (0.4 mg total) under the tongue every 5 (five) minutes as needed for chest pain. 30 tablet 3  ondansetron (ZOFRAN-ODT) 4 MG disintegrating tablet Take 1 tablet (4 mg total) by mouth every 8 (eight) hours as needed for nausea or vomiting. 20 tablet 0   pantoprazole (PROTONIX) 40 MG tablet Take 1 tablet (40 mg total) by mouth daily. 30 tablet 0   polyethylene glycol powder (GLYCOLAX/MIRALAX) 17 GM/SCOOP powder Take 17 g by mouth daily as needed for mild constipation.     rOPINIRole (REQUIP) 2 MG  tablet TAKE 1 TABLET(2 MG) BY MOUTH IN THE MORNING AND AT BEDTIME (Patient taking differently: Take 2 mg by mouth 2 (two) times daily.) 180 tablet 6   sodium bicarbonate 650 MG tablet Take 1 tablet (650 mg total) by mouth 3 (three) times daily. 90 tablet 0   torsemide (DEMADEX) 20 MG tablet Take 20 mg by mouth daily as needed (edema).     VENTOLIN HFA 108 (90 Base) MCG/ACT inhaler Inhale 1-2 puffs into the lungs every 6 (six) hours as needed for wheezing or shortness of breath. 18 g 2   No facility-administered medications prior to visit.     Allergies  Allergen Reactions   Contrast Media [Iodinated Contrast Media] Hives, Itching and Rash   Omeprazole Itching   Shellfish Allergy Nausea And Vomiting   Shellfish-Derived Products Nausea And Vomiting    Social History   Tobacco Use   Smoking status: Former    Packs/day: 1.00    Years: 50.00    Additional pack years: 0.00    Total pack years: 50.00    Types: Cigarettes    Quit date: 06/11/2005    Years since quitting: 17.2   Smokeless tobacco: Never  Vaping Use   Vaping Use: Never used  Substance Use Topics   Alcohol use: No   Drug use: No    Family History  Problem Relation Age of Onset   Emphysema Mother    Hyperlipidemia Sister    Hypertension Sister    COPD Sister    Hyperlipidemia Sister    Hypertension Sister    Lymphoma Sister    Obesity Brother    Hyperlipidemia Brother    Hypertension Brother    Breast cancer Neg Hx     Objective:  There were no vitals filed for this visit. There is no height or weight on file to calculate BMI.  Physical Exam  Lab Results: Lab Results  Component Value Date   WBC 10.3 08/13/2022   HGB 7.4 (L) 08/13/2022   HCT 23.0 (L) 08/13/2022   MCV 96.2 08/13/2022   PLT 222 08/13/2022    Lab Results  Component Value Date   CREATININE 3.50 (H) 08/13/2022   BUN 44 (H) 08/13/2022   NA 135 08/13/2022   K 4.2 08/13/2022   CL 105 08/13/2022   CO2 21 (L) 08/13/2022    Lab Results   Component Value Date   ALT <5 08/13/2022   AST 14 (L) 08/13/2022   ALKPHOS 49 08/13/2022   BILITOT 0.4 08/13/2022     Assessment & Plan:  #E faecalis bacteremia likely secondary to pyelonephritis #Pyelonephritis #AKI on CKD F/U PRN  Laurice Record, MD [place] [facility]   08/27/22  8:59 AM

## 2022-08-27 NOTE — Telephone Encounter (Signed)
Elmyra Ricks from [facility] called and stated that the patient has not been able to afford her dueoneb for nebulizer and or ipratropium or levalbuterol from the pharmacy because prescription was going to be too expensive. Elmyra Ricks was just wanting to inform us and also let us know that the patient is taking her inhailers though but didn't know if you wanted to do something different for the nebulizer medication?

## 2022-08-28 DIAGNOSIS — A419 Sepsis, unspecified organism: Secondary | ICD-10-CM | POA: Diagnosis not present

## 2022-08-28 DIAGNOSIS — I132 Hypertensive heart and chronic kidney disease with heart failure and with stage 5 chronic kidney disease, or end stage renal disease: Secondary | ICD-10-CM | POA: Diagnosis not present

## 2022-08-28 DIAGNOSIS — D631 Anemia in chronic kidney disease: Secondary | ICD-10-CM | POA: Diagnosis not present

## 2022-08-28 DIAGNOSIS — N185 Chronic kidney disease, stage 5: Secondary | ICD-10-CM | POA: Diagnosis not present

## 2022-08-28 DIAGNOSIS — R6520 Severe sepsis without septic shock: Secondary | ICD-10-CM | POA: Diagnosis not present

## 2022-08-28 DIAGNOSIS — I5033 Acute on chronic diastolic (congestive) heart failure: Secondary | ICD-10-CM | POA: Diagnosis not present

## 2022-08-28 NOTE — Progress Notes (Cosign Needed Addendum)
Pt called back and gave a few readings  08/26/22 120/80 68 08/25/22 159/73 68  Pt checks BP before medication and confirms he takes all medications as directed. Advised we will call back and recheck BP readings if they can start checking a few times a week. Pt agreed.  Elray Mcgregor, [facility] Pharmacist Assistant  [phone]

## 2022-08-29 ENCOUNTER — Ambulatory Visit (INDEPENDENT_AMBULATORY_CARE_PROVIDER_SITE_OTHER): Payer: Medicare Other | Admitting: Internal Medicine

## 2022-08-29 ENCOUNTER — Other Ambulatory Visit: Payer: Self-pay

## 2022-08-29 ENCOUNTER — Encounter: Payer: Self-pay | Admitting: Internal Medicine

## 2022-08-29 VITALS — BP 159/82 | HR 98 | Temp 97.8°F | Ht 63.0 in | Wt 150.0 lb

## 2022-08-29 DIAGNOSIS — I5033 Acute on chronic diastolic (congestive) heart failure: Secondary | ICD-10-CM | POA: Diagnosis not present

## 2022-08-29 DIAGNOSIS — I132 Hypertensive heart and chronic kidney disease with heart failure and with stage 5 chronic kidney disease, or end stage renal disease: Secondary | ICD-10-CM | POA: Diagnosis not present

## 2022-08-29 DIAGNOSIS — R6520 Severe sepsis without septic shock: Secondary | ICD-10-CM | POA: Diagnosis not present

## 2022-08-29 DIAGNOSIS — R7881 Bacteremia: Secondary | ICD-10-CM | POA: Diagnosis not present

## 2022-08-29 DIAGNOSIS — A419 Sepsis, unspecified organism: Secondary | ICD-10-CM | POA: Diagnosis not present

## 2022-08-29 DIAGNOSIS — N185 Chronic kidney disease, stage 5: Secondary | ICD-10-CM | POA: Diagnosis not present

## 2022-08-29 DIAGNOSIS — D631 Anemia in chronic kidney disease: Secondary | ICD-10-CM | POA: Diagnosis not present

## 2022-08-29 NOTE — Telephone Encounter (Signed)
Left detailed message informing Alexandra [name] with home health about the response from patient's provider.

## 2022-08-29 NOTE — Progress Notes (Signed)
Patient Active Problem List   Diagnosis Date Noted   Pneumonia 08/05/2022   Chronic respiratory failure with hypoxia ([place]) 08/05/2022   On home oxygen therapy 08/05/2022   Iron deficiency anemia 08/05/2022   Acute renal failure superimposed on stage 4 chronic kidney disease ([place]) 08/03/2022   High anion gap metabolic acidosis 123456   SIRS (systemic inflammatory response syndrome) ([place]) 08/03/2022   Leukocytosis 08/03/2022   History of COPD 08/03/2022   Restless leg syndrome 07/17/2022   Hypomagnesemia 06/14/2022   Anemia due to antineoplastic chemotherapy 06/13/2022   Fatigue due to radiation therapy 06/13/2022   Frail elderly 06/13/2022   GERD (gastroesophageal reflux disease) 06/06/2022   Acute on chronic heart failure with preserved ejection fraction ([place]) 06/06/2022   Hyperkalemia 05/23/2022   Genetic testing 05/14/2022   Chemotherapy follow-up examination 05/11/2022   Exposure to medical therapeutic radiation 05/11/2022   History of anemia due to chronic kidney disease 04/24/2022   Counseling and coordination of care 04/24/2022   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 ([place]) 11/23/2021   Frequent falls 11/23/2021   Pedal edema [id number]   Diastolic dysfunction [id number]   Female stress incontinence 08/03/2019   Mixed conductive and sensorineural hearing loss, 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   Bladder cancer ([place]) 08/05/2018   H/O left nephrectomy 08/05/2018   COPD, moderate ([place]) 03/20/2018   Ex-smoker 01/08/2018   Hyperlipidemia 02/27/2016   History of kidney cancer 02/27/2016   Age related osteoporosis 02/27/2016   Spinal stenosis 02/27/2016    Patient's Medications  New Prescriptions   No medications on file  Previous Medications   ATORVASTATIN (LIPITOR) 40 MG TABLET    Take 1 tablet (40 mg total) by mouth daily. Per ccm   BUDESON-GLYCOPYRROL-FORMOTEROL (BREZTRI AEROSPHERE) 160-9-4.8 MCG/ACT AERO     Inhale 2 puffs into the lungs in the morning and at bedtime.   BUDESONIDE (PULMICORT) 0.5 MG/2ML NEBULIZER SOLUTION    Take 0.5 mg by nebulization 2 (two) times daily.   CHOLECALCIFEROL (VITAMIN D) 125 MCG (5000 UT) CAPS    Take 1 capsule by mouth daily.   FERROUS SULFATE 325 (65 FE) MG TABLET    Take 1 tablet (325 mg total) by mouth daily.   IPRATROPIUM (ATROVENT) 0.02 % NEBULIZER SOLUTION    Take 0.5 mg by nebulization 2 (two) times daily.   LEVALBUTEROL (XOPENEX) 1.25 MG/3ML NEBULIZER SOLUTION    Take 1.25 mg by nebulization 2 (two) times daily.   METOPROLOL SUCCINATE (TOPROL-XL) 25 MG 24 HR TABLET    Take 25 mg by mouth daily.   NITROGLYCERIN (NITROSTAT) 0.4 MG SL TABLET    Place 1 tablet (0.4 mg total) under the tongue every 5 (five) minutes as needed for chest pain.   ONDANSETRON (ZOFRAN-ODT) 4 MG DISINTEGRATING TABLET    Take 1 tablet (4 mg total) by mouth every 8 (eight) hours as needed for nausea or vomiting.   PANTOPRAZOLE (PROTONIX) 40 MG TABLET    Take 1 tablet (40 mg total) by mouth daily.   POLYETHYLENE GLYCOL POWDER (GLYCOLAX/MIRALAX) 17 GM/SCOOP POWDER    Take 17 g by mouth daily as needed for mild constipation.   ROPINIROLE (REQUIP) 2 MG TABLET    TAKE 1 TABLET(2 MG) BY MOUTH IN THE MORNING AND AT BEDTIME   SODIUM BICARBONATE 650 MG TABLET    Take 1 tablet (650 mg total) by mouth 3 (three) times daily.   TORSEMIDE (DEMADEX) 20  MG TABLET    Take 20 mg by mouth daily as needed (edema).   VENTOLIN HFA 108 (90 BASE) MCG/ACT INHALER    Inhale 1-2 puffs into the lungs every 6 (six) hours as needed for wheezing or shortness of breath.  Modified Medications   No medications on file  Discontinued Medications   No medications on file    Subjective: 82 year old female with past medical history of CKD stage IV, COPD, hypertension, hyperlipidemia, RLS, renal carcinoma status post left total nephrectomy in 1999, anemia CKD presents for hospital follow-up of E. coli bacteremia likely not  secondary to pyelonephritis.  She had presented to the ED due to elevated creatinine.  CT renal study showed right hydronephrosis despite ureteral strand may represent stent malfunction, stranding along the right ureter may be pyelonephritis.  Blood cultures on 2/24 grew E faecalis  Patient transition to ampicillin.  Repeat blood culture on 2/10 25 no growth.  TTE showed pericardial effusion on 1/10.  TEE on 3/124 no evidence of vegetation pericardial effusion.  Patient did have a right chest port that she was using 3 to 4 months ago treated bladder cancer.  I reach out to oncology Dr. Karle Starch and noted that he was okay with port removal.  Port was removed 2/28 patient is charged on amoxicillin 1 g twice daily which was renally dosed to complete 2 weeks antibiotics from port removal EOT 3/12. Today 08/29/2022: Pt is still taking abx, no missed doses.   Review of Systems: Review of Systems  All other systems reviewed and are negative.   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   Hypertension    Osteoporosis    Restless legs syndrome    RLS (restless legs syndrome)     Social History   Tobacco Use   Smoking status: Former    Packs/day: 1.00    Years: 50.00    Additional pack years: 0.00    Total pack years: 50.00    Types: Cigarettes    Quit date: 06/11/2005    Years since quitting: 17.2   Smokeless tobacco: Never  Vaping Use   Vaping Use: Never used  Substance Use Topics   Alcohol use: No   Drug use: No    Family History  Problem Relation Age of Onset   Emphysema Mother    Hyperlipidemia Sister    Hypertension Sister    COPD Sister    Hyperlipidemia Sister    Hypertension Sister    Lymphoma Sister    Obesity Brother    Hyperlipidemia Brother    Hypertension Brother     Breast cancer Neg Hx     Allergies  Allergen Reactions   Contrast Media [Iodinated Contrast Media] Hives, Itching and Rash   Omeprazole Itching   Shellfish Allergy Nausea And Vomiting   Shellfish-Derived Products Nausea And Vomiting    Health Maintenance  Topic Date Due   Hepatitis C Screening  Never done   Pneumonia Vaccine 67+ Years old (2 of 2 - PPSV23 or PCV20) 05/22/2017   Zoster Vaccines- Shingrix (2 of 2) 10/12/2021   Medicare Annual Wellness (AWV)  02/16/2023   DTaP/Tdap/Td (2 - Td or Tdap) 08/18/2031   INFLUENZA VACCINE  Completed   DEXA SCAN  Completed   HPV VACCINES  Aged Out  COVID-19 Vaccine  Discontinued    Objective:  There were no vitals filed for this visit. There is no height or weight on file to calculate BMI.  Physical Exam Constitutional:      Appearance: Normal appearance.  HENT:     Head: Normocephalic and atraumatic.     Right Ear: Tympanic membrane normal.     Left Ear: Tympanic membrane normal.     Nose: Nose normal.     Mouth/Throat:     Mouth: Mucous membranes are moist.  Eyes:     Extraocular Movements: Extraocular movements intact.     Conjunctiva/sclera: Conjunctivae normal.     Pupils: Pupils are equal, round, and reactive to light.  Cardiovascular:     Rate and Rhythm: Normal rate and regular rhythm.     Heart sounds: No murmur heard.    No friction rub. No gallop.     Comments: Port site is non-tender Pulmonary:     Effort: Pulmonary effort is normal.     Breath sounds: Normal breath sounds.  Abdominal:     General: Abdomen is flat.     Palpations: Abdomen is soft.  Musculoskeletal:        General: Normal range of motion.  Skin:    General: Skin is warm and dry.  Neurological:     General: No focal deficit present.     Mental Status: She is alert and oriented to person, place, and time.  Psychiatric:        Mood and Affect: Mood normal.     Lab Results Lab Results  Component Value Date   WBC 10.3 08/13/2022    HGB 7.4 (L) 08/13/2022   HCT 23.0 (L) 08/13/2022   MCV 96.2 08/13/2022   PLT 222 08/13/2022    Lab Results  Component Value Date   CREATININE 3.50 (H) 08/13/2022   BUN 44 (H) 08/13/2022   NA 135 08/13/2022   K 4.2 08/13/2022   CL 105 08/13/2022   CO2 21 (L) 08/13/2022    Lab Results  Component Value Date   ALT <5 08/13/2022   AST 14 (L) 08/13/2022   ALKPHOS 49 08/13/2022   BILITOT 0.4 08/13/2022    Lab Results  Component Value Date   CHOL 110 02/27/2022   HDL 44 02/27/2022   LDLCALC 50 02/27/2022   TRIG 77 02/27/2022   CHOLHDL 2.5 02/27/2022   No results found for: "LABRPR", "RPRTITER" No results found for: "HIV1RNAQUANT", "HIV1RNAVL", "CD4TABS"   Problem List Items Addressed This Visit   None  Assessment/Plan #E faecalis bacteremia likely secondary to pyelonephritis with blood and urine cultures growing E faecalis. #Pyelonephritis #Right chest port status post removal on 2/28 - CT showed pyelonephritis, blood cultures 2/24 grew E faecalis, no growth on 2/25.  Urine cultures grew E faecalis.  Port pulled on 2/28 Plan: -Stop amoxicillin as she has taken >2 weeks of abx from port removal -Follow-up with ID as needed   #AKI on CKD -Improving [place] Record, [place] [facility] 08/29/2022, 1:22 PM

## 2022-08-30 DIAGNOSIS — D631 Anemia in chronic kidney disease: Secondary | ICD-10-CM | POA: Diagnosis not present

## 2022-08-30 DIAGNOSIS — A419 Sepsis, unspecified organism: Secondary | ICD-10-CM | POA: Diagnosis not present

## 2022-08-30 DIAGNOSIS — I132 Hypertensive heart and chronic kidney disease with heart failure and with stage 5 chronic kidney disease, or end stage renal disease: Secondary | ICD-10-CM | POA: Diagnosis not present

## 2022-08-30 DIAGNOSIS — I5033 Acute on chronic diastolic (congestive) heart failure: Secondary | ICD-10-CM | POA: Diagnosis not present

## 2022-08-30 DIAGNOSIS — R6520 Severe sepsis without septic shock: Secondary | ICD-10-CM | POA: Diagnosis not present

## 2022-08-30 DIAGNOSIS — N185 Chronic kidney disease, stage 5: Secondary | ICD-10-CM | POA: Diagnosis not present

## 2022-08-31 ENCOUNTER — Ambulatory Visit: Payer: Medicare Other | Admitting: Family Medicine

## 2022-08-31 ENCOUNTER — Telehealth: Payer: Self-pay

## 2022-08-31 DIAGNOSIS — J4489 Other specified chronic obstructive pulmonary disease: Secondary | ICD-10-CM | POA: Diagnosis not present

## 2022-08-31 DIAGNOSIS — J189 Pneumonia, unspecified organism: Secondary | ICD-10-CM | POA: Diagnosis not present

## 2022-08-31 DIAGNOSIS — J441 Chronic obstructive pulmonary disease with (acute) exacerbation: Secondary | ICD-10-CM | POA: Diagnosis not present

## 2022-08-31 DIAGNOSIS — J9621 Acute and chronic respiratory failure with hypoxia: Secondary | ICD-10-CM | POA: Diagnosis not present

## 2022-08-31 DIAGNOSIS — I700 Atherosclerosis of aorta: Secondary | ICD-10-CM | POA: Diagnosis not present

## 2022-08-31 DIAGNOSIS — D72829 Elevated white blood cell count, unspecified: Secondary | ICD-10-CM | POA: Diagnosis not present

## 2022-08-31 DIAGNOSIS — M1990 Unspecified osteoarthritis, unspecified site: Secondary | ICD-10-CM | POA: Diagnosis not present

## 2022-08-31 DIAGNOSIS — M109 Gout, unspecified: Secondary | ICD-10-CM | POA: Diagnosis not present

## 2022-08-31 DIAGNOSIS — I5033 Acute on chronic diastolic (congestive) heart failure: Secondary | ICD-10-CM | POA: Diagnosis not present

## 2022-08-31 DIAGNOSIS — E213 Hyperparathyroidism, unspecified: Secondary | ICD-10-CM | POA: Diagnosis not present

## 2022-08-31 DIAGNOSIS — G2581 Restless legs syndrome: Secondary | ICD-10-CM | POA: Diagnosis not present

## 2022-08-31 DIAGNOSIS — E039 Hypothyroidism, unspecified: Secondary | ICD-10-CM | POA: Diagnosis not present

## 2022-08-31 DIAGNOSIS — N185 Chronic kidney disease, stage 5: Secondary | ICD-10-CM | POA: Diagnosis not present

## 2022-08-31 DIAGNOSIS — K219 Gastro-esophageal reflux disease without esophagitis: Secondary | ICD-10-CM | POA: Diagnosis not present

## 2022-08-31 DIAGNOSIS — C672 Malignant neoplasm of lateral wall of bladder: Secondary | ICD-10-CM | POA: Diagnosis not present

## 2022-08-31 DIAGNOSIS — J439 Emphysema, unspecified: Secondary | ICD-10-CM | POA: Diagnosis not present

## 2022-08-31 DIAGNOSIS — R6520 Severe sepsis without septic shock: Secondary | ICD-10-CM | POA: Diagnosis not present

## 2022-08-31 DIAGNOSIS — M810 Age-related osteoporosis without current pathological fracture: Secondary | ICD-10-CM | POA: Diagnosis not present

## 2022-08-31 DIAGNOSIS — I132 Hypertensive heart and chronic kidney disease with heart failure and with stage 5 chronic kidney disease, or end stage renal disease: Secondary | ICD-10-CM | POA: Diagnosis not present

## 2022-08-31 DIAGNOSIS — D631 Anemia in chronic kidney disease: Secondary | ICD-10-CM | POA: Diagnosis not present

## 2022-08-31 DIAGNOSIS — N179 Acute kidney failure, unspecified: Secondary | ICD-10-CM | POA: Diagnosis not present

## 2022-08-31 DIAGNOSIS — A419 Sepsis, unspecified organism: Secondary | ICD-10-CM | POA: Diagnosis not present

## 2022-08-31 DIAGNOSIS — G4700 Insomnia, unspecified: Secondary | ICD-10-CM | POA: Diagnosis not present

## 2022-08-31 DIAGNOSIS — J440 Chronic obstructive pulmonary disease with acute lower respiratory infection: Secondary | ICD-10-CM | POA: Diagnosis not present

## 2022-08-31 DIAGNOSIS — E782 Mixed hyperlipidemia: Secondary | ICD-10-CM | POA: Diagnosis not present

## 2022-08-31 NOTE — Telephone Encounter (Cosign Needed)
Just saw Nathan's note about the [place]. I will go ahead and have this application mailed back to her as well  Elray Mcgregor, [place] Pharmacist Assistant  [phone]

## 2022-08-31 NOTE — Telephone Encounter (Cosign Needed)
08/31/22- Called pt today. I have started an Librarian, academic for her inhalers and nebulizer's. It looks like we did PAP last year for her Breztri inhaler through [facility]. I called pt but no answer to verify with her she turned this in or if she needs a new application. Will try again Monday.  Alexandra [name], [place] Pharmacist Assistant  [phone]

## 2022-09-03 DIAGNOSIS — I5033 Acute on chronic diastolic (congestive) heart failure: Secondary | ICD-10-CM | POA: Diagnosis not present

## 2022-09-03 DIAGNOSIS — N185 Chronic kidney disease, stage 5: Secondary | ICD-10-CM | POA: Diagnosis not present

## 2022-09-03 DIAGNOSIS — R6520 Severe sepsis without septic shock: Secondary | ICD-10-CM | POA: Diagnosis not present

## 2022-09-03 DIAGNOSIS — I132 Hypertensive heart and chronic kidney disease with heart failure and with stage 5 chronic kidney disease, or end stage renal disease: Secondary | ICD-10-CM | POA: Diagnosis not present

## 2022-09-03 DIAGNOSIS — D631 Anemia in chronic kidney disease: Secondary | ICD-10-CM | POA: Diagnosis not present

## 2022-09-03 DIAGNOSIS — A419 Sepsis, unspecified organism: Secondary | ICD-10-CM | POA: Diagnosis not present

## 2022-09-04 ENCOUNTER — Ambulatory Visit: Payer: Self-pay | Admitting: Licensed Clinical Social Worker

## 2022-09-04 DIAGNOSIS — D631 Anemia in chronic kidney disease: Secondary | ICD-10-CM | POA: Diagnosis not present

## 2022-09-04 DIAGNOSIS — R6520 Severe sepsis without septic shock: Secondary | ICD-10-CM | POA: Diagnosis not present

## 2022-09-04 DIAGNOSIS — I132 Hypertensive heart and chronic kidney disease with heart failure and with stage 5 chronic kidney disease, or end stage renal disease: Secondary | ICD-10-CM | POA: Diagnosis not present

## 2022-09-04 DIAGNOSIS — I5033 Acute on chronic diastolic (congestive) heart failure: Secondary | ICD-10-CM | POA: Diagnosis not present

## 2022-09-04 DIAGNOSIS — A419 Sepsis, unspecified organism: Secondary | ICD-10-CM | POA: Diagnosis not present

## 2022-09-04 DIAGNOSIS — N185 Chronic kidney disease, stage 5: Secondary | ICD-10-CM | POA: Diagnosis not present

## 2022-09-04 NOTE — Telephone Encounter (Cosign Needed)
Spoke with pt today and she needs the Kayexalate sent into her pharmacy [facility] in [place] for her to start taking. Her appt is next week. Please advise.  Elray Mcgregor, [place] Pharmacist Assistant  [phone]

## 2022-09-04 NOTE — Patient Instructions (Signed)
Visit Information  Thank you for taking time to visit with me today. Please don't hesitate to contact me if I can be of assistance to you.   Following are the goals we discussed today:   Goals Addressed             This Visit's Progress    Patient Stated she is trying to manage her medical needs and manage needs of her spouse       Interventions:  LCSW spoke today via phone with  [name]. Discussed medication procurement. She said she is having some difficulty sleeping. She said she sometimes has a reduced appetite Discussed ambulation. She is at risk for falls. She had a recent fall but did not have serious injury Discussed Transport help. She said she has a friend who helps transport her to and from her medical appointments Reviewed program support with client Encouraged Krystalyn to call LCSW for SW support as needed at [phone].      Our next appointment is by telephone on 10/16/22 at 10:30 AM   Please call the care guide team at [phone] if you need to cancel or reschedule your appointment.   If you are experiencing a Mental Health or [place] or need someone to talk to, please go to [facility] [place] [phone])   The patient verbalized understanding of instructions, educational materials, and care plan provided today and DECLINED offer to receive copy of patient instructions, educational materials, and care plan.   The patient has been provided with contact information for the care management team and has been advised to call with any health related questions or concerns.   Norva Riffle.Draycen Leichter MSW, [place] Holiday representative [facility] Care Management [phone]

## 2022-09-04 NOTE — Patient Outreach (Signed)
  Care Coordination   Follow Up Visit Note   09/04/2022 Name: Alexandra [name] MRN: [id number] DOB: 04/18/1941  Alexandra [name] is a 82 y.o. year old female who sees Cox, Elnita Maxwell, MD for primary care. I spoke with  Alexandra [name] by phone today.  What matters to the patients health and wellness today?  Alexandra [name] is trying to manage her medical needs   and the needs of her spouse     Goals Addressed             This Visit's Progress    Patient Stated she is trying to manage her medical needs and manage needs of her spouse       Interventions:  LCSW spoke today via phone with  Alexandra [name]. Discussed medication procurement. She said she is having some difficulty sleeping. She said she sometimes has a reduced appetite Discussed ambulation. She is at risk for falls. She had a recent fall but did not have serious injury Discussed Transport help. She said she has a friend who helps transport her to and from her medical appointments Reviewed program support with client Encouraged [place] to call LCSW for SW support as needed at [phone].        SDOH assessments and interventions completed:  Yes  SDOH Interventions Today    Flowsheet Row Most Recent Value  SDOH Interventions   Physical Activity Interventions Other (Comments)  [risk for falls]  Stress Interventions Provide Counseling  [has stress related to managing medical needs]        Care Coordination Interventions:  Yes, provided   Interventions Today    Flowsheet Row Most Recent Value  Chronic Disease   Chronic disease during today's visit Other  [spoke with client about her current needs]  General Interventions   General Interventions Discussed/Reviewed General Interventions Discussed, [employer]  [discussed program support]  Exercise Interventions   Exercise Discussed/Reviewed Physical Activity  [risk of falls]  Education Interventions   Education Provided Provided Education  Provided Financial risk analyst  Mental Health Interventions   Mental Health Discussed/Reviewed Coping Strategies  [discussed her visits with PCP and her coping skills]  Safety Interventions   Safety Discussed/Reviewed Fall Risk  [had recent fall,  no serious injury]       Follow up plan: Follow up call scheduled for 10/16/22 at 10:30 AM    Encounter Outcome:  Pt. Visit Completed   Norva Riffle.Bandy Honaker MSW, [place] Holiday representative [facility] Care Management [phone]

## 2022-09-06 DIAGNOSIS — N185 Chronic kidney disease, stage 5: Secondary | ICD-10-CM | POA: Diagnosis not present

## 2022-09-06 DIAGNOSIS — D631 Anemia in chronic kidney disease: Secondary | ICD-10-CM | POA: Diagnosis not present

## 2022-09-06 DIAGNOSIS — I5033 Acute on chronic diastolic (congestive) heart failure: Secondary | ICD-10-CM | POA: Diagnosis not present

## 2022-09-06 DIAGNOSIS — A419 Sepsis, unspecified organism: Secondary | ICD-10-CM | POA: Diagnosis not present

## 2022-09-06 DIAGNOSIS — I132 Hypertensive heart and chronic kidney disease with heart failure and with stage 5 chronic kidney disease, or end stage renal disease: Secondary | ICD-10-CM | POA: Diagnosis not present

## 2022-09-06 DIAGNOSIS — R6520 Severe sepsis without septic shock: Secondary | ICD-10-CM | POA: Diagnosis not present

## 2022-09-07 DIAGNOSIS — I5033 Acute on chronic diastolic (congestive) heart failure: Secondary | ICD-10-CM | POA: Diagnosis not present

## 2022-09-07 DIAGNOSIS — R6520 Severe sepsis without septic shock: Secondary | ICD-10-CM | POA: Diagnosis not present

## 2022-09-07 DIAGNOSIS — N185 Chronic kidney disease, stage 5: Secondary | ICD-10-CM | POA: Diagnosis not present

## 2022-09-07 DIAGNOSIS — D631 Anemia in chronic kidney disease: Secondary | ICD-10-CM | POA: Diagnosis not present

## 2022-09-07 DIAGNOSIS — A419 Sepsis, unspecified organism: Secondary | ICD-10-CM | POA: Diagnosis not present

## 2022-09-07 DIAGNOSIS — I132 Hypertensive heart and chronic kidney disease with heart failure and with stage 5 chronic kidney disease, or end stage renal disease: Secondary | ICD-10-CM | POA: Diagnosis not present

## 2022-09-10 DIAGNOSIS — N185 Chronic kidney disease, stage 5: Secondary | ICD-10-CM | POA: Diagnosis not present

## 2022-09-10 DIAGNOSIS — D631 Anemia in chronic kidney disease: Secondary | ICD-10-CM | POA: Diagnosis not present

## 2022-09-10 DIAGNOSIS — I132 Hypertensive heart and chronic kidney disease with heart failure and with stage 5 chronic kidney disease, or end stage renal disease: Secondary | ICD-10-CM | POA: Diagnosis not present

## 2022-09-10 DIAGNOSIS — R6520 Severe sepsis without septic shock: Secondary | ICD-10-CM | POA: Diagnosis not present

## 2022-09-10 DIAGNOSIS — A419 Sepsis, unspecified organism: Secondary | ICD-10-CM | POA: Diagnosis not present

## 2022-09-10 DIAGNOSIS — I5033 Acute on chronic diastolic (congestive) heart failure: Secondary | ICD-10-CM | POA: Diagnosis not present

## 2022-09-11 ENCOUNTER — Telehealth: Payer: Medicare Other

## 2022-09-11 DIAGNOSIS — A419 Sepsis, unspecified organism: Secondary | ICD-10-CM | POA: Diagnosis not present

## 2022-09-11 DIAGNOSIS — R6520 Severe sepsis without septic shock: Secondary | ICD-10-CM | POA: Diagnosis not present

## 2022-09-11 DIAGNOSIS — N185 Chronic kidney disease, stage 5: Secondary | ICD-10-CM | POA: Diagnosis not present

## 2022-09-11 DIAGNOSIS — I5033 Acute on chronic diastolic (congestive) heart failure: Secondary | ICD-10-CM | POA: Diagnosis not present

## 2022-09-11 DIAGNOSIS — D631 Anemia in chronic kidney disease: Secondary | ICD-10-CM | POA: Diagnosis not present

## 2022-09-11 DIAGNOSIS — I132 Hypertensive heart and chronic kidney disease with heart failure and with stage 5 chronic kidney disease, or end stage renal disease: Secondary | ICD-10-CM | POA: Diagnosis not present

## 2022-09-12 ENCOUNTER — Ambulatory Visit: Payer: Medicare Other | Admitting: Family Medicine

## 2022-09-12 DIAGNOSIS — I132 Hypertensive heart and chronic kidney disease with heart failure and with stage 5 chronic kidney disease, or end stage renal disease: Secondary | ICD-10-CM | POA: Diagnosis not present

## 2022-09-12 DIAGNOSIS — I5033 Acute on chronic diastolic (congestive) heart failure: Secondary | ICD-10-CM | POA: Diagnosis not present

## 2022-09-12 DIAGNOSIS — D631 Anemia in chronic kidney disease: Secondary | ICD-10-CM | POA: Diagnosis not present

## 2022-09-12 DIAGNOSIS — R6520 Severe sepsis without septic shock: Secondary | ICD-10-CM | POA: Diagnosis not present

## 2022-09-12 DIAGNOSIS — A419 Sepsis, unspecified organism: Secondary | ICD-10-CM | POA: Diagnosis not present

## 2022-09-12 DIAGNOSIS — N185 Chronic kidney disease, stage 5: Secondary | ICD-10-CM | POA: Diagnosis not present

## 2022-09-14 DIAGNOSIS — I132 Hypertensive heart and chronic kidney disease with heart failure and with stage 5 chronic kidney disease, or end stage renal disease: Secondary | ICD-10-CM | POA: Diagnosis not present

## 2022-09-14 DIAGNOSIS — R6520 Severe sepsis without septic shock: Secondary | ICD-10-CM | POA: Diagnosis not present

## 2022-09-14 DIAGNOSIS — N185 Chronic kidney disease, stage 5: Secondary | ICD-10-CM | POA: Diagnosis not present

## 2022-09-14 DIAGNOSIS — D631 Anemia in chronic kidney disease: Secondary | ICD-10-CM | POA: Diagnosis not present

## 2022-09-14 DIAGNOSIS — A419 Sepsis, unspecified organism: Secondary | ICD-10-CM | POA: Diagnosis not present

## 2022-09-14 DIAGNOSIS — I5033 Acute on chronic diastolic (congestive) heart failure: Secondary | ICD-10-CM | POA: Diagnosis not present

## 2022-09-16 ENCOUNTER — Encounter: Payer: Self-pay | Admitting: Family Medicine

## 2022-09-16 NOTE — Telephone Encounter (Signed)
error 

## 2022-09-16 NOTE — Progress Notes (Signed)
Subjective:  Patient ID: Alexandra [name], female    DOB: 1940-08-02  Age: 82 y.o. MRN: [id number]  Chief Complaint  Patient presents with   Chronic Kidney Disease    HPI COPD:  Breztri 2 puffs twice daily,  albuterol inhaler one puff daily.    Patient is currently on oxygen 2 L. Discharged from hospital in February 2024.   CKD stage 4: sees [facility]. They just increased her torsemide to 20 mg twice daily, but she ran out one week ago.   Hypertension:  Torsemide 20 mg twice daily. TEE normal (07/2022.)  Patient reports she did not hear results from this.  I reviewed the following results with patient.  TEE: IMPRESSIONS 1. Left ventricular ejection fraction, by estimation, is 60 to 65% . The left ventricle has normal function. The left ventricle has no regional wall motion abnormalities. 2. Right ventricular systolic function is normal. The right ventricular size is normal. 3. No left atrial/ left atrial appendage thrombus was detected. 4. The mitral valve is grossly normal. Trivial mitral valve regurgitation. 5. The aortic valve is tricuspid. Aortic valve regurgitation is not visualized. Aortic valve sclerosis is present, with no evidence of aortic valve stenosis.  Osteoporosis:  vitamin D 40981 units daily.    Hyperlipidemia:  Lipitor 40 mg daily.    Bladder Cancer: sees Dr. Saddie Benders and Dr. Constance Goltz. She has stopped chemotherapy. She is feeling much better. The patient does not know if chemo helped or not. She feels like no one has communicated with her.  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   RLS: on ropinirole 2 mg twice daily.        09/17/2022    8:59 AM 08/29/2022    2:30 PM 05/30/2022   10:19 AM 04/10/2022    7:55 AM 02/15/2022    1:31 PM  Depression screen PHQ 2/9  Decreased  Interest 0 0 0 0 0  Down, Depressed, Hopeless 0 0 0 0 0  PHQ - 2 Score 0 0 0 0 0  Altered sleeping   0 0   Tired, decreased energy   0 0   Change in appetite   0 0   Feeling bad or failure about yourself    0 0   Trouble concentrating   0 0   Moving slowly or fidgety/restless   0 0   Suicidal thoughts   0 0   PHQ-9 Score   0 0   Difficult doing work/chores   Not difficult at all Not difficult at all          07/17/2022    3:40 PM 08/29/2022    2:30 PM 09/04/2022    9:37 AM 09/17/2022    8:58 AM 09/26/2022   12:28 PM  Fall Risk  Falls in the past year? 0 1 1 1    Was there an injury with Fall? 0 1 1 1    Fall Risk Category Calculator 0 2 2 2    Patient at Risk for Falls Due to Impaired mobility History of fall(s);Impaired balance/gait History of fall(s);Impaired balance/gait History of fall(s);Impaired mobility Impaired balance/gait  Fall risk Follow up Falls evaluation completed;Education provided;Falls prevention discussed Falls evaluation completed  Falls evaluation completed;Falls prevention discussed       Review of Systems  Constitutional:  Negative for chills,  fatigue and fever.  HENT:  Negative for congestion, ear pain and sore throat.   Respiratory:  Negative for cough and shortness of breath.   Cardiovascular:  Negative for chest pain.  Gastrointestinal:  Positive for nausea. Negative for abdominal pain, constipation, diarrhea and vomiting.  Genitourinary:  Negative for dysuria and urgency.  Musculoskeletal:  Negative for arthralgias and myalgias.  Skin:  Negative for rash.  Neurological:  Negative for dizziness and headaches.  Psychiatric/Behavioral:  Negative for dysphoric mood. The patient is not nervous/anxious.     Current Outpatient Medications on File Prior to Visit  Medication Sig Dispense Refill   atorvastatin (LIPITOR) 40 MG tablet Take 1 tablet (40 mg total) by mouth daily. Per ccm 90 tablet 3   Budeson-Glycopyrrol-Formoterol (BREZTRI AEROSPHERE) 160-9-4.8  MCG/ACT AERO Inhale 2 puffs into the lungs in the morning and at bedtime. 10.7 g 4   Cholecalciferol (VITAMIN D) 125 MCG (5000 UT) CAPS Take 1 capsule by mouth daily.     ferrous sulfate 325 (65 FE) MG tablet Take 1 tablet (325 mg total) by mouth daily. 90 tablet 0   levalbuterol (XOPENEX) 1.25 MG/3ML nebulizer solution Take 1.25 mg by nebulization 2 (two) times daily.     nitroGLYCERIN (NITROSTAT) 0.4 MG SL tablet Place 1 tablet (0.4 mg total) under the tongue every 5 (five) minutes as needed for chest pain. 30 tablet 3   ondansetron (ZOFRAN-ODT) 4 MG disintegrating tablet Take 1 tablet (4 mg total) by mouth every 8 (eight) hours as needed for nausea or vomiting. 20 tablet 0   OXYGEN Inhale 2 L into the lungs continuous.     rOPINIRole (REQUIP) 2 MG tablet TAKE 1 TABLET(2 MG) BY MOUTH IN THE MORNING AND AT BEDTIME (Patient taking differently: Take 2 mg by mouth 2 (two) times daily.) 180 tablet 6   VENTOLIN HFA 108 (90 Base) MCG/ACT inhaler Inhale 1-2 puffs into the lungs every 6 (six) hours as needed for wheezing or shortness of breath. 18 g 2   No current facility-administered medications on file prior to visit.   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 82 yo   BACK SURGERY  2016   lower L 4 to L 5   bladder tack and uterus lift  10/10/2015   CHOLECYSTECTOMY     summer of 2022   CYSTOSCOPY WITH RETROGRADE PYELOGRAM, URETEROSCOPY AND STENT PLACEMENT Right 08/04/2022   Procedure: CYSTOSCOPY WITH RIGHT URETERAL STENT  EXCHANGE;  Surgeon: Despina Arias, MD;  Location: [facility] OR;  Service: Urology;  Laterality: Right;   IR RADIOLOGIST EVAL & MGMT  12/03/2018   IR RADIOLOGIST EVAL & MGMT  12/31/2018   IR REMOVAL TUN ACCESS W/ PORT W/O FL MOD SED  08/08/2022   PARATHYROIDECTOMY Left 07/02/2016   Procedure: LEFT INFERIOR PARATHYROIDECTOMY;  Surgeon: Darnell Level, MD;  Location: [facility] ORS;  Service: General;  Laterality: Left;   SHOULDER OPEN ROTATOR CUFF REPAIR  12/2019   TEE WITHOUT CARDIOVERSION N/A 08/10/2022   Procedure: TRANSESOPHAGEAL ECHOCARDIOGRAM (TEE);  Surgeon: Chrystie Nose, MD;  Location: [facility] ENDOSCOPY;  Service: Cardiovascular;  Laterality: N/A;   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    Family History  Problem Relation Age of Onset   Emphysema Mother    Hyperlipidemia Sister    Hypertension Sister    COPD Sister    Hyperlipidemia Sister    Hypertension Sister    Lymphoma Sister    Obesity Brother    Hyperlipidemia Brother    Hypertension Brother    Breast cancer Neg Hx    Social History   Socioeconomic History   Marital status: Married    Spouse name: Molly Maduro   Number of children: 2   Years of education: 11   Highest education level: 11th grade  Occupational History   Occupation: Retired  Tobacco Use   Smoking status: Former    Packs/day: 1.00    Years: 50.00    Additional pack years: 0.00    Total pack years: 50.00    Types: Cigarettes    Quit date: 06/11/2005    Years since quitting: 17.3   Smokeless tobacco: Never  Vaping Use   Vaping Use: Never used  Substance and Sexual Activity   Alcohol use: No   Drug use: No   Sexual activity: Yes    Partners: Male  Other Topics Concern   Not on file  Social History Narrative   Not on file   Social Determinants of Health   Financial Resource Strain: Medium Risk (09/26/2022)   Overall Financial Resource Strain (CARDIA)    Difficulty of Paying Living Expenses: Somewhat hard  Food Insecurity: No Food Insecurity (08/14/2022)    Hunger Vital Sign    Worried About Running Out of Food in the Last Year: Never true    Ran Out of Food in the Last Year: Never true  Transportation Needs: No Transportation Needs (08/14/2022)   PRAPARE - Administrator, Civil Service (Medical): No    Lack of Transportation (Non-Medical): No  Physical Activity: Inactive (09/26/2022)   Exercise Vital Sign    Days of Exercise per Week: 0 days    Minutes of Exercise per Session: 0 min  Stress: Stress Concern Present (09/26/2022)   [employer] of Occupational Health - Occupational Stress Questionnaire    Feeling of Stress : To some extent  Social Connections: Moderately Integrated (02/15/2022)   Social Connection and Isolation Panel [NHANES]    Frequency of Communication with Friends and Family: More than three times a week    Frequency of Social Gatherings with Friends and Family: More than three times a week    Attends Religious Services: More than 4 times per year    Active Member of [employer] or Organizations: No    Attends Engineer, structural: Never    Marital Status: Married    Objective:  BP 130/64   Pulse (!) 56   Temp (!) 96.7 F (35.9 C)   Resp 18   Ht 5\' 3"  (1.6 m)   Wt 144 lb (65.3 kg)   BMI 25.51 kg/m      10/05/2022    9:05 AM 09/17/2022    8:52 AM 08/29/2022    2:27 PM  BP/Weight  Systolic BP 130 130 159  Diastolic BP 68 64 82  Wt. (Lbs) 137 144 150  BMI 25.06 kg/m2 25.51 kg/m2 26.57 kg/m2    Physical Exam Vitals reviewed.  Constitutional:      Appearance: Normal appearance. She is normal  weight.  Neck:     Vascular: No carotid bruit.  Cardiovascular:     Rate and Rhythm: Normal rate and regular rhythm.     Heart sounds: Normal heart sounds.  Pulmonary:     Effort: Pulmonary effort is normal. No respiratory distress.     Breath sounds: Normal breath sounds.  Abdominal:     General: Abdomen is flat. Bowel sounds are normal.     Palpations: Abdomen is soft.     Tenderness: There is  no abdominal tenderness.  Neurological:     Mental Status: She is alert and oriented to person, place, and time.  Psychiatric:        Mood and Affect: Mood normal.        Behavior: Behavior normal.     Diabetic Foot Exam - Simple   No data filed      Lab Results  Component Value Date   WBC 8.3 10/05/2022   HGB 9.2 (L) 10/05/2022   HCT 28.3 (L) 10/05/2022   PLT 337 10/05/2022   GLUCOSE 92 10/05/2022   CHOL 205 (H) 10/05/2022   TRIG 100 10/05/2022   HDL 68 10/05/2022   LDLCALC 119 (H) 10/05/2022   ALT 18 10/05/2022   AST 22 10/05/2022   NA 135 10/05/2022   K 7.5 (HH) 10/05/2022   CL 102 10/05/2022   CREATININE 3.94 (H) 10/05/2022   BUN 42 (H) 10/05/2022   CO2 16 (L) 10/05/2022   TSH 15.600 (H) 09/17/2022   INR 1.1 08/04/2022   HGBA1C 5.6 04/04/2021      Assessment & Plan:    Malignant neoplasm of urinary bladder, unspecified site [facility]) Assessment & Plan: I will discuss with oncology.   Chronic kidney disease, stage IV (severe) (HCC) Assessment & Plan: Defer management to nephrology. Per their notes, patient is not interested in dialysis.  Orders: -     Torsemide; Take 1 tablet (20 mg total) by mouth daily as needed (edema).  Dispense: 30 tablet; Refill: 2  Chronic respiratory failure with hypoxia (HCC) Assessment & Plan: Continue oxygen 2 L daily.    On home oxygen therapy Assessment & Plan: On 2 L oxygen.  Orders: -     Pulse oximetry, overnight; Future  Age-related osteoporosis without current pathological fracture Assessment & Plan: Continue vitamin D 50K daily.   COPD, moderate (HCC) Assessment & Plan: The current medical regimen is effective;  continue present plan and medications. Breztri 2 puffs twice daily,  albuterol inhaler one puff daily.      Hypertensive heart disease with chronic diastolic congestive heart failure (HCC) Assessment & Plan: Fair control. Continue torsemide 20 mg twice daily.   Orders: -     CBC with  Differential/Platelet -     Comprehensive metabolic panel -     TSH  Gastroesophageal reflux disease with esophagitis without hemorrhage  Anemia, unspecified type Assessment & Plan: Check labs.   Orders: -     B12 and Folate Panel -     Methylmalonic acid, serum -     Iron, TIBC and Ferritin Panel -     TSH  H/O left nephrectomy Assessment & Plan: Secondary to renal cancer.      Meds ordered this encounter  Medications   torsemide (DEMADEX) 20 MG tablet    Sig: Take 1 tablet (20 mg total) by mouth daily as needed (edema).    Dispense:  30 tablet    Refill:  2    Orders Placed This Encounter  Procedures   CBC with Differential/Platelet   Comprehensive metabolic panel   Z61 and Folate Panel   Methylmalonic acid, serum   Iron, TIBC and Ferritin Panel   TSH   Pulse oximetry, overnight     Follow-up: Return in about 6 weeks (around 10/29/2022).   I,Zebulen Simonis,acting as a Neurosurgeon for Blane Ohara, MD.,have documented all relevant documentation on the behalf of Blane Ohara, MD,as directed by  Blane Ohara, MD while in the presence of Blane Ohara, MD.   An After Visit Summary was printed and given to the patient.  Blane Ohara, MD [facility] [phone]

## 2022-09-17 ENCOUNTER — Ambulatory Visit (INDEPENDENT_AMBULATORY_CARE_PROVIDER_SITE_OTHER): Payer: Medicare Other | Admitting: Family Medicine

## 2022-09-17 ENCOUNTER — Ambulatory Visit: Payer: Self-pay | Admitting: Licensed Clinical Social Worker

## 2022-09-17 VITALS — BP 130/64 | HR 56 | Temp 96.7°F | Resp 18 | Ht 63.0 in | Wt 144.0 lb

## 2022-09-17 DIAGNOSIS — A419 Sepsis, unspecified organism: Secondary | ICD-10-CM | POA: Diagnosis not present

## 2022-09-17 DIAGNOSIS — N185 Chronic kidney disease, stage 5: Secondary | ICD-10-CM | POA: Diagnosis not present

## 2022-09-17 DIAGNOSIS — Z9981 Dependence on supplemental oxygen: Secondary | ICD-10-CM | POA: Diagnosis not present

## 2022-09-17 DIAGNOSIS — Z905 Acquired absence of kidney: Secondary | ICD-10-CM

## 2022-09-17 DIAGNOSIS — I5032 Chronic diastolic (congestive) heart failure: Secondary | ICD-10-CM

## 2022-09-17 DIAGNOSIS — D649 Anemia, unspecified: Secondary | ICD-10-CM | POA: Diagnosis not present

## 2022-09-17 DIAGNOSIS — I130 Hypertensive heart and chronic kidney disease with heart failure and stage 1 through stage 4 chronic kidney disease, or unspecified chronic kidney disease: Secondary | ICD-10-CM

## 2022-09-17 DIAGNOSIS — C679 Malignant neoplasm of bladder, unspecified: Secondary | ICD-10-CM

## 2022-09-17 DIAGNOSIS — I5033 Acute on chronic diastolic (congestive) heart failure: Secondary | ICD-10-CM | POA: Diagnosis not present

## 2022-09-17 DIAGNOSIS — K2100 Gastro-esophageal reflux disease with esophagitis, without bleeding: Secondary | ICD-10-CM

## 2022-09-17 DIAGNOSIS — J9611 Chronic respiratory failure with hypoxia: Secondary | ICD-10-CM

## 2022-09-17 DIAGNOSIS — N184 Chronic kidney disease, stage 4 (severe): Secondary | ICD-10-CM

## 2022-09-17 DIAGNOSIS — I110 Hypertensive heart disease with heart failure: Secondary | ICD-10-CM

## 2022-09-17 DIAGNOSIS — D631 Anemia in chronic kidney disease: Secondary | ICD-10-CM | POA: Diagnosis not present

## 2022-09-17 DIAGNOSIS — M810 Age-related osteoporosis without current pathological fracture: Secondary | ICD-10-CM

## 2022-09-17 DIAGNOSIS — J449 Chronic obstructive pulmonary disease, unspecified: Secondary | ICD-10-CM | POA: Diagnosis not present

## 2022-09-17 DIAGNOSIS — R6520 Severe sepsis without septic shock: Secondary | ICD-10-CM | POA: Diagnosis not present

## 2022-09-17 DIAGNOSIS — I132 Hypertensive heart and chronic kidney disease with heart failure and with stage 5 chronic kidney disease, or end stage renal disease: Secondary | ICD-10-CM | POA: Diagnosis not present

## 2022-09-17 MED ORDER — TORSEMIDE 20 MG PO TABS: 20.0000 mg | ORAL_TABLET | Freq: Every day | ORAL | 2 refills | Status: DC | PRN

## 2022-09-17 NOTE — Patient Instructions (Signed)
Visit Information  Thank you for taking time to visit with me today. Please don't hesitate to contact me if I can be of assistance to you.   Following are the goals we discussed today:   Goals Addressed               This Visit's Progress     Patient stated she uses a walker to help her walk. she has some stress related to caring for the needs of her spouse (pt-stated)        Interventions:  LCSW talked with client about client needs LCSW discussed ambulation of client with [name]. Client uses a walker to help her walk Discussed medication procurement. Discussed sleeping issues. She said she sometimes has difficulty sleeping Discussed appetite Discussed family support. She has some support from her son and from her daughter.  Discussed transport needs. Kylianna said she has a friend who transports her to and from her appointments Reminded Karenza of program support with RN, Pharmacist, and LCSW Encouraged client to call LCSW as needed for SW support at [phone].    LCSW to call client as scheduled.  Please call the care guide team at [phone] if you need to cancel or reschedule your appointment.   If you are experiencing a Mental Health or Behavioral Health Crisis or need someone to talk to, please go to [facility] [address] [place] [phone])   The patient verbalized understanding of instructions, educational materials, and care plan provided today and DECLINED offer to receive copy of patient instructions, educational materials, and care plan.   The patient has been provided with contact information for the care management team and has been advised to call with any health related questions or concerns.   Kelton Pillar.Jylan Loeza MSW, LCSW Licensed Visual merchandiser [facility] Care Management [phone]

## 2022-09-17 NOTE — Patient Outreach (Signed)
  Care Coordination   Follow Up Visit Note   09/17/2022 Name: Alexandra [name] MRN: [id number] DOB: Aug 29, 1940  Alexandra [name] is a 82 y.o. year old female who sees Cox, Fritzi Mandes, MD for primary care. I spoke with  [name] by phone today.  What matters to the patients health and wellness today?  Client said she uses a walker to help her walk.  She has some stress related to caring for needs of her spouse.     Goals Addressed               This Visit's Progress     Patient stated she uses a walker to help her walk. she has some stress related to caring for the needs of her spouse (pt-stated)        Interventions:  LCSW talked with client about client needs LCSW discussed ambulation of client with [name]. Client uses a walker to help her walk Discussed medication procurement. Discussed sleeping issues. She said she sometimes has difficulty sleeping Discussed appetite Discussed family support. She has some support from her son and from her daughter.  Discussed transport needs. Wynee said she has a friend who transports her to and from her appointments Reminded Tynae of program support with RN, Pharmacist, and LCSW Encouraged client to call LCSW as needed for SW support at [phone].     SDOH assessments and interventions completed:  Yes     Care Coordination Interventions:  Yes, provided    Interventions Today    Flowsheet Row Most Recent Value  Chronic Disease   Chronic disease during today's visit Other  [LCSW spoke with client about client needs]  General Interventions   General Interventions Discussed/Reviewed General Interventions Discussed, [employer]  [discussed program support]  Exercise Interventions   Exercise Discussed/Reviewed Physical Activity  Education Interventions   Education Provided Provided Education  Provided Verbal Education On [employer]  Mental Health Interventions   Mental Health Discussed/Reviewed  Coping Strategies, Anxiety  Pharmacy Interventions   Pharmacy Dicussed/Reviewed Pharmacy Topics Discussed  Safety Interventions   Safety Discussed/Reviewed Fall Risk       Follow up plan:  LCSW will call client as scheduled.    Encounter Outcome:  Pt. Visit Completed   Kelton Pillar.Marciano Mundt MSW, LCSW Licensed Visual merchandiser [facility] Care Management [phone]

## 2022-09-18 ENCOUNTER — Other Ambulatory Visit: Payer: Self-pay

## 2022-09-18 ENCOUNTER — Telehealth: Payer: Self-pay

## 2022-09-18 ENCOUNTER — Other Ambulatory Visit: Payer: Medicare Other

## 2022-09-18 DIAGNOSIS — E875 Hyperkalemia: Secondary | ICD-10-CM | POA: Diagnosis not present

## 2022-09-18 LAB — COMPREHENSIVE METABOLIC PANEL
ALT: 7 IU/L (ref 0–32)
AST: 16 IU/L (ref 0–40)
Albumin/Globulin Ratio: 1.6 (ref 1.2–2.2)
Albumin: 3.9 g/dL (ref 3.8–4.8)
Alkaline Phosphatase: 89 IU/L (ref 44–121)
BUN/Creatinine Ratio: 15 (ref 12–28)
BUN: 54 mg/dL — ABNORMAL HIGH (ref 8–27)
Bilirubin Total: 0.2 mg/dL (ref 0.0–1.2)
CO2: 20 mmol/L (ref 20–29)
Calcium: 9.6 mg/dL (ref 8.7–10.3)
Chloride: 102 mmol/L (ref 96–106)
Creatinine, Ser: 3.62 mg/dL — ABNORMAL HIGH (ref 0.57–1.00)
Globulin, Total: 2.4 g/dL (ref 1.5–4.5)
Glucose: 102 mg/dL — ABNORMAL HIGH (ref 70–99)
Potassium: 6.5 mmol/L (ref 3.5–5.2)
Sodium: 139 mmol/L (ref 134–144)
Total Protein: 6.3 g/dL (ref 6.0–8.5)
eGFR: 12 mL/min/{1.73_m2} — ABNORMAL LOW (ref >59)

## 2022-09-18 MED ORDER — SODIUM POLYSTYRENE SULFONATE PO POWD: ORAL | 0 refills | Status: DC

## 2022-09-18 NOTE — Telephone Encounter (Signed)
Per Dr. Sedalia Muta, contacted patient regarding her potassium being elevated and the need to recheck it. Patient agreed to come to office today for lab. Order placed.

## 2022-09-18 NOTE — Progress Notes (Signed)
Liver function normal. Kidney function abnormal. Stable. Potassium 6.5. patient came in for stat potassium earlier today.

## 2022-09-19 DIAGNOSIS — R6520 Severe sepsis without septic shock: Secondary | ICD-10-CM | POA: Diagnosis not present

## 2022-09-19 DIAGNOSIS — A419 Sepsis, unspecified organism: Secondary | ICD-10-CM | POA: Diagnosis not present

## 2022-09-19 DIAGNOSIS — I5033 Acute on chronic diastolic (congestive) heart failure: Secondary | ICD-10-CM | POA: Diagnosis not present

## 2022-09-19 DIAGNOSIS — I132 Hypertensive heart and chronic kidney disease with heart failure and with stage 5 chronic kidney disease, or end stage renal disease: Secondary | ICD-10-CM | POA: Diagnosis not present

## 2022-09-19 DIAGNOSIS — N185 Chronic kidney disease, stage 5: Secondary | ICD-10-CM | POA: Diagnosis not present

## 2022-09-19 DIAGNOSIS — D631 Anemia in chronic kidney disease: Secondary | ICD-10-CM | POA: Diagnosis not present

## 2022-09-20 ENCOUNTER — Other Ambulatory Visit: Payer: Self-pay

## 2022-09-20 DIAGNOSIS — R6520 Severe sepsis without septic shock: Secondary | ICD-10-CM | POA: Diagnosis not present

## 2022-09-20 DIAGNOSIS — N185 Chronic kidney disease, stage 5: Secondary | ICD-10-CM | POA: Diagnosis not present

## 2022-09-20 DIAGNOSIS — A419 Sepsis, unspecified organism: Secondary | ICD-10-CM | POA: Diagnosis not present

## 2022-09-20 DIAGNOSIS — E875 Hyperkalemia: Secondary | ICD-10-CM

## 2022-09-20 DIAGNOSIS — D631 Anemia in chronic kidney disease: Secondary | ICD-10-CM | POA: Diagnosis not present

## 2022-09-20 DIAGNOSIS — I132 Hypertensive heart and chronic kidney disease with heart failure and with stage 5 chronic kidney disease, or end stage renal disease: Secondary | ICD-10-CM | POA: Diagnosis not present

## 2022-09-20 DIAGNOSIS — I5033 Acute on chronic diastolic (congestive) heart failure: Secondary | ICD-10-CM | POA: Diagnosis not present

## 2022-09-21 ENCOUNTER — Other Ambulatory Visit: Payer: Medicare Other

## 2022-09-21 ENCOUNTER — Other Ambulatory Visit: Payer: Self-pay

## 2022-09-21 DIAGNOSIS — E875 Hyperkalemia: Secondary | ICD-10-CM

## 2022-09-21 LAB — METHYLMALONIC ACID, SERUM: Methylmalonic Acid: 2076 nmol/L — ABNORMAL HIGH (ref 0–378)

## 2022-09-21 LAB — COMPREHENSIVE METABOLIC PANEL
ALT: 7 IU/L (ref 0–32)
ALT: 9 IU/L (ref 0–32)
AST: 15 IU/L (ref 0–40)
AST: 14 IU/L (ref 0–40)
Albumin/Globulin Ratio: 1.4 (ref 1.2–2.2)
Albumin/Globulin Ratio: 1.8 (ref 1.2–2.2)
Albumin: 3.9 g/dL (ref 3.8–4.8)
Albumin: 3.8 g/dL (ref 3.8–4.8)
Alkaline Phosphatase: 92 IU/L (ref 44–121)
Alkaline Phosphatase: 84 IU/L (ref 44–121)
BUN/Creatinine Ratio: 14 (ref 12–28)
BUN/Creatinine Ratio: 14 (ref 12–28)
BUN: 53 mg/dL — ABNORMAL HIGH (ref 8–27)
BUN: 58 mg/dL — ABNORMAL HIGH (ref 8–27)
Bilirubin Total: 0.3 mg/dL (ref 0.0–1.2)
Bilirubin Total: 0.2 mg/dL (ref 0.0–1.2)
CO2: 18 mmol/L — ABNORMAL LOW (ref 20–29)
CO2: 23 mmol/L (ref 20–29)
Calcium: 9.6 mg/dL (ref 8.7–10.3)
Calcium: 8.1 mg/dL — ABNORMAL LOW (ref 8.7–10.3)
Chloride: 102 mmol/L (ref 96–106)
Chloride: 101 mmol/L (ref 96–106)
Creatinine, Ser: 3.87 mg/dL — ABNORMAL HIGH (ref 0.57–1.00)
Creatinine, Ser: 4.01 mg/dL — ABNORMAL HIGH (ref 0.57–1.00)
Globulin, Total: 2.8 g/dL (ref 1.5–4.5)
Globulin, Total: 2.1 g/dL (ref 1.5–4.5)
Glucose: 90 mg/dL (ref 70–99)
Glucose: 96 mg/dL (ref 70–99)
Potassium: 6.7 mmol/L (ref 3.5–5.2)
Potassium: 4.6 mmol/L (ref 3.5–5.2)
Sodium: 138 mmol/L (ref 134–144)
Sodium: 139 mmol/L (ref 134–144)
Total Protein: 6.7 g/dL (ref 6.0–8.5)
Total Protein: 5.9 g/dL — ABNORMAL LOW (ref 6.0–8.5)
eGFR: 11 mL/min/{1.73_m2} — ABNORMAL LOW (ref >59)
eGFR: 11 mL/min/{1.73_m2} — ABNORMAL LOW (ref >59)

## 2022-09-21 LAB — IRON,TIBC AND FERRITIN PANEL
Ferritin: 439 ng/mL — ABNORMAL HIGH (ref 15–150)
Iron Saturation: 27 % (ref 15–55)
Iron: 69 ug/dL (ref 27–139)
Total Iron Binding Capacity: 252 ug/dL (ref 250–450)
UIBC: 183 ug/dL (ref 118–369)

## 2022-09-21 LAB — CBC WITH DIFFERENTIAL/PLATELET
Basophils Absolute: 0.1 10*3/uL (ref 0.0–0.2)
Basos: 1 %
EOS (ABSOLUTE): 0.3 10*3/uL (ref 0.0–0.4)
Eos: 3 %
Hematocrit: 27.3 % — ABNORMAL LOW (ref 34.0–46.6)
Hemoglobin: 8.7 g/dL — ABNORMAL LOW (ref 11.1–15.9)
Immature Grans (Abs): 0.1 10*3/uL (ref 0.0–0.1)
Immature Granulocytes: 1 %
Lymphocytes Absolute: 2.4 10*3/uL (ref 0.7–3.1)
Lymphs: 24 %
MCH: 29.8 pg (ref 26.6–33.0)
MCHC: 31.9 g/dL (ref 31.5–35.7)
MCV: 94 fL (ref 79–97)
Monocytes Absolute: 0.9 10*3/uL (ref 0.1–0.9)
Monocytes: 9 %
Neutrophils Absolute: 6.2 10*3/uL (ref 1.4–7.0)
Neutrophils: 62 %
Platelets: 298 10*3/uL (ref 150–450)
RBC: 2.92 x10E6/uL — ABNORMAL LOW (ref 3.77–5.28)
RDW: 15.7 % — ABNORMAL HIGH (ref 11.7–15.4)
WBC: 9.9 10*3/uL (ref 3.4–10.8)

## 2022-09-21 LAB — B12 AND FOLATE PANEL
Folate: 3.1 ng/mL (ref >3.0)
Vitamin B-12: 273 pg/mL (ref 232–1245)

## 2022-09-21 LAB — TSH: TSH: 15.6 u[IU]/mL — ABNORMAL HIGH (ref 0.450–4.500)

## 2022-09-23 DIAGNOSIS — N3289 Other specified disorders of bladder: Secondary | ICD-10-CM | POA: Diagnosis not present

## 2022-09-23 DIAGNOSIS — M25551 Pain in right hip: Secondary | ICD-10-CM | POA: Diagnosis not present

## 2022-09-23 DIAGNOSIS — K5730 Diverticulosis of large intestine without perforation or abscess without bleeding: Secondary | ICD-10-CM | POA: Diagnosis not present

## 2022-09-23 DIAGNOSIS — R102 Pelvic and perineal pain: Secondary | ICD-10-CM | POA: Diagnosis not present

## 2022-09-23 DIAGNOSIS — N390 Urinary tract infection, site not specified: Secondary | ICD-10-CM | POA: Diagnosis not present

## 2022-09-23 DIAGNOSIS — Z79899 Other long term (current) drug therapy: Secondary | ICD-10-CM | POA: Diagnosis not present

## 2022-09-24 DIAGNOSIS — I132 Hypertensive heart and chronic kidney disease with heart failure and with stage 5 chronic kidney disease, or end stage renal disease: Secondary | ICD-10-CM | POA: Diagnosis not present

## 2022-09-24 DIAGNOSIS — I5033 Acute on chronic diastolic (congestive) heart failure: Secondary | ICD-10-CM | POA: Diagnosis not present

## 2022-09-24 DIAGNOSIS — R6520 Severe sepsis without septic shock: Secondary | ICD-10-CM | POA: Diagnosis not present

## 2022-09-24 DIAGNOSIS — A419 Sepsis, unspecified organism: Secondary | ICD-10-CM | POA: Diagnosis not present

## 2022-09-24 DIAGNOSIS — N185 Chronic kidney disease, stage 5: Secondary | ICD-10-CM | POA: Diagnosis not present

## 2022-09-24 DIAGNOSIS — D631 Anemia in chronic kidney disease: Secondary | ICD-10-CM | POA: Diagnosis not present

## 2022-09-25 ENCOUNTER — Telehealth: Payer: Self-pay

## 2022-09-25 DIAGNOSIS — I132 Hypertensive heart and chronic kidney disease with heart failure and with stage 5 chronic kidney disease, or end stage renal disease: Secondary | ICD-10-CM | POA: Diagnosis not present

## 2022-09-25 DIAGNOSIS — D631 Anemia in chronic kidney disease: Secondary | ICD-10-CM | POA: Diagnosis not present

## 2022-09-25 DIAGNOSIS — N185 Chronic kidney disease, stage 5: Secondary | ICD-10-CM | POA: Diagnosis not present

## 2022-09-25 DIAGNOSIS — R6520 Severe sepsis without septic shock: Secondary | ICD-10-CM | POA: Diagnosis not present

## 2022-09-25 DIAGNOSIS — I5033 Acute on chronic diastolic (congestive) heart failure: Secondary | ICD-10-CM | POA: Diagnosis not present

## 2022-09-25 DIAGNOSIS — A419 Sepsis, unspecified organism: Secondary | ICD-10-CM | POA: Diagnosis not present

## 2022-09-25 NOTE — Telephone Encounter (Signed)
Patient was discharge from OT yesterday, stated he is having con because she is still electrolyte problems. Stated patient was in [place] hospital Sunday with low magnesium, stated she takes iron, potassium and sodium but does not take magnesium. Also stated that certain food smells makes her vomit her husband had french fries yesterday and it made her vomit just smelling them. Kathlene November can be reach at [phone]

## 2022-09-26 ENCOUNTER — Ambulatory Visit: Payer: Self-pay | Admitting: Licensed Clinical Social Worker

## 2022-09-26 ENCOUNTER — Other Ambulatory Visit: Payer: Self-pay

## 2022-09-26 ENCOUNTER — Telehealth: Payer: Self-pay

## 2022-09-26 MED ORDER — SYNTHROID 50 MCG PO TABS: 50.0000 ug | ORAL_TABLET | Freq: Every day | ORAL | 1 refills | Status: DC

## 2022-09-26 MED ORDER — CYANOCOBALAMIN 1000 MCG/ML IJ SOLN: 1000.0000 ug | INTRAMUSCULAR | 1 refills | Status: DC

## 2022-09-26 NOTE — Patient Outreach (Signed)
  Care Coordination   Follow Up Visit Note   09/26/2022 Name: Alexandra [name] MRN: [id number] DOB: July 14, 1940  Alexandra [name] is a 82 y.o. year old female who sees Cox, Fritzi Mandes, MD for primary care. I spoke with  [name] by phone today.  What matters to the patients health and wellness today? Patient is trying to manage her medical needs and manage  needs of her spouse.     Goals Addressed             This Visit's Progress    patient trying to manage her medical needs and manage needs of her spouse       Interventions:  LCSW spoke with client about client needs LCSW spoke with Talbert Forest about financial needs and medical needs. Discussed her caring for the needs of her spouse Discussed family support. She said she has some support from her daughter and son. They both work during the week Discussed medication procurement Provided counseling support Discussed client ambulation. She uses rollator walker to help her ambulate.  She said it is difficult to stand for a long period of time. She takes rest breaks as needed Reviewed oxygen use. She said she does not use oxygen during day. She uses oxygen at night to help her with breathing Discussed transport. She said a friend drives her to and from her medical appointments Encouraged client to call LCSW as needed for SW support at [phone]     SDOH assessments and interventions completed:  Yes  SDOH Interventions Today    Flowsheet Row Most Recent Value  SDOH Interventions   Financial Strain Interventions Other (Comment)  [financial strain,  occasional difficulty paying bills or buying basic food items]  Physical Activity Interventions Other (Comments)  [walking challenges,  uses rollator walker to help her ambulate]  Stress Interventions Provide Counseling  [stress over managing  medical needs. stress over financial needs]        Care Coordination Interventions:  Yes, provided    Interventions Today    Flowsheet  Row Most Recent Value  Chronic Disease   Chronic disease during today's visit Other  [spoke via phone with client about client needs: financial needs, medical needs]  General Interventions   General Interventions Discussed/Reviewed General Interventions Discussed, [employer]  [discussed program support with RN, LCSW, Pharmacist]  Exercise Interventions   Exercise Discussed/Reviewed Physical Activity  [uses rollator walker to help her ambulate]  Physical Activity Discussed/Reviewed Physical Activity Reviewed  Education Interventions   Education Provided Provided Education  Provided Verbal Education On [employer] Resources  Mental Health Interventions   Mental Health Discussed/Reviewed Anxiety, Coping Strategies  [discussed anxiety or stress issues,  discussed financial needs]  Nutrition Interventions   Nutrition Discussed/Reviewed Nutrition Discussed  Pharmacy Interventions   Pharmacy Dicussed/Reviewed Pharmacy Topics Discussed  Safety Interventions   Safety Discussed/Reviewed Fall Risk       Follow up plan: LCSW to call client as scheduled to assess client needs   Encounter Outcome:  Pt. Visit Completed   Kelton Pillar.Deania Siguenza MSW, LCSW Licensed Visual merchandiser [facility] Care Management [phone]

## 2022-09-26 NOTE — Patient Instructions (Signed)
Visit Information  Thank you for taking time to visit with me today. Please don't hesitate to contact me if I can be of assistance to you.   Following are the goals we discussed today:   Goals Addressed             This Visit's Progress    patient trying to manage her medical needs and manage needs of her spouse       Interventions:  LCSW spoke with client about client needs LCSW spoke with Talbert Forest about financial needs and medical needs. Discussed her caring for the needs of her spouse Discussed family support. She said she has some support from her daughter and son. They both work during the week Discussed medication procurement Provided counseling support Discussed client ambulation. She uses rollator walker to help her ambulate.  She said it is difficult to stand for a long period of time. She takes rest breaks as needed Reviewed oxygen use. She said she does not use oxygen during day. She uses oxygen at night to help her with breathing Discussed transport. She said a friend drives her to and from her medical appointments Encouraged client to call LCSW as needed for SW support at [phone]    LCSW to call client as scheduled to assess client needs  Please call the care guide team at [phone] if you need to cancel or reschedule your appointment.   If you are experiencing a Mental Health or Behavioral Health Crisis or need someone to talk to, please go to [facility] [address] [place] [phone])   The patient verbalized understanding of instructions, educational materials, and care plan provided today and DECLINED offer to receive copy of patient instructions, educational materials, and care plan.   The patient has been provided with contact information for the care management team and has been advised to call with any health related questions or concerns.   Kelton Pillar.Jacinto Keil MSW, LCSW Licensed Visual merchandiser [facility]  Care Management [phone]

## 2022-09-26 NOTE — Telephone Encounter (Signed)
     Patient  visit on 4/14  at [facility]   Have you been able to follow up with your primary care physician? Yes   The patient was or was not able to obtain any needed medicine or equipment. Yes   Are there diet recommendations that you are having difficulty following? Na   Patient expresses understanding of discharge instructions and education provided has no other needs at this time.  Yes     Rowland Ericsson [facility] Care Guide, [place] [phone] [address], [place], [place] [address] Phone: [phone] Email: [email or web address]    

## 2022-09-28 ENCOUNTER — Other Ambulatory Visit: Payer: Self-pay | Admitting: Family Medicine

## 2022-09-28 DIAGNOSIS — I10 Essential (primary) hypertension: Secondary | ICD-10-CM

## 2022-10-01 ENCOUNTER — Telehealth: Payer: Self-pay | Admitting: Radiation Oncology

## 2022-10-01 NOTE — Telephone Encounter (Signed)
Pt's daughter called requesting that I print and mail Itemized statements for all RadOnc appts from Nov 2023 - Jan 2024..     They have been printed and mailed to [address] Box on file for pt.

## 2022-10-02 ENCOUNTER — Other Ambulatory Visit: Payer: Self-pay

## 2022-10-03 DIAGNOSIS — H524 Presbyopia: Secondary | ICD-10-CM | POA: Diagnosis not present

## 2022-10-03 DIAGNOSIS — H26491 Other secondary cataract, right eye: Secondary | ICD-10-CM | POA: Diagnosis not present

## 2022-10-04 ENCOUNTER — Other Ambulatory Visit: Payer: Self-pay

## 2022-10-04 NOTE — Progress Notes (Addendum)
Subjective:  Patient ID: Alexandra [name], female    DOB: 1941-02-23  Age: 82 y.o. MRN: [id number]  Chief Complaint  Patient presents with   Hypertension   Hyperlipidemia    HPI Patient is concerned about her weight. On 08/09/22, she was 162 lbs and today is 137 lbs. She cannot eat for nauseated with dry heaves and vomiting. She has lost 7 lbs since her last visit on 09/17/2022.  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  Osteoporosis:  vitamin D 40981 units daily.    Hyperlipidemia:  Lipitor 40 mg daily.    Bladder Cancer: sees Dr. Saddie Benders and Dr. Constance Goltz. She has stopped chemotherapy.    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   RLS: on ropinirole 2 mg twice daily.   Hypertensive heart failure: On amlodipine 10 mg daily and on torsemide 20 mg daily as needed.  Hypothyroidism: on synthroid 50 mcg once daily in am.   Chronic respiratory failure with hypoxia: on 2 L daily. On breztri 2 puffs twice daily, Has ventolin HFA.  The patient has a medical condition which requires positioning of trhe body in ways not feasible in an ordinary bed. Patient requires the head of the bed to be elevated more than 30 degrees most of the time due to copd and respiratory failure. Patient also requires frequent changes in body position.     09/17/2022    8:59 AM 08/29/2022    2:30 PM 05/30/2022   10:19 AM 04/10/2022    7:55 AM 02/15/2022    1:31 PM  Depression screen PHQ 2/9  Decreased Interest 0 0 0 0 0  Down, Depressed, Hopeless 0 0 0 0 0  PHQ - 2 Score 0 0 0 0 0  Altered sleeping   0 0   Tired, decreased energy   0 0   Change in appetite   0 0   Feeling bad or failure about yourself    0 0   Trouble concentrating   0 0   Moving slowly or fidgety/restless   0 0   Suicidal thoughts   0 0   PHQ-9 Score   0 0    Difficult doing work/chores   Not difficult at all Not difficult at all          07/17/2022    3:40 PM 08/29/2022    2:30 PM 09/04/2022    9:37 AM 09/17/2022    8:58 AM 09/26/2022   12:28 PM  Fall Risk  Falls in the past year? 0 1 1 1    Was there an injury with Fall? 0 1 1 1    Fall Risk Category Calculator 0 2 2 2    Patient at Risk for Falls Due to Impaired mobility History of fall(s);Impaired balance/gait History of fall(s);Impaired balance/gait History of fall(s);Impaired mobility Impaired balance/gait  Fall risk Follow up Falls evaluation completed;Education provided;Falls prevention discussed Falls evaluation completed  Falls evaluation completed;Falls prevention discussed       Review of Systems  Constitutional:  Negative for chills, fatigue and fever.  HENT:  Negative for congestion, ear pain and sore throat.   Respiratory:  Negative for cough and shortness of breath.   Cardiovascular:  Negative for chest pain and palpitations.  Gastrointestinal:  Positive for nausea and vomiting. Negative for abdominal pain, constipation and diarrhea.  Endocrine: Negative for polydipsia, polyphagia and polyuria.  Genitourinary:  Negative for difficulty urinating and dysuria.  Musculoskeletal:  Negative for arthralgias, back pain and myalgias.  Skin:  Negative for rash.  Neurological:  Negative for headaches.  Psychiatric/Behavioral:  Negative for dysphoric mood. The patient is not nervous/anxious.     Current Outpatient Medications on File Prior to Visit  Medication Sig Dispense Refill   Budeson-Glycopyrrol-Formoterol (BREZTRI AEROSPHERE) 160-9-4.8 MCG/ACT AERO Inhale 2 puffs into the lungs in the morning and at bedtime. 10.7 g 4   Cholecalciferol (VITAMIN D) 125 MCG (5000 UT) CAPS Take 1 capsule by mouth daily.     cyanocobalamin (VITAMIN B12) 1000 MCG/ML injection Inject 1 mL (1,000 mcg total) into the muscle once a week. 4 mL 1   ferrous sulfate 325 (65 FE) MG tablet Take 1 tablet (325 mg  total) by mouth daily. 90 tablet 0   levalbuterol (XOPENEX) 1.25 MG/3ML nebulizer solution Take 1.25 mg by nebulization 2 (two) times daily.     nitroGLYCERIN (NITROSTAT) 0.4 MG SL tablet Place 1 tablet (0.4 mg total) under the tongue every 5 (five) minutes as needed for chest pain. 30 tablet 3   ondansetron (ZOFRAN-ODT) 4 MG disintegrating tablet Take 1 tablet (4 mg total) by mouth every 8 (eight) hours as needed for nausea or vomiting. (Patient not taking: Reported on 10/29/2022) 20 tablet 0   OXYGEN Inhale 2 L into the lungs continuous. (Patient not taking: Reported on 10/29/2022)     rOPINIRole (REQUIP) 2 MG tablet TAKE 1 TABLET(2 MG) BY MOUTH IN THE MORNING AND AT BEDTIME (Patient taking differently: Take 2 mg by mouth 2 (two) times daily.) 180 tablet 6   sodium polystyrene (KAYEXALATE) powder 15 gm dose four times a day x 3 days. 454 g 0   SYNTHROID 50 MCG tablet Take 1 tablet (50 mcg total) by mouth daily before breakfast. 30 tablet 1   torsemide (DEMADEX) 20 MG tablet Take 1 tablet (20 mg total) by mouth daily as needed (edema). 30 tablet 2   traMADol (ULTRAM) 50 MG tablet Take by mouth.     VENTOLIN HFA 108 (90 Base) MCG/ACT inhaler Inhale 1-2 puffs into the lungs every 6 (six) hours as needed for wheezing or shortness of breath. 18 g 2   No current facility-administered medications on file prior to visit.   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 82 yo   BACK SURGERY  2016   lower L 4 to L 5   bladder tack and uterus  lift  10/10/2015   CHOLECYSTECTOMY     summer of 2022   CYSTOSCOPY WITH RETROGRADE PYELOGRAM, URETEROSCOPY AND STENT PLACEMENT Right 08/04/2022   Procedure: CYSTOSCOPY WITH RIGHT URETERAL STENT EXCHANGE;  Surgeon: Despina Arias, MD;  Location: [facility];  Service: Urology;  Laterality: Right;   IR RADIOLOGIST EVAL & MGMT  12/03/2018   IR RADIOLOGIST EVAL & MGMT  12/31/2018   IR REMOVAL TUN ACCESS W/ PORT W/O FL MOD SED  08/08/2022  PARATHYROIDECTOMY Left 07/02/2016   Procedure: LEFT INFERIOR PARATHYROIDECTOMY;  Surgeon: Darnell Level, MD;  Location: [facility] ORS;  Service: General;  Laterality: Left;   SHOULDER OPEN ROTATOR CUFF REPAIR  12/2019   TEE WITHOUT CARDIOVERSION N/A 08/10/2022   Procedure: TRANSESOPHAGEAL ECHOCARDIOGRAM (TEE);  Surgeon: Chrystie Nose, MD;  Location: [facility] ENDOSCOPY;  Service: Cardiovascular;  Laterality: N/A;   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    Family History  Problem Relation Age of Onset   Emphysema Mother    Hyperlipidemia Sister    Hypertension Sister    COPD Sister    Hyperlipidemia Sister    Hypertension Sister    Lymphoma Sister    Obesity Brother    Hyperlipidemia Brother    Hypertension Brother    Breast cancer Neg Hx    Social History   Socioeconomic History   Marital status: Married    Spouse name: Molly Maduro   Number of children: 2   Years of education: 11   Highest education level: 11th grade  Occupational History   Occupation: Retired  Tobacco Use   Smoking status: Former    Packs/day: 1.00    Years: 50.00    Additional pack years: 0.00    Total pack years: 50.00    Types: Cigarettes    Quit date: 06/11/2005    Years since quitting: 17.4   Smokeless tobacco: Never  Vaping Use   Vaping Use: Never used  Substance and Sexual Activity   Alcohol use: No   Drug use: No   Sexual activity: Yes    Partners: Male  Other Topics Concern   Not on file  Social History Narrative   Not on file   Social Determinants of Health    Financial Resource Strain: Medium Risk (09/26/2022)   Overall Financial Resource Strain (CARDIA)    Difficulty of Paying Living Expenses: Somewhat hard  Food Insecurity: No Food Insecurity (08/14/2022)   Hunger Vital Sign    Worried About Running Out of Food in the Last Year: Never true    Ran Out of Food in the Last Year: Never true  Transportation Needs: No Transportation Needs (08/14/2022)   PRAPARE - Administrator, Civil Service (Medical): No    Lack of Transportation (Non-Medical): No  Physical Activity: Inactive (10/16/2022)   Exercise Vital Sign    Days of Exercise per Week: 0 days    Minutes of Exercise per Session: 0 min  Stress: Stress Concern Present (10/16/2022)   [employer] of Occupational Health - Occupational Stress Questionnaire    Feeling of Stress : To some extent  Social Connections: Moderately Integrated (02/15/2022)   Social Connection and Isolation Panel [NHANES]    Frequency of Communication with Friends and Family: More than three times a week    Frequency of Social Gatherings with Friends and Family: More than three times a week    Attends Religious Services: More than 4 times per year    Active Member of [employer] or Organizations: No    Attends Engineer, structural: Never    Marital Status: Married    Objective:  BP 130/68   Pulse 84   Temp (!) 97 F (36.1 C)   Resp 16   Ht 5\' 2"  (1.575 m)   Wt 137 lb (62.1 kg)   SpO2 97%   BMI 25.06 kg/m      10/29/2022    8:20 AM 10/05/2022    9:05 AM 09/17/2022  8:52 AM  BP/Weight  Systolic BP 148 130 130  Diastolic BP 78 68 64  Wt. (Lbs) 141.6 137 144  BMI 25.9 kg/m2 25.06 kg/m2 25.51 kg/m2    Physical Exam Vitals reviewed.  Constitutional:      Appearance: Normal appearance. She is normal weight.  Neck:     Vascular: No carotid bruit.  Cardiovascular:     Rate and Rhythm: Normal rate and regular rhythm.     Heart sounds: Normal heart sounds.  Pulmonary:     Effort: Pulmonary  effort is normal. No respiratory distress.     Breath sounds: Normal breath sounds.  Abdominal:     General: Abdomen is flat. Bowel sounds are normal.     Palpations: Abdomen is soft.     Tenderness: There is no abdominal tenderness.  Neurological:     Mental Status: She is alert and oriented to person, place, and time.  Psychiatric:        Mood and Affect: Mood normal.        Behavior: Behavior normal.     Diabetic Foot Exam - Simple   No data filed      Lab Results  Component Value Date   WBC 8.3 10/05/2022   HGB 9.2 (L) 10/05/2022   HCT 28.3 (L) 10/05/2022   PLT 337 10/05/2022   GLUCOSE 90 10/15/2022   CHOL 205 (H) 10/05/2022   TRIG 100 10/05/2022   HDL 68 10/05/2022   LDLCALC 119 (H) 10/05/2022   ALT 12 10/15/2022   AST 16 10/15/2022   NA 140 10/15/2022   K 5.2 10/15/2022   CL 107 (H) 10/15/2022   CREATININE 3.74 (H) 10/15/2022   BUN 50 (H) 10/15/2022   CO2 18 (L) 10/15/2022   TSH 15.600 (H) 09/17/2022   INR 1.1 08/04/2022   HGBA1C 5.6 04/04/2021      Assessment & Plan:    Hypertensive heart disease with chronic diastolic congestive heart failure (HCC) Assessment & Plan: Continue amlodipine 10 mg daily.  Continue torsemide 20 mg daily as needed.   Orders: -     Comprehensive metabolic panel -     CBC with Differential/Platelet -     Cardiovascular Risk Assessment  Chronic respiratory failure with hypoxia (HCC) Assessment & Plan: Continue oxygen 2 L daily.  The patient has a medical condition which requires positioning of trhe body in ways not feasible in an ordinary bed. Patient requires the head of the bed to be elevated more than 30 degrees most of the time due to copd and respiratory failure. Patient also requires frequent changes in body position. Please provide patient with semi electronic  hospital bed.     Gastroesophageal reflux disease, unspecified whether esophagitis present Assessment & Plan: The current medical regimen is effective;   continue present plan and medications. Continue pantoprazole 40 mg daily.    Mixed hyperlipidemia Assessment & Plan: Well controlled.  No changes to medicines. Continue lipitor 40 mg before bed.  Continue to work on eating a healthy diet and exercise.  Labs drawn today.    Orders: -     Lipid panel  B12 deficiency -     Cyanocobalamin  Age-related osteoporosis without current pathological fracture Assessment & Plan: Continue vitamin D 50K daily.   Anemia due to stage 5 chronic kidney disease, not on chronic dialysis [facility]) Assessment & Plan: Management per specialist.  Worsening. Cause of recurrent hyperkalemia.   Malignant neoplasm of urinary bladder, unspecified site [facility]) Assessment & Plan:  Desires no further treatment with chemo or radiation.  I discussed with Dr. Angelene Giovanni and he is happy to see her to help with other issues such as anemia.    Chronic kidney disease, stage V [facility]) Assessment & Plan: Management per nephrology.     COPD, moderate (HCC) Assessment & Plan: The current medical regimen is effective;  continue present plan and medications. Continue inhalers.    Nausea and vomiting, unspecified vomiting type -     Ondansetron HCl; Take 1 tablet (8 mg total) by mouth every 8 (eight) hours as needed for nausea or vomiting.  Dispense: 90 tablet; Refill: 0     Meds ordered this encounter  Medications   cyanocobalamin (VITAMIN B12) injection 1,000 mcg   DISCONTD: pantoprazole (PROTONIX) 40 MG tablet    Sig: Take 1 tablet (40 mg total) by mouth daily.    Dispense:  30 tablet    Refill:  0   ondansetron (ZOFRAN) 8 MG tablet    Sig: Take 1 tablet (8 mg total) by mouth every 8 (eight) hours as needed for nausea or vomiting.    Dispense:  90 tablet    Refill:  0    Orders Placed This Encounter  Procedures   Comprehensive metabolic panel   Lipid panel   CBC with Differential/Platelet   Cardiovascular Risk Assessment     Follow-up: No follow-ups  on file.   I,Marla I Leal-Borjas,acting as a scribe for Blane Ohara, MD.,have documented all relevant documentation on the behalf of Blane Ohara, MD,as directed by  Blane Ohara, MD while in the presence of Blane Ohara, MD.   An After Visit Summary was printed and given to the patient.  Blane Ohara, MD [facility] [phone]

## 2022-10-05 ENCOUNTER — Ambulatory Visit (INDEPENDENT_AMBULATORY_CARE_PROVIDER_SITE_OTHER): Payer: Medicare Other | Admitting: Family Medicine

## 2022-10-05 ENCOUNTER — Other Ambulatory Visit: Payer: Self-pay | Admitting: Family Medicine

## 2022-10-05 ENCOUNTER — Encounter: Payer: Self-pay | Admitting: Family Medicine

## 2022-10-05 VITALS — BP 130/68 | HR 84 | Temp 97.0°F | Resp 16 | Ht 62.0 in | Wt 137.0 lb

## 2022-10-05 DIAGNOSIS — I5032 Chronic diastolic (congestive) heart failure: Secondary | ICD-10-CM

## 2022-10-05 DIAGNOSIS — M810 Age-related osteoporosis without current pathological fracture: Secondary | ICD-10-CM | POA: Diagnosis not present

## 2022-10-05 DIAGNOSIS — R112 Nausea with vomiting, unspecified: Secondary | ICD-10-CM

## 2022-10-05 DIAGNOSIS — E538 Deficiency of other specified B group vitamins: Secondary | ICD-10-CM

## 2022-10-05 DIAGNOSIS — D631 Anemia in chronic kidney disease: Secondary | ICD-10-CM | POA: Diagnosis not present

## 2022-10-05 DIAGNOSIS — E782 Mixed hyperlipidemia: Secondary | ICD-10-CM

## 2022-10-05 DIAGNOSIS — J9611 Chronic respiratory failure with hypoxia: Secondary | ICD-10-CM | POA: Diagnosis not present

## 2022-10-05 DIAGNOSIS — K219 Gastro-esophageal reflux disease without esophagitis: Secondary | ICD-10-CM | POA: Diagnosis not present

## 2022-10-05 DIAGNOSIS — C679 Malignant neoplasm of bladder, unspecified: Secondary | ICD-10-CM | POA: Diagnosis not present

## 2022-10-05 DIAGNOSIS — I110 Hypertensive heart disease with heart failure: Secondary | ICD-10-CM | POA: Diagnosis not present

## 2022-10-05 DIAGNOSIS — I132 Hypertensive heart and chronic kidney disease with heart failure and with stage 5 chronic kidney disease, or end stage renal disease: Secondary | ICD-10-CM | POA: Diagnosis not present

## 2022-10-05 DIAGNOSIS — N185 Chronic kidney disease, stage 5: Secondary | ICD-10-CM

## 2022-10-05 DIAGNOSIS — J449 Chronic obstructive pulmonary disease, unspecified: Secondary | ICD-10-CM | POA: Diagnosis not present

## 2022-10-05 MED ORDER — CYANOCOBALAMIN 1000 MCG/ML IJ SOLN
1000.0000 ug | Freq: Once | INTRAMUSCULAR | Status: AC
Start: 2022-10-05 — End: 2022-10-05
  Administered 2022-10-05: 1000 ug via INTRAMUSCULAR

## 2022-10-05 MED ORDER — ONDANSETRON HCL 8 MG PO TABS
8.0000 mg | ORAL_TABLET | Freq: Three times a day (TID) | ORAL | 0 refills | Status: DC | PRN
Start: 2022-10-05 — End: 2023-01-21

## 2022-10-05 MED ORDER — PANTOPRAZOLE SODIUM 40 MG PO TBEC: 40.0000 mg | DELAYED_RELEASE_TABLET | Freq: Every day | ORAL | 0 refills | Status: DC

## 2022-10-05 NOTE — Patient Instructions (Signed)
Increase ondansetron to 8 mg three times a day as needed nausea.  Start pantoprazole 40 mg once daily.

## 2022-10-06 DIAGNOSIS — I509 Heart failure, unspecified: Secondary | ICD-10-CM | POA: Diagnosis not present

## 2022-10-06 DIAGNOSIS — N189 Chronic kidney disease, unspecified: Secondary | ICD-10-CM | POA: Diagnosis not present

## 2022-10-06 DIAGNOSIS — R9431 Abnormal electrocardiogram [ECG] [EKG]: Secondary | ICD-10-CM | POA: Diagnosis not present

## 2022-10-06 DIAGNOSIS — I129 Hypertensive chronic kidney disease with stage 1 through stage 4 chronic kidney disease, or unspecified chronic kidney disease: Secondary | ICD-10-CM | POA: Diagnosis not present

## 2022-10-06 DIAGNOSIS — I10 Essential (primary) hypertension: Secondary | ICD-10-CM | POA: Diagnosis not present

## 2022-10-06 DIAGNOSIS — E875 Hyperkalemia: Secondary | ICD-10-CM | POA: Diagnosis not present

## 2022-10-06 DIAGNOSIS — J449 Chronic obstructive pulmonary disease, unspecified: Secondary | ICD-10-CM | POA: Diagnosis not present

## 2022-10-06 LAB — CBC WITH DIFFERENTIAL/PLATELET
Basophils Absolute: 0.1 10*3/uL (ref 0.0–0.2)
Basos: 1 %
EOS (ABSOLUTE): 0.2 10*3/uL (ref 0.0–0.4)
Eos: 3 %
Hematocrit: 28.3 % — ABNORMAL LOW (ref 34.0–46.6)
Hemoglobin: 9.2 g/dL — ABNORMAL LOW (ref 11.1–15.9)
Immature Grans (Abs): 0.1 10*3/uL (ref 0.0–0.1)
Immature Granulocytes: 1 %
Lymphocytes Absolute: 2.5 10*3/uL (ref 0.7–3.1)
Lymphs: 31 %
MCH: 29.7 pg (ref 26.6–33.0)
MCHC: 32.5 g/dL (ref 31.5–35.7)
MCV: 91 fL (ref 79–97)
Monocytes Absolute: 0.6 10*3/uL (ref 0.1–0.9)
Monocytes: 7 %
Neutrophils Absolute: 4.8 10*3/uL (ref 1.4–7.0)
Neutrophils: 57 %
Platelets: 337 10*3/uL (ref 150–450)
RBC: 3.1 x10E6/uL — ABNORMAL LOW (ref 3.77–5.28)
RDW: 14.5 % (ref 11.7–15.4)
WBC: 8.3 10*3/uL (ref 3.4–10.8)

## 2022-10-06 LAB — COMPREHENSIVE METABOLIC PANEL
ALT: 18 IU/L (ref 0–32)
AST: 22 IU/L (ref 0–40)
Albumin/Globulin Ratio: 1.6 (ref 1.2–2.2)
Albumin: 4.3 g/dL (ref 3.8–4.8)
Alkaline Phosphatase: 93 IU/L (ref 44–121)
BUN/Creatinine Ratio: 11 — ABNORMAL LOW (ref 12–28)
BUN: 42 mg/dL — ABNORMAL HIGH (ref 8–27)
Bilirubin Total: <0.2 mg/dL (ref 0.0–1.2)
CO2: 16 mmol/L — ABNORMAL LOW (ref 20–29)
Calcium: 9.4 mg/dL (ref 8.7–10.3)
Chloride: 102 mmol/L (ref 96–106)
Creatinine, Ser: 3.94 mg/dL — ABNORMAL HIGH (ref 0.57–1.00)
Globulin, Total: 2.7 g/dL (ref 1.5–4.5)
Glucose: 92 mg/dL (ref 70–99)
Potassium: 7.5 mmol/L (ref 3.5–5.2)
Sodium: 135 mmol/L (ref 134–144)
Total Protein: 7 g/dL (ref 6.0–8.5)
eGFR: 11 mL/min/{1.73_m2} — ABNORMAL LOW (ref >59)

## 2022-10-06 LAB — LIPID PANEL
Chol/HDL Ratio: 3 ratio (ref 0.0–4.4)
Cholesterol, Total: 205 mg/dL — ABNORMAL HIGH (ref 100–199)
HDL: 68 mg/dL (ref >39)
LDL Chol Calc (NIH): 119 mg/dL — ABNORMAL HIGH (ref 0–99)
Triglycerides: 100 mg/dL (ref 0–149)
VLDL Cholesterol Cal: 18 mg/dL (ref 5–40)

## 2022-10-06 LAB — LAB REPORT - SCANNED: EGFR: 12

## 2022-10-06 LAB — CARDIOVASCULAR RISK ASSESSMENT

## 2022-10-07 ENCOUNTER — Encounter: Payer: Self-pay | Admitting: Family Medicine

## 2022-10-07 DIAGNOSIS — I10 Essential (primary) hypertension: Secondary | ICD-10-CM | POA: Diagnosis not present

## 2022-10-07 DIAGNOSIS — E875 Hyperkalemia: Secondary | ICD-10-CM | POA: Diagnosis not present

## 2022-10-07 DIAGNOSIS — I509 Heart failure, unspecified: Secondary | ICD-10-CM | POA: Diagnosis not present

## 2022-10-07 DIAGNOSIS — D649 Anemia, unspecified: Secondary | ICD-10-CM | POA: Insufficient documentation

## 2022-10-07 LAB — BASIC METABOLIC PANEL: EGFR: 12

## 2022-10-07 NOTE — Assessment & Plan Note (Signed)
I will discuss with oncology.

## 2022-10-07 NOTE — Assessment & Plan Note (Signed)
Management per specialist.  Worsening. Cause of recurrent hyperkalemia.

## 2022-10-07 NOTE — Assessment & Plan Note (Signed)
Check labs 

## 2022-10-07 NOTE — Assessment & Plan Note (Signed)
Fair control. Continue torsemide 20 mg twice daily.

## 2022-10-07 NOTE — Assessment & Plan Note (Signed)
The current medical regimen is effective;  continue present plan and medications. °Continue pantoprazole 40 mg daily  °

## 2022-10-07 NOTE — Assessment & Plan Note (Signed)
Well controlled.  No changes to medicines.  Continue lipitor 40 mg before bed.  Continue to work on eating a healthy diet and exercise.  Labs drawn today.   

## 2022-10-07 NOTE — Assessment & Plan Note (Signed)
The current medical regimen is effective;  continue present plan and medications. Breztri 2 puffs twice daily,  albuterol inhaler one puff daily.

## 2022-10-07 NOTE — Assessment & Plan Note (Signed)
Continue vitamin D 50K daily.

## 2022-10-07 NOTE — Assessment & Plan Note (Signed)
Desires no further treatment with chemo or radiation.  I discussed with Dr. Angelene Giovanni and he is happy to see her to help with other issues such as anemia.

## 2022-10-07 NOTE — Assessment & Plan Note (Signed)
Defer management to nephrology. Per their notes, patient is not interested in dialysis.

## 2022-10-07 NOTE — Assessment & Plan Note (Signed)
The current medical regimen is effective;  continue present plan and medications. Continue inhalers.

## 2022-10-07 NOTE — Assessment & Plan Note (Signed)
Continue amlodipine 10 mg daily.  Continue torsemide 20 mg daily as needed.

## 2022-10-07 NOTE — Assessment & Plan Note (Signed)
Continue oxygen 2 L daily. 

## 2022-10-07 NOTE — Progress Notes (Signed)
Potassium very high. Patient called Saturday 3 am by Gerre Pebbles [facility] and instructed to go to ED. Patient went to [facility]. Liver function normal.  Kidney function abnormal.  stable Anemia improved  Cholesterol: LDL increased from 50 to 119. Is she taking lipitor?

## 2022-10-07 NOTE — Assessment & Plan Note (Signed)
On 2 L oxygen. ?

## 2022-10-07 NOTE — Assessment & Plan Note (Addendum)
Continue oxygen 2 L daily.  The patient has a medical condition which requires positioning of trhe body in ways not feasible in an ordinary bed. Patient requires the head of the bed to be elevated more than 30 degrees most of the time due to copd and respiratory failure. Patient also requires frequent changes in body position. Please provide patient with semi electronic  hospital bed.

## 2022-10-07 NOTE — Assessment & Plan Note (Signed)
Management per nephrology. 

## 2022-10-07 NOTE — Assessment & Plan Note (Signed)
>>  ASSESSMENT AND PLAN FOR GERD (GASTROESOPHAGEAL REFLUX DISEASE) WRITTEN ON 10/07/2022  8:49 AM BY COX, KIRSTEN, MD  The current medical regimen is effective;  continue present plan and medications. Continue pantoprazole 40 mg daily.

## 2022-10-07 NOTE — Assessment & Plan Note (Signed)
Secondary to renal cancer.

## 2022-10-07 NOTE — Assessment & Plan Note (Signed)
Continue vitamin D 50K daily. 

## 2022-10-08 ENCOUNTER — Other Ambulatory Visit: Payer: Self-pay

## 2022-10-08 ENCOUNTER — Telehealth: Payer: Self-pay

## 2022-10-08 DIAGNOSIS — I110 Hypertensive heart disease with heart failure: Secondary | ICD-10-CM

## 2022-10-08 MED ORDER — AMLODIPINE BESYLATE 10 MG PO TABS
10.0000 mg | ORAL_TABLET | Freq: Every day | ORAL | 1 refills | Status: DC
Start: 2022-10-08 — End: 2023-01-11

## 2022-10-08 NOTE — Progress Notes (Signed)
Care Management & Coordination Services Pharmacy Team  Reason for Encounter: COPD  Contacted patient to discuss COPD disease state. {US HC Outreach:28874}  Current COPD regimen:  Breztri 160-9-4.72mcg Levalbuterol 1.25mg  nebulizer Ventolin inhaler    09/19/2021   11:01 AM  CAT Score  Total CAT Score 18   Any recent hospitalizations or ED visits since last visit with CPP? Yes Reports***denies COPD symptoms, including {COPD Symptoms:24140} What recent interventions/DTPs have been made by any provider to improve breathing since last visit: Have you had exacerbation/flare-up since last visit? {yes/no:20286} What do you do when you are short of breath?  {SOBresposne:24139}***  Respiratory Devices/Equipment Do you have a nebulizer? {yes/no:20286} Do you use a Peak Flow Meter? {yes/no:20286} Do you use a maintenance inhaler? {yes/no:20286} How often do you forget to use your daily inhaler? *** Do you use a rescue inhaler? {yes/no:20286} How often do you use your rescue inhaler?  {CHL [employer] Inhaler NWGN:[id number]} Do you use a spacer with your inhaler? {yes/no:20286}  Adherence Review: Does the patient have >5 day gap between last estimated fill date for maintenance inhaler medications? {yes/no:20286}   Chart Review:  Recent office visits:  10/05/22 Blane Ohara MD. Seen for routine visit. Started on Ondansetron HCI 8mg  TID. D/C Metoprolol Succinate 25mg . Start pantoprazole 40mg  daily.  09/26/22 Leal-Borjas Marla CMA. Orders Only, Started on Synthroid and Vitamin B12 .   09/17/22 Blane Ohara MD. Seen for follow up. D/C Budesonide 0.5 nebulizer and Ipratropium 0.5mg .  Recent consult visits:  09/26/22 (Care Coordination) Sarajane Jews LCSW. No med changes.   09/17/22 (Care Coordination) Sarajane Jews LCSW. No med changes.   09/04/22 (Care Coordination) Sarajane Jews LCSW. No med changes.   08/29/22 (Infectious Disease) Danelle Earthly MD. Seen  for Bacteremia. No med changes.   Hospital visits:  None  Medications: Outpatient Encounter Medications as of 10/08/2022  Medication Sig Note   amLODipine (NORVASC) 10 MG tablet Take by mouth.    atorvastatin (LIPITOR) 40 MG tablet Take 1 tablet (40 mg total) by mouth daily. Per ccm    Budeson-Glycopyrrol-Formoterol (BREZTRI AEROSPHERE) 160-9-4.8 MCG/ACT AERO Inhale 2 puffs into the lungs in the morning and at bedtime. 08/14/2022: 08/14/22- reports not taking due to expense of medication- CCM Pharmacist notified   Cholecalciferol (VITAMIN D) 125 MCG (5000 UT) CAPS Take 1 capsule by mouth daily.    cyanocobalamin (VITAMIN B12) 1000 MCG/ML injection Inject 1 mL (1,000 mcg total) into the muscle once a week.    ferrous sulfate 325 (65 FE) MG tablet Take 1 tablet (325 mg total) by mouth daily. 08/14/2022: 08/14/22: Patient reports, "I am taking the vitamin pill;" she is unable to definitively state whether she has this medication or not   levalbuterol (XOPENEX) 1.25 MG/3ML nebulizer solution Take 1.25 mg by nebulization 2 (two) times daily. 08/14/2022: 08/14/22: Patient reports not taking due to cost   nitroGLYCERIN (NITROSTAT) 0.4 MG SL tablet Place 1 tablet (0.4 mg total) under the tongue every 5 (five) minutes as needed for chest pain.    ondansetron (ZOFRAN) 8 MG tablet Take 1 tablet (8 mg total) by mouth every 8 (eight) hours as needed for nausea or vomiting.    ondansetron (ZOFRAN-ODT) 4 MG disintegrating tablet Take 1 tablet (4 mg total) by mouth every 8 (eight) hours as needed for nausea or vomiting.    OXYGEN Inhale 2 L into the lungs continuous.    pantoprazole (PROTONIX) 40 MG tablet TAKE 1 TABLET(40 MG) BY MOUTH DAILY  rOPINIRole (REQUIP) 2 MG tablet TAKE 1 TABLET(2 MG) BY MOUTH IN THE MORNING AND AT BEDTIME (Patient taking differently: Take 2 mg by mouth 2 (two) times daily.)    sodium polystyrene (KAYEXALATE) powder 15 gm dose four times a day x 3 days.    SYNTHROID 50 MCG tablet Take 1 tablet  (50 mcg total) by mouth daily before breakfast.    torsemide (DEMADEX) 20 MG tablet Take 1 tablet (20 mg total) by mouth daily as needed (edema).    traMADol (ULTRAM) 50 MG tablet Take by mouth.    VENTOLIN HFA 108 (90 Base) MCG/ACT inhaler Inhale 1-2 puffs into the lungs every 6 (six) hours as needed for wheezing or shortness of breath. 08/14/2022: 08/14/22: reports was given this medication at the hospital; reports is taking she "thinks"   No facility-administered encounter medications on file as of 10/08/2022.     Roxana Hires, CMA Clinical Pharmacist Assistant  [phone]

## 2022-10-11 ENCOUNTER — Telehealth: Payer: Self-pay

## 2022-10-11 NOTE — Progress Notes (Cosign Needed)
Breztri PAP has been updated and will be mailed back to pt to complete. She wants to try and get assistance this year.   Roxana Hires, CMA Clinical Pharmacist Assistant  [phone]

## 2022-10-12 ENCOUNTER — Ambulatory Visit (INDEPENDENT_AMBULATORY_CARE_PROVIDER_SITE_OTHER): Payer: Medicare Other

## 2022-10-12 ENCOUNTER — Telehealth: Payer: Self-pay

## 2022-10-12 ENCOUNTER — Other Ambulatory Visit: Payer: Self-pay

## 2022-10-12 DIAGNOSIS — E538 Deficiency of other specified B group vitamins: Secondary | ICD-10-CM | POA: Diagnosis not present

## 2022-10-12 DIAGNOSIS — E875 Hyperkalemia: Secondary | ICD-10-CM

## 2022-10-12 MED ORDER — CYANOCOBALAMIN 1000 MCG/ML IJ SOLN
1000.0000 ug | Freq: Once | INTRAMUSCULAR | Status: AC
Start: 2022-10-12 — End: 2022-10-12
  Administered 2022-10-12: 1000 ug via INTRAMUSCULAR

## 2022-10-12 NOTE — Telephone Encounter (Signed)
Patient called and recommended to come in on Monday for cmp. Dr. Sedalia Muta

## 2022-10-12 NOTE — Telephone Encounter (Signed)
Elaina from [facility] called and stated that the Patient no showed her appointment with Dr. Signe Colt yesterday, and stated she missed the appointment last week too. 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 Please advise.

## 2022-10-12 NOTE — Progress Notes (Signed)
Patient tolerate injection well.

## 2022-10-15 ENCOUNTER — Ambulatory Visit: Payer: Medicare Other

## 2022-10-15 DIAGNOSIS — E875 Hyperkalemia: Secondary | ICD-10-CM

## 2022-10-15 DIAGNOSIS — N184 Chronic kidney disease, stage 4 (severe): Secondary | ICD-10-CM | POA: Insufficient documentation

## 2022-10-16 ENCOUNTER — Telehealth: Payer: Self-pay

## 2022-10-16 ENCOUNTER — Ambulatory Visit: Payer: Self-pay | Admitting: Licensed Clinical Social Worker

## 2022-10-16 LAB — COMPREHENSIVE METABOLIC PANEL
ALT: 12 IU/L (ref 0–32)
AST: 16 IU/L (ref 0–40)
Albumin/Globulin Ratio: 1.5 (ref 1.2–2.2)
Albumin: 3.7 g/dL — ABNORMAL LOW (ref 3.8–4.8)
Alkaline Phosphatase: 89 IU/L (ref 44–121)
BUN/Creatinine Ratio: 13 (ref 12–28)
BUN: 50 mg/dL — ABNORMAL HIGH (ref 8–27)
Bilirubin Total: 0.2 mg/dL (ref 0.0–1.2)
CO2: 18 mmol/L — ABNORMAL LOW (ref 20–29)
Calcium: 9.3 mg/dL (ref 8.7–10.3)
Chloride: 107 mmol/L — ABNORMAL HIGH (ref 96–106)
Creatinine, Ser: 3.74 mg/dL — ABNORMAL HIGH (ref 0.57–1.00)
Globulin, Total: 2.5 g/dL (ref 1.5–4.5)
Glucose: 90 mg/dL (ref 70–99)
Potassium: 5.2 mmol/L (ref 3.5–5.2)
Sodium: 140 mmol/L (ref 134–144)
Total Protein: 6.2 g/dL (ref 6.0–8.5)
eGFR: 12 mL/min/{1.73_m2} — ABNORMAL LOW (ref >59)

## 2022-10-16 NOTE — Patient Outreach (Signed)
  Care Coordination   Follow Up Visit Note   10/16/2022 Name: Alexandra [name] MRN: [id number] DOB: [birth year]  Alexandra [name] is a 82 y.o. year old female who sees Cox, Fritzi Mandes, MD for primary care. I spoke with  Alexandra [name] by phone today.  What matters to the patients health and wellness today? Patient is trying to manage her medical needs  and the needs of her spouse    Goals Addressed             This Visit's Progress    Patient Stated she is trying to manage her medical needs and manage needs of her spouse       Interventions:  LCSW spoke today via phone with  Alexandra [name] about needs of client Discussed ambulation. She said she uses rollator walker to help her walk. She is at risk for falls. She gets fatigued when standing for periods of time Discussed medication procurement.  Reviewed program support for client with RN, LCSW and Pharmacist Discussed vision of client. She has new glasses. She said new glasses help her with her vision Discussed support with PCP, Dr. Blane Ohara Provided counseling support Encouraged Alexandra [name] to call LCSW for SW support as needed at [phone].          SDOH assessments and interventions completed:  Yes  SDOH Interventions Today    Flowsheet Row Most Recent Value  SDOH Interventions   Physical Activity Interventions Other (Comments)  [uses rollator to help her walk]  Stress Interventions Provide Counseling  [client has stress in managing care needs of her spouse]        Care Coordination Interventions:  Yes, provided   Interventions Today    Flowsheet Row Most Recent Value  Chronic Disease   Chronic disease during today's visit Other  [spoke with client about client needs]  General Interventions   General Interventions Discussed/Reviewed General Interventions Discussed, [employer] transport needs,  discussed client support from her son and daughter]  Exercise Interventions   Exercise  Discussed/Reviewed Physical Activity  [client uses rollator to help her walk]  Education Interventions   Education Provided Provided Education  Provided Verbal Education On [employer]  [discussed program support]  Mental Health Interventions   Mental Health Discussed/Reviewed Anxiety, Coping Strategies  [discussed coping strategies:  watching TV, talking on phone with family members]  Nutrition Interventions   Nutrition Discussed/Reviewed Nutrition Discussed  Pharmacy Interventions   Pharmacy Dicussed/Reviewed Pharmacy Topics Discussed  Safety Interventions   Safety Discussed/Reviewed Fall Risk        Follow up plan: Follow up call scheduled for 12/04/22 at 1:30 PM     Encounter Outcome:  Pt. Visit Completed   Kelton Pillar.Jasia Hiltunen MSW, LCSW Licensed Visual merchandiser [facility] Care Management [phone]

## 2022-10-16 NOTE — Telephone Encounter (Signed)
Transition Care Management Follow-up Telephone Call Date of discharge and from where: [facility] 4/28 How have you been since you were released from the hospital? Doing good Any questions or concerns? No  Items Reviewed: Did the pt receive and understand the discharge instructions provided? Yes  Medications obtained and verified? Yes  Other? No  Any new allergies since your discharge? No  Dietary orders reviewed? No Do you have support at home? Yes    Follow up appointments reviewed:  PCP Hospital f/u appt confirmed? Yes  Scheduled to see PCP  on 5/6 @ . Specialist Hospital f/u appt confirmed? No  Scheduled to see  on  @ . Are transportation arrangements needed? No  If their condition worsens, is the pt aware to call PCP or go to the Emergency Dept.? Yes Was the patient provided with contact information for the PCP's office or ED? Yes Was to pt encouraged to call back with questions or concerns? Yes

## 2022-10-16 NOTE — Patient Instructions (Signed)
Visit Information  Thank you for taking time to visit with me today. Please don't hesitate to contact me if I can be of assistance to you.   Following are the goals we discussed today:   Goals Addressed             This Visit's Progress    Patient Stated she is trying to manage her medical needs and manage needs of her spouse       Interventions:  LCSW spoke today via phone with  [name] about needs of client Discussed ambulation. She said she uses rollator walker to help her walk. She is at risk for falls. She gets fatigued when standing for periods of time Discussed medication procurement.  Reviewed program support for client with RN, LCSW and Pharmacist Discussed vision of client. She has new glasses. She said new glasses help her with her vision Discussed support with PCP, Dr. Blane Ohara Provided counseling support Encouraged [place] to call LCSW for SW support as needed at [phone].          Our next appointment is by telephone on 12/04/22 at 1:30 PM   Please call the care guide team at [phone] if you need to cancel or reschedule your appointment.   If you are experiencing a Mental Health or Behavioral Health Crisis or need someone to talk to, please go to [facility] [address], [place] [phone])   The patient verbalized understanding of instructions, educational materials, and care plan provided today and DECLINED offer to receive copy of patient instructions, educational materials, and care plan.   The patient has been provided with contact information for the care management team and has been advised to call with any health related questions or concerns.   Kelton Pillar.Willadean Guyton MSW, LCSW Licensed Visual merchandiser [facility] Care Management [phone]

## 2022-10-19 ENCOUNTER — Ambulatory Visit (INDEPENDENT_AMBULATORY_CARE_PROVIDER_SITE_OTHER): Payer: Medicare Other

## 2022-10-19 DIAGNOSIS — E538 Deficiency of other specified B group vitamins: Secondary | ICD-10-CM

## 2022-10-19 MED ORDER — CYANOCOBALAMIN 1000 MCG/ML IJ SOLN
1000.0000 ug | Freq: Once | INTRAMUSCULAR | Status: AC
Start: 2022-10-19 — End: 2022-10-19
  Administered 2022-10-19: 1000 ug via INTRAMUSCULAR

## 2022-10-23 ENCOUNTER — Telehealth: Payer: Self-pay

## 2022-10-23 NOTE — Telephone Encounter (Signed)
Patient called stating that the people that delivered her hospital bed that she is getting it from is going out of business and stated that they were going to have to come pick it up. She is needing to know where else she can get another hospital bed through. She states she believed she had gotten the current Hospital bed through Home health.   Name of business: [facility] [phone]  Please advise.

## 2022-10-24 NOTE — Telephone Encounter (Signed)
Order written.  Send to [place] home patient unless patient or family has a preference. Dr. Sedalia Muta

## 2022-10-25 NOTE — Telephone Encounter (Signed)
Patient informed, Sending Order for hospital bed.

## 2022-10-26 ENCOUNTER — Ambulatory Visit: Payer: Medicare Other

## 2022-10-27 NOTE — Progress Notes (Unsigned)
Cardiology Office Note:    Date:  10/29/2022   ID:  Alexandra [name], DOB [birth year], MRN [id number]  PCP:  Blane Ohara, MD  Cardiologist:  Norman Herrlich, MD    Referring MD: Blane Ohara, MD    ASSESSMENT:    1. Hypertensive heart disease with heart failure (HCC)   2. CKD (chronic kidney disease) stage 5, GFR less than 15 ml/min (HCC)   3. 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 Pericardial effusion   5. Malignant neoplasm of urinary bladder, unspecified site [facility])    PLAN:    In order of problems listed above:  She is doing remarkably well I think her blood pressure is reasonably controlled and I do not see an alternative to her current antihypertensive with amlodipine and loop diuretic with her severe stage V CKD. Labs for hyperkalemia and anemia follow-up with her local physician Dr. Sedalia Muta Resolution of her clinical pericardial and pleural effusions Follow-up with urology regarding her obstructive uropathy I gave her an educational sheet on foods to avoid with hyper kalemia   Next appointment: I will plan to see her in 6 months   Medication Adjustments/Labs and Tests Ordered: Current medicines are reviewed at length with the patient today.  Concerns regarding medicines are outlined above.  No orders of the defined types were placed in this encounter.  No orders of the defined types were placed in this encounter.    History of Present Illness:    Alexandra [name] is a 82 y.o. female with a hx of hypertensive disease with stage V CKD complicated by heart failure and bladder cancer treated with chemo and radiation and renal cell cancer with left nephrectomy and anemia requiring transfusion.  During recent admission to the hospital with decompensated heart failure she had bilateral pleural has small pericardial effusion and had thoracentesis performed 325 cc in the right 500 cc on the left.  Most recently admitted to [facility] [phone] with renal failure hypokalemia obstructive uropathy with stent exchange pyelonephritis and Enterococcus faecalis bacteremia without evidence of Endo carditis and while hospitalized removal of right chest wall Port-A-Cath.  She was last seen 07/20/2022.  Chart review shows that she is no longer receiving chemotherapy.  She has had problems with recurrent hyper kalemia her last creatinine 3.74 GFR 12 cc/min.  Renal stone CT 08/04/2019 showed minimal bilateral pleural effusion  She had an echocardiogram 08/20/2022 there was mild left ventricular dysfunction EF 40 to 45% with mild concentric LVH grade 1 diastolic filling pattern GLS reduced -14.5.  Right ventricle normal size and function left atrium is mildly enlarged there is mild to moderate mitral regurgitation.  She had a TEE performed 08/10/2022 indication bacteremia no findings of endocarditis interestingly her ejection fraction was described as normal 60 to 65%.  Compliance with diet, lifestyle and medications: Yes  She is doing remarkably well no longer requires oxygen feels much better and is not having shortness of breath chest pain palpitation or syncope She has chronic edema she takes high-dose calcium channel blocker with her CKD I just do not see another reasonable alternative for antihypertensive therapy and she will continue her current loop diuretic She follows with local urology for her obstructive uropathy No recurrent fever or chills Past Medical History:  Diagnosis Date   CKD (chronic kidney disease), stage IV (HCC)    COPD (chronic obstructive pulmonary disease) (HCC)    GERD (gastroesophageal reflux disease)  Hyperlipidemia    Hyperparathyroidism, 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 82 yo   BACK SURGERY  2016   lower L 4 to L 5   bladder tack and uterus lift  10/10/2015   CHOLECYSTECTOMY     summer of 2022   CYSTOSCOPY WITH RETROGRADE PYELOGRAM, URETEROSCOPY AND STENT PLACEMENT Right 08/04/2022   Procedure: CYSTOSCOPY WITH RIGHT URETERAL STENT EXCHANGE;  Surgeon: Despina Arias, MD;  Location[facility];  Service: Urology;  Laterality: Right;   IR RADIOLOGIST EVAL & MGMT  12/03/2018   IR RADIOLOGIST EVAL & MGMT  12/31/2018   IR REMOVAL TUN ACCESS W/ PORT W/O FL MOD SED  08/08/2022   PARATHYROIDECTOMY Left 07/02/2016   Procedure: LEFT INFERIOR PARATHYROIDECTOMY;  Surgeon: Darnell Level, MD;  Location: [facility] ORS;  Service: General;  Laterality: Left;   SHOULDER OPEN ROTATOR CUFF REPAIR  12/2019   TEE WITHOUT CARDIOVERSION N/A 08/10/2022   Procedure: TRANSESOPHAGEAL ECHOCARDIOGRAM (TEE);  Surgeon: Chrystie Nose, MD;  [facility] ENDOSCOPY;  Service: Cardiovascular;  Laterality: N/A;   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    Current Medications: Current Meds  Medication Sig   amLODipine (NORVASC) 10 MG tablet Take 1 tablet (10 mg total) by mouth daily.   atorvastatin (LIPITOR) 40 MG tablet Take 1 tablet (40 mg total) by mouth daily. Per ccm   Budeson-Glycopyrrol-Formoterol (BREZTRI AEROSPHERE) 160-9-4.8 MCG/ACT AERO Inhale 2 puffs into the lungs in the morning and at bedtime.   Cholecalciferol (VITAMIN D) 125 MCG (5000 UT) CAPS Take 1 capsule by mouth daily.   cyanocobalamin (VITAMIN B12) 1000 MCG/ML injection Inject 1 mL (1,000 mcg total) into the muscle once a week.   ferrous sulfate 325 (65 FE) MG tablet Take 1 tablet (325 mg total) by mouth daily.   levalbuterol (XOPENEX) 1.25 MG/3ML nebulizer solution Take 1.25 mg by nebulization 2 (two) times daily.   nitroGLYCERIN (NITROSTAT) 0.4 MG SL tablet Place 1 tablet (0.4 mg total) under the tongue every 5 (five) minutes as needed for chest pain.   ondansetron (ZOFRAN) 8 MG tablet Take 1 tablet (8  mg total) by mouth every 8 (eight) hours as needed for nausea or vomiting.   pantoprazole (PROTONIX) 40 MG tablet TAKE 1 TABLET(40 MG) BY MOUTH DAILY   rOPINIRole (REQUIP) 2 MG tablet TAKE 1 TABLET(2 MG) BY MOUTH IN THE MORNING AND AT BEDTIME (Patient taking differently: Take 2 mg by mouth 2 (two) times daily.)   sodium polystyrene (KAYEXALATE) powder 15 gm dose four times a day x 3 days.   SYNTHROID 50 MCG tablet Take 1 tablet (50 mcg total) by mouth daily before breakfast.   torsemide (DEMADEX) 20 MG tablet Take 1 tablet (20 mg total) by mouth daily as needed (edema).   traMADol (ULTRAM) 50 MG tablet Take by mouth.   VENTOLIN HFA 108 (90 Base) MCG/ACT inhaler Inhale 1-2 puffs into the lungs every 6 (six) hours as needed for wheezing or shortness of breath.     Allergies:   Contrast media [iodinated contrast media], Omeprazole, Shellfish allergy, and Shellfish-derived products   Social History   Socioeconomic History   Marital status: Married    Spouse name: Molly Maduro   Number of children:  2   Years of education: 24   Highest education level: 11th grade  Occupational History   Occupation: Retired  Tobacco Use   Smoking status: Former    Packs/day: 1.00    Years: 50.00    Additional pack years: 0.00    Total pack years: 50.00    Types: Cigarettes    Quit date: 06/11/2005    Years since quitting: 17.3   Smokeless tobacco: Never  Vaping Use   Vaping Use: Never used  Substance and Sexual Activity   Alcohol use: No   Drug use: No   Sexual activity: Yes    Partners: Male  Other Topics Concern   Not on file  Social History Narrative   Not on file   Social Determinants of Health   Financial Resource Strain: Medium Risk (09/26/2022)   Overall Financial Resource Strain (CARDIA)    Difficulty of Paying Living Expenses: Somewhat hard  Food Insecurity: No Food Insecurity (08/14/2022)   Hunger Vital Sign    Worried About Running Out of Food in the Last Year: Never true    Ran Out of  Food in the Last Year: Never true  Transportation Needs: No Transportation Needs (08/14/2022)   PRAPARE - Administrator, Civil Service (Medical): No    Lack of Transportation (Non-Medical): No  Physical Activity: Inactive (10/16/2022)   Exercise Vital Sign    Days of Exercise per Week: 0 days    Minutes of Exercise per Session: 0 min  Stress: Stress Concern Present (10/16/2022)   [employer] of Occupational Health - Occupational Stress Questionnaire    Feeling of Stress : To some extent  Social Connections: Moderately Integrated (02/15/2022)   Social Connection and Isolation Panel [NHANES]    Frequency of Communication with Friends and Family: More than three times a week    Frequency of Social Gatherings with Friends and Family: More than three times a week    Attends Religious Services: More than 4 times per year    Active Member of [employer] or Organizations: No    Attends Engineer, structural: Never    Marital Status: Married     Family History: The patient's  family history includes COPD in her sister; Emphysema in her mother; Hyperlipidemia in her brother, sister, and sister; Hypertension in her brother, sister, and sister; Lymphoma in her sister; Obesity in her brother. There is no history of Breast cancer. ROS:   Please see the history of present illness.    All other systems reviewed and are negative.  EKGs/Labs/Other Studies Reviewed:    The following studies were reviewed today:  Cardiac Studies & Procedures     STRESS TESTS  MYOCARDIAL PERFUSION IMAGING 01/17/2018   ECHOCARDIOGRAM  ECHOCARDIOGRAM COMPLETE BUBBLE STUDY 08/08/2022  Narrative ECHOCARDIOGRAM REPORT    Patient Name:   ONDRA KALE Date of Exam: 08/08/2022 Medical Rec #:  [id number]       Height:       62.0 in Accession #:    [id number]      Weight:       162.0 lb Date of Birth:  August 29, 1940       BSA:          1.748 m Patient Age:    81 years        BP:           115/55  mmHg Patient Gender: F  HR:           89 bpm. Exam Location:  Inpatient  Procedure: 2D Echo, Cardiac Doppler and Color Doppler  Indications:     Endocarditis/pericardial effusion  History:         Patient has prior history of Echocardiogram examinations, most recent 06/20/2022. COPD; Risk Factors:Hypertension and Dyslipidemia. CKD, hx of RCC s/p total nephrectomy.  Sonographer:     Milda Smart Referring Phys:  [id number] [facility] Diagnosing Phys: Arvilla Meres MD  IMPRESSIONS   1. Left ventricular ejection fraction, by estimation, is 60 to 65%. The left ventricle has normal function. The left ventricle has no regional wall motion abnormalities. Left ventricular diastolic parameters were normal. 2. Right ventricular systolic function is normal. The right ventricular size is normal. 3. The mitral valve is normal in structure. Trivial mitral valve regurgitation. No evidence of mitral stenosis. 4. The aortic valve is tricuspid. There is mild calcification of the aortic valve. Aortic valve regurgitation is not visualized. Aortic valve sclerosis/calcification is present, without any evidence of aortic stenosis. 5. The inferior vena cava is normal in size with greater than 50% respiratory variability, suggesting right atrial pressure of 3 mmHg. 6. I was asked to re-review the echo regarding the aortic valve. There is a small focal calcified nodule on the right coronary cusp of the aortic valve. I cannot exclude endocarditis. If clinic picture suspicious for endocarditis would strongly recommend TEE,  FINDINGS Left Ventricle: Left ventricular ejection fraction, by estimation, is 60 to 65%. The left ventricle has normal function. The left ventricle has no regional wall motion abnormalities. The left ventricular internal cavity size was normal in size. There is no left ventricular hypertrophy. Left ventricular diastolic parameters were normal.  Right Ventricle: The right  ventricular size is normal. No increase in right ventricular wall thickness. Right ventricular systolic function is normal.  Left Atrium: Left atrial size was normal in size.  Right Atrium: Right atrial size was normal in size.  Pericardium: There is no evidence of pericardial effusion.  Mitral Valve: The mitral valve is normal in structure. Trivial mitral valve regurgitation. No evidence of mitral valve stenosis. MV peak gradient, 8.1 mmHg. The mean mitral valve gradient is 4.0 mmHg.  Tricuspid Valve: The tricuspid valve is normal in structure. Tricuspid valve regurgitation is trivial. No evidence of tricuspid stenosis.  Aortic Valve: The aortic valve is tricuspid. There is mild calcification of the aortic valve. Aortic valve regurgitation is not visualized. Aortic valve sclerosis/calcification is present, without any evidence of aortic stenosis.  Pulmonic Valve: The pulmonic valve was normal in structure. Pulmonic valve regurgitation is trivial. No evidence of pulmonic stenosis.  Aorta: The aortic root is normal in size and structure.  Venous: The inferior vena cava is normal in size with greater than 50% respiratory variability, suggesting right atrial pressure of 3 mmHg.  IAS/Shunts: No atrial level shunt detected by color flow Doppler.   LEFT VENTRICLE PLAX 2D LVIDd:         3.70 cm     Diastology LVIDs:         2.70 cm     LV e' medial:    7.94 cm/s LV PW:         0.90 cm     LV E/e' medial:  14.0 LV IVS:        0.80 cm     LV e' lateral:   8.49 cm/s LVOT diam:     1.90 cm  LV E/e' lateral: 13.1 LV SV:         69 LV SV Index:   39 LVOT Area:     2.84 cm  LV Volumes (MOD) LV vol d, MOD A2C: 78.9 ml LV vol d, MOD A4C: 90.4 ml LV vol s, MOD A2C: 34.8 ml LV vol s, MOD A4C: 35.0 ml LV SV MOD A2C:     44.1 ml LV SV MOD A4C:     90.4 ml LV SV MOD BP:      48.5 ml  RIGHT VENTRICLE            IVC RV S prime:     7.51 cm/s  IVC diam: 1.90 cm TAPSE (M-mode): 1.4 cm  LEFT  ATRIUM             Index        RIGHT ATRIUM           Index LA diam:        2.80 cm 1.60 cm/m   RA Area:     12.60 cm LA Vol (A2C):   56.6 ml 32.38 ml/m  RA Volume:   26.90 ml  15.39 ml/m LA Vol (A4C):   44.7 ml 25.57 ml/m LA Biplane Vol: 50.7 ml 29.00 ml/m AORTIC VALVE LVOT Vmax:   117.00 cm/s LVOT Vmean:  76.000 cm/s LVOT VTI:    0.243 m  AORTA Ao Root diam: 2.80 cm Ao Asc diam:  3.10 cm  MITRAL VALVE                TRICUSPID VALVE MV Area (PHT): 3.27 cm     TR Peak grad:   19.5 mmHg MV Area VTI:   1.88 cm     TR Vmax:        221.00 cm/s MV Peak grad:  8.1 mmHg MV Mean grad:  4.0 mmHg     SHUNTS MV Vmax:       1.42 m/s     Systemic VTI:  0.24 m MV Vmean:      99.3 cm/s    Systemic Diam: 1.90 cm MV Decel Time: 232 msec MR Peak grad: 16.0 mmHg MR Vmax:      200.00 cm/s MV E velocity: 111.50 cm/s MV A velocity: 126.00 cm/s MV E/A ratio:  0.88  Arvilla Meres MD Electronically signed by Arvilla Meres MD Signature Date/Time: 08/08/2022/10:31:18 AM    Final (Updated)   TEE  ECHO TEE 08/10/2022  Narrative TRANSESOPHOGEAL ECHO REPORT    Patient Name:   AVAMARIA FAUBEL Date of Exam: 08/10/2022 Medical Rec #:  [id number]       Height:       62.0 in Accession #:    [id number]      Weight:       161.6 lb Date of Birth:  01-26-1941       BSA:          1.746 m Patient Age:    81 years        BP:           144/69 mmHg Patient Gender: F               HR:           90 bpm. Exam Location:  Inpatient  Procedure: Transesophageal Echo, 3D Echo, Color Doppler and Cardiac Doppler  Indications:     Bacteremia  History:         Patient has prior history of Echocardiogram  examinations, most recent 08/08/2022. COPD; Risk Factors:Hypertension and Dyslipidemia.  Sonographer:     Irving Burton Senior RDCS Referring Phys:  Robet Leu, R Diagnosing Phys: Zoila Shutter MD  PROCEDURE: After discussion of the risks and benefits of a TEE, an informed consent was obtained from the  patient. The transesophogeal probe was passed without difficulty through the esophogus of the patient. Sedation performed by different physician. The patient was monitored while under deep sedation. Anesthestetic sedation was provided intravenously by Anesthesiology: 95mg  of Propofol. The patient developed no complications during the procedure.  IMPRESSIONS   1. Left ventricular ejection fraction, by estimation, is 60 to 65%. The left ventricle has normal function. The left ventricle has no regional wall motion abnormalities. 2. Right ventricular systolic function is normal. The right ventricular size is normal. 3. No left atrial/left atrial appendage thrombus was detected. 4. The mitral valve is grossly normal. Trivial mitral valve regurgitation. 5. The aortic valve is tricuspid. Aortic valve regurgitation is not visualized. Aortic valve sclerosis is present, with no evidence of aortic valve stenosis.  Conclusion(s)/Recommendation(s): No evidence of vegetation/infective endocarditis on this transesophageael echocardiogram.  FINDINGS Left Ventricle: Left ventricular ejection fraction, by estimation, is 60 to 65%. The left ventricle has normal function. The left ventricle has no regional wall motion abnormalities. The left ventricular internal cavity size was normal in size. There is no left ventricular hypertrophy.  Right Ventricle: The right ventricular size is normal. No increase in right ventricular wall thickness. Right ventricular systolic function is normal.  Left Atrium: Left atrial size was normal in size. No left atrial/left atrial appendage thrombus was detected.  Right Atrium: Right atrial size was normal in size.  Pericardium: There is no evidence of pericardial effusion.  Mitral Valve: The mitral valve is grossly normal. Trivial mitral valve regurgitation.  Tricuspid Valve: The tricuspid valve is grossly normal. Tricuspid valve regurgitation is trivial.  Aortic Valve: The  aortic valve is tricuspid. Aortic valve regurgitation is not visualized. Aortic valve sclerosis is present, with no evidence of aortic valve stenosis.  Pulmonic Valve: The pulmonic valve was normal in structure. Pulmonic valve regurgitation is not visualized.  Aorta: The aortic root and ascending aorta are structurally normal, with no evidence of dilitation. There is minimal (Grade I) plaque.  Venous: The left upper pulmonary vein is normal.  IAS/Shunts: No atrial level shunt detected by color flow Doppler.  Additional Comments: Spectral Doppler performed.  Zoila Shutter MD Electronically signed by Zoila Shutter MD Signature Date/Time: 08/10/2022/8:34:02 AM    Final             Recent Labs: 03/27/2022: NT-Pro BNP 1,313 08/03/2022: B Natriuretic Peptide 612.5 08/13/2022: Magnesium 1.9 09/17/2022: TSH 15.600 10/05/2022: Hemoglobin 9.2; Platelets 337 10/15/2022: ALT 12; BUN 50; Creatinine, Ser 3.74; Potassium 5.2; Sodium 140  Recent Lipid Panel    Component Value Date/Time   CHOL 205 (H) 10/05/2022 0940   TRIG 100 10/05/2022 0940   HDL 68 10/05/2022 0940   CHOLHDL 3.0 10/05/2022 0940   LDLCALC 119 (H) 10/05/2022 0940    Physical Exam:    VS:  BP (!) 148/78 (BP Location: Right Arm, Patient Position: Sitting)   Pulse 96   Ht 5\' 2"  (1.575 m)   Wt 141 lb 9.6 oz (64.2 kg)   SpO2 99%   BMI 25.90 kg/m     Wt Readings from Last 3 Encounters:  10/29/22 141 lb 9.6 oz (64.2 kg)  10/05/22 137 lb (62.1 kg)  09/17/22 144 lb (65.3 kg)  GEN: She looks markedly improved she has no neck vein distention no findings of decompensated heart failure well nourished, well developed in no acute distress HEENT: Normal NECK: No JVD; No carotid bruits LYMPHATICS: No lymphadenopathy CARDIAC: RRR, no murmurs, rubs, gallops RESPIRATORY:  Clear to auscultation without rales, wheezing or rhonchi  ABDOMEN: Soft, non-tender, non-distended MUSCULOSKELETAL:  No edema; No deformity  SKIN: Warm and  dry NEUROLOGIC:  Alert and oriented x 3 PSYCHIATRIC:  Normal affect    Signed, Norman Herrlich, MD  10/29/2022 8:36 AM    [facility]

## 2022-10-29 ENCOUNTER — Ambulatory Visit (INDEPENDENT_AMBULATORY_CARE_PROVIDER_SITE_OTHER): Payer: Medicare Other

## 2022-10-29 ENCOUNTER — Encounter: Payer: Self-pay | Admitting: Cardiology

## 2022-10-29 ENCOUNTER — Telehealth: Payer: Self-pay

## 2022-10-29 ENCOUNTER — Ambulatory Visit: Payer: Medicare Other | Attending: Cardiology | Admitting: Cardiology

## 2022-10-29 VITALS — BP 148/78 | HR 96 | Ht 62.0 in | Wt 141.6 lb

## 2022-10-29 DIAGNOSIS — D631 Anemia in chronic kidney disease: Secondary | ICD-10-CM

## 2022-10-29 DIAGNOSIS — N185 Chronic kidney disease, stage 5: Secondary | ICD-10-CM | POA: Diagnosis not present

## 2022-10-29 DIAGNOSIS — C679 Malignant neoplasm of bladder, unspecified: Secondary | ICD-10-CM | POA: Diagnosis not present

## 2022-10-29 DIAGNOSIS — E538 Deficiency of other specified B group vitamins: Secondary | ICD-10-CM

## 2022-10-29 DIAGNOSIS — I3139 Other pericardial effusion (noninflammatory): Secondary | ICD-10-CM | POA: Insufficient documentation

## 2022-10-29 DIAGNOSIS — I110 Hypertensive heart disease with heart failure: Secondary | ICD-10-CM | POA: Diagnosis not present

## 2022-10-29 MED ORDER — CYANOCOBALAMIN 1000 MCG/ML IJ SOLN
1000.0000 ug | Freq: Once | INTRAMUSCULAR | Status: AC
Start: 2022-10-29 — End: 2022-10-29
  Administered 2022-10-29: 1000 ug via INTRAMUSCULAR

## 2022-10-29 NOTE — Progress Notes (Signed)
Care Management & Coordination Services Pharmacy Team  Reason for Encounter: Appointment Reminder  Contacted patient to confirm telephone appointment with Artelia Laroche, PharmD on 10/31/22 at 10:00 am. Spoke with patient on 10/29/2022   Do you have any problems getting your medications? No  What is your top health concern you would like to discuss at your upcoming visit? No top concerns   Have you seen any other providers since your last visit with PCP? Yes, pt saw her Cardiologist   Chart review:  Recent office visits:  None  Recent consult visits:  10/29/22 Norman Herrlich MD (Cardiology). Seen for a consult. No med changes.   Hospital visits:  None   Star Rating Drugs:  Medication:  Last Fill: Day Supply Atorvastatin   05/07/22-01/26/22 90ds  Care Gaps: Annual wellness visit in last year? Yes Colonoscopy:None noted  If Diabetic:N/A Last eye exam / retinopathy screening: Last diabetic foot exam:   Roxana Hires, [facility] Clinical Pharmacist Assistant  [phone]

## 2022-10-29 NOTE — Patient Instructions (Signed)
Medication Instructions:  Your physician recommends that you continue on your current medications as directed. Please refer to the Current Medication list given to you today.  *If you need a refill on your cardiac medications before your next appointment, please call your pharmacy*   Lab Work: None If you have labs (blood work) drawn today and your tests are completely normal, you will receive your results only by: MyChart Message (if you have MyChart) OR A paper copy in the mail If you have any lab test that is abnormal or we need to change your treatment, we will call you to review the results.   Testing/Procedures: None   Follow-Up: At [facility], you and your health needs are our priority.  As part of our continuing mission to provide you with exceptional heart care, we have created designated Provider Care Teams.  These Care Teams include your primary Cardiologist (physician) and Advanced Practice Providers (APPs -  Physician Assistants and Nurse Practitioners) who all work together to provide you with the care you need, when you need it.  We recommend signing up for the patient portal called "MyChart".  Sign up information is provided on this After Visit Summary.  MyChart is used to connect with patients for Virtual Visits (Telemedicine).  Patients are able to view lab/test results, encounter notes, upcoming appointments, etc.  Non-urgent messages can be sent to your provider as well.   To learn more about what you can do with MyChart, go to [email or web address].    Your next appointment:   6 month(s)  Provider:   Brian Munley, MD    Other Instructions None  

## 2022-10-29 NOTE — Progress Notes (Signed)
Patient came in today for b12 injection. 1 ml of b12 was given to her in the left arm and patient tolerated injection well.   Patient stated that this is her 4th week and wanted to know if she needs to continue her injections weekly.

## 2022-10-31 ENCOUNTER — Other Ambulatory Visit: Payer: Self-pay

## 2022-10-31 ENCOUNTER — Ambulatory Visit: Payer: Medicare Other

## 2022-10-31 DIAGNOSIS — E782 Mixed hyperlipidemia: Secondary | ICD-10-CM

## 2022-10-31 MED ORDER — ATORVASTATIN CALCIUM 40 MG PO TABS
40.0000 mg | ORAL_TABLET | Freq: Every day | ORAL | 1 refills | Status: DC
Start: 2022-10-31 — End: 2023-10-13

## 2022-10-31 NOTE — Patient Outreach (Signed)
Care Management & Coordination Services Pharmacy Note  10/31/2022 Name:  Alexandra [name] MRN:  [id number] DOB:  09-Jun-1941  Recommendations/Changes made from today's visit: -Patient missed Prolia shot in December 2023. Coordinated with [facility] pool to setup -Patient hasn't taken Atorvastatin in many months. Coordinated with [facility] pool to send in -Patient still hasn't brought back Breztri PAP. Will re-send one more time   Subjective: Alexandra [name] is an 82 y.o. year old female who is a primary patient of Cox, Kirsten, MD.  The care coordination team was consulted for assistance with disease management and care coordination needs.    Engaged with patient by telephone for follow up visit.  Recent office visits:  None   Recent consult visits:  10/29/22 Norman Herrlich MD (Cardiology). Seen for a consult. No med changes.    Hospital visits:  None   Objective:  Lab Results  Component Value Date   CREATININE 3.74 (H) 10/15/2022   BUN 50 (H) 10/15/2022   EGFR 12 (L) 10/15/2022   GFRNONAA 13 (L) 08/13/2022   GFRAA 29 (L) 03/28/2020   NA 140 10/15/2022   K 5.2 10/15/2022   CALCIUM 9.3 10/15/2022   CO2 18 (L) 10/15/2022   GLUCOSE 90 10/15/2022    Lab Results  Component Value Date/Time   HGBA1C 5.6 04/04/2021 08:09 AM    Last diabetic Eye exam: No results found for: "HMDIABEYEEXA"  Last diabetic Foot exam: No results found for: "HMDIABFOOTEX"   Lab Results  Component Value Date   CHOL 205 (H) 10/05/2022   HDL 68 10/05/2022   LDLCALC 119 (H) 10/05/2022   TRIG 100 10/05/2022   CHOLHDL 3.0 10/05/2022       Latest Ref Rng & Units 10/15/2022    8:04 AM 10/05/2022    9:40 AM 09/21/2022    9:08 AM  Hepatic Function  Total Protein 6.0 - 8.5 g/dL 6.2  7.0  5.9   Albumin 3.8 - 4.8 g/dL 3.7  4.3  3.8   AST 0 - 40 IU/L 16  22  14    ALT 0 - 32 IU/L 12  18  9    Alk Phosphatase 44 - 121 IU/L 89  93  84   Total Bilirubin 0.0 - 1.2 mg/dL 0.2  <2.1  0.2     Lab Results  Component  Value Date/Time   TSH 15.600 (H) 09/17/2022 09:56 AM       Latest Ref Rng & Units 10/05/2022    9:40 AM 09/17/2022    9:56 AM 08/13/2022   12:19 AM  CBC  WBC 3.4 - 10.8 x10E3/uL 8.3  9.9  10.3   Hemoglobin 11.1 - 15.9 g/dL 9.2  8.7  7.4   Hematocrit 34.0 - 46.6 % 28.3  27.3  23.0   Platelets 150 - 450 x10E3/uL 337  298  222     Lab Results  Component Value Date/Time   VITAMINB12 273 09/17/2022 09:56 AM    Clinical ASCVD: Yes  The 10-year ASCVD risk score (Arnett DK, et al., 2019) is: 50.3%   Values used to calculate the score:     Age: 4 years     Sex: Female     Is Non-Hispanic African American: No     Diabetic: No     Tobacco smoker: Yes     Systolic Blood Pressure: 148 mmHg     Is BP treated: Yes     HDL Cholesterol: 68 mg/dL     Total Cholesterol: 205 mg/dL  Other: (CHADS2VASc if Afib, MMRC or CAT for COPD, ACT, DEXA)     09/17/2022    8:59 AM 08/29/2022    2:30 PM 05/30/2022   10:19 AM  Depression screen PHQ 2/9  Decreased Interest 0 0 0  Down, Depressed, Hopeless 0 0 0  PHQ - 2 Score 0 0 0  Altered sleeping   0  Tired, decreased energy   0  Change in appetite   0  Feeling bad or failure about yourself    0  Trouble concentrating   0  Moving slowly or fidgety/restless   0  Suicidal thoughts   0  PHQ-9 Score   0  Difficult doing work/chores   Not difficult at all     Social History   Tobacco Use  Smoking Status Former   Packs/day: 1.00   Years: 50.00   Additional pack years: 0.00   Total pack years: 50.00   Types: Cigarettes   Quit date: 06/11/2005   Years since quitting: 17.4  Smokeless Tobacco Never   BP Readings from Last 3 Encounters:  10/29/22 (!) 148/78  10/05/22 130/68  09/17/22 130/64   Pulse Readings from Last 3 Encounters:  10/29/22 96  10/05/22 84  09/17/22 (!) 56   Wt Readings from Last 3 Encounters:  10/29/22 141 lb 9.6 oz (64.2 kg)  10/05/22 137 lb (62.1 kg)  09/17/22 144 lb (65.3 kg)   BMI Readings from Last 3 Encounters:   10/29/22 25.90 kg/m  10/05/22 25.06 kg/m  09/17/22 25.51 kg/m    Allergies  Allergen Reactions   Contrast Media [Iodinated Contrast Media] Hives, Itching and Rash   Omeprazole Itching   Shellfish Allergy Nausea And Vomiting   Shellfish-Derived Products Nausea And Vomiting    Medications Reviewed Today     Reviewed by Viviano Simas, CMA (Certified Medical Assistant) on 10/29/22 at [phone]  Med List Status: <None>   Medication Order Taking? Sig Documenting Provider Last Dose Status Informant  amLODipine (NORVASC) 10 MG tablet [id number] Yes Take 1 tablet (10 mg total) by mouth daily. Cox, Kirsten, MD Taking Active   atorvastatin (LIPITOR) 40 MG tablet [id number] Yes Take 1 tablet (40 mg total) by mouth daily. Per ccm Janie Morning, NP Taking Active Self  Budeson-Glycopyrrol-Formoterol (BREZTRI AEROSPHERE) 160-9-4.8 MCG/ACT AERO [id number] Yes Inhale 2 puffs into the lungs in the morning and at bedtime. Blane Ohara, MD Taking Active Self           Med Note Michaela Corner   Tue Aug 14, 2022  2:39 PM) 08/14/22- reports not taking due to expense of medication- CCM Pharmacist notified  Cholecalciferol (VITAMIN D) 125 MCG (5000 UT) CAPS [id number] Yes Take 1 capsule by mouth daily. [provider] Taking Active Self  cyanocobalamin (VITAMIN B12) 1000 MCG/ML injection [id number] Yes Inject 1 mL (1,000 mcg total) into the muscle once a week. Cox, Kirsten, MD Taking Active   ferrous sulfate 325 (65 FE) MG tablet [id number] Yes Take 1 tablet (325 mg total) by mouth daily. Joycelyn Das, MD Taking Active            Med Note Michaela Corner   Tue Aug 14, 2022  2:41 PM) 08/14/22: Patient reports, "I am taking the vitamin pill;" she is unable to definitively state whether she has this medication or not  levalbuterol (XOPENEX) 1.25 MG/3ML nebulizer solution [id number] Yes Take 1.25 mg by nebulization 2 (two) times daily. [provider] Taking Active Self  Med Note Michaela Corner   Tue Aug 14, 2022  2:46 PM) 08/14/22: Patient reports not taking due to cost  nitroGLYCERIN (NITROSTAT) 0.4 MG SL tablet [id number] Yes Place 1 tablet (0.4 mg total) under the tongue every 5 (five) minutes as needed for chest pain. Lurline Del, FNP Taking Active Self  ondansetron (ZOFRAN) 8 MG tablet [id number] Yes Take 1 tablet (8 mg total) by mouth every 8 (eight) hours as needed for nausea or vomiting. Cox, Kirsten, MD Taking Active   ondansetron (ZOFRAN-ODT) 4 MG disintegrating tablet [id number] No Take 1 tablet (4 mg total) by mouth every 8 (eight) hours as needed for nausea or vomiting.  Patient not taking: Reported on 10/29/2022   Blane Ohara, MD Not Taking Active Self  OXYGEN [id number] No Inhale 2 L into the lungs continuous.  Patient not taking: Reported on 10/29/2022   [provider] Not Taking Active   pantoprazole (PROTONIX) 40 MG tablet [id number] Yes TAKE 1 TABLET(40 MG) BY MOUTH DAILY Cox, Kirsten, MD Taking Active   rOPINIRole (REQUIP) 2 MG tablet [id number] Yes TAKE 1 TABLET(2 MG) BY MOUTH IN THE MORNING AND AT BEDTIME  Patient taking differently: Take 2 mg by mouth 2 (two) times daily.   Janie Morning, NP Taking Active Self  sodium polystyrene (KAYEXALATE) powder [id number] Yes 15 gm dose four times a day x 3 days. Blane Ohara, MD Taking Active   SYNTHROID 50 MCG tablet [id number] Yes Take 1 tablet (50 mcg total) by mouth daily before breakfast. Cox, Kirsten, MD Taking Active   torsemide (DEMADEX) 20 MG tablet [id number] Yes Take 1 tablet (20 mg total) by mouth daily as needed (edema). Cox, Kirsten, MD Taking Active   traMADol Janean Sark) 50 MG tablet [id number] Yes Take by mouth. [provider] Taking Active   VENTOLIN HFA 108 (90 Base) MCG/ACT inhaler [id number] Yes Inhale 1-2 puffs into the lungs every 6 (six) hours as needed for wheezing or shortness of breath. Blane Ohara, MD Taking Active Self           Med Note Michaela Corner   Tue Aug 14, 2022  2:45  PM) 08/14/22: reports was given this medication at the hospital; reports is taking she "thinks"            SDOH:  (Social Determinants of Health) assessments and interventions performed: Yes SDOH Interventions    Flowsheet Row Care Coordination from 10/16/2022 in [facility] Coordination Care Coordination from 09/26/2022 in [facility] Care Coordination Care Coordination from 09/04/2022 in [facility] Coordination Telephone from 08/14/2022 in [facility] Care Coordination Care Coordination from 07/25/2022 in [facility] Coordination Telephone from 07/02/2022 in [facility] Coord[facility]  SDOH Interventions        Food Insecurity Interventions -- -- -- Intervention Not Indicated -- Intervention Not Indicated  Transportation Interventions -- -- -- Intervention Not Indicated  [today, patient reports her adult local children provide transportation] -- Intervention Not Indicated  [reports friends and/ or family members provide transportation]  Financial Strain Interventions -- Other (Comment)  [financial strain,  occasional difficulty paying bills or buying basic food items] -- -- Other (Comment)  [financial strain] --  Physical Activity Interventions Other (Comments)  [uses rollator to help her walk] Other (Comments)  [walking challenges,  uses rollator walker to help her ambulate] Other (Comments)  [risk for falls] -- Other (Comments)  [uses a walker to  help her walk] --  Stress Interventions Provide Counseling  [client has stress in managing care needs of her spouse] Provide Counseling  [stress over managing  medical needs. stress over financial needs] Provide Counseling  [has stress related to managing medical needs] -- Provide Counseling  [client has stress in managing medical needs. Also she has stress in managing needs of her spouse] --        Medication Assistance:   Breztri: -2023: Approved -2024: Waiting for patient to bring back paperwork (May 2024)  Medication Access: Name and location of current pharmacy:  [id number], [place] - [address] AT [facility] [address] [place] [address] Phone: [phone] Fax: [phone]  [facility] - [place], [place] - [address] [facility] [employer]. [address] [place] [address] Phone: [phone] Fax: [phone]  [facility] [address] [place] [address] Phone: [phone] Fax: [phone]    Compliance/Adherence/Medication fill history: Star Rating Drugs:  Medication:                Last Fill:         [facility] Supply Atorvastatin                 05/07/22-01/26/22 90ds   Care Gaps: Annual wellness visit in last year? 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 Was due for Entresto 4 days ago and states she still has 8 days of  pills left. Counseled on importance of taking. Also counseled on starting to write BP down. Will start Entresto PAP because willl be expensive once hits [facility] May 2024: Patient not writing down BP numbers. Will start testing and writing down so we get multiple readings     Hyperlipidemia: (LDL goal < 100) The 10-year ASCVD risk score (Arnett DK, et al., 2019) is: 50.3%   Values used to calculate the score:     Age: 23 years     Sex: Female     Is Non-Hispanic African American: No     Diabetic: No     Tobacco smoker: Yes     Systolic Blood Pressure: 148 mmHg     Is BP treated: Yes     HDL Cholesterol: 68 mg/dL     Total Cholesterol: 205 mg/dL Lab Results  Component Value Date   CHOL 205 (H) 10/05/2022   CHOL 110 02/27/2022   CHOL 133 10/24/2021   Lab Results  Component Value Date   HDL 68 10/05/2022   HDL 44 02/27/2022   HDL 55 10/24/2021   Lab Results  Component Value Date   LDLCALC 119 (H) 10/05/2022  LDLCALC 50 02/27/2022   LDLCALC 63 10/24/2021   Lab Results  Component Value Date   TRIG 100 10/05/2022   TRIG 77 02/27/2022   TRIG 75 10/24/2021   Lab Results  Component Value Date   CHOLHDL 3.0 10/05/2022   CHOLHDL 2.5 02/27/2022   CHOLHDL 2.4 10/24/2021   No results found for: "LDLDIRECT" Last vitamin D No results found for: "25OHVITD2", "25OHVITD3", "VD25OH" Lab Results  Component Value Date   TSH 15.600 (H) 09/17/2022  -Controlled -Current treatment: Atorvastatin 40 mg daily Appropriate, Query effective,  -Medications previously tried: none reported  -Current dietary habits: limited since gall bladder removal. Trying to find out what she can tolerate. Cooking makes her sick right now.  -Current exercise habits: moving around the house as she recovers.  -Denies hypotensive/hypertensive symptoms -Educated on Cholesterol goals;  Benefits of statin for ASCVD risk reduction; Exercise goal of 150 minutes per week; -Counseled on diet and exercise  extensively Feb 2023: Patient driving and didn't have meds today, unable to go to in-depth April 2023: Simvastatin dose too high to combo with Amlodipine, will let PCP know November 2023: Maintain therapy May 2024: Patient non-compliant on Atorvastatin. Will get new script sent in ASAP and told patient to pick up   Osteoporosis / Osteopenia (Goal Prevent fracture) -Uncontrolled -Last DEXA Scan: 08/10/21              T-Score femur Left:                         -08/10/21: -3.0                         -01/05/19: -2.2                         -01/03/15: -2.4                         -11/21/12: -2.6             T-Score femur Total:                         -08/10/21: -2.6                         -01/05/19: -2.1                         -01/03/15: -2.1                         -11/21/12: -2.2             T-Score forearm radius:                          -08/10/21: -4.6                         -01/05/19: -4.3 -Patient is a candidate for pharmacologic treatment due to T-Score<-2.5 -Current treatment  Prolia  June 2023 -Medications previously tried: Alendronate (Started 01/30/16) -Recommend [id number] units of vitamin D daily. April 2023: Recommend alternative therapy May 2024: Patient didn't get Prolia in December. Will coordinate ASAP     COPD (Goal: control symptoms and prevent exacerbations) -Controlled -Current treatment  Albuterol Appropriate, Effective, Safe,  Accessible Breztri Appropriate, Effective, Safe, Accessible 2023: Approved 2024: May, still waiting on patient to bring back paperwork -Medications previously tried: N/A  -Gold Grade: N/A -Current COPD Classification:  B (high sx, <2 exacerbations/yr) -MMRC/CAT score:     09/19/2021   11:01 AM  CAT Score  Total CAT Score 18  Pulmonary Functions Testing Results:  FEV1  Date Value Ref Range Status  11/24/2019 1.23 liters Corrected   FVC  Date Value Ref Range Status  11/24/2019 1.47 liters Corrected   FEV1/FVC  Date Value Ref Range  Status  11/24/2019 67.0 % Final   TLC  Date Value Ref Range Status  03/19/2018 5.18 L Final  -Exacerbations requiring treatment in last 6 months: None -Patient denies consistent use of maintenance inhaler -Counseled on Proper inhaler technique; May 2024: Will re-send PAP for Breztri  CP F/U May 2025

## 2022-11-06 ENCOUNTER — Telehealth: Payer: Self-pay

## 2022-11-06 NOTE — Telephone Encounter (Signed)
[facility], SEM-EL, W/SR, Joanne Gavel

## 2022-11-15 ENCOUNTER — Telehealth: Payer: Self-pay

## 2022-11-15 NOTE — Telephone Encounter (Signed)
Patient is calling requesting a D/C order for her oxygen. She states that they need a order faxed to them to have it d/c and then patient can call to them to come get the tanks.    Fax # [phone]

## 2022-11-16 NOTE — Telephone Encounter (Signed)
D/C order for oxygen written, signed and faxed.

## 2022-12-04 ENCOUNTER — Ambulatory Visit: Payer: Self-pay | Admitting: Licensed Clinical Social Worker

## 2022-12-04 NOTE — Patient Instructions (Signed)
Visit Information  Thank you for taking time to visit with me today. Please don't hesitate to contact me if I can be of assistance to you.   Following are the goals we discussed today:   Goals Addressed             This Visit's Progress    patient trying to manage her medical needs and manage needs of her spouse       Interventions:  LCSW spoke via phone today with client about client needs . Discussed family support. She said she has some support from her daughter and son. They both work during the week Discussed medication procurement. She said she drives to pharmacy as needed to obtain her prescribed medications Provided counseling support for client. Discussed client ambulation. She uses rollator walker to help her ambulate.  She said it is difficult to stand for a long period of time. She takes rest breaks as needed. She resides with her spouse in a mobile home. She said she can walk as needed in mobile home Client said she is no longer using oxygen equipment.. She said she is breathing well. She did not mention being short of breath. Discussed health needs of her spouse.  Discussed energy level of client. Client said she sometimes has reduced energy Reviewed edema issues. She has swelling in her lower legs at feet. She takes diurectic as prescribed. She has difficult sometimes wearing shoes. She wears bedroom slippers often Encouraged client to call LCSW as needed for SW support at [phone]          Our next appointment is by telephone on 02/05/23 at 3:30 PM   Please call the care guide team at [phone] if you need to cancel or reschedule your appointment.   If you are experiencing a Mental Health or Behavioral Health Crisis or need someone to talk to, please go to [facility] [address] [place] [phone])   The patient verbalized understanding of instructions, educational materials, and care plan provided today and  DECLINED offer to receive copy of patient instructions, educational materials, and care plan.   The patient has been provided with contact information for the care management team and has been advised to call with any health related questions or concerns.   Kelton Pillar.Danzel Marszalek MSW, LCSW Licensed Visual merchandiser [facility] Care Management [phone]

## 2022-12-04 NOTE — Patient Outreach (Signed)
  Care Coordination   Follow Up Visit Note   12/04/2022 Name: Alexandra [name] MRN: [id number] DOB: 18-Jul-1940  Alexandra [name] is a 82 y.o. year old female who sees Cox, Fritzi Mandes, MD for primary care. I spoke with  Alexandra [name] by phone today.  What matters to the patients health and wellness today?   Patient is trying to manage her medical needs  and manage the needs of her spouse   Goals Addressed             This Visit's Progress    patient trying to manage her medical needs and manage needs of her spouse       Interventions:  LCSW spoke via phone today with client about client needs . Discussed family support. She said she has some support from her daughter and son. They both work during the week Discussed medication procurement. She said she drives to pharmacy as needed to obtain her prescribed medications Provided counseling support for client. Discussed client ambulation. She uses rollator walker to help her ambulate.  She said it is difficult to stand for a long period of time. She takes rest breaks as needed. She resides with her spouse in a mobile home. She said she can walk as needed in mobile home Client said she is no longer using oxygen equipment.. She said she is breathing well. She did not mention being short of breath. Discussed health needs of her spouse.  Discussed energy level of client. Client said she sometimes has reduced energy Reviewed edema issues. She has swelling in her lower legs at feet. She takes diurectic as prescribed. She has difficult sometimes wearing shoes. She wears bedroom slippers often Encouraged client to call LCSW as needed for SW support at [phone]          SDOH assessments and interventions completed:  Yes  SDOH Interventions Today    Flowsheet Row Most Recent Value  SDOH Interventions   Physical Activity Interventions Other (Comments)  [uses walker as needed]  Stress Interventions Other (Comment)  [has some stress in  managing medical needs]        Care Coordination Interventions:  Yes, provided   Interventions Today    Flowsheet Row Most Recent Value  Chronic Disease   Chronic disease during today's visit Other  [spoke with client about client needs]  General Interventions   General Interventions Discussed/Reviewed General Interventions Discussed, Community Resources  [reviewed program support]  Exercise Interventions   Exercise Discussed/Reviewed Physical Activity  [uses a walker to help her walk]  Physical Activity Discussed/Reviewed Physical Activity Discussed  Education Interventions   Education Provided Provided Education  Provided Verbal Education On [employer] program support for client with RN, Teacher, early years/pre, LCSW]  Mental Health Interventions   Mental Health Discussed/Reviewed Anxiety  [client feels that her mood is stable. she did not mention any mood issues]  Nutrition Interventions   Nutrition Discussed/Reviewed Nutrition Discussed  [client enjoys cooking. Her daughter helps client procure needed food items]  Pharmacy Interventions   Pharmacy Dicussed/Reviewed Pharmacy Topics Discussed  Safety Interventions   Safety Discussed/Reviewed Fall Risk       Follow up plan: Follow up call scheduled for 02/05/23 at 3:30 PM     Encounter Outcome:  Pt. Visit Completed   Kelton Pillar.Arlys Scatena MSW, LCSW Licensed Visual merchandiser [facility] Care Management [phone]

## 2022-12-07 DIAGNOSIS — N289 Disorder of kidney and ureter, unspecified: Secondary | ICD-10-CM | POA: Diagnosis not present

## 2022-12-07 DIAGNOSIS — N390 Urinary tract infection, site not specified: Secondary | ICD-10-CM | POA: Diagnosis not present

## 2022-12-07 DIAGNOSIS — C672 Malignant neoplasm of lateral wall of bladder: Secondary | ICD-10-CM | POA: Diagnosis not present

## 2022-12-18 DIAGNOSIS — C679 Malignant neoplasm of bladder, unspecified: Secondary | ICD-10-CM | POA: Diagnosis not present

## 2022-12-18 DIAGNOSIS — N281 Cyst of kidney, acquired: Secondary | ICD-10-CM | POA: Diagnosis not present

## 2022-12-18 DIAGNOSIS — N1330 Unspecified hydronephrosis: Secondary | ICD-10-CM | POA: Diagnosis not present

## 2022-12-18 DIAGNOSIS — Z905 Acquired absence of kidney: Secondary | ICD-10-CM | POA: Diagnosis not present

## 2022-12-25 ENCOUNTER — Other Ambulatory Visit: Payer: Self-pay

## 2022-12-25 DIAGNOSIS — G2581 Restless legs syndrome: Secondary | ICD-10-CM

## 2022-12-25 MED ORDER — ROPINIROLE HCL 2 MG PO TABS
2.0000 mg | ORAL_TABLET | Freq: Two times a day (BID) | ORAL | 2 refills | Status: DC
Start: 2022-12-25 — End: 2023-07-22

## 2022-12-31 DIAGNOSIS — I10 Essential (primary) hypertension: Secondary | ICD-10-CM | POA: Diagnosis not present

## 2022-12-31 DIAGNOSIS — J449 Chronic obstructive pulmonary disease, unspecified: Secondary | ICD-10-CM | POA: Diagnosis not present

## 2022-12-31 DIAGNOSIS — Z8551 Personal history of malignant neoplasm of bladder: Secondary | ICD-10-CM | POA: Diagnosis not present

## 2022-12-31 DIAGNOSIS — Z79899 Other long term (current) drug therapy: Secondary | ICD-10-CM | POA: Diagnosis not present

## 2022-12-31 DIAGNOSIS — N132 Hydronephrosis with renal and ureteral calculous obstruction: Secondary | ICD-10-CM | POA: Diagnosis not present

## 2022-12-31 DIAGNOSIS — E210 Primary hyperparathyroidism: Secondary | ICD-10-CM | POA: Diagnosis not present

## 2022-12-31 DIAGNOSIS — C672 Malignant neoplasm of lateral wall of bladder: Secondary | ICD-10-CM | POA: Diagnosis not present

## 2022-12-31 DIAGNOSIS — K219 Gastro-esophageal reflux disease without esophagitis: Secondary | ICD-10-CM | POA: Diagnosis not present

## 2022-12-31 DIAGNOSIS — N131 Hydronephrosis with ureteral stricture, not elsewhere classified: Secondary | ICD-10-CM | POA: Diagnosis not present

## 2022-12-31 DIAGNOSIS — N135 Crossing vessel and stricture of ureter without hydronephrosis: Secondary | ICD-10-CM | POA: Diagnosis not present

## 2022-12-31 DIAGNOSIS — N1330 Unspecified hydronephrosis: Secondary | ICD-10-CM | POA: Diagnosis not present

## 2023-01-07 ENCOUNTER — Ambulatory Visit (INDEPENDENT_AMBULATORY_CARE_PROVIDER_SITE_OTHER): Payer: Medicare Other | Admitting: Physician Assistant

## 2023-01-07 ENCOUNTER — Encounter: Payer: Self-pay | Admitting: Physician Assistant

## 2023-01-07 VITALS — BP 144/64 | HR 84 | Temp 96.7°F | Resp 18 | Ht 62.0 in | Wt 150.0 lb

## 2023-01-07 DIAGNOSIS — M25551 Pain in right hip: Secondary | ICD-10-CM | POA: Diagnosis not present

## 2023-01-07 DIAGNOSIS — E875 Hyperkalemia: Secondary | ICD-10-CM | POA: Diagnosis not present

## 2023-01-07 DIAGNOSIS — M25552 Pain in left hip: Secondary | ICD-10-CM

## 2023-01-07 DIAGNOSIS — G4700 Insomnia, unspecified: Secondary | ICD-10-CM

## 2023-01-07 DIAGNOSIS — G8929 Other chronic pain: Secondary | ICD-10-CM | POA: Diagnosis not present

## 2023-01-07 DIAGNOSIS — G479 Sleep disorder, unspecified: Secondary | ICD-10-CM | POA: Insufficient documentation

## 2023-01-07 MED ORDER — LOKELMA 5 G PO PACK
5.0000 g | PACK | Freq: Every day | ORAL | 1 refills | Status: DC
Start: 2023-01-07 — End: 2023-01-21

## 2023-01-07 MED ORDER — MELATONIN 1 MG PO TABS
1.0000 mg | ORAL_TABLET | Freq: Every day | ORAL | 1 refills | Status: DC
Start: 2023-01-07 — End: 2023-11-11

## 2023-01-07 NOTE — Assessment & Plan Note (Signed)
Prescribed Melatonin 1mg  Patient will follow up at next visit

## 2023-01-07 NOTE — Progress Notes (Signed)
Acute Office Visit  Subjective:    Patient ID: Alexandra [name], female    DOB: Feb 24, 1941, 82 y.o.   MRN: [id number]  Chief Complaint  Patient presents with   Hip Pain    Right     HPI: Patient is in today for right hip pain which started on Sunday.  She reports no falls or no injury.  Has a history of receiving injections from Dr.Perry. States the injections were aimed for deep in the joint to help with the pain. States she did have previous injections done at orthopedic office as well, but I was unable to find a note from that specific office.  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 82 yo   BACK SURGERY  2016   lower L 4 to L 5   bladder tack and uterus lift  10/10/2015   CHOLECYSTECTOMY     summer of 2022   CYSTOSCOPY WITH RETROGRADE PYELOGRAM, URETEROSCOPY AND STENT PLACEMENT Right 08/04/2022   Procedure: CYSTOSCOPY WITH RIGHT URETERAL STENT EXCHANGE;  Surgeon: Despina Arias, MD;  Location: [facility] OR;  Service: Urology;  Laterality: Right;   IR RADIOLOGIST EVAL & MGMT  12/03/2018   IR RADIOLOGIST EVAL & MGMT  12/31/2018   IR REMOVAL TUN ACCESS W/ PORT W/O FL MOD SED  08/08/2022   PARATHYROIDECTOMY Left 07/02/2016   Procedure: LEFT INFERIOR PARATHYROIDECTOMY;  Surgeon: Darnell Level, MD;  Location: [facility] ORS;  Service: General;  Laterality: Left;   SHOULDER OPEN ROTATOR CUFF REPAIR  12/2019   TEE WITHOUT CARDIOVERSION N/A 08/10/2022   Procedure: TRANSESOPHAGEAL ECHOCARDIOGRAM (TEE);  Surgeon: Chrystie Nose, MD;   Location: [facility] ENDOSCOPY;  Service: Cardiovascular;  Laterality: N/A;   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    Family History  Problem Relation Age of Onset   Emphysema Mother    Hyperlipidemia Sister    Hypertension Sister    COPD Sister    Hyperlipidemia Sister    Hypertension Sister    Lymphoma Sister    Obesity Brother    Hyperlipidemia Brother    Hypertension Brother    Breast cancer Neg Hx     Social History   Socioeconomic History   Marital status: Married    Spouse name: Molly Maduro   Number of children: 2   Years of education: 11   Highest education level: 11th grade  Occupational History   Occupation: Retired  Tobacco Use   Smoking status: Former    Current packs/day: 0.00    Average packs/day: 1 pack/day for 50.0 years (50.0 ttl pk-yrs)    Types: Cigarettes    Start date: 06/12/1955    Quit date: 06/11/2005    Years since quitting: 17.5   Smokeless tobacco: Never  Vaping Use   Vaping status: Never Used  Substance and Sexual Activity   Alcohol use: No   Drug use: No   Sexual activity: Yes    Partners: Male  Other Topics Concern   Not on file  Social History Narrative   Not on file   Social Determinants of Health   Financial Resource Strain: Medium Risk (09/26/2022)   Overall Financial Resource Strain (CARDIA)    Difficulty of Paying Living Expenses: Somewhat hard  Food Insecurity: No Food Insecurity (08/14/2022)   Hunger Vital Sign    Worried About Running Out of Food in the Last Year: Never true    Ran Out of Food in the Last Year: Never true  Transportation Needs: No Transportation Needs (08/14/2022)   PRAPARE - Administrator, Civil Service (Medical): No    Lack of Transportation (Non-Medical): No  Physical Activity: Inactive (12/04/2022)   Exercise Vital Sign    Days of Exercise per Week: 0 days    Minutes of Exercise per Session: 0 min  Stress: Stress Concern Present (12/04/2022)   [employer] of Occupational Health -  Occupational Stress Questionnaire    Feeling of Stress : To some extent  Social Connections: Moderately Integrated (02/15/2022)   Social Connection and Isolation Panel [NHANES]    Frequency of Communication with Friends and Family: More than three times a week    Frequency of Social Gatherings with Friends and Family: More than three times a week    Attends Religious Services: More than 4 times per year    Active Member of [employer] or Organizations: No    Attends Banker Meetings: Never    Marital Status: Married  Catering manager Violence: Not At Risk (08/04/2022)   Humiliation, Afraid, Rape, and Kick questionnaire    Fear of Current or Ex-Partner: No    Emotionally Abused: No    Physically Abused: No    Sexually Abused: No    Outpatient Medications Prior to Visit  Medication Sig Dispense Refill   amLODipine (NORVASC) 10 MG tablet Take 1 tablet (10 mg total) by mouth daily. 90 tablet 1   atorvastatin (LIPITOR) 40 MG tablet Take 1 tablet (40 mg total) by mouth daily. Per ccm 90 tablet 1   Budeson-Glycopyrrol-Formoterol (BREZTRI AEROSPHERE) 160-9-4.8 MCG/ACT AERO Inhale 2 puffs into the lungs in the morning and at bedtime. 10.7 g 4   Cholecalciferol (VITAMIN D) 125 MCG (5000 UT) CAPS Take 1 capsule by mouth daily.     cyanocobalamin (VITAMIN B12) 1000 MCG/ML injection Inject 1 mL (1,000 mcg total) into the muscle once a week. 4 mL 1   ferrous sulfate 325 (65 FE) MG tablet Take 1 tablet (325 mg total) by mouth daily. 90 tablet 0   levalbuterol (XOPENEX) 1.25 MG/3ML nebulizer solution Take 1.25 mg by nebulization 2 (two) times daily.     nitroGLYCERIN (NITROSTAT) 0.4 MG SL tablet Place 1 tablet (0.4 mg total) under the tongue every 5 (five) minutes as needed for chest pain. 30 tablet 3   ondansetron (ZOFRAN) 8 MG tablet Take 1 tablet (8 mg total) by mouth every 8 (eight) hours as needed for nausea or vomiting. 90 tablet 0   ondansetron (ZOFRAN-ODT) 4 MG disintegrating tablet Take 1  tablet (4 mg total) by mouth every 8 (eight) hours as needed for nausea or vomiting. (Patient not taking: Reported on 10/29/2022) 20 tablet 0   OXYGEN Inhale 2 L into the lungs continuous. (Patient not taking: Reported on 10/29/2022)     pantoprazole (PROTONIX) 40 MG tablet TAKE 1 TABLET(40 MG) BY MOUTH DAILY 90 tablet 1   rOPINIRole (REQUIP) 2 MG tablet Take 1 tablet (2 mg total) by mouth 2 (two) times daily. 60 tablet 2  sodium polystyrene (KAYEXALATE) powder 15 gm dose four times a day x 3 days. 454 g 0   SYNTHROID 50 MCG tablet Take 1 tablet (50 mcg total) by mouth daily before breakfast. 30 tablet 1   torsemide (DEMADEX) 20 MG tablet Take 1 tablet (20 mg total) by mouth daily as needed (edema). 30 tablet 2   traMADol (ULTRAM) 50 MG tablet Take by mouth.     VENTOLIN HFA 108 (90 Base) MCG/ACT inhaler Inhale 1-2 puffs into the lungs every 6 (six) hours as needed for wheezing or shortness of breath. 18 g 2   No facility-administered medications prior to visit.    Allergies  Allergen Reactions   Contrast Media [Iodinated Contrast Media] Hives, Itching and Rash   Omeprazole Itching   Shellfish Allergy Nausea And Vomiting   Shellfish-Derived Products Nausea And Vomiting    Review of Systems  Constitutional:  Negative for chills, fatigue and fever.  HENT:  Negative for congestion, ear pain, rhinorrhea and sore throat.   Respiratory:  Negative for cough and shortness of breath.   Cardiovascular:  Positive for leg swelling. Negative for chest pain and palpitations.  Gastrointestinal:  Negative for abdominal pain, constipation, diarrhea, nausea and vomiting.  Genitourinary:  Negative for dysuria and urgency.  Musculoskeletal:  Positive for arthralgias (right hip pain). Negative for back pain and myalgias.  Neurological:  Positive for light-headedness. Negative for dizziness, weakness and headaches.  Psychiatric/Behavioral:  Negative for dysphoric mood. The patient is not nervous/anxious.         Objective:        01/07/2023    1:32 PM 10/29/2022    8:20 AM 10/05/2022    9:05 AM  Vitals with BMI  Height 5\' 2"  5\' 2"  5\' 2"   Weight 150 lbs 141 lbs 10 oz 137 lbs  BMI 27.43 25.89 25.05  Systolic 144 148 010  Diastolic 64 78 68  Pulse 84 96 84    No data found.   Physical Exam Vitals reviewed.  Constitutional:      Appearance: Normal appearance.  Cardiovascular:     Rate and Rhythm: Normal rate and regular rhythm.     Heart sounds: Normal heart sounds.  Pulmonary:     Effort: Pulmonary effort is normal.     Breath sounds: Normal breath sounds.  Abdominal:     General: Bowel sounds are normal.     Palpations: Abdomen is soft.     Tenderness: There is no abdominal tenderness.  Musculoskeletal:        General: Tenderness present.  Neurological:     Mental Status: She is alert and oriented to person, place, and time.  Psychiatric:        Mood and Affect: Mood normal.        Behavior: Behavior normal.   Total time spent on today's visit was greater than 30 minutes, including both face-to-face time and nonface-to-face time personally spent on review of chart (labs and imaging), discussing labs and goals, discussing further work-up, treatment options, referrals to specialist if needed, reviewing outside records of pertinent, answering patient's questions, and coordinating care.   Health Maintenance Due  Topic Date Due   Hepatitis C Screening  Never done   Pneumonia Vaccine 43+ Years old (2 of 2 - PPSV23 or PCV20) 05/22/2017   Zoster Vaccines- Shingrix (2 of 2) 10/12/2021   Medicare Annual Wellness (AWV)  02/16/2023    There are no preventive care reminders to display for this patient.   Lab  Results  Component Value Date   TSH 15.600 (H) 09/17/2022   Lab Results  Component Value Date   WBC 8.3 10/05/2022   HGB 9.2 (L) 10/05/2022   HCT 28.3 (L) 10/05/2022   MCV 91 10/05/2022   PLT 337 10/05/2022   Lab Results  Component Value Date   NA 140  10/15/2022   K 5.2 10/15/2022   CO2 18 (L) 10/15/2022   GLUCOSE 90 10/15/2022   BUN 50 (H) 10/15/2022   CREATININE 3.74 (H) 10/15/2022   BILITOT 0.2 10/15/2022   ALKPHOS 89 10/15/2022   AST 16 10/15/2022   ALT 12 10/15/2022   PROT 6.2 10/15/2022   ALBUMIN 3.7 (L) 10/15/2022   CALCIUM 9.3 10/15/2022   ANIONGAP 9 08/13/2022   EGFR 12 (L) 10/15/2022   Lab Results  Component Value Date   CHOL 205 (H) 10/05/2022   Lab Results  Component Value Date   HDL 68 10/05/2022   Lab Results  Component Value Date   LDLCALC 119 (H) 10/05/2022   Lab Results  Component Value Date   TRIG 100 10/05/2022   Lab Results  Component Value Date   CHOLHDL 3.0 10/05/2022   Lab Results  Component Value Date   HGBA1C 5.6 04/04/2021       Assessment & Plan:  Difficulty falling asleep at night until early morning hours Assessment & Plan: Prescribed Melatonin 1mg  Patient will follow up at next visit  Orders: -     Melatonin; Take 1 tablet (1 mg total) by mouth at bedtime.  Dispense: 30 tablet; Refill: 1  Hyperkalemia Assessment & Plan: Refilled Lokelma 5mg  she had prescribed from previous hospital stay  Orders: -     Lokelma; Take 5 g by mouth daily.  Dispense: 30 packet; Refill: 1  Chronic pain of both hips Assessment & Plan: Referral sent to Orthopedic surgeon for injection Will continue to monitor  Orders: -     Ambulatory referral to Orthopedics     Meds ordered this encounter  Medications   melatonin 1 MG TABS tablet    Sig: Take 1 tablet (1 mg total) by mouth at bedtime.    Dispense:  30 tablet    Refill:  1   sodium zirconium cyclosilicate (LOKELMA) 5 g packet    Sig: Take 5 g by mouth daily.    Dispense:  30 packet    Refill:  1    Orders Placed This Encounter  Procedures   Ambulatory referral to Orthopedics     Follow-up: Return if symptoms worsen or fail to improve.  An After Visit Summary was printed and given to the patient.  Langley Gauss, [facility] [phone]

## 2023-01-07 NOTE — Assessment & Plan Note (Signed)
Referral sent to Orthopedic surgeon for injection Will continue to monitor

## 2023-01-07 NOTE — Assessment & Plan Note (Signed)
Refilled Lokelma 5mg  she had prescribed from previous hospital stay

## 2023-01-08 ENCOUNTER — Telehealth: Payer: Self-pay

## 2023-01-08 ENCOUNTER — Other Ambulatory Visit: Payer: Self-pay | Admitting: Pharmacist

## 2023-01-08 NOTE — Telephone Encounter (Signed)
Patient called stating that Insurance would not cover the [facility] that we sent to the pharmacy from yesterday's appointment for her hospital follow up. Patient stated that the medication was going to cost her 350$. Please advise.

## 2023-01-08 NOTE — Progress Notes (Signed)
Care Coordination Call  Received message from [facility] that patient could not afford refill on Lokelma. Contacted pharmacy, patient has likely not met her deductible for the year.   14 packs of Lokelma 5 mg daily remaining. She has been taking about every other day.   Asked if she sees nephrology; she notes that she stopped seeing Dr. Signe Colt in [place] as she didn't feel like they were doing anything for her besides check labs.   Coordinating with [facility] pharmacy team to see if [facility] has any Lokelma samples.  Will mail patient assistance application for [facility], and will include Breztri.   Catie Eppie Gibson, PharmD, BCACP, CPP Clinical Pharmacist [facility] [phone]

## 2023-01-11 ENCOUNTER — Other Ambulatory Visit: Payer: Self-pay | Admitting: Family Medicine

## 2023-01-11 DIAGNOSIS — I5032 Chronic diastolic (congestive) heart failure: Secondary | ICD-10-CM

## 2023-01-15 ENCOUNTER — Telehealth: Payer: Self-pay

## 2023-01-15 NOTE — Telephone Encounter (Signed)
Mailed AZ&ME application to patients home for assistance with [place] and Lokelma.

## 2023-01-15 NOTE — Telephone Encounter (Signed)
-----   Message from [employer] sent at 01/08/2023  3:58 PM EDT ----- Please mail patient [place] application for Lokelma 5 mg and Breztri. Please go ahead and fax provider portion to Dr. Sedalia Muta, patient has a small supply of medication at this time. Thanks!

## 2023-01-21 ENCOUNTER — Encounter: Payer: Self-pay | Admitting: Physician Assistant

## 2023-01-21 ENCOUNTER — Ambulatory Visit (INDEPENDENT_AMBULATORY_CARE_PROVIDER_SITE_OTHER): Payer: Medicare Other | Admitting: Physician Assistant

## 2023-01-21 VITALS — BP 140/70 | HR 94 | Temp 97.1°F | Ht 62.0 in | Wt 138.6 lb

## 2023-01-21 DIAGNOSIS — J449 Chronic obstructive pulmonary disease, unspecified: Secondary | ICD-10-CM | POA: Diagnosis not present

## 2023-01-21 DIAGNOSIS — J069 Acute upper respiratory infection, unspecified: Secondary | ICD-10-CM | POA: Insufficient documentation

## 2023-01-21 DIAGNOSIS — J029 Acute pharyngitis, unspecified: Secondary | ICD-10-CM | POA: Diagnosis not present

## 2023-01-21 DIAGNOSIS — M5416 Radiculopathy, lumbar region: Secondary | ICD-10-CM | POA: Diagnosis not present

## 2023-01-21 DIAGNOSIS — M47817 Spondylosis without myelopathy or radiculopathy, lumbosacral region: Secondary | ICD-10-CM | POA: Diagnosis not present

## 2023-01-21 DIAGNOSIS — Z981 Arthrodesis status: Secondary | ICD-10-CM | POA: Diagnosis not present

## 2023-01-21 LAB — POCT INFLUENZA A/B
Influenza A, POC: NEGATIVE
Influenza B, POC: NEGATIVE

## 2023-01-21 LAB — POCT RAPID STREP A (OFFICE): Rapid Strep A Screen: NEGATIVE

## 2023-01-21 LAB — POC COVID19 BINAXNOW: SARS Coronavirus 2 Ag: NEGATIVE

## 2023-01-21 MED ORDER — BENZONATATE 100 MG PO CAPS: 100.0000 mg | ORAL_CAPSULE | Freq: Three times a day (TID) | ORAL | 1 refills | Status: DC | PRN

## 2023-01-21 MED ORDER — PREDNISONE 20 MG PO TABS
ORAL_TABLET | ORAL | 0 refills | Status: AC
Start: 2023-01-21 — End: 2023-01-29

## 2023-01-21 NOTE — Assessment & Plan Note (Signed)
Strep test negative Flu negative.

## 2023-01-21 NOTE — Assessment & Plan Note (Addendum)
Given Breztri [employer] use Breztri today and start Prednisone tomorrow Discussed using Breztri more to help prevent exacerbations.  Will send antibiotic if symptoms continue to worsen, recently finished antibiotic for kidney infection.  Patient has follow up on Friday and will inform us if its worse

## 2023-01-21 NOTE — Progress Notes (Signed)
Acute Office Visit  Subjective:    Patient ID: Alexandra [name], female    DOB: 06/25/1940, 82 y.o.   MRN: [id number]  Chief Complaint  Patient presents with   Cough   Sore Throat    HPI: Patient is in today with complains of cough and Sore throat.   Upper respiratory symptoms She complains of post nasal drip and productive cough with  clear colored sputum.with no fever, chills, night sweats or weight loss. Onset of symptoms was yesterday and staying constant.She is drinking plenty of fluids.  Past history is significant for COPD. Patient is former smoker, quit 20+ years ago. Patient has not been using her Markus Daft and says she has not gotten any from the pharmacy.   Got elastic socks to help with swelling from the local elastic store. States they have helped a lot with her swelling.   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 82 yo   BACK SURGERY  2016   lower L 4 to L 5   bladder tack and uterus lift  10/10/2015   CHOLECYSTECTOMY     summer of 2022   CYSTOSCOPY WITH RETROGRADE PYELOGRAM, URETEROSCOPY AND STENT PLACEMENT Right 08/04/2022   Procedure: CYSTOSCOPY WITH RIGHT URETERAL STENT EXCHANGE;  Surgeon: Despina Arias, MD;  Location: [facility] OR;  Service: Urology;  Laterality: Right;   IR RADIOLOGIST EVAL & MGMT  12/03/2018   IR RADIOLOGIST EVAL & MGMT  12/31/2018   IR REMOVAL TUN ACCESS W/ PORT W/O FL MOD SED  08/08/2022   PARATHYROIDECTOMY Left 07/02/2016   Procedure: LEFT INFERIOR PARATHYROIDECTOMY;   Surgeon: Darnell Level, MD;  Location: [facility] ORS;  Service: General;  Laterality: Left;   SHOULDER OPEN ROTATOR CUFF REPAIR  12/2019   TEE WITHOUT CARDIOVERSION N/A 08/10/2022   Procedure: TRANSESOPHAGEAL ECHOCARDIOGRAM (TEE);  Surgeon: Chrystie Nose, MD;  Location: [facility] ENDOSCOPY;  Service: Cardiovascular;  Laterality: N/A;   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    Family History  Problem Relation Age of Onset   Emphysema Mother    Hyperlipidemia Sister    Hypertension Sister    COPD Sister    Hyperlipidemia Sister    Hypertension Sister    Lymphoma Sister    Obesity Brother    Hyperlipidemia Brother    Hypertension Brother    Breast cancer Neg Hx     Social History   Socioeconomic History   Marital status: Married    Spouse name: Molly Maduro   Number of children: 2   Years of education: 11   Highest education level: 11th grade  Occupational History   Occupation: Retired  Tobacco Use   Smoking status: Former    Current packs/day: 0.00    Average packs/day: 1 pack/day for 50.0 years (50.0 ttl pk-yrs)    Types: Cigarettes    Start date: 06/12/1955    Quit date: 06/11/2005    Years since quitting: 17.6   Smokeless tobacco: Never  Vaping Use   Vaping status:  Never Used  Substance and Sexual Activity   Alcohol use: No   Drug use: No   Sexual activity: Yes    Partners: Male  Other Topics Concern   Not on file  Social History Narrative   Not on file   Social Determinants of Health   Financial Resource Strain: Medium Risk (09/26/2022)   Overall Financial Resource Strain (CARDIA)    Difficulty of Paying Living Expenses: Somewhat hard  Food Insecurity: No Food Insecurity (08/14/2022)   Hunger Vital Sign    Worried About Running Out of Food in the Last Year: Never true    Ran Out of Food in the Last Year: Never true  Transportation Needs: No Transportation Needs (08/14/2022)   PRAPARE - Administrator, Civil Service (Medical): No    Lack of Transportation  (Non-Medical): No  Physical Activity: Inactive (12/04/2022)   Exercise Vital Sign    Days of Exercise per Week: 0 days    Minutes of Exercise per Session: 0 min  Stress: Stress Concern Present (12/04/2022)   [employer] of Occupational Health - Occupational Stress Questionnaire    Feeling of Stress : To some extent  Social Connections: Moderately Integrated (02/15/2022)   Social Connection and Isolation Panel [NHANES]    Frequency of Communication with Friends and Family: More than three times a week    Frequency of Social Gatherings with Friends and Family: More than three times a week    Attends Religious Services: More than 4 times per year    Active Member of [employer] or Organizations: No    Attends Banker Meetings: Never    Marital Status: Married  Catering manager Violence: Not At Risk (08/04/2022)   Humiliation, Afraid, Rape, and Kick questionnaire    Fear of Current or Ex-Partner: No    Emotionally Abused: No    Physically Abused: No    Sexually Abused: No    Outpatient Medications Prior to Visit  Medication Sig Dispense Refill   amLODipine (NORVASC) 10 MG tablet TAKE 1 TABLET(10 MG) BY MOUTH DAILY 90 tablet 1   atorvastatin (LIPITOR) 40 MG tablet Take 1 tablet (40 mg total) by mouth daily. Per ccm 90 tablet 1   Budeson-Glycopyrrol-Formoterol (BREZTRI AEROSPHERE) 160-9-4.8 MCG/ACT AERO Inhale 2 puffs into the lungs in the morning and at bedtime. 10.7 g 4   Cholecalciferol (VITAMIN D) 125 MCG (5000 UT) CAPS Take 1 capsule by mouth daily.     cyanocobalamin (VITAMIN B12) 1000 MCG/ML injection Inject 1 mL (1,000 mcg total) into the muscle once a week. 4 mL 1   levalbuterol (XOPENEX) 1.25 MG/3ML nebulizer solution Take 1.25 mg by nebulization 2 (two) times daily.     melatonin 1 MG TABS tablet Take 1 tablet (1 mg total) by mouth at bedtime. 30 tablet 1   nitroGLYCERIN (NITROSTAT) 0.4 MG SL tablet Place 1 tablet (0.4 mg total) under the tongue every 5 (five) minutes as  needed for chest pain. 30 tablet 3   pantoprazole (PROTONIX) 40 MG tablet TAKE 1 TABLET(40 MG) BY MOUTH DAILY 90 tablet 1   rOPINIRole (REQUIP) 2 MG tablet Take 1 tablet (2 mg total) by mouth 2 (two) times daily. 60 tablet 2   sodium polystyrene (KAYEXALATE) powder 15 gm dose four times a day x 3 days. 454 g 0   SYNTHROID 50 MCG tablet Take 1 tablet (50 mcg total) by mouth daily before breakfast. 30 tablet 1   torsemide (DEMADEX) 20 MG tablet Take 1  tablet (20 mg total) by mouth daily as needed (edema). 30 tablet 2   traMADol (ULTRAM) 50 MG tablet Take by mouth.     VENTOLIN HFA 108 (90 Base) MCG/ACT inhaler Inhale 1-2 puffs into the lungs every 6 (six) hours as needed for wheezing or shortness of breath. 18 g 2   ferrous sulfate 325 (65 FE) MG tablet Take 1 tablet (325 mg total) by mouth daily. 90 tablet 0   ondansetron (ZOFRAN) 8 MG tablet Take 1 tablet (8 mg total) by mouth every 8 (eight) hours as needed for nausea or vomiting. 90 tablet 0   ondansetron (ZOFRAN-ODT) 4 MG disintegrating tablet Take 1 tablet (4 mg total) by mouth every 8 (eight) hours as needed for nausea or vomiting. (Patient not taking: Reported on 10/29/2022) 20 tablet 0   OXYGEN Inhale 2 L into the lungs continuous. (Patient not taking: Reported on 10/29/2022)     sodium zirconium cyclosilicate (LOKELMA) 5 g packet Take 5 g by mouth daily. (Patient not taking: Reported on 01/08/2023) 30 packet 1   No facility-administered medications prior to visit.    Allergies  Allergen Reactions   Contrast Media [Iodinated Contrast Media] Hives, Itching and Rash   Omeprazole Itching   Shellfish Allergy Nausea And Vomiting   Shellfish-Derived Products Nausea And Vomiting    Review of Systems  Constitutional:  Negative for fatigue.  HENT:  Positive for sore throat. Negative for congestion and ear pain.   Respiratory:  Positive for cough. Negative for shortness of breath.   Cardiovascular:  Negative for chest pain.  Gastrointestinal:   Negative for abdominal pain, constipation, diarrhea, nausea and vomiting.  Genitourinary:  Negative for dysuria, frequency and urgency.  Musculoskeletal:  Negative for arthralgias, back pain and myalgias.  Neurological:  Negative for dizziness and headaches.  Psychiatric/Behavioral:  Negative for agitation and sleep disturbance. The patient is not nervous/anxious.        Objective:        01/21/2023   10:02 AM 01/07/2023    1:32 PM 10/29/2022    8:20 AM  Vitals with BMI  Height 5\' 2"  5\' 2"  5\' 2"   Weight 138 lbs 10 oz 150 lbs 141 lbs 10 oz  BMI 25.34 27.43 25.89  Systolic 140 144 829  Diastolic 70 64 78  Pulse 94 84 96    Orthostatic VS for the past 72 hrs (Last 3 readings):  Patient Position BP Location Cuff Size  01/21/23 1002 Sitting Left Arm Normal     Physical Exam  Health Maintenance Due  Topic Date Due   Hepatitis C Screening  Never done   Pneumonia Vaccine 73+ Years old (2 of 2 - PPSV23 or PCV20) 05/22/2017   Zoster Vaccines- Shingrix (2 of 2) 10/12/2021   INFLUENZA VACCINE  01/10/2023   Medicare Annual Wellness (AWV)  02/16/2023    There are no preventive care reminders to display for this patient.   Lab Results  Component Value Date   TSH 15.600 (H) 09/17/2022   Lab Results  Component Value Date   WBC 8.3 10/05/2022   HGB 9.2 (L) 10/05/2022   HCT 28.3 (L) 10/05/2022   MCV 91 10/05/2022   PLT 337 10/05/2022   Lab Results  Component Value Date   NA 140 10/15/2022   K 5.2 10/15/2022   CO2 18 (L) 10/15/2022   GLUCOSE 90 10/15/2022   BUN 50 (H) 10/15/2022   CREATININE 3.74 (H) 10/15/2022   BILITOT 0.2 10/15/2022   ALKPHOS 89  10/15/2022   AST 16 10/15/2022   ALT 12 10/15/2022   PROT 6.2 10/15/2022   ALBUMIN 3.7 (L) 10/15/2022   CALCIUM 9.3 10/15/2022   ANIONGAP 9 08/13/2022   EGFR 12 (L) 10/15/2022   Lab Results  Component Value Date   CHOL 205 (H) 10/05/2022   Lab Results  Component Value Date   HDL 68 10/05/2022   Lab Results   Component Value Date   LDLCALC 119 (H) 10/05/2022   Lab Results  Component Value Date   TRIG 100 10/05/2022   Lab Results  Component Value Date   CHOLHDL 3.0 10/05/2022   Lab Results  Component Value Date   HGBA1C 5.6 04/04/2021       Assessment & Plan:  COPD, moderate (HCC) Assessment & Plan: Given Breztri sample Will use Breztri today and start Prednisone tomorrow Discussed using Breztri more to help prevent exacerbations.  Will send antibiotic if symptoms continue to worsen, recently finished antibiotic for kidney infection.  Patient has follow up on Friday and will inform us if its worse   Acute URI -     POC COVID-19 BinaxNow -     POCT Influenza A/B -     POCT rapid strep A -     predniSONE; Take 3 tablets (60 mg total) by mouth daily with breakfast for 3 days, THEN 2 tablets (40 mg total) daily with breakfast for 3 days, THEN 1 tablet (20 mg total) daily with breakfast for 3 days.  Dispense: 18 tablet; Refill: 0 -     Benzonatate; Take 1 capsule (100 mg total) by mouth 3 (three) times daily as needed for cough.  Dispense: 30 capsule; Refill: 1  Sorethroat Assessment & Plan: Strep test negative Flu negative.   Orders: -     POC COVID-19 BinaxNow -     POCT Influenza A/B -     POCT rapid strep A     Meds ordered this encounter  Medications   predniSONE (DELTASONE) 20 MG tablet    Sig: Take 3 tablets (60 mg total) by mouth daily with breakfast for 3 days, THEN 2 tablets (40 mg total) daily with breakfast for 3 days, THEN 1 tablet (20 mg total) daily with breakfast for 3 days.    Dispense:  18 tablet    Refill:  0   benzonatate (TESSALON PERLES) 100 MG capsule    Sig: Take 1 capsule (100 mg total) by mouth 3 (three) times daily as needed for cough.    Dispense:  30 capsule    Refill:  1    Orders Placed This Encounter  Procedures   POC COVID-19   Influenza A/B   POCT rapid strep A     Follow-up: Return if symptoms worsen or fail to improve.  An  After Visit Summary was printed and given to the patient.  Langley Gauss, [facility] [phone]

## 2023-01-24 DIAGNOSIS — M6281 Muscle weakness (generalized): Secondary | ICD-10-CM | POA: Diagnosis not present

## 2023-01-24 DIAGNOSIS — M5416 Radiculopathy, lumbar region: Secondary | ICD-10-CM | POA: Diagnosis not present

## 2023-01-24 DIAGNOSIS — R293 Abnormal posture: Secondary | ICD-10-CM | POA: Diagnosis not present

## 2023-01-24 DIAGNOSIS — R2689 Other abnormalities of gait and mobility: Secondary | ICD-10-CM | POA: Diagnosis not present

## 2023-01-24 NOTE — Progress Notes (Unsigned)
Subjective:  Patient ID: Alexandra [name], female    DOB: 1940/12/18  Age: 82 y.o. MRN: [id number]  Chief Complaint  Patient presents with   Medical Management of Chronic Issues    HPI CKD stage 5.   Osteoporosis:  vitamin D 40102 units daily.    Hyperlipidemia:  Lipitor 40 mg daily.    Bladder Cancer: sees Dr. Saddie Benders.  Off of chemotherapy for several months.    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   RLS: on ropinirole 2 mg twice daily.   Hypertensive heart failure: On amlodipine 10 mg daily and on torsemide 20 mg daily as needed.  Hypothyroidism: on synthroid 50 mcg once daily in am. Recheck thyroid level.   Chronic respiratory failure with hypoxia resolved. Previously on 2 L daily. On breztri 1 puffs twice daily, Has ventolin HFA.  Uses hospital bed to elevate head and legs.   Full incontinence of urine.  Pull ups 4-5 per day. Large.  Chux 1 per night.      01/25/2023    9:34 AM 01/07/2023    1:38 PM 09/17/2022    8:59 AM 08/29/2022    2:30 PM 05/30/2022   10:19 AM  Depression screen PHQ 2/9  Decreased Interest 0 0 0 0 0  Down, Depressed, Hopeless 0 0 0 0 0  PHQ - 2 Score 0 0 0 0 0  Altered sleeping 0    0  Tired, decreased energy 0    0  Change in appetite 0    0  Feeling bad or failure about yourself  0    0  Trouble concentrating 0    0  Moving slowly or fidgety/restless 0    0  Suicidal thoughts 0    0  PHQ-9 Score 0    0  Difficult doing work/chores Not difficult at all    Not difficult at all        01/25/2023    9:33 AM  Fall Risk   Falls in the past year? 0  Number falls in past yr: 0  Injury with Fall? 0  Risk for fall due to : No Fall Risks  Follow up Falls evaluation completed;Falls prevention discussed    Patient Care Team: Blane Ohara, MD as PCP -  General (Internal Medicine) Loni Muse, MD as Consulting Physician (Internal Medicine) Bufford Buttner, MD as Consulting Physician (Nephrology) Marlowe Sax, RN as Case Manager (General Practice) Alden Hipp, RPH-CPP (Pharmacist)   Review of Systems  Constitutional:  Negative for chills, fatigue and fever.  HENT:  Negative for congestion, rhinorrhea and sore throat.   Respiratory:  Positive for cough. Negative for shortness of breath.   Cardiovascular:  Positive for leg swelling. Negative for chest pain.  Gastrointestinal:  Negative for abdominal pain, constipation, diarrhea, nausea and vomiting.  Genitourinary:  Negative for dysuria and urgency.       Leaks urine   Musculoskeletal:  Negative for back pain and myalgias.  Neurological:  Negative for dizziness, weakness, light-headedness and headaches.  Psychiatric/Behavioral:  Negative for dysphoric mood. The patient is not nervous/anxious.     Current Outpatient Medications on File Prior to Visit  Medication Sig Dispense Refill   amLODipine (NORVASC) 10 MG tablet TAKE 1 TABLET(10 MG) BY MOUTH DAILY 90 tablet 1  atorvastatin (LIPITOR) 40 MG tablet Take 1 tablet (40 mg total) by mouth daily. Per ccm 90 tablet 1   benzonatate (TESSALON PERLES) 100 MG capsule Take 1 capsule (100 mg total) by mouth 3 (three) times daily as needed for cough. 30 capsule 1   Budeson-Glycopyrrol-Formoterol (BREZTRI AEROSPHERE) 160-9-4.8 MCG/ACT AERO Inhale 2 puffs into the lungs in the morning and at bedtime. 10.7 g 4   Cholecalciferol (VITAMIN D) 125 MCG (5000 UT) CAPS Take 1 capsule by mouth daily.     cyanocobalamin (VITAMIN B12) 1000 MCG/ML injection Inject 1 mL (1,000 mcg total) into the muscle once a week. 4 mL 1   ferrous sulfate 325 (65 FE) MG tablet Take 1 tablet (325 mg total) by mouth daily. 90 tablet 0   levalbuterol (XOPENEX) 1.25 MG/3ML nebulizer solution Take 1.25 mg by nebulization 2 (two) times daily.     melatonin 1 MG TABS  tablet Take 1 tablet (1 mg total) by mouth at bedtime. 30 tablet 1   nitroGLYCERIN (NITROSTAT) 0.4 MG SL tablet Place 1 tablet (0.4 mg total) under the tongue every 5 (five) minutes as needed for chest pain. 30 tablet 3   pantoprazole (PROTONIX) 40 MG tablet TAKE 1 TABLET(40 MG) BY MOUTH DAILY 90 tablet 1   predniSONE (DELTASONE) 20 MG tablet Take 3 tablets (60 mg total) by mouth daily with breakfast for 3 days, THEN 2 tablets (40 mg total) daily with breakfast for 3 days, THEN 1 tablet (20 mg total) daily with breakfast for 3 days. 18 tablet 0   rOPINIRole (REQUIP) 2 MG tablet Take 1 tablet (2 mg total) by mouth 2 (two) times daily. 60 tablet 2   sodium polystyrene (KAYEXALATE) powder 15 gm dose four times a day x 3 days. 454 g 0   SYNTHROID 50 MCG tablet Take 1 tablet (50 mcg total) by mouth daily before breakfast. 30 tablet 1   torsemide (DEMADEX) 20 MG tablet Take 1 tablet (20 mg total) by mouth daily as needed (edema). 30 tablet 2   traMADol (ULTRAM) 50 MG tablet Take by mouth.     VENTOLIN HFA 108 (90 Base) MCG/ACT inhaler Inhale 1-2 puffs into the lungs every 6 (six) hours as needed for wheezing or shortness of breath. 18 g 2   No current facility-administered medications on file prior to visit.   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 82 yo   BACK SURGERY  2016   lower L 4 to L 5   bladder tack and uterus lift  10/10/2015   CHOLECYSTECTOMY     summer of 2022   CYSTOSCOPY WITH RETROGRADE PYELOGRAM, URETEROSCOPY AND  STENT PLACEMENT Right 08/04/2022   Procedure: CYSTOSCOPY WITH RIGHT URETERAL STENT EXCHANGE;  Surgeon: Despina Arias, MD;  Location: [facility] OR;  Service: Urology;  Laterality: Right;   IR RADIOLOGIST EVAL & MGMT  12/03/2018   IR RADIOLOGIST EVAL & MGMT  12/31/2018   IR REMOVAL TUN ACCESS W/ PORT W/O FL MOD SED  08/08/2022  PARATHYROIDECTOMY Left 07/02/2016   Procedure: LEFT INFERIOR PARATHYROIDECTOMY;  Surgeon: Darnell Level, MD;  Location: [facility] ORS;  Service: General;  Laterality: Left;   SHOULDER OPEN ROTATOR CUFF REPAIR  12/2019   TEE WITHOUT CARDIOVERSION N/A 08/10/2022   Procedure: TRANSESOPHAGEAL ECHOCARDIOGRAM (TEE);  Surgeon: Chrystie Nose, MD;  Location: [facility] ENDOSCOPY;  Service: Cardiovascular;  Laterality: N/A;   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    Family History  Problem Relation Age of Onset   Emphysema Mother    Hyperlipidemia Sister    Hypertension Sister    COPD Sister    Hyperlipidemia Sister    Hypertension Sister    Lymphoma Sister    Obesity Brother    Hyperlipidemia Brother    Hypertension Brother    Breast cancer Neg Hx    Social History   Socioeconomic History   Marital status: Married    Spouse name: Molly Maduro   Number of children: 2   Years of education: 11   Highest education level: 11th grade  Occupational History   Occupation: Retired  Tobacco Use   Smoking status: Former    Current packs/day: 0.00    Average packs/day: 1 pack/day for 50.0 years (50.0 ttl pk-yrs)    Types: Cigarettes    Start date: 06/12/1955    Quit date: 06/11/2005    Years since quitting: 17.6   Smokeless tobacco: Never  Vaping Use   Vaping status: Never Used  Substance and Sexual Activity   Alcohol use: No   Drug use: No   Sexual activity: Yes    Partners: Male  Other Topics Concern   Not on file  Social History Narrative   Not on file   Social Determinants of Health   Financial Resource Strain: Medium Risk (09/26/2022)   Overall Financial Resource Strain  (CARDIA)    Difficulty of Paying Living Expenses: Somewhat hard  Food Insecurity: No Food Insecurity (08/14/2022)   Hunger Vital Sign    Worried About Running Out of Food in the Last Year: Never true    Ran Out of Food in the Last Year: Never true  Transportation Needs: No Transportation Needs (08/14/2022)   PRAPARE - Administrator, Civil Service (Medical): No    Lack of Transportation (Non-Medical): No  Physical Activity: Inactive (12/04/2022)   Exercise Vital Sign    Days of Exercise per Week: 0 days    Minutes of Exercise per Session: 0 min  Stress: Stress Concern Present (12/04/2022)   [employer] of Occupational Health - Occupational Stress Questionnaire    Feeling of Stress : To some extent  Social Connections: Moderately Integrated (02/15/2022)   Social Connection and Isolation Panel [NHANES]    Frequency of Communication with Friends and Family: More than three times a week    Frequency of Social Gatherings with Friends and Family: More than three times a week    Attends Religious Services: More than 4 times per year    Active Member of [employer] or Organizations: No    Attends Engineer, structural: Never    Marital Status: Married    Objective:  BP (!) 144/64   Pulse 72   Temp (!) 95.7 F (35.4 C)   Resp 18   Ht 5\' 2"  (1.575 m)   Wt 142 lb 9.6 oz (64.7 kg)   BMI 26.08 kg/m      01/25/2023    9:35 AM 01/25/2023    9:28 AM 01/21/2023   10:02  AM  BP/Weight  Systolic BP 144 156 140  Diastolic BP 64 70 70  Wt. (Lbs)  142.6 138.6  BMI  26.08 kg/m2 25.35 kg/m2    Physical Exam Vitals reviewed.  Constitutional:      Appearance: Normal appearance. She is normal weight.  Neck:     Vascular: No carotid bruit.  Cardiovascular:     Rate and Rhythm: Normal rate and regular rhythm.     Heart sounds: Murmur heard.  Pulmonary:     Effort: Pulmonary effort is normal. No respiratory distress.     Breath sounds: Normal breath sounds.  Abdominal:      General: Abdomen is flat. Bowel sounds are normal.     Palpations: Abdomen is soft.     Tenderness: There is no abdominal tenderness.  Neurological:     Mental Status: She is alert and oriented to person, place, and time.  Psychiatric:        Mood and Affect: Mood normal.        Behavior: Behavior normal.     Diabetic Foot Exam - Simple   No data filed      Lab Results  Component Value Date   WBC 8.3 10/05/2022   HGB 9.2 (L) 10/05/2022   HCT 28.3 (L) 10/05/2022   PLT 337 10/05/2022   GLUCOSE 90 10/15/2022   CHOL 205 (H) 10/05/2022   TRIG 100 10/05/2022   HDL 68 10/05/2022   LDLCALC 119 (H) 10/05/2022   ALT 12 10/15/2022   AST 16 10/15/2022   NA 140 10/15/2022   K 5.2 10/15/2022   CL 107 (H) 10/15/2022   CREATININE 3.74 (H) 10/15/2022   BUN 50 (H) 10/15/2022   CO2 18 (L) 10/15/2022   TSH 15.600 (H) 09/17/2022   INR 1.1 08/04/2022   HGBA1C 5.6 04/04/2021      Assessment & Plan:    Hypertensive heart disease with chronic diastolic congestive heart failure (HCC) Assessment & Plan: Continue torsemide 20 mg twice daily.   Orders: -     CBC with Differential/Platelet -     Comprehensive metabolic panel  Gastroesophageal reflux disease, unspecified whether esophagitis present  Gastroesophageal reflux disease with esophagitis without hemorrhage  Chronic kidney disease, stage V (HCC) -     POCT URINALYSIS DIP (CLINITEK) -     Microalbumin / creatinine urine ratio  Mixed hyperlipidemia Assessment & Plan: Well controlled.  No changes to medicines. Continue lipitor 40 mg before bed.  Continue to work on eating a healthy diet and exercise.  Labs drawn today.    Orders: -     Lipid panel  Acquired hypothyroidism -     T4, free -     TSH  Other vitamin B12 deficiency anemia -     Vitamin B12 -     Methylmalonic acid, serum     No orders of the defined types were placed in this encounter.   Orders Placed This Encounter  Procedures   CBC with  Differential/Platelet   Comprehensive metabolic panel   Lipid panel   T4, free   TSH   Vitamin B12   Methylmalonic acid, serum   Microalbumin / creatinine urine ratio   POCT URINALYSIS DIP (CLINITEK)     Follow-up: No follow-ups on file.  Total time spent on today's visit was greater than 40 minutes, including both face-to-face time and nonface-to-face time personally spent on review of chart (labs and imaging), discussing labs and goals, discussing further work-up, treatment options, referrals  to specialist if needed, reviewing outside records of pertinent, answering patient's questions, and coordinating care.  I,Marla I Leal-Borjas,acting as a scribe for Blane Ohara, MD.,have documented all relevant documentation on the behalf of Blane Ohara, MD,as directed by  Blane Ohara, MD while in the presence of Blane Ohara, MD.   An After Visit Summary was printed and given to the patient.  Blane Ohara, MD [facility] [phone]

## 2023-01-24 NOTE — Assessment & Plan Note (Signed)
Continue torsemide 20 mg twice daily  °

## 2023-01-24 NOTE — Assessment & Plan Note (Signed)
Well controlled.  No changes to medicines.  Continue lipitor 40 mg before bed.  Continue to work on eating a healthy diet and exercise.  Labs drawn today.   

## 2023-01-25 ENCOUNTER — Encounter: Payer: Self-pay | Admitting: Family Medicine

## 2023-01-25 ENCOUNTER — Ambulatory Visit (INDEPENDENT_AMBULATORY_CARE_PROVIDER_SITE_OTHER): Payer: Medicare Other | Admitting: Family Medicine

## 2023-01-25 VITALS — BP 144/64 | HR 72 | Temp 95.7°F | Resp 18 | Ht 62.0 in | Wt 142.6 lb

## 2023-01-25 DIAGNOSIS — K219 Gastro-esophageal reflux disease without esophagitis: Secondary | ICD-10-CM

## 2023-01-25 DIAGNOSIS — D518 Other vitamin B12 deficiency anemias: Secondary | ICD-10-CM

## 2023-01-25 DIAGNOSIS — E039 Hypothyroidism, unspecified: Secondary | ICD-10-CM | POA: Diagnosis not present

## 2023-01-25 DIAGNOSIS — I110 Hypertensive heart disease with heart failure: Secondary | ICD-10-CM

## 2023-01-25 DIAGNOSIS — E782 Mixed hyperlipidemia: Secondary | ICD-10-CM | POA: Diagnosis not present

## 2023-01-25 DIAGNOSIS — I5032 Chronic diastolic (congestive) heart failure: Secondary | ICD-10-CM

## 2023-01-25 DIAGNOSIS — I132 Hypertensive heart and chronic kidney disease with heart failure and with stage 5 chronic kidney disease, or end stage renal disease: Secondary | ICD-10-CM | POA: Diagnosis not present

## 2023-01-25 DIAGNOSIS — Z23 Encounter for immunization: Secondary | ICD-10-CM

## 2023-01-25 DIAGNOSIS — K2100 Gastro-esophageal reflux disease with esophagitis, without bleeding: Secondary | ICD-10-CM

## 2023-01-25 DIAGNOSIS — N185 Chronic kidney disease, stage 5: Secondary | ICD-10-CM | POA: Diagnosis not present

## 2023-01-25 NOTE — Patient Instructions (Addendum)
Check blood pressure daily at home.   Please bring blood pressure cuff to her annual wellness visit in September 2024.

## 2023-01-26 LAB — MICROALBUMIN / CREATININE URINE RATIO
Creatinine, Urine: 49.1 mg/dL
Microalb/Creat Ratio: 1058 mg/g{creat} — ABNORMAL HIGH (ref 0–29)
Microalbumin, Urine: 519.5 ug/mL

## 2023-01-27 DIAGNOSIS — E039 Hypothyroidism, unspecified: Secondary | ICD-10-CM | POA: Insufficient documentation

## 2023-01-27 NOTE — Assessment & Plan Note (Signed)
Defer management to nephrology. Per their notes, patient is not interested in dialysis.

## 2023-01-27 NOTE — Assessment & Plan Note (Signed)
The current medical regimen is effective;  continue present plan and medications. °Continue pantoprazole 40 mg daily  °

## 2023-01-27 NOTE — Assessment & Plan Note (Signed)
Check labs 

## 2023-01-27 NOTE — Assessment & Plan Note (Signed)
Previously well controlled Continue Synthroid at current dose  Recheck TSH and adjust Synthroid as indicated   

## 2023-01-28 DIAGNOSIS — M6281 Muscle weakness (generalized): Secondary | ICD-10-CM | POA: Diagnosis not present

## 2023-01-28 DIAGNOSIS — M5416 Radiculopathy, lumbar region: Secondary | ICD-10-CM | POA: Diagnosis not present

## 2023-01-28 DIAGNOSIS — R293 Abnormal posture: Secondary | ICD-10-CM | POA: Diagnosis not present

## 2023-01-28 DIAGNOSIS — R2689 Other abnormalities of gait and mobility: Secondary | ICD-10-CM | POA: Diagnosis not present

## 2023-01-30 DIAGNOSIS — R293 Abnormal posture: Secondary | ICD-10-CM | POA: Diagnosis not present

## 2023-01-30 DIAGNOSIS — M5416 Radiculopathy, lumbar region: Secondary | ICD-10-CM | POA: Diagnosis not present

## 2023-01-30 DIAGNOSIS — M6281 Muscle weakness (generalized): Secondary | ICD-10-CM | POA: Diagnosis not present

## 2023-01-30 DIAGNOSIS — R2689 Other abnormalities of gait and mobility: Secondary | ICD-10-CM | POA: Diagnosis not present

## 2023-02-01 ENCOUNTER — Telehealth: Payer: Self-pay

## 2023-02-01 NOTE — Telephone Encounter (Signed)
Alexandra [name] was notified of lab results.  Kidney function improved, anemia little worse, wbc up, liver function good and cholesterol good.

## 2023-02-04 DIAGNOSIS — R2689 Other abnormalities of gait and mobility: Secondary | ICD-10-CM | POA: Diagnosis not present

## 2023-02-04 DIAGNOSIS — M5416 Radiculopathy, lumbar region: Secondary | ICD-10-CM | POA: Diagnosis not present

## 2023-02-04 DIAGNOSIS — R293 Abnormal posture: Secondary | ICD-10-CM | POA: Diagnosis not present

## 2023-02-04 DIAGNOSIS — M6281 Muscle weakness (generalized): Secondary | ICD-10-CM | POA: Diagnosis not present

## 2023-02-04 LAB — COMPREHENSIVE METABOLIC PANEL
ALT: 11 IU/L (ref 0–32)
AST: 14 IU/L (ref 0–40)
Albumin: 4 g/dL (ref 3.8–4.8)
Alkaline Phosphatase: 104 IU/L (ref 44–121)
BUN/Creatinine Ratio: 19 (ref 12–28)
BUN: 57 mg/dL — ABNORMAL HIGH (ref 8–27)
Bilirubin Total: 0.3 mg/dL (ref 0.0–1.2)
CO2: 19 mmol/L — ABNORMAL LOW (ref 20–29)
Calcium: 8.8 mg/dL (ref 8.7–10.3)
Chloride: 106 mmol/L (ref 96–106)
Creatinine, Ser: 3.02 mg/dL — ABNORMAL HIGH (ref 0.57–1.00)
Globulin, Total: 2.4 g/dL (ref 1.5–4.5)
Glucose: 81 mg/dL (ref 70–99)
Potassium: 5.3 mmol/L — ABNORMAL HIGH (ref 3.5–5.2)
Sodium: 141 mmol/L (ref 134–144)
Total Protein: 6.4 g/dL (ref 6.0–8.5)
eGFR: 15 mL/min/{1.73_m2} — ABNORMAL LOW (ref >59)

## 2023-02-04 LAB — CBC WITH DIFFERENTIAL/PLATELET
Basophils Absolute: 0 10*3/uL (ref 0.0–0.2)
Basos: 0 %
EOS (ABSOLUTE): 0 10*3/uL (ref 0.0–0.4)
Eos: 0 %
Hematocrit: 28 % — ABNORMAL LOW (ref 34.0–46.6)
Hemoglobin: 8.8 g/dL — ABNORMAL LOW (ref 11.1–15.9)
Immature Grans (Abs): 0.2 10*3/uL — ABNORMAL HIGH (ref 0.0–0.1)
Immature Granulocytes: 2 %
Lymphocytes Absolute: 2.5 10*3/uL (ref 0.7–3.1)
Lymphs: 22 %
MCH: 29 pg (ref 26.6–33.0)
MCHC: 31.4 g/dL — ABNORMAL LOW (ref 31.5–35.7)
MCV: 92 fL (ref 79–97)
Monocytes Absolute: 0.9 10*3/uL (ref 0.1–0.9)
Monocytes: 8 %
Neutrophils Absolute: 7.9 10*3/uL — ABNORMAL HIGH (ref 1.4–7.0)
Neutrophils: 68 %
Platelets: 400 10*3/uL (ref 150–450)
RBC: 3.03 x10E6/uL — ABNORMAL LOW (ref 3.77–5.28)
RDW: 14.9 % (ref 11.7–15.4)
WBC: 11.5 10*3/uL — ABNORMAL HIGH (ref 3.4–10.8)

## 2023-02-04 LAB — LIPID PANEL
Chol/HDL Ratio: 2.7 ratio (ref 0.0–4.4)
Cholesterol, Total: 161 mg/dL (ref 100–199)
HDL: 60 mg/dL (ref >39)
LDL Chol Calc (NIH): 85 mg/dL (ref 0–99)
Triglycerides: 86 mg/dL (ref 0–149)
VLDL Cholesterol Cal: 16 mg/dL (ref 5–40)

## 2023-02-04 LAB — TSH: TSH: 5.23 u[IU]/mL — ABNORMAL HIGH (ref 0.450–4.500)

## 2023-02-04 LAB — T4, FREE: Free T4: 1.02 ng/dL (ref 0.82–1.77)

## 2023-02-04 LAB — VITAMIN B12: Vitamin B-12: 583 pg/mL (ref 232–1245)

## 2023-02-04 LAB — METHYLMALONIC ACID, SERUM: Methylmalonic Acid: 533 nmol/L — ABNORMAL HIGH (ref 0–378)

## 2023-02-04 LAB — LITHOLINK CKD PROGRAM

## 2023-02-05 ENCOUNTER — Ambulatory Visit: Payer: Self-pay | Admitting: Licensed Clinical Social Worker

## 2023-02-05 DIAGNOSIS — C679 Malignant neoplasm of bladder, unspecified: Secondary | ICD-10-CM | POA: Diagnosis not present

## 2023-02-05 DIAGNOSIS — C649 Malignant neoplasm of unspecified kidney, except renal pelvis: Secondary | ICD-10-CM | POA: Diagnosis not present

## 2023-02-05 NOTE — Patient Outreach (Signed)
  Care Coordination   Follow Up Visit Note   02/05/2023 Name: Alexandra [name] MRN: [id number] DOB: 10-16-1940  Alexandra [name] is a 82 y.o. year old female who sees Cox, Fritzi Mandes, MD for primary care. I spoke with  Alexandra [name] by phone today.  What matters to the patients health and wellness today?  Patient is trying to manage her medical needs and   manage needs of her spouse    Goals Addressed             This Visit's Progress    patient trying to manage her medical needs and manage needs of her spouse       Interventions:  LCSW spoke via phone today with client about her needs and the needs of her spouse Discussed family support. She said she has some support from her daughter and son. They both work during the week Discussed medication procurement. Provided counseling support for client  Discussed ambulation of client. She uses a rollator walker to help her walk. She has to take periodic rest breaks when walking. Client said she is no longer using oxygen equipment.. She said she is breathing well. She did not mention being short of breath. Discussed health needs of her spouse. She spoke of ways she helps her spouse with ADLs and daily activities Discussed energy level of client. Client said she sometimes has reduced energy Reviewed edema issues. She said she wears socks and bedroom slippers. She plans to call nurse at PCP office to see if nurse is aware of any resources for her to use related to shoes.  She said because of swelling in her feet it is difficult for her to wear shoes. Encouraged client to call LCSW as needed for SW support at [phone]          SDOH assessments and interventions completed:  Yes  SDOH Interventions Today    Flowsheet Row Most Recent Value  SDOH Interventions   Physical Activity Interventions Other (Comments)  [walking challenges. mobility challenges. uses rollator walker to help her walk]  Stress Interventions Provide Counseling   [client has stress in managing the care needs of her spouse. She has stress in managing financial needs]        Care Coordination Interventions:  Yes, provided    Interventions Today    Flowsheet Row Most Recent Value  Chronic Disease   Chronic disease during today's visit Other  [spoke with client about client needs]  General Interventions   General Interventions Discussed/Reviewed General Interventions Discussed, Community Resources  Exercise Interventions   Exercise Discussed/Reviewed Physical Activity  [client uses rollator walker to help her walk]  Physical Activity Discussed/Reviewed Physical Activity Discussed  Education Interventions   Education Provided Provided Education  Provided Verbal Education On Community Resources  Mental Health Interventions   Mental Health Discussed/Reviewed Coping Strategies  [no mood issues mentioned]  Nutrition Interventions   Nutrition Discussed/Reviewed Nutrition Discussed  Pharmacy Interventions   Pharmacy Dicussed/Reviewed Pharmacy Topics Discussed  Safety Interventions   Safety Discussed/Reviewed Fall Risk       Follow up plan:  LCSW to call client on 04/02/23 at 10:00 AM  Encounter Outcome:  Pt. Visit Completed   Kelton Pillar.Mayfield Schoene MSW, LCSW Licensed Visual merchandiser [facility] Care Management [phone]

## 2023-02-05 NOTE — Patient Instructions (Signed)
Visit Information  Thank you for taking time to visit with me today. Please don't hesitate to contact me if I can be of assistance to you.   Following are the goals we discussed today:   Goals Addressed             This Visit's Progress    patient trying to manage her medical needs and manage needs of her spouse       Interventions:  LCSW spoke via phone today with client about her needs and the needs of her spouse Discussed family support. She said she has some support from her daughter and son. They both work during the week Discussed medication procurement. Provided counseling support for client  Discussed ambulation of client. She uses a rollator walker to help her walk. She has to take periodic rest breaks when walking. Client said she is no longer using oxygen equipment.. She said she is breathing well. She did not mention being short of breath. Discussed health needs of her spouse. She spoke of ways she helps her spouse with ADLs and daily activities Discussed energy level of client. Client said she sometimes has reduced energy Reviewed edema issues. She said she wears socks and bedroom slippers. She plans to call nurse at PCP office to see if nurse is aware of any resources for her to use related to shoes.  She said because of swelling in her feet it is difficult for her to wear shoes. Encouraged client to call LCSW as needed for SW support at [phone]          Our next appointment is by telephone on 04/02/23 at 10:00 AM   Please call the care guide team at [phone] if you need to cancel or reschedule your appointment.   If you are experiencing a Mental Health or Behavioral Health Crisis or need someone to talk to, please go to [facility] [address], [place] [phone])   The patient verbalized understanding of instructions, educational materials, and care plan provided today and DECLINED offer to receive copy of  patient instructions, educational materials, and care plan.   The patient has been provided with contact information for the care management team and has been advised to call with any health related questions or concerns.   Kelton Pillar.Tausha Milhoan MSW, LCSW Licensed Visual merchandiser [facility] Care Management [phone]

## 2023-02-06 DIAGNOSIS — M6281 Muscle weakness (generalized): Secondary | ICD-10-CM | POA: Diagnosis not present

## 2023-02-06 DIAGNOSIS — R293 Abnormal posture: Secondary | ICD-10-CM | POA: Diagnosis not present

## 2023-02-06 DIAGNOSIS — M5416 Radiculopathy, lumbar region: Secondary | ICD-10-CM | POA: Diagnosis not present

## 2023-02-06 DIAGNOSIS — R2689 Other abnormalities of gait and mobility: Secondary | ICD-10-CM | POA: Diagnosis not present

## 2023-02-13 ENCOUNTER — Other Ambulatory Visit: Payer: Self-pay

## 2023-02-13 DIAGNOSIS — M6281 Muscle weakness (generalized): Secondary | ICD-10-CM | POA: Diagnosis not present

## 2023-02-13 DIAGNOSIS — M5416 Radiculopathy, lumbar region: Secondary | ICD-10-CM | POA: Diagnosis not present

## 2023-02-13 DIAGNOSIS — R293 Abnormal posture: Secondary | ICD-10-CM | POA: Diagnosis not present

## 2023-02-13 DIAGNOSIS — R2689 Other abnormalities of gait and mobility: Secondary | ICD-10-CM | POA: Diagnosis not present

## 2023-02-13 MED ORDER — CYANOCOBALAMIN 1000 MCG/ML IJ SOLN: 1000.0000 ug | INTRAMUSCULAR | 1 refills | Status: DC

## 2023-02-13 MED ORDER — SYNTHROID 75 MCG PO TABS: 75.0000 ug | ORAL_TABLET | Freq: Every day | ORAL | 0 refills | Status: DC

## 2023-02-15 DIAGNOSIS — R293 Abnormal posture: Secondary | ICD-10-CM | POA: Diagnosis not present

## 2023-02-15 DIAGNOSIS — M6281 Muscle weakness (generalized): Secondary | ICD-10-CM | POA: Diagnosis not present

## 2023-02-15 DIAGNOSIS — R2689 Other abnormalities of gait and mobility: Secondary | ICD-10-CM | POA: Diagnosis not present

## 2023-02-15 DIAGNOSIS — M5416 Radiculopathy, lumbar region: Secondary | ICD-10-CM | POA: Diagnosis not present

## 2023-02-19 NOTE — Telephone Encounter (Signed)
Rec's pt pages back and faxed pcp pages to office 02/05/23.  Rec'd letter from [employer] that pcp pages were submitted but no pt pages.   Faxing pt pages to company.

## 2023-02-20 DIAGNOSIS — M6281 Muscle weakness (generalized): Secondary | ICD-10-CM | POA: Diagnosis not present

## 2023-02-20 DIAGNOSIS — R2689 Other abnormalities of gait and mobility: Secondary | ICD-10-CM | POA: Diagnosis not present

## 2023-02-20 DIAGNOSIS — R293 Abnormal posture: Secondary | ICD-10-CM | POA: Diagnosis not present

## 2023-02-20 DIAGNOSIS — M5416 Radiculopathy, lumbar region: Secondary | ICD-10-CM | POA: Diagnosis not present

## 2023-02-22 DIAGNOSIS — M5416 Radiculopathy, lumbar region: Secondary | ICD-10-CM | POA: Diagnosis not present

## 2023-02-22 DIAGNOSIS — M6281 Muscle weakness (generalized): Secondary | ICD-10-CM | POA: Diagnosis not present

## 2023-02-22 DIAGNOSIS — R293 Abnormal posture: Secondary | ICD-10-CM | POA: Diagnosis not present

## 2023-02-22 DIAGNOSIS — R2689 Other abnormalities of gait and mobility: Secondary | ICD-10-CM | POA: Diagnosis not present

## 2023-02-25 ENCOUNTER — Ambulatory Visit (INDEPENDENT_AMBULATORY_CARE_PROVIDER_SITE_OTHER): Payer: Medicare Other

## 2023-02-25 DIAGNOSIS — E538 Deficiency of other specified B group vitamins: Secondary | ICD-10-CM | POA: Diagnosis not present

## 2023-02-25 DIAGNOSIS — Z23 Encounter for immunization: Secondary | ICD-10-CM

## 2023-02-25 MED ORDER — CYANOCOBALAMIN 1000 MCG/ML IJ SOLN
1000.0000 ug | INTRAMUSCULAR | Status: DC
Start: 2023-02-25 — End: 2023-05-31
  Administered 2023-02-25 – 2023-04-08 (×4): 1000 ug via INTRAMUSCULAR

## 2023-02-25 NOTE — Progress Notes (Signed)
Patient: Alexandra [name]  DOB: [birth year]  MRN: [id number]    Visit Date: 02/25/2023    [name] presents today for her B12 injection and flu shot.  Patient tolerated the injections well and has no questions.    Administrations This Visit     cyanocobalamin (VITAMIN B12) injection 1,000 mcg     Admin Date 02/25/2023 Action Given Dose 1,000 mcg Route Intramuscular Documented By Jacklynn Bue, LPN             Jacklynn Bue, LPN  40/98/11 9:14 AM

## 2023-02-25 NOTE — Telephone Encounter (Signed)
Rec'd shipment faxes from [employer]  Pt approved. Will call company to get enrollment end date.

## 2023-02-26 ENCOUNTER — Ambulatory Visit (INDEPENDENT_AMBULATORY_CARE_PROVIDER_SITE_OTHER): Payer: Medicare Other

## 2023-02-26 VITALS — BP 120/78

## 2023-02-26 DIAGNOSIS — Z0000 Encounter for general adult medical examination without abnormal findings: Secondary | ICD-10-CM

## 2023-02-26 NOTE — Patient Instructions (Signed)
Ms. Alexandra [name] , Thank you for taking time to come for your Medicare Wellness Visit. I appreciate your ongoing commitment to your health goals. Please review the following plan we discussed and let me know if I can assist you in the future.      This is a list of the screening recommended for you and due dates:  Health Maintenance  Topic Date Due   Zoster (Shingles) Vaccine (2 of 2) 10/12/2021   Medicare Annual Wellness Visit  02/26/2024   DTaP/Tdap/Td vaccine (2 - Td or Tdap) 08/18/2031   Pneumonia Vaccine  Completed   Flu Shot  Completed   DEXA scan (bone density measurement)  Completed   HPV Vaccine  Aged Out   Colon Cancer Screening  Discontinued   COVID-19 Vaccine  Discontinued   I sent a referral to [employer] for incontinence supplies - they will be contacting you.  Preventive Care 80 Years and Older, Female Preventive care refers to lifestyle choices and visits with your health care provider that can promote health and wellness. What does preventive care include? A yearly physical exam. This is also called an annual well check. Dental exams once or twice a year. Routine eye exams. Ask your health care provider how often you should have your eyes checked. Personal lifestyle choices, including: Daily care of your teeth and gums. Regular physical activity. Eating a healthy diet. Avoiding tobacco and drug use. Limiting alcohol use. Practicing safe sex. Taking low-dose aspirin every day. Taking vitamin and mineral supplements as recommended by your health care provider. What happens during an annual well check? The services and screenings done by your health care provider during your annual well check will depend on your age, overall health, lifestyle risk factors, and family history of disease. Counseling  Your health care provider may ask you questions about your: Alcohol use. Tobacco use. Drug use. Emotional well-being. Home and relationship well-being. Sexual  activity. Eating habits. History of falls. Memory and ability to understand (cognition). Work and work Astronomer. Reproductive health. Screening  You may have the following tests or measurements: Height, weight, and BMI. Blood pressure. Lipid and cholesterol levels. These may be checked every 5 years, or more frequently if you are over 59 years old. Skin check. Lung cancer screening. You may have this screening every year starting at age 83 if you have a 30-pack-year history of smoking and currently smoke or have quit within the past 15 years. Fecal occult blood test (FOBT) of the stool. You may have this test every year starting at age 83. Flexible sigmoidoscopy or colonoscopy. You may have a sigmoidoscopy every 5 years or a colonoscopy every 10 years starting at age 4. Hepatitis C blood test. Hepatitis B blood test. Sexually transmitted disease (STD) testing. Diabetes screening. This is done by checking your blood sugar (glucose) after you have not eaten for a while (fasting). You may have this done every 1-3 years. Bone density scan. This is done to screen for osteoporosis. You may have this done starting at age 60. Mammogram. This may be done every 1-2 years. Talk to your health care provider about how often you should have regular mammograms. Talk with your health care provider about your test results, treatment options, and if necessary, the need for more tests. Vaccines  Your health care provider may recommend certain vaccines, such as: Influenza vaccine. This is recommended every year. Tetanus, diphtheria, and acellular pertussis (Tdap, Td) vaccine. You may need a Td booster every 10 years. Zoster  vaccine. You may need this after age 81. Pneumococcal 13-valent conjugate (PCV13) vaccine. One dose is recommended after age 71. Pneumococcal polysaccharide (PPSV23) vaccine. One dose is recommended after age 71. Talk to your health care provider about which screenings and vaccines  you need and how often you need them. This information is not intended to replace advice given to you by your health care provider. Make sure you discuss any questions you have with your health care provider. Document Released: 06/24/2015 Document Revised: 02/15/2016 Document Reviewed: 03/29/2015 Elsevier Interactive Patient Education  2017 [employer].  Fall Prevention in the Home Falls can cause injuries. They can happen to people of all ages. There are many things you can do to make your home safe and to help prevent falls. What can I do on the outside of my home? Regularly fix the edges of walkways and driveways and fix any cracks. Remove anything that might make you trip as you walk through a door, such as a raised step or threshold. Trim any bushes or trees on the path to your home. Use bright outdoor lighting. Clear any walking paths of anything that might make someone trip, such as rocks or tools. Regularly check to see if handrails are loose or broken. Make sure that both sides of any steps have handrails. Any raised decks and porches should have guardrails on the edges. Have any leaves, snow, or ice cleared regularly. Use sand or salt on walking paths during winter. Clean up any spills in your garage right away. This includes oil or grease spills. What can I do in the bathroom? Use night lights. Install grab bars by the toilet and in the tub and shower. Do not use towel bars as grab bars. Use non-skid mats or decals in the tub or shower. If you need to sit down in the shower, use a plastic, non-slip stool. Keep the floor dry. Clean up any water that spills on the floor as soon as it happens. Remove soap buildup in the tub or shower regularly. Attach bath mats securely with double-sided non-slip rug tape. Do not have throw rugs and other things on the floor that can make you trip. What can I do in the bedroom? Use night lights. Make sure that you have a light by your bed that  is easy to reach. Do not use any sheets or blankets that are too big for your bed. They should not hang down onto the floor. Have a firm chair that has side arms. You can use this for support while you get dressed. Do not have throw rugs and other things on the floor that can make you trip. What can I do in the kitchen? Clean up any spills right away. Avoid walking on wet floors. Keep items that you use a lot in easy-to-reach places. If you need to reach something above you, use a strong step stool that has a grab bar. Keep electrical cords out of the way. Do not use floor polish or wax that makes floors slippery. If you must use wax, use non-skid floor wax. Do not have throw rugs and other things on the floor that can make you trip. What can I do with my stairs? Do not leave any items on the stairs. Make sure that there are handrails on both sides of the stairs and use them. Fix handrails that are broken or loose. Make sure that handrails are as long as the stairways. Check any carpeting to make sure that it  is firmly attached to the stairs. Fix any carpet that is loose or worn. Avoid having throw rugs at the top or bottom of the stairs. If you do have throw rugs, attach them to the floor with carpet tape. Make sure that you have a light switch at the top of the stairs and the bottom of the stairs. If you do not have them, ask someone to add them for you. What else can I do to help prevent falls? Wear shoes that: Do not have high heels. Have rubber bottoms. Are comfortable and fit you well. Are closed at the toe. Do not wear sandals. If you use a stepladder: Make sure that it is fully opened. Do not climb a closed stepladder. Make sure that both sides of the stepladder are locked into place. Ask someone to hold it for you, if possible. Clearly mark and make sure that you can see: Any grab bars or handrails. First and last steps. Where the edge of each step is. Use tools that help you  move around (mobility aids) if they are needed. These include: Canes. Walkers. Scooters. Crutches. Turn on the lights when you go into a dark area. Replace any light bulbs as soon as they burn out. Set up your furniture so you have a clear path. Avoid moving your furniture around. If any of your floors are uneven, fix them. If there are any pets around you, be aware of where they are. Review your medicines with your doctor. Some medicines can make you feel dizzy. This can increase your chance of falling. Ask your doctor what other things that you can do to help prevent falls. This information is not intended to replace advice given to you by your health care provider. Make sure you discuss any questions you have with your health care provider. Document Released: 03/24/2009 Document Revised: 11/03/2015 Document Reviewed: 07/02/2014 Elsevier Interactive Patient Education  2017 [employer].

## 2023-02-26 NOTE — Telephone Encounter (Signed)
Received notification from [employer] regarding approval for BREZTRI & LOKELMA. Patient assistance approved from 02/19/23 to 06/10/24.  Medication will ship to patients home  Pt ID: [id number]  Company phone: [phone]

## 2023-02-26 NOTE — Progress Notes (Signed)
Subjective:   Alexandra [name] is a 82 y.o. female who presents for Medicare Annual (Subsequent) preventive examination.  This wellness visit is conducted by a nurse.  The patient's medications were reviewed and reconciled since the patient's last visit.  History details were provided by the patient.  The history appears to be reliable.    Medical History: Patient history and Family history was reviewed  Medications, Allergies, and preventative health maintenance was reviewed and updated.  Visit Complete: Virtual  I connected with  [name] on 02/26/23 by a audio enabled telemedicine application and verified that I am speaking with the correct person using two identifiers.  Patient Location: Home  Provider Location: Office/Clinic  I discussed the limitations of evaluation and management by telemedicine. The patient expressed understanding and agreed to proceed.   Cardiac Risk Factors include: advanced age (>52men, >42 women);dyslipidemia;hypertension;sedentary lifestyle;obesity (BMI >30kg/m2)     Objective:    Today's Vitals   02/26/23 0901  BP: 120/78  PainSc: 0-No pain  BP was home reading from approved auto cuff.  No other vitals were available due to lack of equipment at home. There is no height or weight on file to calculate BMI.     02/26/2023    9:22 AM 08/06/2022    2:48 PM 08/04/2022    6:32 PM 08/03/2022    4:11 PM 06/12/2022   10:24 AM 06/08/2022    9:19 AM 06/05/2022   10:01 AM  Advanced Directives  Does Patient Have a Medical Advance Directive? Yes No  No No No No  Type of Advance Directive Out of facility DNR (pink MOST or yellow form)        Does patient want to make changes to medical advance directive? No - Patient declined        Would patient like information on creating a medical advance directive?  Yes (Inpatient - patient defers creating a medical advance directive at this time - Information given) Yes (Inpatient - patient requests chaplain consult  to create a medical advance directive)  No - Patient declined No - Patient declined No - Patient declined    Current Medications (verified) Outpatient Encounter Medications as of 02/26/2023  Medication Sig   amLODipine (NORVASC) 10 MG tablet TAKE 1 TABLET(10 MG) BY MOUTH DAILY   atorvastatin (LIPITOR) 40 MG tablet Take 1 tablet (40 mg total) by mouth daily. Per ccm   benzonatate (TESSALON PERLES) 100 MG capsule Take 1 capsule (100 mg total) by mouth 3 (three) times daily as needed for cough.   Budeson-Glycopyrrol-Formoterol (BREZTRI AEROSPHERE) 160-9-4.8 MCG/ACT AERO Inhale 2 puffs into the lungs in the morning and at bedtime.   Cholecalciferol (VITAMIN D) 125 MCG (5000 UT) CAPS Take 1 capsule by mouth daily.   cyanocobalamin (VITAMIN B12) 1000 MCG/ML injection Inject 1 mL (1,000 mcg total) into the muscle every 14 (fourteen) days.   levalbuterol (XOPENEX) 1.25 MG/3ML nebulizer solution Take 1.25 mg by nebulization 2 (two) times daily.   melatonin 1 MG TABS tablet Take 1 tablet (1 mg total) by mouth at bedtime.   nitroGLYCERIN (NITROSTAT) 0.4 MG SL tablet Place 1 tablet (0.4 mg total) under the tongue every 5 (five) minutes as needed for chest pain.   pantoprazole (PROTONIX) 40 MG tablet TAKE 1 TABLET(40 MG) BY MOUTH DAILY   rOPINIRole (REQUIP) 2 MG tablet Take 1 tablet (2 mg total) by mouth 2 (two) times daily.   sodium polystyrene (KAYEXALATE) powder 15 gm dose four times a day  x 3 days.   SYNTHROID 75 MCG tablet Take 1 tablet (75 mcg total) by mouth daily before breakfast.   torsemide (DEMADEX) 20 MG tablet Take 1 tablet (20 mg total) by mouth daily as needed (edema).   traMADol (ULTRAM) 50 MG tablet Take by mouth.   VENTOLIN HFA 108 (90 Base) MCG/ACT inhaler Inhale 1-2 puffs into the lungs every 6 (six) hours as needed for wheezing or shortness of breath.   ferrous sulfate 325 (65 FE) MG tablet Take 1 tablet (325 mg total) by mouth daily.   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 82 yo   BACK SURGERY  2016   lower L 4 to L 5   bladder tack and uterus lift  10/10/2015   CHOLECYSTECTOMY     summer of 2022   CYSTOSCOPY WITH RETROGRADE PYELOGRAM, URETEROSCOPY AND STENT PLACEMENT Right 08/04/2022   Procedure: CYSTOSCOPY WITH RIGHT URETERAL STENT EXCHANGE;  Surgeon: Despina Arias, MD;  Location: [facility] OR;  Service: Urology;  Laterality: Right;   IR RADIOLOGIST EVAL & MGMT  12/03/2018   IR RADIOLOGIST EVAL & MGMT  12/31/2018   IR REMOVAL TUN ACCESS W/ PORT W/O FL MOD SED  08/08/2022   PARATHYROIDECTOMY Left 07/02/2016   Procedure: LEFT INFERIOR PARATHYROIDECTOMY;  Surgeon: Darnell Level, MD;  Location: [facility] ORS;  Service: General;  Laterality: Left;   SHOULDER OPEN ROTATOR CUFF REPAIR  12/2019   TEE WITHOUT CARDIOVERSION N/A 08/10/2022   Procedure: TRANSESOPHAGEAL ECHOCARDIOGRAM (TEE);  Surgeon: Chrystie Nose, MD;  Location: [facility] ENDOSCOPY;  Service: Cardiovascular;  Laterality: N/A;   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   Family History  Problem Relation Age of Onset   Emphysema Mother    Hyperlipidemia Sister    Hypertension Sister    COPD Sister    Hyperlipidemia Sister    Hypertension Sister    Lymphoma Sister    Obesity Brother     Hyperlipidemia Brother    Hypertension Brother    Breast cancer Neg Hx    Social History   Socioeconomic History   Marital status: Married    Spouse name: Molly Maduro   Number of children: 2   Years of education: 11   Highest education level: 11th grade  Occupational History   Occupation: Retired  Tobacco Use   Smoking status: Former    Current packs/day: 0.00    Average packs/day: 1 pack/day for 50.0 years (50.0 ttl pk-yrs)    Types: Cigarettes    Start date: 06/12/1955    Quit date: 06/11/2005    Years since quitting: 17.7   Smokeless tobacco: Never  Vaping Use   Vaping status: Never Used  Substance and Sexual Activity   Alcohol use: No   Drug use: No   Sexual activity: Yes  Partners: Male  Other Topics Concern   Not on file  Social History Narrative   Not on file   Social Determinants of Health   Financial Resource Strain: Medium Risk (02/26/2023)   Overall Financial Resource Strain (CARDIA)    Difficulty of Paying Living Expenses: Somewhat hard  Food Insecurity: No Food Insecurity (02/26/2023)   Hunger Vital Sign    Worried About Running Out of Food in the Last Year: Never true    Ran Out of Food in the Last Year: Never true  Transportation Needs: No Transportation Needs (02/26/2023)   PRAPARE - Administrator, Civil Service (Medical): No    Lack of Transportation (Non-Medical): No  Physical Activity: Sufficiently Active (02/26/2023)   Exercise Vital Sign    Days of Exercise per Week: 3 days    Minutes of Exercise per Session: 60 min  Recent Concern: Physical Activity - Inactive (02/05/2023)   Exercise Vital Sign    Days of Exercise per Week: 0 days    Minutes of Exercise per Session: 0 min  Stress: No Stress Concern Present (02/26/2023)   [employer] of Occupational Health - Occupational Stress Questionnaire    Feeling of Stress : Only a little  Recent Concern: Stress - Stress Concern Present (02/05/2023)   [employer] of Occupational Health -  Occupational Stress Questionnaire    Feeling of Stress : To some extent  Social Connections: Moderately Integrated (02/26/2023)   Social Connection and Isolation Panel [NHANES]    Frequency of Communication with Friends and Family: More than three times a week    Frequency of Social Gatherings with Friends and Family: More than three times a week    Attends Religious Services: More than 4 times per year    Active Member of [employer] or Organizations: No    Attends Engineer, structural: Never    Marital Status: Married    Tobacco Counseling Counseling given: Not Answered   Clinical Intake:  Pre-visit preparation completed: Yes Pain : No/denies pain Pain Score: 0-No pain   BMI - recorded: 26.08 Nutritional Status: BMI 25 -29 Overweight Nutritional Risks: None Diabetes: No How often do you need to have someone help you when you read instructions, pamphlets, or other written materials from your doctor or pharmacy?: 1 - Never What is the last grade level you completed in school?: 11 Interpreter Needed?: No    Activities of Daily Living    02/26/2023    9:05 AM 08/04/2022    6:26 PM  In your present state of health, do you have any difficulty performing the following activities:  Hearing? 0 0  Vision? 0 0  Difficulty concentrating or making decisions? 0 0  Walking or climbing stairs? 1 1  Dressing or bathing? 0 1  Doing errands, shopping? 0 1  Preparing Food and eating ? N   Using the Toilet? N   In the past six months, have you accidently leaked urine? Y   Do you have problems with loss of bowel control? N   Managing your Medications? N   Managing your Finances? N   Housekeeping or managing your Housekeeping? Y     Patient Care Team: Blane Ohara, MD as PCP - General (Internal Medicine) Loni Muse, MD as Consulting Physician (Internal Medicine) Bufford Buttner, MD as Consulting Physician (Nephrology) Marlowe Sax, RN as Case Manager (General  Practice) Alden Hipp, RPH-CPP (Pharmacist) Debroah Baller, MD as Consulting Physician (Urology) Baldo Daub, MD as  Consulting Physician (Cardiology)  Indicate any recent Medical Services you may have received from other than [facility] providers in the past year (date may be approximate).     Assessment:   This is a routine wellness examination for [place].  Hearing/Vision screen No results found.   Goals Addressed   None    Depression Screen    02/26/2023    9:17 AM 01/25/2023    9:34 AM 01/07/2023    1:38 PM 09/17/2022    8:59 AM 08/29/2022    2:30 PM 05/30/2022   10:19 AM 04/10/2022    7:55 AM  PHQ 2/9 Scores  PHQ - 2 Score 0 0 0 0 0 0 0  PHQ- 9 Score 0 0    0 0    Fall Risk    02/26/2023    9:04 AM 01/25/2023    9:33 AM 01/07/2023    1:37 PM 09/26/2022   12:28 PM 09/17/2022    8:58 AM  Fall Risk   Falls in the past year? 0 0 0  1  Number falls in past yr: 0 0 0  0  Injury with Fall? 0 0 0  1  Risk for fall due to : No Fall Risks No Fall Risks Impaired balance/gait Impaired balance/gait History of fall(s);Impaired mobility  Follow up Falls evaluation completed;Education provided Falls evaluation completed;Falls prevention discussed Falls evaluation completed;Falls prevention discussed  Falls evaluation completed;Falls prevention discussed    MEDICARE RISK AT HOME: Medicare Risk at Home Any stairs in or around the home?: No If so, are there any without handrails?: No Home free of loose throw rugs in walkways, pet beds, electrical cords, etc?: Yes Adequate lighting in your home to reduce risk of falls?: Yes Life alert?: No Use of a cane, walker or w/c?: Yes Grab bars in the bathroom?: Yes Shower chair or bench in shower?: Yes Elevated toilet seat or a handicapped toilet?: Yes  TIMED UP AND GO:  Was the test performed?  No    Cognitive Function:        02/26/2023    9:24 AM 02/15/2022    1:33 PM  6CIT Screen  What Year? 0 points 0 points  What month? 0  points 0 points  What time? 0 points 0 points  Count back from 20 0 points 0 points  Months in reverse 2 points 0 points  Repeat phrase 4 points 8 points  Total Score 6 points 8 points    Immunizations Immunization History  Administered Date(s) Administered   Fluad Quad(high Dose 65+) 03/25/2019, 02/17/2020, 04/04/2021, 02/27/2022   Fluad Trivalent(High Dose 65+) 02/25/2023   Influenza, High Dose Seasonal PF 03/27/2017, 04/01/2018   Moderna SARS-COV2 Booster Vaccination 04/12/2020, 12/01/2020   Moderna Sars-Covid-2 Vaccination 06/26/2019, 07/24/2019   PNEUMOCOCCAL CONJUGATE-20 01/25/2023   Pneumococcal Conjugate PCV 7 10/15/2013   Pneumococcal Conjugate-13 10/15/2013, 03/27/2017   Pneumococcal-Unspecified 10/15/2013   Tdap 08/17/2021   Zoster Recombinant(Shingrix) 08/17/2021   Zoster, Live 11/23/2010    TDAP status: Up to date  Flu Vaccine status: Up to date  Pneumococcal vaccine status: Up to date  Covid-19 vaccine status: Declined, Education has been provided regarding the importance of this vaccine but patient still declined. Advised may receive this vaccine at local pharmacy or Health Dept.or vaccine clinic. Aware to provide a copy of the vaccination record if obtained from local pharmacy or Health Dept. Verbalized acceptance and understanding.  Qualifies for Shingles Vaccine? Yes   Zostavax completed Yes   Shingrix Completed?: Yes  Screening Tests Health Maintenance  Topic Date Due   Zoster Vaccines- Shingrix (2 of 2) 10/12/2021   Medicare Annual Wellness (AWV)  02/16/2023   DTaP/Tdap/Td (2 - Td or Tdap) 08/18/2031   Pneumonia Vaccine 73+ Years old  Completed   INFLUENZA VACCINE  Completed   DEXA SCAN  Completed   HPV VACCINES  Aged Out   Colonoscopy  Discontinued   COVID-19 Vaccine  Discontinued    Health Maintenance  Health Maintenance Due  Topic Date Due   Zoster Vaccines- Shingrix (2 of 2) 10/12/2021   Medicare Annual Wellness (AWV)  02/16/2023     Colorectal cancer screening: No longer required.   Mammogram status: No longer required due to age.  Bone Density status: Completed 08/2021. Results reflect: Bone density results: OSTEOPOROSIS. Repeat every 2 years.  Lung Cancer Screening: (Low Dose CT Chest recommended if Age 80-80 years, 20 pack-year currently smoking OR have quit w/in 15years.) does not qualify.   Additional Screening:  Vision Screening: Recommended annual ophthalmology exams for early detection of glaucoma and other disorders of the eye. Is the patient up to date with their annual eye exam?  Yes  Who is the provider or what is the name of the office in which the patient attends annual eye exams? [facility]  Dental Screening: Recommended annual dental exams for proper oral hygiene   Community Resource Referral / Chronic Care Management: CRR required this visit?  No   CCM required this visit?  No     Plan:    1- Patient has trouble affording incontinence supplies and is requesting help getting it thru her insurance.  She goes thru approximately 5 pull-ups daily. Patient is also requesting washable under pads that she can put on her bed and chair.  I sent a request to [employer] on her behalf for help with incontinence supplies.  Dx: C64.9, N32.89, R32  2- Osteoporosis: Deferred to provider - Prolia was held due to worsening kidney function - should she be on Fosamax or has it improved enough to restart Prolia?   I have personally reviewed and noted the following in the patient's chart:   Medical and social history Use of alcohol, tobacco or illicit drugs  Current medications and supplements including opioid prescriptions.  Functional ability and status Nutritional status Physical activity Advanced directives List of other physicians Hospitalizations, surgeries, and ER visits in previous 12 months Vitals Screenings to include cognitive, depression, and falls Referrals and appointments  In  addition, I have reviewed and discussed with patient certain preventive protocols, quality metrics, and best practice recommendations. A written personalized care plan for preventive services as well as general preventive health recommendations were provided to patient.     Jacklynn Bue, LPN   1/61/0960   After Visit Summary: (Mail) Due to this being a telephonic visit, the after visit summary with patients personalized plan was offered to patient via mail   Nurse Notes: referral sent to [facility] for incontinence supplies

## 2023-02-27 DIAGNOSIS — R2689 Other abnormalities of gait and mobility: Secondary | ICD-10-CM | POA: Diagnosis not present

## 2023-02-27 DIAGNOSIS — R293 Abnormal posture: Secondary | ICD-10-CM | POA: Diagnosis not present

## 2023-02-27 DIAGNOSIS — M5416 Radiculopathy, lumbar region: Secondary | ICD-10-CM | POA: Diagnosis not present

## 2023-02-27 DIAGNOSIS — M6281 Muscle weakness (generalized): Secondary | ICD-10-CM | POA: Diagnosis not present

## 2023-03-01 DIAGNOSIS — M6281 Muscle weakness (generalized): Secondary | ICD-10-CM | POA: Diagnosis not present

## 2023-03-01 DIAGNOSIS — M5416 Radiculopathy, lumbar region: Secondary | ICD-10-CM | POA: Diagnosis not present

## 2023-03-01 DIAGNOSIS — R293 Abnormal posture: Secondary | ICD-10-CM | POA: Diagnosis not present

## 2023-03-01 DIAGNOSIS — R2689 Other abnormalities of gait and mobility: Secondary | ICD-10-CM | POA: Diagnosis not present

## 2023-03-04 ENCOUNTER — Other Ambulatory Visit: Payer: Medicare Other | Admitting: Pharmacist

## 2023-03-04 NOTE — Progress Notes (Signed)
03/04/2023 Name: Alexandra [name] MRN: [id number] DOB: 16-Nov-1940  Care Coordination Call  Outreached patient in response to inbasket message confirming delivery of patient assistance medications - breztri and lokelma.   Patient confirms she has received medication supply. Updated medication list. Offered support, patient has no questions or medication needs at this time.  Follow-up: no follow up scheduled at this time, will keep patient on rx med assistance list for 2025 patient assistance renewal.   Lynnda Shields, PharmD, BCPS Clinical Pharmacist [facility]

## 2023-03-05 DIAGNOSIS — M47817 Spondylosis without myelopathy or radiculopathy, lumbosacral region: Secondary | ICD-10-CM | POA: Diagnosis not present

## 2023-03-05 DIAGNOSIS — M5416 Radiculopathy, lumbar region: Secondary | ICD-10-CM | POA: Diagnosis not present

## 2023-03-05 DIAGNOSIS — Z981 Arthrodesis status: Secondary | ICD-10-CM | POA: Diagnosis not present

## 2023-03-11 ENCOUNTER — Ambulatory Visit (INDEPENDENT_AMBULATORY_CARE_PROVIDER_SITE_OTHER): Payer: Medicare Other

## 2023-03-11 DIAGNOSIS — E538 Deficiency of other specified B group vitamins: Secondary | ICD-10-CM | POA: Diagnosis not present

## 2023-03-11 NOTE — Progress Notes (Signed)
Patient: PIERRA BLANKINSHIP  DOB: 05-May-1941  MRN: [id number]    Visit Date: 03/11/2023    [name] presents today for her B12 injection.  Patient tolerated the injection well and has no questions.    Administrations This Visit     cyanocobalamin (VITAMIN B12) injection 1,000 mcg     Admin Date 03/11/2023 Action Given Dose 1,000 mcg Route Intramuscular Documented By Jacklynn Bue, LPN             Jacklynn Bue, LPN  40/98/11 9:14 AM

## 2023-03-25 ENCOUNTER — Ambulatory Visit (INDEPENDENT_AMBULATORY_CARE_PROVIDER_SITE_OTHER): Payer: Medicare Other

## 2023-03-25 DIAGNOSIS — E538 Deficiency of other specified B group vitamins: Secondary | ICD-10-CM | POA: Diagnosis not present

## 2023-03-25 NOTE — Progress Notes (Signed)
Patient: Alexandra [name]  DOB: [birth year]  MRN: [id number]    Visit Date: 03/25/2023    Alexandra [name] presents today for her B12 injection.  Patient tolerated the injection well and has no questions.    Administrations This Visit     cyanocobalamin (VITAMIN B12) injection 1,000 mcg     Admin Date 03/25/2023 Action Given Dose 1,000 mcg Route Intramuscular Documented By Jacklynn Bue, LPN             Jacklynn Bue, LPN  40/98/11 9:14 AM

## 2023-03-27 DIAGNOSIS — N189 Chronic kidney disease, unspecified: Secondary | ICD-10-CM | POA: Diagnosis not present

## 2023-03-27 DIAGNOSIS — D6489 Other specified anemias: Secondary | ICD-10-CM | POA: Diagnosis not present

## 2023-03-27 DIAGNOSIS — E211 Secondary hyperparathyroidism, not elsewhere classified: Secondary | ICD-10-CM | POA: Diagnosis not present

## 2023-03-27 DIAGNOSIS — J449 Chronic obstructive pulmonary disease, unspecified: Secondary | ICD-10-CM | POA: Diagnosis not present

## 2023-03-27 DIAGNOSIS — N184 Chronic kidney disease, stage 4 (severe): Secondary | ICD-10-CM | POA: Diagnosis not present

## 2023-03-27 DIAGNOSIS — J9611 Chronic respiratory failure with hypoxia: Secondary | ICD-10-CM | POA: Diagnosis not present

## 2023-03-27 DIAGNOSIS — E559 Vitamin D deficiency, unspecified: Secondary | ICD-10-CM | POA: Diagnosis not present

## 2023-03-27 DIAGNOSIS — I509 Heart failure, unspecified: Secondary | ICD-10-CM | POA: Diagnosis not present

## 2023-03-27 DIAGNOSIS — R309 Painful micturition, unspecified: Secondary | ICD-10-CM | POA: Diagnosis not present

## 2023-03-27 DIAGNOSIS — I10 Essential (primary) hypertension: Secondary | ICD-10-CM | POA: Diagnosis not present

## 2023-03-27 DIAGNOSIS — R809 Proteinuria, unspecified: Secondary | ICD-10-CM | POA: Diagnosis not present

## 2023-04-01 DIAGNOSIS — N390 Urinary tract infection, site not specified: Secondary | ICD-10-CM | POA: Diagnosis not present

## 2023-04-01 DIAGNOSIS — C649 Malignant neoplasm of unspecified kidney, except renal pelvis: Secondary | ICD-10-CM | POA: Diagnosis not present

## 2023-04-01 DIAGNOSIS — C679 Malignant neoplasm of bladder, unspecified: Secondary | ICD-10-CM | POA: Diagnosis not present

## 2023-04-02 ENCOUNTER — Ambulatory Visit: Payer: Self-pay | Admitting: Licensed Clinical Social Worker

## 2023-04-02 NOTE — Patient Instructions (Signed)
Visit Information  Thank you for taking time to visit with me today. Please don't hesitate to contact me if I can be of assistance to you.   Following are the goals we discussed today:   Goals Addressed             This Visit's Progress    patient trying to manage her medical needs and manage needs of her spouse       Interventions:  LCSW spoke via phone today with client about her needs and the needs of her spouse Discussed family support. She said she has some support from her daughter and son. They both work during the week Discussed medication procurement. Provided counseling support for client  Discussed ambulation of client. She uses a rollator walker to help her walk. She has to take periodic rest breaks when walking. She said use of rollator is helpful to her  Reviewed edema issues. She said she wears socks and bedroom slippers. She said she wears socks and slippers that are larger in size than normally worn to help with edema issues. Discussed sleeping issues. Discussed appetite of client. Discussed meal preparation for client. She cooks meals for herself and her spouse Discussed transport needs. She said she drives her car to and from appointments as needed Reviewed pain issues Encouraged client to call LCSW as needed for SW support at [phone] [facility] client for phone call with LCSW today          Our next appointment is by telephone on 05/13/23 at 2:30 PM   Please call the care guide team at [phone] if you need to cancel or reschedule your appointment.   If you are experiencing a Mental Health or Behavioral Health Crisis or need someone to talk to, please go to [facility] [address] [place] [phone])   The patient verbalized understanding of instructions, educational materials, and care plan provided today and DECLINED offer to receive copy of patient instructions, educational materials, and care plan.    The patient has been provided with contact information for the care management team and has been advised to call with any health related questions or concerns.   Kelton Pillar.Latreece Mochizuki MSW, LCSW Licensed Visual merchandiser [facility] Care Management [phone]

## 2023-04-02 NOTE — Patient Outreach (Signed)
Care Coordination   Follow Up Visit Note   04/02/2023 Name: Alexandra [name] MRN: [id number] DOB: 08/09/1940  Alexandra [name] is a 82 y.o. year old female who sees Cox, Fritzi Mandes, MD for primary care. I spoke with  [name] by phone today.  What matters to the patients health and wellness today?   Patient is trying to manage her medical needs and manage needs of her spouse    Goals Addressed             This Visit's Progress    patient trying to manage her medical needs and manage needs of her spouse       Interventions:  LCSW spoke via phone today with client about her needs and the needs of her spouse Discussed family support. She said she has some support from her daughter and son. They both work during the week Discussed medication procurement. Provided counseling support for client  Discussed ambulation of client. She uses a rollator walker to help her walk. She has to take periodic rest breaks when walking. She said use of rollator is helpful to her  Reviewed edema issues. She said she wears socks and bedroom slippers. She said she wears socks and slippers that are larger in size than normally worn to help with edema issues. Discussed sleeping issues. Discussed appetite of client. Discussed meal preparation for client. She cooks meals for herself and her spouse Discussed transport needs. She said she drives her car to and from appointments as needed Reviewed pain issues Encouraged client to call LCSW as needed for SW support at [phone] [facility] client for phone call with LCSW today          SDOH assessments and interventions completed:  Yes  SDOH Interventions Today    Flowsheet Row Most Recent Value  SDOH Interventions   Financial Strain Interventions Other (Comment)  [some financial challenges]  Physical Activity Interventions Other (Comments)  [she uses a rollator to help her walk]  Stress Interventions Provide Counseling  [client has some stress in  managing her medical needs]        Care Coordination Interventions:  Yes, provided   Interventions Today    Flowsheet Row Most Recent Value  Chronic Disease   Chronic disease during today's visit Other  [spoke with client about client needs]  General Interventions   General Interventions Discussed/Reviewed General Interventions Discussed, Community Resources  Education Interventions   Education Provided Provided Education  Provided Engineer, [employer] On [employer]  Mental Health Interventions   Mental Health Discussed/Reviewed Coping Strategies  [no mood issues noted]  Nutrition Interventions   Nutrition Discussed/Reviewed Nutrition Discussed  Pharmacy Interventions   Pharmacy Dicussed/Reviewed Pharmacy Topics Discussed  Safety Interventions   Safety Discussed/Reviewed Fall Risk        Follow up plan: Follow up call scheduled for 05/13/23 at 2:30 PM     Encounter Outcome:  Patient Visit Completed   Kelton Pillar.Maysoon Lozada MSW, LCSW Licensed Visual merchandiser [facility] Care Management [phone]

## 2023-04-08 ENCOUNTER — Ambulatory Visit (INDEPENDENT_AMBULATORY_CARE_PROVIDER_SITE_OTHER): Payer: Medicare Other

## 2023-04-08 DIAGNOSIS — E538 Deficiency of other specified B group vitamins: Secondary | ICD-10-CM

## 2023-04-08 NOTE — Progress Notes (Signed)
Patient: Alexandra [name]  DOB: 12/13/1940  MRN: [id number]    Visit Date: 04/08/2023    Alexandra [name] presents today for her B12 injection.  Patient tolerated the injection well and has no questions.    Administrations This Visit     cyanocobalamin (VITAMIN B12) injection 1,000 mcg     Admin Date 04/08/2023 Action Given Dose 1,000 mcg Route Intramuscular Documented By Jacklynn Bue, LPN             Jacklynn Bue, LPN  40/98/11 9:14 AM

## 2023-04-12 DIAGNOSIS — N390 Urinary tract infection, site not specified: Secondary | ICD-10-CM | POA: Diagnosis not present

## 2023-04-18 DIAGNOSIS — C678 Malignant neoplasm of overlapping sites of bladder: Secondary | ICD-10-CM | POA: Diagnosis not present

## 2023-04-18 DIAGNOSIS — J4489 Other specified chronic obstructive pulmonary disease: Secondary | ICD-10-CM | POA: Diagnosis not present

## 2023-04-18 DIAGNOSIS — I5032 Chronic diastolic (congestive) heart failure: Secondary | ICD-10-CM | POA: Diagnosis not present

## 2023-04-18 DIAGNOSIS — N135 Crossing vessel and stricture of ureter without hydronephrosis: Secondary | ICD-10-CM | POA: Diagnosis not present

## 2023-04-18 DIAGNOSIS — K5909 Other constipation: Secondary | ICD-10-CM | POA: Diagnosis not present

## 2023-04-18 DIAGNOSIS — M1990 Unspecified osteoarthritis, unspecified site: Secondary | ICD-10-CM | POA: Diagnosis not present

## 2023-04-18 DIAGNOSIS — Z87891 Personal history of nicotine dependence: Secondary | ICD-10-CM | POA: Diagnosis not present

## 2023-04-18 DIAGNOSIS — N131 Hydronephrosis with ureteral stricture, not elsewhere classified: Secondary | ICD-10-CM | POA: Diagnosis not present

## 2023-04-18 DIAGNOSIS — K219 Gastro-esophageal reflux disease without esophagitis: Secondary | ICD-10-CM | POA: Diagnosis not present

## 2023-04-18 DIAGNOSIS — Z8551 Personal history of malignant neoplasm of bladder: Secondary | ICD-10-CM | POA: Diagnosis not present

## 2023-04-18 DIAGNOSIS — M810 Age-related osteoporosis without current pathological fracture: Secondary | ICD-10-CM | POA: Diagnosis not present

## 2023-04-18 DIAGNOSIS — M109 Gout, unspecified: Secondary | ICD-10-CM | POA: Diagnosis not present

## 2023-04-18 DIAGNOSIS — C672 Malignant neoplasm of lateral wall of bladder: Secondary | ICD-10-CM | POA: Diagnosis not present

## 2023-04-18 DIAGNOSIS — I10 Essential (primary) hypertension: Secondary | ICD-10-CM | POA: Diagnosis not present

## 2023-04-18 DIAGNOSIS — I130 Hypertensive heart and chronic kidney disease with heart failure and stage 1 through stage 4 chronic kidney disease, or unspecified chronic kidney disease: Secondary | ICD-10-CM | POA: Diagnosis not present

## 2023-04-18 DIAGNOSIS — Z79899 Other long term (current) drug therapy: Secondary | ICD-10-CM | POA: Diagnosis not present

## 2023-04-18 DIAGNOSIS — N184 Chronic kidney disease, stage 4 (severe): Secondary | ICD-10-CM | POA: Diagnosis not present

## 2023-04-18 DIAGNOSIS — E785 Hyperlipidemia, unspecified: Secondary | ICD-10-CM | POA: Diagnosis not present

## 2023-04-18 DIAGNOSIS — C679 Malignant neoplasm of bladder, unspecified: Secondary | ICD-10-CM | POA: Diagnosis not present

## 2023-04-22 ENCOUNTER — Ambulatory Visit: Payer: Medicare Other

## 2023-05-07 DIAGNOSIS — N184 Chronic kidney disease, stage 4 (severe): Secondary | ICD-10-CM | POA: Diagnosis not present

## 2023-05-07 DIAGNOSIS — E211 Secondary hyperparathyroidism, not elsewhere classified: Secondary | ICD-10-CM | POA: Diagnosis not present

## 2023-05-07 DIAGNOSIS — R809 Proteinuria, unspecified: Secondary | ICD-10-CM | POA: Diagnosis not present

## 2023-05-07 DIAGNOSIS — I509 Heart failure, unspecified: Secondary | ICD-10-CM | POA: Diagnosis not present

## 2023-05-07 DIAGNOSIS — E872 Acidosis, unspecified: Secondary | ICD-10-CM | POA: Diagnosis not present

## 2023-05-07 DIAGNOSIS — D6489 Other specified anemias: Secondary | ICD-10-CM | POA: Diagnosis not present

## 2023-05-07 DIAGNOSIS — D520 Dietary folate deficiency anemia: Secondary | ICD-10-CM | POA: Diagnosis not present

## 2023-05-07 DIAGNOSIS — J9611 Chronic respiratory failure with hypoxia: Secondary | ICD-10-CM | POA: Diagnosis not present

## 2023-05-07 DIAGNOSIS — I10 Essential (primary) hypertension: Secondary | ICD-10-CM | POA: Diagnosis not present

## 2023-05-07 DIAGNOSIS — E875 Hyperkalemia: Secondary | ICD-10-CM | POA: Diagnosis not present

## 2023-05-07 DIAGNOSIS — R309 Painful micturition, unspecified: Secondary | ICD-10-CM | POA: Diagnosis not present

## 2023-05-07 DIAGNOSIS — M109 Gout, unspecified: Secondary | ICD-10-CM | POA: Diagnosis not present

## 2023-05-10 DIAGNOSIS — R809 Proteinuria, unspecified: Secondary | ICD-10-CM | POA: Insufficient documentation

## 2023-05-13 ENCOUNTER — Ambulatory Visit: Payer: Self-pay | Admitting: Licensed Clinical Social Worker

## 2023-05-13 NOTE — Patient Instructions (Signed)
Visit Information  Thank you for taking time to visit with me today. Please don't hesitate to contact me if I can be of assistance to you.   Following are the goals we discussed today:   Goals Addressed             This Visit's Progress    patient trying to manage her medical needs and manage needs of her spouse       Interventions:  LCSW spoke via phone today with client about her needs and the needs of her spouse Discussed family support. She said she has some support from her daughter and son. They both work during the week Discussed medication procurement. She said she had her prescribed medications and is taking medications as prescribed Provided counseling support for client Discussed ambulation of client. She uses a rollator walker to help her walk. She has to take periodic rest breaks when walking. She said use of rollator is helpful to her  Reviewed edema issues. She said she  has had some improvement in edema issues Discussed sleeping issues. Discussed appetite of client. Discussed meal preparation for client. She cooks meals for herself and her spouse Discussed transport needs. She said she drives her car to and from appointments as needed Discussed client support with PCP. Client said she has upcoming appointment with PCP and plans to talk with PCP about incontinence supplies for client. She said these supplies are getting costly to client.  Encouraged client to call LCSW as needed for SW support at [phone] [facility] client for phone call with LCSW today          Our next appointment is by telephone on 07/03/23 at 9:30 AM   Please call the care guide team at [phone] if you need to cancel or reschedule your appointment.   If you are experiencing a Mental Health or Behavioral Health Crisis or need someone to talk to, please go to [facility] [address] [place] [phone])   The patient verbalized understanding of  instructions, educational materials, and care plan provided today and DECLINED offer to receive copy of patient instructions, educational materials, and care plan.   The patient has been provided with contact information for the care management team and has been advised to call with any health related questions or concerns.   Kelton Pillar.Damoney Julia MSW, LCSW Licensed Visual merchandiser [facility] Care Management [phone]

## 2023-05-13 NOTE — Patient Outreach (Signed)
  Care Coordination   Follow Up Visit Note   05/13/2023 Name: Alexandra [name] MRN: [id number] DOB: 08-25-1940  Alexandra [name] is a 82 y.o. year old female who sees Cox, Fritzi Mandes, MD for primary care. I spoke with  Alexandra [name] by phone today.  What matters to the patients health and wellness today?  Patient trying to manage her medical needs  and manage needs of her spouse     Goals Addressed             This Visit's Progress    patient trying to manage her medical needs and manage needs of her spouse       Interventions:  LCSW spoke via phone today with client about her needs and the needs of her spouse Discussed family support. She said she has some support from her daughter and son. They both work during the week Discussed medication procurement. She said she had her prescribed medications and is taking medications as prescribed Provided counseling support for client Discussed ambulation of client. She uses a rollator walker to help her walk. She has to take periodic rest breaks when walking. She said use of rollator is helpful to her  Reviewed edema issues. She said she  has had some improvement in edema issues Discussed sleeping issues. Discussed appetite of client. Discussed meal preparation for client. She cooks meals for herself and her spouse Discussed transport needs. She said she drives her car to and from appointments as needed Discussed client support with PCP. Client said she has upcoming appointment with PCP and plans to talk with PCP about incontinence supplies for client. She said these supplies are getting costly to client.  Encouraged client to call LCSW as needed for SW support at [phone] [facility] client for phone call with LCSW today          SDOH assessments and interventions completed:  Yes  SDOH Interventions Today    Flowsheet Row Most Recent Value  SDOH Interventions   Physical Activity Interventions Other (Comments)  [mobility challenges]   Stress Interventions Provide Counseling  [stress in managing her medical needs,  stress in managing needs of her spouse]        Care Coordination Interventions:  Yes, provided   Interventions Today    Flowsheet Row Most Recent Value  Chronic Disease   Chronic disease during today's visit Other  [spoke with client about client needs]  General Interventions   General Interventions Discussed/Reviewed General Interventions Discussed, Community Resources  Education Interventions   Education Provided Provided Education  Provided Verbal Education On [employer]  Mental Health Interventions   Mental Health Discussed/Reviewed Coping Strategies  [has stress in managing her medical needs,  has stress in managing needs of her spouse]  Nutrition Interventions   Nutrition Discussed/Reviewed Nutrition Discussed  Pharmacy Interventions   Pharmacy Dicussed/Reviewed Pharmacy Topics Discussed        Follow up plan: Follow up call scheduled for 07/03/23 at 9:30 AM    Encounter Outcome:  Patient Visit Completed   Kelton Pillar.Zaydee Aina MSW, LCSW Licensed Visual merchandiser [facility] Care Management [phone]

## 2023-05-21 ENCOUNTER — Encounter: Payer: Medicare Other | Admitting: Family Medicine

## 2023-05-21 NOTE — Progress Notes (Signed)
Cancelled.

## 2023-05-22 ENCOUNTER — Ambulatory Visit: Payer: Medicare Other | Admitting: Family Medicine

## 2023-05-22 ENCOUNTER — Encounter: Payer: Self-pay | Admitting: Family Medicine

## 2023-05-22 VITALS — BP 138/72 | HR 95 | Temp 98.2°F | Resp 16 | Ht 62.0 in | Wt 143.4 lb

## 2023-05-22 DIAGNOSIS — I10 Essential (primary) hypertension: Secondary | ICD-10-CM

## 2023-05-22 DIAGNOSIS — I5032 Chronic diastolic (congestive) heart failure: Secondary | ICD-10-CM | POA: Diagnosis not present

## 2023-05-22 DIAGNOSIS — E782 Mixed hyperlipidemia: Secondary | ICD-10-CM

## 2023-05-22 DIAGNOSIS — J302 Other seasonal allergic rhinitis: Secondary | ICD-10-CM | POA: Diagnosis not present

## 2023-05-22 DIAGNOSIS — I132 Hypertensive heart and chronic kidney disease with heart failure and with stage 5 chronic kidney disease, or end stage renal disease: Secondary | ICD-10-CM | POA: Diagnosis not present

## 2023-05-22 DIAGNOSIS — N185 Chronic kidney disease, stage 5: Secondary | ICD-10-CM | POA: Diagnosis not present

## 2023-05-22 DIAGNOSIS — E039 Hypothyroidism, unspecified: Secondary | ICD-10-CM

## 2023-05-22 DIAGNOSIS — M19042 Primary osteoarthritis, left hand: Secondary | ICD-10-CM | POA: Diagnosis not present

## 2023-05-22 DIAGNOSIS — I110 Hypertensive heart disease with heart failure: Secondary | ICD-10-CM | POA: Diagnosis not present

## 2023-05-22 DIAGNOSIS — N3946 Mixed incontinence: Secondary | ICD-10-CM

## 2023-05-22 DIAGNOSIS — M19041 Primary osteoarthritis, right hand: Secondary | ICD-10-CM

## 2023-05-22 DIAGNOSIS — C679 Malignant neoplasm of bladder, unspecified: Secondary | ICD-10-CM

## 2023-05-22 DIAGNOSIS — D518 Other vitamin B12 deficiency anemias: Secondary | ICD-10-CM

## 2023-05-22 MED ORDER — ZIKS ARTHRITIS PAIN RELIEF 0.025-1-12 % EX CREA: 1.0000 | TOPICAL_CREAM | Freq: Two times a day (BID) | CUTANEOUS | 0 refills | Status: DC | PRN

## 2023-05-22 MED ORDER — GUAIFENESIN-DM 100-10 MG/5ML PO SYRP: 5.0000 mL | ORAL_SOLUTION | ORAL | 0 refills | Status: DC | PRN

## 2023-05-22 NOTE — Assessment & Plan Note (Signed)
Check labs 

## 2023-05-22 NOTE — Progress Notes (Signed)
Subjective:  Patient ID: Alexandra [name], female    DOB: [birth year]  Age: 82 y.o. MRN: [id number]  Chief Complaint  Patient presents with   Medical Management of Chronic Issues   Cough   Discussed the use of AI scribe software for clinical note transcription with the patient, who gave verbal consent to proceed.    HPI  CKD stage 5.   Last microalb/creat ratio - 1,058.  Sees nephrology.   Osteoporosis:   vitamin D 41324 units daily.     Hyperlipidemia: Last total cholesterol - 161, HDL - 60, Trig - 86, LDL - 85 Patient tries to eat a low cholesterol diet.    Bladder Cancer: Off of chemotherapy for several months.  Is no longer following with oncology doctor Dr. Saddie Benders. 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 They currently use pull-ups and pads daily for and would like to know if we can order some for her through insurance if they will cover because she can hardly afford 6-8 adult diapers/day.   GERD:  On pantoprazole 40 mg daily. Denies reflux.    RLS:  Patient taking ropinirole 2 mg twice daily. Patient is compliant with medication and has no issues with medication.    Hypertensive heart failure: Last recorded BP 120/78 They take amlodipine 10 mg daily and torsemide 20 mg daily as needed. Patient is compliant with medication and has no issues with medication.    Hypothyroidism:  Last TSH 5.230 Taking synthroid 75 mcg once daily in am. Patient is compliant with medication and has no issues with medication.   Arthritis: She presents with worsening joint pain in their hands. The pain is severe enough to disrupt their sleep and interfere with daily  activities, such as wringing out a rag. They have tried Tylenol and various topical creams, including Voltaren gel, without relief. They have not had steroid injections in their hands.  Cough: The patient also reports a new cough that has been present for three days, accompanied by sneezing and a runny nose. They have not taken any antihistamines for these symptoms. They deny any chest pain, shortness of breath, or sore throat. She has not been around anyone that is sick. Patient would like a cough medication.         02/26/2023    9:17 AM 01/25/2023    9:34 AM 01/07/2023    1:38 PM 09/17/2022    8:59 AM 08/29/2022    2:30 PM  Depression screen PHQ 2/9  Decreased Interest 0 0 0 0 0  Down, Depressed, Hopeless 0 0 0 0 0  PHQ - 2 Score 0 0 0 0 0  Altered sleeping 0 0     Tired, decreased energy 0 0     Change in appetite 0 0     Feeling bad or failure about yourself  0 0     Trouble concentrating 0 0     Moving slowly or fidgety/restless 0 0     Suicidal thoughts 0 0     PHQ-9 Score 0 0     Difficult doing work/chores Not difficult at all Not difficult at all           02/26/2023    9:04 AM  Fall Risk   Falls in the past year? 0  Number falls in past yr: 0  Injury with Fall? 0  Risk for fall due to : No Fall Risks  Follow up Falls evaluation completed;Education provided    Patient Care Team: Blane Ohara, MD as PCP - General (Internal Medicine) Loni Muse, MD as Consulting Physician (Internal Medicine) Bufford Buttner, MD as Consulting Physician (Nephrology) Debroah Baller, MD as Consulting Physician (Urology) Dulce Sellar Iline Oven, MD as Consulting Physician (Cardiology) Gabriel Carina, [facility] (Inactive) (Pharmacist)   Review of Systems  Constitutional:  Negative for appetite change, chills, diaphoresis, fatigue and fever.  HENT:  Positive for rhinorrhea and sneezing. Negative for congestion, sinus pain and sore throat.   Respiratory:  Positive for cough (productive).  Negative for shortness of breath.   Cardiovascular:  Negative for chest pain.  Gastrointestinal: Negative.   Genitourinary: Negative.   Musculoskeletal:  Positive for arthralgias (hands).  Skin:  Negative for rash.  Neurological:  Negative for headaches.  Psychiatric/Behavioral:  Positive for sleep disturbance. Negative for dysphoric mood. The patient is not nervous/anxious.     Current Outpatient Medications on File Prior to Visit  Medication Sig Dispense Refill   amLODipine (NORVASC) 10 MG tablet TAKE 1 TABLET(10 MG) BY MOUTH DAILY 90 tablet 1   atorvastatin (LIPITOR) 40 MG tablet Take 1 tablet (40 mg total) by mouth daily. Per ccm 90 tablet 1   benzonatate (TESSALON PERLES) 100 MG capsule Take 1 capsule (100 mg total) by mouth 3 (three) times daily as needed for cough. 30 capsule 1   Budeson-Glycopyrrol-Formoterol (BREZTRI AEROSPHERE) 160-9-4.8 MCG/ACT AERO Inhale 2 puffs into the lungs in the morning and at bedtime. 10.7 g 4   Cholecalciferol (VITAMIN D) 125 MCG (5000 UT) CAPS Take 1 capsule by mouth daily.     cyanocobalamin (VITAMIN B12) 1000 MCG/ML injection Inject 1 mL (1,000 mcg total) into the muscle every 14 (fourteen) days. 4 mL 1   levalbuterol (XOPENEX) 1.25 MG/3ML nebulizer solution Take 1.25 mg by nebulization 2 (two) times daily.     melatonin 1 MG TABS tablet Take 1 tablet (1 mg total) by mouth at bedtime. 30 tablet 1   nitroGLYCERIN (NITROSTAT) 0.4 MG SL tablet Place 1 tablet (0.4 mg total) under the tongue every 5 (five) minutes as needed for chest pain. 30 tablet 3   pantoprazole (PROTONIX) 40 MG tablet TAKE 1 TABLET(40 MG) BY MOUTH DAILY 90 tablet 1   rOPINIRole (REQUIP) 2 MG tablet Take 1 tablet (2 mg total) by mouth 2 (two) times daily. 60 tablet 2   sodium zirconium cyclosilicate (LOKELMA) 5 g packet Take 5 g by mouth daily.     SYNTHROID 75 MCG tablet Take 1 tablet (75 mcg total) by mouth daily before breakfast. 90 tablet 0   torsemide (DEMADEX) 20 MG tablet Take 1  tablet (20 mg total) by mouth daily as needed (edema). 30 tablet 2   traMADol (ULTRAM) 50 MG tablet Take by mouth.     VENTOLIN HFA 108 (90 Base) MCG/ACT inhaler Inhale 1-2 puffs into the lungs every 6 (six) hours as needed for wheezing or shortness of breath. 18 g 2   ferrous sulfate 325 (65 FE) MG tablet Take 1 tablet (325 mg total) by mouth daily. 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 82 yo   BACK SURGERY  2016   lower L 4 to L 5   bladder tack and uterus lift  10/10/2015   CHOLECYSTECTOMY     summer of 2022   CYSTOSCOPY WITH RETROGRADE PYELOGRAM, URETEROSCOPY AND STENT PLACEMENT Right 08/04/2022   Procedure: CYSTOSCOPY WITH RIGHT URETERAL STENT EXCHANGE;  Surgeon: Despina Arias, MD;  Location[facility];  Service: Urology;  Laterality: Right;   IR RADIOLOGIST EVAL & MGMT  12/03/2018   IR RADIOLOGIST EVAL & MGMT  12/31/2018   IR REMOVAL TUN ACCESS W/ PORT W/O FL MOD SED  08/08/2022   PARATHYROIDECTOMY Left 07/02/2016   Procedure: LEFT INFERIOR PARATHYROIDECTOMY;  Surgeon: Darnell Level, MD;  Location: [facility] ORS;  Service: General;  Laterality: Left;   SHOULDER OPEN ROTATOR CUFF REPAIR  12/2019   TEE WITHOUT CARDIOVERSION N/A 08/10/2022   Procedure:  TRANSESOPHAGEAL ECHOCARDIOGRAM (TEE);  Surgeon: Chrystie Nose, MD;  Location: [facility] ENDOSCOPY;  Service: Cardiovascular;  Laterality: N/A;   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    Family History  Problem Relation Age of Onset   Emphysema Mother    Hyperlipidemia Sister    Hypertension Sister    COPD Sister    Hyperlipidemia Sister    Hypertension Sister    Lymphoma Sister    Obesity Brother    Hyperlipidemia Brother    Hypertension Brother    Breast cancer Neg Hx    Social History   Socioeconomic History   Marital status: Married    Spouse name: Molly Maduro   Number of children: 2   Years of education: 11   Highest education level: 11th grade  Occupational History   Occupation: Retired  Tobacco Use   Smoking status: Former    Current packs/day: 0.00    Average packs/day: 1 pack/day for 50.0 years (50.0 ttl pk-yrs)    Types: Cigarettes    Start date: 06/12/1955    Quit date: 06/11/2005    Years since quitting: 17.9   Smokeless tobacco: Never  Vaping Use   Vaping status: Never Used  Substance and Sexual Activity   Alcohol use: No   Drug use: No   Sexual activity: Yes    Partners: Male  Other Topics Concern   Not on file  Social History Narrative   Not on file   Social Determinants of Health   Financial Resource Strain: Medium Risk (04/02/2023)   Overall Financial Resource Strain (CARDIA)    Difficulty of Paying Living Expenses: Somewhat hard  Food Insecurity: No Food Insecurity (02/26/2023)   Hunger Vital Sign    Worried About Running Out of Food in the Last Year: Never true    Ran Out of Food in the Last Year: Never true  Transportation Needs: No Transportation Needs (02/26/2023)   PRAPARE - Administrator, Civil Service (Medical): No    Lack of Transportation (  Non-Medical): No  Physical Activity: Inactive (05/13/2023)   Exercise Vital Sign    Days of Exercise per Week: 0 days    Minutes of Exercise per Session: 0 min  Stress: Stress Concern  Present (05/13/2023)   [employer] of Occupational Health - Occupational Stress Questionnaire    Feeling of Stress : To some extent  Social Connections: Moderately Integrated (02/26/2023)   Social Connection and Isolation Panel [NHANES]    Frequency of Communication with Friends and Family: More than three times a week    Frequency of Social Gatherings with Friends and Family: More than three times a week    Attends Religious Services: More than 4 times per year    Active Member of [employer] or Organizations: No    Attends Engineer, structural: Never    Marital Status: Married    Objective:  BP 138/72 (BP Location: Right Arm, Patient Position: Sitting, Cuff Size: Normal)   Pulse 95   Temp 98.2 F (36.8 C) (Temporal)   Resp 16   Ht 5\' 2"  (1.575 m)   Wt 143 lb 6.4 oz (65 kg)   SpO2 99%   BMI 26.23 kg/m      05/22/2023    8:54 AM 02/26/2023    9:01 AM 01/25/2023    9:35 AM  BP/Weight  Systolic BP 138 120 144  Diastolic BP 72 78 64  Wt. (Lbs) 143.4    BMI 26.23 kg/m2      Physical Exam Vitals reviewed.  Constitutional:      General: She is not in acute distress.    Appearance: Normal appearance.  Cardiovascular:     Rate and Rhythm: Normal rate and regular rhythm.     Heart sounds: Normal heart sounds.  Pulmonary:     Effort: Pulmonary effort is normal.     Breath sounds: Normal breath sounds.  Abdominal:     General: Bowel sounds are normal.     Palpations: Abdomen is soft.     Tenderness: There is no abdominal tenderness.  Neurological:     Mental Status: She is alert. Mental status is at baseline.  Psychiatric:        Mood and Affect: Mood normal.        Behavior: Behavior normal.       Lab Results  Component Value Date   WBC 11.5 (H) 01/25/2023   HGB 8.8 (L) 01/25/2023   HCT 28.0 (L) 01/25/2023   PLT 400 01/25/2023   GLUCOSE 81 01/25/2023   CHOL 161 01/25/2023   TRIG 86 01/25/2023   HDL 60 01/25/2023   LDLCALC 85 01/25/2023   ALT 11  01/25/2023   AST 14 01/25/2023   NA 141 01/25/2023   K 5.3 (H) 01/25/2023   CL 106 01/25/2023   CREATININE 3.02 (H) 01/25/2023   BUN 57 (H) 01/25/2023   CO2 19 (L) 01/25/2023   TSH 5.230 (H) 01/25/2023   INR 1.1 08/04/2022   HGBA1C 5.6 04/04/2021      Assessment & Plan:    Chronic kidney disease, stage V (HCC) Assessment & Plan: Defer management to nephrology. Per their notes, patient is not interested in dialysis.  Orders: -     Microalbumin / creatinine urine ratio  Mixed hyperlipidemia Assessment & Plan: Well controlled.  No changes to medicines. Continue lipitor 40 mg before bed.  Continue to work on eating a healthy diet and exercise as tolerated Labs drawn today.    Orders: -  Lipid panel  Acquired hypothyroidism Assessment & Plan: Previously not at goal. Labs drawn Await labs/testing for assessment and recommendations   Orders: -     TSH -     T4, free  Hypertensive heart disease with chronic diastolic congestive heart failure (HCC) Assessment & Plan: Continue torsemide 20 mg twice daily.   Orders: -     Comprehensive metabolic panel -     CBC with Differential/Platelet  Other vitamin B12 deficiency anemia Assessment & Plan: Check labs  Orders: -     Vitamin B12  Arthritis of both hands Assessment & Plan: Severe pain in hands, affecting sleep and daily activities. Previous trials of Tylenol and various creams have not provided relief. -Trial of topical capsaicin cream for pain relief.   Orders: -     Ziks Arthritis Pain Relief; Apply 1 application  topically 2 (two) times daily as needed.  Dispense: 56.6 g; Refill: 0  Acute seasonal allergic rhinitis Assessment & Plan: Cough and sneezing for three days, with white sputum. No associated fever, fatigue, night sweats, chills, or change in appetite. -Prescribe cough syrup (5 ml every 4 hours as needed). -Try taking an antihistamine once daily for allergies  Orders: -      guaiFENesin-DM; Take 5 mLs by mouth every 4 (four) hours as needed for cough.  Dispense: 118 mL; Refill: 0  Mixed stress and urge urinary incontinence  Essential hypertension Assessment & Plan: Taking amlodipine 10 mg daily, Torsemide 20 mg daily    Nutrition: Stressed importance of moderation in sodium intake, saturated fat and cholesterol, caloric balance, sufficient intake of complex carbohydrates, fiber, calcium and iron.   Exercise:as tolerated   Malignant neoplasm of urinary bladder, unspecified site [facility]) Assessment & Plan: No further chemo scheduled. Managed by specialist (Dr. Saddie Benders) oncology      Meds ordered this encounter  Medications   Capsaicin-Menthol-Methyl Sal (CAPSAICIN-METHYL SAL-MENTHOL) 0.025-1-12 % CREA    Sig: Apply 1 application  topically 2 (two) times daily as needed.    Dispense:  56.6 g    Refill:  0   guaiFENesin-dextromethorphan (ROBITUSSIN DM) 100-10 MG/5ML syrup    Sig: Take 5 mLs by mouth every 4 (four) hours as needed for cough.    Dispense:  118 mL    Refill:  0    Orders Placed This Encounter  Procedures   Comprehensive metabolic panel   CBC with Differential   Lipid Panel   TSH   T4, Free   Microalbumin/Creatinine Ratio, Urine   Vitamin B12     Follow-up: Return in about 3 months (around 08/20/2023) for chronic.   I,Angela Taylor,acting as a Neurosurgeon for Renne Crigler, FNP.,have documented all relevant documentation on the behalf of Renne Crigler, FNP,as directed by  Renne Crigler, FNP while in the presence of Renne Crigler, FNP.   An After Visit Summary was printed and given to the patient.  Lajuana Matte, FNP [facility] [phone]

## 2023-05-22 NOTE — Assessment & Plan Note (Addendum)
Previously not at goal. Labs drawn Await labs/testing for assessment and recommendations

## 2023-05-22 NOTE — Assessment & Plan Note (Signed)
Continue torsemide 20 mg twice daily  °

## 2023-05-22 NOTE — Assessment & Plan Note (Signed)
Well controlled.  No changes to medicines. Continue lipitor 40 mg before bed.  Continue to work on eating a healthy diet and exercise as tolerated Labs drawn today.

## 2023-05-22 NOTE — Assessment & Plan Note (Signed)
Defer management to nephrology. Per their notes, patient is not interested in dialysis.

## 2023-05-23 ENCOUNTER — Other Ambulatory Visit: Payer: Self-pay | Admitting: Family Medicine

## 2023-05-23 DIAGNOSIS — E039 Hypothyroidism, unspecified: Secondary | ICD-10-CM

## 2023-05-23 LAB — COMPREHENSIVE METABOLIC PANEL
ALT: 11 [IU]/L (ref 0–32)
AST: 14 [IU]/L (ref 0–40)
Albumin: 4 g/dL (ref 3.8–4.8)
Alkaline Phosphatase: 132 [IU]/L — ABNORMAL HIGH (ref 44–121)
BUN/Creatinine Ratio: 21 (ref 12–28)
BUN: 58 mg/dL — ABNORMAL HIGH (ref 8–27)
Bilirubin Total: 0.2 mg/dL (ref 0.0–1.2)
CO2: 18 mmol/L — ABNORMAL LOW (ref 20–29)
Calcium: 9.1 mg/dL (ref 8.7–10.3)
Chloride: 106 mmol/L (ref 96–106)
Creatinine, Ser: 2.79 mg/dL — ABNORMAL HIGH (ref 0.57–1.00)
Globulin, Total: 2.4 g/dL (ref 1.5–4.5)
Glucose: 104 mg/dL — ABNORMAL HIGH (ref 70–99)
Potassium: 5.4 mmol/L — ABNORMAL HIGH (ref 3.5–5.2)
Sodium: 141 mmol/L (ref 134–144)
Total Protein: 6.4 g/dL (ref 6.0–8.5)
eGFR: 17 mL/min/{1.73_m2} — ABNORMAL LOW (ref >59)

## 2023-05-23 LAB — LIPID PANEL
Chol/HDL Ratio: 3 {ratio} (ref 0.0–4.4)
Cholesterol, Total: 138 mg/dL (ref 100–199)
HDL: 46 mg/dL (ref >39)
LDL Chol Calc (NIH): 74 mg/dL (ref 0–99)
Triglycerides: 93 mg/dL (ref 0–149)
VLDL Cholesterol Cal: 18 mg/dL (ref 5–40)

## 2023-05-23 LAB — CBC WITH DIFFERENTIAL/PLATELET
Basophils Absolute: 0 10*3/uL (ref 0.0–0.2)
Basos: 0 %
EOS (ABSOLUTE): 0.4 10*3/uL (ref 0.0–0.4)
Eos: 4 %
Hematocrit: 28.4 % — ABNORMAL LOW (ref 34.0–46.6)
Hemoglobin: 9.3 g/dL — ABNORMAL LOW (ref 11.1–15.9)
Immature Grans (Abs): 0.1 10*3/uL (ref 0.0–0.1)
Immature Granulocytes: 1 %
Lymphocytes Absolute: 2.2 10*3/uL (ref 0.7–3.1)
Lymphs: 24 %
MCH: 30.5 pg (ref 26.6–33.0)
MCHC: 32.7 g/dL (ref 31.5–35.7)
MCV: 93 fL (ref 79–97)
Monocytes Absolute: 0.8 10*3/uL (ref 0.1–0.9)
Monocytes: 8 %
Neutrophils Absolute: 6 10*3/uL (ref 1.4–7.0)
Neutrophils: 63 %
Platelets: 372 10*3/uL (ref 150–450)
RBC: 3.05 x10E6/uL — ABNORMAL LOW (ref 3.77–5.28)
RDW: 13.9 % (ref 11.7–15.4)
WBC: 9.5 10*3/uL (ref 3.4–10.8)

## 2023-05-23 LAB — T4, FREE: Free T4: 1.07 ng/dL (ref 0.82–1.77)

## 2023-05-23 LAB — MICROALBUMIN / CREATININE URINE RATIO
Creatinine, Urine: 40.3 mg/dL
Microalb/Creat Ratio: 1057 mg/g{creat} — ABNORMAL HIGH (ref 0–29)
Microalbumin, Urine: 426 ug/mL

## 2023-05-23 LAB — TSH: TSH: 8.98 u[IU]/mL — ABNORMAL HIGH (ref 0.450–4.500)

## 2023-05-23 LAB — VITAMIN B12: Vitamin B-12: 870 pg/mL (ref 232–1245)

## 2023-05-23 MED ORDER — LEVOTHYROXINE SODIUM 100 MCG PO TABS: 100.0000 ug | ORAL_TABLET | Freq: Every day | ORAL | 0 refills | Status: DC

## 2023-05-23 NOTE — Assessment & Plan Note (Signed)
Taking amlodipine 10 mg daily, Torsemide 20 mg daily    Nutrition: Stressed importance of moderation in sodium intake, saturated fat and cholesterol, caloric balance, sufficient intake of complex carbohydrates, fiber, calcium and iron.   Exercise:as tolerated

## 2023-05-23 NOTE — Assessment & Plan Note (Signed)
No further chemo scheduled. Managed by specialist (Dr. Saddie Benders) oncology

## 2023-05-23 NOTE — Assessment & Plan Note (Signed)
Cough and sneezing for three days, with white sputum. No associated fever, fatigue, night sweats, chills, or change in appetite. -Prescribe cough syrup (5 ml every 4 hours as needed). -Try taking an antihistamine once daily for allergies

## 2023-05-23 NOTE — Assessment & Plan Note (Signed)
Severe pain in hands, affecting sleep and daily activities. Previous trials of Tylenol and various creams have not provided relief. -Trial of topical capsaicin cream for pain relief.

## 2023-05-28 ENCOUNTER — Other Ambulatory Visit: Payer: Self-pay

## 2023-05-28 DIAGNOSIS — E039 Hypothyroidism, unspecified: Secondary | ICD-10-CM

## 2023-05-28 MED ORDER — LEVOTHYROXINE SODIUM 100 MCG PO TABS: 100.0000 ug | ORAL_TABLET | Freq: Every day | ORAL | 0 refills | Status: DC

## 2023-05-31 ENCOUNTER — Ambulatory Visit: Payer: Medicare Other | Admitting: Family Medicine

## 2023-05-31 ENCOUNTER — Encounter: Payer: Self-pay | Admitting: Family Medicine

## 2023-05-31 VITALS — BP 130/74 | HR 84 | Temp 97.8°F | Ht 62.0 in | Wt 142.0 lb

## 2023-05-31 DIAGNOSIS — M79642 Pain in left hand: Secondary | ICD-10-CM | POA: Diagnosis not present

## 2023-05-31 DIAGNOSIS — D631 Anemia in chronic kidney disease: Secondary | ICD-10-CM | POA: Diagnosis not present

## 2023-05-31 DIAGNOSIS — N185 Chronic kidney disease, stage 5: Secondary | ICD-10-CM

## 2023-05-31 DIAGNOSIS — R7301 Impaired fasting glucose: Secondary | ICD-10-CM

## 2023-05-31 DIAGNOSIS — M79641 Pain in right hand: Secondary | ICD-10-CM

## 2023-05-31 DIAGNOSIS — N184 Chronic kidney disease, stage 4 (severe): Secondary | ICD-10-CM | POA: Diagnosis not present

## 2023-05-31 NOTE — Progress Notes (Signed)
Acute Office Visit  Subjective:    Patient ID: Alexandra [name], female    DOB: Oct 31, 1940, 82 y.o.   MRN: [id number]  Chief Complaint  Patient presents with   Arthritis    Discussed the use of AI scribe software for clinical note transcription with the patient, who gave verbal consent to proceed.  HPI: The patient presents with bilateral hand pain, worse on the right ( no sleep the last 3 nights), and numbness in the fingertips. She has tried tylenol which has not helped, topical creams have not helped much either.They describe the pain as very tender and report that heat application provides some relief. They deny any history of hand injuries. They have a family history of arthritis, with their mother, brother, and sister all having been diagnosed.   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 82 yo   BACK SURGERY  2016   lower L 4 to L 5   bladder tack and uterus lift  10/10/2015   CHOLECYSTECTOMY     summer of 2022   CYSTOSCOPY WITH RETROGRADE PYELOGRAM, URETEROSCOPY AND STENT PLACEMENT Right 08/04/2022   Procedure: CYSTOSCOPY WITH RIGHT URETERAL STENT EXCHANGE;  Surgeon: Despina Arias, MD;  Location: [facility] OR;  Service: Urology;  Laterality: Right;   IR RADIOLOGIST EVAL & MGMT  12/03/2018   IR RADIOLOGIST EVAL & MGMT  12/31/2018   IR REMOVAL TUN ACCESS W/ PORT W/O FL MOD SED  08/08/2022   PARATHYROIDECTOMY Left 07/02/2016   Procedure: LEFT INFERIOR PARATHYROIDECTOMY;  Surgeon: Darnell Level, MD;  Location: [facility]  ORS;  Service: General;  Laterality: Left;   SHOULDER OPEN ROTATOR CUFF REPAIR  12/2019   TEE WITHOUT CARDIOVERSION N/A 08/10/2022   Procedure: TRANSESOPHAGEAL ECHOCARDIOGRAM (TEE);  Surgeon: Chrystie Nose, MD;  Location: [facility] ENDOSCOPY;  Service: Cardiovascular;  Laterality: N/A;   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    Family History  Problem Relation Age of Onset   Emphysema Mother    Hyperlipidemia Sister    Hypertension Sister    COPD Sister    Hyperlipidemia Sister    Hypertension Sister    Lymphoma Sister    Obesity Brother    Hyperlipidemia Brother    Hypertension Brother    Breast cancer Neg Hx     Social History   Socioeconomic History   Marital status: Married    Spouse name: Molly Maduro   Number of children: 2   Years of education: 11   Highest education level: 11th grade  Occupational History   Occupation: Retired  Tobacco Use   Smoking status: Former    Current packs/day: 0.00    Average packs/day: 1 pack/day for 50.0 years (50.0 ttl pk-yrs)    Types: Cigarettes    Start date: 06/12/1955    Quit date: 06/11/2005    Years since quitting: 17.9   Smokeless tobacco: Never  Vaping Use   Vaping status: Never Used  Substance and Sexual Activity   Alcohol use: No  Drug use: No   Sexual activity: Yes    Partners: Male  Other Topics Concern   Not on file  Social History Narrative   Not on file   Social Drivers of Health   Financial Resource Strain: Medium Risk (04/02/2023)   Overall Financial Resource Strain (CARDIA)    Difficulty of Paying Living Expenses: Somewhat hard  Food Insecurity: No Food Insecurity (02/26/2023)   Hunger Vital Sign    Worried About Running Out of Food in the Last Year: Never true    Ran Out of Food in the Last Year: Never true  Transportation Needs: No Transportation Needs (02/26/2023)   PRAPARE - Administrator, Civil Service (Medical): No    Lack of Transportation (Non-Medical): No  Physical Activity: Inactive  (05/13/2023)   Exercise Vital Sign    Days of Exercise per Week: 0 days    Minutes of Exercise per Session: 0 min  Stress: Stress Concern Present (05/13/2023)   [employer] of Occupational Health - Occupational Stress Questionnaire    Feeling of Stress : To some extent  Social Connections: Moderately Integrated (02/26/2023)   Social Connection and Isolation Panel [NHANES]    Frequency of Communication with Friends and Family: More than three times a week    Frequency of Social Gatherings with Friends and Family: More than three times a week    Attends Religious Services: More than 4 times per year    Active Member of [employer] or Organizations: No    Attends Banker Meetings: Never    Marital Status: Married  Catering manager Violence: Not At Risk (02/26/2023)   Humiliation, Afraid, Rape, and Kick questionnaire    Fear of Current or Ex-Partner: No    Emotionally Abused: No    Physically Abused: No    Sexually Abused: No    Outpatient Medications Prior to Visit  Medication Sig Dispense Refill   amLODipine (NORVASC) 10 MG tablet TAKE 1 TABLET(10 MG) BY MOUTH DAILY 90 tablet 1   atorvastatin (LIPITOR) 40 MG tablet Take 1 tablet (40 mg total) by mouth daily. Per ccm 90 tablet 1   Budeson-Glycopyrrol-Formoterol (BREZTRI AEROSPHERE) 160-9-4.8 MCG/ACT AERO Inhale 2 puffs into the lungs in the morning and at bedtime. 10.7 g 4   Capsaicin-Menthol-Methyl Sal (CAPSAICIN-METHYL SAL-MENTHOL) 0.025-1-12 % CREA Apply 1 application  topically 2 (two) times daily as needed. 56.6 g 0   Cholecalciferol (VITAMIN D) 125 MCG (5000 UT) CAPS Take 1 capsule by mouth daily.     cyanocobalamin (VITAMIN B12) 1000 MCG/ML injection Inject 1 mL (1,000 mcg total) into the muscle every 14 (fourteen) days. 4 mL 1   ferrous sulfate 325 (65 FE) MG tablet Take 1 tablet (325 mg total) by mouth daily. 90 tablet 0   guaiFENesin-dextromethorphan (ROBITUSSIN DM) 100-10 MG/5ML syrup Take 5 mLs by mouth every 4  (four) hours as needed for cough. 118 mL 0   levalbuterol (XOPENEX) 1.25 MG/3ML nebulizer solution Take 1.25 mg by nebulization 2 (two) times daily.     levothyroxine (SYNTHROID) 100 MCG tablet Take 1 tablet (100 mcg total) by mouth daily. 90 tablet 0   melatonin 1 MG TABS tablet Take 1 tablet (1 mg total) by mouth at bedtime. 30 tablet 1   nitroGLYCERIN (NITROSTAT) 0.4 MG SL tablet Place 1 tablet (0.4 mg total) under the tongue every 5 (five) minutes as needed for chest pain. 30 tablet 3   pantoprazole (PROTONIX) 40 MG tablet TAKE 1 TABLET(40 MG) BY MOUTH  DAILY 90 tablet 1   rOPINIRole (REQUIP) 2 MG tablet Take 1 tablet (2 mg total) by mouth 2 (two) times daily. 60 tablet 2   sodium zirconium cyclosilicate (LOKELMA) 5 g packet Take 5 g by mouth daily.     torsemide (DEMADEX) 20 MG tablet Take 1 tablet (20 mg total) by mouth daily as needed (edema). 30 tablet 2   VENTOLIN HFA 108 (90 Base) MCG/ACT inhaler Inhale 1-2 puffs into the lungs every 6 (six) hours as needed for wheezing or shortness of breath. 18 g 2   benzonatate (TESSALON PERLES) 100 MG capsule Take 1 capsule (100 mg total) by mouth 3 (three) times daily as needed for cough. 30 capsule 1   traMADol (ULTRAM) 50 MG tablet Take by mouth.     cyanocobalamin (VITAMIN B12) injection 1,000 mcg      No facility-administered medications prior to visit.    Allergies  Allergen Reactions   Contrast Media [Iodinated Contrast Media] Hives, Itching and Rash   Omeprazole Itching   Shellfish Allergy Nausea And Vomiting   Shellfish-Derived Products Nausea And Vomiting    Review of Systems  Constitutional:  Negative for chills, fatigue and fever.  Respiratory:  Negative for cough and shortness of breath.   Cardiovascular:  Negative for chest pain.  Genitourinary:        No control of bladder  Musculoskeletal:  Positive for arthralgias (both hands). Negative for back pain and myalgias.       Objective:        05/31/2023    8:24 AM  05/22/2023    8:54 AM 02/26/2023    9:01 AM  Vitals with BMI  Height 5\' 2"  5\' 2"    Weight 142 lbs 143 lbs 6 oz   BMI 25.97 26.22   Systolic 130 138 191  Diastolic 74 72 78  Pulse 84 95     No data found.   Physical Exam Vitals reviewed.  Constitutional:      General: She is not in acute distress.    Appearance: Normal appearance.  HENT:     Head: Normocephalic.  Eyes:     Conjunctiva/sclera: Conjunctivae normal.  Neck:     Vascular: No carotid bruit.  Cardiovascular:     Rate and Rhythm: Normal rate and regular rhythm.     Heart sounds: Normal heart sounds.  Pulmonary:     Effort: Pulmonary effort is normal.     Breath sounds: Normal breath sounds. No wheezing.  Musculoskeletal:        General: Swelling (hands), tenderness (hands) and deformity (hands) present.  Neurological:     Mental Status: She is alert. Mental status is at baseline.  Psychiatric:        Mood and Affect: Mood normal.        Behavior: Behavior normal.     Health Maintenance Due  Topic Date Due   Zoster Vaccines- Shingrix (2 of 2) 10/12/2021    There are no preventive care reminders to display for this patient.   Lab Results  Component Value Date   TSH 8.980 (H) 05/22/2023   Lab Results  Component Value Date   WBC 9.5 05/22/2023   HGB 9.3 (L) 05/22/2023   HCT 28.4 (L) 05/22/2023   MCV 93 05/22/2023   PLT 372 05/22/2023   Lab Results  Component Value Date   NA 141 05/22/2023   K 5.4 (H) 05/22/2023   CO2 18 (L) 05/22/2023   GLUCOSE 104 (H) 05/22/2023  BUN 58 (H) 05/22/2023   CREATININE 2.79 (H) 05/22/2023   BILITOT 0.2 05/22/2023   ALKPHOS 132 (H) 05/22/2023   AST 14 05/22/2023   ALT 11 05/22/2023   PROT 6.4 05/22/2023   ALBUMIN 4.0 05/22/2023   CALCIUM 9.1 05/22/2023   ANIONGAP 9 08/13/2022   EGFR 17 (L) 05/22/2023   Lab Results  Component Value Date   CHOL 138 05/22/2023   Lab Results  Component Value Date   HDL 46 05/22/2023   Lab Results  Component Value Date    LDLCALC 74 05/22/2023   Lab Results  Component Value Date   TRIG 93 05/22/2023   Lab Results  Component Value Date   CHOLHDL 3.0 05/22/2023   Lab Results  Component Value Date   HGBA1C 5.6 04/04/2021       Assessment & Plan:  Pain in both hands Assessment & Plan: Bilateral hand pain with swelling and tenderness, possibly gout. No prior diagnosis of gout. Family history of arthritis. Pain improved with heat application. -Order uric acid level to evaluate for gout. -Recommend over-the-counter turmeric and glucosamine chondroitin for joint pain relief.  Orders: -     Uric acid  Chronic kidney disease, stage V (HCC) Assessment & Plan: Defer management to nephrology. Per their notes, patient is not interested in dialysis.   Anemia due to stage 4 chronic kidney disease (HCC) Assessment & Plan: Labs drawn Await labs/testing for assessment and recommendations   Orders: -     Comprehensive metabolic panel -     Iron, TIBC and Ferritin Panel  Impaired fasting glucose Assessment & Plan: Elevated glucose on previous labs. Proteinuria noted. -Order labs to recheck glucose level.   Orders: -     Hemoglobin A1c     No orders of the defined types were placed in this encounter.   Orders Placed This Encounter  Procedures   Hemoglobin A1c   Comprehensive metabolic panel   Uric Acid   Iron, TIBC and Ferritin Panel     Follow-up: Return for Keep appointment w Dr. Sedalia Muta in Feb..  An After Visit Summary was printed and given to the patient.  Total time spent on today's visit was 32 minutes, including both face-to-face time and nonface-to-face time personally spent on review of chart (labs and imaging), discussing labs and goals, discussing further work-up, treatment options, referrals to specialist if needed, reviewing outside records if pertinent, answering patient's questions, and coordinating care.    Lajuana Matte, FNP [facility] [phone]

## 2023-05-31 NOTE — Patient Instructions (Signed)
Glucosamine chondroitin with tumeric

## 2023-05-31 NOTE — Assessment & Plan Note (Signed)
Labs drawn Await labs/testing for assessment and recommendations.

## 2023-05-31 NOTE — Assessment & Plan Note (Signed)
Bilateral hand pain with swelling and tenderness, possibly gout. No prior diagnosis of gout. Family history of arthritis. Pain improved with heat application. -Order uric acid level to evaluate for gout. -Recommend over-the-counter turmeric and glucosamine chondroitin for joint pain relief.

## 2023-05-31 NOTE — Assessment & Plan Note (Signed)
Elevated glucose on previous labs. Proteinuria noted. -Order labs to recheck glucose level.

## 2023-05-31 NOTE — Assessment & Plan Note (Signed)
Defer management to nephrology. Per their notes, patient is not interested in dialysis.

## 2023-06-01 LAB — COMPREHENSIVE METABOLIC PANEL
ALT: 8 [IU]/L (ref 0–32)
AST: 12 [IU]/L (ref 0–40)
Albumin: 3.9 g/dL (ref 3.8–4.8)
Alkaline Phosphatase: 137 [IU]/L — ABNORMAL HIGH (ref 44–121)
BUN/Creatinine Ratio: 19 (ref 12–28)
BUN: 53 mg/dL — ABNORMAL HIGH (ref 8–27)
Bilirubin Total: <0.2 mg/dL (ref 0.0–1.2)
CO2: 17 mmol/L — ABNORMAL LOW (ref 20–29)
Calcium: 8.9 mg/dL (ref 8.7–10.3)
Chloride: 108 mmol/L — ABNORMAL HIGH (ref 96–106)
Creatinine, Ser: 2.82 mg/dL — ABNORMAL HIGH (ref 0.57–1.00)
Globulin, Total: 2.3 g/dL (ref 1.5–4.5)
Glucose: 90 mg/dL (ref 70–99)
Potassium: 5.1 mmol/L (ref 3.5–5.2)
Sodium: 140 mmol/L (ref 134–144)
Total Protein: 6.2 g/dL (ref 6.0–8.5)
eGFR: 16 mL/min/{1.73_m2} — ABNORMAL LOW (ref >59)

## 2023-06-01 LAB — IRON,TIBC AND FERRITIN PANEL
Ferritin: 207 ng/mL — ABNORMAL HIGH (ref 15–150)
Iron Saturation: 23 % (ref 15–55)
Iron: 55 ug/dL (ref 27–139)
Total Iron Binding Capacity: 237 ug/dL — ABNORMAL LOW (ref 250–450)
UIBC: 182 ug/dL (ref 118–369)

## 2023-06-01 LAB — HEMOGLOBIN A1C
Est. average glucose Bld gHb Est-mCnc: 105 mg/dL
Hgb A1c MFr Bld: 5.3 % (ref 4.8–5.6)

## 2023-06-01 LAB — URIC ACID: Uric Acid: 4.6 mg/dL (ref 3.1–7.9)

## 2023-06-04 ENCOUNTER — Other Ambulatory Visit: Payer: Self-pay | Admitting: Family Medicine

## 2023-06-04 DIAGNOSIS — E611 Iron deficiency: Secondary | ICD-10-CM

## 2023-06-04 MED ORDER — FERROUS SULFATE 325 (65 FE) MG PO TABS
325.0000 mg | ORAL_TABLET | Freq: Every day | ORAL | 0 refills | Status: DC
Start: 2023-06-04 — End: 2023-11-11

## 2023-06-11 ENCOUNTER — Other Ambulatory Visit: Payer: Self-pay | Admitting: Family Medicine

## 2023-06-11 ENCOUNTER — Telehealth: Payer: Self-pay

## 2023-06-11 ENCOUNTER — Other Ambulatory Visit: Payer: Self-pay

## 2023-06-11 DIAGNOSIS — M19041 Primary osteoarthritis, right hand: Secondary | ICD-10-CM

## 2023-06-11 MED ORDER — PREDNISONE 20 MG PO TABS: 20.0000 mg | ORAL_TABLET | Freq: Every day | ORAL | 0 refills | Status: DC

## 2023-06-11 NOTE — Telephone Encounter (Signed)
 Copied from CRM [id number]. Topic: Clinical - Medical Advice >> Jun 11, 2023 11:00 AM Montie POUR wrote: Reason for CRM: Alexandra [name] said the rheumatologist she was referred to does not have an appointment until May 2025. She is wanting Dr. Sherre to refer her another rheumatologist in [place], [place]. Please call [place] at [phone]

## 2023-06-11 NOTE — Telephone Encounter (Signed)
 Recommend xrays and medicine (prednisone 20 mg daily x 10 days.)  Recommend come back for some follow up labs.  Esr, crp, rheumatoid panel, ana.

## 2023-06-11 NOTE — Telephone Encounter (Signed)
Per Husband Pt was not home, left message for her to give me a call back.

## 2023-06-11 NOTE — Telephone Encounter (Signed)
 Patient Made Aware, Verbalized Understanding. Labs are future, xray are ordered and printed. Patient will come in Thursday for labs and pick up x-ray order and medication sent to requested pharmacy.

## 2023-06-11 NOTE — Telephone Encounter (Signed)
Called patient, she stated her hands are hurting very bad, and the cream she was recommended to try is not helping, wanted to know if anything else can be done until she see the rheumatologist. Please advise

## 2023-06-11 NOTE — Telephone Encounter (Signed)
 Copied from CRM [id number]. Topic: Referral - Question >> Jun 11, 2023  7:54 AM Fonda Kinder J wrote: Reason for CRM: Pt is requesting a callback from DR.[facility], she states she has questions for a referral for her Arthritis

## 2023-06-13 ENCOUNTER — Encounter: Payer: Self-pay | Admitting: Family Medicine

## 2023-06-13 ENCOUNTER — Ambulatory Visit: Payer: Medicare Other

## 2023-06-13 DIAGNOSIS — M19041 Primary osteoarthritis, right hand: Secondary | ICD-10-CM

## 2023-06-15 LAB — SEDIMENTATION RATE: Sed Rate: 23 mm/h (ref 0–40)

## 2023-06-15 LAB — RHEUMATOID FACTOR: Rheumatoid fact SerPl-aCnc: 11.7 [IU]/mL (ref <14.0)

## 2023-06-15 LAB — C-REACTIVE PROTEIN: CRP: 1 mg/L (ref 0–10)

## 2023-06-15 LAB — ANA W/REFLEX: ANA Titer 1: NEGATIVE

## 2023-06-18 ENCOUNTER — Ambulatory Visit: Payer: Self-pay

## 2023-06-18 ENCOUNTER — Other Ambulatory Visit: Payer: Self-pay | Admitting: Family Medicine

## 2023-06-18 NOTE — Telephone Encounter (Signed)
 Boyd Kerbs from [facility] calling regarding a referral for the patient and is needing to let the clinic know that the patient does not want the deformans.

## 2023-06-21 ENCOUNTER — Other Ambulatory Visit: Payer: Self-pay | Admitting: Family Medicine

## 2023-06-21 DIAGNOSIS — R112 Nausea with vomiting, unspecified: Secondary | ICD-10-CM

## 2023-07-03 ENCOUNTER — Ambulatory Visit: Payer: Self-pay | Admitting: Licensed Clinical Social Worker

## 2023-07-03 NOTE — Patient Instructions (Signed)
Visit Information  Thank you for taking time to visit with me today. Please don't hesitate to contact me if I can be of assistance to you.   Following are the goals we discussed today:   Goals Addressed             This Visit's Progress    patient trying to manage her medical needs and manage needs of her spouse       Interventions:  LCSW spoke via phone today with client about her needs and the needs of her spouse Discussed family support. She said she has some support from her daughter and son. They both work during the week.  She said her daughter often helps client obtain needed grocery items for client Discussed medication procurement.  Provided counseling support for client Discussed ambulation of client. She uses a rollator walker to help her walk. She has to take periodic rest breaks when walking. She said use of rollator is helpful to her  Reviewed edema issues. She said she  has had some improvement in edema issues Discussed sleeping issues.  She said sometimes she has difficulty sleeping Discussed transport needs. She said she drives her car to and from appointments as needed Discussed client support with PCP.  Discussed client procuring incontinence supplies. She said these supplies are getting costly to client. She said she buys these supplies at local [facility] store Discussed pain issues of client. She said she has pain in her hands . She has difficulty sometimes holding a glass or utensils. Encouraged client to talk more with PCP about issues with pain in her hands Discussed vision of client. Client wears glasses to help with vision Client said when she uses rollator she sometimes has to take periodic rest breaks She said she has appointment with a Kidney Specialist later this month Encouraged client to call LCSW as needed for SW support at [phone] [facility] client for phone call with LCSW today          Our next appointment is by telephone on 09/03/23 at  11:00 AM   Please call the care guide team at [phone] if you need to cancel or reschedule your appointment.   If you are experiencing a Mental Health or Behavioral Health Crisis or need someone to talk to, please go to [facility] [address], [place] [phone])   The patient verbalized understanding of instructions, educational materials, and care plan provided today and DECLINED offer to receive copy of patient instructions, educational materials, and care plan.   The patient has been provided with contact information for the care management team and has been advised to call with any health related questions or concerns.    Lorna Few  MSW, LCSW [place] Licensed Clinical Social Worker Direct Dial:  [phone] Fax:  [phone] Website:  [email or web address]

## 2023-07-03 NOTE — Patient Outreach (Signed)
Care Coordination   Follow Up Visit Note   07/03/2023 Name: Alexandra [name] MRN: [id number] DOB: [birth year]  RASHAAN HORSFIELD is a 83 y.o. year old female who sees [facility], Fritzi Mandes, MD for primary care. I spoke with  [name] by phone today.  What matters to the patients health and wellness today? patient trying to manage her medical needs and manage needs of her spouse     Goals Addressed             This Visit's Progress    patient trying to manage her medical needs and manage needs of her spouse       Interventions:  LCSW spoke via phone today with client about her needs and the needs of her spouse Discussed family support. She said she has some support from her daughter and son. They both work during the week.  She said her daughter often helps client obtain needed grocery items for client Discussed medication procurement.  Provided counseling support for client Discussed ambulation of client. She uses a rollator walker to help her walk. She has to take periodic rest breaks when walking. She said use of rollator is helpful to her  Reviewed edema issues. She said she  has had some improvement in edema issues Discussed sleeping issues.  She said sometimes she has difficulty sleeping Discussed transport needs. She said she drives her car to and from appointments as needed Discussed client support with PCP.  Discussed client procuring incontinence supplies. She said these supplies are getting costly to client. She said she buys these supplies at local [facility] store Discussed pain issues of client. She said she has pain in her hands . She has difficulty sometimes holding a glass or utensils. Encouraged client to talk more with PCP about issues with pain in her hands Discussed vision of client. Client wears glasses to help with vision Client said when she uses rollator she sometimes has to take periodic rest breaks She said she has appointment with a Kidney Specialist later  this month Encouraged client to call LCSW as needed for SW support at [phone] [facility] client for phone call with LCSW today          SDOH assessments and interventions completed:  Yes  SDOH Interventions Today    Flowsheet Row Most Recent Value  SDOH Interventions   Physical Activity Interventions Other (Comments)  [mobility challenges. She uses a rollator to help her walk]  Stress Interventions Provide Counseling  [client has stress in managing her medical needs. She has stress in managing needs of her spouse]        Care Coordination Interventions:  Yes, provided   Interventions Today    Flowsheet Row Most Recent Value  Chronic Disease   Chronic disease during today's visit Other  [spoke with client about client needs]  General Interventions   General Interventions Discussed/Reviewed General Interventions Discussed, Community Resources  Education Interventions   Education Provided Provided Education  Provided Verbal Education On [employer]  Mental Health Interventions   Mental Health Discussed/Reviewed Coping Strategies  [using coping skills to manage stress issues]  Nutrition Interventions   Nutrition Discussed/Reviewed Nutrition Discussed  [client cooks meals for her and for her spouse]  Pharmacy Interventions   Pharmacy Dicussed/Reviewed Pharmacy Topics Discussed  Safety Interventions   Safety Discussed/Reviewed Fall Risk        Follow up plan: Follow up call scheduled for 09/03/23 at 11:00 AM    Encounter Outcome:  Patient  Visit Completed    Lorna Few  MSW, LCSW [place] Licensed Clinical Social Worker Direct Dial:  [phone] Fax:  [phone] Website:  [email or web address]

## 2023-07-11 ENCOUNTER — Ambulatory Visit (INDEPENDENT_AMBULATORY_CARE_PROVIDER_SITE_OTHER): Payer: Medicare Other | Admitting: Family Medicine

## 2023-07-11 ENCOUNTER — Other Ambulatory Visit: Payer: Self-pay | Admitting: Family Medicine

## 2023-07-11 ENCOUNTER — Encounter: Payer: Self-pay | Admitting: Family Medicine

## 2023-07-11 ENCOUNTER — Ambulatory Visit: Payer: Self-pay | Admitting: Family Medicine

## 2023-07-11 VITALS — BP 158/72 | HR 94 | Temp 97.3°F | Ht 62.0 in | Wt 145.0 lb

## 2023-07-11 DIAGNOSIS — M19041 Primary osteoarthritis, right hand: Secondary | ICD-10-CM

## 2023-07-11 DIAGNOSIS — M19042 Primary osteoarthritis, left hand: Secondary | ICD-10-CM | POA: Diagnosis not present

## 2023-07-11 MED ORDER — PREDNISONE 20 MG PO TABS: 20.0000 mg | ORAL_TABLET | Freq: Every day | ORAL | 0 refills | Status: DC

## 2023-07-11 MED ORDER — PREDNISONE 10 MG PO TABS: 10.0000 mg | ORAL_TABLET | Freq: Every day | ORAL | 0 refills | Status: AC

## 2023-07-11 NOTE — Progress Notes (Signed)
Acute Office Visit  Subjective:    Patient ID: Alexandra [name], female    DOB: 1940/07/05, 83 y.o.   MRN: [id number]  Chief Complaint  Patient presents with   Arthitis in hands    Discussed the use of AI scribe software for clinical note transcription with the patient, who gave verbal consent to proceed.   HPI: Patient is in today for pain in both hands.  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 83 yo   BACK SURGERY  2016   lower L 4 to L 5   bladder tack and uterus lift  10/10/2015   CHOLECYSTECTOMY     summer of 2022   CYSTOSCOPY WITH RETROGRADE PYELOGRAM, URETEROSCOPY AND STENT PLACEMENT Right 08/04/2022   Procedure: CYSTOSCOPY WITH RIGHT URETERAL STENT EXCHANGE;  Surgeon: Despina Arias, MD;  Location: [facility] OR;  Service: Urology;  Laterality: Right;   IR RADIOLOGIST EVAL & MGMT  12/03/2018   IR RADIOLOGIST EVAL & MGMT  12/31/2018   IR REMOVAL TUN ACCESS W/ PORT W/O FL MOD SED  08/08/2022   PARATHYROIDECTOMY Left 07/02/2016   Procedure: LEFT INFERIOR PARATHYROIDECTOMY;  Surgeon: Darnell Level, MD;  Location: [facility] ORS;  Service: General;  Laterality: Left;   SHOULDER OPEN ROTATOR CUFF REPAIR  12/2019   TEE WITHOUT CARDIOVERSION N/A 08/10/2022   Procedure: TRANSESOPHAGEAL ECHOCARDIOGRAM (TEE);  Surgeon: Chrystie Nose, MD;  Location: [facility] ENDOSCOPY;  Service: Cardiovascular;  Laterality: N/A;   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    Family History  Problem Relation Age of Onset   Emphysema Mother     Hyperlipidemia Sister    Hypertension Sister    COPD Sister    Hyperlipidemia Sister    Hypertension Sister    Lymphoma Sister    Obesity Brother    Hyperlipidemia Brother    Hypertension Brother    Breast cancer Neg Hx     Social History   Socioeconomic History   Marital status: Married    Spouse name: Molly Maduro   Number of children: 2   Years of education: 11   Highest education level: 11th grade  Occupational History   Occupation: Retired  Tobacco Use   Smoking status: Former    Current packs/day: 0.00    Average packs/day: 1 pack/day for 50.0 years (50.0 ttl pk-yrs)    Types: Cigarettes    Start date: 06/12/1955    Quit date: 06/11/2005    Years since quitting: 18.0   Smokeless tobacco: Never  Vaping Use   Vaping status: Never Used  Substance and Sexual Activity   Alcohol use: No   Drug use: No   Sexual activity: Yes    Partners: Male  Other Topics Concern   Not on file  Social History Narrative   Not on file   Social Drivers of Health   Financial Resource Strain: Medium Risk (04/02/2023)   Overall Financial Resource Strain (CARDIA)    Difficulty of Paying Living Expenses: Somewhat hard  Food  Insecurity: No Food Insecurity (02/26/2023)   Hunger Vital Sign    Worried About Running Out of Food in the Last Year: Never true    Ran Out of Food in the Last Year: Never true  Transportation Needs: No Transportation Needs (02/26/2023)   PRAPARE - Administrator, Civil Service (Medical): No    Lack of Transportation (Non-Medical): No  Physical Activity: Inactive (07/03/2023)   Exercise Vital Sign    Days of Exercise per Week: 0 days    Minutes of Exercise per Session: 0 min  Stress: Stress Concern Present (07/03/2023)   [employer] of Occupational Health - Occupational Stress Questionnaire    Feeling of Stress : To some extent  Social Connections: Moderately Integrated (02/26/2023)   Social Connection and Isolation Panel [NHANES]    Frequency of  Communication with Friends and Family: More than three times a week    Frequency of Social Gatherings with Friends and Family: More than three times a week    Attends Religious Services: More than 4 times per year    Active Member of [employer] or Organizations: No    Attends Banker Meetings: Never    Marital Status: Married  Catering manager Violence: Not At Risk (02/26/2023)   Humiliation, Afraid, Rape, and Kick questionnaire    Fear of Current or Ex-Partner: No    Emotionally Abused: No    Physically Abused: No    Sexually Abused: No    Outpatient Medications Prior to Visit  Medication Sig Dispense Refill   amLODipine (NORVASC) 10 MG tablet TAKE 1 TABLET(10 MG) BY MOUTH DAILY 90 tablet 1   atorvastatin (LIPITOR) 40 MG tablet Take 1 tablet (40 mg total) by mouth daily. Per ccm 90 tablet 1   Budeson-Glycopyrrol-Formoterol (BREZTRI AEROSPHERE) 160-9-4.8 MCG/ACT AERO Inhale 2 puffs into the lungs in the morning and at bedtime. 10.7 g 4   Capsaicin-Menthol-Methyl Sal (CAPSAICIN-METHYL SAL-MENTHOL) 0.025-1-12 % CREA Apply 1 application  topically 2 (two) times daily as needed. 56.6 g 0   Cholecalciferol (VITAMIN D) 125 MCG (5000 UT) CAPS Take 1 capsule by mouth daily.     ferrous sulfate 325 (65 FE) MG tablet Take 1 tablet (325 mg total) by mouth daily. 90 tablet 0   levalbuterol (XOPENEX) 1.25 MG/3ML nebulizer solution Take 1.25 mg by nebulization 2 (two) times daily.     levothyroxine (SYNTHROID) 100 MCG tablet Take 1 tablet (100 mcg total) by mouth daily. 90 tablet 0   melatonin 1 MG TABS tablet Take 1 tablet (1 mg total) by mouth at bedtime. 30 tablet 1   nitroGLYCERIN (NITROSTAT) 0.4 MG SL tablet Place 1 tablet (0.4 mg total) under the tongue every 5 (five) minutes as needed for chest pain. 30 tablet 3   pantoprazole (PROTONIX) 40 MG tablet TAKE 1 TABLET(40 MG) BY MOUTH DAILY 90 tablet 1   rOPINIRole (REQUIP) 2 MG tablet Take 1 tablet (2 mg total) by mouth 2 (two) times daily. 60  tablet 2   torsemide (DEMADEX) 20 MG tablet Take 1 tablet (20 mg total) by mouth daily as needed (edema). 30 tablet 2   VENTOLIN HFA 108 (90 Base) MCG/ACT inhaler Inhale 1-2 puffs into the lungs every 6 (six) hours as needed for wheezing or shortness of breath. 18 g 2   cyanocobalamin (VITAMIN B12) 1000 MCG/ML injection Inject 1 mL (1,000 mcg total) into the muscle every 14 (fourteen) days. (Patient not taking: Reported on 07/11/2023) 4 mL 1   sodium zirconium  cyclosilicate (LOKELMA) 5 g packet Take 5 g by mouth daily. (Patient not taking: Reported on 07/11/2023)     guaiFENesin-dextromethorphan (ROBITUSSIN DM) 100-10 MG/5ML syrup Take 5 mLs by mouth every 4 (four) hours as needed for cough. 118 mL 0   predniSONE (DELTASONE) 20 MG tablet Take 1 tablet (20 mg total) by mouth daily with breakfast. 10 tablet 0   No facility-administered medications prior to visit.    Allergies  Allergen Reactions   Contrast Media [Iodinated Contrast Media] Hives, Itching and Rash   Omeprazole Itching   Shellfish Allergy Nausea And Vomiting   Shellfish-Derived Products Nausea And Vomiting    Review of Systems  Constitutional:  Negative for appetite change, fatigue and fever.  HENT:  Negative for congestion, ear pain, sinus pressure and sore throat.   Respiratory:  Negative for cough, chest tightness, shortness of breath and wheezing.   Cardiovascular:  Negative for chest pain and palpitations.  Gastrointestinal:  Negative for abdominal pain, constipation, diarrhea, nausea and vomiting.  Genitourinary:  Negative for dysuria and hematuria.  Musculoskeletal:  Positive for arthralgias (Bilateral hands to elbows). Negative for back pain, joint swelling and myalgias.  Skin:  Negative for rash.  Neurological:  Negative for dizziness, weakness and headaches.  Psychiatric/Behavioral:  Negative for dysphoric mood. The patient is not nervous/anxious.        Objective:        07/11/2023    1:16 PM 05/31/2023     8:24 AM 05/22/2023    8:54 AM  Vitals with BMI  Height 5\' 2"  5\' 2"  5\' 2"   Weight 145 lbs 142 lbs 143 lbs 6 oz  BMI 26.51 25.97 26.22  Systolic 158 130 161  Diastolic 72 74 72  Pulse 94 84 95    Orthostatic VS for the past 72 hrs (Last 3 readings):  Patient Position BP Location  07/11/23 1316 Sitting Left Arm     Physical Exam Constitutional:      General: She is not in acute distress.    Appearance: Normal appearance.  Eyes:     Conjunctiva/sclera: Conjunctivae normal.  Cardiovascular:     Rate and Rhythm: Normal rate and regular rhythm.     Heart sounds: Normal heart sounds.  Pulmonary:     Effort: Pulmonary effort is normal.     Breath sounds: Normal breath sounds.  Musculoskeletal:     Right wrist: Swelling, deformity and tenderness present. Decreased range of motion.     Left wrist: Swelling, deformity and tenderness present. Decreased range of motion.     Comments: Positive Phalen's Sign and Tinel's Sign   Skin:    General: Skin is warm.  Neurological:     Mental Status: She is alert. Mental status is at baseline.  Psychiatric:        Mood and Affect: Mood normal.        Behavior: Behavior normal.     Health Maintenance Due  Topic Date Due   Zoster Vaccines- Shingrix (2 of 2) 10/12/2021    There are no preventive care reminders to display for this patient.   Lab Results  Component Value Date   TSH 8.980 (H) 05/22/2023   Lab Results  Component Value Date   WBC 9.5 05/22/2023   HGB 9.3 (L) 05/22/2023   HCT 28.4 (L) 05/22/2023   MCV 93 05/22/2023   PLT 372 05/22/2023   Lab Results  Component Value Date   NA 140 05/31/2023   K 5.1 05/31/2023   CO2  17 (L) 05/31/2023   GLUCOSE 90 05/31/2023   BUN 53 (H) 05/31/2023   CREATININE 2.82 (H) 05/31/2023   BILITOT <0.2 05/31/2023   ALKPHOS 137 (H) 05/31/2023   AST 12 05/31/2023   ALT 8 05/31/2023   PROT 6.2 05/31/2023   ALBUMIN 3.9 05/31/2023   CALCIUM 8.9 05/31/2023   ANIONGAP 9 08/13/2022   EGFR  16 (L) 05/31/2023   Lab Results  Component Value Date   CHOL 138 05/22/2023   Lab Results  Component Value Date   HDL 46 05/22/2023   Lab Results  Component Value Date   LDLCALC 74 05/22/2023   Lab Results  Component Value Date   TRIG 93 05/22/2023   Lab Results  Component Value Date   CHOLHDL 3.0 05/22/2023   Lab Results  Component Value Date   HGBA1C 5.3 05/31/2023       Assessment & Plan:  Arthritis of both hands Assessment & Plan: Severe pain in hands for over a month, affecting sleep and daily activities. Previous trials of Tylenol and various creams have not provided relief. Kidney failure keeps patient from using most NSAID's or other pain/inflammation medication -Trial of topical capsaicin cream do not help -referral to rheumatology - scheduled for May -referral to orthopedic surgeon -Deberah Castle ordered -prednisone 10 mg for 14 days ordered today  -carpel tunnel brace and turmeric recommended to help with the pain - labs drawn today, Await labs/testing for assessment and recommendations    Orders: -     Rheumatoid factor -     CYCLIC CITRUL PEPTIDE ANTIBODY, IGG/IGA -     predniSONE; Take 1 tablet (10 mg total) by mouth daily with breakfast for 14 days.  Dispense: 14 tablet; Refill: 0 -     Ambulatory referral to Orthopedic Surgery     Meds ordered this encounter  Medications   predniSONE (DELTASONE) 10 MG tablet    Sig: Take 1 tablet (10 mg total) by mouth daily with breakfast for 14 days.    Dispense:  14 tablet    Refill:  0    Orders Placed This Encounter  Procedures   Rheumatoid factor   CYCLIC CITRUL PEPTIDE ANTIBODY, IGG/IGA   Ambulatory referral to Orthopedic Surgery     Follow-up: Return for keep appiointment in February.  An After Visit Summary was printed and given to the patient.  Total time spent on today's visit was 31 minutes, including both face-to-face time and nonface-to-face time personally spent on review of chart (labs and  imaging), discussing labs and goals, discussing further work-up, treatment options, referrals to specialist if needed, reviewing outside records if pertinent, answering patient's questions, and coordinating care.    Lajuana Matte, FNP [facility] [phone]

## 2023-07-11 NOTE — Assessment & Plan Note (Addendum)
Severe pain in hands for over a month, affecting sleep and daily activities. Previous trials of Tylenol and various creams have not provided relief. Kidney failure keeps patient from using most NSAID's or other pain/inflammation medication -Trial of topical capsaicin cream do not help -referral to rheumatology - scheduled for May -referral to orthopedic surgeon -Alexandra [name] ordered -prednisone 10 mg for 14 days ordered today  -carpel tunnel brace and turmeric recommended to help with the pain - labs drawn today, Await labs/testing for assessment and recommendations

## 2023-07-11 NOTE — Telephone Encounter (Signed)
Copied from CRM [id number]. Topic: Clinical - Medication Refill >> Jul 11, 2023 11:46 AM Bobbye Morton wrote: Most Recent Primary Care Visit:  [facility]  [facility]  Visit Type: CLINICAL SUPPORT  Date: 06/13/2023  Medication: predniSONE (DELTASONE) 20 MG tablet  Has the patient contacted their pharmacy? No (Agent: If no, request that the patient contact the pharmacy for the refill. If patient does not wish to contact the pharmacy document the reason why and proceed with request.) (Agent: If yes, when and what did the pharmacy advise?)  Is this the correct pharmacy for this prescription? Yes If no, delete pharmacy and type the correct one.  This is the patient's preferred pharmacy:  [facility] #[id number] [place] - [address] [place] [address] Phone: [phone] Fax: [phone]    Has the prescription been filled recently? Yes, 06/11/2023 - pt received 7 day supply  Is the patient out of the medication? Yes  Has the patient been seen for an appointment in the last year OR does the patient have an upcoming appointment? Yes  Can we respond through MyChart? No  Agent: Please be advised that Rx refills may take up to 3 business days. We ask that you follow-up with your pharmacy.    Chief Complaint: Hand Pain, bilateral x 2 weeks Symptoms: joint swelling Frequency: constant Pertinent Negatives: Patient denies fever Disposition: [] ED /[] Urgent Care (no appt availability in office) / [x] Appointment(In office/virtual)/ []  [facility]/ [] Home Care/ [] Refused Recommended Disposition /[] [facility]/ []  Follow-up with PCP Additional Notes: Pt with history of arthritis, treated with prednisone back in December. States that her pain got better, but started back up when she finished the medication. Pt requesting either more prednisone or new medication to treat pain long term. Appt scheduled  for today at 1:40pm  Reason for Disposition  [1] MODERATE pain (e.g., interferes with normal activities) AND [2] present > 3 days  Answer Assessment - Initial Assessment Questions 1. ONSET: "When did the pain start?"     Started hurting 2 weeks ago  2. LOCATION: "Where is the pain located?"     Both hands  3. PAIN: "How bad is the pain?" (Scale 1-10; or mild, moderate, severe)   - MILD (1-3): doesn't interfere with normal activities   - MODERATE (4-7): interferes with normal activities (e.g., work or school) or awakens from sleep   - SEVERE (8-10): excruciating pain, unable to use hand at all     Severe pain  4. WORK OR EXERCISE: "Has there been any recent work or exercise that involved this part (i.e., hand or wrist) of the body?"     No  5. CAUSE: "What do you think is causing the pain?"     Unsure of cause  6. AGGRAVATING FACTORS: "What makes the pain worse?" (e.g., using computer)     No aggravating  7. OTHER SYMPTOMS: "Do you have any other symptoms?" (e.g., neck pain, swelling, rash, numbness, fever)     Swelling and number at finger tips  8. PREGNANCY: "Is there any chance you are pregnant?" "When was your last menstrual period?"     No  Protocols used: Hand and Wrist Pain-A-AH

## 2023-07-11 NOTE — Patient Instructions (Addendum)
-   Carpel tunnel brace - Turmeric supplement - Referral -  [facility] [place] Address: [address] [place], [place] [address]

## 2023-07-11 NOTE — Telephone Encounter (Signed)
Copied from CRM [id number]. Topic: Clinical - Medication Refill >> Jul 11, 2023 11:46 AM Bobbye Morton wrote: Most Recent Primary Care Visit:  [facility]  [facility]  Visit Type: CLINICAL SUPPORT  Date: 06/13/2023  Medication: predniSONE (DELTASONE) 20 MG tablet  Has the patient contacted their pharmacy? No (Agent: If no, request that the patient contact the pharmacy for the refill. If patient does not wish to contact the pharmacy document the reason why and proceed with request.) (Agent: If yes, when and what did the pharmacy advise?)  Is this the correct pharmacy for this prescription? Yes If no, delete pharmacy and type the correct one.  This is the patient's preferred pharmacy:  [facility] #[id number] [place] - [address] [place] [address] Phone: [phone] Fax: [phone]    Has the prescription been filled recently? Yes, 06/11/2023 - pt received 7 day supply  Is the patient out of the medication? Yes  Has the patient been seen for an appointment in the last year OR does the patient have an upcoming appointment? Yes  Can we respond through MyChart? No  Agent: Please be advised that Rx refills may take up to 3 business days. We ask that you follow-up with your pharmacy.

## 2023-07-13 LAB — RHEUMATOID FACTOR: Rheumatoid fact SerPl-aCnc: 10.8 [IU]/mL (ref <14.0)

## 2023-07-13 LAB — CYCLIC CITRUL PEPTIDE ANTIBODY, IGG/IGA: Cyclic Citrullin Peptide Ab: 4 U (ref 0–19)

## 2023-07-16 ENCOUNTER — Other Ambulatory Visit: Payer: Medicare Other

## 2023-07-16 DIAGNOSIS — E039 Hypothyroidism, unspecified: Secondary | ICD-10-CM

## 2023-07-17 LAB — TSH: TSH: 4.35 u[IU]/mL (ref 0.450–4.500)

## 2023-07-21 ENCOUNTER — Other Ambulatory Visit: Payer: Self-pay | Admitting: Family Medicine

## 2023-07-21 DIAGNOSIS — C679 Malignant neoplasm of bladder, unspecified: Secondary | ICD-10-CM

## 2023-07-22 ENCOUNTER — Telehealth: Payer: Self-pay

## 2023-07-22 ENCOUNTER — Other Ambulatory Visit: Payer: Self-pay | Admitting: Family Medicine

## 2023-07-22 ENCOUNTER — Other Ambulatory Visit: Payer: Self-pay

## 2023-07-22 DIAGNOSIS — G2581 Restless legs syndrome: Secondary | ICD-10-CM

## 2023-07-22 DIAGNOSIS — R071 Chest pain on breathing: Secondary | ICD-10-CM

## 2023-07-22 MED ORDER — NITROGLYCERIN 0.4 MG SL SUBL: 0.4000 mg | SUBLINGUAL_TABLET | SUBLINGUAL | 3 refills | Status: DC | PRN

## 2023-07-23 NOTE — Transitions of Care (Post Inpatient/ED Visit) (Signed)
07/23/2023  Name: Alexandra [name] MRN: [id number] DOB: 1941-05-30  Today's TOC FU Call Status: Today's TOC FU Call Status:: Successful TOC FU Call Completed TOC FU Call Complete Date: 07/22/23 Patient's Name and Date of Birth confirmed.  Transition Care Management Follow-up Telephone Call Date of Discharge: 07/18/23 (Notified/Put on report 07/22/23) Discharge Facility: Other ([facility]) Name of Other ([facility]) Discharge Facility: [facility] Type of Discharge: Inpatient Admission Primary Inpatient Discharge Diagnosis:: Hydronephrosis How have you been since you were released from the hospital?: Better Any questions or concerns?: No  Items Reviewed: Did you receive and understand the discharge instructions provided?: Yes Medications obtained,verified, and reconciled?: Yes (Medications Reviewed) Any new allergies since your discharge?: No Dietary orders reviewed?: No Do you have support at home?: Yes People in Home: child(ren), adult Name of Support/Comfort Primary Source: Daughter and Son assist  Medications Reviewed Today: Medications Reviewed Today     Reviewed by Jodelle Gross, RN (Case Manager) on 07/22/23 at 1656  Med List Status: <None>   Medication Order Taking? Sig Documenting Provider Last Dose Status Informant  amLODipine (NORVASC) 10 MG tablet [id number] Yes TAKE 1 TABLET(10 MG) BY MOUTH DAILY Cox, Kirsten, MD Taking Active   atorvastatin (LIPITOR) 40 MG tablet [id number] Yes Take 1 tablet (40 mg total) by mouth daily. Per ccm Cox, Fritzi Mandes, MD Taking Active   Budeson-Glycopyrrol-Formoterol (BREZTRI AEROSPHERE) 160-9-4.8 MCG/ACT AERO [id number] Yes Inhale 2 puffs into the lungs in the morning and at bedtime. Blane Ohara, MD Taking Active Self           Med Note Jenne Campus Mar 04, 2023  8:59 AM) Via PAP as of 03/04/23 review    Capsaicin-Menthol-Methyl Sal (CAPSAICIN-METHYL SAL-MENTHOL) 0.025-1-12 % CREA [id number] Yes Apply 1 application   topically 2 (two) times daily as needed. Renne Crigler, FNP Taking Active   Cholecalciferol (VITAMIN D) 125 MCG (5000 UT) CAPS [id number] Yes Take 1 capsule by mouth daily. [provider] Taking Active Self  cyanocobalamin (VITAMIN B12) 1000 MCG/ML injection [id number] No Inject 1 mL (1,000 mcg total) into the muscle every 14 (fourteen) days.  Patient not taking: Reported on 07/22/2023   CoxFritzi Mandes, MD Not Taking Active   ferrous sulfate 325 (65 FE) MG tablet [id number] Yes Take 1 tablet (325 mg total) by mouth daily. Renne Crigler, FNP Taking Active   levalbuterol Pauline Aus) 1.25 MG/3ML nebulizer solution [id number] No Take 1.25 mg by nebulization 2 (two) times daily.  Patient not taking: Reported on 07/22/2023   [provider] Not Taking Active Self           Med Note Michaela Corner   Tue Aug 14, 2022  2:46 PM) 08/14/22: Patient reports not taking due to cost  levofloxacin (LEVAQUIN) 250 MG tablet [id number] Yes Take 250 mg by mouth daily. [provider] Taking Active            Med Note Electa Sniff [facility]   Mon Jul 22, 2023  4:56 PM) Patient completed course of Abx  levothyroxine (SYNTHROID) 100 MCG tablet [id number] Yes Take 1 tablet (100 mcg total) by mouth daily. Renne Crigler, FNP Taking Active   melatonin 1 MG TABS tablet [id number] Yes Take 1 tablet (1 mg total) by mouth at bedtime. Langley Gauss, PA Taking Active   nitroGLYCERIN (NITROSTAT) 0.4 MG SL tablet [id number] Yes Place 1 tablet (0.4 mg total) under the tongue every 5 (five) minutes as needed for chest pain. Lurline Del, FNP Taking  Active Self  pantoprazole (PROTONIX) 40 MG tablet [id number] Yes TAKE 1 TABLET(40 MG) BY MOUTH DAILY Cox, Kirsten, MD Taking Active   predniSONE (DELTASONE) 10 MG tablet [id number] Yes Take 1 tablet (10 mg total) by mouth daily with breakfast for 14 days. Renne Crigler, FNP Taking Active   predniSONE (DELTASONE) 20 MG tablet [id number] Yes Take 1 tablet (20 mg total) by mouth daily  with breakfast. Cox, Kirsten, MD Taking Active   rOPINIRole (REQUIP) 2 MG tablet [id number] Yes TAKE 1 TABLET(2 MG) BY MOUTH TWICE DAILY Cox, Kirsten, MD Taking Active   sodium zirconium cyclosilicate (LOKELMA) 5 g packet [id number] No Take 5 g by mouth daily.  Patient not taking: Reported on 07/22/2023   [provider] Not Taking Active            Med Note Jenne Campus Mar 04, 2023  8:59 AM) Via PAP as of 03/04/23 review  torsemide (DEMADEX) 20 MG tablet [id number] Yes Take 1 tablet (20 mg total) by mouth daily as needed (edema). Cox, Kirsten, MD Taking Active   VENTOLIN HFA 108 [phone] Base) MCG/ACT inhaler [id number] Yes Inhale 1-2 puffs into the lungs every 6 (six) hours as needed for wheezing or shortness of breath. Cox, Kirsten, MD Taking Active Self           Med Note Clearance Coots, Janace Litten   Tue Jan 08, 2023  3:05 PM)              Home Care and Equipment/Supplies: Were Home Health Services Ordered?: No Any new equipment or medical supplies ordered?: No  Functional Questionnaire: Do you need assistance with bathing/showering or dressing?: No Do you need assistance with meal preparation?: No Do you need assistance with eating?: No Do you have difficulty maintaining continence: Yes (Patient concerned about the cost of incontinence supplies) Do you need assistance with getting out of bed/getting out of a chair/moving?: No Do you have difficulty managing or taking your medications?: No  Follow up appointments reviewed: PCP Follow-up appointment confirmed?: No (Patient to call for follow up) MD Provider Line Number:[phone] Given: No (Patient to call for follow up) Specialist Hospital Follow-up appointment confirmed?: No  SDOH Interventions Today    Flowsheet Row Most Recent Value  SDOH Interventions   Food Insecurity Interventions Intervention Not Indicated  Housing Interventions Intervention Not Indicated  Transportation Interventions Intervention Not  Indicated  Utilities Interventions Intervention Not Indicated       Goals Addressed             This Visit's Progress    30 Day TOC Program       Current Barriers:  Expense of incontinent supplies Chronic Disease Management support and education needs 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   Interventions: Evaluation of current treatment plan related to  self management and patient's adherence to plan as established by provider  Transitions of Care:  New goal. Doctor Visits  - discussed the importance of doctor visits  Patient Goals/Self-Care Activities: Participate in Transition of Care Program/Attend [facility] scheduled calls Notify RN Care Manager of [facility] call rescheduling needs Take all medications as prescribed Attend all scheduled provider appointments Call pharmacy for medication refills 3-7 days in  advance of running out of medications Perform all self care activities independently  Call provider office for new concerns or questions  Work with the social worker to address care coordination needs and will continue to work with the clinical team to address health care and disease management related needs Patient is currently working with Lorna Few LCSW Follow Up Plan:  Telephone follow up appointment with care management team member scheduled for:  07/31/23 at 2:00PM with [facility] RNCM          Jodelle Gross RN, BSN, CCM [place]  [facility] Manager Population Health Direct Dial: [phone]  Fax: [phone]

## 2023-07-31 ENCOUNTER — Other Ambulatory Visit: Payer: Self-pay

## 2023-07-31 NOTE — Patient Outreach (Signed)
Care Management  Transitions of Care Program Transitions of Care Post-discharge week 2   07/31/2023 Name: Alexandra [name] MRN: [id number] DOB: 10/31/1940  Subjective: Alexandra [name] is a 83 y.o. year old female who is a primary care patient of Cox, Kirsten, MD. The Care Management team Engaged with patient Engaged with patient by telephone to assess and address transitions of care needs.   Consent to Services:  Patient was given information about care management services, agreed to services, and gave verbal consent to participate.   Assessment:   Patient states she is doing well. Hospital PCP follow up discussed and patient states she will call PCP office to schedule. Reports urinary incontinence and was given phone number [phone] for [employer] [place] and advised to contact [employer] to see if she is able to obtain adult briefs with cost savings. Patient denied any other issues at this time. Agreed to continued weekly follow up.         SDOH Interventions    Flowsheet Row Telephone from 07/22/2023 in [place] POPULATION HEALTH DEPARTMENT Care Coordination from 07/03/2023 in [facility] Care Coordination Care Coordination from 05/13/2023 in [facility] Care Coordination Care Coordination from 04/02/2023 in [facility] Care Coordination Clinical Support from 02/26/2023 in [facility] Coordination from 02/05/2023 in [facility] Care Coordination  SDOH Interventions        Food Insecurity Interventions Intervention Not Indicated -- -- -- Intervention Not Indicated --  Housing Interventions Intervention Not Indicated -- -- -- Intervention Not Indicated --  Transportation Interventions Intervention Not Indicated -- -- -- Intervention Not Indicated  [today, patient reports her adult local children provide transportation] --  Utilities Interventions Intervention Not Indicated -- --  -- Intervention Not Indicated --  Alcohol Usage Interventions -- -- -- -- Intervention Not Indicated (Score <7) --  Financial Strain Interventions -- -- -- Other (Comment)  [some financial challenges] Other (Comment)  [financial strain,  occasional difficulty paying bills or buying basic food items] --  Physical Activity Interventions -- Other (Comments)  [mobility challenges. She uses a rollator to help her walk] Other (Comments)  [mobility challenges] Other (Comments)  [she uses a rollator to help her walk] Intervention Not Indicated  [physical therapy] Other (Comments)  [walking challenges. mobility challenges. uses rollator walker to help her walk]  Stress Interventions -- Provide Counseling  [client has stress in managing her medical needs. She has stress in managing needs of her spouse] Provide Counseling  [stress in managing her medical needs,  stress in managing needs of her spouse] Provide Counseling  [client has some stress in managing her medical needs] Intervention Not Indicated Provide Counseling  [client has stress in managing the care needs of her spouse. 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   Interventions: Evaluation of current treatment plan related to  self management and patient's adherence to plan as  established by provider Provided patient with number [phone] for [employer] [place]  Transitions of Care:  Goal on track:  Yes. Doctor Visits  - discussed the importance of doctor visits  Patient Goals/Self-Care Activities: Participate in Transition of Care Program/Attend [facility] scheduled calls Notify RN Care Manager of TOC call rescheduling needs Take all medications as prescribed Attend all scheduled provider appointments Call pharmacy for medication refills 3-7 days in advance of running out of medications Perform all self care activities independently  Call provider office for new concerns or questions  Work with the social worker to address care coordination needs and will continue to work with the clinical team to address health care and disease management related needs Patient will call [employer] [place] and [employer] regarding incontinence supplies Patient will call PCP office to schedule hospital follow up Patient will continue working with Lorna Few LCSW  Follow Up Plan:  Telephone follow up appointment with care management team member scheduled for:  07/31/23 at 2:00PM with [facility] The patient has been provided with contact information for the care management team and has been advised to call with any health related questions or concerns.          Hilbert Odor RN, CCM [place]  VBCI-Population Health RN Care Manager [phone]

## 2023-07-31 NOTE — Patient Instructions (Signed)
Visit Information  Thank you for taking time to visit with me today. Please don't hesitate to contact me if I can be of assistance to you before our next scheduled telephone appointment.  Our next appointment is by telephone on 08/08/23 at 1pm   Following is a copy of your care plan:   Goals Addressed             This Visit's Progress    30 Day TOC Program       Current Barriers:  Expense of incontinent supplies Chronic Disease Management support and education needs 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   Interventions: Evaluation of current treatment plan related to  self management and patient's adherence to plan as established by provider Provided patient with number [phone] for [employer] [place]  Transitions of Care:  Goal on track:  Yes. Doctor Visits  - discussed the importance of doctor visits  Patient Goals/Self-Care Activities: Participate in Transition of Care Program/Attend [facility] scheduled calls Notify RN Care Manager of TOC call rescheduling needs Take all medications as prescribed Attend all scheduled provider appointments Call pharmacy for medication refills 3-7 days in advance of running out of medications Perform all self care activities independently  Call provider office for new concerns or questions  Work with the social worker to address care coordination needs and will continue to work with the clinical team to address health care and disease management related needs Patient will call [employer] [place] and [employer] regarding incontinence supplies Patient will call PCP office to schedule hospital follow up Patient will continue working with Lorna Few  LCSW  Follow Up Plan:  Telephone follow up appointment with care management team member scheduled for:  07/31/23 at 2:00PM with [facility] The patient has been provided with contact information for the care management team and has been advised to call with any health related questions or concerns.          Patient and RNCM did not discuss sending  Plan of Care  Telephone follow up appointment with care management team member scheduled for: The patient has been provided with contact information for the care management team and has been advised to call with any health related questions or concerns.   Please call the care guide team at [phone] if you need to cancel or reschedule your appointment.   Please call the Suicide and Crisis Lifeline: 988 call the [place] National Suicide Prevention Lifeline: 308-165-8813 or TTY: 231-147-7358 TTY 970-697-3278) to talk to a trained counselor call 911 if you are experiencing a Mental Health or Behavioral Health Crisis or need someone to talk to.  Hilbert Odor RN, CCM [place]  VBCI-Population Health RN Care Manager [phone]

## 2023-08-08 ENCOUNTER — Other Ambulatory Visit: Payer: Self-pay

## 2023-08-08 NOTE — Patient Instructions (Signed)
 Visit Information  Thank you for taking time to visit with me today. Please don't hesitate to contact me if I can be of assistance to you before our next scheduled telephone appointment.  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 Patient next SW appointment is scheduled 09/03/23   RNCM Clinical Goal(s):  Patient will work with the Care Management team over the next 30 days to address Transition of Care Barriers: Medication access - 08/08/23 Weekly follow up with medication reconciliation  Medication Management 08/08/23 Weekly follow up with medication reconciliation - completed medications marked to be removed  Provider appointments through collaboration with RN Care manager, provider, and care team. 08/08/23 Patient reports she attempted to get PCP appointment moved up but was unsuccessful Patient will reach out to resources for adult diapers  Interventions: Evaluation of current treatment plan related to  self management and patient's adherence to plan as established by provider 08/08/23 Weekly TOC follow up calls ongoing for continued assessment/education Provided patient with number [phone] for [employer] [place] 08/08/23 Patient reported she had not been able to reach anyone - Conference call was made by this RN with patient and spoke with Mia who provided the following information: patient or family can go pick up diapers at the  [facility] [address] [facility] Friday 9am - Noon distribution hours [phone] & patient was also given number for [facility] - [phone] (patient states she will call)  Transitions of Care:  Goal on track:  Yes. Doctor Visits  - discussed the importance of doctor visits 08/08/23 Reviewed with patient that she has appointment scheduled 08/09/23 in [facility] with her Cancer MD - patient confirmed Reviewed scheduled appointment with PCP 08/23/23   Patient Goals/Self-Care Activities: Participate in Transition of Care Program/Attend TOC scheduled calls 08/08/23 Ongoing TOC weekly calls to continue to assess/educate/advocate as needed  Notify RN Care Manager of TOC call rescheduling needs  Take all medications as prescribed 08/08/23 Medication reconciliation completed and completed medications marked for removal  Attend all scheduled provider appointments - 08/08/23 reviewed with patient importance Call pharmacy for medication refills 3-7 days in advance of running out of medications Perform all self care activities independently  Call provider office for new concerns or questions 08/08/23 reviewed with patient importance Work with the social worker to address care coordination needs and will continue to work with the clinical team to address health care and disease management related needs Patient will call [employer] [place] and [employer] regarding incontinence supplies - 08/08/23 completed conference call with patient today to [employer]  - Patient will follow up with resources that were provided and states she will also call [employer] Patient will call PCP office to schedule hospital follow up - 08/08/23 patient reports 3/14 was her earliest available appointment Patient will continue working with Lorin Picket  [place] LCSW - 08/08/23 ongoing  Follow Up Plan:  Telephone follow up appointment with care management team member scheduled for:  08/14/23 1pm  The patient has been provided with contact information for the  care management team and has been advised to call with any health related questions or concerns.          The patient verbalized understanding of instructions, educational materials, and care plan provided today and DECLINED offer to receive copy of patient instructions, educational materials, and care plan.   Telephone follow up appointment with care management team member scheduled for: The patient has been provided with contact information for the care management team and has been advised to call with any health related questions or concerns.   Please call the care guide team at [phone] if you need to cancel or reschedule your appointment.   Please call the Suicide and Crisis Lifeline: 988 call the [place] National Suicide Prevention Lifeline: 3466713720 or TTY: (205)138-9781 TTY (430) 006-3496) to talk to a trained counselor call 1-800-273-TALK (toll free, 24 hour hotline) call 911 if you are experiencing a Mental Health or Behavioral Health Crisis or need someone to talk to.  Hilbert Odor RN, CCM [place]  VBCI-Population Health RN Care Manager [phone]

## 2023-08-08 NOTE — Patient Outreach (Signed)
 Care Management  Transitions of Care Program Transitions of Care Post-discharge week 3   08/08/2023 Name: Alexandra [name] MRN: [id number] DOB: 08-16-1940  Subjective: Alexandra [name] is a 83 y.o. year old female who is a primary care patient of Cox, Kirsten, MD. The Care Management team Engaged with patient Engaged with patient by telephone to assess and address transitions of care needs.   Consent to Services:  Patient was given information about care management services, agreed to services, and gave verbal consent to participate.   Assessment:     Patient reports she is doing well. Has appointment 08/09/23 with her cancer MD in [facility]. Patient reported she had not been able to talk with anyone at the [employer] [place] and we called together and obtained resource information. Patient is to follow up.       SDOH Interventions    Flowsheet Row Telephone from 07/22/2023 in [place] POPULATION HEALTH DEPARTMENT Care Coordination from 07/03/2023 in [facility] Care Coordination Care Coordination from 05/13/2023 in [facility] Care Coordination Care Coordination from 04/02/2023 in [facility] Care Coordination Clinical Support from 02/26/2023 in [facility] Coordination from 02/05/2023 in [facility] Care Coordination  SDOH Interventions        Food Insecurity Interventions Intervention Not Indicated -- -- -- Intervention Not Indicated --  Housing Interventions Intervention Not Indicated -- -- -- Intervention Not Indicated --  Transportation Interventions Intervention Not Indicated -- -- -- Intervention Not Indicated  [today, patient reports her adult local children provide transportation] --  Utilities Interventions Intervention Not Indicated -- -- -- Intervention Not Indicated --  Alcohol Usage Interventions -- -- -- -- Intervention Not Indicated (Score <7) --  Financial Strain  Interventions -- -- -- Other (Comment)  [some financial challenges] Other (Comment)  [financial strain,  occasional difficulty paying bills or buying basic food items] --  Physical Activity Interventions -- Other (Comments)  [mobility challenges. She uses a rollator to help her walk] Other (Comments)  [mobility challenges] Other (Comments)  [she uses a rollator to help her walk] Intervention Not Indicated  [physical therapy] Other (Comments)  [walking challenges. mobility challenges. uses rollator walker to help her walk]  Stress Interventions -- Provide Counseling  [client has stress in managing her medical needs. She has stress in managing needs of her spouse] Provide Counseling  [stress in managing her medical needs,  stress in managing needs of her spouse] Provide Counseling  [client has some stress in managing her medical needs] Intervention Not Indicated Provide Counseling  [client has stress in managing the care needs of her spouse. 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 Patient next SW appointment is scheduled 09/03/23  RNCM Clinical Goal(s):  Patient will work with the Care Management team over the next 30 days to address Transition of Care Barriers: Medication access - 08/08/23 Weekly follow up with medication reconciliation  Medication Management  08/08/23 Weekly follow up with medication reconciliation - completed medications marked to be removed  Provider appointments through collaboration with RN Care manager, provider, and care team. 08/08/23 Patient reports she attempted to get PCP appointment moved up but was unsuccessful Patient will reach out to resources for adult diapers  Interventions: Evaluation of current treatment plan related to  self management and patient's adherence to plan as established by provider 08/08/23 Weekly TOC follow up calls ongoing for continued assessment/education Provided patient with number [phone] for [employer] [facility] [place] 08/08/23 Patient reported she had not been able to reach anyone - Conference call was made by this RN with patient and spoke with Mia who provided the following information: patient or family can go pick up diapers at the  [facility] [address] [facility] Friday 9am - Noon distribution hours [phone] & patient was also given number for [employer] - [phone] (patient states she will call)  Transitions of Care:  Goal on track:  Yes. Doctor Visits  - discussed the importance of doctor visits 08/08/23 Reviewed with patient that she has appointment scheduled 08/09/23 in [facility] with her Cancer MD - patient confirmed Reviewed scheduled appointment with PCP 08/23/23   Patient Goals/Self-Care Activities: Participate in Transition of Care Program/Attend TOC scheduled calls 08/08/23 Ongoing TOC weekly calls to continue to assess/educate/advocate as needed  Notify RN Care Manager of TOC call rescheduling needs  Take all medications as prescribed 08/08/23 Medication reconciliation completed and completed medications marked for removal  Attend all scheduled provider appointments - 08/08/23 reviewed with patient importance Call pharmacy for medication refills 3-7 days in advance of running out of medications Perform all self care activities independently  Call provider office  for new concerns or questions 08/08/23 reviewed with patient importance Work with the social worker to address care coordination needs and will continue to work with the clinical team to address health care and disease management related needs Patient will call [employer] [place] and [employer] regarding incontinence supplies - 08/08/23 completed conference call with patient today to [employer] [facility] [place] - Patient will follow up with resources that were provided and states she will also call [employer] Patient will call PCP office to schedule hospital follow up - 08/08/23 patient reports 3/14 was her earliest available appointment Patient will continue working with Lorna Few LCSW - 08/08/23 ongoing  Follow Up Plan:  Telephone follow up appointment with care management team member scheduled for:  08/14/23 1pm  The patient has been provided with contact information for the care management team and has been advised to call with any health related questions or concerns.          Plan: Telephone follow up appointment with care management team member scheduled for: 08/14/23 1pm   The patient has been provided with contact information for the care management team and has been advised to call with any health related questions or concerns.   Hilbert Odor RN, CCM [place]  VBCI-Population Health RN Care Manager [phone]

## 2023-08-14 ENCOUNTER — Other Ambulatory Visit: Payer: Self-pay

## 2023-08-14 NOTE — Patient Instructions (Signed)
 Visit Information  Thank you for taking time to visit with me today. Please don't hesitate to contact me if I can be of assistance to you before our next scheduled telephone appointment.  Our next appointment is by telephone on 08/22/23 at 1pm  Following is a copy of your care plan:   Goals Addressed             This Visit's Progress    30 Day TOC Program       Current Barriers:  Expense of incontinent supplies 08/08/23 Conference call was made to [facility] [employer] [facility] [place] and patient was given information and states she will call 08/14/23 Patient reports this has been addressed and she is able to get adult diapers - Patient asked about getting Purewick and conference call was placed to [facility] and patient was advised of cost and patient did not want to purchase Chronic Disease Management support and education needs 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 08/14/23 Patient has resource for adult diapers now -  Patient next SW appointment is scheduled 09/03/23   RNCM Clinical Goal(s):  Patient will work with the Care Management team over the next 30 days to address Transition of Care Barriers: Medication access - 08/14/23 Weekly follow up with medication reconciliation  Medication Management 08/14/23 Weekly follow up with medication reconciliation - completed medications marked to be removed  Provider appointments through collaboration with RN Care manager, provider, and care team. 08/14/23 Patient reports PCP appointment is 08/23/23 and appointment with "nerve doctor" is 08/21/23 Patient will reach out to resources for adult diapers 08/14/23 Patient reports this was MET  Interventions: Evaluation of current treatment plan related to  self management and patient's adherence to plan as established by provider 08/14/23/25 Weekly TOC follow up calls ongoing for continued  assessment/education Provided patient with number [phone] for [employer] [facility] [place] 08/08/23 Patient reported she had not been able to reach anyone - Conference call was made by this RN with patient and spoke with Mia who provided the following information: patient or family can go pick up diapers at the  [facility] [address] [facility] Friday 9am - Noon distribution hours [phone] & patient was also given number for [employer] - [phone] (patient states she will call) 08/14/23 - Patient reports she has resource in [place] for adult diapers  Transitions of Care:  Goal on track:  Yes. Reviewed scheduled appointment with PCP 08/23/23 08/14/23 Reviewed with patient who also states she is seeing "nerve doctor" 08/21/23 for numbness in her hands  Patient Goals/Self-Care Activities: Participate in Transition of Care Program/Attend TOC scheduled calls 08/14/23 Ongoing TOC weekly calls to continue to assess/educate/advocate as needed  Notify RN Care Manager of TOC call rescheduling needs  Take all medications as prescribed 08/14/23 Medication reconciliation completed and completed medications marked for removal  Call pharmacy for medication refills 3-7 days in advance of running out of medications Perform all self care activities independently  Call provider office for new concerns or questions 08/14/23 reviewed with patient importance Work with the social worker to address care coordination needs and will continue to work with the clinical team to address health care and disease management related needs Patient will call [employer] [place] and [employer] regarding incontinence supplies - 08/08/23 completed conference call with patient today to [employer]  [facility] [place] - Patient will follow up with resources that were provided and states she will also call [employer] 08/14/23 Patient reports she now has resoucre Patient will continue working with Lorna Few LCSW - 08/08/23 ongoing  Follow Up Plan:  Telephone  follow up appointment with care management team member scheduled for:  08/22/23 1pm  The patient has been provided with contact information for the care management team and has been advised to call with any health related questions or concerns.          The patient verbalized understanding of instructions, educational materials, and care plan provided today and DECLINED offer to receive copy of patient instructions, educational materials, and care plan.    The patient has been provided with contact information for the care management team and has been advised to call with any health related questions or concerns.   Please call the care guide team at [phone] if you need to cancel or reschedule your appointment.   Please call the Suicide and Crisis Lifeline: 988 call the [place] National Suicide Prevention Lifeline: 269 802 6498 or TTY: (712)560-8004 TTY 636-825-2084) to talk to a trained counselor call 1-800-273-TALK (toll free, 24 hour hotline) call 911 if you are experiencing a Mental Health or Behavioral Health Crisis or need someone to talk to.  Hilbert Odor RN, CCM [place]  VBCI-Population Health RN Care Manager [phone]

## 2023-08-14 NOTE — Patient Outreach (Signed)
 Care Management  Transitions of Care Program Transitions of Care Post-discharge week 4   08/14/2023 Name: Alexandra [name] MRN: [id number] DOB: 09/02/1940  Subjective: Alexandra [name] is a 83 y.o. year old female who is a primary care patient of Cox, Kirsten, MD. The Care Management team Engaged with patient Engaged with patient by telephone to assess and address transitions of care needs.   Consent to Services:  Patient was given information about care management services, agreed to services, and gave verbal consent to participate.   Assessment:     Patient states she is doing well and now has resource in [place] for adult diapers. TOC RN attempted to help patient regarding question about Purewick system but patient feels it is too costly and she will just wear the adult diapers. Patient reports she has an appointment 08/21/23 with "nerve doctor" to discuss numbness to her hands. Patient agreed to 1 additional TOC call and has TOC RN name/number and understands she can call prior to scheduled appointment with any issues or concerns      SDOH Interventions    Flowsheet Row Telephone from 07/22/2023 in [place] POPULATION HEALTH DEPARTMENT Care Coordination from 07/03/2023 in [facility] Care Coordination Care Coordination from 05/13/2023 in [facility] Care Coordination Care Coordination from 04/02/2023 in [facility] Care Coordination Clinical Support from 02/26/2023 in [facility] Care Coordination from 02/05/2023 in [facility] Care Coordination  SDOH Interventions        Food Insecurity Interventions Intervention Not Indicated -- -- -- Intervention Not Indicated --  Housing Interventions Intervention Not Indicated -- -- -- Intervention Not Indicated --  Transportation Interventions Intervention Not Indicated -- -- -- Intervention Not Indicated  [today, patient reports her adult local  children provide transportation] --  Utilities Interventions Intervention Not Indicated -- -- -- Intervention Not Indicated --  Alcohol Usage Interventions -- -- -- -- Intervention Not Indicated (Score <7) --  Financial Strain Interventions -- -- -- Other (Comment)  [some financial challenges] Other (Comment)  [financial strain,  occasional difficulty paying bills or buying basic food items] --  Physical Activity Interventions -- Other (Comments)  [mobility challenges. She uses a rollator to help her walk] Other (Comments)  [mobility challenges] Other (Comments)  [she uses a rollator to help her walk] Intervention Not Indicated  [physical therapy] Other (Comments)  [walking challenges. mobility challenges. uses rollator walker to help her walk]  Stress Interventions -- Provide Counseling  [client has stress in managing her medical needs. She has stress in managing needs of her spouse] Provide Counseling  [stress in managing her medical needs,  stress in managing needs of her spouse] Provide Counseling  [client has some stress in managing her medical needs] Intervention Not Indicated Provide Counseling  [client has stress in managing the care needs of her spouse. She has stress in managing financial needs]  Social Connections Interventions -- -- -- -- Intervention Not Indicated --  Health Literacy Interventions -- -- -- -- Intervention Not Indicated --        Goals Addressed             This Visit's Progress    30 Day TOC Program       Current Barriers:  Expense of incontinent supplies 08/08/23 Conference call was made to [facility] [employer] [place] and patient was given information and states she will call 08/14/23 Patient reports this has been addressed and she is  able to get adult diapers - Patient asked about getting Purewick and conference call was placed to [facility] and patient was advised of cost and patient did not want to purchase Chronic Disease Management support and education needs related to  CKD Stage V and Bladder Cancer 08/14/23 Ongoing TOC follow up with assessment and education  Financial Constraints  - 08/08/23 Information given to patient for adult diapers. 08/14/23 Patient has resource for adult diapers now -  Patient next SW appointment is scheduled 09/03/23   RNCM Clinical Goal(s):  Patient will work with the Care Management team over the next 30 days to address Transition of Care Barriers: Medication access - 08/14/23 Weekly follow up with medication reconciliation  Medication Management 08/14/23 Weekly follow up with medication reconciliation - completed medications marked to be removed  Provider appointments through collaboration with RN Care manager, provider, and care team. 08/14/23 Patient reports PCP appointment is 08/23/23 and appointment with "nerve doctor" is 08/21/23 Patient will reach out to resources for adult diapers 08/14/23 Patient reports this was MET  Interventions: Evaluation of current treatment plan related to  self management and patient's adherence to plan as established by provider 08/14/23/25 Weekly TOC follow up calls ongoing for continued assessment/education Provided patient with number [phone] for [employer] [place] 08/08/23 Patient reported she had not been able to reach anyone - Conference call was made by this RN with patient and spoke with Mia who provided the following information: patient or family can go pick up diapers at the  [facility] [address] [facility] Friday 9am - Noon distribution hours [phone] & patient was also given number for [employer] - [phone] (patient states she will call) 08/14/23 - Patient reports she has resource in [place] for adult diapers  Transitions of Care:  Goal on track:  Yes. Reviewed scheduled appointment with PCP 08/23/23 08/14/23 Reviewed with patient who also states she is seeing "nerve doctor" 08/21/23 for numbness in her hands  Patient Goals/Self-Care Activities: Participate in Transition of  Care Program/Attend TOC scheduled calls 08/14/23 Ongoing TOC weekly calls to continue to assess/educate/advocate as needed  Notify RN Care Manager of TOC call rescheduling needs  Take all medications as prescribed 08/14/23 Medication reconciliation completed and completed medications marked for removal  Call pharmacy for medication refills 3-7 days in advance of running out of medications Perform all self care activities independently  Call provider office for new concerns or questions 08/14/23 reviewed with patient importance Work with the social worker to address care coordination needs and will continue to work with the clinical team to address health care and disease management related needs Patient will call [employer] [place] and [employer] regarding incontinence supplies - 08/08/23 completed conference call with patient today to [employer] [place] - Patient will follow up with resources that were provided and states she will also call [employer] 08/14/23 Patient reports she now has resoucre Patient will continue working with Lorna Few LCSW - 08/08/23 ongoing  Follow Up Plan:  Telephone follow up appointment with care management team member scheduled for:  08/22/23 1pm  The patient has been provided with contact information for the care management team and has been advised to call with any health related questions or concerns.          Plan: Telephone follow up appointment with care management team member scheduled for: 08/22/23 1pm The patient has been provided with contact information for the care management team and has been  advised to call with any health related questions or concerns.   Hilbert Odor RN, CCM [place]  VBCI-Population Health RN Care Manager [phone]

## 2023-08-22 ENCOUNTER — Telehealth: Payer: Self-pay

## 2023-08-22 ENCOUNTER — Other Ambulatory Visit: Payer: Self-pay

## 2023-08-22 NOTE — Progress Notes (Signed)
 Subjective:  Patient ID: Alexandra [name], female    DOB: 28-Jan-1941  Age: 83 y.o. MRN: [id number]  Chief Complaint  Patient presents with   Medical Management of Chronic Issues   Discussed the use of AI scribe software for clinical note transcription with the patient, who gave verbal consent to proceed.  History of Present Illness   The patient, with a history of hypertension, restless leg syndrome, hypothyroidism, vitamin B12 deficiency, anemia, and chronic obstructive pulmonary disease (COPD), presents with numbness in her hands. She recently saw Dr Deberah Castle, [facility], who diagnosed CTS BL. He recommended injections for treatment, which the patient hopes will alleviate the numbness. She also has braces for her hands, but finds them too large to wear while driving.  The patient is managing her hypertension with amlodipine 10mg  daily and torsemide 20mg  as needed. She denies any swelling and is wearing compression stockings. Her restless leg syndrome is well-controlled with ropinirole twice daily. She is also on levothyroxine daily for hypothyroidism and has completed a course of B12 injections two months ago. She is currently taking over-the-counter B12 supplements daily.  For anemia, she is taking an iron pill and B12 otc daily.  She is on a vitamin D supplement daily. Her COPD is managed with Breztri, which she uses once in the morning. She denies any shortness of breath or coughing and is not on home oxygen. She does report some shortness of breath with exertion. She does not like to take higher dose because it causes her to be jittery.   The patient also reports incontinence, for which she wears pull-ups and pads. She is seeing a urologist every three months for stent replacement. She has bladder cancer and prefers no further treatment.     CKD stage 5.  Stable.  Sees nephrology.  Osteoporosis:  vitamin D 5000 units daily.    Hyperlipidemia:  Lipitor 40 mg daily.     Bladder Cancer: sees Dr. Saddie Benders.  Off of chemotherapy for several months.  Is no longer following with oncology.  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 She is doing weil without treatment.   RLS: on ropinirole 2 mg twice daily.   Hypothyroidism: on synthroid 100 mcg once daily in am.  Chronic respiratory failure with hypoxia resolved. Previously on 2 L daily. On breztri 1 puffs twice daily, Has ventolin HFA.  Uses hospital bed to elevate head and legs.   Full incontinence of urine. Pull ups 4-5 per day. Large. Chux 1 per night.   B12 deficiency: completed b12 injections 2 months ago and on otc b12 once daily.      02/26/2023    9:17 AM 01/25/2023    9:34 AM 01/07/2023    1:38 PM 09/17/2022    8:59 AM 08/29/2022    2:30 PM  Depression screen PHQ 2/9  Decreased Interest 0 0 0 0 0  Down, Depressed, Hopeless 0 0 0 0 0  PHQ - 2 Score 0 0 0 0 0  Altered sleeping 0 0     Tired, decreased energy 0 0     Change in appetite 0 0     Feeling bad or failure about yourself  0 0     Trouble concentrating 0 0     Moving slowly or fidgety/restless 0 0     Suicidal  thoughts 0 0     PHQ-9 Score 0 0     Difficult doing work/chores Not difficult at all Not difficult at all           02/26/2023    9:04 AM  Fall Risk   Falls in the past year? 0  Number falls in past yr: 0  Injury with Fall? 0  Risk for fall due to : No Fall Risks  Follow up Falls evaluation completed;Education provided    Patient Care Team: Blane Ohara, MD as PCP - General (Internal Medicine) Loni Muse, MD (Inactive) as Consulting Physician (Internal Medicine) Bufford Buttner, MD as Consulting Physician (Nephrology) Debroah Baller, MD as Consulting Physician (Urology) Dulce Sellar Iline Oven, MD as Consulting Physician (Cardiology) Gabriel Carina, [facility]  (Inactive) (Pharmacist)   Review of Systems  Constitutional:  Negative for chills, fatigue and fever.  HENT:  Negative for congestion, ear pain and sore throat.   Respiratory:  Negative for cough and shortness of breath.   Cardiovascular:  Negative for chest pain and palpitations.  Gastrointestinal:  Negative for abdominal pain, constipation, diarrhea, nausea and vomiting.  Endocrine: Negative for polydipsia, polyphagia and polyuria.  Genitourinary:  Negative for difficulty urinating and dysuria.  Musculoskeletal:  Positive for arthralgias (Bilateral hand pain). Negative for back pain and myalgias.  Skin:  Negative for rash.  Neurological:  Negative for headaches.  Psychiatric/Behavioral:  Negative for dysphoric mood. The patient is not nervous/anxious.     Current Outpatient Medications on File Prior to Visit  Medication Sig Dispense Refill   amLODipine (NORVASC) 10 MG tablet TAKE 1 TABLET(10 MG) BY MOUTH DAILY 90 tablet 1   atorvastatin (LIPITOR) 40 MG tablet Take 1 tablet (40 mg total) by mouth daily. Per ccm 90 tablet 1   Budeson-Glycopyrrol-Formoterol (BREZTRI AEROSPHERE) 160-9-4.8 MCG/ACT AERO Inhale 2 puffs into the lungs in the morning and at bedtime. 10.7 g 4   Cholecalciferol (VITAMIN D) 125 MCG (5000 UT) CAPS Take 1 capsule by mouth daily.     cyanocobalamin (VITAMIN B12) 1000 MCG/ML injection Inject 1 mL (1,000 mcg total) into the muscle every 14 (fourteen) days. 4 mL 1   ferrous sulfate 325 (65 FE) MG tablet Take 1 tablet (325 mg total) by mouth daily. 90 tablet 0   levothyroxine (SYNTHROID) 100 MCG tablet Take 1 tablet (100 mcg total) by mouth daily. 90 tablet 0   melatonin 1 MG TABS tablet Take 1 tablet (1 mg total) by mouth at bedtime. 30 tablet 1   nitroGLYCERIN (NITROSTAT) 0.4 MG SL tablet Place 1 tablet (0.4 mg total) under the tongue every 5 (five) minutes as needed for chest pain. 30 tablet 3   rOPINIRole (REQUIP) 2 MG tablet TAKE 1 TABLET(2 MG) BY MOUTH TWICE DAILY 60  tablet 2   torsemide (DEMADEX) 20 MG tablet Take 1 tablet (20 mg total) by mouth daily as needed (edema). 30 tablet 2   VENTOLIN HFA 108 (90 Base) MCG/ACT inhaler Inhale 1-2 puffs into the lungs every 6 (six) hours as needed for wheezing or shortness of breath. 18 g 2   No current facility-administered medications on file prior to visit.   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 83 yo   BACK SURGERY  2016   lower L 4 to L 5   bladder tack and uterus lift  10/10/2015   CHOLECYSTECTOMY     summer of 2022   CYSTOSCOPY WITH RETROGRADE PYELOGRAM, URETEROSCOPY AND STENT PLACEMENT Right 08/04/2022   Procedure: CYSTOSCOPY WITH RIGHT URETERAL STENT EXCHANGE;  Surgeon: Despina Arias, MD;  Location[facility] OR;  Service: Urology;  Laterality: Right;   IR RADIOLOGIST EVAL & MGMT  12/03/2018   IR RADIOLOGIST EVAL & MGMT  12/31/2018   IR REMOVAL TUN ACCESS W/ PORT W/O FL MOD SED  08/08/2022   PARATHYROIDECTOMY Left 07/02/2016   Procedure: LEFT INFERIOR PARATHYROIDECTOMY;  Surgeon: Darnell Level, MD;  Location: [facility] ORS;  Service: General;  Laterality: Left;   SHOULDER OPEN ROTATOR CUFF REPAIR  12/2019   TEE WITHOUT CARDIOVERSION N/A 08/10/2022   Procedure: TRANSESOPHAGEAL ECHOCARDIOGRAM (TEE);  Surgeon: Chrystie Nose, MD;  Location: [facility] ENDOSCOPY;  Service: Cardiovascular;  Laterality: N/A;   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    Family History  Problem Relation Age of Onset   Emphysema Mother    Hyperlipidemia Sister     Hypertension Sister    COPD Sister    Hyperlipidemia Sister    Hypertension Sister    Lymphoma Sister    Obesity Brother    Hyperlipidemia Brother    Hypertension Brother    Breast cancer Neg Hx    Social History   Socioeconomic History   Marital status: Married    Spouse name: Molly Maduro   Number of children: 2   Years of education: 11   Highest education level: 11th grade  Occupational History   Occupation: Retired  Tobacco Use   Smoking status: Former    Current packs/day: 0.00    Average packs/day: 1 pack/day for 50.0 years (50.0 ttl pk-yrs)    Types: Cigarettes    Start date: 06/12/1955    Quit date: 06/11/2005    Years since quitting: 18.2   Smokeless tobacco: Never  Vaping Use   Vaping status: Never Used  Substance and Sexual Activity   Alcohol use: No   Drug use: No   Sexual activity: Yes    Partners: Male  Other Topics Concern   Not on file  Social History Narrative   Not on file   Social Drivers of Health   Financial Resource Strain: Medium Risk (04/02/2023)   Overall Financial Resource Strain (CARDIA)    Difficulty of Paying Living Expenses: Somewhat hard  Food Insecurity: No Food Insecurity (07/23/2023)   Hunger Vital Sign    Worried About Running Out of Food in the Last Year: Never true    Ran Out of Food in the Last Year: Never true  Transportation Needs: No Transportation Needs (07/23/2023)   PRAPARE - Administrator, Civil Service (Medical): No    Lack of Transportation (Non-Medical): No  Physical Activity: Inactive (07/03/2023)   Exercise Vital Sign    Days of Exercise per Week: 0 days    Minutes of Exercise per Session: 0 min  Stress: Stress Concern Present (07/03/2023)   [employer] of Occupational Health - Occupational Stress Questionnaire    Feeling of Stress : To some extent  Social Connections: Moderately Integrated (02/26/2023)   Social Connection and  Isolation Panel [NHANES]    Frequency of Communication with Friends and  Family: More than three times a week    Frequency of Social Gatherings with Friends and Family: More than three times a week    Attends Religious Services: More than 4 times per year    Active Member of [employer] or Organizations: No    Attends Engineer, structural: Never    Marital Status: Married    Objective:  BP (!) 150/80   Pulse (!) 48   Temp (!) 97.2 F (36.2 C)   Resp 14   Ht 5\' 2"  (1.575 m)   Wt 146 lb (66.2 kg)   SpO2 100%   BMI 26.70 kg/m      08/28/2023    1:44 PM 08/23/2023    9:35 AM 08/23/2023    8:38 AM  BP/Weight  Systolic BP 134 150 150  Diastolic BP 76 80 80  Wt. (Lbs) 144  146  BMI 26.34 kg/m2  26.7 kg/m2    Physical Exam Vitals reviewed.  Constitutional:      Appearance: Normal appearance. She is normal weight.  Neck:     Vascular: No carotid bruit.  Cardiovascular:     Rate and Rhythm: Normal rate and regular rhythm.     Heart sounds: Normal heart sounds.  Pulmonary:     Effort: Pulmonary effort is normal. No respiratory distress.     Breath sounds: Normal breath sounds.  Abdominal:     General: Abdomen is flat. Bowel sounds are normal.     Palpations: Abdomen is soft.     Tenderness: There is no abdominal tenderness.  Musculoskeletal:     Right lower leg: Edema (compression stocking) present.     Left lower leg: Edema (compression stockings.) present.     Comments: Carpal tunnel braces BL.   Neurological:     Mental Status: She is alert and oriented to person, place, and time.  Psychiatric:        Mood and Affect: Mood normal.        Behavior: Behavior normal.     Diabetic Foot Exam - Simple   No data filed      Lab Results  Component Value Date   WBC 11.9 (H) 08/23/2023   HGB 9.3 (L) 08/23/2023   HCT 28.6 (L) 08/23/2023   PLT 372 08/23/2023   GLUCOSE 89 08/23/2023   CHOL 120 08/23/2023   TRIG 81 08/23/2023   HDL 43 08/23/2023   LDLCALC 61 08/23/2023   ALT 12 08/23/2023   AST 19 08/23/2023   NA 139 08/23/2023   K  5.0 08/23/2023   CL 105 08/23/2023   CREATININE 3.33 (H) 08/23/2023   BUN 53 (H) 08/23/2023   CO2 18 (L) 08/23/2023   TSH 4.350 07/16/2023   INR 1.1 08/04/2022   HGBA1C 5.3 05/31/2023      Assessment & Plan:   Hypertensive heart disease with chronic diastolic congestive heart failure (HCC) Assessment & Plan: Not at goal. Continue amlodipine 10 mg daily.   Started on hydralazine 25 mg twice daily.    Orders: -     CBC with Differential/Platelet -     Comprehensive metabolic panel -     hydrALAZINE HCl; Take 1 tablet (25 mg total) by mouth 2 (two) times daily.  Dispense: 60 tablet; Refill: 2  Chronic respiratory failure with hypoxia (HCC) Assessment & Plan: Continue oxygen 2 L daily.    Acquired hypothyroidism Assessment & Plan: Previously well controlled  Continue Synthroid at current dose     Impaired fasting glucose Assessment & Plan: Hemoglobin A1c 5.3%, 3 month avg of blood sugars, is in prediabetic range.  In order to prevent progression to diabetes, recommend low carb diet and regular exercise    Mixed hyperlipidemia Assessment & Plan: Well controlled.  No changes to medicines. Continue lipitor 40 mg before bed.  Continue to work on eating a healthy diet and exercise as tolerated Labs drawn today.    Orders: -     Lipid panel  Continuous leakage of urine Assessment & Plan: Pull ups 4-5 per day. Large.  Chux 1 per night.  Order bladder supplies through a company called [employer].     Simple chronic bronchitis (HCC) Assessment & Plan: Occasional exertional dyspnea. Breztri may be more effective with regular use despite causing nervousness. - Encourage regular use of Breztri, one puff in the morning and one puff at night, if tolerated.   Restless leg syndrome Assessment & Plan: Well controlled.  Continue ropinirole 2 mg twice daily   Chronic kidney disease, stage V (HCC) Assessment & Plan: Stable. Management per specialist.     Malignant  neoplasm of lateral wall of urinary bladder (HCC) Assessment & Plan: No further treatment chosen by patient.  Management per specialist.     Bilateral carpal tunnel syndrome Assessment & Plan: Recommend try to use braces as much as she can.  Follow up with orthopedics.       Meds ordered this encounter  Medications   hydrALAZINE (APRESOLINE) 25 MG tablet    Sig: Take 1 tablet (25 mg total) by mouth 2 (two) times daily.    Dispense:  60 tablet    Refill:  2    Orders Placed This Encounter  Procedures   CBC with Differential/Platelet   Comprehensive metabolic panel   Lipid panel     Follow-up: Return in about 2 weeks (around 09/06/2023) for BP CHECK.   I,Marla I Leal-Borjas,acting as a scribe for Blane Ohara, MD.,have documented all relevant documentation on the behalf of Blane Ohara, MD,as directed by  Blane Ohara, MD while in the presence of Blane Ohara, MD.   An After Visit Summary was printed and given to the patient.  I attest that I have reviewed this visit and agree with the plan scribed by my staff.   Blane Ohara, MD [facility] [phone]

## 2023-08-22 NOTE — Patient Outreach (Signed)
Care Management  Transitions of Care Program Transitions of Care Post-discharge Week 5 TOC 30 day Program Closure Note   08/22/2023 Name: Alexandra [name] MRN: [id number] DOB: 13-Jan-1941  Subjective: Alexandra [name] is a 83 y.o. year old female who is a primary care patient of Cox, Kirsten, MD. The Care Management team Engaged with patient Engaged with patient by telephone to assess and address transitions of care needs.   Consent to Services:  Patient was given information about care management services, agreed to services, and gave verbal consent to participate.   Assessment:     Patient reports she continues to do well and has follow up appointment with her PCP tomorrow, 08/23/23. Patient thanked [facility] RN for assisting her to obtain adult diapers at no cost. Patient will continue to work with SW, Lorin Picket with next appointment scheduled 09/03/23. Patient agrees to [facility] RN 30 program case closure.       SDOH Interventions    Flowsheet Row Telephone from 07/22/2023 in [place] POPULATION HEALTH DEPARTMENT Care Coordination from 07/03/2023 in [facility] Community Care Coordination Care Coordination from 05/13/2023 in [facility] Community Care Coordination Care Coordination from 04/02/2023 in [facility] Community Care Coordination Clinical Support from 02/26/2023 in [facility] Coordination from 02/05/2023 in [facility] Community Care Coordination  SDOH Interventions        Food Insecurity Interventions Intervention Not Indicated -- -- -- Intervention Not Indicated --  Housing Interventions Intervention Not Indicated -- -- -- Intervention Not Indicated --  Transportation Interventions Intervention Not Indicated -- -- -- Intervention Not Indicated  [today, patient reports her adult local children provide transportation] --  Utilities Interventions Intervention Not Indicated -- -- -- Intervention Not Indicated --  Alcohol  Usage Interventions -- -- -- -- Intervention Not Indicated (Score <7) --  Financial Strain Interventions -- -- -- Other (Comment)  [some financial challenges] Other (Comment)  [financial strain,  occasional difficulty paying bills or buying basic food items] --  Physical Activity Interventions -- Other (Comments)  [mobility challenges. She uses a rollator to help her walk] Other (Comments)  [mobility challenges] Other (Comments)  [she uses a rollator to help her walk] Intervention Not Indicated  [physical therapy] Other (Comments)  [walking challenges. mobility challenges. uses rollator walker to help her walk]  Stress Interventions -- Provide Counseling  [client has stress in managing her medical needs. She has stress in managing needs of her spouse] Provide Counseling  [stress in managing her medical needs,  stress in managing needs of her spouse] Provide Counseling  [client has some stress in managing her medical needs] Intervention Not Indicated Provide Counseling  [client has stress in managing the care needs of her spouse. She has stress in managing financial needs]  Social Connections Interventions -- -- -- -- Intervention Not Indicated --  Health Literacy Interventions -- -- -- -- Intervention Not Indicated --        Goals Addressed             This Visit's Progress    COMPLETED: 30 Day TOC Program       Current Barriers:  Expense of incontinent supplies 08/08/23 Conference call was made to [facility] [employer] [facility] [place] and patient was given information and states she will call 08/14/23 Patient reports this has been addressed and she is able to get adult diapers - Patient asked about getting Purewick and conference call was placed to [facility] and patient was advised  of cost and patient did not want to purchase - 08/22/23 Patient thanked [facility] RN for the information about adult diapers and states she was able to get some for herself and her husband.  Chronic Disease Management support and education needs  related to CKD Stage V and Bladder Cancer 08/22/23 Patient states she is doing well and has appointment with PCP tomorrow and will continue to follow with her doctors-agreed to [facility] closure Financial Constraints  - 08/08/23 Information given to patient for adult diapers. 08/14/23 Patient has resource for adult diapers now -  Patient next SW appointment is scheduled 09/03/23 and agreed to [facility] 30 day program closure  RNCM Clinical Goal(s):  Patient will work with the Care Management team over the next 30 days to address Transition of Care Barriers: Medication access - 08/22/23 reviewed with no issues/patient agrees with TOC 30 day closure Medication Management 08/22/23 reviewed with no issues/patient agrees with TOC 30 day closure Provider appointments through collaboration with RN Care manager, provider, and care team. 08/14/23 Patient reports PCP appointment is 08/23/23 and appointment with "nerve doctor" is 08/21/23 (08/22/23 patient reports she saw "nerve doctor" yesterday and she received injections in hands and will continue to follow up as directed. Patient confirms PCP appointment tomorrow. 08/22/23 reviewed with no issues/patient agrees with TOC 30 day closure   Interventions: Evaluation of current treatment plan related to  self management and patient's adherence to plan as established by provider 08/22/23 reviewed with no issues/patient agrees with TOC 30 day closure  Transitions of Care:  Goal Met:  Yes. Reviewed scheduled appointment with PCP 08/23/23 08/14/23 Reviewed with patient who also states she is seeing "nerve doctor" 08/21/23 for numbness in her hands: 08/22/23 patient reports she saw "nerve doctor" yesterday and she received injections in hands and will continue to follow up as directed. Patient confirms PCP appointment tomorrow. 08/22/23 reviewed with no issues/patient agrees with TOC 30 day closure  Patient Goals/Self-Care Activities: Participate in Transition of Care Program/Attend TOC scheduled  calls 08/22/23 reviewed with no issues/patient agrees with TOC 30 day closure Notify RN Care Manager of TOC call rescheduling needs 08/22/23 reviewed with no issues/patient agrees with TOC 30 day closure Take all medications as prescribed 08/22/23 reviewed with no issues/patient agrees with TOC 30 day closure Call pharmacy for medication refills 3-7 days in advance of running out of medications 08/22/23 reviewed with no issues/patient agrees with TOC 30 day closure Perform all self care activities independently 08/22/23 reviewed with no issues/patient agrees with TOC 30 day closure Call provider office for new concerns or questions 08/14/23 reviewed with patient importance3/13/25 reviewed with no issues/patient agrees with TOC 30 day closure Work with the social worker to address care coordination needs and will continue to work with the clinical team to address health care and disease management related needs Patient will continue working with Lorna Few LCSW next appointment 09/03/23 - 08/22/23 reviewed with no issues/patient agrees with TOC 30 day closure  Follow Up Plan:  08/22/23 reviewed with no issues/patient agrees with TOC 30 day closure The patient has been provided with contact information for the care management team and has been advised to call with any health related questions or concerns.          Plan: The patient has been provided with contact information for the care management team and has been advised to call with any health related questions or concerns.   Hilbert Odor RN, CCM [place]  VBCI-Population Health RN Care Manager  [phone]   

## 2023-08-23 ENCOUNTER — Ambulatory Visit: Payer: Medicare Other | Admitting: Family Medicine

## 2023-08-23 ENCOUNTER — Encounter: Payer: Self-pay | Admitting: Family Medicine

## 2023-08-23 VITALS — BP 150/80 | HR 48 | Temp 97.2°F | Resp 14 | Ht 62.0 in | Wt 146.0 lb

## 2023-08-23 DIAGNOSIS — R7301 Impaired fasting glucose: Secondary | ICD-10-CM

## 2023-08-23 DIAGNOSIS — N185 Chronic kidney disease, stage 5: Secondary | ICD-10-CM

## 2023-08-23 DIAGNOSIS — E782 Mixed hyperlipidemia: Secondary | ICD-10-CM

## 2023-08-23 DIAGNOSIS — J410 Simple chronic bronchitis: Secondary | ICD-10-CM

## 2023-08-23 DIAGNOSIS — I5032 Chronic diastolic (congestive) heart failure: Secondary | ICD-10-CM

## 2023-08-23 DIAGNOSIS — I110 Hypertensive heart disease with heart failure: Secondary | ICD-10-CM | POA: Diagnosis not present

## 2023-08-23 DIAGNOSIS — G5603 Carpal tunnel syndrome, bilateral upper limbs: Secondary | ICD-10-CM

## 2023-08-23 DIAGNOSIS — E039 Hypothyroidism, unspecified: Secondary | ICD-10-CM

## 2023-08-23 DIAGNOSIS — G2581 Restless legs syndrome: Secondary | ICD-10-CM

## 2023-08-23 DIAGNOSIS — N3945 Continuous leakage: Secondary | ICD-10-CM

## 2023-08-23 DIAGNOSIS — J9611 Chronic respiratory failure with hypoxia: Secondary | ICD-10-CM | POA: Diagnosis not present

## 2023-08-23 DIAGNOSIS — C672 Malignant neoplasm of lateral wall of bladder: Secondary | ICD-10-CM

## 2023-08-23 MED ORDER — HYDRALAZINE HCL 25 MG PO TABS: 25.0000 mg | ORAL_TABLET | Freq: Two times a day (BID) | ORAL | 2 refills | Status: DC

## 2023-08-23 NOTE — Patient Instructions (Addendum)
 Blood pressure is uncontrolled.  Continue amlodipine 10 mg daily.  Started on hydralazine 25 mg twice daily.    I am going to try to order bladder supplies through a company called [employer].

## 2023-08-24 LAB — CBC WITH DIFFERENTIAL/PLATELET
Basophils Absolute: 0 10*3/uL (ref 0.0–0.2)
Basos: 0 %
EOS (ABSOLUTE): 0.2 10*3/uL (ref 0.0–0.4)
Eos: 1 %
Hematocrit: 28.6 % — ABNORMAL LOW (ref 34.0–46.6)
Hemoglobin: 9.3 g/dL — ABNORMAL LOW (ref 11.1–15.9)
Immature Grans (Abs): 0.1 10*3/uL (ref 0.0–0.1)
Immature Granulocytes: 1 %
Lymphocytes Absolute: 1.8 10*3/uL (ref 0.7–3.1)
Lymphs: 15 %
MCH: 30.5 pg (ref 26.6–33.0)
MCHC: 32.5 g/dL (ref 31.5–35.7)
MCV: 94 fL (ref 79–97)
Monocytes Absolute: 0.8 10*3/uL (ref 0.1–0.9)
Monocytes: 7 %
Neutrophils Absolute: 9 10*3/uL — ABNORMAL HIGH (ref 1.4–7.0)
Neutrophils: 76 %
Platelets: 372 10*3/uL (ref 150–450)
RBC: 3.05 x10E6/uL — ABNORMAL LOW (ref 3.77–5.28)
RDW: 13.7 % (ref 11.7–15.4)
WBC: 11.9 10*3/uL — ABNORMAL HIGH (ref 3.4–10.8)

## 2023-08-24 LAB — COMPREHENSIVE METABOLIC PANEL
ALT: 12 IU/L (ref 0–32)
AST: 19 IU/L (ref 0–40)
Albumin: 3.8 g/dL (ref 3.8–4.8)
Alkaline Phosphatase: 115 IU/L (ref 44–121)
BUN/Creatinine Ratio: 16 (ref 12–28)
BUN: 53 mg/dL — ABNORMAL HIGH (ref 8–27)
Bilirubin Total: 0.2 mg/dL (ref 0.0–1.2)
CO2: 18 mmol/L — ABNORMAL LOW (ref 20–29)
Calcium: 8.5 mg/dL — ABNORMAL LOW (ref 8.7–10.3)
Chloride: 105 mmol/L (ref 96–106)
Creatinine, Ser: 3.33 mg/dL — ABNORMAL HIGH (ref 0.57–1.00)
Globulin, Total: 2.5 g/dL (ref 1.5–4.5)
Glucose: 89 mg/dL (ref 70–99)
Potassium: 5 mmol/L (ref 3.5–5.2)
Sodium: 139 mmol/L (ref 134–144)
Total Protein: 6.3 g/dL (ref 6.0–8.5)
eGFR: 13 mL/min/{1.73_m2} — ABNORMAL LOW (ref >59)

## 2023-08-24 LAB — LIPID PANEL
Chol/HDL Ratio: 2.8 ratio (ref 0.0–4.4)
Cholesterol, Total: 120 mg/dL (ref 100–199)
HDL: 43 mg/dL (ref >39)
LDL Chol Calc (NIH): 61 mg/dL (ref 0–99)
Triglycerides: 81 mg/dL (ref 0–149)
VLDL Cholesterol Cal: 16 mg/dL (ref 5–40)

## 2023-08-25 NOTE — Assessment & Plan Note (Signed)
 Previously well controlled Continue Synthroid at current dose

## 2023-08-25 NOTE — Assessment & Plan Note (Signed)
Continue oxygen 2 L daily.

## 2023-08-25 NOTE — Assessment & Plan Note (Signed)
Hemoglobin A1c 5.3%, 3 month avg of blood sugars, is in prediabetic range.  In order to prevent progression to diabetes, recommend low carb diet and regular exercise

## 2023-08-25 NOTE — Assessment & Plan Note (Signed)
 Pull ups 4-5 per day. Large.  Chux 1 per night.  Order bladder supplies through a company called [employer].

## 2023-08-25 NOTE — Assessment & Plan Note (Signed)
 Well controlled.  No changes to medicines. Continue lipitor 40 mg before bed.  Continue to work on eating a healthy diet and exercise as tolerated Labs drawn today.

## 2023-08-25 NOTE — Assessment & Plan Note (Addendum)
 Not at goal. Continue amlodipine 10 mg daily.   Started on hydralazine 25 mg twice daily.

## 2023-08-28 ENCOUNTER — Ambulatory Visit: Admitting: Family Medicine

## 2023-08-28 ENCOUNTER — Ambulatory Visit: Payer: Self-pay | Admitting: Family Medicine

## 2023-08-28 ENCOUNTER — Encounter: Payer: Self-pay | Admitting: Family Medicine

## 2023-08-28 VITALS — BP 134/76 | HR 92 | Temp 97.5°F | Ht 62.0 in | Wt 144.0 lb

## 2023-08-28 DIAGNOSIS — N3001 Acute cystitis with hematuria: Secondary | ICD-10-CM

## 2023-08-28 DIAGNOSIS — R10813 Right lower quadrant abdominal tenderness: Secondary | ICD-10-CM

## 2023-08-28 DIAGNOSIS — R10812 Left upper quadrant abdominal tenderness: Secondary | ICD-10-CM | POA: Diagnosis not present

## 2023-08-28 LAB — POCT URINALYSIS DIP (CLINITEK)
Bilirubin, UA: NEGATIVE
Glucose, UA: NEGATIVE mg/dL
Ketones, POC UA: NEGATIVE mg/dL
Nitrite, UA: NEGATIVE
POC PROTEIN,UA: 30 — AB
Spec Grav, UA: 1.015 (ref 1.010–1.025)
Urobilinogen, UA: 0.2 U/dL
pH, UA: 6 (ref 5.0–8.0)

## 2023-08-28 MED ORDER — CEFTRIAXONE SODIUM 1 G IJ SOLR
1.0000 g | Freq: Once | INTRAMUSCULAR | Status: AC
  Administered 2023-08-28: 1 g via INTRAMUSCULAR

## 2023-08-28 MED ORDER — CIPROFLOXACIN HCL 500 MG PO TABS
500.0000 mg | ORAL_TABLET | Freq: Every day | ORAL | 0 refills | Status: AC
Start: 2023-08-28 — End: 2023-09-02

## 2023-08-28 NOTE — Telephone Encounter (Signed)
  Chief Complaint: Back Pain Symptoms: severe pain to right lower back pain going into groin Frequency: started last night Pertinent Negatives: Patient denies fever, CP, SOB, no new numbness, tingling Disposition: [] ED /[] Urgent Care (no appt availability in office) / [x] Appointment(In office/virtual)/ []  [facility]/ [] Home Care/ [] Refused Recommended Disposition /[] [facility]/ []  Follow-up with PCP Additional Notes: patient called with new onset of right lower back pain that started last night. No lifting or strenuous work. Patient states she hasn't tried any OTC medications due to other medical problems. Patient endorses pain with any movement. Per protocol, patient is recommended to be seen today. Appointment made for today (08/28/2023) at 2:20 PM. Patient verbalized understanding of plan and all questions answered.     Copied from CRM [id number]. Topic: Clinical - Red Word Triage >> Aug 28, 2023 10:56 AM Marlow Baars wrote: Red Word that prompted transfer to Nurse Triage: The patient called in stating she has severe pain in her back and in her groin. She says she did not injure herself but it is really bad. I will transfer her to E2C2 NT Reason for Disposition  [1] SEVERE back pain (e.g., excruciating, unable to do any normal activities) AND [2] not improved 2 hours after pain medicine  Answer Assessment - Initial Assessment Questions 1. ONSET: "When did the pain begin?"      Last night-unsure of what time 2. LOCATION: "Where does it hurt?" (upper, mid or lower back)     Right lower back into groin 3. SEVERITY: "How bad is the pain?"  (e.g., Scale 1-10; mild, moderate, or severe)   - MILD (1-3): Doesn't interfere with normal activities.    - MODERATE (4-7): Interferes with normal activities or awakens from sleep.    - SEVERE (8-10): Excruciating pain, unable to do any normal activities.      10 out of 10 4. PATTERN: "Is the pain constant?" (e.g., yes, no; constant,  intermittent)      constant 5. RADIATION: "Does the pain shoot into your legs or somewhere else?"     No 6. CAUSE:  "What do you think is causing the back pain?"      unsure 7. BACK OVERUSE:  "Any recent lifting of heavy objects, strenuous work or exercise?"     No 8. MEDICINES: "What have you taken so far for the pain?" (e.g., nothing, acetaminophen, NSAIDS)     No medications 9. NEUROLOGIC SYMPTOMS: "Do you have any weakness, numbness, or problems with bowel/bladder control?"     No new neurologic symptoms 10. OTHER SYMPTOMS: "Do you have any other symptoms?" (e.g., fever, abdomen pain, burning with urination, blood in urine)       No  Protocols used: Back Pain-A-AH

## 2023-08-28 NOTE — Telephone Encounter (Signed)
 Agree

## 2023-08-28 NOTE — Progress Notes (Signed)
 3  Acute Office Visit  Subjective:    Patient ID: Alexandra [name], female    DOB: 12-05-1940, 83 y.o.   MRN: [id number]  Chief Complaint  Patient presents with   Groin Pain     HPI: Patient is in today for right sided groin pain since last night, sore, painful to walk and to sit. Constant pain. Denies tenderness or swelling. Pain does not radiate. It woke her from sleep when she moved her leg. No straining. Similar pain on the left side few weeks ago and resolved. No constipation. Bms every day. Ate some cashews last week. Had some dry heaving yesterday. No hematuria.   Hypertension: started hydralazine one week ago for high bp. BP come down to 130/80. Tolerating hydralazine.   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 83 yo   BACK SURGERY  2016   lower L 4 to L 5   bladder tack and uterus lift  10/10/2015   CHOLECYSTECTOMY     summer of 2022   CYSTOSCOPY WITH RETROGRADE PYELOGRAM, URETEROSCOPY AND STENT PLACEMENT Right 08/04/2022   Procedure: CYSTOSCOPY WITH RIGHT URETERAL STENT EXCHANGE;  Surgeon: Despina Arias, MD;  Location: [facility] OR;  Service: Urology;  Laterality: Right;   IR RADIOLOGIST EVAL & MGMT  12/03/2018   IR RADIOLOGIST EVAL & MGMT  12/31/2018   IR REMOVAL TUN ACCESS W/ PORT W/O FL MOD SED  08/08/2022   PARATHYROIDECTOMY Left 07/02/2016   Procedure: LEFT INFERIOR PARATHYROIDECTOMY;  Surgeon: Darnell Level, MD;  Location: [facility] ORS;  Service: General;  Laterality: Left;   SHOULDER OPEN ROTATOR CUFF REPAIR   12/2019   TEE WITHOUT CARDIOVERSION N/A 08/10/2022   Procedure: TRANSESOPHAGEAL ECHOCARDIOGRAM (TEE);  Surgeon: Chrystie Nose, MD;  Location: [facility] ENDOSCOPY;  Service: Cardiovascular;  Laterality: N/A;   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    Family History  Problem Relation Age of Onset   Emphysema Mother    Hyperlipidemia Sister    Hypertension Sister    COPD Sister    Hyperlipidemia Sister    Hypertension Sister    Lymphoma Sister    Obesity Brother    Hyperlipidemia Brother    Hypertension Brother    Breast cancer Neg Hx     Social History   Socioeconomic History   Marital status: Married    Spouse name: Molly Maduro   Number of children: 2   Years of education: 11   Highest education level: 11th grade  Occupational History   Occupation: Retired  Tobacco Use   Smoking status: Former    Current packs/day: 0.00    Average packs/day: 1 pack/day for 50.0 years (50.0 ttl pk-yrs)    Types: Cigarettes    Start date: 06/12/1955    Quit date: 06/11/2005    Years since quitting: 18.2   Smokeless tobacco: Never  Vaping Use   Vaping status: Never Used  Substance and Sexual Activity   Alcohol use: No   Drug use: No   Sexual  activity: Yes    Partners: Male  Other Topics Concern   Not on file  Social History Narrative   Not on file   Social Drivers of Health   Financial Resource Strain: Medium Risk (04/02/2023)   Overall Financial Resource Strain (CARDIA)    Difficulty of Paying Living Expenses: Somewhat hard  Food Insecurity: No Food Insecurity (07/23/2023)   Hunger Vital Sign    Worried About Running Out of Food in the Last Year: Never true    Ran Out of Food in the Last Year: Never true  Transportation Needs: No Transportation Needs (07/23/2023)   PRAPARE - Administrator, Civil Service (Medical): No    Lack of Transportation (Non-Medical): No  Physical Activity: Inactive (07/03/2023)   Exercise Vital Sign    Days of Exercise per Week: 0 days     Minutes of Exercise per Session: 0 min  Stress: Stress Concern Present (07/03/2023)   [employer] of Occupational Health - Occupational Stress Questionnaire    Feeling of Stress : To some extent  Social Connections: Moderately Integrated (02/26/2023)   Social Connection and Isolation Panel [NHANES]    Frequency of Communication with Friends and Family: More than three times a week    Frequency of Social Gatherings with Friends and Family: More than three times a week    Attends Religious Services: More than 4 times per year    Active Member of [employer] or Organizations: No    Attends Banker Meetings: Never    Marital Status: Married  Catering manager Violence: Not At Risk (07/23/2023)   Humiliation, Afraid, Rape, and Kick questionnaire    Fear of Current or Ex-Partner: No    Emotionally Abused: No    Physically Abused: No    Sexually Abused: No    Outpatient Medications Prior to Visit  Medication Sig Dispense Refill   amLODipine (NORVASC) 10 MG tablet TAKE 1 TABLET(10 MG) BY MOUTH DAILY 90 tablet 1   atorvastatin (LIPITOR) 40 MG tablet Take 1 tablet (40 mg total) by mouth daily. Per ccm 90 tablet 1   Budeson-Glycopyrrol-Formoterol (BREZTRI AEROSPHERE) 160-9-4.8 MCG/ACT AERO Inhale 2 puffs into the lungs in the morning and at bedtime. 10.7 g 4   Cholecalciferol (VITAMIN D) 125 MCG (5000 UT) CAPS Take 1 capsule by mouth daily.     cyanocobalamin (VITAMIN B12) 1000 MCG/ML injection Inject 1 mL (1,000 mcg total) into the muscle every 14 (fourteen) days. 4 mL 1   ferrous sulfate 325 (65 FE) MG tablet Take 1 tablet (325 mg total) by mouth daily. 90 tablet 0   hydrALAZINE (APRESOLINE) 25 MG tablet Take 1 tablet (25 mg total) by mouth 2 (two) times daily. 60 tablet 2   levothyroxine (SYNTHROID) 100 MCG tablet Take 1 tablet (100 mcg total) by mouth daily. 90 tablet 0   melatonin 1 MG TABS tablet Take 1 tablet (1 mg total) by mouth at bedtime. 30 tablet 1   nitroGLYCERIN  (NITROSTAT) 0.4 MG SL tablet Place 1 tablet (0.4 mg total) under the tongue every 5 (five) minutes as needed for chest pain. 30 tablet 3   rOPINIRole (REQUIP) 2 MG tablet TAKE 1 TABLET(2 MG) BY MOUTH TWICE DAILY 60 tablet 2   torsemide (DEMADEX) 20 MG tablet Take 1 tablet (20 mg total) by mouth daily as needed (edema). 30 tablet 2   VENTOLIN HFA 108 (90 Base) MCG/ACT inhaler Inhale 1-2 puffs into the lungs every 6 (six) hours as needed for wheezing  or shortness of breath. 18 g 2   No facility-administered medications prior to visit.    Allergies  Allergen Reactions   Contrast Media [Iodinated Contrast Media] Hives, Itching and Rash   Omeprazole Itching   Shellfish Allergy Nausea And Vomiting   Shellfish-Derived Products Nausea And Vomiting    Review of Systems  Constitutional:  Negative for chills and fever.  Gastrointestinal:  Negative for abdominal pain.       Right groin pain  Genitourinary:  Negative for dysuria, flank pain, frequency and urgency.  Musculoskeletal:  Negative for back pain.       Objective:        08/28/2023    1:44 PM 08/23/2023    9:35 AM 08/23/2023    8:38 AM  Vitals with BMI  Height 5\' 2"   5\' 2"   Weight 144 lbs  146 lbs  BMI 26.33  26.7  Systolic 134 150 086  Diastolic 76 80 80  Pulse 92  48    No data found.   Physical Exam Vitals reviewed.  Constitutional:      Appearance: Normal appearance. She is normal weight.  Cardiovascular:     Rate and Rhythm: Normal rate and regular rhythm.     Heart sounds: Normal heart sounds.  Pulmonary:     Effort: Pulmonary effort is normal. No respiratory distress.     Breath sounds: Normal breath sounds.  Abdominal:     General: Abdomen is flat. Bowel sounds are normal.     Palpations: Abdomen is soft. There is no mass.     Tenderness: There is abdominal tenderness (RIGHT LOWER QUADRANT AND LEFT LOWER QUADRANT.Marland Kitchen). There is no right CVA tenderness, left CVA tenderness or guarding.     Hernia: No hernia is  present.  Neurological:     Mental Status: She is alert and oriented to person, place, and time.  Psychiatric:        Mood and Affect: Mood normal.        Behavior: Behavior normal.     Health Maintenance Due  Topic Date Due   Zoster Vaccines- Shingrix (2 of 2) 10/12/2021    There are no preventive care reminders to display for this patient.   Lab Results  Component Value Date   TSH 4.350 07/16/2023   Lab Results  Component Value Date   WBC 11.9 (H) 08/23/2023   HGB 9.3 (L) 08/23/2023   HCT 28.6 (L) 08/23/2023   MCV 94 08/23/2023   PLT 372 08/23/2023   Lab Results  Component Value Date   NA 139 08/23/2023   K 5.0 08/23/2023   CO2 18 (L) 08/23/2023   GLUCOSE 89 08/23/2023   BUN 53 (H) 08/23/2023   CREATININE 3.33 (H) 08/23/2023   BILITOT 0.2 08/23/2023   ALKPHOS 115 08/23/2023   AST 19 08/23/2023   ALT 12 08/23/2023   PROT 6.3 08/23/2023   ALBUMIN 3.8 08/23/2023   CALCIUM 8.5 (L) 08/23/2023   ANIONGAP 9 08/13/2022   EGFR 13 (L) 08/23/2023   Lab Results  Component Value Date   CHOL 120 08/23/2023   Lab Results  Component Value Date   HDL 43 08/23/2023   Lab Results  Component Value Date   LDLCALC 61 08/23/2023   Lab Results  Component Value Date   TRIG 81 08/23/2023   Lab Results  Component Value Date   CHOLHDL 2.8 08/23/2023   Lab Results  Component Value Date   HGBA1C 5.3 05/31/2023  Assessment & Plan:  Right lower quadrant abdominal tenderness without rebound tenderness Assessment & Plan: TREAT WITH CIPRO FOR UTI, BUT WOULD ALSO COVER DIVERTICULITIS.   Orders: -     POCT URINALYSIS DIP (CLINITEK) -     Ciprofloxacin HCl; Take 1 tablet (500 mg total) by mouth daily for 5 days.  Dispense: 5 tablet; Refill: 0 -     Urine Culture -     cefTRIAXone Sodium  Acute cystitis with hematuria Assessment & Plan: START ON CIPRO 500 MG ONE TABLET DAILY X 5 DAYS.   Orders: -     Urine Culture  Left upper quadrant abdominal tenderness  without rebound tenderness Assessment & Plan: TREAT WITH CIPRO FOR UTI, BUT WOULD ALSO COVER DIVERTICULITIS.       Meds ordered this encounter  Medications   ciprofloxacin (CIPRO) 500 MG tablet    Sig: Take 1 tablet (500 mg total) by mouth daily for 5 days.    Dispense:  5 tablet    Refill:  0   cefTRIAXone (ROCEPHIN) injection 1 g    Orders Placed This Encounter  Procedures   Urine Culture   POCT URINALYSIS DIP (CLINITEK)     Follow-up: Return if symptoms worsen or fail to improve.  An After Visit Summary was printed and given to the patient.  Blane Ohara, MD [facility] [phone]

## 2023-08-30 LAB — URINE CULTURE

## 2023-08-31 DIAGNOSIS — N3000 Acute cystitis without hematuria: Secondary | ICD-10-CM | POA: Insufficient documentation

## 2023-08-31 DIAGNOSIS — R10813 Right lower quadrant abdominal tenderness: Secondary | ICD-10-CM | POA: Insufficient documentation

## 2023-08-31 DIAGNOSIS — G5603 Carpal tunnel syndrome, bilateral upper limbs: Secondary | ICD-10-CM | POA: Insufficient documentation

## 2023-08-31 DIAGNOSIS — R10812 Left upper quadrant abdominal tenderness: Secondary | ICD-10-CM | POA: Insufficient documentation

## 2023-08-31 DIAGNOSIS — N3001 Acute cystitis with hematuria: Secondary | ICD-10-CM | POA: Insufficient documentation

## 2023-08-31 NOTE — Assessment & Plan Note (Signed)
 Well controlled.  Continue ropinirole 2 mg twice daily

## 2023-08-31 NOTE — Assessment & Plan Note (Signed)
 Stable. Management per specialist

## 2023-08-31 NOTE — Assessment & Plan Note (Signed)
 TREAT WITH CIPRO FOR UTI, BUT WOULD ALSO COVER DIVERTICULITIS.

## 2023-08-31 NOTE — Assessment & Plan Note (Signed)
 START ON CIPRO 500 MG ONE TABLET DAILY X 5 DAYS.

## 2023-08-31 NOTE — Assessment & Plan Note (Signed)
 Recommend try to use braces as much as she can.  Follow up with orthopedics.

## 2023-08-31 NOTE — Assessment & Plan Note (Signed)
 No further treatment chosen by patient.  Management per specialist.

## 2023-08-31 NOTE — Assessment & Plan Note (Signed)
 Occasional exertional dyspnea. Breztri may be more effective with regular use despite causing nervousness. - Encourage regular use of Breztri, one puff in the morning and one puff at night, if tolerated.

## 2023-09-03 ENCOUNTER — Encounter: Payer: Self-pay | Admitting: Licensed Clinical Social Worker

## 2023-09-03 NOTE — Patient Instructions (Signed)
 Visit Information  Thank you for taking time to visit with me today. Please don't hesitate to contact me if I can be of assistance to you.   Following are the goals we discussed today:   Goals Addressed             This Visit's Progress    patient trying to manage her medical needs and manage needs of her spouse       Interventions:  LCSW spoke via phone today with client about her needs  and status Discussed family support. She said she has some support from her daughter and son. They both work during the week.  She said her daughter often helps client obtain needed grocery items for client. She said her daughter and son help with home cleaning and taking out her trash.  Her son lives nearby. Her daughter lives in [place], [place] Discussed medication procurement for client Provided counseling support for client Discussed ambulation of client. She uses a rollator walker to help her walk. She has to take periodic rest breaks when walking. She said use of rollator is helpful to her  Reviewed edema issues. She said she  is wearing compression stockings and that they are helping with edema issues . She wears compression stockings daily Discussed sleeping issues.  She said sometimes she has difficulty sleeping Discussed transport needs. She said she drives her car to and from appointments as needed Discussed client support with PCP. Client sees Blane Ohara MD as PCP. Client has appointment this week at office of PCP Discussed client procuring incontinence supplies. RN had been working with client to locate agency that possibly could help her with bull up briefs supply. RN had mentioned [employer] for client and had given client address and phone number for [employer]. Client informed LCSW today that she went recently to an agency in [place], [place] and obtained 4 packs of pull up briefs. These supplies were free to client. She said she can go one time a month for these supplies  These supplies are  very helpful to client and help client with monthly financial costs for client Discussed vision of client. Client wears glasses to help with vision Client said when she uses rollator she sometimes has to take periodic rest breaks Reminded client of program support with RN, LCSW, Pharmacist Encouraged client to call LCSW as needed for SW support at [phone] [facility] client for phone call with LCSW today. Remas was appreciative of call from LCSW today         Our next appointment is by telephone on 10/08/23 at 2:30 PM   Please call the care guide team at [phone] if you need to cancel or reschedule your appointment.   If you are experiencing a Mental Health or Behavioral Health Crisis or need someone to talk to, please go to [facility] [address], [place] [phone])   The patient verbalized understanding of instructions, educational materials, and care plan provided today and DECLINED offer to receive copy of patient instructions, educational materials, and care plan.   The patient has been provided with contact information for the care management team and has been advised to call with any health related questions or concerns.    Lorna Few  MSW, LCSW [place] Licensed Clinical Social Worker Direct Dial:  [phone] Fax:  [phone] Website:  [email or web address]

## 2023-09-03 NOTE — Patient Outreach (Signed)
 Care Coordination   Follow Up Visit Note   09/03/2023 Name: Alexandra [name] MRN: [id number] DOB: [birth year]  Alexandra [name] is a 83 y.o. year old female who sees Cox, Alexandra Mandes, MD for primary care. I spoke with  Alexandra [name] by phone today.  What matters to the patients health and wellness today?  patient trying to manage her medical needs and manage needs of her spouse     Goals Addressed             This Visit's Progress    patient trying to manage her medical needs and manage needs of her spouse       Interventions:  LCSW spoke via phone today with client about her needs  and status Discussed family support. She said she has some support from her daughter and son. They both work during the week.  She said her daughter often helps client obtain needed grocery items for client. She said her daughter and son help with home cleaning and taking out her trash.  Her son lives nearby. Her daughter lives in [place], [place] Discussed medication procurement for client Provided counseling support for client Discussed ambulation of client. She uses a rollator walker to help her walk. She has to take periodic rest breaks when walking. She said use of rollator is helpful to her  Reviewed edema issues. She said she  is wearing compression stockings and that they are helping with edema issues . She wears compression stockings daily Discussed sleeping issues.  She said sometimes she has difficulty sleeping Discussed transport needs. She said she drives her car to and from appointments as needed Discussed client support with PCP. Client sees Alexandra Ohara MD as PCP. Client has appointment this week at office of PCP Discussed client procuring incontinence supplies. RN had been working with client to locate agency that possibly could help her with bull up briefs supply. RN had mentioned [employer] for client and had given client address and phone number for [employer]. Client informed LCSW today that she  went recently to an agency in [place], [place] and obtained 4 packs of pull up briefs. These supplies were free to client. She said she can go one time a month for these supplies  These supplies are very helpful to client and help client with monthly financial costs for client Discussed vision of client. Client wears glasses to help with vision Client said when she uses rollator she sometimes has to take periodic rest breaks Reminded client of program support with RN, LCSW, Pharmacist Encouraged client to call LCSW as needed for SW support at [phone] [facility] client for phone call with LCSW today. Alexandra [name] was appreciative of call from LCSW today         SDOH assessments and interventions completed:  Yes  SDOH Interventions Today    Flowsheet Row Most Recent Value  SDOH Interventions   Financial Strain Interventions Other (Comment)  [has some finanical challenges]  Physical Activity Interventions Other (Comments)  [uses a walker to help her walk]  Stress Interventions Provide Counseling  [client has stress in managing medical needs]        Care Coordination Interventions:  Yes, provided   Interventions Today    Flowsheet Row Most Recent Value  Chronic Disease   Chronic disease during today's visit Other  [spoke with client about client needs]  General Interventions   General Interventions Discussed/Reviewed General Interventions Discussed, Psychiatrist Interventions   Education Provided Provided Education  Provided Verbal Education On [employer]  Mental Health Interventions   Mental Health Discussed/Reviewed Coping Strategies  Nutrition Interventions   Nutrition Discussed/Reviewed Nutrition Discussed  Pharmacy Interventions   Pharmacy Dicussed/Reviewed Pharmacy Topics Discussed  Safety Interventions   Safety Discussed/Reviewed Fall Risk        Follow up plan: Follow up call scheduled for 10/08/23 at 2:30 PM     Encounter Outcome:  Patient  Visit Completed    Alexandra [name]  MSW, LCSW [place] Licensed Clinical Social Worker Direct Dial:  [phone] Fax:  [phone] Website:  [email or web address]

## 2023-09-06 ENCOUNTER — Ambulatory Visit

## 2023-09-06 VITALS — BP 158/82

## 2023-09-06 DIAGNOSIS — I110 Hypertensive heart disease with heart failure: Secondary | ICD-10-CM

## 2023-09-06 MED ORDER — ONDANSETRON 4 MG PO TBDP: 4.0000 mg | ORAL_TABLET | Freq: Three times a day (TID) | ORAL | 1 refills | Status: DC | PRN

## 2023-09-06 NOTE — Progress Notes (Signed)
 Patient came in for BP recheck today and blood pressure was still elevated at 162/72 and rechecked ten minutes later and it was 158/82. Consulted with Dr. Faylene Kurtz due to Dr. Sedalia Muta being out of the office and she stated that the patient needs to increase her Hydralazine 25 mg to 1 tablet 3 times daily instead of 2 times daily. Also stated patient would need to keep a log of her BP readings for the next week and then return back next Thursday for recheck BP and to bring those readings with her. Patient also stated that she has been nauseous and its been hard to eat. Spoke with Dr. Faylene Kurtz and per her send Zofran 4 mg 1 tablet up to 3 times daily . Sent to pharmacy. Patient understood verbally and scheduled Nurse visit to follow up.

## 2023-09-12 ENCOUNTER — Ambulatory Visit

## 2023-09-12 NOTE — Progress Notes (Signed)
 Patient is in office today for a nurse visit for Blood Pressure Check. Patient blood pressure was 130/70, Patient No chest pain, No shortness of breath, No dyspnea on exertion, No orthopnea, No paroxysmal nocturnal dyspnea, No edema, No palpitations, No syncope  Patient does mention that she has nausea to the point of dry heaving. She stated she was prescribed medication for it however it has not helped. She also stated she has had watery stools x 2 weeks. She states tat she is concerned because of history of cancer, (only has one kidney).  Patient requests a MRI.  BP's taken at home range from: Systolic 106-169 Diastolic 59-80 HR 87-116

## 2023-09-13 ENCOUNTER — Ambulatory Visit (INDEPENDENT_AMBULATORY_CARE_PROVIDER_SITE_OTHER)

## 2023-09-13 VITALS — BP 126/64 | HR 95 | Temp 98.2°F | Resp 16 | Ht 62.0 in | Wt 144.0 lb

## 2023-09-13 DIAGNOSIS — R197 Diarrhea, unspecified: Secondary | ICD-10-CM | POA: Insufficient documentation

## 2023-09-13 DIAGNOSIS — R1030 Lower abdominal pain, unspecified: Secondary | ICD-10-CM | POA: Diagnosis not present

## 2023-09-13 DIAGNOSIS — R110 Nausea: Secondary | ICD-10-CM | POA: Diagnosis not present

## 2023-09-13 NOTE — Progress Notes (Signed)
 Subjective:  Patient ID: Alexandra [name], female    DOB: [birth year]  Age: 83 y.o. MRN: [id number]  Chief Complaint  Patient presents with   Abdominal Pain   Diarrhea    Discussed the use of AI scribe software for clinical note transcription with the patient, who gave verbal consent to proceed.    Alexandra [name] is a 83 year old female with chronic kidney disease who presents with diarrhea and abdominal pain.  She has been experiencing watery bowel movements for almost two weeks, starting third week of March, 2025. The bowel movements occur three to four times a day and are described as 'brown liquid, mostly old water slimy stuff' without blood or foul smell. She experiences significant abdominal pain, particularly in the lower abdomen, and attributes the onset of symptoms to eating nuts. There has been no improvement in her symptoms. No vomiting or blood in stool. She experiences nausea described as 'heaving' without vomiting. Her appetite is poor, and she is not eating well. She denies any recent colonoscopy, noting it has been 23 years since her last one.  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 Recent blood work from August 23, 2023, indicated a decline in kidney function, with a filtration rate of 13 and a creatinine level of 3.3, compared to a previous level of 2.8. She has not been discussed for dialysis yet.  She has anemia, likely related to her kidney disease, with a hemoglobin level of 9.38.  She lives with her husband, who is 39 years old and requires her care. She drinks a lot of water but has no control over her bladder, necessitating the use of pads. No cough or trouble breathing but notes difficulty walking and breathing.     02/26/2023    9:17 AM 01/25/2023    9:34 AM 01/07/2023    1:38 PM 09/17/2022    8:59 AM 08/29/2022    2:30 PM  Depression screen PHQ 2/9  Decreased Interest 0 0 0 0 0  Down, Depressed, Hopeless 0 0 0 0 0  PHQ - 2  Score 0 0 0 0 0  Altered sleeping 0 0     Tired, decreased energy 0 0     Change in appetite 0 0     Feeling bad or failure about yourself  0 0     Trouble concentrating 0 0     Moving slowly or fidgety/restless 0 0     Suicidal thoughts 0 0     PHQ-9 Score 0 0     Difficult doing work/chores Not difficult at all Not difficult at all           02/26/2023    9:04 AM  Fall Risk   Falls in the past year? 0  Number falls in past yr: 0  Injury with Fall? 0  Risk for fall due to : No Fall Risks  Follow up Falls evaluation completed;Education provided    Patient Care Team: Blane Ohara, MD as PCP - General (Internal Medicine) Loni Muse, MD (Inactive) as Consulting Physician (Internal Medicine) Bufford Buttner, MD as Consulting Physician (Nephrology) Debroah Baller, MD as Consulting Physician (Urology) Dulce Sellar Iline Oven, MD as Consulting Physician (Cardiology) Gabriel Carina, [facility] (Inactive) (Pharmacist)   Review of Systems  Constitutional:  Negative for chills, fatigue and fever.  HENT:  Negative for congestion, ear pain and sore throat.   Respiratory:  Positive for shortness of breath. Negative  for cough.   Cardiovascular:  Negative for chest pain and palpitations.  Gastrointestinal:  Positive for abdominal distention, abdominal pain, diarrhea, nausea and vomiting. Negative for constipation.  Endocrine: Negative for polydipsia, polyphagia and polyuria.  Genitourinary:  Negative for difficulty urinating and dysuria.  Musculoskeletal:  Negative for arthralgias, back pain and myalgias.  Skin:  Negative for rash.  Neurological:  Negative for headaches.  Psychiatric/Behavioral:  Negative for dysphoric mood. The patient is not nervous/anxious.     Current Outpatient Medications on File Prior to Visit  Medication Sig Dispense Refill   amLODipine (NORVASC) 10 MG tablet TAKE 1 TABLET(10 MG) BY MOUTH DAILY 90 tablet 1   atorvastatin (LIPITOR) 40 MG tablet Take 1 tablet (40 mg  total) by mouth daily. Per ccm 90 tablet 1   Budeson-Glycopyrrol-Formoterol (BREZTRI AEROSPHERE) 160-9-4.8 MCG/ACT AERO Inhale 2 puffs into the lungs in the morning and at bedtime. 10.7 g 4   Cholecalciferol (VITAMIN D) 125 MCG (5000 UT) CAPS Take 1 capsule by mouth daily.     cyanocobalamin (VITAMIN B12) 1000 MCG/ML injection Inject 1 mL (1,000 mcg total) into the muscle every 14 (fourteen) days. 4 mL 1   hydrALAZINE (APRESOLINE) 25 MG tablet Take 1 tablet (25 mg total) by mouth 2 (two) times daily. (Patient taking differently: Take 25 mg by mouth 3 (three) times daily.) 60 tablet 2   levothyroxine (SYNTHROID) 100 MCG tablet Take 1 tablet (100 mcg total) by mouth daily. 90 tablet 0   melatonin 1 MG TABS tablet Take 1 tablet (1 mg total) by mouth at bedtime. 30 tablet 1   nitroGLYCERIN (NITROSTAT) 0.4 MG SL tablet Place 1 tablet (0.4 mg total) under the tongue every 5 (five) minutes as needed for chest pain. 30 tablet 3   ondansetron (ZOFRAN-ODT) 4 MG disintegrating tablet Take 1 tablet (4 mg total) by mouth every 8 (eight) hours as needed for nausea or vomiting. 20 tablet 1   rOPINIRole (REQUIP) 2 MG tablet TAKE 1 TABLET(2 MG) BY MOUTH TWICE DAILY 60 tablet 2   torsemide (DEMADEX) 20 MG tablet Take 1 tablet (20 mg total) by mouth daily as needed (edema). 30 tablet 2   VENTOLIN HFA 108 (90 Base) MCG/ACT inhaler Inhale 1-2 puffs into the lungs every 6 (six) hours as needed for wheezing or shortness of breath. 18 g 2   ferrous sulfate 325 (65 FE) MG tablet Take 1 tablet (325 mg total) by mouth daily. 90 tablet 0   No current facility-administered medications on file prior to visit.   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 83 yo   BACK SURGERY  2016   lower L 4 to L 5   bladder tack and uterus lift  10/10/2015   CHOLECYSTECTOMY     summer of 2022   CYSTOSCOPY WITH RETROGRADE PYELOGRAM, URETEROSCOPY AND STENT PLACEMENT Right 08/04/2022   Procedure: CYSTOSCOPY WITH RIGHT URETERAL STENT EXCHANGE;  Surgeon: Despina Arias, MD;  Location: [facility] OR;  Service: Urology;  Laterality: Right;  IR RADIOLOGIST EVAL & MGMT  12/03/2018   IR RADIOLOGIST EVAL & MGMT  12/31/2018   IR REMOVAL TUN ACCESS W/ PORT W/O FL MOD SED  08/08/2022   PARATHYROIDECTOMY Left 07/02/2016   Procedure: LEFT INFERIOR PARATHYROIDECTOMY;  Surgeon: Darnell Level, MD;  Location: [facility] ORS;  Service: General;  Laterality: Left;   SHOULDER OPEN ROTATOR CUFF REPAIR  12/2019   TEE WITHOUT CARDIOVERSION N/A 08/10/2022   Procedure: TRANSESOPHAGEAL ECHOCARDIOGRAM (TEE);  Surgeon: Chrystie Nose, MD;  Location: [facility] ENDOSCOPY;  Service: Cardiovascular;  Laterality: N/A;   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    Family History  Problem Relation Age of Onset   Emphysema Mother    Hyperlipidemia Sister    Hypertension Sister    COPD Sister    Hyperlipidemia Sister    Hypertension Sister    Lymphoma Sister    Obesity Brother    Hyperlipidemia Brother    Hypertension Brother    Breast cancer Neg Hx    Social History   Socioeconomic History   Marital status: Married    Spouse name: Molly Maduro   Number of children: 2   Years of education: 11   Highest education level: 11th grade  Occupational History   Occupation: Retired  Tobacco Use   Smoking status: Former    Current packs/day: 0.00    Average packs/day: 1 pack/day for 50.0 years (50.0 ttl pk-yrs)    Types: Cigarettes    Start date: 06/12/1955    Quit date: 06/11/2005    Years since quitting: 18.2    Smokeless tobacco: Never  Vaping Use   Vaping status: Never Used  Substance and Sexual Activity   Alcohol use: No   Drug use: No   Sexual activity: Yes    Partners: Male  Other Topics Concern   Not on file  Social History Narrative   Not on file   Social Drivers of Health   Financial Resource Strain: Medium Risk (09/03/2023)   Overall Financial Resource Strain (CARDIA)    Difficulty of Paying Living Expenses: Somewhat hard  Food Insecurity: No Food Insecurity (07/23/2023)   Hunger Vital Sign    Worried About Running Out of Food in the Last Year: Never true    Ran Out of Food in the Last Year: Never true  Transportation Needs: No Transportation Needs (07/23/2023)   PRAPARE - Administrator, Civil Service (Medical): No    Lack of Transportation (Non-Medical): No  Physical Activity: Inactive (09/03/2023)   Exercise Vital Sign    Days of Exercise per Week: 0 days    Minutes of Exercise per Session: 0 min  Stress: Stress Concern Present (09/03/2023)   [employer] of Occupational Health - Occupational Stress Questionnaire    Feeling of Stress : To some extent  Social Connections: Moderately Integrated (02/26/2023)   Social Connection and Isolation Panel [NHANES]    Frequency of Communication with Friends and Family: More than three times a week    Frequency of Social Gatherings with Friends and Family: More than three times a week    Attends Religious Services: More than 4 times per year    Active Member of [employer] or Organizations: No    Attends Engineer, structural: Never    Marital Status: Married    Objective:  BP 126/64   Pulse 95   Temp 98.2 F (36.8 C)   Resp 16   Ht 5\' 2"  (1.575 m)   Wt 144 lb (  65.3 kg)   SpO2 94%   BMI 26.34 kg/m      09/13/2023    9:45 AM 09/12/2023    9:19 AM 09/06/2023    9:20 AM  BP/Weight  Systolic BP 126 130 158  Diastolic BP 64 70 82  Wt. (Lbs) 144    BMI 26.34 kg/m2      Physical Exam Vitals and nursing  note reviewed.  Constitutional:      General: She is not in acute distress.    Appearance: She is not ill-appearing, toxic-appearing or diaphoretic.  HENT:     Head: Normocephalic and atraumatic.  Cardiovascular:     Rate and Rhythm: Normal rate and regular rhythm.  Pulmonary:     Effort: Pulmonary effort is normal.     Breath sounds: Normal breath sounds.  Abdominal:     Comments: ABDOMEN: Hyperactive bowel sounds. Mild right upper quadrant tenderness. Mild epigastric discomfort. Left upper quadrant tenderness. Left lower quadrant tenderness. Suprapubic and lower abdominal tenderness. No guarding or rigidity. EXTREMITIES: No edema.  Skin:    General: Skin is warm.  Neurological:     General: No focal deficit present.     Mental Status: She is alert.     Diabetic Foot Exam - Simple   No data filed      Lab Results  Component Value Date   WBC 11.9 (H) 08/23/2023   HGB 9.3 (L) 08/23/2023   HCT 28.6 (L) 08/23/2023   PLT 372 08/23/2023   GLUCOSE 89 08/23/2023   CHOL 120 08/23/2023   TRIG 81 08/23/2023   HDL 43 08/23/2023   LDLCALC 61 08/23/2023   ALT 12 08/23/2023   AST 19 08/23/2023   NA 139 08/23/2023   K 5.0 08/23/2023   CL 105 08/23/2023   CREATININE 3.33 (H) 08/23/2023   BUN 53 (H) 08/23/2023   CO2 18 (L) 08/23/2023   TSH 4.350 07/16/2023   INR 1.1 08/04/2022   HGBA1C 5.3 05/31/2023      Assessment & Plan:  Assessment and Plan       Acute diarrhea Assessment & Plan: Presents with two weeks of watery bowel movements, abdominal pain, and nausea following nut consumption. No blood in stool, but hyperactive bowel sounds and tenderness in multiple abdominal quadrants.   Differential diagnosis includes infection, inflammatory bowel disease, or malignancy. Blood and stool tests are prioritized to rule out infection. Colonoscopy considered if necessary but she had not a single colonoscopy in about 23 years, may consider if prolonged symptoms, primarily to  rule out cancer or other causes. - Order blood and stool tests to rule out infection - Advise probiotic pills or probiotic-rich yogurt - Recommend eating half a banana daily to alleviate symptoms - Advise maintaining hydration - Instruct to seek emergency care if pain or discomfort worsens  Orders: -     CBC with Differential/Platelet -     Comprehensive metabolic panel with GFR -     Lipase -     Amylase -     Sedimentation rate -     Cdiff NAA+O+P+Stool Culture  Nausea -     CBC with Differential/Platelet -     Comprehensive metabolic panel with GFR -     Lipase -     Amylase -     Sedimentation rate -     Cdiff NAA+O+P+Stool Culture  Lower abdominal pain Assessment & Plan: Stage 4 Chronic Kidney Disease Single right kidney with decreased function. Glomerular filtration rate of 13  and creatinine level of 3.3 indicate stage 4 chronic kidney disease. Management focuses on maintaining current function.  Anemia secondary to Chronic Kidney Disease Anemia likely secondary to chronic kidney disease, with a hemoglobin level of 9.38.  Urinary Incontinence Reports lack of bladder control and uses pads for urinary incontinence.  Follow-up Follow-up based on test results and symptom progression. - Follow up with blood and stool test results to determine need for CT scan or colonoscopy - Provide printed orders for lab tests  Orders: -     CBC with Differential/Platelet -     Comprehensive metabolic panel with GFR -     Lipase -     Amylase -     Sedimentation rate -     Cdiff NAA+O+P+Stool Culture     No orders of the defined types were placed in this encounter.   Orders Placed This Encounter  Procedures   Cdiff NAA+O+P+Stool Culture   CBC with Differential/Platelet   Comprehensive metabolic panel with GFR   Lipase   Amylase   Sedimentation Rate     Follow-up: Return if symptoms worsen or fail to improve.  An After Visit Summary was printed and given to the  patient.  Windell Moment, MD [facility] [phone]

## 2023-09-13 NOTE — Assessment & Plan Note (Signed)
 Stage 4 Chronic Kidney Disease Single right kidney with decreased function. Glomerular filtration rate of 13 and creatinine level of 3.3 indicate stage 4 chronic kidney disease. Management focuses on maintaining current function.  Anemia secondary to Chronic Kidney Disease Anemia likely secondary to chronic kidney disease, with a hemoglobin level of 9.38.  Urinary Incontinence Reports lack of bladder control and uses pads for urinary incontinence.  Follow-up Follow-up based on test results and symptom progression. - Follow up with blood and stool test results to determine need for CT scan or colonoscopy - Provide printed orders for lab tests

## 2023-09-13 NOTE — Patient Instructions (Signed)
 VISIT SUMMARY:  You came in today because you have been experiencing watery bowel movements and abdominal pain for almost two weeks. You also reported nausea and poor appetite. We discussed your chronic kidney disease and anemia, and you mentioned your difficulty with bladder control.  YOUR PLAN:  -CHRONIC DIARRHEA: Chronic diarrhea means having frequent, watery bowel movements for an extended period. We will conduct blood and stool tests to check for infections. You should take probiotic pills (FLORAGEN OVER THE COUNTER) or eat probiotic-rich yogurt SUCH AS ACTIVIA, and eat half a banana daily to help with your symptoms. Stay hydrated and seek emergency care if your pain or discomfort worsens.  -STAGE 4 CHRONIC KIDNEY DISEASE: Stage 4 chronic kidney disease means your kidneys are severely damaged and not working well. We will focus on maintaining your current kidney function.  -ANEMIA SECONDARY TO CHRONIC KIDNEY DISEASE: Anemia means you have a lower than normal number of red blood cells, which is likely due to your kidney disease. We will monitor your condition closely.  -URINARY INCONTINENCE: Urinary incontinence means you have difficulty controlling your bladder. You mentioned using pads to manage this.  INSTRUCTIONS:  Please follow up with [place] once you have the blood and stool test results so we can determine if you need a CT scan or colonoscopy. You have been given printed orders for these lab tests.

## 2023-09-13 NOTE — Assessment & Plan Note (Signed)
 Presents with two weeks of watery bowel movements, abdominal pain, and nausea following nut consumption. No blood in stool, but hyperactive bowel sounds and tenderness in multiple abdominal quadrants.   Differential diagnosis includes infection, inflammatory bowel disease, or malignancy. Blood and stool tests are prioritized to rule out infection. Colonoscopy considered if necessary but she had not a single colonoscopy in about 23 years, may consider if prolonged symptoms, primarily to rule out cancer or other causes. - Order blood and stool tests to rule out infection - Advise probiotic pills or probiotic-rich yogurt - Recommend eating half a banana daily to alleviate symptoms - Advise maintaining hydration - Instruct to seek emergency care if pain or discomfort worsens

## 2023-09-14 LAB — COMPREHENSIVE METABOLIC PANEL WITH GFR
ALT: 8 IU/L (ref 0–32)
AST: 14 IU/L (ref 0–40)
Albumin: 3.5 g/dL — ABNORMAL LOW (ref 3.8–4.8)
Alkaline Phosphatase: 109 IU/L (ref 44–121)
BUN/Creatinine Ratio: 12 (ref 12–28)
BUN: 50 mg/dL — ABNORMAL HIGH (ref 8–27)
Bilirubin Total: 0.2 mg/dL (ref 0.0–1.2)
CO2: 19 mmol/L — ABNORMAL LOW (ref 20–29)
Calcium: 8.3 mg/dL — ABNORMAL LOW (ref 8.7–10.3)
Chloride: 105 mmol/L (ref 96–106)
Creatinine, Ser: 4.24 mg/dL — ABNORMAL HIGH (ref 0.57–1.00)
Globulin, Total: 2.2 g/dL (ref 1.5–4.5)
Glucose: 90 mg/dL (ref 70–99)
Potassium: 5.6 mmol/L — ABNORMAL HIGH (ref 3.5–5.2)
Sodium: 139 mmol/L (ref 134–144)
Total Protein: 5.7 g/dL — ABNORMAL LOW (ref 6.0–8.5)
eGFR: 10 mL/min/{1.73_m2} — ABNORMAL LOW (ref >59)

## 2023-09-14 LAB — CBC WITH DIFFERENTIAL/PLATELET
Basophils Absolute: 0 10*3/uL (ref 0.0–0.2)
Basos: 0 %
EOS (ABSOLUTE): 0.2 10*3/uL (ref 0.0–0.4)
Eos: 1 %
Hematocrit: 26 % — ABNORMAL LOW (ref 34.0–46.6)
Hemoglobin: 8.3 g/dL — ABNORMAL LOW (ref 11.1–15.9)
Immature Grans (Abs): 0.1 10*3/uL (ref 0.0–0.1)
Immature Granulocytes: 1 %
Lymphocytes Absolute: 2.4 10*3/uL (ref 0.7–3.1)
Lymphs: 20 %
MCH: 30.3 pg (ref 26.6–33.0)
MCHC: 31.9 g/dL (ref 31.5–35.7)
MCV: 95 fL (ref 79–97)
Monocytes Absolute: 1 10*3/uL — ABNORMAL HIGH (ref 0.1–0.9)
Monocytes: 9 %
Neutrophils Absolute: 8.2 10*3/uL — ABNORMAL HIGH (ref 1.4–7.0)
Neutrophils: 69 %
Platelets: 424 10*3/uL (ref 150–450)
RBC: 2.74 x10E6/uL — CL (ref 3.77–5.28)
RDW: 13.2 % (ref 11.7–15.4)
WBC: 11.9 10*3/uL — ABNORMAL HIGH (ref 3.4–10.8)

## 2023-09-14 LAB — AMYLASE: Amylase: 60 U/L (ref 31–110)

## 2023-09-14 LAB — LIPASE: Lipase: 30 U/L (ref 14–85)

## 2023-09-14 LAB — SEDIMENTATION RATE: Sed Rate: 15 mm/h (ref 0–40)

## 2023-09-15 ENCOUNTER — Telehealth: Payer: Self-pay

## 2023-09-15 NOTE — Telephone Encounter (Signed)
 Please advice Copied from CRM [id number]. Topic: Clinical - Medical Advice >> Sep 13, 2023  2:09 PM Tiffany S wrote: Reason for CRM: Lab Alert  RBC 2.74  Alexandra [name] [phone] option 1

## 2023-09-16 ENCOUNTER — Other Ambulatory Visit

## 2023-09-16 ENCOUNTER — Other Ambulatory Visit: Payer: Self-pay

## 2023-09-16 DIAGNOSIS — E875 Hyperkalemia: Secondary | ICD-10-CM

## 2023-09-16 DIAGNOSIS — N185 Chronic kidney disease, stage 5: Secondary | ICD-10-CM

## 2023-09-16 NOTE — Telephone Encounter (Signed)
 Patient informed, appt scheduled, lab order placed.

## 2023-09-17 LAB — COMPREHENSIVE METABOLIC PANEL WITH GFR
ALT: 10 IU/L (ref 0–32)
AST: 17 IU/L (ref 0–40)
Albumin: 3.5 g/dL — ABNORMAL LOW (ref 3.8–4.8)
Alkaline Phosphatase: 109 IU/L (ref 44–121)
BUN/Creatinine Ratio: 13 (ref 12–28)
BUN: 56 mg/dL — ABNORMAL HIGH (ref 8–27)
Bilirubin Total: 0.2 mg/dL (ref 0.0–1.2)
CO2: 16 mmol/L — ABNORMAL LOW (ref 20–29)
Calcium: 8.6 mg/dL — ABNORMAL LOW (ref 8.7–10.3)
Chloride: 104 mmol/L (ref 96–106)
Creatinine, Ser: 4.2 mg/dL — ABNORMAL HIGH (ref 0.57–1.00)
Globulin, Total: 2.4 g/dL (ref 1.5–4.5)
Glucose: 120 mg/dL — ABNORMAL HIGH (ref 70–99)
Potassium: 5.5 mmol/L — ABNORMAL HIGH (ref 3.5–5.2)
Sodium: 140 mmol/L (ref 134–144)
Total Protein: 5.9 g/dL — ABNORMAL LOW (ref 6.0–8.5)
eGFR: 10 mL/min/{1.73_m2} — ABNORMAL LOW (ref >59)

## 2023-09-20 LAB — CDIFF NAA+O+P+STOOL CULTURE
E coli, Shiga toxin Assay: NEGATIVE
Toxigenic C. Difficile by PCR: NEGATIVE

## 2023-10-02 ENCOUNTER — Ambulatory Visit: Payer: Self-pay

## 2023-10-02 ENCOUNTER — Encounter: Payer: Self-pay | Admitting: Family Medicine

## 2023-10-02 ENCOUNTER — Ambulatory Visit (INDEPENDENT_AMBULATORY_CARE_PROVIDER_SITE_OTHER): Admitting: Family Medicine

## 2023-10-02 VITALS — BP 136/82 | HR 93 | Temp 98.2°F | Ht 62.0 in | Wt 142.0 lb

## 2023-10-02 DIAGNOSIS — N185 Chronic kidney disease, stage 5: Secondary | ICD-10-CM | POA: Diagnosis not present

## 2023-10-02 DIAGNOSIS — K5901 Slow transit constipation: Secondary | ICD-10-CM | POA: Diagnosis not present

## 2023-10-02 NOTE — Telephone Encounter (Signed)
 Copied from CRM [id number]. Topic: Clinical - Red Word Triage >> Oct 02, 2023  7:36 AM Rosamond Comes wrote: Red Word that prompted transfer to Nurse Triage: patient calling in, bowel problem and pain, hurts to walk. Chief Complaint: constipation Symptoms: hard, small round balls of stools, abd bloating, abd pain 10/10, rectal pain 5/10 & pressure Frequency: ongoing & worsening Pertinent Negatives: Patient denies blood in stool Disposition: [] ED /[] Urgent Care (no appt availability in office) / [x] Appointment(In office/virtual)/ []  [facility] [place] [facility] [facility]/ [] Home Care/ [] Refused Recommended Disposition /[] [place] [facility]/ []  Follow-up with PCP Additional Notes: middle bottom of abd pain hurts to walk 10/10 , rectal pain and pressure. Pt reports pressure on bottom/rectal area at all times.  Reason for Disposition  Abdomen is more swollen than usual    Pt has hx of constipation: reports severe constipation with abd bloating, pain & pressure in rectal.  Answer Assessment - Initial Assessment Questions 1. STOOL PATTERN OR FREQUENCY: "How often do you have a bowel movement (BM)?"  (Normal range: 3 times a day to every 3 days)  "When was your last BM?"       unknown 2. STRAINING: "Do you have to strain to have a BM?"      yes 3. RECTAL PAIN: "Does your rectum hurt when the stool comes out?" If Yes, ask: "Do you have hemorrhoids? How bad is the pain?"  (Scale 1-10; or mild, moderate, severe)     10/10 when pressing down  4. STOOL COMPOSITION: "Are the stools hard?"      Hard, ball 5. BLOOD ON STOOLS: "Has there been any blood on the toilet tissue or on the surface of the BM?" If Yes, ask: "When was the last time?"     no 6. CHRONIC CONSTIPATION: "Is this a new problem for you?"  If No, ask: "How long have you had this problem?" (days, weeks, months)      yes 7. CHANGES IN DIET OR HYDRATION: "Have there been any recent changes in your diet?" "How much fluids are you drinking on a daily basis?"   "How much have you had to drink today?"     Drinking plenty of water  8. MEDICINES: "Have you been taking any new medicines?" "Are you taking any narcotic pain medicines?" (e.g., Dilaudid , morphine , Percocet, Vicodin)     N/a 9. LAXATIVES: "Have you been using any stool softeners, laxatives, or enemas?"  If Yes, ask "What, how often, and when was the last time?"     MOM 10. ACTIVITY:  "How much walking do you do every day?"  "Has your activity level decreased in the past week?"        Moderate  11. CAUSE: "What do you think is causing the constipation?"        unknown 12. OTHER SYMPTOMS: "Do you have any other symptoms?" (e.g., abdomen pain, bloating, fever, vomiting)       Abd bloating 13. MEDICAL HISTORY: "Do you have a history of hemorrhoids, rectal fissures, or rectal surgery or rectal abscess?"         N/a 14. PREGNANCY: "Is there any chance you are pregnant?" "When was your last menstrual period?"       N/a  Protocols used: Constipation-A-AH

## 2023-10-02 NOTE — Patient Instructions (Signed)
 Recommend miralax  (generic is fine.) one dose twice a day. May adjust if starts having diarrhea.

## 2023-10-02 NOTE — Assessment & Plan Note (Signed)
 Recommend miralax  (generic is fine.) one dose twice a day. May adjust if starts having diarrhea.

## 2023-10-02 NOTE — Assessment & Plan Note (Signed)
 Limits which medicine can be used for constipation.

## 2023-10-02 NOTE — Progress Notes (Signed)
 Acute Office Visit  Subjective:    Patient ID: Alexandra [name], female    DOB: May 23, 1941, 83 y.o.   MRN: [id number]  Chief Complaint  Patient presents with   Constipation    HPI: Patient is in today for constipation/abd pain. States she took MOM last night and patient had a BM this morning. States her abdomen hurts when she sits down and has a lot of pressure (anal area not bladder.) New. No fevers. Nausea. No vomiting.  Patient was seen 3-4 weeks ago and had diarrhea. Work up was negative. Immodium taken twice.   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 83 yo   BACK SURGERY  2016   lower L 4 to L 5   bladder tack and uterus lift  10/10/2015   CHOLECYSTECTOMY     summer of 2022   CYSTOSCOPY WITH RETROGRADE PYELOGRAM, URETEROSCOPY AND STENT PLACEMENT Right 08/04/2022   Procedure: CYSTOSCOPY WITH RIGHT URETERAL STENT EXCHANGE;  Surgeon: Mallie Seal, MD;  Location: [facility] OR;  Service: Urology;  Laterality: Right;   IR RADIOLOGIST EVAL & MGMT  12/03/2018   IR RADIOLOGIST EVAL & MGMT  12/31/2018   IR REMOVAL TUN ACCESS W/ PORT W/O FL MOD SED  08/08/2022   PARATHYROIDECTOMY Left 07/02/2016   Procedure: LEFT INFERIOR PARATHYROIDECTOMY;  Surgeon: Oralee Billow, MD;  Location: [facility] ORS;  Service: General;  Laterality: Left;   SHOULDER OPEN ROTATOR CUFF REPAIR  12/2019   TEE WITHOUT CARDIOVERSION N/A 08/10/2022   Procedure: TRANSESOPHAGEAL ECHOCARDIOGRAM (TEE);  Surgeon: Hazle Lites, MD;  Location: [facility] ENDOSCOPY;   Service: Cardiovascular;  Laterality: N/A;   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    Family History  Problem Relation Age of Onset   Emphysema Mother    Hyperlipidemia Sister    Hypertension Sister    COPD Sister    Hyperlipidemia Sister    Hypertension Sister    Lymphoma Sister    Obesity Brother    Hyperlipidemia Brother    Hypertension Brother    Breast cancer Neg Hx     Social History   Socioeconomic History   Marital status: Married    Spouse name: Porfirio Bristol   Number of children: 2   Years of education: 11   Highest education level: 11th grade  Occupational History   Occupation: Retired  Tobacco Use   Smoking status: Former    Current packs/day: 0.00    Average packs/day: 1 pack/day for 50.0 years (50.0 ttl pk-yrs)    Types: Cigarettes    Start date: 06/12/1955    Quit date: 06/11/2005    Years since quitting: 18.3   Smokeless tobacco: Never  Vaping Use   Vaping status: Never Used  Substance and Sexual Activity   Alcohol use: No   Drug use: No   Sexual activity: Yes    Partners: Male  Other Topics Concern   Not on file  Social History Narrative   Not on file   Social Drivers  of Health   Financial Resource Strain: Medium Risk (09/03/2023)   Overall Financial Resource Strain (CARDIA)    Difficulty of Paying Living Expenses: Somewhat hard  Food Insecurity: No Food Insecurity (07/23/2023)   Hunger Vital Sign    Worried About Running Out of Food in the Last Year: Never true    Ran Out of Food in the Last Year: Never true  Transportation Needs: No Transportation Needs (07/23/2023)   PRAPARE - Administrator, Civil Service (Medical): No    Lack of Transportation (Non-Medical): No  Physical Activity: Inactive (09/03/2023)   Exercise Vital Sign    Days of Exercise per Week: 0 days    Minutes of Exercise per Session: 0 min  Stress: Stress Concern Present (09/03/2023)   [employer] of Occupational Health - Occupational Stress Questionnaire     Feeling of Stress : To some extent  Social Connections: Moderately Integrated (02/26/2023)   Social Connection and Isolation Panel [NHANES]    Frequency of Communication with Friends and Family: More than three times a week    Frequency of Social Gatherings with Friends and Family: More than three times a week    Attends Religious Services: More than 4 times per year    Active Member of [employer] or Organizations: No    Attends Banker Meetings: Never    Marital Status: Married  Catering manager Violence: Not At Risk (07/23/2023)   Humiliation, Afraid, Rape, and Kick questionnaire    Fear of Current or Ex-Partner: No    Emotionally Abused: No    Physically Abused: No    Sexually Abused: No    Outpatient Medications Prior to Visit  Medication Sig Dispense Refill   amLODipine  (NORVASC ) 10 MG tablet TAKE 1 TABLET(10 MG) BY MOUTH DAILY 90 tablet 1   atorvastatin  (LIPITOR) 40 MG tablet Take 1 tablet (40 mg total) by mouth daily. Per ccm 90 tablet 1   Budeson-Glycopyrrol-Formoterol  (BREZTRI  AEROSPHERE) 160-9-4.8 MCG/ACT AERO Inhale 2 puffs into the lungs in the morning and at bedtime. 10.7 g 4   Cholecalciferol (VITAMIN D) 125 MCG (5000 UT) CAPS Take 1 capsule by mouth daily.     cyanocobalamin  (VITAMIN B12) 1000 MCG/ML injection Inject 1 mL (1,000 mcg total) into the muscle every 14 (fourteen) days. 4 mL 1   ferrous sulfate  325 (65 FE) MG tablet Take 1 tablet (325 mg total) by mouth daily. 90 tablet 0   hydrALAZINE  (APRESOLINE ) 25 MG tablet Take 1 tablet (25 mg total) by mouth 2 (two) times daily. (Patient taking differently: Take 25 mg by mouth 3 (three) times daily.) 60 tablet 2   levothyroxine  (SYNTHROID ) 100 MCG tablet Take 1 tablet (100 mcg total) by mouth daily. 90 tablet 0   melatonin 1 MG TABS tablet Take 1 tablet (1 mg total) by mouth at bedtime. 30 tablet 1   nitroGLYCERIN  (NITROSTAT ) 0.4 MG SL tablet Place 1 tablet (0.4 mg total) under the tongue every 5 (five) minutes as  needed for chest pain. 30 tablet 3   ondansetron  (ZOFRAN -ODT) 4 MG disintegrating tablet Take 1 tablet (4 mg total) by mouth every 8 (eight) hours as needed for nausea or vomiting. 20 tablet 1   rOPINIRole  (REQUIP ) 2 MG tablet TAKE 1 TABLET(2 MG) BY MOUTH TWICE DAILY 60 tablet 2   torsemide  (DEMADEX ) 20 MG tablet Take 1 tablet (20 mg total) by mouth daily as needed (edema). 30 tablet 2   VENTOLIN  HFA 108 (90 Base) MCG/ACT inhaler Inhale 1-2  puffs into the lungs every 6 (six) hours as needed for wheezing or shortness of breath. 18 g 2   No facility-administered medications prior to visit.    Allergies  Allergen Reactions   Contrast Media [Iodinated Contrast Media] Hives, Itching and Rash   Omeprazole  Itching   Shellfish Allergy Nausea And Vomiting   Shellfish-Derived Products Nausea And Vomiting    Review of Systems  Constitutional:  Negative for chills, fatigue and fever.  Gastrointestinal:  Positive for abdominal pain and constipation. Negative for diarrhea and nausea.  Neurological:  Negative for dizziness and headaches.       Objective:        10/02/2023   10:11 AM 09/13/2023    9:45 AM 09/12/2023    9:19 AM  Vitals with BMI  Height 5\' 2"  5\' 2"    Weight 142 lbs 144 lbs   BMI 25.97 26.33   Systolic 136 126 811  Diastolic 82 64 70  Pulse 93 95     No data found.   Physical Exam Vitals reviewed.  Constitutional:      Appearance: Normal appearance. She is normal weight.  Cardiovascular:     Rate and Rhythm: Normal rate and regular rhythm.     Heart sounds: Normal heart sounds.  Pulmonary:     Effort: Pulmonary effort is normal. No respiratory distress.     Breath sounds: Normal breath sounds.  Abdominal:     General: Abdomen is flat. Bowel sounds are normal.     Palpations: Abdomen is soft.     Tenderness: There is abdominal tenderness (very mild generalized.).  Neurological:     Mental Status: She is alert.  Psychiatric:        Mood and Affect: Mood normal.         Behavior: Behavior normal.     Health Maintenance Due  Topic Date Due   Zoster Vaccines- Shingrix (2 of 2) 10/12/2021    There are no preventive care reminders to display for this patient.   Lab Results  Component Value Date   TSH 4.350 07/16/2023   Lab Results  Component Value Date   WBC 11.9 (H) 09/13/2023   HGB 8.3 (L) 09/13/2023   HCT 26.0 (L) 09/13/2023   MCV 95 09/13/2023   PLT 424 09/13/2023   Lab Results  Component Value Date   NA 140 09/16/2023   K 5.5 (H) 09/16/2023   CO2 16 (L) 09/16/2023   GLUCOSE 120 (H) 09/16/2023   BUN 56 (H) 09/16/2023   CREATININE 4.20 (H) 09/16/2023   BILITOT 0.2 09/16/2023   ALKPHOS 109 09/16/2023   AST 17 09/16/2023   ALT 10 09/16/2023   PROT 5.9 (L) 09/16/2023   ALBUMIN  3.5 (L) 09/16/2023   CALCIUM  8.6 (L) 09/16/2023   ANIONGAP 9 08/13/2022   EGFR 10 (L) 09/16/2023   Lab Results  Component Value Date   CHOL 120 08/23/2023   Lab Results  Component Value Date   HDL 43 08/23/2023   Lab Results  Component Value Date   LDLCALC 61 08/23/2023   Lab Results  Component Value Date   TRIG 81 08/23/2023   Lab Results  Component Value Date   CHOLHDL 2.8 08/23/2023   Lab Results  Component Value Date   HGBA1C 5.3 05/31/2023       Assessment & Plan:  Slow transit constipation Assessment & Plan: Recommend miralax  (generic is fine.) one dose twice a day. May adjust if starts having diarrhea.  Chronic kidney disease, stage V (HCC) Assessment & Plan: Limits which medicine can be used for constipation.      No orders of the defined types were placed in this encounter.   No orders of the defined types were placed in this encounter.    Follow-up: Return if symptoms worsen or fail to improve.  An After Visit Summary was printed and given to the patient.  Mercy Stall, MD [facility] [phone]

## 2023-10-07 ENCOUNTER — Ambulatory Visit: Payer: Self-pay | Admitting: Licensed Clinical Social Worker

## 2023-10-07 NOTE — Patient Outreach (Signed)
 Complex Care Management   Visit Note  10/07/2023  Name:  Alexandra [name] MRN: [id number] DOB: 08/24/1940  Situation: Referral received for Complex Care Management related to  client management of daily care needs and of client medical needs  I obtained verbal consent from Patient.  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 Eye appointment scheduled for 10/09/23. Hearing loss    Cardiovascular  HTN Weight: 142 lb (64.4 kg)  Respiratory    COPD; CHF  Endocrine  Acquired hypothyroidism     Gastrointestinal  No issues mentioned       Genitourinary    Stress incontinence  Integumentary    Unable to assess  Musculoskeletal    Fall risk; uses rollator walker to help her walk  Fatigue issues occasionally    Psychosocial    Financial challenges; needs help with food procurement; needs help with cleaning in the home; fall risk; some sleeping issues; some vision challenges          02/26/2023    9:17 AM  Depression screen PHQ 2/9  Decreased Interest 0  Down, Depressed, Hopeless 0  PHQ - 2 Score 0  Altered sleeping 0  Tired, decreased energy 0  Change in appetite 0  Feeling bad or failure about yourself  0  Trouble concentrating 0  Moving slowly or fidgety/restless 0  Suicidal thoughts 0  PHQ-9 Score 0  Difficult doing work/chores Not difficult at all    Vitals:  Within normal range per  client information   Medications Reviewed Today     Reviewed by Afton Horse (Social Worker) on 10/07/23 at 1544  Med List Status: <None>   Medication Order Taking? Sig Documenting Provider Last Dose Status Informant  amLODipine  (NORVASC ) 10 MG tablet [id number] No TAKE 1 TABLET(10 MG) BY MOUTH DAILY Cox, Kirsten, MD Taking Active   atorvastatin  (LIPITOR) 40 MG tablet [id number] No Take 1 tablet (40 mg total) by mouth daily. Per ccm Cox, Burleigh Carp, MD Taking Active   Budeson-Glycopyrrol-Formoterol  (BREZTRI  AEROSPHERE) 160-9-4.8 MCG/ACT AERO [id number] No Inhale 2 puffs into the lungs in the morning and at bedtime. Mercy Stall, MD Taking Active Self           Med Note Auburn Leak Mar 04, 2023  8:59 AM) Via PAP as of 03/04/23 review    Cholecalciferol (VITAMIN D) 125 MCG (5000 UT) CAPS [id number] No Take 1 capsule by mouth daily. [provider] Taking Active Self  cyanocobalamin  (VITAMIN B12) 1000 MCG/ML injection [id number] No Inject 1 mL (1,000 mcg total) into the muscle every 14 (fourteen) days. CoxBurleigh Carp, MD Taking Active   ferrous sulfate  325 (65 FE) MG tablet [id number] No Take 1 tablet (325 mg total) by mouth daily. Janece Means, FNP Taking Expired 09/02/23 2359   hydrALAZINE  (APRESOLINE ) 25 MG tablet [id number] No Take  1 tablet (25 mg total) by mouth 2 (two) times daily.  Patient taking differently: Take 25 mg by mouth 3 (three) times daily.   Cox, Kirsten, MD Taking Active   levothyroxine  (SYNTHROID ) 100 MCG tablet [id number] No Take 1 tablet (100 mcg total) by mouth daily. Janece Means, FNP Taking Expired 09/13/23 2359   melatonin 1 MG TABS tablet [id number] No Take 1 tablet (1 mg total) by mouth at bedtime. Odilia Bennett, PA Taking Active   nitroGLYCERIN  (NITROSTAT ) 0.4 MG SL tablet [id number] No Place 1 tablet (0.4 mg total) under the tongue every 5 (five) minutes as needed for chest pain. Cox, Kirsten, MD Taking Active            Med Note [facility], [place]  A   Thu Aug 08, 2023  1:06 PM) Patient reports she has a new bottle but has not needed  ondansetron  (ZOFRAN -ODT) 4 MG disintegrating tablet [id number] No Take 1 tablet (4 mg total) by mouth every 8 (eight) hours as needed for nausea or vomiting. Sirivol, Mamatha, MD Taking Active   rOPINIRole  (REQUIP ) 2 MG tablet [id number] No TAKE 1 TABLET(2 MG) BY MOUTH TWICE DAILY Cox, Kirsten, MD Taking Active   torsemide  (DEMADEX ) 20 MG tablet [id number] No Take 1 tablet (20 mg total) by mouth daily as needed (edema). CoxBurleigh Carp, MD Taking Active            Med Note [facility], [place] A   Thu Aug 08, 2023  1:09 PM) Patient states she hasn't needed because she has no swelling. States she knows to use it if needed  VENTOLIN  HFA 108 (90 Base) MCG/ACT inhaler [id number] No Inhale 1-2 puffs into the lungs every 6 (six) hours as needed for wheezing or shortness of breath. Mercy Stall, MD Taking Active Self           Med Note (HARPER, CATHERINE T   Tue Jan 08, 2023  3:05 PM)              Recommendation:   PCP Follow-up Client to attend appointment with Nephrologist as scheduled on Oct 15, 2023 Client to attend eye appointment scheduled for 10/09/23 Client to take medications as prescribed Client to call LCSW as needed for SW support for client Client to eat meals as scheduled and allow time to obtain needed rest Client to drive to appointments as she is able Client to talk with her adult children as needed about her ongoing needs  Follow Up Plan:   LCSW to call client on 11/25/23 at 9:30 AM   Alexandria Angel  MSW, LCSW [place]/[facility] Based [facility] Licensed Clinical Social Worker Direct Dial:  [phone] Fax:  [phone] Website:  [email or web address]

## 2023-10-07 NOTE — Patient Instructions (Signed)
 Visit Information  Thank you for taking time to visit with me today. Please don't hesitate to contact me if I can be of assistance to you before our next scheduled appointment.  Our next appointment is by telephone on 11/25/23 at 9:30 AM   Please call the care guide team at [phone] if you need to cancel or reschedule your appointment.   Following is a copy of your care plan:   Goals Addressed             This Visit's Progress    VBCI RN Care Plan       Problems:  Pain issues with right hand and wrist (recent surgery) Needs help with obtaining groceries Needs help with some home cleaning  Difficulty with sleep  Goal: Over the next 30 days the Patient will attend all scheduled medical appointments:   as evidenced by client report and as documented in EPIC record               Over next 30 days client will meet with Nephrologist as scheduled (10/15/23) to discuss kidney needs of client AEB patient report of attending scheduled appointments  Interventions:  Spoke with client about eye appointment for client on 10/09/23 Discussed client transport needs Discussed client recent procedure on right wrist (Carpal Tunnel procedure) Discussed client support from her son and from her daughter Discussed medication procurement Discussed food provision of client Discussed program support for client, RN, Pharmacist, LCSW Encouraged client to attend scheduled medical appointments as able Encouraged Naisha to call LCSW as needed for SW support at [phone]   Patient Self-Care Activities:  Attend all scheduled provider appointments Perform all self care activities independently  Take medications as prescribed         Call LCSW as needed for SW support  Plan:  Telephone follow up appointment with care management team member scheduled for:  11/25/23 at 9:30 AM          Please go to [facility] [address], [place] [phone]) if you  are experiencing a Mental Health or Behavioral Health Crisis or need someone to talk to.  The patient verbalized understanding of instructions, educational materials, and care plan provided today and DECLINED offer to receive copy of patient instructions, educational materials, and care plan.   Patient was provided information on how to contact Care Team as needed. LCSW gave client LCSW name and phone number and encouraged Merelene to call LCSW as needed for SW support at [phone]  Alexandria Angel  MSW, LCSW /[facility] Licensed Clinical Social Worker Direct Dial:  [phone] Fax:  [phone] Website:  [email or web address]

## 2023-10-13 ENCOUNTER — Other Ambulatory Visit: Payer: Self-pay | Admitting: Family Medicine

## 2023-10-13 DIAGNOSIS — E782 Mixed hyperlipidemia: Secondary | ICD-10-CM

## 2023-10-19 ENCOUNTER — Other Ambulatory Visit: Payer: Self-pay | Admitting: Family Medicine

## 2023-10-19 DIAGNOSIS — G2581 Restless legs syndrome: Secondary | ICD-10-CM

## 2023-11-10 LAB — BASIC METABOLIC PANEL WITH GFR
BUN: 58 — AB (ref 4–21)
CO2: 15 (ref 13–22)
Chloride: 112 — AB (ref 99–108)
Creatinine: 3.4 — AB (ref 0.5–1.1)
Glucose: 104
Potassium: 5.1 meq/L (ref 3.5–5.1)
Sodium: 138 (ref 137–147)

## 2023-11-10 LAB — CBC AND DIFFERENTIAL
HCT: 27 — AB (ref 36–46)
Hemoglobin: 8.5 — AB (ref 12.0–16.0)
Platelets: 476 10*3/uL — AB (ref 150–400)
WBC: 9.8

## 2023-11-10 LAB — COMPREHENSIVE METABOLIC PANEL WITH GFR
Albumin: 3.9 (ref 3.5–5.0)
Calcium: 8.4 — AB (ref 8.7–10.7)
eGFR: 13

## 2023-11-10 LAB — HEPATIC FUNCTION PANEL
ALT: 12 U/L (ref 7–35)
AST: 20 (ref 13–35)
Alkaline Phosphatase: 123 (ref 25–125)

## 2023-11-10 LAB — CBC: RBC: 2.83 — AB (ref 3.87–5.11)

## 2023-11-11 ENCOUNTER — Ambulatory Visit (INDEPENDENT_AMBULATORY_CARE_PROVIDER_SITE_OTHER): Admitting: Family Medicine

## 2023-11-11 ENCOUNTER — Encounter: Payer: Self-pay | Admitting: Family Medicine

## 2023-11-11 VITALS — BP 130/72 | HR 87 | Temp 98.1°F | Resp 16 | Ht 62.0 in | Wt 139.6 lb

## 2023-11-11 DIAGNOSIS — M25551 Pain in right hip: Secondary | ICD-10-CM

## 2023-11-11 DIAGNOSIS — E611 Iron deficiency: Secondary | ICD-10-CM | POA: Diagnosis not present

## 2023-11-11 MED ORDER — FERROUS SULFATE 325 (65 FE) MG PO TABS: 325.0000 mg | ORAL_TABLET | Freq: Every day | ORAL | 0 refills | Status: DC

## 2023-11-11 NOTE — Assessment & Plan Note (Signed)
 Iron deficiency anemia with hemoglobin at 8.5 g/dL. History of severe anemia requiring transfusions. Not currently on iron supplements. - Prescribed ferrous sulfate  325 mg daily, sent to [facility]. - Scheduled follow-up for hemoglobin check and iron studies.

## 2023-11-11 NOTE — Assessment & Plan Note (Signed)
 Recent hospitalization 11/10/23  Arthritis with recent hip pain improved by methocarbamol.  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 - Continue methocarbamol 500 mg TID. - Monitor for hip pain recurrence and reassess if symptoms return.

## 2023-11-11 NOTE — Progress Notes (Signed)
 0   Subjective:  Patient ID: Alexandra [name], female    DOB: [birth year]  Age: 83 y.o. MRN: [id number]  Chief Complaint  Patient presents with   Hospitalization Follow-up    ANEMIA    Discussed the use of AI scribe software for clinical note transcription with the patient, who gave verbal consent to proceed.  History of Present Illness   Alexandra [name] is an 83 year old female with iron deficiency anemia and a history of bladder cancer who presents with hip pain.  She has been experiencing hip pain, which led her to seek medical attention. During her hospital visit, she was administered four pills, including a muscle relaxer, which significantly alleviated her symptoms. She is currently taking methocarbamol 500 mg three times a day for pain management. 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 She is not experiencing any hip pain today.  She has a history of iron deficiency anemia, with a recent hemoglobin level of 8.5, which is low. She has previously required blood transfusions, particularly after the removal of one of her kidneys. She is prescribed iron supplements, specifically ferrous sulfate , but is currently out of this medication. She notes that some of her medications make her feel sick. Denies chest pain or shortness of breath.   She has a history of bladder cancer, which has resulted in significant bladder dysfunction. She underwent chemotherapy and radiation, which shrunk her bladder, leading to loss of bladder control. She manages this condition with the use of pull-ups and pads.        02/26/2023    9:17 AM 01/25/2023    9:34 AM 01/07/2023    1:38 PM 09/17/2022    8:59 AM 08/29/2022    2:30 PM  Depression screen PHQ 2/9  Decreased Interest 0 0 0 0 0  Down, Depressed, Hopeless 0 0 0 0 0  PHQ - 2 Score 0 0 0 0 0  Altered sleeping 0 0     Tired, decreased energy 0 0     Change in appetite 0 0     Feeling bad or failure about yourself  0 0     Trouble  concentrating 0 0     Moving slowly or fidgety/restless 0 0     Suicidal thoughts 0 0     PHQ-9 Score 0 0     Difficult doing work/chores Not difficult at all Not difficult at all           02/26/2023    9:04 AM  Fall Risk   Falls in the past year? 0  Number falls in past yr: 0  Injury with Fall? 0  Risk for fall due to : No Fall Risks  Follow up Falls evaluation completed;Education provided    Patient Care Team: Mercy Stall, MD as PCP - General (Internal Medicine) Monroe Hammans, MD (Inactive) as Consulting Physician (Internal Medicine) Leandra Pro, MD as Consulting Physician (Nephrology) Wally Gunnels, MD as Consulting Physician (Urology) Sandee Crook Margart Shears, MD as Consulting Physician (Cardiology) Noelia Batman, [facility] (Inactive) (Pharmacist) Gaile Jourdain Larene Pleasant, LCSW as [facility] Care Management (Licensed Clinical Social Worker)   Review of Systems  Constitutional:  Negative for chills, diaphoresis, fatigue and fever.  HENT:  Negative for congestion, ear pain and sinus pain.   Eyes: Negative.   Respiratory:  Negative for cough and shortness of breath.   Cardiovascular:  Negative for chest pain.  Gastrointestinal:  Negative for abdominal pain, constipation, diarrhea, nausea and  vomiting.  Endocrine: Negative.   Genitourinary:  Negative for dysuria, flank pain and urgency.  Musculoskeletal:  Positive for arthralgias (right hip pain, feeling much better). Negative for myalgias.  Allergic/Immunologic: Negative.   Neurological:  Negative for weakness and headaches.  Hematological: Negative.   Psychiatric/Behavioral:  Negative for dysphoric mood. The patient is not nervous/anxious.     Current Outpatient Medications on File Prior to Visit  Medication Sig Dispense Refill   amLODipine  (NORVASC ) 10 MG tablet TAKE 1 TABLET(10 MG) BY MOUTH DAILY 90 tablet 1   atorvastatin  (LIPITOR) 40 MG tablet TAKE 1 TABLET BY MOUTH EVERY DAY 90 tablet 1   Budeson-Glycopyrrol-Formoterol   (BREZTRI  AEROSPHERE) 160-9-4.8 MCG/ACT AERO Inhale 2 puffs into the lungs in the morning and at bedtime. 10.7 g 4   Cholecalciferol (VITAMIN D) 125 MCG (5000 UT) CAPS Take 1 capsule by mouth daily.     cyanocobalamin  (VITAMIN B12) 1000 MCG/ML injection Inject 1 mL (1,000 mcg total) into the muscle every 14 (fourteen) days. 4 mL 1   hydrALAZINE  (APRESOLINE ) 25 MG tablet Take 1 tablet (25 mg total) by mouth 2 (two) times daily. (Patient taking differently: Take 25 mg by mouth 3 (three) times daily.) 60 tablet 2   nitroGLYCERIN  (NITROSTAT ) 0.4 MG SL tablet Place 1 tablet (0.4 mg total) under the tongue every 5 (five) minutes as needed for chest pain. 30 tablet 3   ondansetron  (ZOFRAN -ODT) 4 MG disintegrating tablet Take 1 tablet (4 mg total) by mouth every 8 (eight) hours as needed for nausea or vomiting. 20 tablet 1   rOPINIRole  (REQUIP ) 2 MG tablet TAKE 1 TABLET(2 MG) BY MOUTH TWICE DAILY 60 tablet 2   torsemide  (DEMADEX ) 20 MG tablet Take 1 tablet (20 mg total) by mouth daily as needed (edema). 30 tablet 2   VENTOLIN  HFA 108 (90 Base) MCG/ACT inhaler Inhale 1-2 puffs into the lungs every 6 (six) hours as needed for wheezing or shortness of breath. 18 g 2   levothyroxine  (SYNTHROID ) 100 MCG tablet Take 1 tablet (100 mcg total) by mouth daily. 90 tablet 0   No current facility-administered medications on file prior to visit.   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 83 yo   BACK SURGERY  2016   lower L 4 to L 5   bladder tack  and uterus lift  10/10/2015   CHOLECYSTECTOMY     summer of 2022   CYSTOSCOPY WITH RETROGRADE PYELOGRAM, URETEROSCOPY AND STENT PLACEMENT Right 08/04/2022   Procedure: CYSTOSCOPY WITH RIGHT URETERAL STENT EXCHANGE;  Surgeon: Mallie Seal, MD;  [facility];  Service: Urology;  Laterality: Right;   IR RADIOLOGIST EVAL & MGMT  12/03/2018   IR RADIOLOGIST EVAL & MGMT  12/31/2018   IR REMOVAL TUN ACCESS W/ PORT W/O FL MOD SED  08/08/2022   PARATHYROIDECTOMY Left 07/02/2016   Procedure: LEFT INFERIOR PARATHYROIDECTOMY;  Surgeon: Oralee Billow, MD;  Location: [facility] ORS;  Service: General;  Laterality: Left;   SHOULDER OPEN ROTATOR CUFF REPAIR  12/2019  TEE WITHOUT CARDIOVERSION N/A 08/10/2022   Procedure: TRANSESOPHAGEAL ECHOCARDIOGRAM (TEE);  Surgeon: Hazle Lites, MD;  Location: [facility] ENDOSCOPY;  Service: Cardiovascular;  Laterality: N/A;   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    Family History  Problem Relation Age of Onset   Emphysema Mother    Hyperlipidemia Sister    Hypertension Sister    COPD Sister    Hyperlipidemia Sister    Hypertension Sister    Lymphoma Sister    Obesity Brother    Hyperlipidemia Brother    Hypertension Brother    Breast cancer Neg Hx    Social History   Socioeconomic History   Marital status: Married    Spouse name: Porfirio Bristol   Number of children: 2   Years of education: 11   Highest education level: 11th grade  Occupational History   Occupation: Retired  Tobacco Use   Smoking status: Former    Current packs/day: 0.00    Average packs/day: 1 pack/day for 50.0 years (50.0 ttl pk-yrs)    Types: Cigarettes    Start date: 06/12/1955    Quit date: 06/11/2005    Years since quitting: 18.4   Smokeless tobacco: Never  Vaping Use   Vaping status: Never Used  Substance and Sexual Activity   Alcohol use: No   Drug use: No   Sexual activity: Yes    Partners: Male  Other Topics Concern   Not on file  Social History Narrative   Not on file    Social Drivers of Health   Financial Resource Strain: Medium Risk (09/03/2023)   Overall Financial Resource Strain (CARDIA)    Difficulty of Paying Living Expenses: Somewhat hard  Food Insecurity: No Food Insecurity (10/07/2023)   Hunger Vital Sign    Worried About Running Out of Food in the Last Year: Never true    Ran Out of Food in the Last Year: Never true  Transportation Needs: No Transportation Needs (10/07/2023)   PRAPARE - Administrator, Civil Service (Medical): No    Lack of Transportation (Non-Medical): No  Physical Activity: Inactive (10/07/2023)   Exercise Vital Sign    Days of Exercise per Week: 0 days    Minutes of Exercise per Session: 0 min  Stress: Stress Concern Present (10/07/2023)   [employer] of Occupational Health - Occupational Stress Questionnaire    Feeling of Stress : To some extent  Social Connections: Moderately Integrated (02/26/2023)   Social Connection and Isolation Panel [NHANES]    Frequency of Communication with Friends and Family: More than three times a week    Frequency of Social Gatherings with Friends and Family: More than three times a week    Attends Religious Services: More than 4 times per year    Active Member of [employer] or Organizations: No    Attends Engineer, structural: Never    Marital Status: Married    Objective:  BP 130/72   Pulse 87   Temp 98.1 F (36.7 C) (Temporal)   Resp 16   Ht 5\' 2"  (1.575 m)   Wt 139 lb 9.6 oz (63.3 kg)   SpO2 99%   BMI 25.53 kg/m      11/11/2023    8:31 AM 10/07/2023    3:41 PM 10/02/2023   10:11 AM  BP/Weight  Systolic BP 130 -- 136  Diastolic BP 72 -- 82  Wt. (Lbs) 139.6 142 142  BMI 25.53 kg/m2 25.97 kg/m2 25.97 kg/m2  Physical Exam Vitals reviewed.  Constitutional:      General: She is not in acute distress.    Appearance: Normal appearance.  Eyes:     Conjunctiva/sclera: Conjunctivae normal.  Cardiovascular:     Rate and Rhythm: Normal rate and regular  rhythm.     Heart sounds: Normal heart sounds. No murmur heard. Pulmonary:     Effort: Pulmonary effort is normal.     Breath sounds: Normal breath sounds. No wheezing.  Skin:    General: Skin is warm.  Neurological:     Mental Status: She is alert. Mental status is at baseline.  Psychiatric:        Mood and Affect: Mood normal.        Behavior: Behavior normal.     Lab Results  Component Value Date   WBC 9.8 11/10/2023   HGB 8.5 (A) 11/10/2023   HCT 27 (A) 11/10/2023   PLT 476 (A) 11/10/2023   GLUCOSE 120 (H) 09/16/2023   CHOL 120 08/23/2023   TRIG 81 08/23/2023   HDL 43 08/23/2023   LDLCALC 61 08/23/2023   ALT 12 11/10/2023   AST 20 11/10/2023   NA 138 11/10/2023   K 5.1 11/10/2023   CL 112 (A) 11/10/2023   CREATININE 3.4 (A) 11/10/2023   BUN 58 (A) 11/10/2023   CO2 15 11/10/2023   TSH 4.350 07/16/2023   INR 1.1 08/04/2022   HGBA1C 5.3 05/31/2023    Assessment & Plan:  Right hip pain Assessment & Plan: Recent hospitalization 11/10/23  Arthritis with recent hip pain improved by methocarbamol.  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 - Continue methocarbamol 500 mg TID. - Monitor for hip pain recurrence and reassess if symptoms return.   Iron deficiency Assessment & Plan: Iron deficiency anemia with hemoglobin at 8.5 g/dL. History of severe anemia requiring transfusions. Not currently on iron supplements. - Prescribed ferrous sulfate  325 mg daily, sent to [facility]. - Scheduled follow-up for hemoglobin check and iron studies.  Orders: -     Ferrous Sulfate ; Take 1 tablet (325 mg total) by mouth daily.  Dispense: 90 tablet; Refill: 0     Meds ordered this encounter  Medications   ferrous sulfate  325 (65 FE) MG tablet    Sig: Take 1 tablet (325 mg total) by mouth daily.    Dispense:  90 tablet    Refill:  0    Orders Placed This Encounter  Procedures   CBC and differential   CBC   Basic metabolic panel with GFR   Comprehensive metabolic panel  with GFR   Hepatic function panel       Follow-up: Return in about 17 days (around 11/28/2023) for keep appointment with Dr. Reinhold Carbine, needs labs checked.  An After Visit Summary was printed and given to the patient.  Delford Felling, FNP [facility] [phone]

## 2023-11-13 ENCOUNTER — Ambulatory Visit: Payer: Self-pay

## 2023-11-13 NOTE — Telephone Encounter (Signed)
 FYI Only or Action Required?: FYI only for provider  Patient was last seen in primary care on 11/11/2023 by Janece Means, FNP. Called Nurse Triage reporting Hip Pain. Symptoms began several days ago. Interventions attempted: prescription medications, OTC meds, heat. Symptoms are: gradually worsening.  Triage Disposition: See PCP When Office is Open (Within 3 Days)  Patient/caregiver understands and will follow disposition?: Yes, will follow disposition  Copied from CRM [id number]. Topic: Clinical - Red Word Triage >> Nov 13, 2023  4:44 PM Ethelle Herb L wrote: Red Word that prompted transfer to Nurse Triage: pt still in a lot of pain, seen on 11/11/23 Reason for Disposition  [1] MODERATE pain (e.g., interferes with normal activities, limping) AND [2] present > 3 days  Answer Assessment - Initial Assessment Questions 1. LOCATION and RADIATION: "Where is the pain located?"      R hip pain 2. QUALITY: "What does the pain feel like?"  (e.g., sharp, dull, aching, burning)     Sharp pain 3. SEVERITY: "How bad is the pain?" "What does it keep you from doing?"   (Scale 1-10; or mild, moderate, severe)   -  MILD (1-3): doesn't interfere with normal activities    -  MODERATE (4-7): interferes with normal activities (e.g., work or school) or awakens from sleep, limping    -  SEVERE (8-10): excruciating pain, unable to do any normal activities, unable to walk     10 4. ONSET: "When did the pain start?" "Does it come and go, or is it there all the time?"     About 4 days 5. WORK OR EXERCISE: "Has there been any recent work or exercise that involved this part of the body?"      denies 6. CAUSE: "What do you think is causing the hip pain?"      Was seen and told she has arthritis and anemic 7. AGGRAVATING FACTORS: "What makes the hip pain worse?" (e.g., walking, climbing stairs, running)     Ive tried everything,  8. OTHER SYMPTOMS: "Do you have any other symptoms?" (e.g., back pain, pain shooting down leg,   fever, rash)     Denies injury  Protocols used: Hip Pain-A-AH

## 2023-11-14 ENCOUNTER — Ambulatory Visit (INDEPENDENT_AMBULATORY_CARE_PROVIDER_SITE_OTHER): Admitting: Family Medicine

## 2023-11-14 ENCOUNTER — Encounter: Payer: Self-pay | Admitting: Family Medicine

## 2023-11-14 VITALS — BP 148/88 | HR 94 | Temp 97.5°F | Ht 62.0 in | Wt 139.4 lb

## 2023-11-14 DIAGNOSIS — M25551 Pain in right hip: Secondary | ICD-10-CM

## 2023-11-14 NOTE — Progress Notes (Signed)
 Acute Office Visit  Subjective:    Patient ID: Alexandra [name], female    DOB: [birth year], 83 y.o.   MRN: [id number]  Chief Complaint  Patient presents with   Hip Pain    Right     Discussed the use of AI scribe software for clinical note transcription with the patient, who gave verbal consent to proceed.  History of Present Illness   Alexandra [name] is an 83 year old female with significant arthritis who presents with hip pain.  She experiences significant pain in her hip, specifically in the upper part near her back. The pain is severe enough to cause discomfort when sitting up and has persisted despite taking methylcarbinol three times a day, which is not providing relief.  She has a history of receiving a hip injection a long time ago, which was beneficial. The current episode of pain has been ongoing since Sunday.       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 83 yo   BACK SURGERY  2016   lower L 4 to L 5   bladder tack and uterus lift  10/10/2015   CHOLECYSTECTOMY     summer of 2022   CYSTOSCOPY WITH RETROGRADE PYELOGRAM, URETEROSCOPY AND STENT PLACEMENT Right 08/04/2022   Procedure: CYSTOSCOPY WITH RIGHT URETERAL STENT EXCHANGE;  Surgeon: Mallie Seal, MD;  Location: [facility] OR;  Service: Urology;  Laterality: Right;   IR RADIOLOGIST EVAL & MGMT  12/03/2018   IR RADIOLOGIST EVAL & MGMT  12/31/2018   IR REMOVAL TUN ACCESS W/ PORT W/O FL MOD SED  08/08/2022   PARATHYROIDECTOMY Left 07/02/2016    Procedure: LEFT INFERIOR PARATHYROIDECTOMY;  Surgeon: Oralee Billow, MD;  Location: [facility] ORS;  Service: General;  Laterality: Left;   SHOULDER OPEN ROTATOR CUFF REPAIR  12/2019   TEE WITHOUT CARDIOVERSION N/A 08/10/2022   Procedure: TRANSESOPHAGEAL ECHOCARDIOGRAM (TEE);  Surgeon: Hazle Lites, MD;  Location: [facility] ENDOSCOPY;  Service: Cardiovascular;  Laterality: N/A;   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    Family History  Problem Relation Age of Onset   Emphysema Mother    Hyperlipidemia Sister    Hypertension Sister    COPD Sister    Hyperlipidemia Sister    Hypertension Sister    Lymphoma Sister    Obesity Brother    Hyperlipidemia Brother    Hypertension Brother    Breast cancer Neg Hx     Social History   Socioeconomic History   Marital status: Married    Spouse name: Alexandra [name]   Number of children: 2   Years of education: 11   Highest education level: 11th grade  Occupational History   Occupation: Retired  Tobacco Use   Smoking status: Former    Current packs/day: 0.00    Average packs/day: 1 pack/day for 50.0 years (50.0 ttl pk-yrs)    Types: Cigarettes    Start date: 06/12/1955    Quit date: 06/11/2005    Years since quitting: 18.4  Smokeless tobacco: Never  Vaping Use   Vaping status: Never Used  Substance and Sexual Activity   Alcohol use: No   Drug use: No   Sexual activity: Yes    Partners: Male  Other Topics Concern   Not on file  Social History Narrative   Not on file   Social Drivers of Health   Financial Resource Strain: Medium Risk (09/03/2023)   Overall Financial Resource Strain (CARDIA)    Difficulty of Paying Living Expenses: Somewhat hard  Food Insecurity: No Food Insecurity (10/07/2023)   Hunger Vital Sign    Worried About Running Out of Food in the Last Year: Never true    Ran Out of Food in the Last Year: Never true  Transportation Needs: No Transportation Needs (10/07/2023)   PRAPARE - Administrator, Civil Service  (Medical): No    Lack of Transportation (Non-Medical): No  Physical Activity: Inactive (10/07/2023)   Exercise Vital Sign    Days of Exercise per Week: 0 days    Minutes of Exercise per Session: 0 min  Stress: Stress Concern Present (10/07/2023)   [employer] of Occupational Health - Occupational Stress Questionnaire    Feeling of Stress : To some extent  Social Connections: Moderately Integrated (02/26/2023)   Social Connection and Isolation Panel [NHANES]    Frequency of Communication with Friends and Family: More than three times a week    Frequency of Social Gatherings with Friends and Family: More than three times a week    Attends Religious Services: More than 4 times per year    Active Member of [employer] or Organizations: No    Attends Banker Meetings: Never    Marital Status: Married  Catering manager Violence: Not At Risk (10/07/2023)   Humiliation, Afraid, Rape, and Kick questionnaire    Fear of Current or Ex-Partner: No    Emotionally Abused: No    Physically Abused: No    Sexually Abused: No    Outpatient Medications Prior to Visit  Medication Sig Dispense Refill   amLODipine  (NORVASC ) 10 MG tablet TAKE 1 TABLET(10 MG) BY MOUTH DAILY 90 tablet 1   atorvastatin  (LIPITOR) 40 MG tablet TAKE 1 TABLET BY MOUTH EVERY DAY 90 tablet 1   Cholecalciferol (VITAMIN D) 125 MCG (5000 UT) CAPS Take 1 capsule by mouth daily.     cyanocobalamin  (VITAMIN B12) 1000 MCG/ML injection Inject 1 mL (1,000 mcg total) into the muscle every 14 (fourteen) days. 4 mL 1   ferrous sulfate  325 (65 FE) MG tablet Take 1 tablet (325 mg total) by mouth daily. 90 tablet 0   hydrALAZINE  (APRESOLINE ) 25 MG tablet Take 1 tablet (25 mg total) by mouth 2 (two) times daily. (Patient taking differently: Take 25 mg by mouth 3 (three) times daily.) 60 tablet 2   levothyroxine  (SYNTHROID ) 100 MCG tablet Take 1 tablet (100 mcg total) by mouth daily. 90 tablet 0   nitroGLYCERIN  (NITROSTAT ) 0.4 MG SL tablet  Place 1 tablet (0.4 mg total) under the tongue every 5 (five) minutes as needed for chest pain. 30 tablet 3   ondansetron  (ZOFRAN -ODT) 4 MG disintegrating tablet Take 1 tablet (4 mg total) by mouth every 8 (eight) hours as needed for nausea or vomiting. 20 tablet 1   rOPINIRole  (REQUIP ) 2 MG tablet TAKE 1 TABLET(2 MG) BY MOUTH TWICE DAILY 60 tablet 2   torsemide  (DEMADEX ) 20 MG tablet Take 1 tablet (20 mg total) by mouth daily as needed (edema). 30 tablet  2   VENTOLIN  HFA 108 (90 Base) MCG/ACT inhaler Inhale 1-2 puffs into the lungs every 6 (six) hours as needed for wheezing or shortness of breath. 18 g 2   Budeson-Glycopyrrol-Formoterol  (BREZTRI  AEROSPHERE) 160-9-4.8 MCG/ACT AERO Inhale 2 puffs into the lungs in the morning and at bedtime. 10.7 g 4   No facility-administered medications prior to visit.    Allergies  Allergen Reactions   Contrast Media [Iodinated Contrast Media] Hives, Itching and Rash   Omeprazole  Itching   Shellfish Allergy Nausea And Vomiting   Shellfish-Derived Products Nausea And Vomiting    Review of Systems  Constitutional:  Negative for chills, fatigue and fever.  HENT:  Negative for congestion, ear pain, sinus pain and sore throat.   Respiratory:  Negative for cough and shortness of breath.   Cardiovascular:  Negative for chest pain.  Gastrointestinal:  Negative for abdominal pain, constipation, diarrhea, nausea and vomiting.  Genitourinary:  Negative for dysuria and frequency.  Musculoskeletal:  Positive for arthralgias (right hip pain) and joint swelling.  Neurological:  Negative for dizziness and headaches.  Psychiatric/Behavioral:  Negative for dysphoric mood. The patient is not nervous/anxious.        Objective:         11/14/2023    7:27 AM 11/11/2023    8:31 AM 10/07/2023    3:41 PM  Vitals with BMI  Height 5\' 2"  5\' 2"  5\' 2"   Weight 139 lbs 6 oz 139 lbs 10 oz 142 lbs  BMI 25.49 25.53 25.97  Systolic 148 130 --  Diastolic 88 72 --  Pulse 94 87      No data found.   Physical Exam Vitals reviewed.  Constitutional:      General: She is not in acute distress.    Appearance: Normal appearance.  Eyes:     Conjunctiva/sclera: Conjunctivae normal.  Cardiovascular:     Rate and Rhythm: Normal rate and regular rhythm.     Heart sounds: Normal heart sounds. No murmur heard. Pulmonary:     Effort: Pulmonary effort is normal.     Breath sounds: Normal breath sounds. No wheezing.  Musculoskeletal:     Right hip: Tenderness and bony tenderness present. Decreased range of motion.     Comments: Abnormal gait due to hip pain  Skin:    General: Skin is warm.  Neurological:     Mental Status: She is alert. Mental status is at baseline.  Psychiatric:        Mood and Affect: Mood normal.        Behavior: Behavior normal.     Health Maintenance Due  Topic Date Due   Zoster Vaccines- Shingrix (2 of 2) 10/12/2021    There are no preventive care reminders to display for this patient.   Lab Results  Component Value Date   TSH 4.350 07/16/2023   Lab Results  Component Value Date   WBC 9.8 11/10/2023   HGB 8.5 (A) 11/10/2023   HCT 27 (A) 11/10/2023   MCV 95 09/13/2023   PLT 476 (A) 11/10/2023   Lab Results  Component Value Date   NA 138 11/10/2023   K 5.1 11/10/2023   CO2 15 11/10/2023   GLUCOSE 120 (H) 09/16/2023   BUN 58 (A) 11/10/2023   CREATININE 3.4 (A) 11/10/2023   BILITOT 0.2 09/16/2023   ALKPHOS 123 11/10/2023   AST 20 11/10/2023   ALT 12 11/10/2023   PROT 5.9 (L) 09/16/2023   ALBUMIN  3.9 11/10/2023   CALCIUM  8.4 (  A) 11/10/2023   ANIONGAP 9 08/13/2022   EGFR 13 11/10/2023   Lab Results  Component Value Date   CHOL 120 08/23/2023   Lab Results  Component Value Date   HDL 43 08/23/2023   Lab Results  Component Value Date   LDLCALC 61 08/23/2023   Lab Results  Component Value Date   TRIG 81 08/23/2023   Lab Results  Component Value Date   CHOLHDL 2.8 08/23/2023   Lab Results  Component Value  Date   HGBA1C 5.3 05/31/2023      Joint Injection/Arthrocentesis  Date/Time: 11/14/2023 11:55 AM  Performed by: Janece Means, FNP Authorized by: Janece Means, FNP  Indications: pain  Body area: hip Joint: right hip Local anesthesia used: yes  Anesthesia: Local anesthesia used: yes Anesthetic total (ml): ethyl chloride spray.  Sedation: Patient sedated: no  Needle gauge: 23G. Ultrasound guidance: no Approach: anterior Aspirate: clear Aspirate amount: 0.2 mL Triamcinolone  amount: 40 mg Lidocaine  1% amount: 5 mL Patient tolerance: patient tolerated the procedure well with no immediate complications Comments: Risks and benefits were explained prior to procedure. Verbal consent was given to proceed    Assessment & Plan:  Right hip pain Assessment & Plan: Not improved Arthritis hip pain feels worse since last visit - Administer hip injection, see procedure note - Continue methocarbamol 500 mg TID. - Tylenol  as needed. Max dosage 4000 mg - Monitor for hip pain recurrence and reassess if symptoms return. - If no relief, consider orthopedic referral.   Orders: -     Arthrocentesis     No orders of the defined types were placed in this encounter.   Orders Placed This Encounter  Procedures   Joint Injection/Arthrocentesis      Follow-up: No follow-ups on file.  An After Visit Summary was printed and given to the patient.  Delford Felling, FNP [facility] [phone]

## 2023-11-14 NOTE — Assessment & Plan Note (Addendum)
 Not improved Arthritis hip pain feels worse since last visit - Administer hip injection, see procedure note - Continue methocarbamol 500 mg TID. - Tylenol  as needed. Max dosage 4000 mg - Monitor for hip pain recurrence and reassess if symptoms return. - If no relief, consider orthopedic referral.

## 2023-11-18 ENCOUNTER — Encounter: Payer: Self-pay | Admitting: Family Medicine

## 2023-11-18 ENCOUNTER — Other Ambulatory Visit: Payer: Self-pay | Admitting: Family Medicine

## 2023-11-18 ENCOUNTER — Ambulatory Visit: Payer: Self-pay

## 2023-11-18 ENCOUNTER — Ambulatory Visit (INDEPENDENT_AMBULATORY_CARE_PROVIDER_SITE_OTHER): Admitting: Family Medicine

## 2023-11-18 VITALS — BP 160/80 | HR 64 | Temp 97.2°F | Resp 16 | Ht 62.0 in | Wt 138.0 lb

## 2023-11-18 DIAGNOSIS — K921 Melena: Secondary | ICD-10-CM

## 2023-11-18 DIAGNOSIS — D631 Anemia in chronic kidney disease: Secondary | ICD-10-CM

## 2023-11-18 DIAGNOSIS — M25551 Pain in right hip: Secondary | ICD-10-CM

## 2023-11-18 DIAGNOSIS — I110 Hypertensive heart disease with heart failure: Secondary | ICD-10-CM

## 2023-11-18 DIAGNOSIS — N3001 Acute cystitis with hematuria: Secondary | ICD-10-CM

## 2023-11-18 DIAGNOSIS — I5033 Acute on chronic diastolic (congestive) heart failure: Secondary | ICD-10-CM | POA: Insufficient documentation

## 2023-11-18 DIAGNOSIS — N185 Chronic kidney disease, stage 5: Secondary | ICD-10-CM | POA: Diagnosis not present

## 2023-11-18 DIAGNOSIS — I10 Essential (primary) hypertension: Secondary | ICD-10-CM

## 2023-11-18 DIAGNOSIS — R197 Diarrhea, unspecified: Secondary | ICD-10-CM | POA: Diagnosis not present

## 2023-11-18 DIAGNOSIS — R10812 Left upper quadrant abdominal tenderness: Secondary | ICD-10-CM

## 2023-11-18 MED ORDER — HYDRALAZINE HCL 50 MG PO TABS: 50.0000 mg | ORAL_TABLET | Freq: Three times a day (TID) | ORAL | 2 refills | Status: DC

## 2023-11-18 NOTE — Assessment & Plan Note (Addendum)
 Possible UTI, unable to void  - patient to bring in urine sample to evaluate and treat as needed.

## 2023-11-18 NOTE — Assessment & Plan Note (Addendum)
 Acute blood, frank blood noted Possible differentials include hemorrhoids, GI bleed, diverticulosis flare, ischemic or infectious colitis, or malignancy. - referral to GI - recent stool studies showed no infectious agent

## 2023-11-18 NOTE — Assessment & Plan Note (Signed)
 Persistent hip pain not alleviated by current medication regimen. - referral to ortho for further evaluation and treatment

## 2023-11-18 NOTE — Telephone Encounter (Signed)
 FYI Only or Action Required?: FYI only for provider  Patient was last seen in primary care on 11/14/2023 by Janece Means, FNP. Called Nurse Triage reporting Diarrhea. Symptoms began 3 weeks ago. Interventions attempted: Rest, hydration, or home remedies. Symptoms are: gradually worsening.  Triage Disposition: See HCP Within 4 Hours (Or PCP Triage)  Patient/caregiver understands and will follow disposition?: Yes          Reason for Disposition  [1] SEVERE diarrhea (e.g., 7 or more times / day more than normal) AND [2] age > 60 years  Answer Assessment - Initial Assessment Questions 1. DIARRHEA SEVERITY: "How bad is the diarrhea?" "How many more stools have you had in the past 24 hours than normal?"    - NO DIARRHEA (SCALE 0)   - MILD (SCALE 1-3): Few loose or mushy BMs; increase of 1-3 stools over normal daily number of stools; mild increase in ostomy output.   -  MODERATE (SCALE 4-7): Increase of 4-6 stools daily over normal; moderate increase in ostomy output.   -  SEVERE (SCALE 8-10; OR "WORST POSSIBLE"): Increase of 7 or more stools daily over normal; moderate increase in ostomy output; incontinence.     "More than 10" 2. ONSET: "When did the diarrhea begin?"      X 3 weeks ago 3. BM CONSISTENCY: "How loose or watery is the diarrhea?"      Watery 4. VOMITING: "Are you also vomiting?" If Yes, ask: "How many times in the past 24 hours?"      None 5. ABDOMEN PAIN: "Are you having any abdomen pain?" If Yes, ask: "What does it feel like?" (e.g., crampy, dull, intermittent, constant)      Lower abdomen 6. ABDOMEN PAIN SEVERITY: If present, ask: "How bad is the pain?"  (e.g., Scale 1-10; mild, moderate, or severe)   - MILD (1-3): doesn't interfere with normal activities, abdomen soft and not tender to touch    - MODERATE (4-7): interferes with normal activities or awakens from sleep, abdomen tender to touch    - SEVERE (8-10): excruciating pain, doubled over, unable to do any normal  activities       10/10,intermittent 7. ORAL INTAKE: If vomiting, "Have you been able to drink liquids?" "How much liquids have you had in the past 24 hours?"    Eating and drinking normally 8. HYDRATION: "Any signs of dehydration?" (e.g., dry mouth [not just dry lips], too weak to stand, dizziness, new weight loss) "When did you last urinate?"     None 10. 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 OTHER SYMPTOMS: "Do you have any other symptoms?" (e.g., fever, blood in stool)       None  Protocols used: Diarrhea-A-AH

## 2023-11-18 NOTE — Progress Notes (Signed)
 Acute Office Visit  Subjective:    Patient ID: Alexandra [name], female    DOB: August 30, 1940, 83 y.o.   MRN: [id number]  Chief Complaint  Patient presents with   Diarrhea   Discussed the use of AI scribe software for clinical note transcription with the patient, who gave verbal consent to proceed.  History of Present Illness   Alexandra [name] is an 83 year old female who presents with diarrhea for over four months, which got better and has gotten worse the last three weeks and blood in stool.  She experiences diarrhea characterized by loose and watery stools, with a sensation of needing to pass a large stool but only passing watery material. She has had accidents due to the diarrhea and manages this by wearing a pad in her pull-ups. She has not taken any medication to slow down the diarrhea as she was unsure of what to take. Patient also has nausea and abdominal pain.   She noticed blood in her stool on one occasion, which she attributes to straining and possible hemorrhoids. The blood was bright red, and she has not experienced any further episodes since then.  She has a history of low calcium  and hemoglobin levels, which were noted during a previous hospital visit. She has not seen a GI doctor recently and wants a colonoscopy to investigate her symptoms further.  She reports ongoing hip pain, which she believes may be related to her bowel issues. She received a steroid injection for her hip pain, but the pain persists. She is currently taking methocarbamol 500 mg three times a day and Tylenol  for pain management.  She has a history of high blood pressure, with recent readings of 180/88 and 160/80. She is currently taking hydralazine  25 mg twice a day, torsemide , and amlodipine  for blood pressure management. She denies chest pain, shortness of breath, or leg swelling.   She experiences difficulty sleeping, often walking the four times at night, and attributes this to being nervous and caring  for her husband, who has mobility issues.      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 83 yo   BACK SURGERY  2016   lower L 4 to L 5   bladder tack and uterus lift  10/10/2015   CHOLECYSTECTOMY     summer of 2022   CYSTOSCOPY WITH RETROGRADE PYELOGRAM, URETEROSCOPY AND STENT PLACEMENT Right 08/04/2022   Procedure: CYSTOSCOPY WITH RIGHT URETERAL STENT EXCHANGE;  Surgeon: Mallie Seal, MD;  Location: [facility] OR;  Service: Urology;  Laterality: Right;   IR RADIOLOGIST EVAL & MGMT  12/03/2018   IR RADIOLOGIST EVAL & MGMT  12/31/2018   IR REMOVAL TUN ACCESS W/ PORT W/O FL MOD SED  08/08/2022   PARATHYROIDECTOMY Left 07/02/2016   Procedure: LEFT INFERIOR PARATHYROIDECTOMY;  Surgeon: Oralee Billow, MD;  Location: [facility] ORS;  Service: General;  Laterality: Left;   SHOULDER OPEN ROTATOR CUFF REPAIR  12/2019   TEE WITHOUT CARDIOVERSION N/A 08/10/2022   Procedure: TRANSESOPHAGEAL  ECHOCARDIOGRAM (TEE);  Surgeon: Hazle Lites, MD;  Location: [facility] ENDOSCOPY;  Service: Cardiovascular;  Laterality: N/A;   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    Family History  Problem Relation Age of Onset   Emphysema Mother    Hyperlipidemia Sister    Hypertension Sister    COPD Sister    Hyperlipidemia Sister    Hypertension Sister    Lymphoma Sister    Obesity Brother    Hyperlipidemia Brother    Hypertension Brother    Breast cancer Neg Hx     Social History   Socioeconomic History   Marital status: Married    Spouse name:  Porfirio Bristol   Number of children: 2   Years of education: 11   Highest education level: 11th grade  Occupational History   Occupation: Retired  Tobacco Use   Smoking status: Former    Current packs/day: 0.00    Average packs/day: 1 pack/day for 50.0 years (50.0 ttl pk-yrs)    Types: Cigarettes    Start date: 06/12/1955    Quit date: 06/11/2005    Years since quitting: 18.4   Smokeless tobacco: Never  Vaping Use   Vaping status: Never Used  Substance and Sexual Activity   Alcohol use: No   Drug use: No   Sexual activity: Yes    Partners: Male  Other Topics Concern   Not on file  Social History Narrative   Not on file   Social Drivers of Health   Financial Resource Strain: Medium Risk (09/03/2023)   Overall Financial Resource Strain (CARDIA)    Difficulty of Paying Living Expenses: Somewhat hard  Food Insecurity: No Food Insecurity (10/07/2023)   Hunger Vital Sign    Worried About Running Out of Food in the Last Year: Never true    Ran Out of Food in the Last Year: Never true  Transportation Needs: No Transportation Needs (10/07/2023)   PRAPARE - Administrator, Civil Service (Medical): No    Lack of Transportation (Non-Medical): No  Physical Activity: Inactive (10/07/2023)   Exercise Vital Sign    Days of Exercise per Week: 0 days    Minutes of Exercise per Session: 0 min  Stress: Stress Concern Present (10/07/2023)   [employer] of Occupational Health - Occupational Stress Questionnaire    Feeling of Stress : To some extent  Social Connections: Moderately Integrated (02/26/2023)   Social Connection and Isolation Panel [NHANES]    Frequency of Communication with Friends and Family: More than three times a week    Frequency of Social Gatherings with Friends and Family: More than three times a week    Attends Religious Services: More than 4 times per year    Active Member of [employer] or Organizations: No    Attends Banker Meetings: Never    Marital  Status: Married  Catering manager Violence: Not At Risk (10/07/2023)   Humiliation, Afraid, Rape, and Kick questionnaire    Fear of Current or Ex-Partner: No    Emotionally Abused: No    Physically Abused: No    Sexually Abused: No    Outpatient Medications Prior to Visit  Medication Sig Dispense Refill   amLODipine  (NORVASC ) 10 MG tablet TAKE 1 TABLET(10 MG) BY MOUTH DAILY 90 tablet 1   atorvastatin  (LIPITOR) 40 MG tablet TAKE 1 TABLET BY MOUTH EVERY DAY 90 tablet 1   Cholecalciferol (VITAMIN D) 125 MCG (5000 UT) CAPS Take 1 capsule by mouth daily.  cyanocobalamin  (VITAMIN B12) 1000 MCG/ML injection Inject 1 mL (1,000 mcg total) into the muscle every 14 (fourteen) days. 4 mL 1   ferrous sulfate  325 (65 FE) MG tablet Take 1 tablet (325 mg total) by mouth daily. 90 tablet 0   levothyroxine  (SYNTHROID ) 100 MCG tablet Take 1 tablet (100 mcg total) by mouth daily. 90 tablet 0   nitroGLYCERIN  (NITROSTAT ) 0.4 MG SL tablet Place 1 tablet (0.4 mg total) under the tongue every 5 (five) minutes as needed for chest pain. 30 tablet 3   ondansetron  (ZOFRAN -ODT) 4 MG disintegrating tablet Take 1 tablet (4 mg total) by mouth every 8 (eight) hours as needed for nausea or vomiting. 20 tablet 1   rOPINIRole  (REQUIP ) 2 MG tablet TAKE 1 TABLET(2 MG) BY MOUTH TWICE DAILY 60 tablet 2   torsemide  (DEMADEX ) 20 MG tablet Take 1 tablet (20 mg total) by mouth daily as needed (edema). 30 tablet 2   VENTOLIN  HFA 108 (90 Base) MCG/ACT inhaler Inhale 1-2 puffs into the lungs every 6 (six) hours as needed for wheezing or shortness of breath. 18 g 2   hydrALAZINE  (APRESOLINE ) 25 MG tablet TAKE 1 TABLET(25 MG) BY MOUTH TWICE DAILY 60 tablet 2   No facility-administered medications prior to visit.    Allergies  Allergen Reactions   Contrast Media [Iodinated Contrast Media] Hives, Itching and Rash   Omeprazole  Itching   Shellfish Allergy Nausea And Vomiting   Shellfish-Derived Products Nausea And Vomiting    Review  of Systems  Constitutional:  Positive for appetite change. Negative for chills, fatigue and fever.  HENT:  Negative for congestion, ear pain and sore throat.   Respiratory:  Negative for cough, chest tightness, shortness of breath and wheezing.   Cardiovascular:  Negative for chest pain and palpitations.  Gastrointestinal:  Positive for abdominal distention, abdominal pain, diarrhea and nausea. Negative for blood in stool, constipation and vomiting.       Blood in stool  Endocrine: Negative for polydipsia, polyphagia and polyuria.  Genitourinary:  Negative for difficulty urinating and dysuria.  Musculoskeletal:  Negative for arthralgias, back pain and myalgias.  Skin:  Negative for rash.  Neurological:  Negative for headaches.  Psychiatric/Behavioral:  Positive for sleep disturbance. Negative for dysphoric mood. The patient is not nervous/anxious.        Objective:         11/18/2023   11:27 AM 11/18/2023   10:50 AM 11/18/2023   10:40 AM  Vitals with BMI  Height   5\' 2"   Weight   138 lbs  BMI   25.23  Systolic 160 188 409  Diastolic 80 80 88  Pulse   64    No data found.   Physical Exam Constitutional:      General: She is not in acute distress.    Appearance: Normal appearance. She is not ill-appearing.  Eyes:     Conjunctiva/sclera: Conjunctivae normal.  Cardiovascular:     Rate and Rhythm: Normal rate and regular rhythm.     Heart sounds: Normal heart sounds. No murmur heard. Pulmonary:     Effort: Pulmonary effort is normal.     Breath sounds: Examination of the right-upper field reveals wheezing. Wheezing present.  Abdominal:     Palpations: Abdomen is soft.     Tenderness: There is abdominal tenderness in the suprapubic area and left upper quadrant.  Musculoskeletal:        General: Normal range of motion.     Cervical back: Normal range  of motion.  Skin:    General: Skin is warm.  Neurological:     Mental Status: She is alert. Mental status is at baseline.   Psychiatric:        Mood and Affect: Mood normal.        Behavior: Behavior normal.     Health Maintenance Due  Topic Date Due   Zoster Vaccines- Shingrix (2 of 2) 10/12/2021    There are no preventive care reminders to display for this patient.   Lab Results  Component Value Date   TSH 4.350 07/16/2023   Lab Results  Component Value Date   WBC 9.8 11/10/2023   HGB 8.5 (A) 11/10/2023   HCT 27 (A) 11/10/2023   MCV 95 09/13/2023   PLT 476 (A) 11/10/2023   Lab Results  Component Value Date   NA 138 11/10/2023   K 5.1 11/10/2023   CO2 15 11/10/2023   GLUCOSE 120 (H) 09/16/2023   BUN 58 (A) 11/10/2023   CREATININE 3.4 (A) 11/10/2023   BILITOT 0.2 09/16/2023   ALKPHOS 123 11/10/2023   AST 20 11/10/2023   ALT 12 11/10/2023   PROT 5.9 (L) 09/16/2023   ALBUMIN  3.9 11/10/2023   CALCIUM  8.4 (A) 11/10/2023   ANIONGAP 9 08/13/2022   EGFR 13 11/10/2023   Lab Results  Component Value Date   CHOL 120 08/23/2023   Lab Results  Component Value Date   HDL 43 08/23/2023   Lab Results  Component Value Date   LDLCALC 61 08/23/2023   Lab Results  Component Value Date   TRIG 81 08/23/2023   Lab Results  Component Value Date   CHOLHDL 2.8 08/23/2023   Lab Results  Component Value Date   HGBA1C 5.3 05/31/2023       Assessment & Plan:  Acute diarrhea Assessment & Plan: Acute diarrhea with recent exacerbation and blood in stool, likely due to hemorrhoids or straining. Weight loss suggests possible underlying gastrointestinal issues. Prefers colonoscopy over repeat stool studies. - Refer to GI specialist for colonoscopy. - Advise use of barrier creams like A&D ointment or Doctor Smith's to prevent skin breakdown. - Encourage hydration with fluids like Pedialyte or Gatorade to replace electrolytes. -Seek emergency care if symptoms worsen   Orders: -     Ambulatory referral to Gastroenterology -     Comprehensive metabolic panel with GFR  Blood in  stool Assessment & Plan: Acute blood, frank blood noted Possible differentials include hemorrhoids, GI bleed, diverticulosis flare, ischemic or infectious colitis, or malignancy. - referral to GI - recent stool studies showed no infectious agent  Orders: -     Ambulatory referral to Gastroenterology  Anemia due to stage 5 chronic kidney disease, not on chronic dialysis [facility]) Assessment & Plan: Hemoglobin at 8.5 indicates anemia. Recent weight loss and gastrointestinal symptoms may contribute. - Order CBC to reassess hemoglobin   Orders: -     CBC with Differential/Platelet  Hypertension, uncontrolled -     EKG 12-Lead -     hydrALAZINE  HCl; Take 1 tablet (50 mg total) by mouth 3 (three) times daily.  Dispense: 90 tablet; Refill: 2 -     AMB Referral [facility] Care Management -     Ambulatory referral to [facility]  Left upper quadrant abdominal tenderness without rebound tenderness Assessment & Plan: Possible UTI, unable to void  - patient to bring in urine sample to evaluate and treat as needed.    Right hip pain Assessment & Plan:  Persistent hip pain not alleviated by current medication regimen. - referral to ortho for further evaluation and treatment  Orders: -     Ambulatory referral to Orthopedics     Meds ordered this encounter  Medications   hydrALAZINE  (APRESOLINE ) 50 MG tablet    Sig: Take 1 tablet (50 mg total) by mouth 3 (three) times daily.    Dispense:  90 tablet    Refill:  2    Orders Placed This Encounter  Procedures   Comprehensive metabolic panel with GFR   CBC with Differential   Ambulatory referral to Gastroenterology   AMB Referral [facility] Care Management   Ambulatory referral to [facility]   AMB referral to orthopedics   EKG 12-Lead        Follow-up: Return in about 2 weeks (around 12/02/2023) for BP recheck, med check.  An After Visit Summary was printed and given to the patient.  Delford Felling, FNP [facility] [phone]

## 2023-11-18 NOTE — Assessment & Plan Note (Signed)
 Hemoglobin at 8.5 indicates anemia. Recent weight loss and gastrointestinal symptoms may contribute. - Order CBC to reassess hemoglobin

## 2023-11-18 NOTE — Assessment & Plan Note (Signed)
 Acute diarrhea with recent exacerbation and blood in stool, likely due to hemorrhoids or straining. Weight loss suggests possible underlying gastrointestinal issues. Prefers colonoscopy over repeat stool studies. - Refer to GI specialist for colonoscopy. - Advise use of barrier creams like A&D ointment or Doctor Smith's to prevent skin breakdown. - Encourage hydration with fluids like Pedialyte or Gatorade to replace electrolytes. -Seek emergency care if symptoms worsen

## 2023-11-18 NOTE — Patient Instructions (Addendum)
 A&D ointment Dr. Shirlie Dove ointment hydrALAZINE  (APRESOLINE ) 50 MG tablet by mouth THREE TIMES A DAY.

## 2023-11-19 ENCOUNTER — Ambulatory Visit

## 2023-11-19 ENCOUNTER — Other Ambulatory Visit: Payer: Self-pay | Admitting: Family Medicine

## 2023-11-19 ENCOUNTER — Ambulatory Visit: Payer: Self-pay | Admitting: Family Medicine

## 2023-11-19 LAB — CBC WITH DIFFERENTIAL/PLATELET
Basophils Absolute: 0.1 10*3/uL (ref 0.0–0.2)
Basos: 0 %
EOS (ABSOLUTE): 0.1 10*3/uL (ref 0.0–0.4)
Eos: 1 %
Hematocrit: 29.1 % — ABNORMAL LOW (ref 34.0–46.6)
Hemoglobin: 8.9 g/dL — ABNORMAL LOW (ref 11.1–15.9)
Immature Grans (Abs): 0.1 10*3/uL (ref 0.0–0.1)
Immature Granulocytes: 1 %
Lymphocytes Absolute: 1.8 10*3/uL (ref 0.7–3.1)
Lymphs: 15 %
MCH: 30.6 pg (ref 26.6–33.0)
MCHC: 30.6 g/dL — ABNORMAL LOW (ref 31.5–35.7)
MCV: 100 fL — ABNORMAL HIGH (ref 79–97)
Monocytes Absolute: 0.7 10*3/uL (ref 0.1–0.9)
Monocytes: 5 %
Neutrophils Absolute: 9.8 10*3/uL — ABNORMAL HIGH (ref 1.4–7.0)
Neutrophils: 78 %
Platelets: 410 10*3/uL (ref 150–450)
RBC: 2.91 x10E6/uL — ABNORMAL LOW (ref 3.77–5.28)
RDW: 14.3 % (ref 11.7–15.4)
WBC: 12.5 10*3/uL — ABNORMAL HIGH (ref 3.4–10.8)

## 2023-11-19 LAB — POCT URINALYSIS DIP (CLINITEK)
Bilirubin, UA: NEGATIVE
Glucose, UA: NEGATIVE mg/dL
Ketones, POC UA: NEGATIVE mg/dL
Nitrite, UA: NEGATIVE
POC PROTEIN,UA: 100 — AB
Spec Grav, UA: 1.015 (ref 1.010–1.025)
Urobilinogen, UA: 0.2 U/dL
pH, UA: 6 (ref 5.0–8.0)

## 2023-11-19 LAB — COMPREHENSIVE METABOLIC PANEL WITH GFR
ALT: 8 IU/L (ref 0–32)
AST: 13 IU/L (ref 0–40)
Albumin: 4.1 g/dL (ref 3.8–4.8)
Alkaline Phosphatase: 141 IU/L — ABNORMAL HIGH (ref 44–121)
BUN/Creatinine Ratio: 14 (ref 12–28)
BUN: 51 mg/dL — ABNORMAL HIGH (ref 8–27)
Bilirubin Total: <0.2 mg/dL (ref 0.0–1.2)
CO2: 12 mmol/L — ABNORMAL LOW (ref 20–29)
Calcium: 8.9 mg/dL (ref 8.7–10.3)
Chloride: 108 mmol/L — ABNORMAL HIGH (ref 96–106)
Creatinine, Ser: 3.55 mg/dL — ABNORMAL HIGH (ref 0.57–1.00)
Globulin, Total: 2.8 g/dL (ref 1.5–4.5)
Glucose: 99 mg/dL (ref 70–99)
Potassium: 5.3 mmol/L — ABNORMAL HIGH (ref 3.5–5.2)
Sodium: 140 mmol/L (ref 134–144)
Total Protein: 6.9 g/dL (ref 6.0–8.5)
eGFR: 12 mL/min/{1.73_m2} — ABNORMAL LOW (ref >59)

## 2023-11-19 MED ORDER — CIPROFLOXACIN HCL 250 MG PO TABS: 250.0000 mg | ORAL_TABLET | Freq: Every day | ORAL | 0 refills | Status: AC

## 2023-11-19 NOTE — Addendum Note (Signed)
 Addended by: Jamie Mccoy I on: 11/19/2023 10:06 AM   Modules accepted: Orders

## 2023-11-19 NOTE — Progress Notes (Signed)
 Patient is here to recheck her blood pressure. Her blood pressure was 160/80 and pulse 90. She started hydralazine  50 mg 1 tablet 3 times a day yesterday ( last night). There is too early to evaluate if medication is working. She will come back in one week to see Dr Reinhold Carbine. She denies Chest pain, SOB. She states that she did not sleep last night and it is hard for her to relax at night and fall asleep.  UA showed an UTI, so Dr Reinhold Carbine sent antibiotic Cipro  renal dosage.  Dr Reinhold Carbine ordered Urine culture.  Patient verbalized to understand.

## 2023-11-20 NOTE — Assessment & Plan Note (Signed)
 BP Readings from Last 3 Encounters:  11/18/23 (!) 160/80  11/14/23 (!) 148/88  11/11/23 130/72   EKG today due to extreme elevation of BP. Patient has taken her BP medication today.  EKG - NSR Ventricular Rate 82, PR 132, QRS 74, QTc 469, no ST elevation or depression. - Continue current medication management - amlodipine  10 mg once daily and torsemide  20 mg once daily as needed for edema - Add Hydralazine  50 mg by mouth THREE TIMES A DAY  - Referral to [facility]BCI care management - Referral to advanced Hypertension clinic

## 2023-11-21 ENCOUNTER — Ambulatory Visit: Payer: Self-pay | Admitting: Family Medicine

## 2023-11-21 ENCOUNTER — Ambulatory Visit: Admitting: Family Medicine

## 2023-11-21 ENCOUNTER — Encounter: Payer: Self-pay | Admitting: Family Medicine

## 2023-11-21 VITALS — BP 140/90 | HR 94 | Temp 99.3°F | Resp 16 | Ht 62.0 in | Wt 138.0 lb

## 2023-11-21 DIAGNOSIS — M25551 Pain in right hip: Secondary | ICD-10-CM | POA: Diagnosis not present

## 2023-11-21 DIAGNOSIS — R197 Diarrhea, unspecified: Secondary | ICD-10-CM | POA: Diagnosis not present

## 2023-11-21 DIAGNOSIS — N390 Urinary tract infection, site not specified: Secondary | ICD-10-CM | POA: Insufficient documentation

## 2023-11-21 DIAGNOSIS — E039 Hypothyroidism, unspecified: Secondary | ICD-10-CM | POA: Diagnosis not present

## 2023-11-21 DIAGNOSIS — I10 Essential (primary) hypertension: Secondary | ICD-10-CM | POA: Diagnosis not present

## 2023-11-21 MED ORDER — LEVOTHYROXINE SODIUM 100 MCG PO TABS
100.0000 ug | ORAL_TABLET | Freq: Every day | ORAL | 0 refills | Status: AC
Start: 2023-11-21 — End: 2024-02-19

## 2023-11-21 NOTE — Telephone Encounter (Signed)
 FYI Only or Action Required?: FYI only for provider  Patient was last seen in primary care on 11/18/2023 by Janece Means, FNP. Called Nurse Triage reporting Hip Pain. Symptoms began several months ago. Symptoms are: unchanged.  Triage Disposition: See HCP Within 4 Hours (Or PCP Triage)  Patient/caregiver understands and will follow disposition?: Yes                   Copied from CRM [id number]. Topic: Clinical - Red Word Triage >> Nov 21, 2023  1:14 PM [place] wrote: Red Word that prompted transfer to Nurse Triage: right hip severe pain- level 10   ----------------------------------------------------------------------- From previous Reason for Contact - Cancel/Reschedule: Patient/patient representative is calling to cancel or reschedule an appointment. Refer to attachments for appointment information. Reason for Disposition  [1] SEVERE pain (e.g., excruciating, unable to do any normal activities) AND [2] not improved after 2 hours of pain medicine  Answer Assessment - Initial Assessment Questions Chronic right hip pain 10/10 pain constant Can walk Denies swelling or redness Denies pain radiation Not sure of cause Had diarrhea but has now stopped after taking ropinirole , last time diarrhea was yesterday  Protocols used: Hip Pain-A-AH

## 2023-11-21 NOTE — Assessment & Plan Note (Signed)
 Persistent hip pain not alleviated by current medication regimen. - referral to ortho for further evaluation and treatment - appointment scheduled for tomorrow @ 1015 at [facility]

## 2023-11-21 NOTE — Assessment & Plan Note (Addendum)
 Improved with Pedialyte and antibiotics ordered for UTI  - Possibly related to a diverticulosis flare - Awaiting GI to call for referral that was ordered

## 2023-11-21 NOTE — Assessment & Plan Note (Addendum)
 Improved,  Likely higher recently due to hip pain BP Readings from Last 3 Encounters:  11/21/23 (!) 140/90  11/18/23 (!) 160/80  11/14/23 (!) 148/88   Taking amlodipine  10 mg daily, Torsemide  20 mg daily  and Hydralazine  50 mg by mouth THREE TIMES A DAY

## 2023-11-21 NOTE — Progress Notes (Signed)
 Acute Office Visit  Subjective:    Patient ID: Alexandra [name], female    DOB: January 21, 1941, 83 y.o.   MRN: [id number]  Chief Complaint  Patient presents with   Hip Pain   Hip Pain: Alexandra [name] is a 83 year old female with right hip pain that started over two weeks ago. There was no injury mechanism. The quality of the pain is described as burning and shooting. The pain is at a severity of 10/10. The pain has been constant since onset. Pertinent negatives include no loss of sensation, numbness or tingling. The symptoms are aggravated by movement and weight bearing. She has tried heat, acetaminophen , methocarbamol 500 mg THREE TIMES A DAY, rest, lidocaine  patches, and a steroid injection for the symptoms. The treatment has not provided any relief. Alexandra [name] has significant kidney disease and is unable to take NSAIDs for pain and inflammation. Previous hip xray has showed significant arthritis. She has not heard from Ortho regarding further evaluation and treatment.   Hypertension: Patient's BP is somewhat better today than last week, 140/90. Her pain doesn't help her BP. Her medications are hydralazine  50 mg THREE TIMES A DAY, amlodipine  10 mg, torsemide  20 mg as needed for edema.   UTI: Incidental finding at last visit. Patient currently taking Cipro  250 mg x 5 days. Urine culture has not resulted. Denies any fever or flank pain.  Diarrhea: Improved after antibiotics and Pedialyte. Patient denies any loose stools since her last visit.      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 83 yo   BACK SURGERY  2016   lower L 4 to L 5   bladder tack and uterus lift  10/10/2015   CHOLECYSTECTOMY     summer of 2022   CYSTOSCOPY WITH RETROGRADE PYELOGRAM, URETEROSCOPY AND STENT PLACEMENT Right 08/04/2022   Procedure: CYSTOSCOPY WITH RIGHT URETERAL STENT EXCHANGE;  Surgeon: Mallie Seal, MD;  Location[facility];  Service: Urology;  Laterality: Right;   IR RADIOLOGIST EVAL & MGMT  12/03/2018   IR RADIOLOGIST EVAL & MGMT  12/31/2018   IR REMOVAL TUN ACCESS W/ PORT W/O FL MOD SED  08/08/2022   PARATHYROIDECTOMY Left 07/02/2016   Procedure: LEFT INFERIOR PARATHYROIDECTOMY;  Surgeon: Oralee Billow, MD;  Location: [facility] ORS;  Service: General;  Laterality: Left;   SHOULDER OPEN ROTATOR CUFF REPAIR  12/2019   TEE WITHOUT CARDIOVERSION N/A 08/10/2022   Procedure: TRANSESOPHAGEAL ECHOCARDIOGRAM (TEE);  Surgeon: Hazle Lites, MD;  Location: [facility] ENDOSCOPY;  Service: Cardiovascular;  Laterality: N/A;   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    Family History  Problem Relation Age of Onset   Emphysema Mother    Hyperlipidemia Sister    Hypertension Sister    COPD Sister    Hyperlipidemia Sister    Hypertension Sister    Lymphoma Sister    Obesity Brother    Hyperlipidemia Brother    Hypertension Brother    Breast cancer Neg Hx  Social History   Socioeconomic History   Marital status: Married    Spouse name: Porfirio Bristol   Number of children: 2   Years of education: 11   Highest education level: 11th grade  Occupational History   Occupation: Retired  Tobacco Use   Smoking status: Former    Current packs/day: 0.00    Average packs/day: 1 pack/day for 50.0 years (50.0 ttl pk-yrs)    Types: Cigarettes    Start date: 06/12/1955    Quit date: 06/11/2005    Years since quitting: 18.4   Smokeless tobacco: Never  Vaping Use   Vaping status: Never Used  Substance and Sexual Activity   Alcohol use: No   Drug  use: No   Sexual activity: Yes    Partners: Male  Other Topics Concern   Not on file  Social History Narrative   Not on file   Social Drivers of Health   Financial Resource Strain: Medium Risk (09/03/2023)   Overall Financial Resource Strain (CARDIA)    Difficulty of Paying Living Expenses: Somewhat hard  Food Insecurity: No Food Insecurity (10/07/2023)   Hunger Vital Sign    Worried About Running Out of Food in the Last Year: Never true    Ran Out of Food in the Last Year: Never true  Transportation Needs: No Transportation Needs (10/07/2023)   PRAPARE - Administrator, Civil Service (Medical): No    Lack of Transportation (Non-Medical): No  Physical Activity: Inactive (10/07/2023)   Exercise Vital Sign    Days of Exercise per Week: 0 days    Minutes of Exercise per Session: 0 min  Stress: Stress Concern Present (10/07/2023)   [employer] of Occupational Health - Occupational Stress Questionnaire    Feeling of Stress : To some extent  Social Connections: Moderately Integrated (02/26/2023)   Social Connection and Isolation Panel    Frequency of Communication with Friends and Family: More than three times a week    Frequency of Social Gatherings with Friends and Family: More than three times a week    Attends Religious Services: More than 4 times per year    Active Member of [employer] or Organizations: No    Attends Banker Meetings: Never    Marital Status: Married  Catering manager Violence: Not At Risk (10/07/2023)   Humiliation, Afraid, Rape, and Kick questionnaire    Fear of Current or Ex-Partner: No    Emotionally Abused: No    Physically Abused: No    Sexually Abused: No    Outpatient Medications Prior to Visit  Medication Sig Dispense Refill   amLODipine  (NORVASC ) 10 MG tablet TAKE 1 TABLET(10 MG) BY MOUTH DAILY 90 tablet 1   atorvastatin  (LIPITOR) 40 MG tablet TAKE 1 TABLET BY MOUTH EVERY DAY 90 tablet 1   Cholecalciferol (VITAMIN D) 125 MCG  (5000 UT) CAPS Take 1 capsule by mouth daily.     ciprofloxacin  (CIPRO ) 250 MG tablet Take 1 tablet (250 mg total) by mouth daily with breakfast for 5 days. 5 tablet 0   cyanocobalamin  (VITAMIN B12) 1000 MCG/ML injection Inject 1 mL (1,000 mcg total) into the muscle every 14 (fourteen) days. 4 mL 1   ferrous sulfate  325 (65 FE) MG tablet Take 1 tablet (325 mg total) by mouth daily. 90 tablet 0   hydrALAZINE  (APRESOLINE ) 50 MG tablet Take 1 tablet (50 mg total) by mouth 3 (three) times daily. 90 tablet 2   nitroGLYCERIN  (NITROSTAT ) 0.4 MG SL tablet Place  1 tablet (0.4 mg total) under the tongue every 5 (five) minutes as needed for chest pain. 30 tablet 3   ondansetron  (ZOFRAN -ODT) 4 MG disintegrating tablet Take 1 tablet (4 mg total) by mouth every 8 (eight) hours as needed for nausea or vomiting. 20 tablet 1   rOPINIRole  (REQUIP ) 2 MG tablet TAKE 1 TABLET(2 MG) BY MOUTH TWICE DAILY 60 tablet 2   torsemide  (DEMADEX ) 20 MG tablet Take 1 tablet (20 mg total) by mouth daily as needed (edema). 30 tablet 2   VENTOLIN  HFA 108 (90 Base) MCG/ACT inhaler Inhale 1-2 puffs into the lungs every 6 (six) hours as needed for wheezing or shortness of breath. 18 g 2   levothyroxine  (SYNTHROID ) 100 MCG tablet Take 1 tablet (100 mcg total) by mouth daily. 90 tablet 0   No facility-administered medications prior to visit.    Allergies  Allergen Reactions   Contrast Media [Iodinated Contrast Media] Hives, Itching and Rash   Omeprazole  Itching   Shellfish Allergy Nausea And Vomiting   Shellfish-Derived Products Nausea And Vomiting    Review of Systems  Constitutional:  Negative for chills, diaphoresis, fatigue and fever.  HENT:  Negative for congestion, ear pain and sinus pain.   Eyes: Negative.   Respiratory:  Negative for cough and shortness of breath.   Cardiovascular:  Negative for chest pain.  Gastrointestinal:  Negative for abdominal pain, constipation, diarrhea, nausea and vomiting.  Endocrine:  Negative.   Genitourinary:  Negative for dysuria.  Musculoskeletal:  Positive for arthralgias (rt hip pain).  Skin: Negative.   Allergic/Immunologic: Negative.   Neurological:  Negative for weakness, numbness and headaches.  Hematological: Negative.   Psychiatric/Behavioral:  Negative for dysphoric mood. The patient is not nervous/anxious.        Objective:        11/21/2023    2:10 PM 11/18/2023   11:27 AM 11/18/2023   10:50 AM  Vitals with BMI  Height 5' 2    Weight 138 lbs    BMI 25.23    Systolic 140 160 528  Diastolic 90 80 80  Pulse 94      No data found.   Physical Exam Vitals reviewed.  Constitutional:      General: She is not in acute distress.    Appearance: Normal appearance. She is not ill-appearing.   Eyes:     Conjunctiva/sclera: Conjunctivae normal.    Cardiovascular:     Rate and Rhythm: Normal rate and regular rhythm.     Heart sounds: Normal heart sounds. No murmur heard. Pulmonary:     Effort: Pulmonary effort is normal.     Breath sounds: Normal breath sounds. No wheezing.  Abdominal:     General: Bowel sounds are normal.   Musculoskeletal:     Right hip: Tenderness present. Decreased range of motion.   Skin:    General: Skin is warm.   Neurological:     Mental Status: She is alert. Mental status is at baseline.   Psychiatric:        Mood and Affect: Mood normal.        Behavior: Behavior normal.     Health Maintenance Due  Topic Date Due   Zoster Vaccines- Shingrix (2 of 2) 10/12/2021    There are no preventive care reminders to display for this patient.   Lab Results  Component Value Date   TSH 4.350 07/16/2023   Lab Results  Component Value Date   WBC 12.5 (H) 11/18/2023  HGB 8.9 (L) 11/18/2023   HCT 29.1 (L) 11/18/2023   MCV 100 (H) 11/18/2023   PLT 410 11/18/2023   Lab Results  Component Value Date   NA 140 11/18/2023   K 5.3 (H) 11/18/2023   CO2 12 (L) 11/18/2023   GLUCOSE 99 11/18/2023   BUN 51 (H)  11/18/2023   CREATININE 3.55 (H) 11/18/2023   BILITOT <0.2 11/18/2023   ALKPHOS 141 (H) 11/18/2023   AST 13 11/18/2023   ALT 8 11/18/2023   PROT 6.9 11/18/2023   ALBUMIN  4.1 11/18/2023   CALCIUM  8.9 11/18/2023   ANIONGAP 9 08/13/2022   EGFR 12 (L) 11/18/2023   Lab Results  Component Value Date   CHOL 120 08/23/2023   Lab Results  Component Value Date   HDL 43 08/23/2023   Lab Results  Component Value Date   LDLCALC 61 08/23/2023   Lab Results  Component Value Date   TRIG 81 08/23/2023   Lab Results  Component Value Date   CHOLHDL 2.8 08/23/2023   Lab Results  Component Value Date   HGBA1C 5.3 05/31/2023       Assessment & Plan:  Right hip pain Assessment & Plan: Persistent hip pain not alleviated by current medication regimen. - referral to ortho for further evaluation and treatment - appointment scheduled for tomorrow @ 1015 at [facility]   Essential hypertension Assessment & Plan: Improved,  Likely higher recently due to hip pain BP Readings from Last 3 Encounters:  11/21/23 (!) 140/90  11/18/23 (!) 160/80  11/14/23 (!) 148/88   Taking amlodipine  10 mg daily, Torsemide  20 mg daily  and Hydralazine  50 mg by mouth THREE TIMES A DAY       Acquired hypothyroidism Assessment & Plan: Previously well controlled - Continue Synthroid  100 mcg by mouth, refill sent    Orders: -     Levothyroxine  Sodium; Take 1 tablet (100 mcg total) by mouth daily.  Dispense: 90 tablet; Refill: 0  Acute diarrhea Assessment & Plan: Improved with Pedialyte and antibiotics ordered for UTI  - Possibly related to a diverticulosis flare - Awaiting GI to call for referral that was ordered      Meds ordered this encounter  Medications   levothyroxine  (SYNTHROID ) 100 MCG tablet    Sig: Take 1 tablet (100 mcg total) by mouth daily.    Dispense:  90 tablet    Refill:  0    No orders of the defined types were placed in this encounter.  Follow-up: Return if symptoms  worsen or fail to improve, for keep scheduled appointment.  An After Visit Summary was printed and given to the patient.  Delford Felling, FNP [facility] [phone]

## 2023-11-21 NOTE — Assessment & Plan Note (Signed)
 Previously well controlled - Continue Synthroid  100 mcg by mouth, refill sent

## 2023-11-22 MED ORDER — AMOXICILLIN 500 MG PO CAPS: 500.0000 mg | ORAL_CAPSULE | Freq: Two times a day (BID) | ORAL | 0 refills | Status: DC

## 2023-11-25 ENCOUNTER — Other Ambulatory Visit: Payer: Self-pay | Admitting: Licensed Clinical Social Worker

## 2023-11-25 ENCOUNTER — Other Ambulatory Visit: Payer: Self-pay

## 2023-11-25 NOTE — Patient Outreach (Signed)
 Complex Care Management   Visit Note  11/25/2023  Name:  Alexandra [name] MRN: [id number] DOB: Feb 02, 1941  Situation: Referral received for Complex Care Management related to anxiety issues, client management of medical needs I obtained verbal consent from Alexandra [name], spouse of client.  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   Hypertension    Osteoporosis    Restless legs syndrome    RLS (restless legs syndrome)    SIRS (systemic inflammatory response syndrome) (HCC) 08/03/2022    Assessment: Patient Reported Symptoms:  Cognitive    Some memory challenges    Neurological    Anxiety occasionally;   HEENT    Decreased hearing     Cardiovascular  HTN    Respiratory  No problems noted    Endocrine Patient reports the following symptoms related to hypoglycemia or hyperglycemia : Weakness or fatigue, Increased urination Endocrine Management Strategies: Adequate rest  Gastrointestinal Gastrointestinal Symptoms Reported: Vomiting Additional Gastrointestinal Details: occasional vomiting Gastrointestinal Conditions: Nausea Gastrointestinal Management Strategies: Adequate rest    Genitourinary  Frequency ; stress incontinence    Integumentary Integumentary Symptoms Reported: No symptoms reported    Musculoskeletal Musculoskelatal Symptoms Reviewed: Difficulty walking (uses a walker as needed) Musculoskeletal Conditions: Unsteady gait Musculoskeletal Management Strategies: Adequate rest    Right hip pain  Psychosocial Psychosocial Symptoms Reported: Anxiety - if selected complete GAD, Sadness - if selected complete PHQ 2-9            11/25/2023    9:12 AM  Depression screen PHQ 2/9   Decreased Interest 1  Down, Depressed, Hopeless 1  PHQ - 2 Score 2  Altered sleeping 1  Tired, decreased energy 1  Change in appetite 1  Feeling bad or failure about yourself  1  Trouble concentrating 1  Moving slowly or fidgety/restless 1  Suicidal thoughts 0  PHQ-9 Score 8  Difficult doing work/chores Somewhat difficult    Vitals:   11/21/23 0902  BP: (!) 140/90    Medications Reviewed Today     Reviewed by Suann Elms, LCSW (Social Worker) on 11/25/23 at [phone]  Med List Status: <None>   Medication Order Taking? Sig Documenting Provider Last Dose Status Informant  amLODipine  (NORVASC ) 10 MG tablet [id number] Yes TAKE 1 TABLET(10 MG) BY MOUTH DAILY Cox, Kirsten, MD  Active   amoxicillin  (AMOXIL ) 500 MG capsule [id number] Yes Take 1 capsule (500 mg total) by mouth 2 (two) times daily for 7 days. Cox, Kirsten, MD  Active   atorvastatin  (LIPITOR) 40 MG tablet [id number] Yes TAKE 1 TABLET BY MOUTH EVERY DAY Cox, Kirsten, MD  Active   Cholecalciferol (VITAMIN D) 125 MCG (5000 UT) CAPS [id number] Yes Take 1 capsule by mouth daily. [provider]  Active Self  cyanocobalamin  (VITAMIN B12) 1000 MCG/ML injection [id number] Yes Inject 1 mL (1,000 mcg total) into the muscle every 14 (fourteen) days. Mercy Stall, MD  Active   ferrous sulfate  325 (65 FE) MG tablet [id number] Yes Take 1 tablet (325 mg total) by mouth daily. Janece Means, FNP  Active   hydrALAZINE  (APRESOLINE ) 50 MG tablet [id number] Yes Take 1 tablet (50 mg total) by mouth 3 (three) times daily. Janece Means, FNP  Active   levothyroxine  (SYNTHROID ) 100 MCG tablet [id number] Yes Take 1 tablet (100 mcg total) by mouth daily. Janece Means, FNP  Active   nitroGLYCERIN  (NITROSTAT ) 0.4 MG SL tablet [id number] Yes Place 1 tablet (0.4 mg total) under the tongue every 5 (five) minutes as needed for chest pain. Cox, Kirsten, MD  Active            Med Note [facility] [place] A   Thu Aug 08, 2023  1:06 PM) Patient reports she  has a new bottle but has not needed  ondansetron  (ZOFRAN -ODT) 4 MG disintegrating tablet [id number]  Take 1 tablet (4 mg total) by mouth every 8 (eight) hours as needed for nausea or vomiting. Sirivol, Mamatha, MD  Active   rOPINIRole  (REQUIP ) 2 MG tablet [id number] Yes TAKE 1 TABLET(2 MG) BY MOUTH TWICE DAILY Cox, Kirsten, MD  Active   torsemide  (DEMADEX ) 20 MG tablet [id number]  Take 1 tablet (20 mg total) by mouth daily as needed (edema). CoxBurleigh Carp, MD  Active            Med Note [facility], [place] A   Thu Aug 08, 2023  1:09 PM) Patient states she hasn't needed because she has no swelling. States she knows to use it if needed  VENTOLIN  HFA 108 (90 Base) MCG/ACT inhaler [id number] Yes Inhale 1-2 puffs into the lungs every 6 (six) hours as needed for wheezing or shortness of breath. CoxBurleigh Carp, MD  Active Self           Med Note April Knack, CATHERINE T   Tue Jan 08, 2023  3:05 PM)              Recommendation:   PCP Follow-up Client to take medications as prescribed Client to attend scheduled medical appointments Client to call LCSW as needed for SW support Client to allow time for ADLs completion.  Client to allow time for self care  Follow Up Plan:   Telephone follow up appointment date/time:  12/30/23 at 2:00 PM     Alexandria Angel  MSW, LCSW [place]/Value Based Care [facility] Licensed Clinical Social Worker Direct Dial:  [phone] Fax:  [phone] Website:  [email or web address]

## 2023-11-25 NOTE — Patient Instructions (Signed)
 Visit Information  Thank you for taking time to visit with me today. Please don't hesitate to contact me if I can be of assistance to you before our next scheduled appointment.  Our next appointment is by telephone on December 30, 2023 at 2:00 PM   Please call the care guide team at [phone] if you need to cancel or reschedule your appointment.   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              Over next 30 days patient will be able to complete daily ADLs and complete daily activities of choice with help from family members AEB patient report to LCSW  Interventions:  LCSW spoke with Ronney Cola, spouse of patient, today via phone about client needs and status            Discussed appetite of client, sleeping challenges of client, and pain issues of client            Discussed that client is currently at [facility] being evaluated for right hip pain issues. Porfirio Bristol said that client has had x-rays completed            Discussed family support for client with Porfirio Bristol (spouse) and client daughter            Discussed transport needs of client           Discussed medication procurement of client           Discussed anxiety of client and mood of client. Porfirio Bristol feels that client mood is basically stable. She does get anxious periodically (per Porfirio Bristol)           Discussed program support for client           Encouraged client or Porfirio Bristol to call LCSW as needed for SW support for  client [phone])           Porfirio Bristol was appreciative of phone call from LCSW today to discuss needs of client  Patient Goals/Self-Care Activities:  Take medications as prescribed             Attend scheduled medical appointments             Allow time for ADLs completion             Discuss daily needs with her family members in order to receive help as needed from family members             Attend PCP appointments as scheduled   Plan:   LCSW to call client on 12/30/23 at 2:00 PM         Please go to  [facility] [address] [place] [phone]) if you are experiencing a Mental Health or Behavioral Health Crisis or need someone to talk to.  The patient / Semira Stoltzfus, spouse of patient, verbalized understanding of instructions, educational materials, and care plan provided today and DECLINED offer to receive copy of patient instructions, educational materials, and care plan.    Alexandria Angel  MSW, LCSW [place]/Value Based Care [facility] Licensed Clinical Social Worker Direct Dial:  [phone] Fax:  [phone] Website:  [email or web address]

## 2023-11-27 NOTE — Progress Notes (Signed)
 Subjective:  Patient ID: Alexandra [name], female    DOB: [birth year]  Age: 83 y.o. MRN: [id number]  Chief Complaint  Patient presents with   Medical Management of Chronic Issues    HPI: 83 yo female went to [facility] on 6/16 for right hip pain and was found to have a pelvic fracture. She was given a steroid shot and discharged. While leaving she became tripped up in her shoes and fell forward hitting the right side of her chest. She was taken back for an hour to work on high bp and then discharged only to return on Tuesday due to difficulty breathing and found to have 3 rib fractures (2,4,5.) Using incentive spirometry to 1000. Goal is 1500. Patient is using ice pack. Taking tylenol  500 mg  2 twice a day. Taking on oxycodone  daily.   Patient also had a urinary tract infection and I changed her antibiotic on Monday from cipro  to amoxicillin . No burning with urination.    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 Patient also has high grade, TCC with muscle invasion. She did undergo chemo and radiation for the bladder cancer. She sees Dr. Marda.   Patients medications were reviewed. We will repeat labs today.      11/25/2023    9:12 AM 11/14/2023    7:30 AM 02/26/2023    9:17 AM 01/25/2023    9:34 AM 01/07/2023    1:38 PM  Depression screen PHQ 2/9  Decreased Interest 1 0 0 0 0  Down, Depressed, Hopeless 1 0 0 0 0  PHQ - 2 Score 2 0 0 0 0  Altered sleeping 1  0 0   Tired, decreased energy 1  0 0   Change in appetite 1  0 0   Feeling bad or failure about yourself  1  0 0   Trouble concentrating 1  0 0   Moving slowly or fidgety/restless 1  0 0   Suicidal thoughts 0  0 0   PHQ-9 Score 8  0 0   Difficult doing work/chores Somewhat difficult  Not difficult at all Not difficult at all          11/28/2023    7:42 AM  Fall Risk   Falls in the past year? 1  Comment Fell at [facility] tripped over her shoe. Broke 3 ribs, fractured pelvis. Head CT normal.  Number falls in past yr: 0  Injury with Fall? 1  Risk for fall due to : Impaired balance/gait  Follow up Falls evaluation completed    Patient Care Team: Sherre Clapper, MD as PCP - General (Internal Medicine) Bernie Guillermina BROCKS, MD (Inactive) as Consulting Physician (Internal Medicine) Gearline Norris, MD as Consulting Physician (Nephrology) Marda General, MD as Consulting Physician (Urology) Monetta Redell PARAS, MD as Consulting Physician (Cardiology) Beverli Cara PARAS, [facility] (Inactive) (Pharmacist) Frances Ozell GORMAN HUGHS as [facility] Care Management (Licensed Clinical Social Worker)   Review of Systems  Constitutional:  Negative for chills, fatigue and fever.       Sore from falling.  HENT:  Negative for congestion, ear pain, rhinorrhea and sore throat.   Respiratory:  Negative for cough and shortness of breath.   Cardiovascular:  Negative for chest pain.  Gastrointestinal:  Negative for abdominal pain, constipation, diarrhea, nausea and vomiting.  Genitourinary:  Negative  for dysuria and urgency.  Musculoskeletal:  Negative for back pain and myalgias.  Neurological:  Negative for dizziness, weakness, light-headedness and headaches.  Psychiatric/Behavioral:  Negative for dysphoric mood. The patient is not nervous/anxious.     Current Outpatient Medications on File Prior to Visit  Medication Sig Dispense Refill   oxyCODONE  (OXY IR/ROXICODONE ) 5 MG immediate release tablet Take 5 mg by mouth every 6 (six) hours as needed.     amLODipine  (NORVASC ) 10 MG tablet TAKE 1 TABLET(10 MG) BY MOUTH DAILY 90 tablet 1   amoxicillin  (AMOXIL ) 500 MG capsule Take 1 capsule (500 mg total) by mouth 2 (two) times daily for 7 days. 14 capsule 0   atorvastatin  (LIPITOR) 40 MG tablet TAKE 1 TABLET BY MOUTH EVERY DAY 90 tablet 1   Cholecalciferol (VITAMIN D)  125 MCG (5000 UT) CAPS Take 1 capsule by mouth daily.     cyanocobalamin  (VITAMIN B12) 1000 MCG/ML injection Inject 1 mL (1,000 mcg total) into the muscle every 14 (fourteen) days. 4 mL 1   ferrous sulfate  325 (65 FE) MG tablet Take 1 tablet (325 mg total) by mouth daily. 90 tablet 0   hydrALAZINE  (APRESOLINE ) 50 MG tablet Take 1 tablet (50 mg total) by mouth 3 (three) times daily. 90 tablet 2   levothyroxine  (SYNTHROID ) 100 MCG tablet Take 1 tablet (100 mcg total) by mouth daily. 90 tablet 0   nitroGLYCERIN  (NITROSTAT ) 0.4 MG SL tablet Place 1 tablet (0.4 mg total) under the tongue every 5 (five) minutes as needed for chest pain. 30 tablet 3   ondansetron  (ZOFRAN -ODT) 4 MG disintegrating tablet Take 1 tablet (4 mg total) by mouth every 8 (eight) hours as needed for nausea or vomiting. 20 tablet 1   rOPINIRole  (REQUIP ) 2 MG tablet TAKE 1 TABLET(2 MG) BY MOUTH TWICE DAILY 60 tablet 2   torsemide  (DEMADEX ) 20 MG tablet Take 1 tablet (20 mg total) by mouth daily as needed (edema). 30 tablet 2   VENTOLIN  HFA 108 (90 Base) MCG/ACT inhaler Inhale 1-2 puffs into the lungs every 6 (six) hours as needed for wheezing or shortness of breath. 18 g 2   No current facility-administered medications on file prior to visit.   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 83 yo   BACK SURGERY  2016   lower L 4 to L 5   bladder tack and uterus lift  10/10/2015   CHOLECYSTECTOMY     summer of 2022   CYSTOSCOPY WITH RETROGRADE PYELOGRAM,  URETEROSCOPY AND STENT PLACEMENT Right 08/04/2022   Procedure: CYSTOSCOPY WITH RIGHT URETERAL STENT EXCHANGE;  Surgeon: Lovie Arlyss CROME, MD;  Location: [facility] OR;  Service: Urology;  Laterality: Right;   IR RADIOLOGIST EVAL & MGMT  12/03/2018   IR RADIOLOGIST EVAL & MGMT  12/31/2018   IR REMOVAL TUN ACCESS W/ PORT W/O FL MOD SED  08/08/2022   PARATHYROIDECTOMY Left 07/02/2016   Procedure: LEFT INFERIOR PARATHYROIDECTOMY;  Surgeon: Krystal Spinner, MD;  Location: [facility] ORS;  Service: General;  Laterality: Left;   SHOULDER OPEN ROTATOR CUFF REPAIR  12/2019   TEE WITHOUT CARDIOVERSION N/A 08/10/2022   Procedure: TRANSESOPHAGEAL ECHOCARDIOGRAM (TEE);  Surgeon: Mona Vinie BROCKS, MD;  Location: [facility] ENDOSCOPY;  Service: Cardiovascular;  Laterality: N/A;   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    Family History  Problem Relation Age of Onset   Emphysema Mother    Hyperlipidemia Sister    Hypertension Sister    COPD Sister    Hyperlipidemia Sister    Hypertension Sister    Lymphoma Sister    Obesity Brother    Hyperlipidemia Brother    Hypertension Brother    Breast cancer Neg Hx    Social History   Socioeconomic History   Marital status: Married    Spouse name: Lamar   Number of children: 2   Years of education: 11   Highest education level: 11th grade  Occupational History   Occupation: Retired  Tobacco Use   Smoking status: Former    Current packs/day: 0.00    Average packs/day: 1 pack/day for 50.0 years (50.0 ttl pk-yrs)    Types: Cigarettes    Start date: 06/12/1955    Quit date: 06/11/2005    Years since quitting: 18.4   Smokeless tobacco: Never  Vaping Use   Vaping status: Never Used  Substance and Sexual Activity   Alcohol use: No   Drug use: No   Sexual activity: Yes    Partners: Male  Other Topics Concern   Not on file  Social History Narrative   Not on file   Social Drivers of Health   Financial Resource Strain: Medium Risk (09/03/2023)   Overall Financial Resource  Strain (CARDIA)    Difficulty of Paying Living Expenses: Somewhat hard  Food Insecurity: No Food Insecurity (11/25/2023)   Hunger Vital Sign    Worried About Running Out of Food in the Last Year: Never true    Ran Out of Food in the Last Year: Never true  Transportation Needs: No Transportation Needs (11/25/2023)   PRAPARE - Administrator, Civil Service (Medical): No    Lack of Transportation (Non-Medical): No  Physical Activity: Inactive (11/25/2023)   Exercise Vital Sign    Days of Exercise per Week: 0 days    Minutes of Exercise per Session: 0 min  Stress: Stress Concern Present (11/25/2023)   [employer] of Occupational Health - Occupational Stress Questionnaire    Feeling of Stress: To some extent  Social Connections: Moderately Integrated (02/26/2023)   Social Connection and Isolation Panel    Frequency of Communication with Friends and Family: More than three times a week    Frequency of Social Gatherings with Friends and Family: More than three times a week    Attends Religious Services: More than 4 times per year    Active Member of [employer] or Organizations: No    Attends Banker Meetings: Never    Marital Status: Married    Objective:  BP 128/78   Pulse 98   Temp 97.8 F (36.6 C)   Ht 5' 2 (1.575 m)   Wt 141 lb (64 kg)   SpO2 97%   BMI 25.79 kg/m      11/29/2023   12:37 PM 11/29/2023   12:31 PM 11/29/2023   12:30 PM  BP/Weight  Systolic BP   170  Diastolic BP   71  Wt. (Lbs) 139.99 140   BMI 25.6 kg/m2 25.61 kg/m2  Physical Exam Vitals reviewed.  Constitutional:      Appearance: Normal appearance. She is normal weight.  Neck:     Vascular: No carotid bruit.   Cardiovascular:     Rate and Rhythm: Normal rate and regular rhythm.     Heart sounds: Normal heart sounds.     Comments: Right chest wall tender Pulmonary:     Effort: Pulmonary effort is normal. No respiratory distress.     Breath sounds: Normal breath sounds.   Abdominal:     General: Abdomen is flat. Bowel sounds are normal.     Palpations: Abdomen is soft.     Tenderness: There is no abdominal tenderness.   Skin:    Findings: Bruising (right breast) present.   Neurological:     Mental Status: She is alert and oriented to person, place, and time.   Psychiatric:        Mood and Affect: Mood normal.        Behavior: Behavior normal.         Lab Results  Component Value Date   WBC 20.8 (HH) 11/28/2023   HGB 8.6 (L) 11/28/2023   HCT 27.3 (L) 11/28/2023   PLT 503 (H) 11/28/2023   GLUCOSE 104 (H) 11/28/2023   CHOL 164 11/28/2023   TRIG 82 11/28/2023   HDL 63 11/28/2023   LDLCALC 86 11/28/2023   ALT 13 11/28/2023   AST 13 11/28/2023   NA 137 11/28/2023   K CANCELED 11/28/2023   CL 105 11/28/2023   CREATININE 7.09 (HH) 11/28/2023   BUN 96 (HH) 11/28/2023   CO2 10 (L) 11/28/2023   TSH 4.350 07/16/2023   INR 1.1 08/04/2022   HGBA1C 5.3 05/31/2023      Assessment & Plan:  Acute renal failure superimposed on stage 5 chronic kidney disease, not on chronic dialysis, unspecified acute renal failure type [facility]) Assessment & Plan: Sees nephrology.  LABS CAME BACK WITH GFR TO 5 AND CREATININE >7.  SENT BY EMS TO [place] ED ON 11/29/2023 AT 11 AM.    Other elevated white blood cell (WBC) count Assessment & Plan: CAME BACK AT 20 ON FRIDAY AM AND PATIENT SENT BY EMS TO [place] ED ON 11/29/2023 AT 11 AM.    Closed fracture of multiple ribs of right side, initial encounter Assessment & Plan: See ED visit under MEDIA tab.  Encouraged patient to take tylenol  500 mg 2 oral four times a day and oxycodone  if severe. She has been trying not to take the oxycodone  and limiting herself to once daily.  Encouraged incentive spirometry: currently at 1000 and would like her up to 1500.    Acute cystitis without hematuria -     POCT URINALYSIS DIP (CLINITEK) -     Urine Culture  Other closed fracture of right ischium, initial  encounter [facility]) Assessment & Plan: No surgery recommended.  See ED visit under MEDIA tab.    Essential hypertension Assessment & Plan: Well controlled. Continue taking amlodipine  10 mg daily, Torsemide  20 mg daily  and Hydralazine  50 mg by mouth THREE TIMES A DAY      Orders: -     CBC with Differential/Platelet -     Comprehensive metabolic panel with GFR  Chronic diastolic congestive heart failure (HCC) Assessment & Plan: No evidence of chf on exam. Continue torsemide  20 mg daily.    Chronic respiratory failure with hypoxia (HCC) Assessment & Plan: Continue oxygen  2 L daily.    Impaired fasting glucose  Assessment & Plan: A1c has been less than 5.7 repeatedly. Will not check today.    Acquired hypothyroidism Assessment & Plan: Previously well controlled - Continue Synthroid  100 mcg by mouth    Mixed hyperlipidemia Assessment & Plan: Well controlled.  No changes to medicines. Continue lipitor 40 mg before bed.  Continue to work on eating a healthy diet and exercise as tolerated Labs drawn today.    Orders: -     Lipid panel     No orders of the defined types were placed in this encounter.   Orders Placed This Encounter  Procedures   Urine Culture   CBC with Differential/Platelet   Comprehensive metabolic panel with GFR   Lipid panel   POCT URINALYSIS DIP (CLINITEK)     Follow-up: Return in about 1 week (around 12/05/2023) for  chronic with me thursday afternoon.SABRA LILLETTE Kato I Leal-Borjas,acting as a scribe for Abigail Free, MD.,have documented all relevant documentation on the behalf of Abigail Free, MD,as directed by  Abigail Free, MD while in the presence of Abigail Free, MD.   An After Visit Summary was printed and given to the patient.  I attest that I have reviewed this visit and agree with the plan scribed by my staff. Total time spent on today's visit was 50 minutes, including both face-to-face time and nonface-to-face time personally spent on  review of chart (labs and imaging), discussing labs and goals, discussing further work-up, treatment options, referrals to specialist if needed, reviewing outside records of pertinent, answering patient's questions, and coordinating care.   Abigail Free, MD [facility] [phone]

## 2023-11-28 ENCOUNTER — Encounter: Payer: Self-pay | Admitting: Family Medicine

## 2023-11-28 ENCOUNTER — Ambulatory Visit (INDEPENDENT_AMBULATORY_CARE_PROVIDER_SITE_OTHER): Admitting: Family Medicine

## 2023-11-28 VITALS — BP 128/78 | HR 98 | Temp 97.8°F | Ht 62.0 in | Wt 141.0 lb

## 2023-11-28 DIAGNOSIS — D72828 Other elevated white blood cell count: Secondary | ICD-10-CM

## 2023-11-28 DIAGNOSIS — E039 Hypothyroidism, unspecified: Secondary | ICD-10-CM

## 2023-11-28 DIAGNOSIS — N3000 Acute cystitis without hematuria: Secondary | ICD-10-CM | POA: Diagnosis not present

## 2023-11-28 DIAGNOSIS — N185 Chronic kidney disease, stage 5: Secondary | ICD-10-CM

## 2023-11-28 DIAGNOSIS — I5032 Chronic diastolic (congestive) heart failure: Secondary | ICD-10-CM

## 2023-11-28 DIAGNOSIS — S32691A Other specified fracture of right ischium, initial encounter for closed fracture: Secondary | ICD-10-CM

## 2023-11-28 DIAGNOSIS — E782 Mixed hyperlipidemia: Secondary | ICD-10-CM

## 2023-11-28 DIAGNOSIS — N179 Acute kidney failure, unspecified: Secondary | ICD-10-CM | POA: Diagnosis not present

## 2023-11-28 DIAGNOSIS — S2241XA Multiple fractures of ribs, right side, initial encounter for closed fracture: Secondary | ICD-10-CM

## 2023-11-28 DIAGNOSIS — R7301 Impaired fasting glucose: Secondary | ICD-10-CM

## 2023-11-28 DIAGNOSIS — I10 Essential (primary) hypertension: Secondary | ICD-10-CM

## 2023-11-28 DIAGNOSIS — J9611 Chronic respiratory failure with hypoxia: Secondary | ICD-10-CM

## 2023-11-28 LAB — POCT URINALYSIS DIP (CLINITEK)
Bilirubin, UA: NEGATIVE
Glucose, UA: NEGATIVE mg/dL
Ketones, POC UA: NEGATIVE mg/dL
Nitrite, UA: NEGATIVE
POC PROTEIN,UA: 30 — AB
Spec Grav, UA: 1.01 (ref 1.010–1.025)
Urobilinogen, UA: 0.2 U/dL
pH, UA: 6 (ref 5.0–8.0)

## 2023-11-28 NOTE — Patient Instructions (Signed)
 Increase tylenol  to 500 mg 2 up to 4 times a day.  May take oxycodone  one twice a day.

## 2023-11-28 NOTE — Assessment & Plan Note (Signed)
 Well controlled. Continue taking amlodipine  10 mg daily, Torsemide  20 mg daily  and Hydralazine  50 mg by mouth THREE TIMES A DAY

## 2023-11-28 NOTE — Assessment & Plan Note (Signed)
Continue oxygen 2 L daily.

## 2023-11-28 NOTE — Assessment & Plan Note (Signed)
 Not at goal. Continue amlodipine  10 mg daily.   Continue hydralazine  25 mg three times daily.

## 2023-11-28 NOTE — Assessment & Plan Note (Signed)
 Previously well controlled - Continue Synthroid  100 mcg by mouth

## 2023-11-28 NOTE — Assessment & Plan Note (Signed)
 Well controlled.  No changes to medicines. Continue lipitor 40 mg before bed.  Continue to work on eating a healthy diet and exercise as tolerated Labs drawn today.

## 2023-11-29 ENCOUNTER — Emergency Department (HOSPITAL_COMMUNITY)

## 2023-11-29 ENCOUNTER — Inpatient Hospital Stay (HOSPITAL_COMMUNITY)
Admission: EM | Admit: 2023-11-29 | Discharge: 2023-12-05 | DRG: 264 | Disposition: A | Discharge status: Home / Self Care | Attending: Internal Medicine | Admitting: Internal Medicine

## 2023-11-29 ENCOUNTER — Other Ambulatory Visit: Payer: Self-pay

## 2023-11-29 ENCOUNTER — Inpatient Hospital Stay (HOSPITAL_COMMUNITY)

## 2023-11-29 ENCOUNTER — Encounter (HOSPITAL_COMMUNITY): Payer: Self-pay

## 2023-11-29 ENCOUNTER — Ambulatory Visit: Payer: Self-pay | Admitting: Family Medicine

## 2023-11-29 DIAGNOSIS — E039 Hypothyroidism, unspecified: Secondary | ICD-10-CM | POA: Diagnosis present

## 2023-11-29 DIAGNOSIS — M810 Age-related osteoporosis without current pathological fracture: Secondary | ICD-10-CM | POA: Diagnosis present

## 2023-11-29 DIAGNOSIS — E875 Hyperkalemia: Secondary | ICD-10-CM | POA: Diagnosis present

## 2023-11-29 DIAGNOSIS — E872 Acidosis, unspecified: Secondary | ICD-10-CM | POA: Diagnosis present

## 2023-11-29 DIAGNOSIS — G2581 Restless legs syndrome: Secondary | ICD-10-CM | POA: Diagnosis present

## 2023-11-29 DIAGNOSIS — I509 Heart failure, unspecified: Secondary | ICD-10-CM | POA: Diagnosis not present

## 2023-11-29 DIAGNOSIS — S2241XA Multiple fractures of ribs, right side, initial encounter for closed fracture: Secondary | ICD-10-CM | POA: Insufficient documentation

## 2023-11-29 DIAGNOSIS — R32 Unspecified urinary incontinence: Secondary | ICD-10-CM | POA: Diagnosis present

## 2023-11-29 DIAGNOSIS — K219 Gastro-esophageal reflux disease without esophagitis: Secondary | ICD-10-CM | POA: Diagnosis present

## 2023-11-29 DIAGNOSIS — Z91013 Allergy to seafood: Secondary | ICD-10-CM | POA: Diagnosis not present

## 2023-11-29 DIAGNOSIS — Z992 Dependence on renal dialysis: Secondary | ICD-10-CM | POA: Diagnosis not present

## 2023-11-29 DIAGNOSIS — I5042 Chronic combined systolic (congestive) and diastolic (congestive) heart failure: Secondary | ICD-10-CM | POA: Diagnosis present

## 2023-11-29 DIAGNOSIS — W19XXXD Unspecified fall, subsequent encounter: Secondary | ICD-10-CM | POA: Diagnosis present

## 2023-11-29 DIAGNOSIS — E785 Hyperlipidemia, unspecified: Secondary | ICD-10-CM | POA: Diagnosis present

## 2023-11-29 DIAGNOSIS — N189 Chronic kidney disease, unspecified: Secondary | ICD-10-CM | POA: Diagnosis present

## 2023-11-29 DIAGNOSIS — N136 Pyonephrosis: Secondary | ICD-10-CM | POA: Diagnosis present

## 2023-11-29 DIAGNOSIS — Z90710 Acquired absence of both cervix and uterus: Secondary | ICD-10-CM

## 2023-11-29 DIAGNOSIS — Z91041 Radiographic dye allergy status: Secondary | ICD-10-CM

## 2023-11-29 DIAGNOSIS — Z85528 Personal history of other malignant neoplasm of kidney: Secondary | ICD-10-CM

## 2023-11-29 DIAGNOSIS — Z807 Family history of other malignant neoplasms of lymphoid, hematopoietic and related tissues: Secondary | ICD-10-CM

## 2023-11-29 DIAGNOSIS — I5032 Chronic diastolic (congestive) heart failure: Secondary | ICD-10-CM | POA: Diagnosis not present

## 2023-11-29 DIAGNOSIS — Z87440 Personal history of urinary (tract) infections: Secondary | ICD-10-CM

## 2023-11-29 DIAGNOSIS — J449 Chronic obstructive pulmonary disease, unspecified: Secondary | ICD-10-CM | POA: Diagnosis present

## 2023-11-29 DIAGNOSIS — N184 Chronic kidney disease, stage 4 (severe): Secondary | ICD-10-CM | POA: Diagnosis not present

## 2023-11-29 DIAGNOSIS — Z79890 Hormone replacement therapy (postmenopausal): Secondary | ICD-10-CM | POA: Diagnosis not present

## 2023-11-29 DIAGNOSIS — Z83438 Family history of other disorder of lipoprotein metabolism and other lipidemia: Secondary | ICD-10-CM

## 2023-11-29 DIAGNOSIS — N185 Chronic kidney disease, stage 5: Secondary | ICD-10-CM | POA: Diagnosis not present

## 2023-11-29 DIAGNOSIS — N178 Other acute kidney failure: Secondary | ICD-10-CM | POA: Diagnosis not present

## 2023-11-29 DIAGNOSIS — Z825 Family history of asthma and other chronic lower respiratory diseases: Secondary | ICD-10-CM

## 2023-11-29 DIAGNOSIS — Z66 Do not resuscitate: Secondary | ICD-10-CM | POA: Diagnosis present

## 2023-11-29 DIAGNOSIS — B952 Enterococcus as the cause of diseases classified elsewhere: Secondary | ICD-10-CM | POA: Diagnosis present

## 2023-11-29 DIAGNOSIS — R296 Repeated falls: Secondary | ICD-10-CM | POA: Diagnosis not present

## 2023-11-29 DIAGNOSIS — N19 Unspecified kidney failure: Principal | ICD-10-CM

## 2023-11-29 DIAGNOSIS — Z87891 Personal history of nicotine dependence: Secondary | ICD-10-CM

## 2023-11-29 DIAGNOSIS — N186 End stage renal disease: Secondary | ICD-10-CM | POA: Diagnosis present

## 2023-11-29 DIAGNOSIS — Z905 Acquired absence of kidney: Secondary | ICD-10-CM | POA: Diagnosis not present

## 2023-11-29 DIAGNOSIS — Z555 Less than a high school diploma: Secondary | ICD-10-CM

## 2023-11-29 DIAGNOSIS — I132 Hypertensive heart and chronic kidney disease with heart failure and with stage 5 chronic kidney disease, or end stage renal disease: Secondary | ICD-10-CM | POA: Diagnosis present

## 2023-11-29 DIAGNOSIS — Z9049 Acquired absence of other specified parts of digestive tract: Secondary | ICD-10-CM

## 2023-11-29 DIAGNOSIS — Z888 Allergy status to other drugs, medicaments and biological substances status: Secondary | ICD-10-CM

## 2023-11-29 DIAGNOSIS — D631 Anemia in chronic kidney disease: Secondary | ICD-10-CM | POA: Diagnosis present

## 2023-11-29 DIAGNOSIS — M109 Gout, unspecified: Secondary | ICD-10-CM | POA: Diagnosis present

## 2023-11-29 DIAGNOSIS — Z8249 Family history of ischemic heart disease and other diseases of the circulatory system: Secondary | ICD-10-CM | POA: Diagnosis not present

## 2023-11-29 DIAGNOSIS — J410 Simple chronic bronchitis: Secondary | ICD-10-CM | POA: Diagnosis not present

## 2023-11-29 DIAGNOSIS — S2241XD Multiple fractures of ribs, right side, subsequent encounter for fracture with routine healing: Secondary | ICD-10-CM

## 2023-11-29 DIAGNOSIS — C679 Malignant neoplasm of bladder, unspecified: Secondary | ICD-10-CM | POA: Diagnosis present

## 2023-11-29 DIAGNOSIS — N179 Acute kidney failure, unspecified: Secondary | ICD-10-CM | POA: Diagnosis present

## 2023-11-29 DIAGNOSIS — E782 Mixed hyperlipidemia: Secondary | ICD-10-CM | POA: Diagnosis not present

## 2023-11-29 DIAGNOSIS — R7989 Other specified abnormal findings of blood chemistry: Secondary | ICD-10-CM | POA: Diagnosis present

## 2023-11-29 DIAGNOSIS — I10 Essential (primary) hypertension: Secondary | ICD-10-CM | POA: Diagnosis present

## 2023-11-29 DIAGNOSIS — S32601A Unspecified fracture of right ischium, initial encounter for closed fracture: Secondary | ICD-10-CM | POA: Insufficient documentation

## 2023-11-29 DIAGNOSIS — Z604 Social exclusion and rejection: Secondary | ICD-10-CM | POA: Diagnosis present

## 2023-11-29 DIAGNOSIS — Z79899 Other long term (current) drug therapy: Secondary | ICD-10-CM

## 2023-11-29 DIAGNOSIS — Z5986 Financial insecurity: Secondary | ICD-10-CM

## 2023-11-29 LAB — LIPID PANEL
Chol/HDL Ratio: 2.6 ratio (ref 0.0–4.4)
Cholesterol, Total: 164 mg/dL (ref 100–199)
HDL: 63 mg/dL (ref >39)
LDL Chol Calc (NIH): 86 mg/dL (ref 0–99)
Triglycerides: 82 mg/dL (ref 0–149)
VLDL Cholesterol Cal: 15 mg/dL (ref 5–40)

## 2023-11-29 LAB — CBC WITH DIFFERENTIAL/PLATELET
Abs Immature Granulocytes: 0.24 10*3/uL — ABNORMAL HIGH (ref 0.00–0.07)
Basophils Absolute: 0 10*3/uL (ref 0.0–0.2)
Basophils Absolute: 0 10*3/uL (ref 0.0–0.1)
Basophils Relative: 0 %
Basos: 0 %
EOS (ABSOLUTE): 0.1 10*3/uL (ref 0.0–0.4)
Eos: 0 %
Eosinophils Absolute: 0 10*3/uL (ref 0.0–0.5)
Eosinophils Relative: 0 %
HCT: 26.8 % — ABNORMAL LOW (ref 36.0–46.0)
Hematocrit: 27.3 % — ABNORMAL LOW (ref 34.0–46.6)
Hemoglobin: 8.6 g/dL — ABNORMAL LOW (ref 11.1–15.9)
Hemoglobin: 8.1 g/dL — ABNORMAL LOW (ref 12.0–15.0)
Immature Grans (Abs): 0.4 10*3/uL — ABNORMAL HIGH (ref 0.0–0.1)
Immature Granulocytes: 2 %
Immature Granulocytes: 2 %
Lymphocytes Absolute: 1.5 10*3/uL (ref 0.7–3.1)
Lymphocytes Relative: 6 %
Lymphs Abs: 1 10*3/uL (ref 0.7–4.0)
Lymphs: 7 %
MCH: 30.7 pg (ref 26.6–33.0)
MCH: 30.8 pg (ref 26.0–34.0)
MCHC: 31.5 g/dL (ref 31.5–35.7)
MCHC: 30.2 g/dL (ref 30.0–36.0)
MCV: 98 fL — ABNORMAL HIGH (ref 79–97)
MCV: 101.9 fL — ABNORMAL HIGH (ref 80.0–100.0)
Monocytes Absolute: 1.1 10*3/uL — ABNORMAL HIGH (ref 0.1–0.9)
Monocytes Absolute: 0.8 10*3/uL (ref 0.1–1.0)
Monocytes Relative: 5 %
Monocytes: 5 %
Neutro Abs: 13.8 10*3/uL — ABNORMAL HIGH (ref 1.7–7.7)
Neutrophils Absolute: 17.7 10*3/uL — ABNORMAL HIGH (ref 1.4–7.0)
Neutrophils Relative %: 87 %
Neutrophils: 86 %
Platelets: 503 10*3/uL — ABNORMAL HIGH (ref 150–450)
Platelets: 481 10*3/uL — ABNORMAL HIGH (ref 150–400)
RBC: 2.8 x10E6/uL — ABNORMAL LOW (ref 3.77–5.28)
RBC: 2.63 MIL/uL — ABNORMAL LOW (ref 3.87–5.11)
RDW: 13.6 % (ref 11.7–15.4)
RDW: 16.4 % — ABNORMAL HIGH (ref 11.5–15.5)
WBC: 20.8 10*3/uL (ref 3.4–10.8)
WBC: 15.9 10*3/uL — ABNORMAL HIGH (ref 4.0–10.5)
nRBC: 0 % (ref 0.0–0.2)

## 2023-11-29 LAB — COMPREHENSIVE METABOLIC PANEL WITH GFR
ALT: 13 IU/L (ref 0–32)
ALT: 16 U/L (ref 0–44)
AST: 13 IU/L (ref 0–40)
AST: 14 U/L — ABNORMAL LOW (ref 15–41)
Albumin: 3.8 g/dL (ref 3.8–4.8)
Albumin: 2.8 g/dL — ABNORMAL LOW (ref 3.5–5.0)
Alkaline Phosphatase: 116 IU/L (ref 44–121)
Alkaline Phosphatase: 71 U/L (ref 38–126)
Anion gap: 13 (ref 5–15)
BUN/Creatinine Ratio: 14 (ref 12–28)
BUN: 96 mg/dL (ref 8–27)
BUN: 104 mg/dL — ABNORMAL HIGH (ref 8–23)
Bilirubin Total: 0.2 mg/dL (ref 0.0–1.2)
CO2: 10 mmol/L — ABNORMAL LOW (ref 20–29)
CO2: 16 mmol/L — ABNORMAL LOW (ref 22–32)
Calcium: 8.6 mg/dL — ABNORMAL LOW (ref 8.7–10.3)
Calcium: 8.3 mg/dL — ABNORMAL LOW (ref 8.9–10.3)
Chloride: 105 mmol/L (ref 96–106)
Chloride: 107 mmol/L (ref 98–111)
Creatinine, Ser: 7.09 mg/dL (ref 0.57–1.00)
Creatinine, Ser: 6.95 mg/dL — ABNORMAL HIGH (ref 0.44–1.00)
GFR, Estimated: 6 mL/min — ABNORMAL LOW (ref >60)
Globulin, Total: 2.8 g/dL (ref 1.5–4.5)
Glucose, Bld: 95 mg/dL (ref 70–99)
Glucose: 104 mg/dL — ABNORMAL HIGH (ref 70–99)
Potassium: 7 mmol/L (ref 3.5–5.1)
Sodium: 137 mmol/L (ref 134–144)
Sodium: 136 mmol/L (ref 135–145)
Total Bilirubin: 0.6 mg/dL (ref 0.0–1.2)
Total Protein: 6.6 g/dL (ref 6.0–8.5)
Total Protein: 6.5 g/dL (ref 6.5–8.1)
eGFR: 5 mL/min/{1.73_m2} — ABNORMAL LOW (ref >59)

## 2023-11-29 LAB — TROPONIN I (HIGH SENSITIVITY)
Troponin I (High Sensitivity): 24 ng/L — ABNORMAL HIGH (ref <18)
Troponin I (High Sensitivity): 22 ng/L — ABNORMAL HIGH (ref <18)

## 2023-11-29 LAB — MAGNESIUM: Magnesium: 1.4 mg/dL — ABNORMAL LOW (ref 1.7–2.4)

## 2023-11-29 LAB — RENAL FUNCTION PANEL
Albumin: 2.6 g/dL — ABNORMAL LOW (ref 3.5–5.0)
Anion gap: 16 — ABNORMAL HIGH (ref 5–15)
BUN: 97 mg/dL — ABNORMAL HIGH (ref 8–23)
CO2: 16 mmol/L — ABNORMAL LOW (ref 22–32)
Calcium: 8.2 mg/dL — ABNORMAL LOW (ref 8.9–10.3)
Chloride: 107 mmol/L (ref 98–111)
Creatinine, Ser: 6.28 mg/dL — ABNORMAL HIGH (ref 0.44–1.00)
GFR, Estimated: 6 mL/min — ABNORMAL LOW (ref >60)
Glucose, Bld: 141 mg/dL — ABNORMAL HIGH (ref 70–99)
Phosphorus: 5.8 mg/dL — ABNORMAL HIGH (ref 2.5–4.6)
Potassium: 6 mmol/L — ABNORMAL HIGH (ref 3.5–5.1)
Sodium: 139 mmol/L (ref 135–145)

## 2023-11-29 LAB — BRAIN NATRIURETIC PEPTIDE: B Natriuretic Peptide: 1321.3 pg/mL — ABNORMAL HIGH (ref 0.0–100.0)

## 2023-11-29 LAB — MRSA NEXT GEN BY PCR, NASAL: MRSA by PCR Next Gen: DETECTED — AB

## 2023-11-29 LAB — HEPATITIS B SURFACE ANTIGEN: Hepatitis B Surface Ag: NONREACTIVE

## 2023-11-29 MED ORDER — CALCIUM GLUCONATE-NACL 1-0.675 GM/50ML-% IV SOLN
1.0000 g | Freq: Once | INTRAVENOUS | Status: AC
  Administered 2023-11-29: 1000 mg via INTRAVENOUS
  Filled 2023-11-29: qty 50

## 2023-11-29 MED ORDER — HYDROCODONE-ACETAMINOPHEN 5-325 MG PO TABS
1.0000 | ORAL_TABLET | Freq: Once | ORAL | Status: AC
  Administered 2023-11-29: 1 via ORAL
  Filled 2023-11-29: qty 1

## 2023-11-29 MED ORDER — SODIUM CHLORIDE 0.9 % IV SOLN
2.0000 g | Freq: Once | INTRAVENOUS | Status: AC
  Administered 2023-11-29: 2 g via INTRAVENOUS
  Filled 2023-11-29: qty 12.5

## 2023-11-29 MED ORDER — HYDROCODONE-ACETAMINOPHEN 7.5-325 MG PO TABS
1.0000 | ORAL_TABLET | Freq: Four times a day (QID) | ORAL | Status: DC | PRN
  Administered 2023-11-30 – 2023-12-03 (×8): 1 via ORAL
  Filled 2023-11-29 (×8): qty 1

## 2023-11-29 MED ORDER — LIDOCAINE 5 % EX PTCH
1.0000 | MEDICATED_PATCH | CUTANEOUS | Status: DC
  Administered 2023-11-29 – 2023-12-04 (×6): 1 via TRANSDERMAL
  Filled 2023-11-29 (×6): qty 1

## 2023-11-29 MED ORDER — ACETAMINOPHEN 325 MG PO TABS
650.0000 mg | ORAL_TABLET | Freq: Four times a day (QID) | ORAL | Status: DC | PRN
  Administered 2023-12-01: 650 mg via ORAL
  Filled 2023-11-29: qty 2

## 2023-11-29 MED ORDER — ATORVASTATIN CALCIUM 40 MG PO TABS
40.0000 mg | ORAL_TABLET | Freq: Every day | ORAL | Status: DC
  Administered 2023-11-29 – 2023-12-04 (×6): 40 mg via ORAL
  Filled 2023-11-29 (×6): qty 1

## 2023-11-29 MED ORDER — SODIUM BICARBONATE 8.4 % IV SOLN
50.0000 meq | Freq: Once | INTRAVENOUS | Status: AC
  Administered 2023-11-29: 50 meq via INTRAVENOUS
  Filled 2023-11-29: qty 50

## 2023-11-29 MED ORDER — ONDANSETRON HCL 4 MG/2ML IJ SOLN
4.0000 mg | Freq: Four times a day (QID) | INTRAMUSCULAR | Status: DC | PRN
  Administered 2023-12-01 – 2023-12-02 (×2): 4 mg via INTRAVENOUS
  Filled 2023-11-29 (×3): qty 2

## 2023-11-29 MED ORDER — FERROUS SULFATE 325 (65 FE) MG PO TABS
325.0000 mg | ORAL_TABLET | Freq: Every morning | ORAL | Status: DC
  Administered 2023-11-30 – 2023-12-05 (×5): 325 mg via ORAL
  Filled 2023-11-29 (×6): qty 1

## 2023-11-29 MED ORDER — SODIUM CHLORIDE 0.9 % IV SOLN
1.0000 g | INTRAVENOUS | Status: DC
  Administered 2023-11-30: 1 g via INTRAVENOUS
  Filled 2023-11-29 (×2): qty 10

## 2023-11-29 MED ORDER — ROPINIROLE HCL 1 MG PO TABS
2.0000 mg | ORAL_TABLET | Freq: Two times a day (BID) | ORAL | Status: DC
  Administered 2023-11-29 – 2023-12-05 (×12): 2 mg via ORAL
  Filled 2023-11-29 (×13): qty 2

## 2023-11-29 MED ORDER — MELATONIN 3 MG PO TABS
3.0000 mg | ORAL_TABLET | Freq: Every evening | ORAL | Status: DC | PRN
  Administered 2023-12-01 – 2023-12-04 (×4): 3 mg via ORAL
  Filled 2023-11-29 (×4): qty 1

## 2023-11-29 MED ORDER — SODIUM BICARBONATE 8.4 % IV SOLN
INTRAVENOUS | Status: AC
  Filled 2023-11-29: qty 50

## 2023-11-29 MED ORDER — DARBEPOETIN ALFA 150 MCG/0.3ML IJ SOSY
150.0000 ug | PREFILLED_SYRINGE | INTRAMUSCULAR | Status: DC
  Administered 2023-11-30: 150 ug via SUBCUTANEOUS
  Filled 2023-11-29: qty 0.3

## 2023-11-29 MED ORDER — VANCOMYCIN VARIABLE DOSE PER UNSTABLE RENAL FUNCTION (PHARMACIST DOSING): Status: DC

## 2023-11-29 MED ORDER — SODIUM CHLORIDE 0.9 % IV BOLUS
1000.0000 mL | Freq: Once | INTRAVENOUS | Status: AC
  Administered 2023-11-29: 1000 mL via INTRAVENOUS

## 2023-11-29 MED ORDER — SODIUM ZIRCONIUM CYCLOSILICATE 10 G PO PACK
10.0000 g | PACK | Freq: Once | ORAL | Status: AC
  Administered 2023-11-29: 10 g via ORAL
  Filled 2023-11-29: qty 1

## 2023-11-29 MED ORDER — PANTOPRAZOLE SODIUM 40 MG PO TBEC
40.0000 mg | DELAYED_RELEASE_TABLET | Freq: Every day | ORAL | Status: DC
  Administered 2023-11-29 – 2023-12-05 (×7): 40 mg via ORAL
  Filled 2023-11-29 (×7): qty 1

## 2023-11-29 MED ORDER — HYDRALAZINE HCL 50 MG PO TABS
50.0000 mg | ORAL_TABLET | Freq: Three times a day (TID) | ORAL | Status: DC
  Administered 2023-11-29 – 2023-12-04 (×6): 50 mg via ORAL
  Filled 2023-11-29 (×11): qty 1
  Filled 2023-11-29: qty 2

## 2023-11-29 MED ORDER — FUROSEMIDE 10 MG/ML IJ SOLN: 40.0000 mg | Freq: Once | INTRAMUSCULAR | Status: DC

## 2023-11-29 MED ORDER — AMLODIPINE BESYLATE 10 MG PO TABS
10.0000 mg | ORAL_TABLET | Freq: Every morning | ORAL | Status: DC
  Administered 2023-11-30 – 2023-12-05 (×4): 10 mg via ORAL
  Filled 2023-11-29: qty 1
  Filled 2023-11-29: qty 2
  Filled 2023-11-29 (×3): qty 1

## 2023-11-29 MED ORDER — DEXTROSE 50 % IV SOLN
1.0000 | Freq: Once | INTRAVENOUS | Status: AC
  Administered 2023-11-29: 50 mL via INTRAVENOUS
  Filled 2023-11-29: qty 50

## 2023-11-29 MED ORDER — HEPARIN SODIUM (PORCINE) 5000 UNIT/ML IJ SOLN
5000.0000 [IU] | Freq: Three times a day (TID) | INTRAMUSCULAR | Status: DC
  Administered 2023-11-29 – 2023-12-05 (×13): 5000 [IU] via SUBCUTANEOUS
  Filled 2023-11-29 (×13): qty 1

## 2023-11-29 MED ORDER — LEVOTHYROXINE SODIUM 100 MCG PO TABS
100.0000 ug | ORAL_TABLET | Freq: Every day | ORAL | Status: DC
  Administered 2023-11-30 – 2023-12-05 (×5): 100 ug via ORAL
  Filled 2023-11-29 (×5): qty 1

## 2023-11-29 MED ORDER — ONDANSETRON HCL 4 MG PO TABS: 4.0000 mg | ORAL_TABLET | Freq: Four times a day (QID) | ORAL | Status: DC | PRN

## 2023-11-29 MED ORDER — CHLORHEXIDINE GLUCONATE CLOTH 2 % EX PADS
6.0000 | MEDICATED_PAD | Freq: Every day | CUTANEOUS | Status: DC
  Administered 2023-11-30 – 2023-12-05 (×6): 6 via TOPICAL

## 2023-11-29 MED ORDER — SODIUM BICARBONATE 8.4 % IV SOLN
100.0000 meq | Freq: Once | INTRAVENOUS | Status: AC
  Administered 2023-11-29: 100 meq via INTRAVENOUS
  Filled 2023-11-29: qty 50

## 2023-11-29 MED ORDER — IPRATROPIUM-ALBUTEROL 0.5-2.5 (3) MG/3ML IN SOLN: 3.0000 mL | RESPIRATORY_TRACT | Status: DC | PRN

## 2023-11-29 MED ORDER — INSULIN ASPART 100 UNIT/ML IV SOLN
5.0000 [IU] | Freq: Once | INTRAVENOUS | Status: AC
  Administered 2023-11-29: 5 [IU] via INTRAVENOUS

## 2023-11-29 MED ORDER — POLYETHYLENE GLYCOL 3350 17 G PO PACK: 17.0000 g | PACK | Freq: Every day | ORAL | Status: DC | PRN

## 2023-11-29 MED ORDER — CALCIUM GLUCONATE-NACL 1-0.675 GM/50ML-% IV SOLN
1.0000 g | Freq: Once | INTRAVENOUS | Status: AC
  Administered 2023-11-30: 1000 mg via INTRAVENOUS
  Filled 2023-11-29: qty 50

## 2023-11-29 MED ORDER — ACETAMINOPHEN 650 MG RE SUPP: 650.0000 mg | Freq: Four times a day (QID) | RECTAL | Status: DC | PRN

## 2023-11-29 MED ORDER — IPRATROPIUM-ALBUTEROL 0.5-2.5 (3) MG/3ML IN SOLN
3.0000 mL | Freq: Once | RESPIRATORY_TRACT | Status: AC
  Administered 2023-11-29: 3 mL via RESPIRATORY_TRACT
  Filled 2023-11-29: qty 3

## 2023-11-29 MED ORDER — FUROSEMIDE 10 MG/ML IJ SOLN
20.0000 mg | Freq: Once | INTRAMUSCULAR | Status: AC
  Administered 2023-11-29: 20 mg via INTRAVENOUS
  Filled 2023-11-29: qty 2

## 2023-11-29 MED ORDER — VANCOMYCIN HCL 1250 MG/250ML IV SOLN
1250.0000 mg | Freq: Once | INTRAVENOUS | Status: AC
  Administered 2023-11-29: 1250 mg via INTRAVENOUS
  Filled 2023-11-29: qty 250

## 2023-11-29 NOTE — Assessment & Plan Note (Signed)
 See ED visit under MEDIA tab.  Encouraged patient to take tylenol  500 mg 2 oral four times a day and oxycodone  if severe. She has been trying not to take the oxycodone  and limiting herself to once daily.  Encouraged incentive spirometry: currently at 1000 and would like her up to 1500.

## 2023-11-29 NOTE — Consult Note (Signed)
 [facility] Renal Consultation Note  Requesting MD: Hester Lot Indication for Consultation: advanced CKD-  hyperkalemia   HPI:  Alexandra [name] is [name] 83 y.o. 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  Patient had of late been seen by Dr. Christianne Cowper at [facility] and also Dr. Reginal Capra having ureteral stent changed quarterly.  She was in her usual state of health-  living with her chronically ill husband but family helping out-  independent with ADLs.  However, over the last couple of weeks has felt different.  Tired and weak-  had [name] couple of falls -  hurt hip initially , then ribs-  nausea had been present but has intensified, same with edema-  labs in ER today - crt 7 and potassium of 7-  has received insulin /dextrose /calcium /lokelma /bicarb-  is not bradycardic.  BP if anything is high.  She is conversive.  Says she hasn't had many conversations about dialysis but knows at least the basics.  Has not considered not doing it, just wants to make sure she can be set up in [place] so her family can help  Creatinine  Date/Time Value Ref Range Status  11/10/2023 12:00 AM 3.4 ([name]) 0.5 - 1.1 Final  06/13/2022 10:21 AM 3.05 (HH) 0.60 - 1.20 mg/dL Final    Comment:    CRITICAL RESULT CALLED TO, READ BACK BY AND VERIFIED WITH: DR.MCCARTY AT 1952 ON 01.03.24 BY N.THOMPSON   06/06/2022 09:17 AM 2.97 (H) 0.44 - 1.00 mg/dL Final  40/98/1191 47:82 AM 2.58 (H) 0.44 - 1.00 mg/dL Final  95/62/1308 65:78 AM 2.69 (H) 0.44 - 1.00 mg/dL Final  46/96/2952 84:13 AM 2.62 (H) 0.44 - 1.00 mg/dL Final  24/40/1027 25:36 AM 2.86 (H) 0.44 - 1.00 mg/dL Final   Creatinine, Ser  Date/Time Value Ref Range Status  11/29/2023 12:53 PM 6.95 (H) 0.44 - 1.00 mg/dL Final  64/40/3474 25:95 AM 7.09 (HH) 0.57 - 1.00 mg/dL Final    Comment:                      Client Requested Flag  11/18/2023 11:48 AM 3.55 (H) 0.57 - 1.00 mg/dL Final  63/87/5643 32:95 AM  4.20 (H) 0.57 - 1.00 mg/dL Final  18/84/1660 63:01 AM 4.24 (H) 0.57 - 1.00 mg/dL Final  60/03/9322 55:73 AM 3.33 (H) 0.57 - 1.00 mg/dL Final  22/07/5425 06:23 AM 2.82 (H) 0.57 - 1.00 mg/dL Final  76/28/3151 76:16 AM 2.79 (H) 0.57 - 1.00 mg/dL Final  07/37/1062 69:48 AM 3.02 (H) 0.57 - 1.00 mg/dL Final  54/62/7035 00:93 AM 3.74 (H) 0.57 - 1.00 mg/dL Final  81/82/9937 16:96 AM 3.94 (H) 0.57 - 1.00 mg/dL Final  78/93/8101 75:10 AM 4.01 (H) 0.57 - 1.00 mg/dL Final  25/85/2778 24:23 PM 3.62 (H) 0.57 - 1.00 mg/dL Final  53/61/4431 54:00 AM 3.87 (H) 0.57 - 1.00 mg/dL Final  86/76/1950 93:26 AM 3.50 (H) 0.44 - 1.00 mg/dL Final  71/24/5809 98:33 AM 3.75 (H) 0.44 - 1.00 mg/dL Final  82/50/5397 67:34 AM 3.96 (H) 0.44 - 1.00 mg/dL Final  19/37/9024 09:73 AM 4.25 (H) 0.44 - 1.00 mg/dL Final  53/29/9242 68:34 AM 4.81 (H) 0.44 - 1.00 mg/dL Final  19/62/2297 98:92 AM 5.13 (H) 0.44 - 1.00 mg/dL Final  11/94/1740 81:44 AM 5.47 (H) 0.44 - 1.00 mg/dL Final  81/85/6314 97:02 AM 6.08 (H) 0.44 - 1.00 mg/dL Final  63/78/5885 02:77 AM 6.57 (  H) 0.44 - 1.00 mg/dL Final  16/03/9603 54:09 PM 6.61 (H) 0.44 - 1.00 mg/dL Final  81/19/1478 29:56 AM 6.87 (H) 0.44 - 1.00 mg/dL Final  21/30/8657 84:69 AM 6.81 (H) 0.44 - 1.00 mg/dL Final  62/95/2841 32:44 PM 6.74 (H) 0.44 - 1.00 mg/dL Final  06/13/7251 66:44 PM 6.88 (H) 0.44 - 1.00 mg/dL Final  03/47/4259 56:38 AM 6.84 (HH) 0.57 - 1.00 mg/dL Final    Comment:                      Client Requested Flag  07/31/2022 02:15 PM 3.68 (H) 0.57 - 1.00 mg/dL Final  75/64/3329 51:88 PM 3.58 (H) 0.57 - 1.00 mg/dL Final  41/66/0630 16:01 AM 3.52 (H) 0.57 - 1.00 mg/dL Final  09/32/3557 32:20 PM 2.72 (H) 0.57 - 1.00 mg/dL Final  25/42/7062 37:62 AM 2.54 (H) 0.57 - 1.00 mg/dL Final  83/15/1761 60:73 AM 1.95 (H) 0.57 - 1.00 mg/dL Final  71/11/2692 85:46 AM 2.12 (H) 0.57 - 1.00 mg/dL Final  27/08/5007 38:18 AM 2.44 (H) 0.57 - 1.00 mg/dL Final  29/93/7169 67:89 AM 2.84 (H) 0.57 - 1.00  mg/dL Final  38/03/1750 02:58 AM 2.02 (H) 0.57 - 1.00 mg/dL Final  52/77/8242 35:36 AM 1.69 (H) 0.57 - 1.00 mg/dL Final  14/43/1540 08:67 AM 1.97 (H) 0.57 - 1.00 mg/dL Final  61/95/0932 67:12 AM 1.81 (H) 0.57 - 1.00 mg/dL Final  45/80/9983 38:25 AM 1.91 (H) 0.57 - 1.00 mg/dL Final  05/39/7673 41:93 AM 1.68 (H) 0.57 - 1.00 mg/dL Final  79/07/4095 35:32 AM 1.90 (H) 0.57 - 1.00 mg/dL Final  99/24/2683 41:96 AM 1.78 (H) 0.57 - 1.00 mg/dL Final  22/29/7989 21:19 PM 1.53 (H) 0.44 - 1.00 mg/dL Final     PMHx:   Past Medical History:  Diagnosis Date   CKD (chronic kidney disease), stage IV (HCC)    COPD (chronic obstructive pulmonary disease) (HCC)    GERD (gastroesophageal reflux disease)    Hyperlipidemia    Hyperparathyroidism, 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 83 yo   BACK SURGERY  2016   lower L 4 to L 5   bladder tack and uterus lift  10/10/2015   CHOLECYSTECTOMY     summer of 2022   CYSTOSCOPY WITH RETROGRADE PYELOGRAM, URETEROSCOPY AND STENT PLACEMENT Right 08/04/2022   Procedure: CYSTOSCOPY WITH RIGHT URETERAL STENT EXCHANGE;  Surgeon: Mallie Seal, MD;  Location[facility];  Service: Urology;  Laterality: Right;   IR RADIOLOGIST EVAL & MGMT  12/03/2018   IR RADIOLOGIST EVAL & MGMT  12/31/2018   IR REMOVAL TUN ACCESS W/ PORT W/O FL MOD SED  08/08/2022   PARATHYROIDECTOMY Left 07/02/2016   Procedure: LEFT INFERIOR PARATHYROIDECTOMY;  Surgeon: Oralee Billow, MD;  Location: [facility] ORS;  Service: General;  Laterality: Left;   SHOULDER OPEN ROTATOR CUFF REPAIR  12/2019   TEE WITHOUT CARDIOVERSION N/[name] 08/10/2022   Procedure: TRANSESOPHAGEAL ECHOCARDIOGRAM (TEE);  Surgeon: Hazle Lites, MD;  Location: [facility] ENDOSCOPY;  Service:  Cardiovascular;  Laterality: N/[name];   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    Family Hx:  Family History  Problem Relation Age of Onset   Emphysema Mother    Hyperlipidemia Sister  Hypertension Sister    COPD Sister    Hyperlipidemia Sister    Hypertension Sister    Lymphoma Sister    Obesity Brother    Hyperlipidemia Brother    Hypertension Brother    Breast cancer Neg Hx     Social History:  reports that she quit smoking about 18 years ago. Her smoking use included cigarettes. She started smoking about 68 years ago. She has [name] 50 pack-year smoking history. She has never used smokeless tobacco. She reports that she does not drink alcohol and does not use drugs.  Allergies:  Allergies  Allergen Reactions   Contrast Media [Iodinated Contrast Media] Hives, Itching and Rash   Omeprazole  Itching   Shellfish Allergy Nausea And Vomiting   Shellfish-Derived Products Nausea And Vomiting    Medications: Prior to Admission medications   Medication Sig Start Date End Date Taking? Authorizing Provider  amLODipine  (NORVASC ) 10 MG tablet TAKE 1 TABLET(10 MG) BY MOUTH DAILY 01/11/23   Cox, Kirsten, MD  amoxicillin  (AMOXIL ) 500 MG capsule Take 1 capsule (500 mg total) by mouth 2 (two) times daily for 7 days. 11/22/23 11/29/23  CoxBurleigh Carp, MD  atorvastatin  (LIPITOR) 40 MG tablet TAKE 1 TABLET BY MOUTH EVERY DAY 10/13/23   Cox, Burleigh Carp, MD  Cholecalciferol (VITAMIN D) 125 MCG (5000 UT) CAPS Take 1 capsule by mouth daily.    [provider]  cyanocobalamin  (VITAMIN B12) 1000 MCG/ML injection Inject 1 mL (1,000 mcg total) into the muscle every 14 (fourteen) days. 02/13/23   CoxBurleigh Carp, MD  ferrous sulfate  325 (65 FE) MG tablet Take 1 tablet (325 mg total) by mouth daily. 11/11/23 02/09/24  Janece Means, FNP  hydrALAZINE  (APRESOLINE ) 50 MG tablet Take 1 tablet (50 mg total) by mouth 3 (three) times daily. 11/18/23   Janece Means, FNP  levothyroxine  (SYNTHROID ) 100 MCG tablet Take 1  tablet (100 mcg total) by mouth daily. 11/21/23 02/19/24  Janece Means, FNP  nitroGLYCERIN  (NITROSTAT ) 0.4 MG SL tablet Place 1 tablet (0.4 mg total) under the tongue every 5 (five) minutes as needed for chest pain. 07/22/23   CoxBurleigh Carp, MD  ondansetron  (ZOFRAN -ODT) 4 MG disintegrating tablet Take 1 tablet (4 mg total) by mouth every 8 (eight) hours as needed for nausea or vomiting. 09/06/23   Sirivol, Mamatha, MD  oxyCODONE  (OXY IR/ROXICODONE ) 5 MG immediate release tablet Take 5 mg by mouth every 6 (six) hours as needed. 11/26/23   [provider]  rOPINIRole  (REQUIP ) 2 MG tablet TAKE 1 TABLET(2 MG) BY MOUTH TWICE DAILY 10/20/23   Cox, Kirsten, MD  torsemide  (DEMADEX ) 20 MG tablet Take 1 tablet (20 mg total) by mouth daily as needed (edema). 09/17/22   Mercy Stall, MD  VENTOLIN  HFA 108 [phone] Base) MCG/ACT inhaler Inhale 1-2 puffs into the lungs every 6 (six) hours as needed for wheezing or shortness of breath. 07/31/22   Mercy Stall, MD    I have reviewed the patient's current medications.  Labs:  Results for orders placed or performed during the hospital encounter of 11/29/23 (from the past 48 hours)  Brain natriuretic peptide     Status: Abnormal   Collection Time: 11/29/23 12:51 PM  Result Value Ref Range   B Natriuretic Peptide 1,321.3 (H) 0.0 - 100.0 pg/mL    Comment: Performed at [facility], [address], [place], [place] [address]  Comprehensive metabolic panel     Status: Abnormal   Collection Time: 11/29/23 12:53 PM  Result  Value Ref Range   Sodium 136 135 - 145 mmol/L   Potassium 7.0 (HH) 3.5 - 5.1 mmol/L    Comment: CRITICAL RESULT CALLED TO, READ BACK BY AND VERIFIED WITH [name],PAXTON RN @1400  11/29/23 E,[place]   Chloride 107 98 - 111 mmol/L   CO2 16 (L) 22 - 32 mmol/L   Glucose, Bld 95 70 - 99 mg/dL    Comment: Glucose reference range applies only to samples taken after fasting for at least 8 hours.   BUN 104 (H) 8 - 23 mg/dL   Creatinine, Ser 1.19 (H) 0.44 - 1.00  mg/dL   Calcium  8.3 (L) 8.9 - 10.3 mg/dL   Total Protein 6.5 6.5 - 8.1 g/dL   Albumin  2.8 (L) 3.5 - 5.0 g/dL   AST 14 (L) 15 - 41 U/L   ALT 16 0 - 44 U/L   Alkaline Phosphatase 71 38 - 126 U/L   Total Bilirubin 0.6 0.0 - 1.2 mg/dL   GFR, Estimated 6 (L) >60 mL/min    Comment: (NOTE) Calculated using the CKD-EPI Creatinine Equation (2021)    Anion gap 13 5 - 15    Comment: Performed at [facility], [address], [place], [place] [address]  CBC with Differential     Status: Abnormal   Collection Time: 11/29/23  1:16 PM  Result Value Ref Range   WBC 15.9 (H) 4.0 - 10.5 K/uL   RBC 2.63 (L) 3.87 - 5.11 MIL/uL   Hemoglobin 8.1 (L) 12.0 - 15.0 g/dL   HCT 95.6 (L) 21.3 - 08.6 %   MCV 101.9 (H) 80.0 - 100.0 fL   MCH 30.8 26.0 - 34.0 pg   MCHC 30.2 30.0 - 36.0 g/dL   RDW 57.8 (H) 46.9 - 62.9 %   Platelets 481 (H) 150 - 400 K/uL   nRBC 0.0 0.0 - 0.2 %   Neutrophils Relative % 87 %   Neutro Abs 13.8 (H) 1.7 - 7.7 K/uL   Lymphocytes Relative 6 %   Lymphs Abs 1.0 0.7 - 4.0 K/uL   Monocytes Relative 5 %   Monocytes Absolute 0.8 0.1 - 1.0 K/uL   Eosinophils Relative 0 %   Eosinophils Absolute 0.0 0.0 - 0.5 K/uL   Basophils Relative 0 %   Basophils Absolute 0.0 0.0 - 0.1 K/uL   Immature Granulocytes 2 %   Abs Immature Granulocytes 0.24 (H) 0.00 - 0.07 K/uL    Comment: Performed at [facility], [address], [place], [place] [address]  Troponin I (High Sensitivity)     Status: Abnormal   Collection Time: 11/29/23  1:16 PM  Result Value Ref Range   Troponin I (High Sensitivity) 24 (H) <18 ng/L    Comment: (NOTE) Elevated high sensitivity troponin I (hsTnI) values and significant  changes across serial measurements may suggest ACS but many other  chronic and acute conditions are known to elevate hsTnI results.  Refer to the Links section for chest pain algorithms and additional  guidance. Performed at [facility], [address] [place], [place] [address]       ROS:  [name] comprehensive review of systems was negative except for: Constitutional: positive for fatigue Cardiovascular: positive for dyspnea and lower extremity edema Gastrointestinal: positive for nausea and vomiting  Physical Exam: Vitals:   11/29/23 1330 11/29/23 1345  BP: (!) 178/74   Pulse: 90 94  Resp: (!) 24 17  Temp:    SpO2: 100% 100%  General:  pretty well appearing WF-  NAD HEENT: PERRLA, EOMI, mucous membranes moist  Neck: no JVD Heart: RRR to tachy Lungs: clear-  poor effort due to rib fractures-  rib pain  Abdomen: soft, non tender Extremities: trace to 1+ edema Skin: warm and dry Neuro: alert.  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   I figured it was time  even though she is 70 she is not extremely debilitated.  Understands at least the time commitment and some issues related to incenter HD.  Did not really consider not doing it.  Given K of 7 and most likely uremic sxms , I think it may be best to go ahead and start dialysis-  she is agreeable-  IR thinks they will be able to place at least temp line this afternoon and I have talked with dialysis to get her treatment prioritized tonight-  Will also check urinalysis and kidney imaging to make sure no issue there that might be reversible 2. Hypertension/volume  - BP is highish and she is volume overloaded-  UF as able with HD-  also continue her home BP meds  3. Hyperkalemia-  got short term tx this afternoon-  calcium /insulin /bicarb and lokelma -  plan is to do dialysis once catheter is placed-  will check [name] k later as well if this plan is delayed 4. Anemia  - hgb in the 8's. Will check iron and add ESA    Alexandra [name]  Alexandra [name] 11/29/2023, 2:28 PM

## 2023-11-29 NOTE — Assessment & Plan Note (Signed)
 CAME BACK AT 20 ON FRIDAY AM AND PATIENT SENT BY EMS TO [place] ED ON 11/29/2023 AT 11 AM.

## 2023-11-29 NOTE — Assessment & Plan Note (Signed)
 No surgery recommended.  See ED visit under MEDIA tab.

## 2023-11-29 NOTE — Progress Notes (Signed)
 Pharmacy Antibiotic Note  Alexandra [name] is a 83 y.o. female admitted on 11/29/2023 with sepsis.  Pharmacy has been consulted for vancomycin and cefepime dosing. Plan to start iHD  Plan: Vancomycin 1250 mg Iv x 1, then variable dosing pending new HD start Cefepime 2g IV x 1, then 1g IV q 24h Monitor HD start, Cx and clinical progression to narrow Vancomycin random level as needed  Height: 5' 2 (157.5 cm) Weight: 63.5 kg (139 lb 15.9 oz) IBW/kg (Calculated) : 50.1  Temp (24hrs), Avg:98.1 F (36.7 C), Min:98 F (36.7 C), Max:98.1 F (36.7 C)  Recent Labs  Lab 11/28/23 0839 11/29/23 1253 11/29/23 1316  WBC 20.8*  --  15.9*  CREATININE 7.09* 6.95*  --     Estimated Creatinine Clearance: 5.7 mL/min (A) (by C-G formula based on SCr of 6.95 mg/dL (H)).    Allergies  Allergen Reactions   Contrast Media [Iodinated Contrast Media] Hives, Itching and Rash   Omeprazole  Itching   Shellfish Allergy Nausea And Vomiting   Shellfish-Derived Products Nausea And Vomiting    Trinidad Funk, PharmD, [facility] Clinical Pharmacist ED Pharmacist Phone # [phone] 11/29/2023 5:53 PM

## 2023-11-29 NOTE — ED Triage Notes (Signed)
 Pt here from PCP, they did blood work yesterday, CRT is at 7. 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 C/O CP for broken ribs on right side.

## 2023-11-29 NOTE — ED Notes (Signed)
 Changed patients Brief- urine only

## 2023-11-29 NOTE — H&P (Addendum)
 History and Physical    Alexandra [name] WGN:[id number] DOB: [birth year] DOA: 11/29/2023  DOS: the patient was seen and examined on 11/29/2023  PCP: Mercy Stall, MD   Patient coming from: Home  I have personally briefly reviewed patient's old medical records in [facility].  HPI:   Alexandra [name] is a 83 y.o. year old female with past medical history of stage IV/V CKD, CHF, COPD, essential hypertension, hyperlipidemia, restless leg syndrome, renal cell carcinoma status post left total nephrectomy 1999, (R) hydronephrosis s/p stenting that is exchanged every 3 months, anemia of chronic kidney disease, GERD, gout, urinary incontinence, hypothyroidism, and recurrent UTIs.  She presents to [facility] at the advice of her primary care for abnormal lab results. Yesterday her creatinine was 7.09.  The patient denies any recent symptoms or complaints.  However upon chart review it does appear that she has had decreased appetite and nausea since at least April of this year.  She denies vomiting, fever, chills, fatigue, myalgias.  She reports that she does continue to make urine and has been incontinent of urine for at least a year.  She was recently treated for a UTI and switched to amoxicillin , at this time she denies dysuria, urinary frequency urgency, or hematuria.  She denies any change in volume overload symptomatology.  She notes chronic lower extremity edema with current trace bilateral ankle edema.  She denies dyspnea, chest pain, or vomiting.  Of note she was recently admitted 11/25/23 at [facility] with right hip pain and noted to have a pelvic fracture during that admission she had a fall resulting in right-sided rib fractures.   ED Course: On arrival to [facility] ED patient was noted to be afebrile temp 36.7 C, BP 170/71, HR 99, RR 18, SpO2 99% on room air.  CXR obtained and shows no acute cardiopulmonary disease. Labs notable for BMP 1321.3, potassium 7.0, CO2  16, creatinine 6.95, calcium  8.3 (corrects to 9.3), albumin  2.8, WBC 15.9, hemoglobin 8.1, platelets 481, initial troponin 24 repeat pending.  Nephrology called by EDP, they are planning dialysis line placement and subsequent dialysis. [facility] contacted for admission.  Review of Systems: As mentioned in the history of present illness. All other systems reviewed and are negative.  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 83 yo   BACK SURGERY  2016   lower L 4 to L 5   bladder tack and uterus lift  10/10/2015   CHOLECYSTECTOMY     summer of 2022   CYSTOSCOPY WITH RETROGRADE PYELOGRAM, URETEROSCOPY AND STENT PLACEMENT Right 08/04/2022   Procedure: CYSTOSCOPY WITH RIGHT URETERAL STENT EXCHANGE;  Surgeon: Mallie Seal, MD;  Location: [facility] OR;  Service: Urology;  Laterality: Right;   IR RADIOLOGIST EVAL & MGMT  12/03/2018   IR RADIOLOGIST EVAL & MGMT  12/31/2018   IR REMOVAL TUN ACCESS W/ PORT W/O FL MOD SED  08/08/2022   PARATHYROIDECTOMY Left 07/02/2016   Procedure: LEFT INFERIOR PARATHYROIDECTOMY;  Surgeon: Oralee Billow, MD;  Location: [facility] ORS;  Service: General;  Laterality: Left;   SHOULDER OPEN ROTATOR CUFF REPAIR  12/2019   TEE WITHOUT CARDIOVERSION N/A 08/10/2022   Procedure: TRANSESOPHAGEAL ECHOCARDIOGRAM (TEE);  Surgeon: Hazle Lites, MD;  Location: [facility] ENDOSCOPY;  Service: Cardiovascular;  Laterality: N/A;   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     reports that she quit smoking about 18 years ago. Her smoking  use included cigarettes. She started smoking about 68 years ago. She has a 50 pack-year smoking history. She has never used smokeless tobacco. She reports that she does not drink alcohol and does not use drugs.  Allergies  Allergen Reactions   Contrast Media [Iodinated Contrast Media] Hives, Itching and Rash   Omeprazole  Itching   Shellfish Allergy Nausea And Vomiting   Shellfish-Derived Products Nausea And Vomiting    Family History  Problem Relation Age of Onset   Emphysema Mother    Hyperlipidemia Sister    Hypertension Sister    COPD Sister    Hyperlipidemia Sister    Hypertension Sister    Lymphoma Sister    Obesity Brother    Hyperlipidemia Brother    Hypertension Brother    Breast cancer Neg Hx     Prior to Admission medications   Medication Sig Start Date End Date Taking? Authorizing Provider  amLODipine  (NORVASC ) 10 MG tablet TAKE 1 TABLET(10 MG) BY MOUTH DAILY 01/11/23   Cox, Kirsten, MD  amoxicillin  (AMOXIL ) 500 MG capsule Take 1 capsule (500 mg total) by mouth 2 (two) times daily for 7 days. 11/22/23 11/29/23  CoxBurleigh Carp, MD  atorvastatin  (LIPITOR) 40 MG tablet TAKE 1 TABLET BY MOUTH EVERY DAY 10/13/23   Cox, Kirsten, MD  Cholecalciferol (VITAMIN D) 125 MCG (5000 UT) CAPS Take 1 capsule by mouth daily.    [provider]  cyanocobalamin  (VITAMIN B12) 1000 MCG/ML injection Inject 1 mL (1,000 mcg total) into the muscle every 14 (fourteen) days. 02/13/23   CoxBurleigh Carp, MD  ferrous sulfate  325 (65 FE) MG tablet Take 1 tablet (325 mg total) by mouth daily. 11/11/23 02/09/24  Janece Means, FNP  hydrALAZINE  (APRESOLINE ) 50 MG tablet Take 1 tablet (50 mg total) by mouth 3 (three) times daily. 11/18/23   Janece Means, FNP  levothyroxine  (SYNTHROID ) 100 MCG tablet Take 1 tablet (100 mcg total) by mouth daily. 11/21/23 02/19/24  Janece Means, FNP  nitroGLYCERIN  (NITROSTAT ) 0.4 MG SL tablet Place 1 tablet (0.4 mg total) under the tongue every 5 (five) minutes as needed for chest  pain. 07/22/23   CoxBurleigh Carp, MD  ondansetron  (ZOFRAN -ODT) 4 MG disintegrating tablet Take 1 tablet (4 mg total) by mouth every 8 (eight) hours as needed for nausea or vomiting. 09/06/23   Sirivol, Mamatha, MD  oxyCODONE  (OXY IR/ROXICODONE ) 5 MG immediate release tablet Take 5 mg by mouth every 6 (six) hours as needed. 11/26/23   [provider]  rOPINIRole  (REQUIP ) 2 MG tablet TAKE 1 TABLET(2 MG) BY MOUTH TWICE DAILY 10/20/23   Cox, Kirsten, MD  torsemide  (DEMADEX ) 20 MG tablet Take 1 tablet (20 mg total) by mouth daily as needed (edema). 09/17/22   Mercy Stall, MD  VENTOLIN  HFA 108 [phone] Base) MCG/ACT inhaler Inhale 1-2 puffs into the lungs every 6 (six) hours as needed for wheezing or shortness of breath. 07/31/22   Mercy Stall, MD    Physical Exam: Vitals:   11/29/23 1515 11/29/23 1530 11/29/23 1545 11/29/23 1600  BP: Aaron Aas)  164/68 (!) 173/109 (!) 162/73 (!) 166/73  Pulse: (!) 116 (!) 125 (!) 113 (!) 110  Resp: (!) 21 (!) 28 18 15   Temp:      SpO2: 99% 99% 99% 98%  Weight:      Height:        Physical Exam   Constitutional: Elderly Caucasian lady, mildly anxious, cooperative Eyes: PERRL, lids and conjunctivae normal Respiratory: clear to auscultation bilaterally, decreased air movement on (R), no wheezing, no crackles. Normal respiratory effort. No accessory muscle use.  Cardiovascular: Regular rate and rhythm, no murmurs / rubs / gallops.  Trace bilateral ankle edema. 2+ radial and pedal pulses. (R) chest wall tenderness on palpation Abdomen: Soft, nontender no tenderness. Bowel sounds positive x4 quadrants.  Musculoskeletal: No joint deformity upper and lower extremities. Good ROM, no contractures. Normal muscle tone.  Skin: Warm, dry.  No rashes, lesions, ulcers.  Bruising noted to (L) hand patient reports secondary to IV stick for lab work Neurologic:  Alert and oriented x 3. Normal mood.    Labs on Admission: I have personally reviewed following labs and imaging  studies  CBC: Recent Labs  Lab 11/28/23 0839 11/29/23 1316  WBC 20.8* 15.9*  NEUTROABS 17.7* 13.8*  HGB 8.6* 8.1*  HCT 27.3* 26.8*  MCV 98* 101.9*  PLT 503* 481*   Basic Metabolic Panel: Recent Labs  Lab 11/28/23 0839 11/29/23 1253  NA 137 136  K CANCELED 7.0*  CL 105 107  CO2 10* 16*  GLUCOSE 104* 95  BUN 96* 104*  CREATININE 7.09* 6.95*  CALCIUM  8.6* 8.3*   GFR: Estimated Creatinine Clearance: 5.7 mL/min (A) (by C-G formula based on SCr of 6.95 mg/dL (H)). Liver Function Tests: Recent Labs  Lab 11/28/23 0839 11/29/23 1253  AST 13 14*  ALT 13 16  ALKPHOS 116 71  BILITOT 0.2 0.6  PROT 6.6 6.5  ALBUMIN  3.8 2.8*   No results for input(s): LIPASE, AMYLASE in the last 168 hours. No results for input(s): AMMONIA in the last 168 hours. Coagulation Profile: No results for input(s): INR, PROTIME in the last 168 hours. Cardiac Enzymes: Recent Labs  Lab 11/29/23 1316  TROPONINIHS 24*   BNP (last 3 results) Recent Labs    11/29/23 1251  BNP 1,321.3*   HbA1C: No results for input(s): HGBA1C in the last 72 hours. CBG: No results for input(s): GLUCAP in the last 168 hours. Lipid Profile: Recent Labs    11/28/23 0839  CHOL 164  HDL 63  LDLCALC 86  TRIG 82  CHOLHDL 2.6   Thyroid  Function Tests: No results for input(s): TSH, T4TOTAL, FREET4, T3FREE, THYROIDAB in the last 72 hours. Anemia Panel: No results for input(s): VITAMINB12, FOLATE, FERRITIN, TIBC, IRON, RETICCTPCT in the last 72 hours. Urine analysis:    Component Value Date/Time   COLORURINE YELLOW 08/04/2022 0257   APPEARANCEUR HAZY (A) 08/04/2022 0257   LABSPEC 1.010 08/04/2022 0257   PHURINE 5.0 08/04/2022 0257   GLUCOSEU NEGATIVE 08/04/2022 0257   HGBUR MODERATE (A) 08/04/2022 0257   BILIRUBINUR negative 11/28/2023 0923   KETONESUR negative 11/28/2023 0923   KETONESUR NEGATIVE 08/04/2022 0257   PROTEINUR 100 (A) 08/04/2022 0257   UROBILINOGEN 0.2  11/28/2023 0923   NITRITE Negative 11/28/2023 0923   NITRITE NEGATIVE 08/04/2022 0257   LEUKOCYTESUR Large (3+) (A) 11/28/2023 0923   LEUKOCYTESUR LARGE (A) 08/04/2022 0257    Radiological Exams on Admission: I have personally reviewed images No results found.  EKG: My personal interpretation of EKG  shows: Normal sinus rhythm, HR 90.  No conduction abnormalities    Assessment/Plan Principal Problem:   Acute renal failure superimposed on stage 4 chronic kidney disease (HCC)    ##Acute renal failure superimposed on stage IV/V chronic kidney disease ##Metabolic acidosis - Nephrology consulted, appreciate their management and recommendations - Nephrology planning for dialysis line placement today and subsequent iHD - Avoid nephrotoxins, contrast Dyes, Hypotension and Dehydration.  Patient recently treated for UTI with amoxicillin , though AKIs are generally associated with high-dose IV amoxicillin  AKIs can be seen at therapeutic p.o. doses. - Renally Adjust Meds - Renal ultrasound - Check magnesium  and phosphorus - Check urine sodium and urine protein creatinine ratio - Continue to Monitor and Trend Renal Function carefully and repeat metabolic panel with a.m. labs  ##Hyperkalemia Received calcium , insulin , D50 and Lokelma  in the ED - Nephrology planning for dialysis line placement today and subsequent iHD  ##SIRS criteria Patient meets SIRS criteria with tachycardia, tachypnea and leukocytosis. Recently treated for UTI however she denies dysuria or any symptoms at this time. Denies abdominal pain. Clinically looks well and nontoxic-appearing. Hemodynamically stable. CXR without consolidation or infiltrates.  However admission in February 2024 she had a similar presentation and she was found to have Enterococcus faecalis bacteremia and became hypotensive while inpatient.  Will cover empirically pending blood culture results. - Cover empirically with cefepime and vancomycin - UA with  reflex - Blood cultures - MRSA swab  #Recent (R) rib fractures - As needed analgesia - Incentive spirometer - Abdominal binder  #COPD - Continue home bronchodilators - PRN nebulizers when patient  #CHF BNP elevated at 1321.3. Patient has trace bilateral ankle edema and clear breath sounds, no dyspnea. - Hold home torsemide  for now - Volume management with dialysis per nephrology  #Hypertension - Continue home hydralazine  and amlodipine  - Hold home torsemide   #Hyperlipidemia - Continue home atorvastatin   #Anemia of chronic disease - Continue home iron - Repeat CBC with a.m. labs  #Hypothyroidism - Continue home Synthroid   #Restless leg syndrome - Continue home Requip   VTE prophylaxis:  SQ Heparin   GI prophylaxis: Protonix  Diet: Renal with 1500 mL fluid restriction Access: PIV Lines: None Code Status: DNR/DNI confirmed with patient Telemetry: Yes Disposition: Admit to progressive  Admission status: Inpatient, progressive Patient is from: Home Anticipated d/c is to: Home Anticipated d/c date is: 2-3 days Patient currently: Pending dialysis line placement and initiation of dialysis  Family Communication: Attempted to contact husband number listed in chart, no answer.  Sent by be reached at [phone] updated.  Consults called: Nephrology called by EDP   Severity of Illness: The appropriate patient status for this patient is INPATIENT. Inpatient status is judged to be reasonable and necessary in order to provide the required intensity of service to ensure the patient's safety. The patient's presenting symptoms, physical exam findings, and initial radiographic and laboratory data in the context of their chronic comorbidities is felt to place them at high risk for further clinical deterioration. Furthermore, it is not anticipated that the patient will be medically stable for discharge from the hospital within 2 midnights of admission.   * I certify that at the point  of admission it is my clinical judgment that the patient will require inpatient hospital care spanning beyond 2 midnights from the point of admission due to high intensity of service, high risk for further deterioration and high frequency of surveillance required.*  To reach the provider On-Call:   7AM- 7PM see care teams to locate the  attending and reach out to them via [email or web address] Password: [email or web address] 7PM-7AM contact night-coverage If you still have difficulty reaching the appropriate provider, please page the [facility] (Director on Call) for [facility] on amion for assistance  This document was prepared using Conservation officer, historic buildings and may include unintentional dictation errors.  Naida Austria FNP-BC, PMHNP-BC Nurse Practitioner [facility]

## 2023-11-29 NOTE — Assessment & Plan Note (Signed)
 Sees nephrology.  LABS CAME BACK WITH GFR TO 5 AND CREATININE >7.  SENT BY EMS TO [place] ED ON 11/29/2023 AT 11 AM.

## 2023-11-29 NOTE — ED Provider Notes (Signed)
 [facility] Provider Note   CSN: [id number] Arrival date & time: 11/29/23  1226     Patient presents with: Hypertension  HPI Alexandra [name] is a 83 y.o. female with stage IV CKD, s/p right nephrectomy in 1999, CHF, COPD presenting presenting for abnormal lab.  Had routine labs checked yesterday at her PCP clinic.  She was informed that her creatinine was 7.  Thus advised to come here for further evaluation.  She is incontinent of urine for the past year.  Denies any flank or abdominal pain.  She was recently admitted to the hospital and fell and broke multiple ribs on the right side.  She does report a bruising to the right breast.  Also reports that she is short of breath and has pain in the right side of her chest.    Hypertension      Prior to Admission medications   Medication Sig Start Date End Date Taking? Authorizing Provider  amLODipine  (NORVASC ) 10 MG tablet TAKE 1 TABLET(10 MG) BY MOUTH DAILY Patient taking differently: Take 10 mg by mouth daily. 01/11/23   CoxAbigail, MD  amoxicillin  (AMOXIL ) 500 MG capsule Take 1 capsule (500 mg total) by mouth 2 (two) times daily for 7 days. 11/22/23 11/29/23  CoxAbigail, MD  atorvastatin  (LIPITOR) 40 MG tablet TAKE 1 TABLET BY MOUTH EVERY DAY 10/13/23   Cox, Abigail, MD  Cholecalciferol (VITAMIN D) 125 MCG (5000 UT) CAPS Take 1 capsule by mouth daily.    [provider]  cyanocobalamin  (VITAMIN B12) 1000 MCG/ML injection Inject 1 mL (1,000 mcg total) into the muscle every 14 (fourteen) days. 02/13/23   CoxAbigail, MD  ferrous sulfate  325 (65 FE) MG tablet Take 1 tablet (325 mg total) by mouth daily. 11/11/23 02/09/24  Teressa Harrie CHRISTELLA, FNP  hydrALAZINE  (APRESOLINE ) 50 MG tablet Take 1 tablet (50 mg total) by mouth 3 (three) times daily. 11/18/23   Teressa Harrie CHRISTELLA, FNP  levothyroxine  (SYNTHROID ) 100 MCG tablet Take 1 tablet (100 mcg total) by mouth daily. 11/21/23 02/19/24  Teressa Harrie CHRISTELLA, FNP   nitroGLYCERIN  (NITROSTAT ) 0.4 MG SL tablet Place 1 tablet (0.4 mg total) under the tongue every 5 (five) minutes as needed for chest pain. 07/22/23   CoxAbigail, MD  ondansetron  (ZOFRAN -ODT) 4 MG disintegrating tablet Take 1 tablet (4 mg total) by mouth every 8 (eight) hours as needed for nausea or vomiting. 09/06/23   Sirivol, Mamatha, MD  oxyCODONE  (OXY IR/ROXICODONE ) 5 MG immediate release tablet Take 5 mg by mouth every 6 (six) hours as needed. 11/26/23   [provider]  rOPINIRole  (REQUIP ) 2 MG tablet TAKE 1 TABLET(2 MG) BY MOUTH TWICE DAILY Patient taking differently: Take 2 mg by mouth in the morning and at bedtime. 10/20/23   CoxAbigail, MD  torsemide  (DEMADEX ) 20 MG tablet Take 1 tablet (20 mg total) by mouth daily as needed (edema). 09/17/22   CoxAbigail, MD  VENTOLIN  HFA 108 [phone] Base) MCG/ACT inhaler Inhale 1-2 puffs into the lungs every 6 (six) hours as needed for wheezing or shortness of breath. 07/31/22   CoxAbigail, MD    Allergies: Contrast media [iodinated contrast media], Omeprazole , Shellfish allergy, and Shellfish-derived products    Review of Systems See HPI  Updated Vital Signs BP (!) 164/68   Pulse (!) 116   Temp 98 F (36.7 C)   Resp (!) 21   Ht 5' 2 (1.575 m)   Wt 63.5  kg   SpO2 99%   BMI 25.60 kg/m   Physical Exam Vitals and nursing note reviewed. Exam conducted with a chaperone present.  HENT:     Head: Normocephalic and atraumatic.     Mouth/Throat:     Mouth: Mucous membranes are moist.   Eyes:     General:        Right eye: No discharge.        Left eye: No discharge.     Conjunctiva/sclera: Conjunctivae normal.    Cardiovascular:     Rate and Rhythm: Normal rate and regular rhythm.     Pulses: Normal pulses.     Heart sounds: Normal heart sounds.  Pulmonary:     Effort: Pulmonary effort is normal.     Breath sounds: Normal breath sounds.  Chest:   Abdominal:     General: Abdomen is flat.     Palpations: Abdomen is soft.    Skin:    General: Skin is warm and dry.   Neurological:     General: No focal deficit present.   Psychiatric:        Mood and Affect: Mood normal.     (all labs ordered are listed, but only abnormal results are displayed) Labs Reviewed  CBC WITH DIFFERENTIAL/PLATELET - Abnormal; Notable for the following components:      Result Value   WBC 15.9 (*)    RBC 2.63 (*)    Hemoglobin 8.1 (*)    HCT 26.8 (*)    MCV 101.9 (*)    RDW 16.4 (*)    Platelets 481 (*)    Neutro Abs 13.8 (*)    Abs Immature Granulocytes 0.24 (*)    All other components within normal limits  BRAIN NATRIURETIC PEPTIDE - Abnormal; Notable for the following components:   B Natriuretic Peptide 1,321.3 (*)    All other components within normal limits  COMPREHENSIVE METABOLIC PANEL WITH GFR - Abnormal; Notable for the following components:   Potassium 7.0 (*)    CO2 16 (*)    BUN 104 (*)    Creatinine, Ser 6.95 (*)    Calcium  8.3 (*)    Albumin  2.8 (*)    AST 14 (*)    GFR, Estimated 6 (*)    All other components within normal limits  TROPONIN I (HIGH SENSITIVITY) - Abnormal; Notable for the following components:   Troponin I (High Sensitivity) 24 (*)    All other components within normal limits  URINALYSIS, W/ REFLEX TO CULTURE (INFECTION SUSPECTED)  HEPATITIS B SURFACE ANTIGEN  HEPATITIS B SURFACE ANTIBODY, QUANTITATIVE  TROPONIN I (HIGH SENSITIVITY)    EKG: None  Radiology: No results found.   .Critical Care  Performed by: Lang Norleen POUR, PA-C Authorized by: Lang Norleen POUR, PA-C   Critical care provider statement:    Critical care time (minutes):  30   Critical care was necessary to treat or prevent imminent or life-threatening deterioration of the following conditions:  Renal failure   Critical care was time spent personally by me on the following activities:  Development of treatment plan with patient or surrogate, discussions with consultants, evaluation of patient's response to  treatment, examination of patient, ordering and review of laboratory studies, ordering and review of radiographic studies, ordering and performing treatments and interventions, pulse oximetry, re-evaluation of patient's condition and review of old charts    Medications Ordered in the ED  Chlorhexidine  Gluconate Cloth 2 % PADS 6 each (has no administration in time  range)  ipratropium-albuterol  (DUONEB) 0.5-2.5 (3) MG/3ML nebulizer solution 3 mL (3 mLs Nebulization Given 11/29/23 1341)  HYDROcodone -acetaminophen  (NORCO/VICODIN) 5-325 MG per tablet 1 tablet (1 tablet Oral Given 11/29/23 1340)  sodium zirconium cyclosilicate  (LOKELMA ) packet 10 g (10 g Oral Given 11/29/23 1407)  sodium chloride  0.9 % bolus 1,000 mL (1,000 mLs Intravenous New Bag/Given 11/29/23 1514)  insulin  aspart (novoLOG ) injection 5 Units (5 Units Intravenous Given 11/29/23 1510)    And  dextrose  50 % solution 50 mL (50 mLs Intravenous Given 11/29/23 1506)  calcium  gluconate 1 g/ 50 mL sodium chloride  IVPB (0 mg Intravenous Stopped 11/29/23 1538)  furosemide  (LASIX ) injection 20 mg (20 mg Intravenous Given 11/29/23 1512)  sodium bicarbonate  injection 100 mEq (100 mEq Intravenous Given 11/29/23 1502)    Clinical Course as of 11/29/23 1539  Fri Nov 29, 2023  1532 Discussed patient with Dr. Prescilla of nephrology.  Planning CV line placement with IR.  Admitted to hospital service with NP Foust. [JR]    Clinical Course User Index [JR] Latamara Melder K, PA-C                                 Medical Decision Making Amount and/or Complexity of Data Reviewed Labs: ordered. Radiology: ordered.  Risk OTC drugs. Prescription drug management. Decision regarding hospitalization.   Initial Impression and Ddx 83 year old well-appearing female presenting for shortness of breath and abnormal labs.  Exam notable for bruising to the right breast and right sided chest wall tenderness. Patient PMH that increases complexity of ED  encounter:  stage IV CKD, s/p right nephrectomy in 1999, CHF, COPD, right sided rib fractures recent  Interpretation of Diagnostics - I independent reviewed and interpreted the labs as followed: Hyperkalemia (7.0), creatinine 6.95, BUN is 104, bicarb is 16, elevated BNP, leukocytosis but improved from last check, anemia but near baseline  - CT renal stone and chest x-ray are pending  -I personally reviewed interpreted EKG which revealed, QTc 446, sinus rhythm, normal T wave contour  Patient Reassessment and Ultimate Disposition/Management Remains well-appearing, hemodynamically stable and in no acute distress.  Discussed patient with Dr. Prescilla of nephrology who plan to send her to IR for CV line placement for subsequent dialysis.  Of note she does have a white count of almost 16,000 which is improved from yesterday and somewhat tachycardic.  Thus far there is no identifiable source for infection and she looks well, will forego antibiotic treatment but did discuss this with the hospitalist NP Foust who admitted to her service.  For her hyperkalemia, gave Lokelma , Lasix , IV fluids, insulin  with dextrose , albuterol  and calcium .  No notable EKG changes relevant to hyperkalemia.  Patient management required discussion with the following services or consulting groups:  Hospitalist Service and Nephrology  Complexity of Problems Addressed Acute complicated illness or Injury  Additional Data Reviewed and Analyzed Further history obtained from: Past medical history and medications listed in the EMR and Prior ED visit notes  Patient Encounter Risk Assessment Consideration of hospitalization      Final diagnoses:  Renal failure, unspecified chronicity    ED Discharge Orders     None          Lang Norleen POUR, PA-C 11/29/23 1539    Elnor Savant A, DO 11/30/23 1515

## 2023-11-29 NOTE — ED Notes (Signed)
 CCMD called to admit the patient for cardiac monitoring.

## 2023-11-29 NOTE — Assessment & Plan Note (Signed)
 A1c has been less than 5.7 repeatedly. Will not check today.

## 2023-11-30 ENCOUNTER — Inpatient Hospital Stay (HOSPITAL_COMMUNITY)

## 2023-11-30 DIAGNOSIS — N184 Chronic kidney disease, stage 4 (severe): Secondary | ICD-10-CM | POA: Diagnosis not present

## 2023-11-30 DIAGNOSIS — N178 Other acute kidney failure: Secondary | ICD-10-CM | POA: Diagnosis not present

## 2023-11-30 DIAGNOSIS — E039 Hypothyroidism, unspecified: Secondary | ICD-10-CM

## 2023-11-30 DIAGNOSIS — E875 Hyperkalemia: Secondary | ICD-10-CM

## 2023-11-30 DIAGNOSIS — S2241XA Multiple fractures of ribs, right side, initial encounter for closed fracture: Secondary | ICD-10-CM

## 2023-11-30 DIAGNOSIS — R296 Repeated falls: Secondary | ICD-10-CM

## 2023-11-30 DIAGNOSIS — I10 Essential (primary) hypertension: Secondary | ICD-10-CM

## 2023-11-30 DIAGNOSIS — E782 Mixed hyperlipidemia: Secondary | ICD-10-CM

## 2023-11-30 DIAGNOSIS — N185 Chronic kidney disease, stage 5: Secondary | ICD-10-CM | POA: Diagnosis not present

## 2023-11-30 DIAGNOSIS — G2581 Restless legs syndrome: Secondary | ICD-10-CM

## 2023-11-30 DIAGNOSIS — N179 Acute kidney failure, unspecified: Secondary | ICD-10-CM | POA: Diagnosis not present

## 2023-11-30 LAB — RENAL FUNCTION PANEL
Albumin: 2.5 g/dL — ABNORMAL LOW (ref 3.5–5.0)
Anion gap: 14 (ref 5–15)
BUN: 95 mg/dL — ABNORMAL HIGH (ref 8–23)
CO2: 17 mmol/L — ABNORMAL LOW (ref 22–32)
Calcium: 8 mg/dL — ABNORMAL LOW (ref 8.9–10.3)
Chloride: 108 mmol/L (ref 98–111)
Creatinine, Ser: 6.42 mg/dL — ABNORMAL HIGH (ref 0.44–1.00)
GFR, Estimated: 6 mL/min — ABNORMAL LOW (ref >60)
Glucose, Bld: 122 mg/dL — ABNORMAL HIGH (ref 70–99)
Phosphorus: 6.2 mg/dL — ABNORMAL HIGH (ref 2.5–4.6)
Potassium: 5.5 mmol/L — ABNORMAL HIGH (ref 3.5–5.1)
Sodium: 139 mmol/L (ref 135–145)

## 2023-11-30 LAB — URINE CULTURE

## 2023-11-30 LAB — CBC
HCT: 23.8 % — ABNORMAL LOW (ref 36.0–46.0)
Hemoglobin: 7.4 g/dL — ABNORMAL LOW (ref 12.0–15.0)
MCH: 30.8 pg (ref 26.0–34.0)
MCHC: 31.1 g/dL (ref 30.0–36.0)
MCV: 99.2 fL (ref 80.0–100.0)
Platelets: 422 10*3/uL — ABNORMAL HIGH (ref 150–400)
RBC: 2.4 MIL/uL — ABNORMAL LOW (ref 3.87–5.11)
RDW: 16.6 % — ABNORMAL HIGH (ref 11.5–15.5)
WBC: 15.2 10*3/uL — ABNORMAL HIGH (ref 4.0–10.5)
nRBC: 0 % (ref 0.0–0.2)

## 2023-11-30 LAB — IRON AND TIBC
Iron: 83 ug/dL (ref 28–170)
Saturation Ratios: 36 % — ABNORMAL HIGH (ref 10.4–31.8)
TIBC: 230 ug/dL — ABNORMAL LOW (ref 250–450)
UIBC: 147 ug/dL

## 2023-11-30 LAB — FERRITIN: Ferritin: 190 ng/mL (ref 11–307)

## 2023-11-30 MED ORDER — FENTANYL CITRATE PF 50 MCG/ML IJ SOSY
25.0000 ug | PREFILLED_SYRINGE | Freq: Once | INTRAMUSCULAR | Status: AC
  Administered 2023-11-30: 25 ug via INTRAVENOUS
  Filled 2023-11-30: qty 1

## 2023-11-30 MED ORDER — HEPARIN SODIUM (PORCINE) 1000 UNIT/ML DIALYSIS
1000.0000 [IU] | INTRAMUSCULAR | Status: DC | PRN
  Administered 2023-11-30: 2800 [IU] via INTRAVENOUS_CENTRAL
  Filled 2023-11-30: qty 6
  Filled 2023-11-30: qty 4

## 2023-11-30 MED ORDER — HEPARIN SODIUM (PORCINE) 1000 UNIT/ML IJ SOLN
INTRAMUSCULAR | Status: AC
Start: 2023-11-30 — End: 2023-11-30
  Filled 2023-11-30: qty 3

## 2023-11-30 NOTE — Plan of Care (Signed)
  Problem: Education: Goal: Knowledge of General Education information will improve Description: Including pain rating scale, medication(s)/side effects and non-pharmacologic comfort measures Outcome: Progressing   Problem: Health Behavior/Discharge Planning: Goal: Ability to manage health-related needs will improve Outcome: Progressing   Problem: Clinical Measurements: Goal: Ability to maintain clinical measurements within normal limits will improve Outcome: Progressing Goal: Will remain free from infection Outcome: Progressing Goal: Diagnostic test results will improve Outcome: Progressing Goal: Respiratory complications will improve Outcome: Progressing Goal: Cardiovascular complication will be avoided Outcome: Progressing   Problem: Activity: Goal: Risk for activity intolerance will decrease Outcome: Progressing   Problem: Nutrition: Goal: Adequate nutrition will be maintained Outcome: Progressing   Problem: Coping: Goal: Level of anxiety will decrease Outcome: Progressing   Problem: Elimination: Goal: Will not experience complications related to bowel motility Outcome: Progressing Goal: Will not experience complications related to urinary retention Outcome: Progressing   Problem: Pain Managment: Goal: General experience of comfort will improve and/or be controlled Outcome: Progressing   Problem: Safety: Goal: Ability to remain free from injury will improve Outcome: Progressing   Problem: Skin Integrity: Goal: Risk for impaired skin integrity will decrease Outcome: Progressing  0500 pt VSS, pt resting and stable awaiting lab draw this morning.  Voiced no concerns or c/o pain except Rt rib discomfort when repositioned due to rib fractures.

## 2023-11-30 NOTE — Procedures (Signed)
 Central Venous Catheter Insertion Procedure Note  KYM SCANNELL  [id number]  Oct 25, 1940  Date:11/30/23  Time:1:17 PM   Provider Performing:Bishoy Cupp D. Harris   Procedure: Insertion of Non-tunneled Central Venous Catheter(36556)with US  guidance (23062)    Indication(s) Hemodialysis  Consent Risks of the procedure as well as the alternatives and risks of each were explained to the patient and/or caregiver.  Consent for the procedure was obtained and is signed in the bedside chart  Anesthesia Topical only with 1% lidocaine    Timeout Verified patient identification, verified procedure, site/side was marked, verified correct patient position, special equipment/implants available, medications/allergies/relevant history reviewed, required imaging and test results available.  Sterile Technique Maximal sterile technique including full sterile barrier drape, hand hygiene, sterile gown, sterile gloves, mask, hair covering, sterile ultrasound probe cover (if used).  Procedure Description Area of catheter insertion was cleaned with chlorhexidine  and draped in sterile fashion.   With real-time ultrasound guidance a HD catheter was placed into the right internal jugular vein.  Nonpulsatile blood flow and easy flushing noted in all ports.  The catheter was sutured in place and sterile dressing applied.  Complications/Tolerance None; patient tolerated the procedure well. Chest X-ray is ordered to verify placement for internal jugular or subclavian cannulation.  Chest x-ray is not ordered for femoral cannulation.  EBL Minimal  Specimen(s) None  Brittain Hosie D. Harris, NP-C [facility] Personal contact information can be found on Amion  If no contact or response made please call [phone] 11/30/2023, 1:17 PM

## 2023-11-30 NOTE — Progress Notes (Signed)
 2100 Pt received from ED, A&O x4 , pain 5 /10  R Ribs s/p fall on 6/16 at home, Pt incontinent of urine, abnormal labs, will address with medication as ordered, pt in Stach low 100s, placed on Telemetry, pt BP stable, afebrile, pt admitted for possible HD to start,  admitted with AKI on CKD Stage 4 SIRS, metabolic acidosis , hyperkalemia.  22G RT AC PIV saline locked in place.  Voices no other concerns.   2300 Pt required medication to addressa abnormal labs , given pm meds and Lokema 10 mg, RT AC PIV infiltrated, order placed to IV team.  2400 22 G PIV placed, Sodium Bicarb 1 amp given and Calcium  Gluconate 1 GM given.  Admission assessment done.  Pt resting, will monitor.

## 2023-11-30 NOTE — Progress Notes (Signed)
 PROGRESS NOTE    Alexandra [name]  FMW:[id number] DOB: [birth year] DOA: 11/29/2023 PCP: Sherre Clapper, MD   Brief Narrative:  Alexandra [name] is a 83 y.o. 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  She presents after routine evaluation outpatient with notably increased creatinine at 7.09, hospitalist called for admission, nephrology called in consult.  Assessment & Plan:   Principal Problem:   Acute renal failure superimposed on stage 4 chronic kidney disease (HCC) Active Problems:   Essential hypertension   Hyperlipidemia   Chronic obstructive pulmonary disease (HCC)   Recurrent falls   Hyperkalemia   Congestive heart failure (HCC)   Anemia due to chronic kidney disease   Restless leg syndrome   Acquired hypothyroidism   Multiple closed fractures of ribs of right side   AKI on CKD stage IV/V  - Creatinine profoundly elevated from baseline at 7 (baseline around 3.5) - Appreciate nephrology insight and recommendations - Tentative plan for temporary dialysis catheter placement and dialysis, hoping for renal recovery but given patient's longstanding history she may be dialysis dependent moving forward - Renal ultrasound confirms right-sided hydronephrosis and hydroureter with notable cysts - CT stone study notable for enlarging bladder mass  Hyperkalemia -Secondary to above, temporized with calcium  gluconate insulin  D50 and given Lokelma  -Potassium downtrending appropriately -Temp dialysis catheter placement pending, dialysis per nephrology -QRS remains narrow   SIRS criteria Patient meets SIRS criteria with tachycardia, tachypnea and  leukocytosis.  - Initiated on empiric treatment due to prior E faecalis bacteremia presenting without overt symptoms - De-escalate antibiotics pending cultures -currently on cefepime  vancomycin    Recent (R) rib fractures - As needed analgesia - Incentive spirometer/Abdominal binder as needed   COPD not in acute exacerbation - Continue home bronchodilators - PRN nebulizers when patient   HFmrEF - EF in March was 45 to 50% with grade 1 diastolic dysfunction - Not in acute exacerbation  - Hold home torsemide  for now given above - Volume management with dialysis   Hypertension - Continue home hydralazine  and amlodipine  Hyperlipidemia - Continue home atorvastatin  Anemia of chronic disease - Continue home iron Hypothyroidism - Continue home Synthroid  Restless leg syndrome - Continue home Requip     DVT prophylaxis: heparin  injection 5,000 Units Start: 11/29/23 2200   Code Status:   Code Status: Limited: Do not attempt resuscitation (DNR) -DNR-LIMITED -Do Not Intubate/DNI   Family Communication: None present  Status is: Inpatient  Dispo: The patient is from: Home              Anticipated d/c is to: To be determined              Anticipated d/c date is: 72+ hours              Patient currently not medically stable for discharge  Consultants:  Nephrology  Procedures:  Temporary dialysis catheter pending placement today  Antimicrobials:  Cefepime , vancomycin   Subjective: No acute issues or events overnight denies nausea vomiting diarrhea constipation headache fevers chills or chest pain  Objective: Vitals:   11/29/23 2230 11/30/23 0000 11/30/23 0353 11/30/23 0400  BP: (!) 146/61 (!) 141/61 (!) 151/65 (!) 153/68  Pulse: 100 (!) 106 100 (!) 105  Resp: 17 (!) 22  16 18  Temp:  98.1 F (36.7 C) 98.2 F (36.8 C) 98 F (36.7 C)  TempSrc:  Oral Oral Oral  SpO2: 96% 94% 94% 95%  Weight:      Height:        Intake/Output Summary (Last 24 hours) at 11/30/2023 0759 Last  data filed at 11/30/2023 0645 Gross per 24 hour  Intake 50 ml  Output 525 ml  Net -475 ml   Filed Weights   11/29/23 1231 11/29/23 1237 11/29/23 2046  Weight: 63.5 kg 63.5 kg 63 kg    Examination:  General:  Pleasantly resting in bed, No acute distress. HEENT:  Normocephalic atraumatic.  Sclerae nonicteric, noninjected.  Extraocular movements intact bilaterally. Neck:  Without mass or deformity.  Trachea is midline. Lungs:  Clear to auscultate bilaterally without rhonchi, wheeze, or rales. Heart:  Regular rate and rhythm.  Without murmurs, rubs, or gallops. Abdomen:  Soft, nontender, nondistended.  Without guarding or rebound. Extremities: Without cyanosis, clubbing, edema, or obvious deformity. Skin:  Warm and dry, no erythema.  Data Reviewed: I have personally reviewed following labs and imaging studies  CBC: Recent Labs  Lab 11/28/23 0839 11/29/23 1316  WBC 20.8* 15.9*  NEUTROABS 17.7* 13.8*  HGB 8.6* 8.1*  HCT 27.3* 26.8*  MCV 98* 101.9*  PLT 503* 481*   Basic Metabolic Panel: Recent Labs  Lab 11/28/23 0839 11/29/23 1253 11/29/23 1942 11/29/23 1951  NA 137 136 139  --   K CANCELED 7.0* 6.0*  --   CL 105 107 107  --   CO2 10* 16* 16*  --   GLUCOSE 104* 95 141*  --   BUN 96* 104* 97*  --   CREATININE 7.09* 6.95* 6.28*  --   CALCIUM  8.6* 8.3* 8.2*  --   MG  --   --   --  1.4*  PHOS  --   --  5.8*  --    GFR: Estimated Creatinine Clearance: 6.2 mL/min (A) (by C-G formula based on SCr of 6.28 mg/dL (H)). Liver Function Tests: Recent Labs  Lab 11/28/23 0839 11/29/23 1253 11/29/23 1942  AST 13 14*  --   ALT 13 16  --   ALKPHOS 116 71  --   BILITOT 0.2 0.6  --   PROT 6.6 6.5  --   ALBUMIN  3.8 2.8* 2.6*   No results for input(s): LIPASE, AMYLASE in the last 168 hours. No results for input(s): AMMONIA in the last 168 hours. Coagulation Profile: No results for input(s): INR, PROTIME in the last 168 hours. Cardiac Enzymes: No results for  input(s): CKTOTAL, CKMB, CKMBINDEX, TROPONINI in the last 168 hours. BNP (last 3 results) No results for input(s): PROBNP in the last 8760 hours. HbA1C: No results for input(s): HGBA1C in the last 72 hours. CBG: No results for input(s): GLUCAP in the last 168 hours. Lipid Profile: Recent Labs    11/28/23 0839  CHOL 164  HDL 63  LDLCALC 86  TRIG 82  CHOLHDL 2.6   Thyroid  Function Tests: No results for input(s): TSH, T4TOTAL, FREET4, T3FREE, THYROIDAB in the last 72 hours. Anemia Panel: No results for input(s): VITAMINB12, FOLATE, FERRITIN, TIBC, IRON, RETICCTPCT in the last 72 hours. Sepsis Labs: No results for input(s): PROCALCITON, LATICACIDVEN in the last 168 hours.  Recent Results (from the past 240 hours)  Urine Culture     Status: None   Collection Time: 11/28/23  9:59 AM   Specimen: Urine   UR  Result Value Ref  Range Status   Urine Culture, Routine Final report  Final   Organism ID, Bacteria Comment  Final    Comment: Mixed urogenital flora Less than 10,000 colonies/mL   MRSA Next Gen by PCR, Nasal     Status: Abnormal   Collection Time: 11/29/23  5:48 PM   Specimen: Nasal Mucosa; Nasal Swab  Result Value Ref Range Status   MRSA by PCR Next Gen DETECTED (A) NOT DETECTED Final    Comment: RESULT CALLED TO, READ BACK BY AND VERIFIED WITH: RN LOIS DUKE [phone] @ 2235 FH (NOTE) The GeneXpert MRSA Assay (FDA approved for NASAL specimens only), is one component of a comprehensive MRSA colonization surveillance program. It is not intended to diagnose MRSA infection nor to guide or monitor treatment for MRSA infections. Test performance is not FDA approved in patients less than 51 years old. Performed at [facility], [address] [place], [place] [address]          Radiology Studies: US  RENAL Result Date: 11/29/2023 CLINICAL DATA:  High serum creatinine ureteral stent bladder mass on CT EXAM: RENAL / URINARY  TRACT ULTRASOUND COMPLETE COMPARISON:  CT 11/29/2023 in FINDINGS: Right Kidney: Renal measurements: 11.5 x 6.3 x 5.4 cm = volume: 218 mL. Several renal cysts again demonstrated measuring 3.5 and 1.9 cm. Moderate hydronephrosis and proximal hydroureter. LEFT kidney Absent Bladder: Poorly evaluated IMPRESSION: 1. Moderate right hydronephrosis and hydroureter. 2. Right renal cysts. 3. Bladder poorly evaluated. Electronically Signed   By: Jackquline Boxer M.D.   On: 11/29/2023 19:40   CT Renal Stone Study Result Date: 11/29/2023 CLINICAL DATA:  Acute renal failure superimposed on stage 5 chronic kidney disease. Previous left nephrectomy for renal cell carcinoma. Known bladder cancer. Abdomen and flank pain. EXAM: CT ABDOMEN AND PELVIS WITHOUT CONTRAST TECHNIQUE: Multidetector CT imaging of the abdomen and pelvis was performed following the standard protocol without IV contrast. RADIATION DOSE REDUCTION: This exam was performed according to the departmental dose-optimization program which includes automated exposure control, adjustment of the mA and/or kV according to patient size and/or use of iterative reconstruction technique. COMPARISON:  08/03/2022, 12/18/2022, 07/20/2022, 02/15/2022 and 01/26/2021. Pelvis CT dated 11/25/2023. FINDINGS: Lower chest: Stable mildly enlarged heart. Mild bibasilar scarring/atelectasis without significant change. Hepatobiliary: No focal liver abnormality is seen. Status post cholecystectomy. No biliary dilatation. Pancreas: Moderate diffuse pancreatic atrophy. Spleen: Normal in size without focal abnormality. Adrenals/Urinary Tract: Stable right renal simple and hyperdense cysts. Moderate to marked right hydronephrosis and hydroureter to the level of the urinary bladder with a stable right ureteral stent. 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 This is not changed since multiple previous examinations dating back to 01/26/2021. Normal-appearing  right adrenal gland. Small amount of urine in the urinary bladder. Slowly enlarging bladder mass which initially started on the right and now extends across to the left with a ureteral stent extending through the mass into the bladder. This mass measures 5.9 x 3.2 cm on coronal image number 54/6 and 4.0 cm in AP diameter on axial image number 62/3. This corresponds to the patient's known bladder cancer. Stomach/Bowel: Multiple diverticula throughout the colon, most numerous in the sigmoid region. No evidence of diverticulitis. The appendix is not visualized. No secondary signs of appendicitis. Unremarkable stomach and small bowel. Vascular/Lymphatic: Atheromatous arterial calcifications without aneurysm. No enlarged lymph nodes. Reproductive: Status post hysterectomy. No adnexal masses. Other: Very small umbilical and supraumbilical ventral hernia containing fat. Musculoskeletal: Stable lumbar spine postoperative  and degenerative changes. Stable 10% L1 superior endplate compression deformity with no acute fracture lines and minimal bony retropulsion. IMPRESSION: 1. Slowly enlarging bladder mass, as described above, corresponding to the patient's known bladder cancer. 2. Moderate to marked right hydronephrosis and hydroureter to the level of the urinary bladder with mild progression and a stable right ureteral stent. This remains concerning for stent failure associated with the enlarging bladder mass. 3. 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 4. Colonic diverticulosis. 5. Stable mild cardiomegaly. Electronically Signed   By: Elspeth Bathe M.D.   On: 11/29/2023 18:04   DG Chest 2 View Result Date: 11/29/2023 CLINICAL DATA:  Chest pain. EXAM: CHEST - 2 VIEW COMPARISON:  August 03, 2022 FINDINGS: There is been interval removal of the right-sided venous Port-A-Cath seen on the prior study. The cardiac silhouette is mildly enlarged and unchanged in size. There is marked  severity calcification of the thoracic aorta. No acute infiltrate, pleural effusion or pneumothorax is identified. A chronic appearing deformity is seen involving the distal right upper. Degenerative changes seen throughout the thoracic spine. IMPRESSION: 1. Interval removal of the right-sided venous Port-A-Cath. 2. Stable cardiomegaly without acute cardiopulmonary disease. Electronically Signed   By: Suzen Dials M.D.   On: 11/29/2023 16:44        Scheduled Meds:  amLODipine   10 mg Oral q AM   atorvastatin   40 mg Oral QHS   Chlorhexidine  Gluconate Cloth  6 each Topical Q0600   darbepoetin (ARANESP ) injection - DIALYSIS  150 mcg Subcutaneous Q Sat   ferrous sulfate   325 mg Oral q AM   heparin   5,000 Units Subcutaneous Q8H   hydrALAZINE   50 mg Oral TID   levothyroxine   100 mcg Oral Q0600   lidocaine   1 patch Transdermal Q24H   pantoprazole   40 mg Oral Daily   rOPINIRole   2 mg Oral BID   vancomycin  variable dose per unstable renal function (pharmacist dosing)   Does not apply See admin instructions   Continuous Infusions:  ceFEPime  (MAXIPIME ) IV       LOS: 1 day    Time spent:    Elsie JAYSON Montclair, DO [facility]  If 7PM-7AM, please contact night-coverage [email or web address]  11/30/2023, 7:59 AM

## 2023-11-30 NOTE — Progress Notes (Signed)
 Subjective:  unfortunately was not able to get temp HD cath yesterday-  was treated medically for K and down to 5.5 this AM.  Currently in discussions to get line and dialysis done today-  pt feels about the same -  I also note that her scans show an increasing bladder mass and hydronephrosis   Objective Vital signs in last 24 hours: Vitals:   11/30/23 0000 11/30/23 0353 11/30/23 0400 11/30/23 0800  BP: (!) 141/61 (!) 151/65 (!) 153/68 130/61  Pulse: (!) 106 100 (!) 105 100  Resp: (!) 22 16 18 20   Temp: 98.1 F (36.7 C) 98.2 F (36.8 C) 98 F (36.7 C) 98.2 F (36.8 C)  TempSrc: Oral Oral Oral Oral  SpO2: 94% 94% 95% 92%  Weight:      Height:       Weight change:   Intake/Output Summary (Last 24 hours) at 11/30/2023 1203 Last data filed at 11/30/2023 0645 Gross per 24 hour  Intake 50 ml  Output 525 ml  Net -475 ml     Assessment/Plan: 83 year old WF with diastolic heart failure and 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   I figured it was time  even though she is 83 she is not extremely debilitated.  Understands at least the time commitment and some issues related to incenter HD.  She did not really consider not doing it.  Given K of 7 and most likely uremic sxms , I think it may be best to go ahead and start dialysis-  she is agreeable- line now placed and she is awaiting first HD-   check urinalysis and kidney imaging to make sure no issue there that might be reversible.  Now see that she has hydro - doesn't appear to be new however ?  She is followed by [facility] in [place]-  gets stent changed-  also with this enlarging bladder mass which does appear to be new-  talked to [facility]-  they felt the appearance of hydro goes along with stent being in place-  they recommended placing foley-  unclear if will see any  recovery at this point however.  They did not recommend doing anything acutely for bladder mass 2. Hypertension/volume  - BP is highish and she is volume overloaded-  UF as able with HD-  also continue her home BP meds  3. Hyperkalemia-  got short term tx this afternoon-  calcium /insulin /bicarb and lokelma -  plan is to do dialysis once catheter is placed-   4. Anemia  - hgb in the 8's. Will check iron  ( was OK)  and add ESA -   hold in the setting of inc bladder mass     Curtis DELENA Heman    Labs: Basic Metabolic Panel: Recent Labs  Lab 11/29/23 1253 11/29/23 1942 11/30/23 0755  NA 136 139 139  K 7.0* 6.0* 5.5*  CL 107 107 108  CO2 16* 16* 17*  GLUCOSE 95 141* 122*  BUN 104* 97* 95*  CREATININE 6.95* 6.28* 6.42*  CALCIUM  8.3* 8.2* 8.0*  PHOS  --  5.8* 6.2*   Liver Function Tests: Recent Labs  Lab 11/28/23 0839 11/29/23 1253 11/29/23 1942 11/30/23 0755  AST 13 14*  --   --   ALT 13 16  --   --  ALKPHOS 116 71  --   --   BILITOT 0.2 0.6  --   --   PROT 6.6 6.5  --   --   ALBUMIN  3.8 2.8* 2.6* 2.5*   No results for input(s): LIPASE, AMYLASE in the last 168 hours. No results for input(s): AMMONIA in the last 168 hours. CBC: Recent Labs  Lab 11/28/23 0839 11/29/23 1316 11/30/23 0754  WBC 20.8* 15.9* 15.2*  NEUTROABS 17.7* 13.8*  --   HGB 8.6* 8.1* 7.4*  HCT 27.3* 26.8* 23.8*  MCV 98* 101.9* 99.2  PLT 503* 481* 422*   Cardiac Enzymes: No results for input(s): CKTOTAL, CKMB, CKMBINDEX, TROPONINI in the last 168 hours. CBG: No results for input(s): GLUCAP in the last 168 hours.  Iron Studies:  Recent Labs    11/30/23 0754  IRON 83  TIBC 230*  FERRITIN 190   Studies/Results: US  RENAL Result Date: 11/29/2023 CLINICAL DATA:  High serum creatinine ureteral stent bladder mass on CT EXAM: RENAL / URINARY TRACT ULTRASOUND COMPLETE COMPARISON:  CT 11/29/2023 in FINDINGS: Right Kidney: Renal measurements: 11.5 x 6.3 x 5.4 cm = volume: 218  mL. Several renal cysts again demonstrated measuring 3.5 and 1.9 cm. Moderate hydronephrosis and proximal hydroureter. LEFT kidney Absent Bladder: Poorly evaluated IMPRESSION: 1. Moderate right hydronephrosis and hydroureter. 2. Right renal cysts. 3. Bladder poorly evaluated. Electronically Signed   By: Jackquline Boxer M.D.   On: 11/29/2023 19:40   CT Renal Stone Study Result Date: 11/29/2023 CLINICAL DATA:  Acute renal failure superimposed on stage 5 chronic kidney disease. Previous left nephrectomy for renal cell carcinoma. Known bladder cancer. Abdomen and flank pain. EXAM: CT ABDOMEN AND PELVIS WITHOUT CONTRAST TECHNIQUE: Multidetector CT imaging of the abdomen and pelvis was performed following the standard protocol without IV contrast. RADIATION DOSE REDUCTION: This exam was performed according to the departmental dose-optimization program which includes automated exposure control, adjustment of the mA and/or kV according to patient size and/or use of iterative reconstruction technique. COMPARISON:  08/03/2022, 12/18/2022, 07/20/2022, 02/15/2022 and 01/26/2021. Pelvis CT dated 11/25/2023. FINDINGS: Lower chest: Stable mildly enlarged heart. Mild bibasilar scarring/atelectasis without significant change. Hepatobiliary: No focal liver abnormality is seen. Status post cholecystectomy. No biliary dilatation. Pancreas: Moderate diffuse pancreatic atrophy. Spleen: Normal in size without focal abnormality. Adrenals/Urinary Tract: Stable right renal simple and hyperdense cysts. Moderate to marked right hydronephrosis and hydroureter to the level of the urinary bladder with a stable right ureteral stent. 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 This is not changed since multiple previous examinations dating back to 01/26/2021. Normal-appearing right adrenal gland. Small amount of urine in the urinary bladder. Slowly enlarging bladder mass which initially started on the right  and now extends across to the left with a ureteral stent extending through the mass into the bladder. This mass measures 5.9 x 3.2 cm on coronal image number 54/6 and 4.0 cm in AP diameter on axial image number 62/3. This corresponds to the patient's known bladder cancer. Stomach/Bowel: Multiple diverticula throughout the colon, most numerous in the sigmoid region. No evidence of diverticulitis. The appendix is not visualized. No secondary signs of appendicitis. Unremarkable stomach and small bowel. Vascular/Lymphatic: Atheromatous arterial calcifications without aneurysm. No enlarged lymph nodes. Reproductive: Status post hysterectomy. No adnexal masses. Other: Very small umbilical and supraumbilical ventral hernia containing fat. Musculoskeletal: Stable lumbar spine postoperative and degenerative changes. Stable 10% L1 superior endplate compression deformity with no acute fracture lines and minimal bony retropulsion. IMPRESSION:  1. Slowly enlarging bladder mass, as described above, corresponding to the patient's known bladder cancer. 2. Moderate to marked right hydronephrosis and hydroureter to the level of the urinary bladder with mild progression and a stable right ureteral stent. This remains concerning for stent failure associated with the enlarging bladder mass. 3. 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 4. Colonic diverticulosis. 5. Stable mild cardiomegaly. Electronically Signed   By: Elspeth Bathe M.D.   On: 11/29/2023 18:04   DG Chest 2 View Result Date: 11/29/2023 CLINICAL DATA:  Chest pain. EXAM: CHEST - 2 VIEW COMPARISON:  August 03, 2022 FINDINGS: There is been interval removal of the right-sided venous Port-A-Cath seen on the prior study. The cardiac silhouette is mildly enlarged and unchanged in size. There is marked severity calcification of the thoracic aorta. No acute infiltrate, pleural effusion or pneumothorax is identified. A chronic appearing  deformity is seen involving the distal right upper. Degenerative changes seen throughout the thoracic spine. IMPRESSION: 1. Interval removal of the right-sided venous Port-A-Cath. 2. Stable cardiomegaly without acute cardiopulmonary disease. Electronically Signed   By: Suzen Dials M.D.   On: 11/29/2023 16:44   Medications: Infusions:  ceFEPime  (MAXIPIME ) IV      Scheduled Medications:  amLODipine   10 mg Oral q AM   atorvastatin   40 mg Oral QHS   Chlorhexidine  Gluconate Cloth  6 each Topical Q0600   darbepoetin (ARANESP ) injection - DIALYSIS  150 mcg Subcutaneous Q Sat   ferrous sulfate   325 mg Oral q AM   heparin   5,000 Units Subcutaneous Q8H   hydrALAZINE   50 mg Oral TID   levothyroxine   100 mcg Oral Q0600   lidocaine   1 patch Transdermal Q24H   pantoprazole   40 mg Oral Daily   rOPINIRole   2 mg Oral BID   vancomycin  variable dose per unstable renal function (pharmacist dosing)   Does not apply See admin instructions    have reviewed scheduled and prn medications.  Physical Exam: General: alert, NAD Heart:RRR Lungs: mostly clear-  rib fracture pain  Abdomen: soft, non tender Extremities: 1+ edema  Dialysis Access: new right sided temp cath     11/30/2023,12:03 PM  LOS: 1 day

## 2023-11-30 NOTE — Progress Notes (Signed)
 Pt tolerated first Hemodialysis treatment well. 1L of fluid removed without difficutly. No verbalized concerns post tx.

## 2023-12-01 DIAGNOSIS — N184 Chronic kidney disease, stage 4 (severe): Secondary | ICD-10-CM

## 2023-12-01 DIAGNOSIS — D631 Anemia in chronic kidney disease: Secondary | ICD-10-CM

## 2023-12-01 DIAGNOSIS — J410 Simple chronic bronchitis: Secondary | ICD-10-CM

## 2023-12-01 DIAGNOSIS — N185 Chronic kidney disease, stage 5: Secondary | ICD-10-CM | POA: Diagnosis not present

## 2023-12-01 DIAGNOSIS — N178 Other acute kidney failure: Secondary | ICD-10-CM

## 2023-12-01 DIAGNOSIS — I5032 Chronic diastolic (congestive) heart failure: Secondary | ICD-10-CM | POA: Diagnosis not present

## 2023-12-01 LAB — BASIC METABOLIC PANEL WITH GFR
Anion gap: 11 (ref 5–15)
BUN: 49 mg/dL — ABNORMAL HIGH (ref 8–23)
CO2: 23 mmol/L (ref 22–32)
Calcium: 7.7 mg/dL — ABNORMAL LOW (ref 8.9–10.3)
Chloride: 103 mmol/L (ref 98–111)
Creatinine, Ser: 3.9 mg/dL — ABNORMAL HIGH (ref 0.44–1.00)
GFR, Estimated: 11 mL/min — ABNORMAL LOW (ref >60)
Glucose, Bld: 127 mg/dL — ABNORMAL HIGH (ref 70–99)
Potassium: 4.4 mmol/L (ref 3.5–5.1)
Sodium: 137 mmol/L (ref 135–145)

## 2023-12-01 LAB — VANCOMYCIN, RANDOM: Vancomycin Rm: 14 ug/mL

## 2023-12-01 LAB — PTH, INTACT AND CALCIUM
Calcium, Total (PTH): 8 mg/dL — ABNORMAL LOW (ref 8.7–10.3)
PTH: 72 pg/mL — ABNORMAL HIGH (ref 15–65)

## 2023-12-01 MED ORDER — CHLORHEXIDINE GLUCONATE CLOTH 2 % EX PADS
6.0000 | MEDICATED_PAD | Freq: Every day | CUTANEOUS | Status: DC
Start: 2023-12-02 — End: 2023-12-05
  Administered 2023-12-02 – 2023-12-05 (×4): 6 via TOPICAL

## 2023-12-01 MED ORDER — VANCOMYCIN HCL 750 MG/150ML IV SOLN
750.0000 mg | Freq: Once | INTRAVENOUS | Status: AC
  Administered 2023-12-01: 750 mg via INTRAVENOUS
  Filled 2023-12-01: qty 150

## 2023-12-01 MED ORDER — AMOXICILLIN 500 MG PO CAPS
500.0000 mg | ORAL_CAPSULE | ORAL | Status: DC
  Administered 2023-12-01 – 2023-12-04 (×4): 500 mg via ORAL
  Filled 2023-12-01 (×4): qty 1

## 2023-12-01 NOTE — Progress Notes (Signed)
 PROGRESS NOTE        PATIENT DETAILS Name: Alexandra [name] Age: 83 y.o. Sex: female Date of Birth: 1940-07-21 Admit Date: 11/29/2023 Admitting Physician Lebron JINNY Cage, MD PCP:Cox, Abigail, MD  Brief Summary: Patient is a 83 y.o.  female with history of CKD 4, HTN, chronic right-sided hydronephrosis-s/p stent (changed every 3 months), renal cell carcinoma-s/p left nephrectomy 1999-sent to the ED for abnormal labs with a creatinine of 7.09.  Significant events: 6/20>> admit to [facility]  Significant studies: 6/20> CXR: No pneumonia 6/20>> CT renal stone study: Slowly enlarging bladder mass-known bladder cancer-moderate to marked right hydronephrosis/hydroureter to the level of the bladder. 6/20>> renal ultrasound: Moderate right hydronephrosis/right hydroureter.  Significant microbiology data: 6/20>> blood culture: No growth  Procedures: 6/21>> nontunneled dialysis catheter by PCCM.  Consults: Nephrology  Subjective: Lying comfortably in bed-denies any chest pain or shortness of breath.  Objective: Vitals: Blood pressure (!) 114/53, pulse 92, temperature 98 F (36.7 C), temperature source Oral, resp. rate 15, height 5' 2 (1.575 m), weight 65.8 kg, SpO2 90%.   Exam: Gen Exam:Alert awake-not in any distress HEENT:atraumatic, normocephalic Chest: B/L clear to auscultation anteriorly CVS:S1S2 regular Abdomen:soft non tender, non distended Extremities:no edema Neurology: Non focal Skin: no rash  Pertinent Labs/Radiology:    Latest Ref Rng & Units 11/30/2023    7:54 AM 11/29/2023    1:16 PM 11/28/2023    8:39 AM  CBC  WBC 4.0 - 10.5 K/uL 15.2  15.9  20.8   Hemoglobin 12.0 - 15.0 g/dL 7.4  8.1  8.6   Hematocrit 36.0 - 46.0 % 23.8  26.8  27.3   Platelets 150 - 400 K/uL 422  481  503     Lab Results  Component Value Date   NA 137 12/01/2023   K 4.4 12/01/2023   CL 103 12/01/2023   CO2 23 12/01/2023      Assessment/Plan: AKI on CKD  4 Unclear if this is AKI or progression of underlying CKD Evaluated by nephrology-underwent placement of temporary dialysis catheter by PCCM on 6/21 and subsequently started on HD. Avoid nephrotoxic agents-and watch for signs of renal recovery Nephrology continues to follow.  Hyperkalemia Secondary to worsening renal function. Resolved after HD.  SIRS secondary to complicated UTI Recent outpatient urine culture positive for Enterococcus Given issues with hydronephrosis/stent/bladder cancer-will plan to treat for 10 to 14 days-but narrow antibiotics to amoxicillin .  Normocytic anemia Likely due to combination of ESRD/acute illness.  History of chronic right-sided hydronephrosis-s/p stent (changed every 3 months) Follows with Dr. Chao-urology in [place]  History of bladder cancer Follows with urology-per patient-this is being observed  History of renal cell carcinoma-s/p left nephrectomy in 1999  COPD Not in exacerbation Bronchodilators  Recent right-sided rib fracture Supportive care Incentive spirometry/flutter valve  Chronic HFmrEF Euvolemic Volume removal with HD  HTN Stable Continue amlodipine   HLD Statin  GERD PPI  Restless leg syndrome Requip   Hypothyroidism Synthroid   Code status:   Code Status: Limited: Do not attempt resuscitation (DNR) -DNR-LIMITED -Do Not Intubate/DNI    DVT Prophylaxis: heparin  injection 5,000 Units Start: 11/29/23 2200   Family Communication: None at bedside   Disposition Plan: Status is: Inpatient Remains inpatient appropriate because: Severity of illness   Planned Discharge Destination:Home   Diet: Diet Order  Diet renal with fluid restriction Fluid restriction: 1500 mL Fluid; Room service appropriate? Yes; Fluid consistency: Thin  Diet effective now                     Antimicrobial agents: Anti-infectives (From admission, onward)    Start     Dose/Rate Route Frequency Ordered Stop    12/01/23 1145  vancomycin  (VANCOREADY) IVPB 750 mg/150 mL        750 mg 150 mL/hr over 60 Minutes Intravenous  Once 12/01/23 1055     11/30/23 1800  ceFEPIme  (MAXIPIME ) 1 g in sodium chloride  0.9 % 100 mL IVPB        1 g 200 mL/hr over 30 Minutes Intravenous Every 24 hours 11/29/23 1756     11/29/23 1800  vancomycin  (VANCOREADY) IVPB 1250 mg/250 mL        1,250 mg 166.7 mL/hr over 90 Minutes Intravenous  Once 11/29/23 1756 11/29/23 2233   11/29/23 1800  ceFEPIme  (MAXIPIME ) 2 g in sodium chloride  0.9 % 100 mL IVPB        2 g 200 mL/hr over 30 Minutes Intravenous  Once 11/29/23 1756 11/29/23 1847   11/29/23 1755  vancomycin  variable dose per unstable renal function (pharmacist dosing)         Does not apply See admin instructions 11/29/23 1756          MEDICATIONS: Scheduled Meds:  amLODipine   10 mg Oral q AM   atorvastatin   40 mg Oral QHS   Chlorhexidine  Gluconate Cloth  6 each Topical Q0600   ferrous sulfate   325 mg Oral q AM   heparin   5,000 Units Subcutaneous Q8H   hydrALAZINE   50 mg Oral TID   levothyroxine   100 mcg Oral Q0600   lidocaine   1 patch Transdermal Q24H   pantoprazole   40 mg Oral Daily   rOPINIRole   2 mg Oral BID   vancomycin  variable dose per unstable renal function (pharmacist dosing)   Does not apply See admin instructions   Continuous Infusions:  ceFEPime  (MAXIPIME ) IV 1 g (11/30/23 1846)   vancomycin  750 mg (12/01/23 1147)   PRN Meds:.acetaminophen  **OR** acetaminophen , heparin , HYDROcodone -acetaminophen , ipratropium-albuterol , melatonin, ondansetron  **OR** ondansetron  (ZOFRAN ) IV   I have personally reviewed following labs and imaging studies  LABORATORY DATA: CBC: Recent Labs  Lab 11/28/23 0839 11/29/23 1316 11/30/23 0754  WBC 20.8* 15.9* 15.2*  NEUTROABS 17.7* 13.8*  --   HGB 8.6* 8.1* 7.4*  HCT 27.3* 26.8* 23.8*  MCV 98* 101.9* 99.2  PLT 503* 481* 422*    Basic Metabolic Panel: Recent Labs  Lab 11/28/23 0839 11/29/23 1253  11/29/23 1942 11/29/23 1951 11/30/23 0755 12/01/23 0639  NA 137 136 139  --  139 137  K CANCELED 7.0* 6.0*  --  5.5* 4.4  CL 105 107 107  --  108 103  CO2 10* 16* 16*  --  17* 23  GLUCOSE 104* 95 141*  --  122* 127*  BUN 96* 104* 97*  --  95* 49*  CREATININE 7.09* 6.95* 6.28*  --  6.42* 3.90*  CALCIUM  8.6* 8.3* 8.2*  --  8.0* 7.7*  MG  --   --   --  1.4*  --   --   PHOS  --   --  5.8*  --  6.2*  --     GFR: Estimated Creatinine Clearance: 10.2 mL/min (A) (by C-G formula based on SCr of 3.9 mg/dL (H)).  Liver Function Tests: Recent  Labs  Lab 11/28/23 0839 11/29/23 1253 11/29/23 1942 11/30/23 0755  AST 13 14*  --   --   ALT 13 16  --   --   ALKPHOS 116 71  --   --   BILITOT 0.2 0.6  --   --   PROT 6.6 6.5  --   --   ALBUMIN  3.8 2.8* 2.6* 2.5*   No results for input(s): LIPASE, AMYLASE in the last 168 hours. No results for input(s): AMMONIA in the last 168 hours.  Coagulation Profile: No results for input(s): INR, PROTIME in the last 168 hours.  Cardiac Enzymes: No results for input(s): CKTOTAL, CKMB, CKMBINDEX, TROPONINI in the last 168 hours.  BNP (last 3 results) No results for input(s): PROBNP in the last 8760 hours.  Lipid Profile: No results for input(s): CHOL, HDL, LDLCALC, TRIG, CHOLHDL, LDLDIRECT in the last 72 hours.  Thyroid  Function Tests: No results for input(s): TSH, T4TOTAL, FREET4, T3FREE, THYROIDAB in the last 72 hours.  Anemia Panel: Recent Labs    11/30/23 0754  FERRITIN 190  TIBC 230*  IRON 83    Urine analysis:    Component Value Date/Time   COLORURINE YELLOW 08/04/2022 0257   APPEARANCEUR HAZY (A) 08/04/2022 0257   LABSPEC 1.010 08/04/2022 0257   PHURINE 5.0 08/04/2022 0257   GLUCOSEU NEGATIVE 08/04/2022 0257   HGBUR MODERATE (A) 08/04/2022 0257   BILIRUBINUR negative 11/28/2023 0923   KETONESUR negative 11/28/2023 0923   KETONESUR NEGATIVE 08/04/2022 0257   PROTEINUR 100 (A)  08/04/2022 0257   UROBILINOGEN 0.2 11/28/2023 0923   NITRITE Negative 11/28/2023 0923   NITRITE NEGATIVE 08/04/2022 0257   LEUKOCYTESUR Large (3+) (A) 11/28/2023 0923   LEUKOCYTESUR LARGE (A) 08/04/2022 0257    Sepsis Labs: Lactic Acid, Venous    Component Value Date/Time   LATICACIDVEN 0.6 08/04/2022 0940    MICROBIOLOGY: Recent Results (from the past 240 hours)  Urine Culture     Status: None   Collection Time: 11/28/23  9:59 AM   Specimen: Urine   UR  Result Value Ref Range Status   Urine Culture, Routine Final report  Final   Organism ID, Bacteria Comment  Final    Comment: Mixed urogenital flora Less than 10,000 colonies/mL   MRSA Next Gen by PCR, Nasal     Status: Abnormal   Collection Time: 11/29/23  5:48 PM   Specimen: Nasal Mucosa; Nasal Swab  Result Value Ref Range Status   MRSA by PCR Next Gen DETECTED (A) NOT DETECTED Final    Comment: RESULT CALLED TO, READ BACK BY AND VERIFIED WITH: RN LOIS DUKE [phone] @ 2235 FH (NOTE) The GeneXpert MRSA Assay (FDA approved for NASAL specimens only), is one component of a comprehensive MRSA colonization surveillance program. It is not intended to diagnose MRSA infection nor to guide or monitor treatment for MRSA infections. Test performance is not FDA approved in patients less than 66 years old. Performed at [facility], [address], [place], [place] [address]   Culture, blood (Routine X 2) w Reflex to ID Panel     Status: None (Preliminary result)   Collection Time: 11/29/23  7:42 PM   Specimen: BLOOD RIGHT HAND  Result Value Ref Range Status   Specimen Description BLOOD RIGHT HAND  Final   Special Requests   Final    BOTTLES DRAWN AEROBIC ONLY Blood Culture adequate volume   Culture   Final    NO GROWTH 2 DAYS Performed at  [facility], [address] [place], [place] [address]    Report Status PENDING  Incomplete  Culture, blood (Routine X 2) w Reflex to ID Panel     Status: None (Preliminary  result)   Collection Time: 11/29/23  9:24 PM   Specimen: BLOOD RIGHT HAND  Result Value Ref Range Status   Specimen Description BLOOD RIGHT HAND  Final   Special Requests   Final    BOTTLES DRAWN AEROBIC AND ANAEROBIC Blood Culture results may not be optimal due to an inadequate volume of blood received in culture bottles   Culture   Final    NO GROWTH 2 DAYS Performed at [facility], [address] [place], [place] [address]    Report Status PENDING  Incomplete    RADIOLOGY STUDIES/RESULTS: DG CHEST PORT 1 VIEW Result Date: 11/30/2023 CLINICAL DATA:  Status post central line placement EXAM: PORTABLE CHEST 1 VIEW COMPARISON:  Chest radiograph dated 11/29/2023 FINDINGS: Lines/tubes: Right internal jugular venous catheter tip projects over the superior cavoatrial junction. Lungs: Low lung volumes with bronchovascular crowding. No focal consolidation. Pleura: Blunting of bilateral costophrenic angles.  No pneumothorax. Heart/mediastinum: The heart size and mediastinal contours are within normal limits. Bones: No acute osseous abnormality. Scattered right upper quadrant and midline epigastric surgical clips. IMPRESSION: 1. Right internal jugular venous catheter tip projects over the superior cavoatrial junction. No pneumothorax. 2. Low lung volumes with bronchovascular crowding. 3. Blunting of bilateral costophrenic angles, which may represent trace pleural effusions. Electronically Signed   By: Limin  Xu M.D.   On: 11/30/2023 14:03   US  RENAL Result Date: 11/29/2023 CLINICAL DATA:  High serum creatinine ureteral stent bladder mass on CT EXAM: RENAL / URINARY TRACT ULTRASOUND COMPLETE COMPARISON:  CT 11/29/2023 in FINDINGS: Right Kidney: Renal measurements: 11.5 x 6.3 x 5.4 cm = volume: 218 mL. Several renal cysts again demonstrated measuring 3.5 and 1.9 cm. Moderate hydronephrosis and proximal hydroureter. LEFT kidney Absent Bladder: Poorly evaluated IMPRESSION: 1. Moderate right  hydronephrosis and hydroureter. 2. Right renal cysts. 3. Bladder poorly evaluated. Electronically Signed   By: Jackquline Boxer M.D.   On: 11/29/2023 19:40   CT Renal Stone Study Result Date: 11/29/2023 CLINICAL DATA:  Acute renal failure superimposed on stage 5 chronic kidney disease. Previous left nephrectomy for renal cell carcinoma. Known bladder cancer. Abdomen and flank pain. EXAM: CT ABDOMEN AND PELVIS WITHOUT CONTRAST TECHNIQUE: Multidetector CT imaging of the abdomen and pelvis was performed following the standard protocol without IV contrast. RADIATION DOSE REDUCTION: This exam was performed according to the departmental dose-optimization program which includes automated exposure control, adjustment of the mA and/or kV according to patient size and/or use of iterative reconstruction technique. COMPARISON:  08/03/2022, 12/18/2022, 07/20/2022, 02/15/2022 and 01/26/2021. Pelvis CT dated 11/25/2023. FINDINGS: Lower chest: Stable mildly enlarged heart. Mild bibasilar scarring/atelectasis without significant change. Hepatobiliary: No focal liver abnormality is seen. Status post cholecystectomy. No biliary dilatation. Pancreas: Moderate diffuse pancreatic atrophy. Spleen: Normal in size without focal abnormality. Adrenals/Urinary Tract: Stable right renal simple and hyperdense cysts. Moderate to marked right hydronephrosis and hydroureter to the level of the urinary bladder with a stable right ureteral stent. 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 This is not changed since multiple previous examinations dating back to 01/26/2021. Normal-appearing right adrenal gland. Small amount of urine in the urinary bladder. Slowly enlarging bladder mass which initially started on the right and now extends across to the left with a  ureteral stent extending through the mass into the bladder. This mass measures 5.9 x 3.2 cm on coronal image number 54/6 and 4.0 cm in AP diameter on  axial image number 62/3. This corresponds to the patient's known bladder cancer. Stomach/Bowel: Multiple diverticula throughout the colon, most numerous in the sigmoid region. No evidence of diverticulitis. The appendix is not visualized. No secondary signs of appendicitis. Unremarkable stomach and small bowel. Vascular/Lymphatic: Atheromatous arterial calcifications without aneurysm. No enlarged lymph nodes. Reproductive: Status post hysterectomy. No adnexal masses. Other: Very small umbilical and supraumbilical ventral hernia containing fat. Musculoskeletal: Stable lumbar spine postoperative and degenerative changes. Stable 10% L1 superior endplate compression deformity with no acute fracture lines and minimal bony retropulsion. IMPRESSION: 1. Slowly enlarging bladder mass, as described above, corresponding to the patient's known bladder cancer. 2. Moderate to marked right hydronephrosis and hydroureter to the level of the urinary bladder with mild progression and a stable right ureteral stent. This remains concerning for stent failure associated with the enlarging bladder mass. 3. 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 4. Colonic diverticulosis. 5. Stable mild cardiomegaly. Electronically Signed   By: Elspeth Bathe M.D.   On: 11/29/2023 18:04   DG Chest 2 View Result Date: 11/29/2023 CLINICAL DATA:  Chest pain. EXAM: CHEST - 2 VIEW COMPARISON:  August 03, 2022 FINDINGS: There is been interval removal of the right-sided venous Port-A-Cath seen on the prior study. The cardiac silhouette is mildly enlarged and unchanged in size. There is marked severity calcification of the thoracic aorta. No acute infiltrate, pleural effusion or pneumothorax is identified. A chronic appearing deformity is seen involving the distal right upper. Degenerative changes seen throughout the thoracic spine. IMPRESSION: 1. Interval removal of the right-sided venous Port-A-Cath. 2. Stable  cardiomegaly without acute cardiopulmonary disease. Electronically Signed   By: Suzen Dials M.D.   On: 11/29/2023 16:44     LOS: 2 days   Donalda Applebaum, MD  [facility]    To contact the attending provider between 7A-7P or the covering provider during after hours 7P-7A, please log into the web site [email or web address] and access using universal [place] password for that web site. If you do not have the password, please call the hospital operator.  12/01/2023, 12:45 PM

## 2023-12-01 NOTE — Progress Notes (Signed)
 Subjective:  s/p temp HD cath  and HD yesterday-  tolerated well-  son at bedside today with appropriate questions   Objective Vital signs in last 24 hours: Vitals:   11/30/23 2140 11/30/23 2314 12/01/23 0403 12/01/23 0800  BP: (!) 141/64 (!) 140/68 (!) 126/54 (!) 114/53  Pulse: 94 95 (!) 104 92  Resp: 19 17 18 15   Temp:  98.1 F (36.7 C) 97.7 F (36.5 C) 98 F (36.7 C)  TempSrc:  Oral Oral Oral  SpO2: 96% 95% 92% 90%  Weight:      Height:       Weight change: 2.696 kg  Intake/Output Summary (Last 24 hours) at 12/01/2023 1224 Last data filed at 12/01/2023 0630 Gross per 24 hour  Intake --  Output 1850 ml  Net -1850 ml     Assessment/Plan: 83 year old WF with diastolic heart failure and 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   I figured it was time  even though she is 83 she is not extremely debilitated.  Understands at least the time commitment and some issues related to incenter HD.  She did not really consider not doing it.  Given K of 7 and most likely uremic sxms , I think it was best to go ahead and start dialysis-  she is agreeable- line now placed and  first HD 6/21-  .   Plan for second HD on Monday-  will need TDC and AV access this coming week as well as to get renal navigator involved-  desire [facility] 2. Hypertension/volume  - BP was highish and she is volume overloaded-  UF as able with HD-  also continue her home BP meds  3. Hyperkalemia-  got short term tx, then HD-  is resolved 4. Anemia  - hgb in the 8's. Will check iron  ( was OK)  and add ESA -   hold in the setting of inc bladder mass-  observe for transfusion need 5. GU-  checked urinalysis and kidney imaging to make sure no issue there that might be reversible.  Now see that she has hydro - doesn't appear to be new however ?  She  is followed by [facility] in [place]-  gets stent changed-  also with this enlarging bladder mass which does appear to be new-  talked to GU-  they felt the appearance of hydro goes along with stent being in place-  they recommended placing foley-  she is making urine so really does not seem obstructed.  They did not recommend doing anything acutely for bladder mass     Curtis DELENA Heman    Labs: Basic Metabolic Panel: Recent Labs  Lab 11/29/23 1942 11/30/23 0755 12/01/23 0639  NA 139 139 137  K 6.0* 5.5* 4.4  CL 107 108 103  CO2 16* 17* 23  GLUCOSE 141* 122* 127*  BUN 97* 95* 49*  CREATININE 6.28* 6.42* 3.90*  CALCIUM  8.2* 8.0* 7.7*  PHOS 5.8* 6.2*  --    Liver Function Tests: Recent Labs  Lab 11/28/23 0839 11/29/23 1253 11/29/23 1942 11/30/23 0755  AST 13 14*  --   --   ALT 13 16  --   --   ALKPHOS 116 71  --   --  BILITOT 0.2 0.6  --   --   PROT 6.6 6.5  --   --   ALBUMIN  3.8 2.8* 2.6* 2.5*   No results for input(s): LIPASE, AMYLASE in the last 168 hours. No results for input(s): AMMONIA in the last 168 hours. CBC: Recent Labs  Lab 11/28/23 0839 11/29/23 1316 11/30/23 0754  WBC 20.8* 15.9* 15.2*  NEUTROABS 17.7* 13.8*  --   HGB 8.6* 8.1* 7.4*  HCT 27.3* 26.8* 23.8*  MCV 98* 101.9* 99.2  PLT 503* 481* 422*   Cardiac Enzymes: No results for input(s): CKTOTAL, CKMB, CKMBINDEX, TROPONINI in the last 168 hours. CBG: No results for input(s): GLUCAP in the last 168 hours.  Iron Studies:  Recent Labs    11/30/23 0754  IRON 83  TIBC 230*  FERRITIN 190   Studies/Results: DG CHEST PORT 1 VIEW Result Date: 11/30/2023 CLINICAL DATA:  Status post central line placement EXAM: PORTABLE CHEST 1 VIEW COMPARISON:  Chest radiograph dated 11/29/2023 FINDINGS: Lines/tubes: Right internal jugular venous catheter tip projects over the superior cavoatrial junction. Lungs: Low lung volumes with bronchovascular crowding. No focal consolidation. Pleura:  Blunting of bilateral costophrenic angles.  No pneumothorax. Heart/mediastinum: The heart size and mediastinal contours are within normal limits. Bones: No acute osseous abnormality. Scattered right upper quadrant and midline epigastric surgical clips. IMPRESSION: 1. Right internal jugular venous catheter tip projects over the superior cavoatrial junction. No pneumothorax. 2. Low lung volumes with bronchovascular crowding. 3. Blunting of bilateral costophrenic angles, which may represent trace pleural effusions. Electronically Signed   By: Limin  Xu M.D.   On: 11/30/2023 14:03   US  RENAL Result Date: 11/29/2023 CLINICAL DATA:  High serum creatinine ureteral stent bladder mass on CT EXAM: RENAL / URINARY TRACT ULTRASOUND COMPLETE COMPARISON:  CT 11/29/2023 in FINDINGS: Right Kidney: Renal measurements: 11.5 x 6.3 x 5.4 cm = volume: 218 mL. Several renal cysts again demonstrated measuring 3.5 and 1.9 cm. Moderate hydronephrosis and proximal hydroureter. LEFT kidney Absent Bladder: Poorly evaluated IMPRESSION: 1. Moderate right hydronephrosis and hydroureter. 2. Right renal cysts. 3. Bladder poorly evaluated. Electronically Signed   By: Jackquline Boxer M.D.   On: 11/29/2023 19:40   CT Renal Stone Study Result Date: 11/29/2023 CLINICAL DATA:  Acute renal failure superimposed on stage 5 chronic kidney disease. Previous left nephrectomy for renal cell carcinoma. Known bladder cancer. Abdomen and flank pain. EXAM: CT ABDOMEN AND PELVIS WITHOUT CONTRAST TECHNIQUE: Multidetector CT imaging of the abdomen and pelvis was performed following the standard protocol without IV contrast. RADIATION DOSE REDUCTION: This exam was performed according to the departmental dose-optimization program which includes automated exposure control, adjustment of the mA and/or kV according to patient size and/or use of iterative reconstruction technique. COMPARISON:  08/03/2022, 12/18/2022, 07/20/2022, 02/15/2022 and 01/26/2021. Pelvis CT  dated 11/25/2023. FINDINGS: Lower chest: Stable mildly enlarged heart. Mild bibasilar scarring/atelectasis without significant change. Hepatobiliary: No focal liver abnormality is seen. Status post cholecystectomy. No biliary dilatation. Pancreas: Moderate diffuse pancreatic atrophy. Spleen: Normal in size without focal abnormality. Adrenals/Urinary Tract: Stable right renal simple and hyperdense cysts. Moderate to marked right hydronephrosis and hydroureter to the level of the urinary bladder with a stable right ureteral stent. 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 This is not changed since multiple previous examinations dating back to 01/26/2021. Normal-appearing right adrenal gland. Small amount of urine in the urinary bladder. Slowly enlarging bladder mass which initially started on the right and now extends across to the  left with a ureteral stent extending through the mass into the bladder. This mass measures 5.9 x 3.2 cm on coronal image number 54/6 and 4.0 cm in AP diameter on axial image number 62/3. This corresponds to the patient's known bladder cancer. Stomach/Bowel: Multiple diverticula throughout the colon, most numerous in the sigmoid region. No evidence of diverticulitis. The appendix is not visualized. No secondary signs of appendicitis. Unremarkable stomach and small bowel. Vascular/Lymphatic: Atheromatous arterial calcifications without aneurysm. No enlarged lymph nodes. Reproductive: Status post hysterectomy. No adnexal masses. Other: Very small umbilical and supraumbilical ventral hernia containing fat. Musculoskeletal: Stable lumbar spine postoperative and degenerative changes. Stable 10% L1 superior endplate compression deformity with no acute fracture lines and minimal bony retropulsion. IMPRESSION: 1. Slowly enlarging bladder mass, as described above, corresponding to the patient's known bladder cancer. 2. Moderate to marked right hydronephrosis and  hydroureter to the level of the urinary bladder with mild progression and a stable right ureteral stent. This remains concerning for stent failure associated with the enlarging bladder mass. 3. 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 4. Colonic diverticulosis. 5. Stable mild cardiomegaly. Electronically Signed   By: Elspeth Bathe M.D.   On: 11/29/2023 18:04   DG Chest 2 View Result Date: 11/29/2023 CLINICAL DATA:  Chest pain. EXAM: CHEST - 2 VIEW COMPARISON:  August 03, 2022 FINDINGS: There is been interval removal of the right-sided venous Port-A-Cath seen on the prior study. The cardiac silhouette is mildly enlarged and unchanged in size. There is marked severity calcification of the thoracic aorta. No acute infiltrate, pleural effusion or pneumothorax is identified. A chronic appearing deformity is seen involving the distal right upper. Degenerative changes seen throughout the thoracic spine. IMPRESSION: 1. Interval removal of the right-sided venous Port-A-Cath. 2. Stable cardiomegaly without acute cardiopulmonary disease. Electronically Signed   By: Suzen Dials M.D.   On: 11/29/2023 16:44   Medications: Infusions:  ceFEPime  (MAXIPIME ) IV 1 g (11/30/23 1846)   vancomycin  750 mg (12/01/23 1147)    Scheduled Medications:  amLODipine   10 mg Oral q AM   atorvastatin   40 mg Oral QHS   Chlorhexidine  Gluconate Cloth  6 each Topical Q0600   ferrous sulfate   325 mg Oral q AM   heparin   5,000 Units Subcutaneous Q8H   hydrALAZINE   50 mg Oral TID   levothyroxine   100 mcg Oral Q0600   lidocaine   1 patch Transdermal Q24H   pantoprazole   40 mg Oral Daily   rOPINIRole   2 mg Oral BID   vancomycin  variable dose per unstable renal function (pharmacist dosing)   Does not apply See admin instructions    have reviewed scheduled and prn medications.  Physical Exam: General: alert, NAD Heart:RRR Lungs: mostly clear-  rib fracture pain  Abdomen: soft, non  tender Extremities: 1+ edema  Dialysis Access: new right sided temp cath  - placed 6/21   12/01/2023,12:24 PM  LOS: 2 days

## 2023-12-01 NOTE — Plan of Care (Signed)

## 2023-12-02 ENCOUNTER — Ambulatory Visit: Admitting: Family Medicine

## 2023-12-02 DIAGNOSIS — I5032 Chronic diastolic (congestive) heart failure: Secondary | ICD-10-CM | POA: Diagnosis not present

## 2023-12-02 DIAGNOSIS — N178 Other acute kidney failure: Secondary | ICD-10-CM | POA: Diagnosis not present

## 2023-12-02 DIAGNOSIS — J410 Simple chronic bronchitis: Secondary | ICD-10-CM | POA: Diagnosis not present

## 2023-12-02 DIAGNOSIS — N185 Chronic kidney disease, stage 5: Secondary | ICD-10-CM | POA: Diagnosis not present

## 2023-12-02 LAB — RENAL FUNCTION PANEL
Albumin: 2 g/dL — ABNORMAL LOW (ref 3.5–5.0)
Anion gap: 13 (ref 5–15)
BUN: 56 mg/dL — ABNORMAL HIGH (ref 8–23)
CO2: 18 mmol/L — ABNORMAL LOW (ref 22–32)
Calcium: 7.5 mg/dL — ABNORMAL LOW (ref 8.9–10.3)
Chloride: 105 mmol/L (ref 98–111)
Creatinine, Ser: 4.47 mg/dL — ABNORMAL HIGH (ref 0.44–1.00)
GFR, Estimated: 9 mL/min — ABNORMAL LOW (ref >60)
Glucose, Bld: 102 mg/dL — ABNORMAL HIGH (ref 70–99)
Phosphorus: 4.9 mg/dL — ABNORMAL HIGH (ref 2.5–4.6)
Potassium: 5.7 mmol/L — ABNORMAL HIGH (ref 3.5–5.1)
Sodium: 136 mmol/L (ref 135–145)

## 2023-12-02 LAB — CBC
HCT: 23.5 % — ABNORMAL LOW (ref 36.0–46.0)
Hemoglobin: 7 g/dL — ABNORMAL LOW (ref 12.0–15.0)
MCH: 31.3 pg (ref 26.0–34.0)
MCHC: 29.8 g/dL — ABNORMAL LOW (ref 30.0–36.0)
MCV: 104.9 fL — ABNORMAL HIGH (ref 80.0–100.0)
Platelets: 362 10*3/uL (ref 150–400)
RBC: 2.24 MIL/uL — ABNORMAL LOW (ref 3.87–5.11)
RDW: 16.3 % — ABNORMAL HIGH (ref 11.5–15.5)
WBC: 13.8 10*3/uL — ABNORMAL HIGH (ref 4.0–10.5)
nRBC: 0 % (ref 0.0–0.2)

## 2023-12-02 MED ORDER — PENTAFLUOROPROP-TETRAFLUOROETH EX AERO: 1.0000 | INHALATION_SPRAY | CUTANEOUS | Status: DC | PRN

## 2023-12-02 MED ORDER — HEPARIN SODIUM (PORCINE) 1000 UNIT/ML IJ SOLN
2800.0000 [IU] | Freq: Once | INTRAMUSCULAR | Status: AC
  Administered 2023-12-02: 2800 [IU]

## 2023-12-02 MED ORDER — ALTEPLASE 2 MG IJ SOLR: 2.0000 mg | Freq: Once | INTRAMUSCULAR | Status: DC | PRN

## 2023-12-02 MED ORDER — HEPARIN SODIUM (PORCINE) 1000 UNIT/ML IJ SOLN
INTRAMUSCULAR | Status: AC
Start: 2023-12-02 — End: 2023-12-02
  Filled 2023-12-02: qty 3

## 2023-12-02 MED ORDER — LIDOCAINE HCL (PF) 1 % IJ SOLN: 5.0000 mL | INTRAMUSCULAR | Status: DC | PRN

## 2023-12-02 MED ORDER — SODIUM ZIRCONIUM CYCLOSILICATE 5 G PO PACK
5.0000 g | PACK | Freq: Every day | ORAL | Status: AC
  Administered 2023-12-02: 5 g via ORAL
  Filled 2023-12-02: qty 1

## 2023-12-02 MED ORDER — NEPRO/CARBSTEADY PO LIQD
237.0000 mL | Freq: Two times a day (BID) | ORAL | Status: DC
  Administered 2023-12-03 – 2023-12-05 (×3): 237 mL via ORAL

## 2023-12-02 MED ORDER — ANTICOAGULANT SODIUM CITRATE 4% (200MG/5ML) IV SOLN: 5.0000 mL | Status: DC | PRN

## 2023-12-02 MED ORDER — LIDOCAINE-PRILOCAINE 2.5-2.5 % EX CREA: 1.0000 | TOPICAL_CREAM | CUTANEOUS | Status: DC | PRN

## 2023-12-02 MED ORDER — MUPIROCIN 2 % EX OINT
1.0000 | TOPICAL_OINTMENT | Freq: Two times a day (BID) | CUTANEOUS | Status: DC
  Administered 2023-12-02 – 2023-12-05 (×6): 1 via NASAL
  Filled 2023-12-02: qty 22

## 2023-12-02 MED ORDER — HEPARIN SODIUM (PORCINE) 1000 UNIT/ML DIALYSIS: 1000.0000 [IU] | INTRAMUSCULAR | Status: DC | PRN

## 2023-12-02 NOTE — Evaluation (Signed)
 Physical Therapy Evaluation Patient Details Name: Alexandra [name] MRN: [id number] DOB: Jul 05, 1940 Today's Date: 12/02/2023  History of Present Illness  Pt is an 83 year old woman admitted on 11/29/23 with acute on chronic renal failure, hyperkalemia, SIRS. Pt started HD this admission. PMH: CKD 4, CHF, renal cancer with L nephrectomy, R hydronephrosis s/p stenting, COPD, HLD, osteoporosis, restless leg, urinary incontinence, UTIs, gout. Pt with recent fall resulting in R rib fractures.  Clinical Impression  Pt presents with admitting diagnosis above. Co-treat with OT. Pt today was limited by nausea however was able to transfer to chair with RW CGA. PTA pt was Mod I with rollator caretaking for her husband. Recommend HHPT upon DC. PT will continue to follow.         If plan is discharge home, recommend the following: A little help with walking and/or transfers;A little help with bathing/dressing/bathroom;Assistance with cooking/housework;Assist for transportation;Direct supervision/assist for medications management   Can travel by private vehicle        Equipment Recommendations Rolling walker (2 wheels)  Recommendations for Other Services       Functional Status Assessment Patient has had a recent decline in their functional status and demonstrates the ability to make significant improvements in function in a reasonable and predictable amount of time.     Precautions / Restrictions Precautions Precautions: Fall Recall of Precautions/Restrictions: Intact Restrictions Weight Bearing Restrictions Per Provider Order: No      Mobility  Bed Mobility Overal bed mobility: Needs Assistance Bed Mobility: Supine to Sit, Sit to Supine     Supine to sit: Min assist, HOB elevated Sit to supine: Supervision   General bed mobility comments: assist to raise trunk, less rib pain when getting up to L side of bed    Transfers Overall transfer level: Needs assistance Equipment used: Rolling  walker (2 wheels) Transfers: Sit to/from Stand, Bed to chair/wheelchair/BSC Sit to Stand: Contact guard assist   Step pivot transfers: Contact guard assist       General transfer comment: cues for hand placement, increased time    Ambulation/Gait               General Gait Details: Limited by nausea  Stairs            Wheelchair Mobility     Tilt Bed    Modified Rankin (Stroke Patients Only)       Balance Overall balance assessment: Needs assistance   Sitting balance-Leahy Scale: Good     Standing balance support: Bilateral upper extremity supported Standing balance-Leahy Scale: Poor Standing balance comment: reliant on RW                             Pertinent Vitals/Pain Pain Assessment Pain Assessment: 0-10 Pain Score: 7  Pain Location: R ribs Pain Descriptors / Indicators: Constant, Aching, Grimacing Pain Intervention(s): Monitored during session, Limited activity within patient's tolerance    Home Living Family/patient expects to be discharged to:: Private residence Living Arrangements: Spouse/significant other Available Help at Discharge: Family;Available 24 hours/day Type of Home: Mobile home Home Access: Ramped entrance       Home Layout: One level Home Equipment: Shower seat - built in;Toilet riser;Rollator (4 wheels);Grab bars - tub/shower;Grab bars - toilet;Hand held shower head;Hospital bed;Electric scooter;Wheelchair - power;Wheelchair - manual      Prior Function Prior Level of Function : Independent/Modified Independent;Driving  Mobility Comments: Mod I rollator ADLs Comments: Ind, pt is the caregiver of her husband     Extremity/Trunk Assessment   Upper Extremity Assessment Upper Extremity Assessment: Overall WFL for tasks assessed    Lower Extremity Assessment Lower Extremity Assessment: Overall WFL for tasks assessed    Cervical / Trunk Assessment Cervical / Trunk Assessment: Kyphotic   Communication   Communication Communication: No apparent difficulties    Cognition Arousal: Alert Behavior During Therapy: WFL for tasks assessed/performed                             Following commands: Intact       Cueing Cueing Techniques: Verbal cues     General Comments General comments (skin integrity, edema, etc.): VSS    Exercises     Assessment/Plan    PT Assessment Patient needs continued PT services  PT Problem List Decreased strength;Decreased range of motion;Decreased activity tolerance;Decreased balance;Decreased mobility;Decreased coordination;Decreased knowledge of use of DME;Decreased safety awareness;Decreased knowledge of precautions;Cardiopulmonary status limiting activity       PT Treatment Interventions DME instruction;Gait training;Stair training;Functional mobility training;Therapeutic activities;Therapeutic exercise;Balance training;Neuromuscular re-education;Patient/family education    PT Goals (Current goals can be found in the Care Plan section)  Acute Rehab PT Goals Patient Stated Goal: to get better PT Goal Formulation: With patient Time For Goal Achievement: 12/16/23 Potential to Achieve Goals: Good    Frequency Min 3X/week     Co-evaluation               AM-PAC PT 6 Clicks Mobility  Outcome Measure Help needed turning from your back to your side while in a flat bed without using bedrails?: A Little Help needed moving from lying on your back to sitting on the side of a flat bed without using bedrails?: A Little Help needed moving to and from a bed to a chair (including a wheelchair)?: A Little Help needed standing up from a chair using your arms (e.g., wheelchair or bedside chair)?: A Little Help needed to walk in hospital room?: A Lot Help needed climbing 3-5 steps with a railing? : A Lot 6 Click Score: 16    End of Session Equipment Utilized During Treatment: Gait belt Activity Tolerance: Patient tolerated  treatment well Patient left: in chair;with call bell/phone within reach;with nursing/sitter in room Nurse Communication: Mobility status PT Visit Diagnosis: Other abnormalities of gait and mobility (R26.89)    Time: 8956-8891 PT Time Calculation (min) (ACUTE ONLY): 25 min   Charges:   PT Evaluation $PT Eval Moderate Complexity: 1 Mod   PT General Charges $$ ACUTE PT VISIT: 1 Visit         Alexandra [name], PT, DPT Acute Rehab Services [phone]   Dnyla Antonetti 12/02/2023, 11:41 AM

## 2023-12-02 NOTE — Evaluation (Signed)
 Occupational Therapy Evaluation Patient Details Name: Alexandra [name] MRN: [id number] DOB: [birth year] Today's Date: 12/02/2023   History of Present Illness   Pt is an 83 year old woman admitted on 11/29/23 with acute on chronic renal failure, hyperkalemia, SIRS. Pt started HD this admission. PMH: CKD 4, CHF, renal cancer with L nephrectomy, R hydronephrosis s/p stenting, COPD, HLD, osteoporosis, restless leg, urinary incontinence, UTIs, gout. Pt with recent fall resulting in R rib fractures.     Clinical Impressions Pt typically walks with a rollator, drives and is modified independent in self care. She lives with her husband who cannot physically assist and has family that can help intermittently. Pt presents with nausea with dry heaves, R rib pain, generalized weakness and impaired standing balance. She needs min assist for bed mobility and CGA with RW to stand and transfer to chair. She requires up to min assist for ADLs. SpO2 noted to drop to 88% on RA, replaced 1L O2 with rebound to 96%. Encouraged use of IS when nausea subsides. RN provided nausea meds at end of session. Anticipate pt will be able to return home with [facility] upon discharge.      If plan is discharge home, recommend the following:   A little help with walking and/or transfers;A little help with bathing/dressing/bathroom;Assistance with cooking/housework;Assist for transportation;Help with stairs or ramp for entrance     Functional Status Assessment   Patient has had a recent decline in their functional status and demonstrates the ability to make significant improvements in function in a reasonable and predictable amount of time.     Equipment Recommendations   None recommended by OT     Recommendations for Other Services         Precautions/Restrictions   Precautions Precautions: Fall Recall of Precautions/Restrictions: Intact Restrictions Weight Bearing Restrictions Per Provider Order: No      Mobility Bed Mobility Overal bed mobility: Needs Assistance Bed Mobility: Supine to Sit, Sit to Supine     Supine to sit: Min assist, HOB elevated Sit to supine: Supervision   General bed mobility comments: assist to raise trunk, less rib pain when getting up to L side of bed    Transfers Overall transfer level: Needs assistance Equipment used: Rolling walker (2 wheels) Transfers: Sit to/from Stand, Bed to chair/wheelchair/BSC Sit to Stand: Contact guard assist     Step pivot transfers: Contact guard assist     General transfer comment: cues for hand placement, increased time      Balance Overall balance assessment: Needs assistance   Sitting balance-Leahy Scale: Good     Standing balance support: Bilateral upper extremity supported Standing balance-Leahy Scale: Poor Standing balance comment: reliant on RW                           ADL either performed or assessed with clinical judgement   ADL Overall ADL's : Needs assistance/impaired Eating/Feeding: Independent;Bed level   Grooming: Set up;Sitting   Upper Body Bathing: Set up;Sitting   Lower Body Bathing: Minimal assistance;Sit to/from stand   Upper Body Dressing : Set up;Sitting   Lower Body Dressing: Minimal assistance;Sit to/from stand   Toilet Transfer: Contact guard assist;Rolling walker (2 wheels)   Toileting- Clothing Manipulation and Hygiene: Contact guard assist;Sit to/from stand               Vision Baseline Vision/History: 1 Wears glasses Ability to See in Adequate Light: 0 Adequate Patient Visual Report: No change  from baseline       Perception         Praxis         Pertinent Vitals/Pain Pain Assessment Pain Assessment: Faces Pain Score: 7  Pain Location: R ribs Pain Descriptors / Indicators: Constant, Aching, Grimacing Pain Intervention(s): Monitored during session, Repositioned     Extremity/Trunk Assessment Upper Extremity Assessment Upper Extremity  Assessment: Overall WFL for tasks assessed   Lower Extremity Assessment Lower Extremity Assessment: Defer to PT evaluation   Cervical / Trunk Assessment Cervical / Trunk Assessment: Kyphotic   Communication Communication Communication: No apparent difficulties   Cognition Arousal: Alert Behavior During Therapy: WFL for tasks assessed/performed Cognition: No apparent impairments                               Following commands: Intact       Cueing  General Comments   Cueing Techniques: Verbal cues      Exercises     Shoulder Instructions      Home Living Family/patient expects to be discharged to:: Private residence Living Arrangements: Spouse/significant other Available Help at Discharge: Family;Available 24 hours/day Type of Home: Mobile home Home Access: Ramped entrance     Home Layout: One level     Bathroom Shower/Tub: Walk-in shower;Tub/shower unit   [employer]: Standard     Home Equipment: Shower seat - built in;Toilet riser;Rollator (4 wheels);Grab bars - tub/shower;Grab bars - toilet;Hand held shower head;Hospital bed;Electric scooter;Wheelchair - power;Wheelchair - manual          Prior Functioning/Environment Prior Level of Function : Independent/Modified Independent;Driving             Mobility Comments: Mod I rollator ADLs Comments: Ind, pt is the caregiver of her husband    OT Problem List: Impaired balance (sitting and/or standing);Decreased strength;Pain;Decreased knowledge of use of DME or AE   OT Treatment/Interventions: Self-care/ADL training;DME and/or AE instruction;Therapeutic activities;Patient/family education;Balance training      OT Goals(Current goals can be found in the care plan section)   Acute Rehab OT Goals OT Goal Formulation: With patient Time For Goal Achievement: 12/16/23 Potential to Achieve Goals: Good ADL Goals Pt Will Perform Grooming: with modified independence;standing Pt Will  Perform Lower Body Bathing: with modified independence;sit to/from stand Pt Will Perform Lower Body Dressing: with modified independence;sit to/from stand Pt Will Transfer to Toilet: with modified independence;ambulating;regular height toilet Pt Will Perform Toileting - Clothing Manipulation and hygiene: with modified independence;sit to/from stand Additional ADL Goal #1: Pt will perform bed mobility mod I.   OT Frequency:  Min 2X/week    Co-evaluation              AM-PAC OT 6 Clicks Daily Activity     Outcome Measure Help from another person eating meals?: None Help from another person taking care of personal grooming?: A Little Help from another person toileting, which includes using toliet, bedpan, or urinal?: A Little Help from another person bathing (including washing, rinsing, drying)?: A Little Help from another person to put on and taking off regular upper body clothing?: A Little Help from another person to put on and taking off regular lower body clothing?: A Little 6 Click Score: 19   End of Session Equipment Utilized During Treatment: Gait belt;Rolling walker (2 wheels);Oxygen  Nurse Communication: Other (comment) (brought nausea meds)  Activity Tolerance: Treatment limited secondary to medical complications (Comment) (nausea with dry heaving) Patient left: in chair;with  call bell/phone within reach;with nursing/sitter in room  OT Visit Diagnosis: Unsteadiness on feet (R26.81);Pain;Muscle weakness (generalized) (M62.81)                Time: 8956-8891 OT Time Calculation (min): 25 min Charges:  OT General Charges $OT Visit: 1 Visit OT Evaluation $OT Eval Moderate Complexity: 1 Mod  Mliss HERO, OTR/L Acute Rehabilitation Services Office: [phone]   Kennth Mliss Helling 12/02/2023, 11:22 AM

## 2023-12-02 NOTE — Progress Notes (Addendum)
 Patient still in dialysis. Report given to [place], rn

## 2023-12-02 NOTE — Progress Notes (Signed)
Patient off of unit for dialysis.

## 2023-12-02 NOTE — Plan of Care (Signed)
  Problem: Education: Goal: Knowledge of General Education information will improve Description: Including pain rating scale, medication(s)/side effects and non-pharmacologic comfort measures Outcome: Progressing   Problem: Health Behavior/Discharge Planning: Goal: Ability to manage health-related needs will improve Outcome: Progressing   Problem: Clinical Measurements: Goal: Ability to maintain clinical measurements within normal limits will improve Outcome: Progressing Goal: Will remain free from infection Outcome: Progressing Goal: Cardiovascular complication will be avoided Outcome: Progressing   Problem: Activity: Goal: Risk for activity intolerance will decrease Outcome: Progressing   Problem: Coping: Goal: Level of anxiety will decrease Outcome: Progressing   

## 2023-12-02 NOTE — Progress Notes (Addendum)
 PROGRESS NOTE        PATIENT DETAILS Name: Alexandra [name] Age: 83 y.o. Sex: female Date of Birth: 1940/11/22 Admit Date: 11/29/2023 Admitting Physician Lebron JINNY Cage, MD PCP:Cox, Abigail, MD  Brief Summary: Patient is a 83 y.o.  female with history of CKD 4, HTN, chronic right-sided hydronephrosis-s/p stent (changed every 3 months), renal cell carcinoma-s/p left nephrectomy 1999-sent to the ED for abnormal labs with a creatinine of 7.09.  Significant events: 6/20>> admit to [facility]  Significant studies: 6/20> CXR: No pneumonia 6/20>> CT renal stone study: Slowly enlarging bladder mass-known bladder cancer-moderate to marked right hydronephrosis/hydroureter to the level of the bladder. 6/20>> renal ultrasound: Moderate right hydronephrosis/right hydroureter.  Significant microbiology data: 6/20>> blood culture: No growth  Procedures: 6/21>> nontunneled dialysis catheter by PCCM.  Consults: Nephrology  Subjective: Some nausea but no vomiting.  No diarrhea.  No chest pain or shortness of breath  Objective: Vitals: Blood pressure (!) 120/46, pulse 86, temperature 98 F (36.7 C), temperature source Oral, resp. rate 18, height 5' 2 (1.575 m), weight 65.8 kg, SpO2 98%.   Exam: Awake/alert Chest: Clear to auscultation CVS: S1-S2 regular Abdomen: Soft nontender nondistended Extremities: No edema.  Pertinent Labs/Radiology:    Latest Ref Rng & Units 12/02/2023    5:52 AM 11/30/2023    7:54 AM 11/29/2023    1:16 PM  CBC  WBC 4.0 - 10.5 K/uL 13.8  15.2  15.9   Hemoglobin 12.0 - 15.0 g/dL 7.0  7.4  8.1   Hematocrit 36.0 - 46.0 % 23.5  23.8  26.8   Platelets 150 - 400 K/uL 362  422  481     Lab Results  Component Value Date   NA 136 12/02/2023   K 5.7 (H) 12/02/2023   CL 105 12/02/2023   CO2 18 (L) 12/02/2023      Assessment/Plan: AKI on CKD 4 Unclear if this is AKI or progression of underlying CKD to ESRD-suspect this is the latter at  this point. Started on hemodialysis by nephrology Continue to avoid nephrotoxic agents and watch for signs of renal recovery Await further recommendations from nephrology.  Hyperkalemia Secondary to worsening renal function. Reoccurred 6/23-Lokelma  x 1-and HD later today. Repeat labs tomorrow morning.  SIRS secondary to complicated UTI Recent outpatient urine culture positive for Enterococcus Given issues with hydronephrosis/stent/bladder cancer-will plan to treat for 10 to 14 days-but narrow antibiotics to amoxicillin .  Normocytic anemia Likely due to combination of ESRD/acute illness.  No evidence of blood loss. Hb trending down-repeat CBC tomorrow Defer Aranesp /iron to nephrology service.  History of chronic right-sided hydronephrosis-s/p stent (changed every 3 months) Follows with Dr. Chao-urology in [place]  History of bladder cancer Follows with urology-per patient-this is being observed  History of renal cell carcinoma-s/p left nephrectomy in 1999  COPD Not in exacerbation Bronchodilators  Recent right-sided rib fracture Supportive care Incentive spirometry/flutter valve  Chronic HFmrEF Euvolemic Volume removal with HD  HTN Stable Continue amlodipine   HLD Statin  GERD PPI  Restless leg syndrome Requip   Hypothyroidism Synthroid   Code status:   Code Status: Limited: Do not attempt resuscitation (DNR) -DNR-LIMITED -Do Not Intubate/DNI    DVT Prophylaxis: heparin  injection 5,000 Units Start: 11/29/23 2200   Family Communication: Spouse Lamar Blecher-[phone]-called 6/23-no answer-mailbox full to leave voicemail.   Disposition Plan: Status is: Inpatient Remains inpatient appropriate because: Severity  of illness   Planned Discharge Destination:Home   Diet: Diet Order             Diet renal with fluid restriction Fluid restriction: 1500 mL Fluid; Room service appropriate? Yes; Fluid consistency: Thin  Diet effective now                      Antimicrobial agents: Anti-infectives (From admission, onward)    Start     Dose/Rate Route Frequency Ordered Stop   12/01/23 1800  amoxicillin  (AMOXIL ) capsule 500 mg        500 mg Oral Every 24 hours 12/01/23 1352 12/11/23 1759   12/01/23 1145  vancomycin  (VANCOREADY) IVPB 750 mg/150 mL        750 mg 150 mL/hr over 60 Minutes Intravenous  Once 12/01/23 1055 12/01/23 1247   11/30/23 1800  ceFEPIme  (MAXIPIME ) 1 g in sodium chloride  0.9 % 100 mL IVPB  Status:  Discontinued        1 g 200 mL/hr over 30 Minutes Intravenous Every 24 hours 11/29/23 1756 12/01/23 1301   11/29/23 1800  vancomycin  (VANCOREADY) IVPB 1250 mg/250 mL        1,250 mg 166.7 mL/hr over 90 Minutes Intravenous  Once 11/29/23 1756 11/29/23 2233   11/29/23 1800  ceFEPIme  (MAXIPIME ) 2 g in sodium chloride  0.9 % 100 mL IVPB        2 g 200 mL/hr over 30 Minutes Intravenous  Once 11/29/23 1756 11/29/23 1847   11/29/23 1755  vancomycin  variable dose per unstable renal function (pharmacist dosing)  Status:  Discontinued         Does not apply See admin instructions 11/29/23 1756 12/01/23 1301        MEDICATIONS: Scheduled Meds:  amLODipine   10 mg Oral q AM   amoxicillin   500 mg Oral Q24H   atorvastatin   40 mg Oral QHS   Chlorhexidine  Gluconate Cloth  6 each Topical Q0600   Chlorhexidine  Gluconate Cloth  6 each Topical Q0600   ferrous sulfate   325 mg Oral q AM   heparin   5,000 Units Subcutaneous Q8H   hydrALAZINE   50 mg Oral TID   levothyroxine   100 mcg Oral Q0600   lidocaine   1 patch Transdermal Q24H   mupirocin ointment  1 Application Nasal BID   pantoprazole   40 mg Oral Daily   rOPINIRole   2 mg Oral BID   Continuous Infusions:   PRN Meds:.acetaminophen  **OR** acetaminophen , heparin , HYDROcodone -acetaminophen , ipratropium-albuterol , melatonin, ondansetron  **OR** ondansetron  (ZOFRAN ) IV   I have personally reviewed following labs and imaging studies  LABORATORY DATA: CBC: Recent Labs  Lab  11/28/23 0839 11/29/23 1316 11/30/23 0754 12/02/23 0552  WBC 20.8* 15.9* 15.2* 13.8*  NEUTROABS 17.7* 13.8*  --   --   HGB 8.6* 8.1* 7.4* 7.0*  HCT 27.3* 26.8* 23.8* 23.5*  MCV 98* 101.9* 99.2 104.9*  PLT 503* 481* 422* 362    Basic Metabolic Panel: Recent Labs  Lab 11/29/23 1253 11/29/23 1942 11/29/23 1951 11/30/23 0754 11/30/23 0755 12/01/23 0639 12/02/23 0552  NA 136 139  --   --  139 137 136  K 7.0* 6.0*  --   --  5.5* 4.4 5.7*  CL 107 107  --   --  108 103 105  CO2 16* 16*  --   --  17* 23 18*  GLUCOSE 95 141*  --   --  122* 127* 102*  BUN 104* 97*  --   --  95* 49* 56*  CREATININE 6.95* 6.28*  --   --  6.42* 3.90* 4.47*  CALCIUM  8.3* 8.2*  --  8.0* 8.0* 7.7* 7.5*  MG  --   --  1.4*  --   --   --   --   PHOS  --  5.8*  --   --  6.2*  --  4.9*    GFR: Estimated Creatinine Clearance: 8.9 mL/min (A) (by C-G formula based on SCr of 4.47 mg/dL (H)).  Liver Function Tests: Recent Labs  Lab 11/28/23 0839 11/29/23 1253 11/29/23 1942 11/30/23 0755 12/02/23 0552  AST 13 14*  --   --   --   ALT 13 16  --   --   --   ALKPHOS 116 71  --   --   --   BILITOT 0.2 0.6  --   --   --   PROT 6.6 6.5  --   --   --   ALBUMIN  3.8 2.8* 2.6* 2.5* 2.0*   No results for input(s): LIPASE, AMYLASE in the last 168 hours. No results for input(s): AMMONIA in the last 168 hours.  Coagulation Profile: No results for input(s): INR, PROTIME in the last 168 hours.  Cardiac Enzymes: No results for input(s): CKTOTAL, CKMB, CKMBINDEX, TROPONINI in the last 168 hours.  BNP (last 3 results) No results for input(s): PROBNP in the last 8760 hours.  Lipid Profile: No results for input(s): CHOL, HDL, LDLCALC, TRIG, CHOLHDL, LDLDIRECT in the last 72 hours.  Thyroid  Function Tests: No results for input(s): TSH, T4TOTAL, FREET4, T3FREE, THYROIDAB in the last 72 hours.  Anemia Panel: Recent Labs    11/30/23 0754  FERRITIN 190  TIBC 230*  IRON  83    Urine analysis:    Component Value Date/Time   COLORURINE YELLOW 08/04/2022 0257   APPEARANCEUR HAZY (A) 08/04/2022 0257   LABSPEC 1.010 08/04/2022 0257   PHURINE 5.0 08/04/2022 0257   GLUCOSEU NEGATIVE 08/04/2022 0257   HGBUR MODERATE (A) 08/04/2022 0257   BILIRUBINUR negative 11/28/2023 0923   KETONESUR negative 11/28/2023 0923   KETONESUR NEGATIVE 08/04/2022 0257   PROTEINUR 100 (A) 08/04/2022 0257   UROBILINOGEN 0.2 11/28/2023 0923   NITRITE Negative 11/28/2023 0923   NITRITE NEGATIVE 08/04/2022 0257   LEUKOCYTESUR Large (3+) (A) 11/28/2023 0923   LEUKOCYTESUR LARGE (A) 08/04/2022 0257    Sepsis Labs: Lactic Acid, Venous    Component Value Date/Time   LATICACIDVEN 0.6 08/04/2022 0940    MICROBIOLOGY: Recent Results (from the past 240 hours)  Urine Culture     Status: None   Collection Time: 11/28/23  9:59 AM   Specimen: Urine   UR  Result Value Ref Range Status   Urine Culture, Routine Final report  Final   Organism ID, Bacteria Comment  Final    Comment: Mixed urogenital flora Less than 10,000 colonies/mL   MRSA Next Gen by PCR, Nasal     Status: Abnormal   Collection Time: 11/29/23  5:48 PM   Specimen: Nasal Mucosa; Nasal Swab  Result Value Ref Range Status   MRSA by PCR Next Gen DETECTED (A) NOT DETECTED Final    Comment: RESULT CALLED TO, READ BACK BY AND VERIFIED WITH: RN LOIS DUKE [phone] @ 2235 FH (NOTE) The GeneXpert MRSA Assay (FDA approved for NASAL specimens only), is one component of a comprehensive MRSA colonization surveillance program. It is not intended to diagnose MRSA infection nor to guide or monitor treatment for MRSA infections.  Test performance is not FDA approved in patients less than 65 years old. Performed at [facility], [address] [place], [place] [address]   Culture, blood (Routine X 2) w Reflex to ID Panel     Status: None (Preliminary result)   Collection Time: 11/29/23  7:42 PM   Specimen: BLOOD RIGHT  HAND  Result Value Ref Range Status   Specimen Description BLOOD RIGHT HAND  Final   Special Requests   Final    BOTTLES DRAWN AEROBIC ONLY Blood Culture adequate volume   Culture   Final    NO GROWTH 3 DAYS Performed at [facility], [address] [place], [place] [address]    Report Status PENDING  Incomplete  Culture, blood (Routine X 2) w Reflex to ID Panel     Status: None (Preliminary result)   Collection Time: 11/29/23  9:24 PM   Specimen: BLOOD RIGHT HAND  Result Value Ref Range Status   Specimen Description BLOOD RIGHT HAND  Final   Special Requests   Final    BOTTLES DRAWN AEROBIC AND ANAEROBIC Blood Culture results may not be optimal due to an inadequate volume of blood received in culture bottles   Culture   Final    NO GROWTH 3 DAYS Performed at [facility], [address] [place], [place] [address]    Report Status PENDING  Incomplete    RADIOLOGY STUDIES/RESULTS: DG CHEST PORT 1 VIEW Result Date: 11/30/2023 CLINICAL DATA:  Status post central line placement EXAM: PORTABLE CHEST 1 VIEW COMPARISON:  Chest radiograph dated 11/29/2023 FINDINGS: Lines/tubes: Right internal jugular venous catheter tip projects over the superior cavoatrial junction. Lungs: Low lung volumes with bronchovascular crowding. No focal consolidation. Pleura: Blunting of bilateral costophrenic angles.  No pneumothorax. Heart/mediastinum: The heart size and mediastinal contours are within normal limits. Bones: No acute osseous abnormality. Scattered right upper quadrant and midline epigastric surgical clips. IMPRESSION: 1. Right internal jugular venous catheter tip projects over the superior cavoatrial junction. No pneumothorax. 2. Low lung volumes with bronchovascular crowding. 3. Blunting of bilateral costophrenic angles, which may represent trace pleural effusions. Electronically Signed   By: Limin  Xu M.D.   On: 11/30/2023 14:03     LOS: 3 days   Donalda Applebaum, MD  [facility]    To contact the attending provider between 7A-7P or the covering provider during after hours 7P-7A, please log into the web site [email or web address] and access using universal [place] password for that web site. If you do not have the password, please call the hospital operator.  12/02/2023, 11:21 AM

## 2023-12-02 NOTE — Progress Notes (Addendum)
 Patient ID: Alexandra [name], female   DOB: 1940/06/24, 83 y.o.   MRN: [id number] S: No complaints or events overnight O:BP (!) 120/46 (BP Location: Left Arm)   Pulse 86   Temp 98.2 F (36.8 C) (Oral)   Resp 18   Ht 5' 2 (1.575 m)   Wt 65.8 kg   SpO2 98%   BMI 26.53 kg/m   Intake/Output Summary (Last 24 hours) at 12/02/2023 1233 Last data filed at 12/02/2023 1228 Gross per 24 hour  Intake --  Output 350 ml  Net -350 ml   Intake/Output: I/O last 3 completed shifts: In: -  Out: 850 [Urine:850]  Intake/Output this shift:  Total I/O In: -  Out: 350 [Urine:350] Weight change:  Gen: NAD CVS: RRR Resp: scattered rhonchi bilaterally Abd: +BS, soft, NT/ND Ext: no edema  Recent Labs  Lab 11/28/23 0839 11/29/23 1253 11/29/23 1942 11/30/23 0754 11/30/23 0755 12/01/23 0639 12/02/23 0552  NA 137 136 139  --  139 137 136  K CANCELED 7.0* 6.0*  --  5.5* 4.4 5.7*  CL 105 107 107  --  108 103 105  CO2 10* 16* 16*  --  17* 23 18*  GLUCOSE 104* 95 141*  --  122* 127* 102*  BUN 96* 104* 97*  --  95* 49* 56*  CREATININE 7.09* 6.95* 6.28*  --  6.42* 3.90* 4.47*  ALBUMIN  3.8 2.8* 2.6*  --  2.5*  --  2.0*  CALCIUM  8.6* 8.3* 8.2* 8.0* 8.0* 7.7* 7.5*  PHOS  --   --  5.8*  --  6.2*  --  4.9*  AST 13 14*  --   --   --   --   --   ALT 13 16  --   --   --   --   --    Liver Function Tests: Recent Labs  Lab 11/28/23 0839 11/29/23 1253 11/29/23 1253 11/29/23 1942 11/30/23 0755 12/02/23 0552  AST 13 14*  --   --   --   --   ALT 13 16  --   --   --   --   ALKPHOS 116 71  --   --   --   --   BILITOT 0.2 0.6  --   --   --   --   PROT 6.6 6.5  --   --   --   --   ALBUMIN  3.8 2.8*   < > 2.6* 2.5* 2.0*   < > = values in this interval not displayed.   No results for input(s): LIPASE, AMYLASE in the last 168 hours. No results for input(s): AMMONIA in the last 168 hours. CBC: Recent Labs  Lab 11/28/23 0839 11/29/23 1316 11/30/23 0754 12/02/23 0552  WBC 20.8* 15.9* 15.2*  13.8*  NEUTROABS 17.7* 13.8*  --   --   HGB 8.6* 8.1* 7.4* 7.0*  HCT 27.3* 26.8* 23.8* 23.5*  MCV 98* 101.9* 99.2 104.9*  PLT 503* 481* 422* 362   Cardiac Enzymes: No results for input(s): CKTOTAL, CKMB, CKMBINDEX, TROPONINI in the last 168 hours. CBG: No results for input(s): GLUCAP in the last 168 hours.  Iron Studies:  Recent Labs    11/30/23 0754  IRON 83  TIBC 230*  FERRITIN 190   Studies/Results: DG CHEST PORT 1 VIEW Result Date: 11/30/2023 CLINICAL DATA:  Status post central line placement EXAM: PORTABLE CHEST 1 VIEW COMPARISON:  Chest radiograph dated 11/29/2023 FINDINGS: Lines/tubes: Right internal jugular venous catheter  tip projects over the superior cavoatrial junction. Lungs: Low lung volumes with bronchovascular crowding. No focal consolidation. Pleura: Blunting of bilateral costophrenic angles.  No pneumothorax. Heart/mediastinum: The heart size and mediastinal contours are within normal limits. Bones: No acute osseous abnormality. Scattered right upper quadrant and midline epigastric surgical clips. IMPRESSION: 1. Right internal jugular venous catheter tip projects over the superior cavoatrial junction. No pneumothorax. 2. Low lung volumes with bronchovascular crowding. 3. Blunting of bilateral costophrenic angles, which may represent trace pleural effusions. Electronically Signed   By: Limin  Xu M.D.   On: 11/30/2023 14:03    amLODipine   10 mg Oral q AM   amoxicillin   500 mg Oral Q24H   atorvastatin   40 mg Oral QHS   Chlorhexidine  Gluconate Cloth  6 each Topical Q0600   Chlorhexidine  Gluconate Cloth  6 each Topical Q0600   feeding supplement (NEPRO CARB STEADY)  237 mL Oral BID BM   ferrous sulfate   325 mg Oral q AM   heparin   5,000 Units Subcutaneous Q8H   hydrALAZINE   50 mg Oral TID   levothyroxine   100 mcg Oral Q0600   lidocaine   1 patch Transdermal Q24H   mupirocin ointment  1 Application Nasal BID   pantoprazole   40 mg Oral Daily   rOPINIRole   2 mg  Oral BID    BMET    Component Value Date/Time   NA 136 12/02/2023 0552   NA 137 11/28/2023 0839   K 5.7 (H) 12/02/2023 0552   CL 105 12/02/2023 0552   CO2 18 (L) 12/02/2023 0552   GLUCOSE 102 (H) 12/02/2023 0552   BUN 56 (H) 12/02/2023 0552   BUN 96 (HH) 11/28/2023 0839   CREATININE 4.47 (H) 12/02/2023 0552   CREATININE 3.05 (HH) 06/13/2022 1021   CALCIUM  7.5 (L) 12/02/2023 0552   CALCIUM  8.0 (L) 11/30/2023 0754   GFRNONAA 9 (L) 12/02/2023 0552   GFRNONAA 15 (L) 06/13/2022 1021   GFRAA 29 (L) 03/28/2020 0753   CBC    Component Value Date/Time   WBC 13.8 (H) 12/02/2023 0552   RBC 2.24 (L) 12/02/2023 0552   HGB 7.0 (L) 12/02/2023 0552   HGB 8.6 (L) 11/28/2023 0839   HCT 23.5 (L) 12/02/2023 0552   HCT 27.3 (L) 11/28/2023 0839   PLT 362 12/02/2023 0552   PLT 503 (H) 11/28/2023 0839   MCV 104.9 (H) 12/02/2023 0552   MCV 98 (H) 11/28/2023 0839   MCH 31.3 12/02/2023 0552   MCHC 29.8 (L) 12/02/2023 0552   RDW 16.3 (H) 12/02/2023 0552   RDW 13.6 11/28/2023 0839   LYMPHSABS 1.0 11/29/2023 1316   LYMPHSABS 1.5 11/28/2023 0839   MONOABS 0.8 11/29/2023 1316   EOSABS 0.0 11/29/2023 1316   EOSABS 0.1 11/28/2023 0839   BASOSABS 0.0 11/29/2023 1316   BASOSABS 0.0 11/28/2023 0839     Assessment/Plan: 83 year old WF with diastolic heart failure and 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   I figured it was time  even though she is 29 she is not extremely debilitated.  Understands at least the time commitment and some issues related to incenter HD.  She did not really consider not doing it.  Given K of 7 and most likely uremic sxms , I think it was  best to go ahead and start dialysis-  she is agreeable- line now placed and  first HD 6/21-  .   Plan for second HD on Monday-  will need TDC and AV access  this coming week as well as to get renal navigator involved-  desire [facility] 2. Hypertension/volume  - BP was highish and she is volume overloaded-  UF as able with HD-  also continue her home BP meds  3. Hyperkalemia-  got short term tx, then HD-  is resolved 4. Anemia  - hgb was in the 8's, now down to 7.  Transfuse per primary svc. Will check iron  ( was OK)  and add ESA -   hold in the setting of inc bladder mass-  observe for transfusion need 5. GU-  checked urinalysis and kidney imaging to make sure no issue there that might be reversible.  Now see that she has hydro - doesn't appear to be new however ?  She is followed by [facility] in [place]-  gets stent changed-  also with this enlarging bladder mass which does appear to be new-  talked to GU-  they felt the appearance of hydro goes along with stent being in place-  they recommended placing foley-  she is making urine so really does not seem obstructed.  They did not recommend doing anything acutely for bladder mass  Fairy RONAL Sellar, MD [facility]

## 2023-12-02 NOTE — Progress Notes (Signed)
   12/02/23 1000  Spiritual Encounters  Type of Visit Initial  Care provided to: Patient  Referral source Patient request  Reason for visit Routine spiritual support  OnCall Visit No  Spiritual Framework  Presenting Themes Meaning/purpose/sources of inspiration;Values and beliefs  Family Stress Factors None identified  Interventions  Spiritual Care Interventions Made Established relationship of care and support;Compassionate presence;Reflective listening;Prayer;Encouragement;Narrative/life review  Spiritual Care Plan  Spiritual Care Issues Still Outstanding No further spiritual care needs at this time (see row info)   Chaplain responded and complied to request for prayer.  Patient was pleasant and requested Chaplain return for additional visits.  Chaplain will follow as appropriate.  Chaplain spiritual support services remain available as the need arises.

## 2023-12-02 NOTE — Progress Notes (Signed)
   12/02/23 1855  Vitals  Temp 98.4 F (36.9 C)  Pulse Rate 98  Resp 19  BP (!) 130/56  SpO2 96 %  O2 Device Nasal Cannula  Weight 64.6 kg  Type of Weight Post-Dialysis  Oxygen  Therapy  O2 Flow Rate (L/min) 1 L/min  Post Treatment  Dialyzer Clearance Lightly streaked  Hemodialysis Intake (mL) 0 mL  Liters Processed 50.4  Fluid Removed (mL) 2000 mL  Tolerated HD Treatment Yes   Received patient in bed to unit.  Alert and oriented.  Informed consent signed and in chart.   TX duration: 3  Patient tolerated well.  Transported back to the room  Alert, without acute distress.  Hand-off given to patient's nurse.   Access used: Yes Access issues: No  Total UF removed: 2000 Medication(s) given: See MAR Post HD VS: See Above Grid Post HD weight: 64.6 kg   [name]

## 2023-12-02 NOTE — Progress Notes (Signed)
 Physical Therapy Treatment Patient Details Name: Alexandra [name] MRN: [id number] DOB: 26-Jun-1940 Today's Date: 12/02/2023   History of Present Illness Pt is an 83 year old woman admitted on 11/29/23 with acute on chronic renal failure, hyperkalemia, SIRS. Pt started HD this admission. PMH: CKD 4, CHF, renal cancer with L nephrectomy, R hydronephrosis s/p stenting, COPD, HLD, osteoporosis, restless leg, urinary incontinence, UTIs, gout. Pt with recent fall resulting in R rib fractures.    PT Comments  Pt needed assist back to bed so she could be transported to HD. Moving pretty well and denies nausea. Expect she could have ambulated if not for her transport to HD. Expect she will make good progress as long as she tolerates HD. Recommend HHPT at dc.     If plan is discharge home, recommend the following: A little help with walking and/or transfers;A little help with bathing/dressing/bathroom;Assistance with cooking/housework;Assist for transportation;Direct supervision/assist for medications management   Can travel by private vehicle        Equipment Recommendations  None recommended by PT    Recommendations for Other Services       Precautions / Restrictions Precautions Precautions: Fall Recall of Precautions/Restrictions: Intact Restrictions Weight Bearing Restrictions Per Provider Order: No     Mobility  Bed Mobility Overal bed mobility: Needs Assistance Bed Mobility: Sit to Supine     Supine to sit: Min assist, HOB elevated Sit to supine: Supervision   General bed mobility comments: Assist to bring feet up into bed.    Transfers Overall transfer level: Needs assistance Equipment used: Rolling walker (2 wheels) Transfers: Sit to/from Stand, Bed to chair/wheelchair/BSC Sit to Stand: Contact guard assist   Step pivot transfers: Contact guard assist       General transfer comment: Assist for safety and lines    Ambulation/Gait               General Gait  Details: Deferred due to pt being transported to HD.   Stairs             Wheelchair Mobility     Tilt Bed    Modified Rankin (Stroke Patients Only)       Balance Overall balance assessment: Needs assistance   Sitting balance-Leahy Scale: Good     Standing balance support: No upper extremity supported, During functional activity Standing balance-Leahy Scale: Fair                              Hotel manager: No apparent difficulties  Cognition Arousal: Alert Behavior During Therapy: WFL for tasks assessed/performed   PT - Cognitive impairments: No apparent impairments                         Following commands: Intact      Cueing Cueing Techniques: Verbal cues  Exercises      General Comments General comments (skin integrity, edema, etc.): VSS on 1L      Pertinent Vitals/Pain Pain Assessment Pain Assessment: Faces Faces Pain Scale: Hurts even more Pain Location: R ribs Pain Descriptors / Indicators: Constant, Aching, Grimacing Pain Intervention(s): Monitored during session, Repositioned    Home Living Family/patient expects to be discharged to:: Private residence Living Arrangements: Spouse/significant other Available Help at Discharge: Family;Available 24 hours/day Type of Home: Mobile home Home Access: Ramped entrance       Home Layout: One level Home Equipment: Shower seat - built in;Toilet  riser;Rollator (4 wheels);Grab bars - tub/shower;Grab bars - toilet;Hand held shower head;Hospital bed;Electric scooter;Wheelchair - power;Wheelchair - manual      Prior Function            PT Goals (current goals can now be found in the care plan section) Acute Rehab PT Goals Patient Stated Goal: to get better PT Goal Formulation: With patient Time For Goal Achievement: 12/16/23 Potential to Achieve Goals: Good    Frequency    Min 3X/week      PT Plan      Co-evaluation               AM-PAC PT 6 Clicks Mobility   Outcome Measure  Help needed turning from your back to your side while in a flat bed without using bedrails?: A Little Help needed moving from lying on your back to sitting on the side of a flat bed without using bedrails?: A Little Help needed moving to and from a bed to a chair (including a wheelchair)?: A Little Help needed standing up from a chair using your arms (e.g., wheelchair or bedside chair)?: A Little Help needed to walk in hospital room?: A Little Help needed climbing 3-5 steps with a railing? : A Lot 6 Click Score: 17    End of Session Equipment Utilized During Treatment: Gait belt Activity Tolerance: Patient tolerated treatment well Patient left: in bed (transported to HD) Nurse Communication: Mobility status PT Visit Diagnosis: Other abnormalities of gait and mobility (R26.89)     Time: 8562-8550 PT Time Calculation (min) (ACUTE ONLY): 12 min  Charges:    $Therapeutic Activity: 8-22 mins PT General Charges $$ ACUTE PT VISIT: 1 Visit                     [facility] PT Acute Rehabilitation Services Office [phone]    Rodgers ORN [facility] 12/02/2023, 3:17 PM

## 2023-12-02 NOTE — Care Management Important Message (Signed)
 Important Message  Patient Details  Name: Alexandra [name] MRN: [id number] Date of Birth: September 27, 1940   Important Message Given:  Yes - Medicare IM     Jon Cruel 12/02/2023, 3:02 PM

## 2023-12-03 DIAGNOSIS — J410 Simple chronic bronchitis: Secondary | ICD-10-CM | POA: Diagnosis not present

## 2023-12-03 DIAGNOSIS — I509 Heart failure, unspecified: Secondary | ICD-10-CM

## 2023-12-03 DIAGNOSIS — E039 Hypothyroidism, unspecified: Secondary | ICD-10-CM

## 2023-12-03 DIAGNOSIS — I10 Essential (primary) hypertension: Secondary | ICD-10-CM

## 2023-12-03 DIAGNOSIS — Z85528 Personal history of other malignant neoplasm of kidney: Secondary | ICD-10-CM

## 2023-12-03 DIAGNOSIS — Z992 Dependence on renal dialysis: Secondary | ICD-10-CM

## 2023-12-03 DIAGNOSIS — K219 Gastro-esophageal reflux disease without esophagitis: Secondary | ICD-10-CM

## 2023-12-03 DIAGNOSIS — N186 End stage renal disease: Secondary | ICD-10-CM

## 2023-12-03 DIAGNOSIS — Z905 Acquired absence of kidney: Secondary | ICD-10-CM

## 2023-12-03 DIAGNOSIS — N185 Chronic kidney disease, stage 5: Secondary | ICD-10-CM | POA: Diagnosis not present

## 2023-12-03 DIAGNOSIS — J449 Chronic obstructive pulmonary disease, unspecified: Secondary | ICD-10-CM

## 2023-12-03 DIAGNOSIS — I5032 Chronic diastolic (congestive) heart failure: Secondary | ICD-10-CM | POA: Diagnosis not present

## 2023-12-03 DIAGNOSIS — N178 Other acute kidney failure: Secondary | ICD-10-CM | POA: Diagnosis not present

## 2023-12-03 LAB — CBC
HCT: 23.8 % — ABNORMAL LOW (ref 36.0–46.0)
Hemoglobin: 7.3 g/dL — ABNORMAL LOW (ref 12.0–15.0)
MCH: 30.7 pg (ref 26.0–34.0)
MCHC: 30.7 g/dL (ref 30.0–36.0)
MCV: 100 fL (ref 80.0–100.0)
Platelets: 330 10*3/uL (ref 150–400)
RBC: 2.38 MIL/uL — ABNORMAL LOW (ref 3.87–5.11)
RDW: 16.2 % — ABNORMAL HIGH (ref 11.5–15.5)
WBC: 16.5 10*3/uL — ABNORMAL HIGH (ref 4.0–10.5)
nRBC: 0.2 % (ref 0.0–0.2)

## 2023-12-03 LAB — RENAL FUNCTION PANEL
Albumin: 2.1 g/dL — ABNORMAL LOW (ref 3.5–5.0)
Anion gap: 9 (ref 5–15)
BUN: 30 mg/dL — ABNORMAL HIGH (ref 8–23)
CO2: 27 mmol/L (ref 22–32)
Calcium: 7.8 mg/dL — ABNORMAL LOW (ref 8.9–10.3)
Chloride: 100 mmol/L (ref 98–111)
Creatinine, Ser: 3.07 mg/dL — ABNORMAL HIGH (ref 0.44–1.00)
GFR, Estimated: 15 mL/min — ABNORMAL LOW (ref >60)
Glucose, Bld: 103 mg/dL — ABNORMAL HIGH (ref 70–99)
Phosphorus: 4.2 mg/dL (ref 2.5–4.6)
Potassium: 4.2 mmol/L (ref 3.5–5.1)
Sodium: 136 mmol/L (ref 135–145)

## 2023-12-03 LAB — HEPATITIS B SURFACE ANTIBODY, QUANTITATIVE: Hep B S AB Quant (Post): <3.5 m[IU]/mL — ABNORMAL LOW

## 2023-12-03 NOTE — Plan of Care (Signed)

## 2023-12-03 NOTE — Progress Notes (Signed)
 Patient ID: Alexandra [name], female   DOB: 07/29/1940, 83 y.o.   MRN: [id number] S: Tolerated HD well yesterday with 2 L UF.  No new complaints O:BP (!) 108/46 (BP Location: Right Arm)   Pulse 85   Temp 98.1 F (36.7 C) (Oral)   Resp (!) 22   Ht 5' 2 (1.575 m)   Wt 64.6 kg   SpO2 98%   BMI 26.05 kg/m   Intake/Output Summary (Last 24 hours) at 12/03/2023 1150 Last data filed at 12/02/2023 1909 Gross per 24 hour  Intake --  Output 4150 ml  Net -4150 ml   Intake/Output: I/O last 3 completed shifts: In: -  Out: 4350 [Urine:350; Other:4000]  Intake/Output this shift:  No intake/output data recorded. Weight change:  Gen: NAD CVS: RRR Resp:CTA Abd: +BS, soft, NT/ND Ext: no edema  Recent Labs  Lab 11/28/23 0839 11/29/23 1253 11/29/23 1942 11/30/23 0754 11/30/23 0755 12/01/23 0639 12/02/23 0552 12/03/23 0534  NA 137 136 139  --  139 137 136 136  K CANCELED 7.0* 6.0*  --  5.5* 4.4 5.7* 4.2  CL 105 107 107  --  108 103 105 100  CO2 10* 16* 16*  --  17* 23 18* 27  GLUCOSE 104* 95 141*  --  122* 127* 102* 103*  BUN 96* 104* 97*  --  95* 49* 56* 30*  CREATININE 7.09* 6.95* 6.28*  --  6.42* 3.90* 4.47* 3.07*  ALBUMIN  3.8 2.8* 2.6*  --  2.5*  --  2.0* 2.1*  CALCIUM  8.6* 8.3* 8.2* 8.0* 8.0* 7.7* 7.5* 7.8*  PHOS  --   --  5.8*  --  6.2*  --  4.9* 4.2  AST 13 14*  --   --   --   --   --   --   ALT 13 16  --   --   --   --   --   --    Liver Function Tests: Recent Labs  Lab 11/28/23 0839 11/29/23 1253 11/29/23 1942 11/30/23 0755 12/02/23 0552 12/03/23 0534  AST 13 14*  --   --   --   --   ALT 13 16  --   --   --   --   ALKPHOS 116 71  --   --   --   --   BILITOT 0.2 0.6  --   --   --   --   PROT 6.6 6.5  --   --   --   --   ALBUMIN  3.8 2.8*   < > 2.5* 2.0* 2.1*   < > = values in this interval not displayed.   No results for input(s): LIPASE, AMYLASE in the last 168 hours. No results for input(s): AMMONIA in the last 168 hours. CBC: Recent Labs  Lab  11/28/23 0839 11/29/23 1316 11/29/23 1316 11/30/23 0754 12/02/23 0552 12/03/23 0534  WBC 20.8* 15.9*   < > 15.2* 13.8* 16.5*  NEUTROABS 17.7* 13.8*  --   --   --   --   HGB 8.6* 8.1*   < > 7.4* 7.0* 7.3*  HCT 27.3* 26.8*   < > 23.8* 23.5* 23.8*  MCV 98* 101.9*  --  99.2 104.9* 100.0  PLT 503* 481*   < > 422* 362 330   < > = values in this interval not displayed.   Cardiac Enzymes: No results for input(s): CKTOTAL, CKMB, CKMBINDEX, TROPONINI in the last 168 hours. CBG: No results  for input(s): GLUCAP in the last 168 hours.  Iron Studies: No results for input(s): IRON, TIBC, TRANSFERRIN, FERRITIN in the last 72 hours. Studies/Results: No results found.  amLODipine   10 mg Oral q AM   amoxicillin   500 mg Oral Q24H   atorvastatin   40 mg Oral QHS   Chlorhexidine  Gluconate Cloth  6 each Topical Q0600   Chlorhexidine  Gluconate Cloth  6 each Topical Q0600   feeding supplement (NEPRO CARB STEADY)  237 mL Oral BID BM   ferrous sulfate   325 mg Oral q AM   heparin   5,000 Units Subcutaneous Q8H   hydrALAZINE   50 mg Oral TID   levothyroxine   100 mcg Oral Q0600   lidocaine   1 patch Transdermal Q24H   mupirocin ointment  1 Application Nasal BID   pantoprazole   40 mg Oral Daily   rOPINIRole   2 mg Oral BID    BMET    Component Value Date/Time   NA 136 12/03/2023 0534   NA 137 11/28/2023 0839   K 4.2 12/03/2023 0534   CL 100 12/03/2023 0534   CO2 27 12/03/2023 0534   GLUCOSE 103 (H) 12/03/2023 0534   BUN 30 (H) 12/03/2023 0534   BUN 96 (HH) 11/28/2023 0839   CREATININE 3.07 (H) 12/03/2023 0534   CREATININE 3.05 (HH) 06/13/2022 1021   CALCIUM  7.8 (L) 12/03/2023 0534   CALCIUM  8.0 (L) 11/30/2023 0754   GFRNONAA 15 (L) 12/03/2023 0534   GFRNONAA 15 (L) 06/13/2022 1021   GFRAA 29 (L) 03/28/2020 0753   CBC    Component Value Date/Time   WBC 16.5 (H) 12/03/2023 0534   RBC 2.38 (L) 12/03/2023 0534   HGB 7.3 (L) 12/03/2023 0534   HGB 8.6 (L) 11/28/2023 0839    HCT 23.8 (L) 12/03/2023 0534   HCT 27.3 (L) 11/28/2023 0839   PLT 330 12/03/2023 0534   PLT 503 (H) 11/28/2023 0839   MCV 100.0 12/03/2023 0534   MCV 98 (H) 11/28/2023 0839   MCH 30.7 12/03/2023 0534   MCHC 30.7 12/03/2023 0534   RDW 16.2 (H) 12/03/2023 0534   RDW 13.6 11/28/2023 0839   LYMPHSABS 1.0 11/29/2023 1316   LYMPHSABS 1.5 11/28/2023 0839   MONOABS 0.8 11/29/2023 1316   EOSABS 0.0 11/29/2023 1316   EOSABS 0.1 11/28/2023 0839   BASOSABS 0.0 11/29/2023 1316   BASOSABS 0.0 11/28/2023 0839     Assessment/Plan: 83 year old WF with diastolic heart failure and 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   I figured it was time  even though she is 2 she is not extremely debilitated.  Understands at least the time commitment and some issues related to incenter HD.  She did not really consider not doing it.  Given K of 7 and most likely uremic sxms , I think it was best to go ahead and start dialysis-  she is agreeable- line now placed and  first HD 6/21-  .   Had her second HD on Monday and tolerated it well.  Plan for 3rd session tomorrow.  Will consult [facility] for [facility] and AV access this week as well as to get renal navigator involved-  she lives in [place] and will need transportation to and from HD 2. Hypertension/volume  - BP was highish  and she is volume overloaded-  UF as able with HD-  also continue her home BP meds  3. Hyperkalemia-  got short term tx, then HD-  is resolved 4. Anemia  - hgb was in the 8's, now down to 7.  Transfuse per primary svc. Will check iron  ( was OK)  and add ESA -   hold in the setting of inc bladder mass-  observe for transfusion need 5. GU-  checked urinalysis and kidney imaging to make sure no issue there that might be reversible.  Now see that she has hydro - doesn't appear to  be new however ?  She is followed by [facility] in [place]-  gets stent changed-  also with this enlarging bladder mass which does appear to be new-  talked to GU-  they felt the appearance of hydro goes along with stent being in place-  they recommended placing foley-  she is making urine so really does not seem obstructed.  They did not recommend doing anything acutely for bladder mass  Fairy RONAL Sellar, MD [facility] [phone]

## 2023-12-03 NOTE — Consult Note (Addendum)
 Hospital Consult    Reason for Consult:  permanent HD access and [facility] Requesting Physician:  Dr. Rayburn MRN #:  [id number]  History of Present Illness: This is a 83 y.o. female with pertinent past medical history of CHF, COPD, HTN, HLD, Renal cell carcinoma with remote history of left nephrectomy, recurrent hydronephrosis s/p stenting, urinary incontinence, anemia, GERD, hypothyroidism, GERD and CKD stage V presenting with worsening renal function. She was seen at her PCP office with worsening creatinine of 7, baseline previously 3-4. She has been having some uremic symptoms so Nephrology has recommended initiation of dialysis . She has temporary line placed and started HD on 6/21.  Vascular surgery was consulted for permanent dialysis access and [facility] placement. She has not had prior access before. She does have history of a port in her right chest wall from chemo. No other central lines or pacemaker. She is right handed. She has no upper extremity limitations.   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 83 yo   BACK SURGERY  2016   lower L 4 to L 5   bladder tack and uterus lift  10/10/2015   CHOLECYSTECTOMY     summer of 2022   CYSTOSCOPY WITH RETROGRADE PYELOGRAM, URETEROSCOPY AND STENT PLACEMENT Right 08/04/2022   Procedure: CYSTOSCOPY WITH RIGHT URETERAL STENT EXCHANGE;  Surgeon: Lovie Arlyss CROME, MD;  Location: [facility] OR;  Service: Urology;  Laterality: Right;   IR RADIOLOGIST EVAL & MGMT  12/03/2018   IR RADIOLOGIST  EVAL & MGMT  12/31/2018   IR REMOVAL TUN ACCESS W/ PORT W/O FL MOD SED  08/08/2022   PARATHYROIDECTOMY Left 07/02/2016   Procedure: LEFT INFERIOR PARATHYROIDECTOMY;  Surgeon: Krystal Spinner, MD;  Location: [facility] ORS;  Service: General;  Laterality: Left;   SHOULDER OPEN ROTATOR CUFF REPAIR  12/2019   TEE WITHOUT CARDIOVERSION N/A 08/10/2022   Procedure: TRANSESOPHAGEAL ECHOCARDIOGRAM (TEE);  Surgeon: Mona Vinie BROCKS, MD;  Location: [facility] ENDOSCOPY;  Service: Cardiovascular;  Laterality: N/A;   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    Allergies  Allergen Reactions   Contrast Media [Iodinated Contrast Media] Hives, Itching and Rash   Omeprazole  Itching   Shellfish Allergy Nausea And Vomiting   Shellfish-Derived Products Nausea And Vomiting    Prior to Admission medications   Medication Sig Start Date End Date Taking? Authorizing Provider  acetaminophen  (TYLENOL ) 500 MG tablet Take 1,000 mg by mouth every 6 (six) hours as needed for mild pain (pain score 1-3) or moderate pain (pain score 4-6).   Yes [provider]  amLODipine  (NORVASC ) 10 MG tablet TAKE 1 TABLET(10 MG) BY MOUTH DAILY Patient taking differently: Take 10 mg by mouth in the morning. 01/11/23  Yes Cox, Kirsten, MD  atorvastatin  (LIPITOR) 40 MG tablet TAKE 1 TABLET BY MOUTH EVERY DAY Patient taking differently: Take 40 mg by mouth at bedtime. 10/13/23  Yes Cox, Kirsten, MD  Cholecalciferol (VITAMIN D) 125 MCG (5000 UT) CAPS Take 1 capsule by mouth in the morning.  Yes [provider]  cyanocobalamin  (VITAMIN B12) 1000 MCG/ML injection Inject 1 mL (1,000 mcg total) into the muscle every 14 (fourteen) days. 02/13/23  Yes Cox, Kirsten, MD  ferrous sulfate  325 (65 FE) MG tablet Take 1 tablet (325 mg total) by mouth daily. Patient taking differently: Take 325 mg by mouth in the morning. 11/11/23 02/09/24 Yes Teressa Harrie HERO, FNP  hydrALAZINE  (APRESOLINE ) 50 MG tablet Take 1 tablet (50 mg total) by mouth 3 (three) times daily.  11/18/23  Yes Teressa Harrie HERO, FNP  levothyroxine  (SYNTHROID ) 100 MCG tablet Take 1 tablet (100 mcg total) by mouth daily. Patient taking differently: Take 100 mcg by mouth daily before breakfast. 11/21/23 02/19/24 Yes Teressa Harrie HERO, FNP  nitroGLYCERIN  (NITROSTAT ) 0.4 MG SL tablet Place 1 tablet (0.4 mg total) under the tongue every 5 (five) minutes as needed for chest pain. 07/22/23  Yes Cox, Kirsten, MD  ondansetron  (ZOFRAN -ODT) 4 MG disintegrating tablet Take 1 tablet (4 mg total) by mouth every 8 (eight) hours as needed for nausea or vomiting. 09/06/23  Yes Sirivol, Mamatha, MD  oxyCODONE  (OXY IR/ROXICODONE ) 5 MG immediate release tablet Take 5 mg by mouth every 6 (six) hours as needed. 11/26/23  Yes [provider]  torsemide  (DEMADEX ) 20 MG tablet Take 1 tablet (20 mg total) by mouth daily as needed (edema). 09/17/22  Yes Cox, Kirsten, MD  VENTOLIN  HFA 108 (90 Base) MCG/ACT inhaler Inhale 1-2 puffs into the lungs every 6 (six) hours as needed for wheezing or shortness of breath. 07/31/22  Yes Cox, Kirsten, MD  rOPINIRole  (REQUIP ) 2 MG tablet TAKE 1 TABLET(2 MG) BY MOUTH TWICE DAILY Patient taking differently: Take 2 mg by mouth in the morning and at bedtime. 10/20/23   CoxAbigail, MD    Social History   Socioeconomic History   Marital status: Married    Spouse name: Lamar   Number of children: 2   Years of education: 11   Highest education level: 11th grade  Occupational History   Occupation: Retired  Tobacco Use   Smoking status: Former    Current packs/day: 0.00    Average packs/day: 1 pack/day for 50.0 years (50.0 ttl pk-yrs)    Types: Cigarettes    Start date: 06/12/1955    Quit date: 06/11/2005    Years since quitting: 18.4   Smokeless tobacco: Never  Vaping Use   Vaping status: Never Used  Substance and Sexual Activity   Alcohol use: No   Drug use: No   Sexual activity: Yes    Partners: Male  Other Topics Concern   Not on file  Social History Narrative   Not on file    Social Drivers of Health   Financial Resource Strain: Medium Risk (09/03/2023)   Overall Financial Resource Strain (CARDIA)    Difficulty of Paying Living Expenses: Somewhat hard  Food Insecurity: No Food Insecurity (11/30/2023)   Hunger Vital Sign    Worried About Running Out of Food in the Last Year: Never true    Ran Out of Food in the Last Year: Never true  Transportation Needs: No Transportation Needs (11/30/2023)   PRAPARE - Administrator, Civil Service (Medical): No    Lack of Transportation (Non-Medical): No  Physical Activity: Inactive (11/25/2023)   Exercise Vital Sign    Days of Exercise per Week: 0 days    Minutes of Exercise per Session: 0 min  Stress: Stress Concern Present (11/25/2023)   [employer] of Occupational Health -  Occupational Stress Questionnaire    Feeling of Stress: To some extent  Social Connections: Socially Isolated (11/30/2023)   Social Connection and Isolation Panel    Frequency of Communication with Friends and Family: Once a week    Frequency of Social Gatherings with Friends and Family: Once a week    Attends Religious Services: Never    Database administrator or Organizations: No    Attends Banker Meetings: Never    Marital Status: Married  Catering manager Violence: Not At Risk (11/30/2023)   Humiliation, Afraid, Rape, and Kick questionnaire    Fear of Current or Ex-Partner: No    Emotionally Abused: No    Physically Abused: No    Sexually Abused: No     Family History  Problem Relation Age of Onset   Emphysema Mother    Hyperlipidemia Sister    Hypertension Sister    COPD Sister    Hyperlipidemia Sister    Hypertension Sister    Lymphoma Sister    Obesity Brother    Hyperlipidemia Brother    Hypertension Brother    Breast cancer Neg Hx     ROS: Otherwise negative unless mentioned in HPI  Physical Examination  Vitals:   12/03/23 0827 12/03/23 1231  BP: (!) 108/46 (!) 125/58  Pulse: 85 81   Resp: (!) 22 (!) 24  Temp: 98.1 F (36.7 C) 98 F (36.7 C)  SpO2: 98% 99%   Body mass index is 26.05 kg/m.  General:  WDWN in NAD Gait: Not observed HENT: WNL, normocephalic Pulmonary: normal non-labored breathing, without  wheezing Cardiac: regular Vascular Exam/Pulses: 2+ radial pulses, hands warm and well perfused Extremities: without ischemic changes, without Gangrene , without cellulitis; without open wounds;  Musculoskeletal: no muscle wasting or atrophy  Neurologic: A&O X 3;  No focal weakness or paresthesias are detected; speech is fluent/normal Psychiatric:  The pt has Normal affect. Lymph:  Unremarkable  CBC    Component Value Date/Time   WBC 16.5 (H) 12/03/2023 0534   RBC 2.38 (L) 12/03/2023 0534   HGB 7.3 (L) 12/03/2023 0534   HGB 8.6 (L) 11/28/2023 0839   HCT 23.8 (L) 12/03/2023 0534   HCT 27.3 (L) 11/28/2023 0839   PLT 330 12/03/2023 0534   PLT 503 (H) 11/28/2023 0839   MCV 100.0 12/03/2023 0534   MCV 98 (H) 11/28/2023 0839   MCH 30.7 12/03/2023 0534   MCHC 30.7 12/03/2023 0534   RDW 16.2 (H) 12/03/2023 0534   RDW 13.6 11/28/2023 0839   LYMPHSABS 1.0 11/29/2023 1316   LYMPHSABS 1.5 11/28/2023 0839   MONOABS 0.8 11/29/2023 1316   EOSABS 0.0 11/29/2023 1316   EOSABS 0.1 11/28/2023 0839   BASOSABS 0.0 11/29/2023 1316   BASOSABS 0.0 11/28/2023 0839    BMET    Component Value Date/Time   NA 136 12/03/2023 0534   NA 137 11/28/2023 0839   K 4.2 12/03/2023 0534   CL 100 12/03/2023 0534   CO2 27 12/03/2023 0534   GLUCOSE 103 (H) 12/03/2023 0534   BUN 30 (H) 12/03/2023 0534   BUN 96 (HH) 11/28/2023 0839   CREATININE 3.07 (H) 12/03/2023 0534   CREATININE 3.05 (HH) 06/13/2022 1021   CALCIUM  7.8 (L) 12/03/2023 0534   CALCIUM  8.0 (L) 11/30/2023 0754   GFRNONAA 15 (L) 12/03/2023 0534   GFRNONAA 15 (L) 06/13/2022 1021   GFRAA 29 (L) 03/28/2020 0753    COAGS: Lab Results  Component Value Date   INR 1.1 08/04/2022  Non-Invasive Vascular  Imaging:   Bilateral upper extremity vein mapping pending   Statin:  Yes.   Beta Blocker:  No. Aspirin:  No. ACEI:  No. ARB:  No. CCB use:  Yes Other antiplatelets/anticoagulants:  No.    ASSESSMENT/PLAN: This is a 83 y.o. 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 She currently is dialyzing via a temporary catheter. She is right hand dominant. Bilateral upper extremity vein mapping has been ordered but is pending. I discussed with her AV fistula vs AVG. Risk, benefits, and alternatives to access surgery were discussed.   The patient is aware the risks include but are not limited to: bleeding, infection, steal syndrome, nerve damage, thrombosis, failure to mature, and need for additional procedures.  Plan will be for AVG vs AVF in left upper extremity pending results of venous duplex and we will also place TDC in OR.  Tentatively this could be scheduled for Thursday based on OR availability. The on call vascular surgeon, Dr. Sheree will see patient later this afternoon to provide further management plans.   Teretha Damme PA-C [facility] [phone] 12/03/2023  1:20 PM  I have independently interviewed and examined patient and agree with PA assessment and plan above.  No evidence of surface veins of the left upper extremity and as such will likely require left upper arm AV graft and she has strongly palpable left brachial and radial pulses.  Will also plan for tunneled dialysis catheter when she is in the OR.  We discussed the risk benefits alternatives and she demonstrates good understanding.  Will plan for surgical intervention tomorrow late morning or early afternoon pending or availability and she will need to be n.p.o. past midnight.  All questions were answered with the patient and her family at bedside.  Ananda Sitzer C. Sheree, MD [facility] [place] Office: [phone] Pager: [phone]

## 2023-12-03 NOTE — TOC CM/SW Note (Signed)
 CSW received message from Renal Navigator about patient and daughter request for assistance with transportation for outpatient HD. CSW spoke with daughter, Rock, to provide information on [facility] and answer questions. Information for [facility] placed on patient AVS.   Almarie Goodie, LCSW Clinical Social Worker [phone]

## 2023-12-03 NOTE — Progress Notes (Signed)
 Requested to see pt for out-pt HD needs at d/c. Met with pt at bedside and spoke to pt's daughter, Rock, via phone. Pt lives in [place], [place] in [place]. Pt requesting [facility] clinic. Referral submitted to [facility] admissions for review and to check clinic availability. Pt and pt's daughter requesting assistance/resources for transportation to/from HD at d/c. Contacted TOC staff to be made aware of pt and daughter's request for resources/assistance. Daughter also has medical questions and requesting to speak with a provider. Contacted attending with daughter's request to speak with a MD. Will follow and assist as needed.   Randine Mungo Renal Navigator [phone]

## 2023-12-03 NOTE — Progress Notes (Signed)
 PROGRESS NOTE        PATIENT DETAILS Name: Alexandra [name] Age: 83 y.o. Sex: female Date of Birth: [birth year] Admit Date: 11/29/2023 Admitting Physician Lebron JINNY Cage, MD PCP:Cox, Abigail, MD  Brief Summary: Patient is a 83 y.o.  female with history of CKD 4, HTN, chronic right-sided hydronephrosis-s/p stent (changed every 3 months), renal cell carcinoma-s/p left nephrectomy 1999-sent to the ED for abnormal labs with a creatinine of 7.09.  Significant events: 6/20>> admit to [facility]  Significant studies: 6/20> CXR: No pneumonia 6/20>> CT renal stone study: Slowly enlarging bladder mass-known bladder cancer-moderate to marked right hydronephrosis/hydroureter to the level of the bladder. 6/20>> renal ultrasound: Moderate right hydronephrosis/right hydroureter.  Significant microbiology data: 6/20>> blood culture: No growth  Procedures: 6/21>> nontunneled dialysis catheter by PCCM.  Consults: Nephrology  Subjective: No complaints-inquiring about discharge.  No nausea or vomiting.  Objective: Vitals: Blood pressure (!) 108/46, pulse 85, temperature 98.1 F (36.7 C), temperature source Oral, resp. rate (!) 22, height 5' 2 (1.575 m), weight 64.6 kg, SpO2 98%.   Exam: Awake/alert Chest: Clear to auscultation CVS: S1-S2 regular Abdomen: Soft nontender nondistended Extremities: No edema Nonfocal exam.  Pertinent Labs/Radiology:    Latest Ref Rng & Units 12/03/2023    5:34 AM 12/02/2023    5:52 AM 11/30/2023    7:54 AM  CBC  WBC 4.0 - 10.5 K/uL 16.5  13.8  15.2   Hemoglobin 12.0 - 15.0 g/dL 7.3  7.0  7.4   Hematocrit 36.0 - 46.0 % 23.8  23.5  23.8   Platelets 150 - 400 K/uL 330  362  422     Lab Results  Component Value Date   NA 136 12/03/2023   K 4.2 12/03/2023   CL 100 12/03/2023   CO2 27 12/03/2023      Assessment/Plan: AKI on CKD 4 (solitary right kidney-history of left nephrectomy in 1999) Unclear if this is AKI or progression  of underlying CKD to ESRD-suspect this is the latter at this point. No signs of renal recovery-started on hemodialysis per nephrology Await further recommendations from nephrology service.  Hyperkalemia Secondary to worsening renal function. Improved after hemodialysis x 2.  SIRS secondary to complicated UTI SIRS physiology improved but continues to have fluctuating levels of leukocytosis. Recent outpatient urine culture positive for Enterococcus-blood cultures negative this admission. Given issues with hydronephrosis/stent/bladder cancer-will plan to treat for 10 to 14 days-but narrow antibiotics to amoxicillin .  Normocytic anemia Likely due to combination of ESRD/acute illness.  No evidence of blood loss. Hb trending down-repeat CBC tomorrow Defer Aranesp /iron to nephrology service.  History of chronic right-sided hydronephrosis-s/p stent (changed every 3 months) Follows with Dr. 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   Disposition  Plan: Status is: Inpatient Remains inpatient appropriate because: Severity of illness   Planned Discharge Destination:Home   Diet: Diet Order             Diet renal with fluid restriction  Fluid restriction: 1500 mL Fluid; Room service appropriate? Yes; Fluid consistency: Thin  Diet effective now                     Antimicrobial agents: Anti-infectives (From admission, onward)    Start     Dose/Rate Route Frequency Ordered Stop   12/01/23 1800  amoxicillin  (AMOXIL ) capsule 500 mg        500 mg Oral Every 24 hours 12/01/23 1352 12/10/23 1959   12/01/23 1145  vancomycin  (VANCOREADY) IVPB 750 mg/150 mL        750 mg 150 mL/hr over 60 Minutes Intravenous  Once 12/01/23 1055 12/01/23 1247   11/30/23 1800  ceFEPIme  (MAXIPIME ) 1 g in sodium chloride  0.9 % 100 mL IVPB  Status:  Discontinued        1 g 200 mL/hr over 30 Minutes Intravenous Every 24 hours 11/29/23 1756 12/01/23 1301   11/29/23 1800  vancomycin  (VANCOREADY) IVPB 1250 mg/250 mL        1,250 mg 166.7 mL/hr over 90 Minutes Intravenous  Once 11/29/23 1756 11/29/23 2233   11/29/23 1800  ceFEPIme  (MAXIPIME ) 2 g in sodium chloride  0.9 % 100 mL IVPB        2 g 200 mL/hr over 30 Minutes Intravenous  Once 11/29/23 1756 11/29/23 1847   11/29/23 1755  vancomycin  variable dose per unstable renal function (pharmacist dosing)  Status:  Discontinued         Does not apply See admin instructions 11/29/23 1756 12/01/23 1301        MEDICATIONS: Scheduled Meds:  amLODipine   10 mg Oral q AM   amoxicillin   500 mg Oral Q24H   atorvastatin   40 mg Oral QHS   Chlorhexidine  Gluconate Cloth  6 each Topical Q0600   Chlorhexidine  Gluconate Cloth  6 each Topical Q0600   feeding supplement (NEPRO CARB STEADY)  237 mL Oral BID BM   ferrous sulfate   325 mg Oral q AM   heparin   5,000 Units Subcutaneous Q8H   hydrALAZINE   50 mg Oral TID   levothyroxine   100 mcg Oral Q0600   lidocaine   1 patch Transdermal Q24H   mupirocin ointment  1 Application Nasal BID   pantoprazole   40 mg Oral Daily   rOPINIRole   2 mg Oral BID   Continuous Infusions:   PRN Meds:.acetaminophen  **OR** acetaminophen , heparin , HYDROcodone -acetaminophen ,  ipratropium-albuterol , melatonin, ondansetron  **OR** ondansetron  (ZOFRAN ) IV   I have personally reviewed following labs and imaging studies  LABORATORY DATA: CBC: Recent Labs  Lab 11/28/23 0839 11/29/23 1316 11/30/23 0754 12/02/23 0552 12/03/23 0534  WBC 20.8* 15.9* 15.2* 13.8* 16.5*  NEUTROABS 17.7* 13.8*  --   --   --   HGB 8.6* 8.1* 7.4* 7.0* 7.3*  HCT 27.3* 26.8* 23.8* 23.5* 23.8*  MCV 98* 101.9* 99.2 104.9* 100.0  PLT 503* 481* 422* 362 330    Basic Metabolic Panel: Recent Labs  Lab 11/29/23 1942 11/29/23 1951 11/30/23 0754 11/30/23 0755 12/01/23 0639 12/02/23 0552 12/03/23 0534  NA 139  --   --  139 137 136 136  K 6.0*  --   --  5.5* 4.4 5.7* 4.2  CL 107  --   --  108 103 105 100  CO2 16*  --   --  17* 23 18* 27  GLUCOSE 141*  --   --  122* 127* 102* 103*  BUN 97*  --   --  95* 49* 56* 30*  CREATININE 6.28*  --   --  6.42* 3.90* 4.47* 3.07*  CALCIUM  8.2*  --  8.0* 8.0* 7.7* 7.5* 7.8*  MG  --  1.4*  --   --   --   --   --   PHOS 5.8*  --   --  6.2*  --  4.9* 4.2    GFR: Estimated Creatinine Clearance: 12.9 mL/min (A) (by C-G formula based on SCr of 3.07 mg/dL (H)).  Liver Function Tests: Recent Labs  Lab 11/28/23 0839 11/29/23 1253 11/29/23 1942 11/30/23 0755 12/02/23 0552 12/03/23 0534  AST 13 14*  --   --   --   --   ALT 13 16  --   --   --   --   ALKPHOS 116 71  --   --   --   --   BILITOT 0.2 0.6  --   --   --   --   PROT 6.6 6.5  --   --   --   --   ALBUMIN  3.8 2.8* 2.6* 2.5* 2.0* 2.1*   No results for input(s): LIPASE, AMYLASE in the last 168 hours. No results for input(s): AMMONIA in the last 168 hours.  Coagulation Profile: No results for input(s): INR, PROTIME in the last 168 hours.  Cardiac Enzymes: No results for input(s): CKTOTAL, CKMB, CKMBINDEX, TROPONINI in the last 168 hours.  BNP (last 3 results) No results for input(s): PROBNP in the last 8760 hours.  Lipid Profile: No results for input(s): CHOL,  HDL, LDLCALC, TRIG, CHOLHDL, LDLDIRECT in the last 72 hours.  Thyroid  Function Tests: No results for input(s): TSH, T4TOTAL, FREET4, T3FREE, THYROIDAB in the last 72 hours.  Anemia Panel: No results for input(s): VITAMINB12, FOLATE, FERRITIN, TIBC, IRON, RETICCTPCT in the last 72 hours.   Urine analysis:    Component Value Date/Time   COLORURINE YELLOW 08/04/2022 0257   APPEARANCEUR HAZY (A) 08/04/2022 0257   LABSPEC 1.010 08/04/2022 0257   PHURINE 5.0 08/04/2022 0257   GLUCOSEU NEGATIVE 08/04/2022 0257   HGBUR MODERATE (A) 08/04/2022 0257   BILIRUBINUR negative 11/28/2023 0923   KETONESUR negative 11/28/2023 0923   KETONESUR NEGATIVE 08/04/2022 0257   PROTEINUR 100 (A) 08/04/2022 0257   UROBILINOGEN 0.2 11/28/2023 0923   NITRITE Negative 11/28/2023 0923   NITRITE NEGATIVE 08/04/2022 0257   LEUKOCYTESUR Large (3+) (A) 11/28/2023 0923   LEUKOCYTESUR LARGE (A) 08/04/2022 0257    Sepsis Labs: Lactic Acid, Venous    Component Value Date/Time   LATICACIDVEN 0.6 08/04/2022 0940    MICROBIOLOGY: Recent Results (from the past 240 hours)  Urine Culture     Status: None   Collection Time: 11/28/23  9:59 AM   Specimen: Urine   UR  Result Value Ref Range Status   Urine Culture, Routine Final report  Final   Organism ID, Bacteria Comment  Final    Comment: Mixed urogenital flora Less than 10,000 colonies/mL   MRSA Next Gen by PCR, Nasal     Status: Abnormal   Collection Time: 11/29/23  5:48 PM   Specimen: Nasal Mucosa; Nasal Swab  Result Value Ref Range Status   MRSA by PCR Next Gen DETECTED (A) NOT DETECTED Final    Comment: RESULT CALLED TO, READ BACK BY AND VERIFIED WITH: RN LOIS DUKE [phone] @  2235 FH (NOTE) The GeneXpert MRSA Assay (FDA approved for NASAL specimens only), is one component of a comprehensive MRSA colonization surveillance program. It is not intended to diagnose MRSA infection nor to guide or monitor treatment for MRSA  infections. Test performance is not FDA approved in patients less than 17 years old. Performed at [facility], [address] [place], [place] [address]   Culture, blood (Routine X 2) w Reflex to ID Panel     Status: None (Preliminary result)   Collection Time: 11/29/23  7:42 PM   Specimen: BLOOD RIGHT HAND  Result Value Ref Range Status   Specimen Description BLOOD RIGHT HAND  Final   Special Requests   Final    BOTTLES DRAWN AEROBIC ONLY Blood Culture adequate volume   Culture   Final    NO GROWTH 4 DAYS Performed at [facility], [address], [place], [place] [address]    Report Status PENDING  Incomplete  Culture, blood (Routine X 2) w Reflex to ID Panel     Status: None (Preliminary result)   Collection Time: 11/29/23  9:24 PM   Specimen: BLOOD RIGHT HAND  Result Value Ref Range Status   Specimen Description BLOOD RIGHT HAND  Final   Special Requests   Final    BOTTLES DRAWN AEROBIC AND ANAEROBIC Blood Culture results may not be optimal due to an inadequate volume of blood received in culture bottles   Culture   Final    NO GROWTH 4 DAYS Performed at [facility], [address] [place], [place] [address]    Report Status PENDING  Incomplete    RADIOLOGY STUDIES/RESULTS: No results found.    LOS: 4 days   Donalda Applebaum, MD  [facility]    To contact the attending provider between 7A-7P or the covering provider during after hours 7P-7A, please log into the web site [email or web address] and access using universal [place] password for that web site. If you do not have the password, please call the hospital operator.  12/03/2023, 10:31 AM

## 2023-12-04 ENCOUNTER — Encounter (HOSPITAL_COMMUNITY)
Admission: EM | Disposition: A | Discharge status: Home / Self Care | Payer: Self-pay | Source: Home / Self Care | Attending: Internal Medicine

## 2023-12-04 ENCOUNTER — Encounter (HOSPITAL_COMMUNITY): Payer: Self-pay | Admitting: Internal Medicine

## 2023-12-04 ENCOUNTER — Encounter (HOSPITAL_COMMUNITY)

## 2023-12-04 ENCOUNTER — Inpatient Hospital Stay (HOSPITAL_COMMUNITY)

## 2023-12-04 ENCOUNTER — Inpatient Hospital Stay (HOSPITAL_COMMUNITY): Payer: Self-pay | Admitting: Anesthesiology

## 2023-12-04 ENCOUNTER — Other Ambulatory Visit: Payer: Self-pay

## 2023-12-04 DIAGNOSIS — I509 Heart failure, unspecified: Secondary | ICD-10-CM

## 2023-12-04 DIAGNOSIS — I132 Hypertensive heart and chronic kidney disease with heart failure and with stage 5 chronic kidney disease, or end stage renal disease: Secondary | ICD-10-CM | POA: Diagnosis not present

## 2023-12-04 DIAGNOSIS — J410 Simple chronic bronchitis: Secondary | ICD-10-CM | POA: Diagnosis not present

## 2023-12-04 DIAGNOSIS — N186 End stage renal disease: Secondary | ICD-10-CM

## 2023-12-04 DIAGNOSIS — N185 Chronic kidney disease, stage 5: Secondary | ICD-10-CM | POA: Diagnosis not present

## 2023-12-04 DIAGNOSIS — Z992 Dependence on renal dialysis: Secondary | ICD-10-CM

## 2023-12-04 DIAGNOSIS — N178 Other acute kidney failure: Secondary | ICD-10-CM | POA: Diagnosis not present

## 2023-12-04 DIAGNOSIS — I5032 Chronic diastolic (congestive) heart failure: Secondary | ICD-10-CM | POA: Diagnosis not present

## 2023-12-04 HISTORY — PX: INSERTION OF DIALYSIS CATHETER: SHX1324

## 2023-12-04 HISTORY — PX: AV FISTULA PLACEMENT: SHX1204

## 2023-12-04 LAB — CBC
HCT: 23.3 % — ABNORMAL LOW (ref 36.0–46.0)
HCT: 28.9 % — ABNORMAL LOW (ref 36.0–46.0)
Hemoglobin: 7.1 g/dL — ABNORMAL LOW (ref 12.0–15.0)
Hemoglobin: 8.8 g/dL — ABNORMAL LOW (ref 12.0–15.0)
MCH: 31 pg (ref 26.0–34.0)
MCH: 30.2 pg (ref 26.0–34.0)
MCHC: 30.5 g/dL (ref 30.0–36.0)
MCHC: 30.4 g/dL (ref 30.0–36.0)
MCV: 101.7 fL — ABNORMAL HIGH (ref 80.0–100.0)
MCV: 99.3 fL (ref 80.0–100.0)
Platelets: 252 10*3/uL (ref 150–400)
Platelets: 340 10*3/uL (ref 150–400)
RBC: 2.29 MIL/uL — ABNORMAL LOW (ref 3.87–5.11)
RBC: 2.91 MIL/uL — ABNORMAL LOW (ref 3.87–5.11)
RDW: 16 % — ABNORMAL HIGH (ref 11.5–15.5)
RDW: 17.4 % — ABNORMAL HIGH (ref 11.5–15.5)
WBC: 19.2 10*3/uL — ABNORMAL HIGH (ref 4.0–10.5)
WBC: 17.8 10*3/uL — ABNORMAL HIGH (ref 4.0–10.5)
nRBC: 0.7 % — ABNORMAL HIGH (ref 0.0–0.2)
nRBC: 0.4 % — ABNORMAL HIGH (ref 0.0–0.2)

## 2023-12-04 LAB — CULTURE, BLOOD (ROUTINE X 2)
Culture: NO GROWTH
Culture: NO GROWTH
Special Requests: ADEQUATE

## 2023-12-04 LAB — RENAL FUNCTION PANEL
Albumin: 2.1 g/dL — ABNORMAL LOW (ref 3.5–5.0)
Anion gap: 13 (ref 5–15)
BUN: 56 mg/dL — ABNORMAL HIGH (ref 8–23)
CO2: 27 mmol/L (ref 22–32)
Calcium: 7.7 mg/dL — ABNORMAL LOW (ref 8.9–10.3)
Chloride: 97 mmol/L — ABNORMAL LOW (ref 98–111)
Creatinine, Ser: 4.04 mg/dL — ABNORMAL HIGH (ref 0.44–1.00)
GFR, Estimated: 11 mL/min — ABNORMAL LOW (ref >60)
Glucose, Bld: 103 mg/dL — ABNORMAL HIGH (ref 70–99)
Phosphorus: 4.2 mg/dL (ref 2.5–4.6)
Potassium: 4.5 mmol/L (ref 3.5–5.1)
Sodium: 137 mmol/L (ref 135–145)

## 2023-12-04 LAB — PREPARE RBC (CROSSMATCH)

## 2023-12-04 SURGERY — INSERTION OF DIALYSIS CATHETER
Anesthesia: General | Site: Arm Upper

## 2023-12-04 MED ORDER — LIDOCAINE 2% (20 MG/ML) 5 ML SYRINGE
INTRAMUSCULAR | Status: DC | PRN
  Administered 2023-12-04: 100 mg via INTRAVENOUS

## 2023-12-04 MED ORDER — HEPARIN 6000 UNIT IRRIGATION SOLUTION
Status: DC | PRN
  Administered 2023-12-04: 1

## 2023-12-04 MED ORDER — PROPOFOL 10 MG/ML IV BOLUS
INTRAVENOUS | Status: AC
  Filled 2023-12-04: qty 20

## 2023-12-04 MED ORDER — FENTANYL CITRATE (PF) 250 MCG/5ML IJ SOLN
INTRAMUSCULAR | Status: AC
  Filled 2023-12-04: qty 5

## 2023-12-04 MED ORDER — CEFAZOLIN SODIUM-DEXTROSE 2-4 GM/100ML-% IV SOLN
INTRAVENOUS | Status: AC
  Filled 2023-12-04: qty 100

## 2023-12-04 MED ORDER — DEXAMETHASONE SODIUM PHOSPHATE 10 MG/ML IJ SOLN
INTRAMUSCULAR | Status: DC | PRN
  Administered 2023-12-04: 4 mg via INTRAVENOUS

## 2023-12-04 MED ORDER — OXYCODONE HCL 5 MG/5ML PO SOLN: 5.0000 mg | Freq: Once | ORAL | Status: DC | PRN

## 2023-12-04 MED ORDER — ONDANSETRON HCL 4 MG/2ML IJ SOLN
INTRAMUSCULAR | Status: DC | PRN
  Administered 2023-12-04: 4 mg via INTRAVENOUS

## 2023-12-04 MED ORDER — OXYCODONE HCL 5 MG PO TABS: 5.0000 mg | ORAL_TABLET | Freq: Once | ORAL | Status: DC | PRN

## 2023-12-04 MED ORDER — ACETAMINOPHEN 500 MG PO TABS
1000.0000 mg | ORAL_TABLET | Freq: Once | ORAL | Status: AC
  Administered 2023-12-04: 1000 mg via ORAL
  Filled 2023-12-04: qty 2

## 2023-12-04 MED ORDER — FENTANYL CITRATE (PF) 100 MCG/2ML IJ SOLN
25.0000 ug | INTRAMUSCULAR | Status: DC | PRN
  Administered 2023-12-04: 25 ug via INTRAVENOUS

## 2023-12-04 MED ORDER — HYDROCODONE-ACETAMINOPHEN 7.5-325 MG PO TABS
1.0000 | ORAL_TABLET | ORAL | Status: DC | PRN
  Administered 2023-12-04: 1 via ORAL
  Filled 2023-12-04: qty 1

## 2023-12-04 MED ORDER — MIDAZOLAM HCL 2 MG/2ML IJ SOLN: 0.5000 mg | Freq: Once | INTRAMUSCULAR | Status: DC | PRN

## 2023-12-04 MED ORDER — PROPOFOL 10 MG/ML IV BOLUS
INTRAVENOUS | Status: DC | PRN
  Administered 2023-12-04: 90 mg via INTRAVENOUS

## 2023-12-04 MED ORDER — CHLORHEXIDINE GLUCONATE 0.12 % MT SOLN
15.0000 mL | Freq: Once | OROMUCOSAL | Status: AC
  Administered 2023-12-04: 15 mL via OROMUCOSAL
  Filled 2023-12-04: qty 15

## 2023-12-04 MED ORDER — PROPOFOL 1000 MG/100ML IV EMUL
INTRAVENOUS | Status: AC
  Filled 2023-12-04: qty 100

## 2023-12-04 MED ORDER — 0.9 % SODIUM CHLORIDE (POUR BTL) OPTIME
TOPICAL | Status: DC | PRN
  Administered 2023-12-04: 1000 mL

## 2023-12-04 MED ORDER — ATROPINE SULFATE 1 MG/10ML IJ SOSY
PREFILLED_SYRINGE | INTRAMUSCULAR | Status: DC | PRN
  Administered 2023-12-04: .2 mg via INTRAVENOUS

## 2023-12-04 MED ORDER — HEPARIN SODIUM (PORCINE) 1000 UNIT/ML IJ SOLN
INTRAMUSCULAR | Status: AC
  Filled 2023-12-04: qty 10

## 2023-12-04 MED ORDER — SURGIFLO WITH THROMBIN (HEMOSTATIC MATRIX KIT) OPTIME
TOPICAL | Status: DC | PRN
  Administered 2023-12-04: 1 via TOPICAL

## 2023-12-04 MED ORDER — ORAL CARE MOUTH RINSE: 15.0000 mL | Freq: Once | OROMUCOSAL | Status: AC

## 2023-12-04 MED ORDER — FENTANYL CITRATE (PF) 100 MCG/2ML IJ SOLN
INTRAMUSCULAR | Status: AC
Start: 2023-12-04 — End: 2023-12-04
  Filled 2023-12-04: qty 2

## 2023-12-04 MED ORDER — LIDOCAINE-EPINEPHRINE (PF) 1 %-1:200000 IJ SOLN
INTRAMUSCULAR | Status: AC
Start: 2023-12-04 — End: 2023-12-04
  Filled 2023-12-04: qty 30

## 2023-12-04 MED ORDER — SODIUM CHLORIDE 0.9 % IV SOLN: INTRAVENOUS | Status: DC

## 2023-12-04 MED ORDER — CEFAZOLIN SODIUM-DEXTROSE 2-3 GM-%(50ML) IV SOLR
INTRAVENOUS | Status: DC | PRN
  Administered 2023-12-04: 2 g via INTRAVENOUS

## 2023-12-04 MED ORDER — FENTANYL CITRATE (PF) 250 MCG/5ML IJ SOLN
INTRAMUSCULAR | Status: DC | PRN
  Administered 2023-12-04 (×6): 25 ug via INTRAVENOUS

## 2023-12-04 MED ORDER — HEPARIN 6000 UNIT IRRIGATION SOLUTION
Status: AC
Start: 2023-12-04 — End: 2023-12-04
  Filled 2023-12-04: qty 500

## 2023-12-04 MED ORDER — SODIUM CHLORIDE 0.9% IV SOLUTION: Freq: Once | INTRAVENOUS | Status: AC

## 2023-12-04 MED ORDER — HEPARIN SODIUM (PORCINE) 1000 UNIT/ML IJ SOLN
INTRAMUSCULAR | Status: DC | PRN
  Administered 2023-12-04: 3800 [IU] via INTRAVENOUS

## 2023-12-04 MED ORDER — PHENYLEPHRINE HCL-NACL 20-0.9 MG/250ML-% IV SOLN
INTRAVENOUS | Status: DC | PRN
  Administered 2023-12-04: 30 ug/min via INTRAVENOUS

## 2023-12-04 MED ORDER — LIDOCAINE 2% (20 MG/ML) 5 ML SYRINGE
INTRAMUSCULAR | Status: AC
  Filled 2023-12-04: qty 5

## 2023-12-04 SURGICAL SUPPLY — 46 items
ARMBAND PINK RESTRICT EXTREMIT (MISCELLANEOUS) ×6 IMPLANT
BAG COUNTER SPONGE SURGICOUNT (BAG) ×3 IMPLANT
BAG DECANTER FOR FLEXI CONT (MISCELLANEOUS) ×3 IMPLANT
BIOPATCH RED 1 DISK 7.0 (GAUZE/BANDAGES/DRESSINGS) ×3 IMPLANT
CANISTER SUCTION 3000ML PPV (SUCTIONS) ×3 IMPLANT
CATH PALINDROME-SP 14.5FX23 (CATHETERS) ×1 IMPLANT
CLIP TI MEDIUM 6 (CLIP) ×3 IMPLANT
CLIP TI WIDE RED SMALL 6 (CLIP) ×3 IMPLANT
COVER DOME SNAP 22 D (MISCELLANEOUS) ×1 IMPLANT
COVER PROBE W GEL 5X96 (DRAPES) ×3 IMPLANT
COVER SURGICAL LIGHT HANDLE (MISCELLANEOUS) ×3 IMPLANT
DERMABOND ADVANCED .7 DNX12 (GAUZE/BANDAGES/DRESSINGS) ×3 IMPLANT
DRSG COVADERM 4X6 (GAUZE/BANDAGES/DRESSINGS) ×2 IMPLANT
ELECTRODE REM PT RTRN 9FT ADLT (ELECTROSURGICAL) ×3 IMPLANT
GAUZE 4X4 16PLY ~~LOC~~+RFID DBL (SPONGE) ×3 IMPLANT
GAUZE SPONGE 4X4 12PLY STRL (GAUZE/BANDAGES/DRESSINGS) ×1 IMPLANT
GLOVE SURG SS PI 7.5 STRL IVOR (GLOVE) ×9 IMPLANT
GOWN STRL REUS W/ TWL LRG LVL3 (GOWN DISPOSABLE) ×6 IMPLANT
GOWN STRL REUS W/ TWL XL LVL3 (GOWN DISPOSABLE) ×3 IMPLANT
KIT BASIN OR (CUSTOM PROCEDURE TRAY) ×3 IMPLANT
KIT TURNOVER KIT B (KITS) ×3 IMPLANT
NDL 18GX1X1/2 (RX/OR ONLY) (NEEDLE) ×2 IMPLANT
NDL HYPO 25GX1X1/2 BEV (NEEDLE) ×2 IMPLANT
NEEDLE 18GX1X1/2 (RX/OR ONLY) (NEEDLE) ×3 IMPLANT
NEEDLE HYPO 25GX1X1/2 BEV (NEEDLE) ×3 IMPLANT
NS IRRIG 1000ML POUR BTL (IV SOLUTION) ×3 IMPLANT
PACK CV ACCESS (CUSTOM PROCEDURE TRAY) ×3 IMPLANT
PACK SRG BSC III STRL LF ECLPS (CUSTOM PROCEDURE TRAY) ×3 IMPLANT
PAD ARMBOARD POSITIONER FOAM (MISCELLANEOUS) ×6 IMPLANT
SET MICROPUNCTURE 5F STIFF (MISCELLANEOUS) ×1 IMPLANT
SLING ARM FOAM STRAP LRG (SOFTGOODS) IMPLANT
SURGIFLO W/THROMBIN 8M KIT (HEMOSTASIS) ×1 IMPLANT
SUT ETHILON 3 0 PS 1 (SUTURE) ×3 IMPLANT
SUT PROLENE 6 0 BV (SUTURE) ×1 IMPLANT
SUT PROLENE 6 0 CC (SUTURE) ×3 IMPLANT
SUT VIC AB 3-0 SH 27X BRD (SUTURE) ×3 IMPLANT
SUT VIC AB 4-0 PS2 18 (SUTURE) ×3 IMPLANT
SYR 10ML LL (SYRINGE) ×3 IMPLANT
SYR 20ML LL LF (SYRINGE) ×6 IMPLANT
SYR 5ML LL (SYRINGE) ×3 IMPLANT
SYR CONTROL 10ML LL (SYRINGE) ×3 IMPLANT
TOWEL GREEN STERILE (TOWEL DISPOSABLE) ×6 IMPLANT
TOWEL GREEN STERILE FF (TOWEL DISPOSABLE) ×3 IMPLANT
UNDERPAD 30X36 HEAVY ABSORB (UNDERPADS AND DIAPERS) ×3 IMPLANT
WATER STERILE IRR 1000ML POUR (IV SOLUTION) ×3 IMPLANT
WIRE BENTSON .035X145CM (WIRE) ×1 IMPLANT

## 2023-12-04 NOTE — Progress Notes (Signed)
 PROGRESS NOTE        PATIENT DETAILS Name: Alexandra [name] Age: 83 y.o. Sex: female Date of Birth: 04/19/1941 Admit Date: 11/29/2023 Admitting Physician Lebron JINNY Cage, MD PCP:Cox, Abigail, MD  Brief Summary: Patient is a 83 y.o.  female with history of CKD 4, HTN, chronic right-sided hydronephrosis-s/p stent (changed every 3 months), renal cell carcinoma-s/p left nephrectomy 1999-sent to the ED for abnormal labs with a creatinine of 7.09.  Significant events: 6/20>> admit to [facility]  Significant studies: 6/20> CXR: No pneumonia 6/20>> CT renal stone study: Slowly enlarging bladder mass-known bladder cancer-moderate to marked right hydronephrosis/hydroureter to the level of the bladder. 6/20>> renal ultrasound: Moderate right hydronephrosis/right hydroureter.  Significant microbiology data: 6/20>> blood culture: No growth  Procedures: 6/21>> nontunneled dialysis catheter by PCCM.  Consults: Nephrology  Subjective: No chest pain-no shortness of breath-lying comfortably in bed.  Objective: Vitals: Blood pressure (!) 130/53, pulse 88, temperature 98.3 F (36.8 C), temperature source Oral, resp. rate 18, height 5' 2 (1.575 m), weight 64.6 kg, SpO2 98%.   Exam: Awake/alert Chest: Clear to auscultation CVS: S1-S2 regular Abdomen: Soft nontender nondistended Extremities: No edema  Pertinent Labs/Radiology:    Latest Ref Rng & Units 12/04/2023    3:28 AM 12/03/2023    5:34 AM 12/02/2023    5:52 AM  CBC  WBC 4.0 - 10.5 K/uL 19.2  16.5  13.8   Hemoglobin 12.0 - 15.0 g/dL 7.1  7.3  7.0   Hematocrit 36.0 - 46.0 % 23.3  23.8  23.5   Platelets 150 - 400 K/uL 252  330  362     Lab Results  Component Value Date   NA 137 12/04/2023   K 4.5 12/04/2023   CL 97 (L) 12/04/2023   CO2 27 12/04/2023     Assessment/Plan: AKI on CKD 4 (solitary right kidney-history of left nephrectomy in 1999)-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   Hyperkalemia Secondary to worsening renal function. Improved after hemodialysis x 2.  SIRS secondary to complicated UTI SIRS physiology improved but continues to have fluctuating levels of leukocytosis. Recent outpatient urine culture positive for Enterococcus-blood cultures negative this admission. Given issues with hydronephrosis/stent/bladder cancer-will plan to treat for 10 to 14 days-antibiotics has been narrowed to just amoxicillin .  Normocytic anemia Likely due to combination of ESRD/acute illness.  No evidence of blood loss. Hb trending down-repeat CBC tomorrow Defer Aranesp /iron to nephrology service.  History of chronic right-sided hydronephrosis-s/p stent (changed every 3 months) Follows with Dr. Chao-urology in [place]  History of bladder cancer Follows with urology-per patient-this is being observed  History of renal cell carcinoma-s/p left nephrectomy in 1999  COPD Not in exacerbation Bronchodilators  Recent right-sided rib fracture Supportive care Incentive spirometry/flutter valve  Chronic HFmrEF Euvolemic Volume removal with HD  HTN Stable Continue amlodipine   HLD Statin  GERD PPI  Restless leg syndrome Requip   Hypothyroidism Synthroid  TSH February 2025 was stable  Code status:   Code Status: Limited: Do not attempt resuscitation (DNR) -DNR-LIMITED -Do Not Intubate/DNI    DVT Prophylaxis: heparin  injection 5,000 Units Start: 11/29/23 2200   Family Communication: Daughter-Lidna Dorlene [phone] updated over the phone   Disposition Plan: Status is: Inpatient Remains inpatient appropriate because: Severity of illness  Planned Discharge Destination:Home   Diet: Diet Order             Diet NPO time specified  Diet effective midnight                      Antimicrobial agents: Anti-infectives (From admission, onward)    Start     Dose/Rate Route Frequency Ordered Stop   12/01/23 1800  amoxicillin  (AMOXIL ) capsule 500 mg        500 mg Oral Every 24 hours 12/01/23 1352 12/10/23 1959   12/01/23 1145  vancomycin  (VANCOREADY) IVPB 750 mg/150 mL        750 mg 150 mL/hr over 60 Minutes Intravenous  Once 12/01/23 1055 12/01/23 1247   11/30/23 1800  ceFEPIme  (MAXIPIME ) 1 g in sodium chloride  0.9 % 100 mL IVPB  Status:  Discontinued        1 g 200 mL/hr over 30 Minutes Intravenous Every 24 hours 11/29/23 1756 12/01/23 1301   11/29/23 1800  vancomycin  (VANCOREADY) IVPB 1250 mg/250 mL        1,250 mg 166.7 mL/hr over 90 Minutes Intravenous  Once 11/29/23 1756 11/29/23 2233   11/29/23 1800  ceFEPIme  (MAXIPIME ) 2 g in sodium chloride  0.9 % 100 mL IVPB        2 g 200 mL/hr over 30 Minutes Intravenous  Once 11/29/23 1756 11/29/23 1847   11/29/23 1755  vancomycin  variable dose per unstable renal function (pharmacist dosing)  Status:  Discontinued         Does not apply See admin instructions 11/29/23 1756 12/01/23 1301        MEDICATIONS: Scheduled Meds:  acetaminophen   1,000 mg Oral Once   amLODipine   10 mg Oral q AM   amoxicillin   500 mg Oral Q24H   atorvastatin   40 mg Oral QHS   Chlorhexidine  Gluconate Cloth  6 each Topical Q0600   Chlorhexidine  Gluconate Cloth  6 each Topical Q0600   feeding supplement (NEPRO CARB STEADY)  237 mL Oral BID BM   ferrous sulfate   325 mg Oral q AM   heparin   5,000 Units Subcutaneous Q8H   hydrALAZINE   50 mg Oral TID   levothyroxine   100 mcg Oral Q0600   lidocaine   1 patch Transdermal Q24H   mupirocin ointment  1 Application Nasal BID   pantoprazole   40 mg Oral Daily   rOPINIRole   2 mg Oral BID   Continuous Infusions:   PRN Meds:.acetaminophen  **OR** acetaminophen , heparin , HYDROcodone -acetaminophen , ipratropium-albuterol , melatonin, ondansetron  **OR** ondansetron  (ZOFRAN ) IV   I have personally  reviewed following labs and imaging studies  LABORATORY DATA: CBC: Recent Labs  Lab 11/28/23 0839 11/29/23 1316 11/30/23 0754 12/02/23 0552 12/03/23 0534 12/04/23 0328  WBC 20.8* 15.9* 15.2* 13.8* 16.5* 19.2*  NEUTROABS 17.7* 13.8*  --   --   --   --   HGB 8.6* 8.1* 7.4* 7.0* 7.3* 7.1*  HCT 27.3* 26.8* 23.8* 23.5* 23.8* 23.3*  MCV 98* 101.9* 99.2 104.9* 100.0 101.7*  PLT 503* 481* 422* 362 330 252    Basic Metabolic Panel: Recent Labs  Lab 11/29/23 1942 11/29/23 1951 11/30/23 0754 11/30/23 0755 12/01/23 0639 12/02/23 0552 12/03/23 0534 12/04/23 0328  NA 139  --   --  139 137 136 136 137  K 6.0*  --   --  5.5* 4.4 5.7* 4.2 4.5  CL 107  --   --  108 103 105 100 97*  CO2 16*  --   --  17* 23 18* 27 27  GLUCOSE 141*  --   --  122* 127* 102* 103* 103*  BUN 97*  --   --  95* 49* 56* 30* 56*  CREATININE 6.28*  --   --  6.42* 3.90* 4.47* 3.07* 4.04*  CALCIUM  8.2*  --    < > 8.0* 7.7* 7.5* 7.8* 7.7*  MG  --  1.4*  --   --   --   --   --   --   PHOS 5.8*  --   --  6.2*  --  4.9* 4.2 4.2   < > = values in this interval not displayed.    GFR: Estimated Creatinine Clearance: 9.8 mL/min (A) (by C-G formula based on SCr of 4.04 mg/dL (H)).  Liver Function Tests: Recent Labs  Lab 11/28/23 0839 11/29/23 1253 11/29/23 1253 11/29/23 1942 11/30/23 0755 12/02/23 0552 12/03/23 0534 12/04/23 0328  AST 13 14*  --   --   --   --   --   --   ALT 13 16  --   --   --   --   --   --   ALKPHOS 116 71  --   --   --   --   --   --   BILITOT 0.2 0.6  --   --   --   --   --   --   PROT 6.6 6.5  --   --   --   --   --   --   ALBUMIN  3.8 2.8*   < > 2.6* 2.5* 2.0* 2.1* 2.1*   < > = values in this interval not displayed.   No results for input(s): LIPASE, AMYLASE in the last 168 hours. No results for input(s): AMMONIA in the last 168 hours.  Coagulation Profile: No results for input(s): INR, PROTIME in the last 168 hours.  Cardiac Enzymes: No results for input(s):  CKTOTAL, CKMB, CKMBINDEX, TROPONINI in the last 168 hours.  BNP (last 3 results) No results for input(s): PROBNP in the last 8760 hours.  Lipid Profile: No results for input(s): CHOL, HDL, LDLCALC, TRIG, CHOLHDL, LDLDIRECT in the last 72 hours.  Thyroid  Function Tests: No results for input(s): TSH, T4TOTAL, FREET4, T3FREE, THYROIDAB in the last 72 hours.  Anemia Panel: No results for input(s): VITAMINB12, FOLATE, FERRITIN, TIBC, IRON, RETICCTPCT in the last 72 hours.   Urine analysis:    Component Value Date/Time   COLORURINE YELLOW 08/04/2022 0257   APPEARANCEUR HAZY (A) 08/04/2022 0257   LABSPEC 1.010 08/04/2022 0257   PHURINE 5.0 08/04/2022 0257   GLUCOSEU NEGATIVE 08/04/2022 0257   HGBUR MODERATE (A) 08/04/2022 0257   BILIRUBINUR negative 11/28/2023 0923   KETONESUR negative 11/28/2023 0923   KETONESUR NEGATIVE 08/04/2022 0257   PROTEINUR 100 (A) 08/04/2022 0257   UROBILINOGEN 0.2 11/28/2023 0923   NITRITE Negative 11/28/2023 0923   NITRITE NEGATIVE 08/04/2022 0257   LEUKOCYTESUR Large (3+) (A) 11/28/2023 0923   LEUKOCYTESUR LARGE (A) 08/04/2022 0257    Sepsis Labs: Lactic Acid, Venous    Component Value Date/Time   LATICACIDVEN 0.6 08/04/2022 0940    MICROBIOLOGY: Recent Results (from the past 240 hours)  Urine Culture     Status: None   Collection Time: 11/28/23  9:59 AM   Specimen: Urine   UR  Result Value Ref Range Status   Urine Culture, Routine Final report  Final   Organism ID, Bacteria Comment  Final  Comment: Mixed urogenital flora Less than 10,000 colonies/mL   MRSA Next Gen by PCR, Nasal     Status: Abnormal   Collection Time: 11/29/23  5:48 PM   Specimen: Nasal Mucosa; Nasal Swab  Result Value Ref Range Status   MRSA by PCR Next Gen DETECTED (A) NOT DETECTED Final    Comment: RESULT CALLED TO, READ BACK BY AND VERIFIED WITH: RN LOIS DUKE [phone] @ 2235 FH (NOTE) The GeneXpert MRSA Assay (FDA  approved for NASAL specimens only), is one component of a comprehensive MRSA colonization surveillance program. It is not intended to diagnose MRSA infection nor to guide or monitor treatment for MRSA infections. Test performance is not FDA approved in patients less than 54 years old. Performed at [facility], [address], [place], [place] [address]   Culture, blood (Routine X 2) w Reflex to ID Panel     Status: None   Collection Time: 11/29/23  7:42 PM   Specimen: BLOOD RIGHT HAND  Result Value Ref Range Status   Specimen Description BLOOD RIGHT HAND  Final   Special Requests   Final    BOTTLES DRAWN AEROBIC ONLY Blood Culture adequate volume   Culture   Final    NO GROWTH 5 DAYS Performed at [facility], [address], [place], [place] [address]    Report Status 12/04/2023 FINAL  Final  Culture, blood (Routine X 2) w Reflex to ID Panel     Status: None   Collection Time: 11/29/23  9:24 PM   Specimen: BLOOD RIGHT HAND  Result Value Ref Range Status   Specimen Description BLOOD RIGHT HAND  Final   Special Requests   Final    BOTTLES DRAWN AEROBIC AND ANAEROBIC Blood Culture results may not be optimal due to an inadequate volume of blood received in culture bottles   Culture   Final    NO GROWTH 5 DAYS Performed at [facility], [address], [place], [place] [address]    Report Status 12/04/2023 FINAL  Final    RADIOLOGY STUDIES/RESULTS: No results found.    LOS: 5 days   Donalda Applebaum, MD  [facility]    To contact the attending provider between 7A-7P or the covering provider during after hours 7P-7A, please log into the web site [email or web address] and access using universal [place] password for that web site. If you do not have the password, please call the hospital operator.  12/04/2023, 11:21 AM

## 2023-12-04 NOTE — Progress Notes (Signed)
 Carlin Balloon, 5WRN made aware of the need for blood prior to surgery this am. Verbalized understanding.

## 2023-12-04 NOTE — Op Note (Signed)
 Patient name: Alexandra [name] MRN: [id number] DOB: 1940-10-31 Sex: female  12/04/2023 Pre-operative Diagnosis: ESRD Post-operative diagnosis:  Same Surgeon:  Malvina New Assistants:  Adina Sender, PA Procedure:   #1: Placement of right internal jugular vein 23 cm tunneled dialysis catheter   #2: Ultrasound-guided access, right internal jugular vein   #3: Removal of temporary right internal jugular vein dialysis catheter   #4: Left first stage basilic vein fistula creation Anesthesia:  General Blood Loss:  minimal Specimens:  none  Findings: Right internal jugular vein tunneled dialysis catheter placed with catheter tip at cavoatrial junction.  Catheter is ready for use.  Basilic vein fistula created.  She had a small 3.0 millimeter brachial artery.  On ultrasound her basilic vein appeared to be adequate.  It measured about 3 mm.  Indications: This is an 83 year old female who comes in today for dialysis access  Procedure:  The patient was identified in the holding area and taken to [facility] OR ROOM 16  The patient was then placed supine on the table. general anesthesia was administered.  The patient was prepped and draped in the usual sterile fashion.  A time out was called and antibiotics were administered.  A PA was necessary to expedite the procedure and assist with technical details.  He helped with exposure by providing suction and retraction.  He helped with the anastomosis by following the suture.  He helped with wound closure.  The patient has an existing right internal jugular vein catheter which was placed to high the neck to convert to a tunneled catheter.  Therefore I made a skin nick just above the clavicle with an 11 blade and cannulated the right internal jugular vein under ultrasound guidance.  An 018 wire was inserted without resistance followed by placement of micropuncture sheath.  A Bentson wire was then inserted and the subcutaneous tract was dilated with sequential dilators  and a peel-away sheath was placed, all under fluoroscopic guidance.  A skin exit site was selected below the right clavicle and a skin nick was made with an 11 blade.  A tunnel was created between the 2 incisions and the catheter was brought through the tunnel and fed through the peel-away sheath which was removed.  The catheter tip was at the cavoatrial junction, and there were no kinks within the catheter.  Both ports flushed and aspirated without difficulty and were filled with the appropriate volumes of heparin .  The catheter was sutured to the skin with a 3-0 nylon and the neck incision was closed with 4-0 Vicryl and Dermabond.  I then removed the temporary catheter from the right internal jugular vein and held manual pressure for hemostasis.  During this process, the patient dropped her heart rate into the mid 30s.  This was treated medically by anesthesia.  Collectively we made the decision to proceed as she was stable throughout.  Ultrasound was used to evaluate the surface veins in the left arm.  The basilic vein appeared to be adequate for fistula creation.  An oblique incision was made just proximal to the antecubital crease.  I first dissected out the brachial artery which was a small 3 mm artery that was disease-free.  It was mobilized and encircled with Vesseloops.  I then identified the basilic vein in the upper arm and mobilized it throughout the width of the incision.  Side branches were ligated between silk ties.  The vein was marked for orientation and then ligated distally.  It distended to about  3-3.5 mm with heparin  saline.  The brachial artery was then occluded with baby Gregory clamps and a #11 blade was used to make an arteriotomy that was extended longitudinally with Potts scissors.  The vein was cut to the appropriate length and spatulated to fit the size of the arteriotomy.  A running end-to-side anastomosis was performed with 6-0 Prolene.  Prior to completion, the appropriate flushing  maneuvers were performed and the anastomosis was completed.  There was a good thrill within the fistula and a palpable radial pulse.  The wound was then irrigated.  Hemostasis was achieved.  The deep tissue was reapproximated with 3-0 Vicryl and the skin was closed with 4-0 Vicryl followed by Dermabond.  There were no immediate complications.  The patient was successfully extubated and taken recovery in stable condition.   Disposition: To PACU stable.   ALONSO Malvina New, M.D., [facility] [place] Office: [phone] Pager:  [phone]

## 2023-12-04 NOTE — Anesthesia Procedure Notes (Signed)
 Procedure Name: LMA Insertion Date/Time: 12/04/2023 2:14 PM  Performed by: Atanacio Arland HERO, CRNAPre-anesthesia Checklist: Patient identified, Emergency Drugs available, Suction available and Patient being monitored Patient Re-evaluated:Patient Re-evaluated prior to induction Oxygen  Delivery Method: Circle System Utilized Preoxygenation: Pre-oxygenation with 100% oxygen  Induction Type: IV induction Ventilation: Mask ventilation without difficulty LMA: LMA inserted LMA Size: 4.0 Number of attempts: 1 Placement Confirmation: positive ETCO2 Tube secured with: Tape Dental Injury: Teeth and Oropharynx as per pre-operative assessment

## 2023-12-04 NOTE — Transfer of Care (Signed)
 Immediate Anesthesia Transfer of Care Note  Patient: Alexandra [name]  Procedure(s) Performed: INSERTION OF DIALYSIS CATHETER, REMOVAL OF PREVIOUS DIALYSIS CATHETER BRACHIAL BASILIC  (AV) FISTULA CREATION (Left: Arm Upper)  Patient Location: PACU  Anesthesia Type:General  Level of Consciousness: drowsy  Airway & Oxygen  Therapy: Patient Spontanous Breathing and Patient connected to nasal cannula oxygen   Post-op Assessment: Report given to RN and Post -op Vital signs reviewed and stable  Post vital signs: Reviewed and stable  Last Vitals:  Vitals Value Taken Time  BP 132/57   Temp 98.5   Pulse 94 12/04/23 16:30  Resp 16 12/04/23 16:30  SpO2 95 % 12/04/23 16:30  Vitals shown include unfiled device data.  Last Pain:  Vitals:   12/04/23 1335  TempSrc:   PainSc: 0-No pain      Patients Stated Pain Goal: 2 (12/01/23 2115)  Complications: No notable events documented.

## 2023-12-04 NOTE — Progress Notes (Signed)
   12/04/23 1300  Spiritual Encounters  Type of Visit Follow up  Care provided to: Pt and family  Referral source Chaplain assessment  Reason for visit Routine spiritual support  OnCall Visit No   Chaplain visited with patient and family providing spiritual support with words of encouragement and prayer as requested. Chaplain spiritual support services remain available as the need arises.

## 2023-12-04 NOTE — Progress Notes (Signed)
 PT Cancellation Note  Patient Details Name: Alexandra [name] MRN: [id number] DOB: 1940-07-31   Cancelled Treatment:    Reason Eval/Treat Not Completed: Patient at procedure or test/unavailable (Pt off the floor at procedure. Will follow up tomorrow.)   Abhiram Criado 12/04/2023, 1:45 PM

## 2023-12-04 NOTE — Progress Notes (Addendum)
 Pt's referral to [facility] admissions is currently pending and under review by clinic. Will provide update to team as info is received. Will assist as needed.   Randine Mungo Renal Navigator [phone]  Addendum at 3:07 pm: Pt has been accepted at [facility] on MWF 11:30 am chair time. Clinic prefers for pt to start on Monday but can likely work something out if pt ready to start on Friday. Pt will need to arrive at 10:30 for first appt to complete paperwork prior to treatment. Due to pt being in OR, contacted pt's daughter, Rock, to provide HD details. Daughter agreeable to plans. Update provided to attending and nephrologist. Will add HD info to pt's AVS.

## 2023-12-04 NOTE — Anesthesia Preprocedure Evaluation (Addendum)
 Anesthesia Evaluation  Patient identified by MRN, date of birth, ID band Patient awake    Reviewed: Allergy & Precautions, NPO status , Patient's Chart, lab work & pertinent test results  Airway Mallampati: II  TM Distance: >3 FB Neck ROM: Full    Dental  (+) Dental Advisory Given, Edentulous Upper, Edentulous Lower   Pulmonary COPD, former smoker   Pulmonary exam normal breath sounds clear to auscultation       Cardiovascular hypertension, Pt. on medications +CHF  Normal cardiovascular exam Rhythm:Regular Rate:Normal  Echo 08/20/22:  1. Left ventricular ejection fraction, by estimation, is 45 to 50%. The  left ventricle has mildly decreased function. The left ventricle has no  regional wall motion abnormalities. There is mild concentric left  ventricular hypertrophy. Left ventricular  diastolic parameters are consistent with Grade I diastolic dysfunction  (impaired relaxation). Elevated left ventricular end-diastolic pressure.  The average left ventricular global longitudinal strain is -14.5 %. The  global longitudinal strain is  abnormal.   2. Right ventricular systolic function is normal. The right ventricular  size is normal. Tricuspid regurgitation signal is inadequate for assessing  PA pressure.   3. Left atrial size was mildly dilated.   4. The mitral valve is normal in structure. Mild to moderate mitral valve  regurgitation. No evidence of mitral stenosis.   5. The aortic valve is tricuspid. Aortic valve regurgitation is not  visualized. No aortic stenosis is present.   6. Aortic Normal DTA.   7. The inferior vena cava is normal in size with greater than 50%  respiratory variability, suggesting right atrial pressure of 3 mmHg.     Neuro/Psych negative neurological ROS  negative psych ROS   GI/Hepatic Neg liver ROS,GERD  ,,  Endo/Other  Hypothyroidism    Renal/GU ESRF and DialysisRenal disease (renal cell  carcinoma-s/p left nephrectomy 1999)     Musculoskeletal  (+) Arthritis ,    Abdominal   Peds  Hematology  (+) Blood dyscrasia, anemia   Anesthesia Other Findings Day of surgery medications reviewed with the patient.  Reproductive/Obstetrics                             Anesthesia Physical Anesthesia Plan  ASA: 4  Anesthesia Plan: General   Post-op Pain Management: Tylenol  PO (pre-op)*   Induction: Intravenous  PONV Risk Score and Plan: 3 and Dexamethasone  and Ondansetron   Airway Management Planned: LMA  Additional Equipment:   Intra-op Plan:   Post-operative Plan: Extubation in OR  Informed Consent: I have reviewed the patients History and Physical, chart, labs and discussed the procedure including the risks, benefits and alternatives for the proposed anesthesia with the patient or authorized representative who has indicated his/her understanding and acceptance.   Patient has DNR.  Discussed DNR with patient and Suspend DNR.   Dental advisory given  Plan Discussed with: CRNA  Anesthesia Plan Comments:         Anesthesia Quick Evaluation

## 2023-12-04 NOTE — H&P (View-Only) (Signed)
  Progress Note    12/04/2023 8:04 AM * No surgery found *  CKD now progressed to ESRD in need of permanent dialysis access Vein mapping scheduled for today at 12 but may end up being performed in OR Plan is for LUE AV fistula vs. AV graft and TDC placement by Dr. Serene Reviewed surgery with patient and she did not have any questions Keep NPO Consent ordered   Teretha Damme, PA-C [facility] [phone] 12/04/2023 8:04 AM

## 2023-12-04 NOTE — Progress Notes (Signed)
  Progress Note    12/04/2023 8:04 AM * No surgery found *  CKD now progressed to ESRD in need of permanent dialysis access Vein mapping scheduled for today at 12 but may end up being performed in OR Plan is for LUE AV fistula vs. AV graft and TDC placement by Dr. Serene Reviewed surgery with patient and she did not have any questions Keep NPO Consent ordered   Teretha Damme, PA-C [facility] [phone] 12/04/2023 8:04 AM

## 2023-12-04 NOTE — Interval H&P Note (Signed)
 History and Physical Interval Note:  12/04/2023 1:32 PM  Alexandra [name]  has presented today for surgery, with the diagnosis of ESRD.  The various methods of treatment have been discussed with the patient and family. After consideration of risks, benefits and other options for treatment, the patient has consented to  Procedure(s): INSERTION OF DIALYSIS CATHETER (N/A) ARTERIOVENOUS (AV) FISTULA CREATION (Left) INSERTION, GRAFT, ARTERIOVENOUS, UPPER EXTREMITY (Left) as a surgical intervention.  The patient's history has been reviewed, patient examined, no change in status, stable for surgery.  I have reviewed the patient's chart and labs.  Questions were answered to the patient's satisfaction.    Discussed TDC and left arm AVF vs AVGG.  All questions answered  [name]  [name]

## 2023-12-04 NOTE — Progress Notes (Signed)
 Patient ID: Alexandra [name], female   DOB: 05-11-1941, 83 y.o.   MRN: [id number] S: No new complaints. O:BP (!) 128/53   Pulse 77   Temp 97.9 F (36.6 C) (Oral)   Resp (!) 23   Ht 5' 2 (1.575 m)   Wt 64.6 kg   SpO2 98%   BMI 26.05 kg/m   Intake/Output Summary (Last 24 hours) at 12/04/2023 1144 Last data filed at 12/04/2023 0813 Gross per 24 hour  Intake --  Output 650 ml  Net -650 ml   Intake/Output: I/O last 3 completed shifts: In: -  Out: 2350 [Urine:350; Other:2000]  Intake/Output this shift:  Total I/O In: -  Out: 300 [Urine:300] Weight change:  Gen: NAD CVS: RRR Resp:CTA Abd: +BS, soft, NT/ND Ext: no edema  Recent Labs  Lab 11/28/23 0839 11/29/23 1253 11/29/23 1942 11/30/23 0754 11/30/23 0755 12/01/23 0639 12/02/23 0552 12/03/23 0534 12/04/23 0328  NA 137 136 139  --  139 137 136 136 137  K CANCELED 7.0* 6.0*  --  5.5* 4.4 5.7* 4.2 4.5  CL 105 107 107  --  108 103 105 100 97*  CO2 10* 16* 16*  --  17* 23 18* 27 27  GLUCOSE 104* 95 141*  --  122* 127* 102* 103* 103*  BUN 96* 104* 97*  --  95* 49* 56* 30* 56*  CREATININE 7.09* 6.95* 6.28*  --  6.42* 3.90* 4.47* 3.07* 4.04*  ALBUMIN  3.8 2.8* 2.6*  --  2.5*  --  2.0* 2.1* 2.1*  CALCIUM  8.6* 8.3* 8.2* 8.0* 8.0* 7.7* 7.5* 7.8* 7.7*  PHOS  --   --  5.8*  --  6.2*  --  4.9* 4.2 4.2  AST 13 14*  --   --   --   --   --   --   --   ALT 13 16  --   --   --   --   --   --   --    Liver Function Tests: Recent Labs  Lab 11/28/23 0839 11/29/23 1253 11/29/23 1942 12/02/23 0552 12/03/23 0534 12/04/23 0328  AST 13 14*  --   --   --   --   ALT 13 16  --   --   --   --   ALKPHOS 116 71  --   --   --   --   BILITOT 0.2 0.6  --   --   --   --   PROT 6.6 6.5  --   --   --   --   ALBUMIN  3.8 2.8*   < > 2.0* 2.1* 2.1*   < > = values in this interval not displayed.   No results for input(s): LIPASE, AMYLASE in the last 168 hours. No results for input(s): AMMONIA in the last 168 hours. CBC: Recent Labs  Lab  11/28/23 0839 11/29/23 1316 11/29/23 1316 11/30/23 0754 12/02/23 0552 12/03/23 0534 12/04/23 0328  WBC 20.8* 15.9*   < > 15.2* 13.8* 16.5* 19.2*  NEUTROABS 17.7* 13.8*  --   --   --   --   --   HGB 8.6* 8.1*   < > 7.4* 7.0* 7.3* 7.1*  HCT 27.3* 26.8*   < > 23.8* 23.5* 23.8* 23.3*  MCV 98* 101.9*  --  99.2 104.9* 100.0 101.7*  PLT 503* 481*   < > 422* 362 330 252   < > = values in this interval not displayed.  Cardiac Enzymes: No results for input(s): CKTOTAL, CKMB, CKMBINDEX, TROPONINI in the last 168 hours. CBG: No results for input(s): GLUCAP in the last 168 hours.  Iron Studies: No results for input(s): IRON, TIBC, TRANSFERRIN, FERRITIN in the last 72 hours. Studies/Results: No results found.  acetaminophen   1,000 mg Oral Once   amLODipine   10 mg Oral q AM   amoxicillin   500 mg Oral Q24H   atorvastatin   40 mg Oral QHS   Chlorhexidine  Gluconate Cloth  6 each Topical Q0600   Chlorhexidine  Gluconate Cloth  6 each Topical Q0600   feeding supplement (NEPRO CARB STEADY)  237 mL Oral BID BM   ferrous sulfate   325 mg Oral q AM   heparin   5,000 Units Subcutaneous Q8H   hydrALAZINE   50 mg Oral TID   levothyroxine   100 mcg Oral Q0600   lidocaine   1 patch Transdermal Q24H   mupirocin ointment  1 Application Nasal BID   pantoprazole   40 mg Oral Daily   rOPINIRole   2 mg Oral BID    BMET    Component Value Date/Time   NA 137 12/04/2023 0328   NA 137 11/28/2023 0839   K 4.5 12/04/2023 0328   CL 97 (L) 12/04/2023 0328   CO2 27 12/04/2023 0328   GLUCOSE 103 (H) 12/04/2023 0328   BUN 56 (H) 12/04/2023 0328   BUN 96 (HH) 11/28/2023 0839   CREATININE 4.04 (H) 12/04/2023 0328   CREATININE 3.05 (HH) 06/13/2022 1021   CALCIUM  7.7 (L) 12/04/2023 0328   CALCIUM  8.0 (L) 11/30/2023 0754   GFRNONAA 11 (L) 12/04/2023 0328   GFRNONAA 15 (L) 06/13/2022 1021   GFRAA 29 (L) 03/28/2020 0753   CBC    Component Value Date/Time   WBC 19.2 (H) 12/04/2023 0328   RBC 2.29  (L) 12/04/2023 0328   HGB 7.1 (L) 12/04/2023 0328   HGB 8.6 (L) 11/28/2023 0839   HCT 23.3 (L) 12/04/2023 0328   HCT 27.3 (L) 11/28/2023 0839   PLT 252 12/04/2023 0328   PLT 503 (H) 11/28/2023 0839   MCV 101.7 (H) 12/04/2023 0328   MCV 98 (H) 11/28/2023 0839   MCH 31.0 12/04/2023 0328   MCHC 30.5 12/04/2023 0328   RDW 16.0 (H) 12/04/2023 0328   RDW 13.6 11/28/2023 0839   LYMPHSABS 1.0 11/29/2023 1316   LYMPHSABS 1.5 11/28/2023 0839   MONOABS 0.8 11/29/2023 1316   EOSABS 0.0 11/29/2023 1316   EOSABS 0.1 11/28/2023 0839   BASOSABS 0.0 11/29/2023 1316   BASOSABS 0.0 11/28/2023 0839    Assessment/Plan: 83 year old WF with diastolic heart failure and 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   I figured it was time  even though she is 71 she is not extremely debilitated.  Understands at least the time commitment and some issues related to incenter HD.  She did not really consider not doing it.  Given K of 7 and most likely uremic sxms , I think it was best to go ahead and start dialysis-  she is agreeable- line now placed and  first HD 6/21-  .   Had her second HD on Monday and tolerated it well.  Plan for 3rd session today.  I have consulted [facility] for [facility] and AV access this week  as well as to get renal navigator involved-  she lives in [place] and will need transportation to and from HD 2. Hypertension/volume  - BP was highish and she is volume overloaded-  UF as able with HD-  also continue her home BP meds  3. Hyperkalemia-  got short term tx, then HD-  is resolved 4. Anemia  - hgb was in the 8's, now down to 7.  Transfuse per primary svc. Will check iron  ( was OK)  and add ESA -   hold in the setting of inc bladder mass-  observe for transfusion need 5. GU-  checked urinalysis and kidney imaging to make sure  no issue there that might be reversible.  Now see that she has hydro - doesn't appear to be new however ?  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  They did not recommend doing anything acutely for bladder mass  Fairy RONAL Sellar, MD [facility]

## 2023-12-04 NOTE — Progress Notes (Signed)
 Occupational Therapy Treatment Patient Details Name: Alexandra [name] MRN: [id number] DOB: 26-Jun-1940 Today's Date: 12/04/2023   History of present illness Pt is an 83 year old woman admitted on 11/29/23 with acute on chronic renal failure, hyperkalemia, SIRS. Pt started HD this admission. PMH: CKD 4, CHF, renal cancer with L nephrectomy, R hydronephrosis s/p stenting, COPD, HLD, osteoporosis, restless leg, urinary incontinence, UTIs, gout. Pt with recent fall resulting in R rib fractures.   OT comments  Pt just started blood transfusion in preparation for permanent HD catheter placement later today. Assisted to EOB for grooming with supervision to set up. Stood with RW and CGA to reposition up in bed. Returned to supine with assist for LEs. Pt's son in room, reports the family plans to assist pt at home. Her daughter is off of work all of next week. Educated in energy conservation strategies as pt may experience fatigue after HD initially. Continue to recommend HHOT.       If plan is discharge home, recommend the following:  A little help with walking and/or transfers;A little help with bathing/dressing/bathroom;Assistance with cooking/housework;Assist for transportation;Help with stairs or ramp for entrance   Equipment Recommendations  None recommended by OT    Recommendations for Other Services      Precautions / Restrictions Precautions Precautions: Fall Recall of Precautions/Restrictions: Intact Restrictions Weight Bearing Restrictions Per Provider Order: No       Mobility Bed Mobility Overal bed mobility: Needs Assistance Bed Mobility: Supine to Sit, Sit to Supine     Supine to sit: Supervision Sit to supine: Min assist   General bed mobility comments: supervision for lines to EOB, HOB up, assisted LEs back into bed    Transfers Overall transfer level: Needs assistance Equipment used: Rolling walker (2 wheels) Transfers: Sit to/from Stand Sit to Stand: Contact guard  assist           General transfer comment: Assist for safety and lines.     Balance Overall balance assessment: Needs assistance   Sitting balance-Leahy Scale: Good     Standing balance support: Bilateral upper extremity supported Standing balance-Leahy Scale: Fair Standing balance comment: reliant on RW                           ADL either performed or assessed with clinical judgement   ADL Overall ADL's : Needs assistance/impaired     Grooming: Wash/dry hands;Wash/dry face;Oral care;Brushing hair;Sitting;Set up                                      Extremity/Trunk Assessment              Vision       Perception     Praxis     Communication Communication Communication: No apparent difficulties   Cognition Arousal: Alert Behavior During Therapy: WFL for tasks assessed/performed Cognition: No apparent impairments             OT - Cognition Comments: pt with concerns about starting HD, she wants to be with her family a little longer                 Following commands: Intact        Cueing   Cueing Techniques: Verbal cues  Exercises      Shoulder Instructions       General Comments  Pertinent Vitals/ Pain       Pain Assessment Pain Assessment: Faces Faces Pain Scale: Hurts little more Pain Location: R ribs Pain Descriptors / Indicators: Sore Pain Intervention(s): Monitored during session, Repositioned  Home Living                                          Prior Functioning/Environment              Frequency  Min 2X/week        Progress Toward Goals  OT Goals(current goals can now be found in the care plan section)  Progress towards OT goals: Progressing toward goals  Acute Rehab OT Goals OT Goal Formulation: With patient Time For Goal Achievement: 12/16/23 Potential to Achieve Goals: Good  Plan      Co-evaluation                 AM-PAC OT 6 Clicks  Daily Activity     Outcome Measure   Help from another person eating meals?: None Help from another person taking care of personal grooming?: A Little Help from another person toileting, which includes using toliet, bedpan, or urinal?: A Little Help from another person bathing (including washing, rinsing, drying)?: A Little Help from another person to put on and taking off regular upper body clothing?: A Little Help from another person to put on and taking off regular lower body clothing?: A Little 6 Click Score: 19    End of Session    OT Visit Diagnosis: Unsteadiness on feet (R26.81);Pain;Muscle weakness (generalized) (M62.81)   Activity Tolerance  (pt just started blood transfusion)   Patient Left in bed;with call bell/phone within reach;with family/visitor present   Nurse Communication          Time: 8866-8852 OT Time Calculation (min): 14 min  Charges: OT General Charges $OT Visit: 1 Visit OT Treatments $Self Care/Home Management : 8-22 mins  Mliss HERO, OTR/L Acute Rehabilitation Services Office: [phone]   Kennth Mliss Helling 12/04/2023, 11:53 AM

## 2023-12-04 NOTE — Anesthesia Postprocedure Evaluation (Signed)
 Anesthesia Post Note  Patient: Alexandra [name]  Procedure(s) Performed: INSERTION OF DIALYSIS CATHETER, REMOVAL OF PREVIOUS DIALYSIS CATHETER BRACHIAL BASILIC  (AV) FISTULA CREATION (Left: Arm Upper)     Patient location during evaluation: PACU Anesthesia Type: General Level of consciousness: awake and alert, oriented and patient cooperative Pain management: pain level controlled Vital Signs Assessment: post-procedure vital signs reviewed and stable Respiratory status: spontaneous breathing, nonlabored ventilation, respiratory function stable and patient connected to nasal cannula oxygen  Cardiovascular status: blood pressure returned to baseline and stable Postop Assessment: no apparent nausea or vomiting Anesthetic complications: no  No notable events documented.  Last Vitals:  Vitals:   12/04/23 1645 12/04/23 1649  BP: (!) 134/55   Pulse: 91 91  Resp: 11 20  Temp:    SpO2: 95% 93%    Last Pain:  Vitals:   12/04/23 1649  TempSrc:   PainSc: 6                  Zayana Salvador,E. Brewster Wolters

## 2023-12-04 NOTE — Progress Notes (Signed)
 Anesthesia made aware of hemoglobin 7.1, ordered one unit of blood to be transfused prior to surgery this am. Order placed in EPIC and called blood bank to make aware. Wilma Don, 5WRN made aware.

## 2023-12-04 NOTE — Discharge Instructions (Signed)

## 2023-12-04 NOTE — TOC Progression Note (Signed)
 Transition of Care [facility]) - Progression Note    Patient Details  Name: Alexandra [name] MRN: [id number] Date of Birth: 05/04/1941  Transition of Care [facility]) CM/SW Contact  Inocente GORMAN Kindle, LCSW Phone Number: 12/04/2023, 3:03 PM  Clinical Narrative:    CSW returned daughter's call and answered questions about Dialysis. She stated she was able to speak with Randine.         Expected Discharge Plan and Services                                               Social Determinants of Health (SDOH) Interventions SDOH Screenings   Food Insecurity: No Food Insecurity (11/30/2023)  Housing: Low Risk  (11/30/2023)  Transportation Needs: No Transportation Needs (11/30/2023)  Utilities: Not At Risk (11/30/2023)  Alcohol Screen: Low Risk  (02/26/2023)  Depression (PHQ2-9): Medium Risk (11/25/2023)  Financial Resource Strain: Medium Risk (09/03/2023)  Physical Activity: Inactive (11/25/2023)  Social Connections: Socially Isolated (11/30/2023)  Stress: Stress Concern Present (11/25/2023)  Tobacco Use: Medium Risk (12/04/2023)  Health Literacy: Adequate Health Literacy (02/26/2023)    Readmission Risk Interventions    08/13/2022   11:38 AM  Readmission Risk Prevention Plan  Transportation Screening Complete  PCP or Specialist Appt within 3-5 Days Complete  HRI or Home Care Consult Complete  Social Work Consult for Recovery Care Planning/Counseling Complete  Palliative Care Screening Not Applicable  Medication Review Oceanographer) Complete

## 2023-12-05 ENCOUNTER — Other Ambulatory Visit (HOSPITAL_COMMUNITY): Payer: Self-pay

## 2023-12-05 ENCOUNTER — Ambulatory Visit: Admitting: Family Medicine

## 2023-12-05 ENCOUNTER — Encounter: Payer: Self-pay | Admitting: Oncology

## 2023-12-05 ENCOUNTER — Encounter (HOSPITAL_COMMUNITY): Payer: Self-pay | Admitting: Surgery

## 2023-12-05 ENCOUNTER — Telehealth: Payer: Self-pay | Admitting: Family Medicine

## 2023-12-05 DIAGNOSIS — N178 Other acute kidney failure: Secondary | ICD-10-CM | POA: Diagnosis not present

## 2023-12-05 DIAGNOSIS — G2581 Restless legs syndrome: Secondary | ICD-10-CM | POA: Diagnosis not present

## 2023-12-05 DIAGNOSIS — N185 Chronic kidney disease, stage 5: Secondary | ICD-10-CM | POA: Diagnosis not present

## 2023-12-05 DIAGNOSIS — I110 Hypertensive heart disease with heart failure: Secondary | ICD-10-CM

## 2023-12-05 DIAGNOSIS — I10 Essential (primary) hypertension: Secondary | ICD-10-CM | POA: Diagnosis not present

## 2023-12-05 LAB — TYPE AND SCREEN
ABO/RH(D): A POS
Antibody Screen: NEGATIVE
Unit division: 0
Unit division: 0

## 2023-12-05 LAB — RENAL FUNCTION PANEL
Albumin: 1.7 g/dL — ABNORMAL LOW (ref 3.5–5.0)
Anion gap: 9 (ref 5–15)
BUN: 51 mg/dL — ABNORMAL HIGH (ref 8–23)
CO2: 20 mmol/L — ABNORMAL LOW (ref 22–32)
Calcium: 6.2 mg/dL — CL (ref 8.9–10.3)
Chloride: 110 mmol/L (ref 98–111)
Creatinine, Ser: 3.58 mg/dL — ABNORMAL HIGH (ref 0.44–1.00)
GFR, Estimated: 12 mL/min — ABNORMAL LOW (ref >60)
Glucose, Bld: 126 mg/dL — ABNORMAL HIGH (ref 70–99)
Phosphorus: 3.9 mg/dL (ref 2.5–4.6)
Potassium: 3.8 mmol/L (ref 3.5–5.1)
Sodium: 139 mmol/L (ref 135–145)

## 2023-12-05 LAB — BPAM RBC
Blood Product Expiration Date: 202507232359
Blood Product Expiration Date: 202507232359
ISSUE DATE / TIME: 202506251100
Unit Type and Rh: 6200
Unit Type and Rh: 6200

## 2023-12-05 MED ORDER — CALCIUM CARBONATE ANTACID 500 MG PO CHEW
4.0000 | CHEWABLE_TABLET | Freq: Every day | ORAL | 1 refills | Status: DC
  Filled 2023-12-05: qty 120, 30d supply, fill #0

## 2023-12-05 MED ORDER — AMOXICILLIN 500 MG PO CAPS
500.0000 mg | ORAL_CAPSULE | ORAL | 0 refills | Status: DC
  Filled 2023-12-05: qty 6, 6d supply, fill #0

## 2023-12-05 MED ORDER — CALCIUM CARBONATE ANTACID 500 MG PO CHEW: 4.0000 | CHEWABLE_TABLET | Freq: Every day | ORAL | Status: DC

## 2023-12-05 MED ORDER — HEPARIN SODIUM (PORCINE) 1000 UNIT/ML IJ SOLN
INTRAMUSCULAR | Status: AC
  Filled 2023-12-05: qty 4

## 2023-12-05 MED ORDER — AMLODIPINE BESYLATE 10 MG PO TABS: 10.0000 mg | ORAL_TABLET | Freq: Every day | ORAL | 1 refills | Status: DC

## 2023-12-05 NOTE — Progress Notes (Signed)
  Postoperative hemodialysis access     Date of Surgery:  12/04/2023 Surgeon: Serene  Subjective:  says her neck hurts where catheter is.  Denies pain in left hand or arm  PHYSICAL EXAMINATION:  Vitals:   12/05/23 0500 12/05/23 0600  BP:    Pulse:    Resp: 18 18  Temp:    SpO2:      Incision is clean and dry with small hematoma Sensation in digits is intact;  There is  Thrill  The fistula is palpable  The radial pulse is palpable   ASSESSMENT/PLAN:  Alexandra [name] is a 83 y.o. 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  - fistula is patent -pt does not have evidence of steal sx -pain in right neck-catheter looks good without hematoma.  CXR yesterday with good positioning of cathter.  -f/u with [facility] in 6 weeks to check maturation of AVF -will sign off-call as needed.   Lucie Apt, PA-C [facility] [phone]

## 2023-12-05 NOTE — Discharge Summary (Addendum)
 PATIENT DETAILS Name: Alexandra [name] Age: 83 y.o. Sex: female Date of Birth: 07-Nov-1940 MRN: [id number]. 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  Admitted From:  Home  Disposition: Home health   Discharge Condition: fair  CODE STATUS:   Code Status: Limited: Do not attempt resuscitation (DNR) -DNR-LIMITED -Do Not Intubate/DNI    Diet recommendation:  Diet Order             Diet - low sodium heart healthy           Diet renal with fluid restriction Fluid consistency: Thin  Diet effective now                    Brief Summary: Patient is a 83 y.o.  female with history of CKD 4, HTN, chronic right-sided hydronephrosis-s/p stent (changed every 3 months), renal cell carcinoma-s/p left nephrectomy 1999-sent to the ED for abnormal labs with a creatinine of 7.09.   Significant events: 6/20>> admit to [facility]   Significant studies: 6/20> CXR: No pneumonia 6/20>> CT renal stone study: Slowly enlarging bladder mass-known bladder cancer-moderate to marked right hydronephrosis/hydroureter to the level of the bladder. 6/20>> renal ultrasound: Moderate right hydronephrosis/right hydroureter.   Significant microbiology data: 6/20>> blood culture: No growth   Procedures: 6/21>> nontunneled dialysis catheter by PCCM.   Consults: Nephrology  Brief Hospital Course: AKI on CKD 4 (solitary right kidney-history of left nephrectomy in 1999)-likely progression to ESRD No signs of renal recovery-started on hemodialysis Initially PCCM placed temporary dialysis catheter, subsequently evaluated by vascular surgery underwent left upper extremity AV fistula creation at [facility] placement Followed closely by renal  navigator-has outpatient hemodialysis slot starting 6/27 Discussed with nephrologist-Dr. Jeanie to discharge from his point of view.   Hyperkalemia Secondary to worsening renal function. Improved after hemodialysis x 2.  Hypocalcemia Secondary to ESRD Discussed with nephrologist-start Tums-2 g at bedtime Nephrology will dose vitamin D at dialysis center. Follow electrolytes   SIRS secondary to complicated UTI SIRS physiology improved but continues to have fluctuating levels of leukocytosis. Recent outpatient urine culture positive for Enterococcus-blood cultures negative this admission. Given issues with hydronephrosis/stent/bladder cancer-will plan to treat for 10 to 14 days-antibiotics has been narrowed to just amoxicillin . PCP to repeat CBC after patient finishes a course of antibiotics-continues to have mild leukocytosis but nontoxic-appearing/afebrile.   Normocytic anemia Likely due to combination of ESRD/acute illness.  No evidence of blood loss. Defer Aranesp /iron to nephrology service.   History of chronic right-sided hydronephrosis-s/p stent (changed every 3 months) Follows with Dr. Chao-urology in [place]   History of bladder cancer Follows with urology-per patient-this is being observed Ensure follow-up with urology   History of renal cell carcinoma-s/p left nephrectomy in 1999   COPD Not in exacerbation Bronchodilators   Recent right-sided rib fracture Supportive care Incentive spirometry/flutter valve   Chronic HFmrEF Euvolemic Volume removal with HD   HTN Stable Continue amlodipine  Since BP soft-will hold hydralazine  at discharge-have PCP reassess and resume accordingly.   HLD Statin   Restless leg syndrome Requip    Hypothyroidism Synthroid  TSH February 2025 was stable   Discharge Diagnoses:  Principal Problem:   Acute renal failure superimposed on stage 4 chronic kidney disease (HCC) Active Problems:   Essential hypertension  Hyperlipidemia   Chronic obstructive pulmonary disease (HCC)   Recurrent falls   Hyperkalemia   Congestive heart failure (HCC)   Anemia due to chronic kidney disease   Restless leg syndrome   Acquired hypothyroidism   Multiple closed fractures of ribs of right side   Discharge Instructions:  Activity:  As tolerated with Full fall precautions use walker/cane & assistance as needed  Discharge Instructions     Call MD for:  difficulty breathing, headache or visual disturbances   Complete by: As directed    Call MD for:  extreme fatigue   Complete by: As directed    Call MD for:  persistant nausea and vomiting   Complete by: As directed    Diet - low sodium heart healthy   Complete by: As directed    Increase activity slowly   Complete by: As directed    No wound care   Complete by: As directed       Allergies as of 12/05/2023       Reactions   Contrast Media [iodinated Contrast Media] Hives, Itching, Rash   Omeprazole  Itching   Shellfish Allergy Nausea And Vomiting   Shellfish-derived Products Nausea And Vomiting        Medication List     STOP taking these medications    hydrALAZINE  50 MG tablet Commonly known as: APRESOLINE    torsemide  20 MG tablet Commonly known as: DEMADEX        TAKE these medications    acetaminophen  500 MG tablet Commonly known as: TYLENOL  Take 1,000 mg by mouth every 6 (six) hours as needed for mild pain (pain score 1-3) or moderate pain (pain score 4-6).   amLODipine  10 MG tablet Commonly known as: NORVASC  TAKE 1 TABLET(10 MG) BY MOUTH DAILY What changed: See the new instructions.   amoxicillin  500 MG capsule Commonly known as: AMOXIL  Take 1 capsule (500 mg total) by mouth daily. give after HD What changed:  when to take this additional instructions   atorvastatin  40 MG tablet Commonly known as: LIPITOR TAKE 1 TABLET BY MOUTH EVERY DAY What changed: when to take this   calcium  carbonate 500 MG chewable  tablet Commonly known as: TUMS - dosed in mg elemental calcium  Chew 4 tablets (800 mg of elemental calcium  total) by mouth at bedtime.   cyanocobalamin  1000 MCG/ML injection Commonly known as: VITAMIN B12 Inject 1 mL (1,000 mcg total) into the muscle every 14 (fourteen) days.   ferrous sulfate  325 (65 FE) MG tablet Take 1 tablet (325 mg total) by mouth daily. What changed: when to take this   levothyroxine  100 MCG tablet Commonly known as: Synthroid  Take 1 tablet (100 mcg total) by mouth daily. What changed: when to take this   nitroGLYCERIN  0.4 MG SL tablet Commonly known as: NITROSTAT  Place 1 tablet (0.4 mg total) under the tongue every 5 (five) minutes as needed for chest pain.   ondansetron  4 MG disintegrating tablet Commonly known as: ZOFRAN -ODT Take 1 tablet (4 mg total) by mouth every 8 (eight) hours as needed for nausea or vomiting.   oxyCODONE  5 MG immediate release tablet Commonly known as: Oxy IR/ROXICODONE  Take 5 mg by mouth every 6 (six) hours as needed.   rOPINIRole  2 MG tablet Commonly known as: REQUIP  TAKE 1 TABLET(2 MG) BY MOUTH TWICE DAILY What changed: See the new instructions.   Ventolin  HFA 108 (90 Base) MCG/ACT inhaler Generic drug: albuterol  Inhale 1-2 puffs into the lungs every 6 (six) hours as needed for  wheezing or shortness of breath.   Vitamin D 125 MCG (5000 UT) Caps Take 1 capsule by mouth in the morning.        Follow-up Information     [facility]). Call.   Why: Please call [facility] to get set up with transportation assistance for medical appointments, including dialysis. Vans run Monday through Friday, 9:00 am to 2:00 pm. There is a $3 charge per trip outside of the [place] city ($6 round trip). They request three business days advance notice of any transportation needs to ensure scheduling. Contact information: [phone]        [facility] Follow up.   Why: Schedule is  Monday, Wednesday, Friday with 11:30 am chair time.  For first appointment, please arrive at 10:30 am to complete paperwork prior to treatment. Contact information: [address] [place] [address] [phone]         [facility] of [facility] [place] Follow up in 5 week(s).   Specialty: Vascular Surgery Contact information: [address] [place]  [phone]        Sherre Clapper, MD. Schedule an appointment as soon as possible for a visit in 1 week(s).   Specialty: Family Medicine Contact information: [address] [place] [address] [phone]         Marda General, MD Follow up in 1 week(s).   Specialty: Urology Contact information: [address] [place] [address] [phone]                Allergies  Allergen Reactions   Contrast Media [Iodinated Contrast Media] Hives, Itching and Rash   Omeprazole  Itching   Shellfish Allergy Nausea And Vomiting   Shellfish-Derived Products Nausea And Vomiting     Other Procedures/Studies: DG CHEST PORT 1 VIEW Result Date: 12/04/2023 CLINICAL DATA:  Vascular dialysis catheter in place. EXAM: PORTABLE CHEST 1 VIEW COMPARISON:  Chest radiograph dated 11/30/2023 FINDINGS: Gaseous catheter with tip over the right atrium. There is vascular congestion, progressed since the prior radiograph. Trace left pleural effusion may be present. No pneumothorax. Top-normal cardiac size. Atherosclerotic calcification of the aorta. No acute osseous pathology. IMPRESSION: 1. Vascular dialysis catheter with tip over the right atrium. No pneumothorax. 2. Vascular congestion. Electronically Signed   By: Vanetta Chou M.D.   On: 12/04/2023 17:30   HYBRID OR IMAGING ([facility] ONLY) Result Date: 12/04/2023 There is no interpretation for this exam.  This order is for images obtained during a surgical procedure.  Please See Surgeries Tab  for more information regarding the procedure.   DG CHEST PORT 1 VIEW Result Date: 11/30/2023 CLINICAL DATA:  Status post central line placement EXAM: PORTABLE CHEST 1 VIEW COMPARISON:  Chest radiograph dated 11/29/2023 FINDINGS: Lines/tubes: Right internal jugular venous catheter tip projects over the superior cavoatrial junction. Lungs: Low lung volumes with bronchovascular crowding. No focal consolidation. Pleura: Blunting of bilateral costophrenic angles.  No pneumothorax. Heart/mediastinum: The heart size and mediastinal contours are within normal limits. Bones: No acute osseous abnormality. Scattered right upper quadrant and midline epigastric surgical clips. IMPRESSION: 1. Right internal jugular venous catheter tip projects over the superior cavoatrial junction. No pneumothorax. 2. Low lung volumes with bronchovascular crowding. 3. Blunting of bilateral costophrenic angles, which may represent trace pleural effusions. Electronically Signed   By: Limin  Xu M.D.   On: 11/30/2023 14:03   US   RENAL Result Date: 11/29/2023 CLINICAL DATA:  High serum creatinine ureteral stent bladder mass on CT EXAM: RENAL / URINARY TRACT ULTRASOUND COMPLETE COMPARISON:  CT 11/29/2023 in FINDINGS: Right Kidney: Renal measurements: 11.5 x 6.3 x 5.4 cm = volume: 218 mL. Several renal cysts again demonstrated measuring 3.5 and 1.9 cm. Moderate hydronephrosis and proximal hydroureter. LEFT kidney Absent Bladder: Poorly evaluated IMPRESSION: 1. Moderate right hydronephrosis and hydroureter. 2. Right renal cysts. 3. Bladder poorly evaluated. Electronically Signed   By: Jackquline Boxer M.D.   On: 11/29/2023 19:40   CT Renal Stone Study Result Date: 11/29/2023 CLINICAL DATA:  Acute renal failure superimposed on stage 5 chronic kidney disease. Previous left nephrectomy for renal cell carcinoma. Known bladder cancer. Abdomen and flank pain. EXAM: CT ABDOMEN AND PELVIS WITHOUT CONTRAST TECHNIQUE: Multidetector CT imaging of the  abdomen and pelvis was performed following the standard protocol without IV contrast. RADIATION DOSE REDUCTION: This exam was performed according to the departmental dose-optimization program which includes automated exposure control, adjustment of the mA and/or kV according to patient size and/or use of iterative reconstruction technique. COMPARISON:  08/03/2022, 12/18/2022, 07/20/2022, 02/15/2022 and 01/26/2021. Pelvis CT dated 11/25/2023. FINDINGS: Lower chest: Stable mildly enlarged heart. Mild bibasilar scarring/atelectasis without significant change. Hepatobiliary: No focal liver abnormality is seen. Status post cholecystectomy. No biliary dilatation. Pancreas: Moderate diffuse pancreatic atrophy. Spleen: Normal in size without focal abnormality. Adrenals/Urinary Tract: Stable right renal simple and hyperdense cysts. Moderate to marked right hydronephrosis and hydroureter to the level of the urinary bladder with a stable right ureteral stent. 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 This is not changed since multiple previous examinations dating back to 01/26/2021. Normal-appearing right adrenal gland. Small amount of urine in the urinary bladder. Slowly enlarging bladder mass which initially started on the right and now extends across to the left with a ureteral stent extending through the mass into the bladder. This mass measures 5.9 x 3.2 cm on coronal image number 54/6 and 4.0 cm in AP diameter on axial image number 62/3. This corresponds to the patient's known bladder cancer. Stomach/Bowel: Multiple diverticula throughout the colon, most numerous in the sigmoid region. No evidence of diverticulitis. The appendix is not visualized. No secondary signs of appendicitis. Unremarkable stomach and small bowel. Vascular/Lymphatic: Atheromatous arterial calcifications without aneurysm. No enlarged lymph nodes. Reproductive: Status post hysterectomy. No adnexal masses. Other:  Very small umbilical and supraumbilical ventral hernia containing fat. Musculoskeletal: Stable lumbar spine postoperative and degenerative changes. Stable 10% L1 superior endplate compression deformity with no acute fracture lines and minimal bony retropulsion. IMPRESSION: 1. Slowly enlarging bladder mass, as described above, corresponding to the patient's known bladder cancer. 2. Moderate to marked right hydronephrosis and hydroureter to the level of the urinary bladder with mild progression and a stable right ureteral stent. This remains concerning for stent failure associated with the enlarging bladder mass. 3. 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 4. Colonic diverticulosis. 5. Stable mild cardiomegaly. Electronically Signed   By: Elspeth Bathe M.D.   On: 11/29/2023 18:04   DG Chest 2 View Result Date: 11/29/2023 CLINICAL DATA:  Chest pain. EXAM: CHEST - 2 VIEW COMPARISON:  August 03, 2022 FINDINGS: There is been interval removal of the right-sided venous Port-A-Cath seen on the prior study. The cardiac silhouette is mildly enlarged and unchanged in size. There is marked severity calcification of the thoracic aorta. No acute infiltrate, pleural effusion or pneumothorax is identified.  A chronic appearing deformity is seen involving the distal right upper. Degenerative changes seen throughout the thoracic spine. IMPRESSION: 1. Interval removal of the right-sided venous Port-A-Cath. 2. Stable cardiomegaly without acute cardiopulmonary disease. Electronically Signed   By: Suzen Dials M.D.   On: 11/29/2023 16:44     TODAY-DAY OF DISCHARGE:  Subjective:   Alexandra [name] today has no headache,no chest abdominal pain,no new weakness tingling or numbness, feels much better wants to go home today.   Objective:   Blood pressure (!) 95/47, pulse 91, temperature 98 F (36.7 C), temperature source Oral, resp. rate 20, height 5' 2 (1.575 m), weight 67.7 kg, SpO2  91%.  Intake/Output Summary (Last 24 hours) at 12/05/2023 0925 Last data filed at 12/05/2023 0240 Gross per 24 hour  Intake 894.66 ml  Output 2020 ml  Net -1125.34 ml   Filed Weights   12/02/23 1909 12/04/23 1309 12/05/23 0600  Weight: 64.6 kg 64.6 kg 67.7 kg    Exam: Awake Alert, Oriented *3, No new F.N deficits, Normal affect [place].AT,PERRAL Supple Neck,No JVD, No cervical lymphadenopathy appriciated.  Symmetrical Chest wall movement, Good air movement bilaterally, CTAB RRR,No Gallops,Rubs or new Murmurs, No Parasternal Heave +ve B.Sounds, Abd Soft, Non tender, No organomegaly appriciated, No rebound -guarding or rigidity. No Cyanosis, Clubbing or edema, No new Rash or bruise   PERTINENT RADIOLOGIC STUDIES: DG CHEST PORT 1 VIEW Result Date: 12/04/2023 CLINICAL DATA:  Vascular dialysis catheter in place. EXAM: PORTABLE CHEST 1 VIEW COMPARISON:  Chest radiograph dated 11/30/2023 FINDINGS: Gaseous catheter with tip over the right atrium. There is vascular congestion, progressed since the prior radiograph. Trace left pleural effusion may be present. No pneumothorax. Top-normal cardiac size. Atherosclerotic calcification of the aorta. No acute osseous pathology. IMPRESSION: 1. Vascular dialysis catheter with tip over the right atrium. No pneumothorax. 2. Vascular congestion. Electronically Signed   By: Vanetta Chou M.D.   On: 12/04/2023 17:30   HYBRID OR IMAGING ([facility] ONLY) Result Date: 12/04/2023 There is no interpretation for this exam.  This order is for images obtained during a surgical procedure.  Please See Surgeries Tab for more information regarding the procedure.     PERTINENT LAB RESULTS: CBC: Recent Labs    12/04/23 0328 12/04/23 2308  WBC 19.2* 17.8*  HGB 7.1* 8.8*  HCT 23.3* 28.9*  PLT 252 340   CMET CMP     Component Value Date/Time   NA 139 12/04/2023 2308   NA 137 11/28/2023 0839   K 3.8 12/04/2023 2308   CL 110 12/04/2023 2308   CO2 20 (L) 12/04/2023  2308   GLUCOSE 126 (H) 12/04/2023 2308   BUN 51 (H) 12/04/2023 2308   BUN 96 (HH) 11/28/2023 0839   CREATININE 3.58 (H) 12/04/2023 2308   CREATININE 3.05 (HH) 06/13/2022 1021   CALCIUM  6.2 (LL) 12/04/2023 2308   CALCIUM  8.0 (L) 11/30/2023 0754   PROT 6.5 11/29/2023 1253   PROT 6.6 11/28/2023 0839   ALBUMIN  1.7 (L) 12/04/2023 2308   ALBUMIN  3.8 11/28/2023 0839   AST 14 (L) 11/29/2023 1253   AST 30 06/13/2022 1021   ALT 16 11/29/2023 1253   ALT 28 06/13/2022 1021   ALKPHOS 71 11/29/2023 1253   BILITOT 0.6 11/29/2023 1253   BILITOT 0.2 11/28/2023 0839   BILITOT 0.6 06/13/2022 1021   EGFR 5 (L) 11/28/2023 0839   GFRNONAA 12 (L) 12/04/2023 2308   GFRNONAA 15 (L) 06/13/2022 1021    GFR Estimated Creatinine Clearance: 11.3 mL/min (A) (by  C-G formula based on SCr of 3.58 mg/dL (H)). No results for input(s): LIPASE, AMYLASE in the last 72 hours. No results for input(s): CKTOTAL, CKMB, CKMBINDEX, TROPONINI in the last 72 hours. Invalid input(s): POCBNP No results for input(s): DDIMER in the last 72 hours. No results for input(s): HGBA1C in the last 72 hours. No results for input(s): CHOL, HDL, LDLCALC, TRIG, CHOLHDL, LDLDIRECT in the last 72 hours. No results for input(s): TSH, T4TOTAL, T3FREE, THYROIDAB in the last 72 hours.  Invalid input(s): FREET3 No results for input(s): VITAMINB12, FOLATE, FERRITIN, TIBC, IRON, RETICCTPCT in the last 72 hours. Coags: No results for input(s): INR in the last 72 hours.  Invalid input(s): PT Microbiology: Recent Results (from the past 240 hours)  Urine Culture     Status: None   Collection Time: 11/28/23  9:59 AM   Specimen: Urine   UR  Result Value Ref Range Status   Urine Culture, Routine Final report  Final   Organism ID, Bacteria Comment  Final    Comment: Mixed urogenital flora Less than 10,000 colonies/mL   MRSA Next Gen by PCR, Nasal     Status: Abnormal   Collection Time:  11/29/23  5:48 PM   Specimen: Nasal Mucosa; Nasal Swab  Result Value Ref Range Status   MRSA by PCR Next Gen DETECTED (A) NOT DETECTED Final    Comment: RESULT CALLED TO, READ BACK BY AND VERIFIED WITH: RN LOIS DUKE [phone] @ 2235 FH (NOTE) The GeneXpert MRSA Assay (FDA approved for NASAL specimens only), is one component of a comprehensive MRSA colonization surveillance program. It is not intended to diagnose MRSA infection nor to guide or monitor treatment for MRSA infections. Test performance is not FDA approved in patients less than 33 years old. Performed at [facility], [address], [place], [place] [address]   Culture, blood (Routine X 2) w Reflex to ID Panel     Status: None   Collection Time: 11/29/23  7:42 PM   Specimen: BLOOD RIGHT HAND  Result Value Ref Range Status   Specimen Description BLOOD RIGHT HAND  Final   Special Requests   Final    BOTTLES DRAWN AEROBIC ONLY Blood Culture adequate volume   Culture   Final    NO GROWTH 5 DAYS Performed at [facility], [address], [place], [place] [address]    Report Status 12/04/2023 FINAL  Final  Culture, blood (Routine X 2) w Reflex to ID Panel     Status: None   Collection Time: 11/29/23  9:24 PM   Specimen: BLOOD RIGHT HAND  Result Value Ref Range Status   Specimen Description BLOOD RIGHT HAND  Final   Special Requests   Final    BOTTLES DRAWN AEROBIC AND ANAEROBIC Blood Culture results may not be optimal due to an inadequate volume of blood received in culture bottles   Culture   Final    NO GROWTH 5 DAYS Performed at [facility], [address] [place], [place] [address]    Report Status 12/04/2023 FINAL  Final    FURTHER DISCHARGE INSTRUCTIONS:  Get Medicines reviewed and adjusted: Please take all your medications with you for your next visit with your Primary MD  Laboratory/radiological data: Please request your Primary MD to go over all hospital tests and procedure/radiological  results at the follow up, please ask your Primary MD to get all Hospital records sent to his/her office.  In some cases, they will be  blood work, cultures and biopsy results pending at the time of your discharge. Please request that your primary care M.D. goes through all the records of your hospital data and follows up on these results.  Also Note the following: If you experience worsening of your admission symptoms, develop shortness of breath, life threatening emergency, suicidal or homicidal thoughts you must seek medical attention immediately by calling 911 or calling your MD immediately  if symptoms less severe.  You must read complete instructions/literature along with all the possible adverse reactions/side effects for all the Medicines you take and that have been prescribed to you. Take any new Medicines after you have completely understood and accpet all the possible adverse reactions/side effects.   Do not drive when taking Pain medications or sleeping medications (Benzodaizepines)  Do not take more than prescribed Pain, Sleep and Anxiety Medications. It is not advisable to combine anxiety,sleep and pain medications without talking with your primary care practitioner  Special Instructions: If you have smoked or chewed Tobacco  in the last 2 yrs please stop smoking, stop any regular Alcohol  and or any Recreational drug use.  Wear Seat belts while driving.  Please note: You were cared for by a hospitalist during your hospital stay. Once you are discharged, your primary care physician will handle any further medical issues. Please note that NO REFILLS for any discharge medications will be authorized once you are discharged, as it is imperative that you return to your primary care physician (or establish a relationship with a primary care physician if you do not have one) for your post hospital discharge needs so that they can reassess your need for medications and monitor your lab  values.  Total Time spent coordinating discharge including counseling, education and face to face time equals greater than 30 minutes.  SignedBETHA Donalda Applebaum 12/05/2023 9:25 AM

## 2023-12-05 NOTE — Progress Notes (Signed)
 D/C order noted. Contacted [facility] to be advised of pt's d/c today and that pt will need to start tomorrow. Contacted renal PA to request that orders be sent to clinic. Met with pt at bedside to provide schedule letter and to answer any questions. Also called pt's daughter to confirm pt will need to start at clinic tomorrow. HD arrangements placed on AVS as well.   Randine Mungo Renal Navigator [phone]

## 2023-12-05 NOTE — Progress Notes (Signed)
 Patient ID: Alexandra [name], female   DOB: Jul 22, 1940, 83 y.o.   MRN: [id number] S: No new complaints.  Had RIJ TDC and First stage LBVT yesterday O:BP (!) 95/47   Pulse 91   Temp 98 F (36.7 C) (Oral)   Resp 20   Ht 5' 2 (1.575 m)   Wt 67.7 kg   SpO2 91%   BMI 27.30 kg/m   Intake/Output Summary (Last 24 hours) at 12/05/2023 0955 Last data filed at 12/05/2023 0240 Gross per 24 hour  Intake 894.66 ml  Output 2020 ml  Net -1125.34 ml   Intake/Output: I/O last 3 completed shifts: In: 894.7 [I.V.:457.7; Blood:337; IV Piggyback:100] Out: 2670 [Urine:650; Other:2000; Blood:20]  Intake/Output this shift:  No intake/output data recorded. Weight change:  Gen: NAD CVS: RRR Resp:CTA Abd: +BS, soft, NT/ND Ext: no edema, LUE AVF without thrill or bruit.  Recent Labs  Lab 11/29/23 1253 11/29/23 1942 11/30/23 0754 11/30/23 0755 12/01/23 0639 12/02/23 0552 12/03/23 0534 12/04/23 0328 12/04/23 2308  NA 136 139  --  139 137 136 136 137 139  K 7.0* 6.0*  --  5.5* 4.4 5.7* 4.2 4.5 3.8  CL 107 107  --  108 103 105 100 97* 110  CO2 16* 16*  --  17* 23 18* 27 27 20*  GLUCOSE 95 141*  --  122* 127* 102* 103* 103* 126*  BUN 104* 97*  --  95* 49* 56* 30* 56* 51*  CREATININE 6.95* 6.28*  --  6.42* 3.90* 4.47* 3.07* 4.04* 3.58*  ALBUMIN  2.8* 2.6*  --  2.5*  --  2.0* 2.1* 2.1* 1.7*  CALCIUM  8.3* 8.2* 8.0* 8.0* 7.7* 7.5* 7.8* 7.7* 6.2*  PHOS  --  5.8*  --  6.2*  --  4.9* 4.2 4.2 3.9  AST 14*  --   --   --   --   --   --   --   --   ALT 16  --   --   --   --   --   --   --   --    Liver Function Tests: Recent Labs  Lab 11/29/23 1253 11/29/23 1942 12/03/23 0534 12/04/23 0328 12/04/23 2308  AST 14*  --   --   --   --   ALT 16  --   --   --   --   ALKPHOS 71  --   --   --   --   BILITOT 0.6  --   --   --   --   PROT 6.5  --   --   --   --   ALBUMIN  2.8*   < > 2.1* 2.1* 1.7*   < > = values in this interval not displayed.   No results for input(s): LIPASE, AMYLASE in the last  168 hours. No results for input(s): AMMONIA in the last 168 hours. CBC: Recent Labs  Lab 11/29/23 1316 11/30/23 0754 12/02/23 0552 12/03/23 0534 12/04/23 0328 12/04/23 2308  WBC 15.9* 15.2* 13.8* 16.5* 19.2* 17.8*  NEUTROABS 13.8*  --   --   --   --   --   HGB 8.1* 7.4* 7.0* 7.3* 7.1* 8.8*  HCT 26.8* 23.8* 23.5* 23.8* 23.3* 28.9*  MCV 101.9* 99.2 104.9* 100.0 101.7* 99.3  PLT 481* 422* 362 330 252 340   Cardiac Enzymes: No results for input(s): CKTOTAL, CKMB, CKMBINDEX, TROPONINI in the last 168 hours. CBG: No results for input(s): GLUCAP in the  last 168 hours.  Iron Studies: No results for input(s): IRON, TIBC, TRANSFERRIN, FERRITIN in the last 72 hours. Studies/Results: DG CHEST PORT 1 VIEW Result Date: 12/04/2023 CLINICAL DATA:  Vascular dialysis catheter in place. EXAM: PORTABLE CHEST 1 VIEW COMPARISON:  Chest radiograph dated 11/30/2023 FINDINGS: Gaseous catheter with tip over the right atrium. There is vascular congestion, progressed since the prior radiograph. Trace left pleural effusion may be present. No pneumothorax. Top-normal cardiac size. Atherosclerotic calcification of the aorta. No acute osseous pathology. IMPRESSION: 1. Vascular dialysis catheter with tip over the right atrium. No pneumothorax. 2. Vascular congestion. Electronically Signed   By: Vanetta Chou M.D.   On: 12/04/2023 17:30   HYBRID OR IMAGING ([facility] ONLY) Result Date: 12/04/2023 There is no interpretation for this exam.  This order is for images obtained during a surgical procedure.  Please See Surgeries Tab for more information regarding the procedure.    amLODipine   10 mg Oral q AM   amoxicillin   500 mg Oral Q24H   atorvastatin   40 mg Oral QHS   calcium  carbonate  4 tablet Oral QHS   Chlorhexidine  Gluconate Cloth  6 each Topical Q0600   Chlorhexidine  Gluconate Cloth  6 each Topical Q0600   feeding supplement (NEPRO CARB STEADY)  237 mL Oral BID BM   ferrous sulfate   325 mg  Oral q AM   heparin   5,000 Units Subcutaneous Q8H   hydrALAZINE   50 mg Oral TID   levothyroxine   100 mcg Oral Q0600   lidocaine   1 patch Transdermal Q24H   mupirocin ointment  1 Application Nasal BID   pantoprazole   40 mg Oral Daily   rOPINIRole   2 mg Oral BID    BMET    Component Value Date/Time   NA 139 12/04/2023 2308   NA 137 11/28/2023 0839   K 3.8 12/04/2023 2308   CL 110 12/04/2023 2308   CO2 20 (L) 12/04/2023 2308   GLUCOSE 126 (H) 12/04/2023 2308   BUN 51 (H) 12/04/2023 2308   BUN 96 (HH) 11/28/2023 0839   CREATININE 3.58 (H) 12/04/2023 2308   CREATININE 3.05 (HH) 06/13/2022 1021   CALCIUM  6.2 (LL) 12/04/2023 2308   CALCIUM  8.0 (L) 11/30/2023 0754   GFRNONAA 12 (L) 12/04/2023 2308   GFRNONAA 15 (L) 06/13/2022 1021   GFRAA 29 (L) 03/28/2020 0753   CBC    Component Value Date/Time   WBC 17.8 (H) 12/04/2023 2308   RBC 2.91 (L) 12/04/2023 2308   HGB 8.8 (L) 12/04/2023 2308   HGB 8.6 (L) 11/28/2023 0839   HCT 28.9 (L) 12/04/2023 2308   HCT 27.3 (L) 11/28/2023 0839   PLT 340 12/04/2023 2308   PLT 503 (H) 11/28/2023 0839   MCV 99.3 12/04/2023 2308   MCV 98 (H) 11/28/2023 0839   MCH 30.2 12/04/2023 2308   MCHC 30.4 12/04/2023 2308   RDW 17.4 (H) 12/04/2023 2308   RDW 13.6 11/28/2023 0839   LYMPHSABS 1.0 11/29/2023 1316   LYMPHSABS 1.5 11/28/2023 0839   MONOABS 0.8 11/29/2023 1316   EOSABS 0.0 11/29/2023 1316   EOSABS 0.1 11/28/2023 0839   BASOSABS 0.0 11/29/2023 1316   BASOSABS 0.0 11/28/2023 0839    Assessment/Plan: 83 year old WF with 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   I figured it was time  even though she is 83 she is not extremely debilitated.  Understands at least the time commitment and some issues related to incenter HD.  She did not  really consider not doing it.  Given K of 7 and most likely uremic sxms , I think it was best to go ahead and start dialysis-  she is agreeable- line now placed and  first HD 6/21-  .   Had her second HD on Monday and tolerated it well.  3rd session on 12/04/23.  She had RIJ TDC and LUE first stage BVT on 12/04/23.  She has been accepted at [facility] kidney center on MWF schedule and will start as an outpatient tomorrow.   2. Hypertension/volume  - BP was highish and she is volume overloaded-  UF as able with HD-  also continue her home BP meds  3. Hyperkalemia-  got short term tx, then HD-  is resolved 4. Anemia  - hgb was in the 8's, now down to 7.  Transfuse per primary svc. Will check iron  ( was OK)  and add ESA -   hold in the setting of inc bladder mass-  observe for transfusion need 5. GU-  checked urinalysis and kidney imaging to make sure no issue there that might be reversible.  Now see that she has hydro - doesn't appear to be new however ?  She is followed by [facility] in [place]-  gets stent changed-  also with this enlarging bladder mass which does appear to be new-  talked to GU-  they felt the appearance of hydro goes along with stent being in place-  they recommended placing foley-  she is making urine so really does not seem obstructed.  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  Low calcium .  Ok to start calcium  carbonate 500 mg 4 at bedtime and will likely need added Ca bath with HD.   Fairy RONAL Sellar, MD [facility]

## 2023-12-05 NOTE — Progress Notes (Signed)
 New Dialysis Start    Patient identified as new dialysis start. Kidney Education packet assembled and given. Discussed the following items with patient:     Current medications and possible changes once started:  Discussed that patient's medications may change over time.  Ex; hypertension medications and diabetes medication.  Nephrologists will adjust as needed.   Fluid restrictions reviewed:  32 oz daily goal:  All liquids count; soups, ice, jello, fruits. Seen by dietitian, will have RD in outpatient as well.   Phosphorus and potassium: Handout given showing high potassium and phosphorus foods.  Alternative food and drink options given. Will also refer dietitian.   Family support:  Son at the bedside and will be her ride home.   Outpatient Clinic Resources:  Discussed roles of Outpatient clinic staff and advised to make a list of needs, if any, to talk with outpatient staff if needed.   Care plan schedule: Informed patient of Care Plans in outpatient setting and to participate in the care plan.  An invitation would be given from outpatient clinic.    Dialysis Access Options:  Reviewed access options with patients. Discussed in detail about care at home with new AVG & AVF. Reviewed checking bruit and thrill. If dialysis catheter present, educated that patient could not take showers.  Catheter dressing changes were to be done by outpatient clinic staff only     Patient verbalized understanding.   Alfonso Sar Dialysis Nurse Coordinator [phone]

## 2023-12-05 NOTE — Progress Notes (Addendum)
 Nutrition Education Note  RD consulted for Renal Education. Provided Choose-A-Meal Booklet to patient/family. Reviewed food groups and provided written recommended serving sizes specifically determined for patient's current nutritional status.   24 Hour Recall B: boiled egg, toast, grape juice L: bologna sandwich w/ lettuce and tomato and grape juice D: mashed potatoes (from a bag) and green beans Snacks: apples, grapes  Explained why diet restrictions are needed and provided lists of foods to limit/avoid that are high potassium, sodium, and phosphorus. Provided specific recommendations on safer alternatives of these foods. Strongly encouraged compliance of this diet.   Discussed importance of protein intake at each meal and snack. Provided examples of how to maximize protein intake throughout the day. Discussed need for fluid restriction with dialysis, importance of minimizing weight gain between HD treatments, and renal-friendly beverage options.  She endorses not preferring animal sources of protein. Does drink 2-3 Ensures per day. Encouraged continued supplementation of protein as this is needed to avoid loss of lean body mass and to prevent skin breakdown. She reports no issues with chewing or swallowing. Has had a notable weight loss, per her report, likely r/t fluid removal. Of note, this is not observed on her weight history, per chart review.   Encouraged pt to discuss specific diet questions/concerns with RD at HD outpatient facility.  Expect adequate compliance.  Body mass index is 27.3 kg/m. Pt meets criteria for slightly overweight for age based on current BMI. Of note, she was with some labored breathing, suggestive of fluid retention. Will likely see weight loss to within desirable range for age as she is challenged down during dialysis.   Net IO Since Admission: -8,450.34 mL [12/05/23 1020]   Current diet order is  renal, patient is consuming approximately 50%% of meals at  this time. Labs and medications reviewed. No further nutrition interventions warranted at this time. RD contact information provided. If additional nutrition issues arise, please re-consult RD.  Blair Deaner MS, RD, LDN Registered Dietitian Clinical Nutrition RD Inpatient Contact Info in Amion

## 2023-12-05 NOTE — Telephone Encounter (Signed)
 Copied from CRM [id number]. Topic: Clinical - Medication Refill >> Dec 05, 2023  1:13 PM Keana M wrote: Medication: amLODipine  (NORVASC ) 10 MG tablet  Has the patient contacted their pharmacy? Yes (Agent: If no, request that the patient contact the pharmacy for the refill. If patient does not wish to contact the pharmacy document the reason why and proceed with request.) (Agent: If yes, when and what did the pharmacy advise?)  This is the patient's preferred pharmacy:  [id number] [place] - [address] [place] [address] Phone: [phone] Fax: [phone]  Is this the correct pharmacy for this prescription? Yes If no, delete pharmacy and type the correct one.   Has the prescription been filled recently? No  Is the patient out of the medication? Yes  Has the patient been seen for an appointment in the last year OR does the patient have an upcoming appointment? Yes  Can we respond through MyChart? Yes  Agent: Please be advised that Rx refills may take up to 3 business days. We ask that you follow-up with your pharmacy.

## 2023-12-05 NOTE — Plan of Care (Signed)
 Pt has rested quietly throughout the night with no distress noted. Alert and oriented. On room air. SR on the monitor. Purewick intact to suction. Medicated for sleep and pain with relief noted. Went to dialysis during night. No complaints voiced. Up in chair for breakfast.     Problem: Education: Goal: Knowledge of General Education information will improve Description: Including pain rating scale, medication(s)/side effects and non-pharmacologic comfort measures Outcome: Progressing   Problem: Clinical Measurements: Goal: Respiratory complications will improve Outcome: Progressing Goal: Cardiovascular complication will be avoided Outcome: Progressing   Problem: Pain Managment: Goal: General experience of comfort will improve and/or be controlled Outcome: Progressing

## 2023-12-05 NOTE — TOC Transition Note (Signed)
 Transition of Care [facility]) - Discharge Note   Patient Details  Name: SYRITA DOVEL MRN: [id number] Date of Birth: 05/27/1941  Transition of [facility]) CM/SW Contact:  Hendricks KANDICE Her, RN Phone Number: 12/05/2023, 10:21 AM   Clinical Narrative:     Patient will DC to home today. Home Health PT and OT will be provided by [facility]. No DME needed  Son will transport home AVS updated           Patient Goals and CMS Choice            Discharge Placement                       Discharge Plan and Services Additional resources added to the After Visit Summary for                                       Social Drivers of Health (SDOH) Interventions SDOH Screenings   Food Insecurity: No Food Insecurity (11/30/2023)  Housing: Low Risk  (11/30/2023)  Transportation Needs: No Transportation Needs (11/30/2023)  Utilities: Not At Risk (11/30/2023)  Alcohol Screen: Low Risk  (02/26/2023)  Depression (PHQ2-9): Medium Risk (11/25/2023)  Financial Resource Strain: Medium Risk (09/03/2023)  Physical Activity: Inactive (11/25/2023)  Social Connections: Socially Isolated (11/30/2023)  Stress: Stress Concern Present (11/25/2023)  Tobacco Use: Medium Risk (12/04/2023)  Health Literacy: Adequate Health Literacy (02/26/2023)     Readmission Risk Interventions    08/13/2022   11:38 AM  Readmission Risk Prevention Plan  Transportation Screening Complete  PCP or Specialist Appt within 3-5 Days Complete  HRI or Home Care Consult Complete  Social Work Consult for Recovery Care Planning/Counseling Complete  Palliative Care Screening Not Applicable  Medication Review Ocean[facility]) Complete

## 2023-12-05 NOTE — Progress Notes (Addendum)
 DISCHARGE NOTE HOME Alexandra [name] to be discharged Home per MD order. Discussed prescriptions and follow up appointments with the patient. Prescriptions given to patient; medication list explained in detail. Patient verbalized understanding.  Skin clean, dry and intact without evidence of skin break down, no evidence of skin tears noted. IV catheter discontinued intact. Site without signs and symptoms of complications. Dressing and pressure applied. Pt denies pain at the site currently. No complaints noted.  Patient free of lines, drains, and wounds.   An After Visit Summary (AVS) was printed and given to the patient. Patient escorted via wheelchair, and discharged home via private auto.  Alexandra SHAUNNA Pepper, RN

## 2023-12-05 NOTE — Discharge Planning (Signed)
 [facility]  Initial Hemodialysis Orders Dialysis center: [place]  Patient's name: Alexandra [name] DOB: Nov 23, 1940 ESRD  Discharge diagnosis: Progressive CKD. 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 Serene   In Center Medications: Heparin  Dose: 2000 U Bolus  VDRA: Calcitriol 0.25 mcg  q HD Mircera: --Hold ESA 2/2 bladder mass/cancer    Discharge labs: Hgb: 8.8 K+: 3.8   Ca: 6.2       Phos: 3.9 Alb: 1.7  Please draw routine monthly labs on arrival.   Maisie Fellows Jazziel Fitzsimmons PA-C

## 2023-12-05 NOTE — Progress Notes (Signed)
 Received patient in bed to unit.  Alert and oriented.  Informed consent signed and in chart. TX duration: 3.30 hr   Patient tolerated well.  Transported back to the room Alert, without acute distress.  Hand-off given to patient's nurse.   Access used: HD Cathether  Access issues: none  Total UF removed:  Medication(s) given: none  Post HD VS: see table  Post HD weight: UTA    12/05/23 0240  Vitals  Temp 98.2 F (36.8 C)  Temp Source Oral  BP (!) 112/52  MAP (mmHg) 70  BP Location Right Arm  BP Method Automatic  Patient Position (if appropriate) Lying  Pulse Rate 89  Pulse Rate Source Monitor  ECG Heart Rate 91  Resp (!) 25  Oxygen  Therapy  SpO2 94 %  O2 Device Room Air  Pulse Oximetry Type Continuous  During Treatment Monitoring  Blood Flow Rate (mL/min) 199 mL/min  Arterial Pressure (mmHg) -91.31 mmHg  Venous Pressure (mmHg) 75.55 mmHg  TMP (mmHg) -0.8 mmHg  Ultrafiltration Rate (mL/min) 841 mL/min  Dialysate Flow Rate (mL/min) 300 ml/min  Dialysate Potassium Concentration 3  Dialysate Calcium  Concentration 2.5  Duration of HD Treatment -hour(s) 3.5 hour(s)  Cumulative Fluid Removed (mL) per Treatment  2000.17  HD Safety Checks Performed Yes  Intra-Hemodialysis Comments Tx completed;Tolerated well  Post Treatment  Dialyzer Clearance Lightly streaked  Hemodialysis Intake (mL) 0 mL  Liters Processed 63  Fluid Removed (mL) 2000 mL  Tolerated HD Treatment Yes  Post-Hemodialysis Comments PT tolerated HD, AxO x4  Hemodialysis Catheter Right Internal jugular Double lumen Permanent (Tunneled)  Placement Date/Time: 12/04/23 1510   Placed prior to admission: No  Serial / Lot #: [id number]  Expiration Date: 11/09/27  Time Out: Correct patient;Correct site;Correct procedure  Maximum sterile barrier precautions: Hand hygiene;Cap;Mask;Sterile gown;...  Site Condition No complications  Blue Lumen Status Flushed;Dead end cap in place;Blood return noted;Heparin  locked   Red Lumen Status Flushed;Dead end cap in place;Blood return noted;Heparin  locked  Purple Lumen Status N/A  Catheter fill solution Heparin  1000 units/ml  Catheter fill volume (Arterial) 1.9 cc  Catheter fill volume (Venous) 1.9  Dressing Type Transparent  Dressing Status Antimicrobial disc/dressing in place;Clean, Dry, Intact  Interventions Antimicrobial disc changed;Dressing changed  Drainage Description None  Dressing Change Due 12/12/23  Post treatment catheter status Capped and Clamped     Doretha Goding E. Heitor Steinhoff, BSN, RN Kidney Dialysis Unit

## 2023-12-06 ENCOUNTER — Telehealth: Payer: Self-pay

## 2023-12-06 NOTE — Transitions of Care (Post Inpatient/ED Visit) (Signed)
   12/06/2023  Name: KIMI BORDEAU MRN: [id number] DOB: 1940/07/05  Today's TOC FU Call Status: Today's TOC FU Call Status:: Unsuccessful Call (1st Attempt) Unsuccessful Call (1st Attempt) Date: 12/06/23  Attempted to reach the patient regarding the most recent Inpatient/ED visit.  Follow Up Plan: Additional outreach attempts will be made to reach the patient to complete the Transitions of Care (Post Inpatient/ED visit) call.   Shona Prow RN, CCM [place]  VBCI-Population Health RN Care Manager [phone]

## 2023-12-09 ENCOUNTER — Telehealth: Payer: Self-pay | Admitting: *Deleted

## 2023-12-09 NOTE — Transitions of Care (Post Inpatient/ED Visit) (Signed)
   12/09/2023  Name: Alexandra [name] MRN: [id number] DOB: 30-Sep-1940  Today's TOC FU Call Status: Today's TOC FU Call Status:: Unsuccessful Call (2nd Attempt) Unsuccessful Call (2nd Attempt) Date: 12/09/23  Attempted to reach the patient regarding the most recent Inpatient/ED visit. Per patient she is in dialysis and like a call back tomorrow Follow Up Plan: Additional outreach attempts will be made to reach the patient to complete the Transitions of Care (Post Inpatient/ED visit) call.   Cathlean Headland BSN RN [facility] Health Care Management Coordinator [email or web address] Direct Dial: [phone]  Fax: [phone] Website: [email or web address]

## 2023-12-10 ENCOUNTER — Telehealth: Payer: Self-pay | Admitting: *Deleted

## 2023-12-10 NOTE — Transitions of Care (Post Inpatient/ED Visit) (Signed)
   12/10/2023  Name: KUMIKO FISHMAN MRN: [id number] DOB: [birth year]  Today's TOC FU Call Status: Today's TOC FU Call Status:: Unsuccessful Call (2nd Attempt) Unsuccessful Call (2nd Attempt) Date: 12/10/23  Attempted to reach the patient regarding the most recent Inpatient/ED visit. Daughter Rock Sayres is to call me back Follow Up Plan: Additional outreach attempts will be made to reach the patient to complete the Transitions of Care (Post Inpatient/ED visit) call.   Cathlean Headland BSN RN [facility] [facility] Health Care Management Coordinator [email or web address] Direct Dial: [phone]  Fax: [phone] Website: [email or web address]

## 2023-12-11 ENCOUNTER — Telehealth: Payer: Self-pay | Admitting: *Deleted

## 2023-12-11 NOTE — Transitions of Care (Post Inpatient/ED Visit) (Signed)
 12/11/2023  Name: Alexandra [name] MRN: [id number] DOB: 15-Jan-1941  Today's TOC FU Call Status: Today's TOC FU Call Status:: Successful TOC FU Call Completed TOC FU Call Complete Date: 12/11/23 Patient's Name and Date of Birth confirmed.  Transition Care Management Follow-up Telephone Call Date of Discharge: 12/05/23 Discharge Facility: [facility]) Type of Discharge: Inpatient Admission Primary Inpatient Discharge Diagnosis:: Acute renal failure superimposed on stage 4 chronic kidney disease How have you been since you were released from the hospital?: Better Any questions or concerns?: Yes Patient Questions/Concerns:: Daughter wanted the appointments to follow up on. Patient Questions/Concerns Addressed: Other: (Rn went over discharge apoointments to be scheduled)  Items Reviewed: Did you receive and understand the discharge instructions provided?: Yes Medications obtained,verified, and reconciled?: Yes (Medications Reviewed) Any new allergies since your discharge?: No Dietary orders reviewed?: No Do you have support at home?: Yes People in Home [RPT]: alone Name of Support/Comfort Primary Source: Rock daughter  Medications Reviewed Today: Medications Reviewed Today     Reviewed by Kennieth Cathlean DEL, RN (Case Manager) on 12/11/23 at 1406  Med List Status: <None>   Medication Order Taking? Sig Documenting Provider Last Dose Status Informant  acetaminophen  (TYLENOL ) 500 MG tablet [id number] Yes Take 1,000 mg by mouth every 6 (six) hours as needed for mild pain (pain score 1-3) or moderate pain (pain score 4-6). [provider]  Active Self, Pharmacy Records  amLODipine  (NORVASC ) 10 MG tablet [id number] Yes Take 1 tablet (10 mg total) by mouth daily. TAKE 1 TABLET(10 MG) BY MOUTH DAILY Cox, Kirsten, MD  Active   amoxicillin  (AMOXIL ) 500 MG capsule [id number] Yes Take 1 capsule (500 mg total) by mouth daily. Take after hemodialysis on hemodialysis days Raenelle Donalda CHRISTELLA,  MD  Active   atorvastatin  (LIPITOR) 40 MG tablet [id number] Yes TAKE 1 TABLET BY MOUTH EVERY DAY Cox, Kirsten, MD  Active Self, Pharmacy Records  calcium  carbonate (TUMS - DOSED IN MG ELEMENTAL CALCIUM ) 500 MG chewable tablet [id number] Yes Chew 4 tablets (800 mg of elemental calcium  total) by mouth at bedtime. Raenelle Donalda CHRISTELLA, MD  Active   Cholecalciferol (VITAMIN D) 125 MCG (5000 UT) CAPS [id number] Yes Take 1 capsule by mouth in the morning. [provider]  Active Self, Pharmacy Records           Med Note EFRAIM, ALFREIDA CROME   Fri Nov 29, 2023  4:33 PM) Patient is out of this medication  cyanocobalamin  (VITAMIN B12) 1000 MCG/ML injection [id number] Yes Inject 1 mL (1,000 mcg total) into the muscle every 14 (fourteen) days. Sherre Clapper, MD  Active Self, Pharmacy Records           Med Note [facility], ALFREIDA CROME   Fri Nov 29, 2023  4:34 PM) Patient is due for a shot now, stated it has been about 6 weeks.  ferrous sulfate  325 (65 FE) MG tablet [id number] Yes Take 1 tablet (325 mg total) by mouth daily. Teressa Harrie CHRISTELLA, FNP  Active Self, Pharmacy Records  levothyroxine  (SYNTHROID ) 100 MCG tablet [id number] Yes Take 1 tablet (100 mcg total) by mouth daily. Teressa Harrie CHRISTELLA, FNP  Active Self, Pharmacy Records  nitroGLYCERIN  (NITROSTAT ) 0.4 MG SL tablet [id number] Yes Place 1 tablet (0.4 mg total) under the tongue every 5 (five) minutes as needed for chest pain. CoxClapper, MD  Active Self, Pharmacy Records           Med Note EFRAIM, ALFREIDA CROME   Fri Nov 29, 2023  4:06  PM)    ondansetron  (ZOFRAN -ODT) 4 MG disintegrating tablet [id number] Yes Take 1 tablet (4 mg total) by mouth every 8 (eight) hours as needed for nausea or vomiting. Sirivol, Mamatha, MD  Active Self, Pharmacy Records  oxyCODONE  (OXY IR/ROXICODONE ) 5 MG immediate release tablet [id number] Yes Take 5 mg by mouth every 6 (six) hours as needed. [provider]  Active Self, Pharmacy Records  rOPINIRole  (REQUIP ) 2 MG tablet  [id number] Yes TAKE 1 TABLET(2 MG) BY MOUTH TWICE DAILY Cox, Kirsten, MD  Active Self, Pharmacy Records  VENTOLIN  HFA 108 [phone] Base) MCG/ACT inhaler [id number] Yes Inhale 1-2 puffs into the lungs every 6 (six) hours as needed for wheezing or shortness of breath. Cox, Kirsten, MD  Active Self, Pharmacy Records           Med Note [facility]mal, DOROTHYANN ONEIDA Debar Jan 08, 2023  3:05 PM)              Home Care and Equipment/Supplies: Were Home Health Services Ordered?: Yes Name of Home Health Agency:: [facility] Has Agency set up a time to come to your home?: Yes First Home Health Visit Date: 12/10/23 Any new equipment or medical supplies ordered?: NA  Functional Questionnaire: Do you need assistance with bathing/showering or dressing?: No Do you need assistance with meal preparation?: Yes Do you need assistance with eating?: No Do you have difficulty maintaining continence: No Do you need assistance with getting out of bed/getting out of a chair/moving?: No Do you have difficulty managing or taking your medications?: No  Follow up appointments reviewed: PCP Follow-up appointment confirmed?: No MD Provider Line Number:[phone] Given: Yes (Daughter will schedule around patient dialysis and transportation) Specialist Hospital Follow-up appointment confirmed?: Yes Date of Specialist follow-up appointment?: 01/13/24 Follow-Up Specialty Provider:: Vascular vein sppecialist clinic Reason Specialist Follow-Up Not Confirmed: Patient has Specialist Provider Number and will Call for Appointment (Daughter is scheduling around other appointments and transportation) Do you need transportation to your follow-up appointment?: No Do you understand care options if your condition(s) worsen?: Yes-patient verbalized understanding  SDOH Interventions Today    Flowsheet Row Most Recent Value  SDOH Interventions   Food Insecurity Interventions Intervention Not Indicated  Housing Interventions Intervention Not  Indicated  Transportation Interventions Intervention Not Indicated, Patient Resources (Friends/Family)  Utilities Interventions Intervention Not Indicated  Depression Interventions/Treatment  Counseling    Goals Addressed             This Visit's Progress    VBCI Transitions of Care (TOC) Care Plan       Problems:  Recent Hospitalization for treatment of ESRD Knowledge Deficit Related to Acute renal failure superimposed on stage 4 chronic kidney disease, No Hospital Follow Up Provider appointment Patient daughter stated she will make appointments around dialysis and transportation, and problems related to transportation home from  Dialysis. Patient daughter is discussing with dialysis center to do dialysis earlier than her scheduled 1130 chair time. The [facility] stop picking up at 2:00 and her dialysis is over at 3:00  Goal:  Over the next 30 days, the patient will not experience hospital readmission  Interventions:    Chronic Kidney Disease Interventions: Evaluation of current treatment plan related to chronic kidney disease self management and patient's adherence to plan as established by provider      Discussed plans with patient for ongoing care management follow up and provided patient with direct contact information for care management team    Assessed social determinant of health barriers  Last practice recorded BP readings:  BP Readings from Last 3 Encounters:  12/05/23 (!) 95/47  11/28/23 128/78  11/25/23 (!) 140/90   Most recent eGFR/CrCl:  Lab Results  Component Value Date   EGFR 5 (L) 11/28/2023    No components found for: CRCL  Patient Self Care Activities:  Attend all scheduled provider appointments Call pharmacy for medication refills 3-7 days in advance of running out of medications Call provider office for new concerns or questions  Notify RN Care Manager of TOC call rescheduling needs Participate in Transition of Care Program/Attend TOC scheduled  calls Take medications as prescribed    Plan:  An initial telephone outreach has been scheduled for: [id number] Telephone follow up appointment with care management team member scheduled for:  [id number] Alan Ee 11:00      RN gave patient daughter information on [facility]. Vans run Monday through Friday, 9:00 am to 2:00 pm. There is a $3 charge per trip outside of the [place] city ($6 round trip). They request three business days advance notice of any transportation needs to ensure scheduling. Daughter will make Dr Marda and Dr Sherre appointments due to dialysis and  transportation  The patient has been provided with contact information for the care management team and has been advised to call with any health-related questions or concerns. The patient verbalized understanding with current POC. The patient is directed to their insurance card regarding availability of benefits coverage   Cathlean Headland BSN RN [facility] Care Management Coordinator [email or web address] Direct Dial: [phone]  Fax: [phone] Website: [email or web address]

## 2023-12-12 ENCOUNTER — Encounter: Payer: Self-pay | Admitting: Family Medicine

## 2023-12-12 ENCOUNTER — Ambulatory Visit (INDEPENDENT_AMBULATORY_CARE_PROVIDER_SITE_OTHER): Admitting: Family Medicine

## 2023-12-12 ENCOUNTER — Telehealth: Payer: Self-pay

## 2023-12-12 VITALS — BP 130/70 | HR 96 | Temp 97.4°F | Ht 62.0 in | Wt 136.0 lb

## 2023-12-12 DIAGNOSIS — D72828 Other elevated white blood cell count: Secondary | ICD-10-CM | POA: Diagnosis not present

## 2023-12-12 DIAGNOSIS — S2241XD Multiple fractures of ribs, right side, subsequent encounter for fracture with routine healing: Secondary | ICD-10-CM

## 2023-12-12 DIAGNOSIS — R102 Pelvic and perineal pain: Secondary | ICD-10-CM

## 2023-12-12 DIAGNOSIS — N178 Other acute kidney failure: Secondary | ICD-10-CM

## 2023-12-12 DIAGNOSIS — N184 Chronic kidney disease, stage 4 (severe): Secondary | ICD-10-CM

## 2023-12-12 DIAGNOSIS — K5909 Other constipation: Secondary | ICD-10-CM | POA: Diagnosis not present

## 2023-12-12 NOTE — Telephone Encounter (Signed)
 Copied from CRM [id number]. Topic: Clinical - Home Health Verbal Orders >> Dec 12, 2023  8:38 AM Wyona P wrote: Caller/Agency: Alan Clinical Manager - [facility]  Callback Number: [phone] ( secured Line)  Service Requested: Physical Therapy Frequency: 1 once a week for 9 weeks  Any new concerns about the patient? Yes

## 2023-12-12 NOTE — Assessment & Plan Note (Signed)
 Sensation of prolapse when standing, possibly related to previous vaginal tack procedure. Differential includes bladder issues or stent related to renal function. Urology appointment scheduled. - Visual inspection of the vaginal wall showed no signs of prolapse - Await urology assessment on the 15th. - Consider gynecological referral if urology assessment is inconclusive.

## 2023-12-12 NOTE — Assessment & Plan Note (Signed)
 Hospital follow-up  Previous leukocytosis - labs drawn today    Latest Ref Rng & Units 12/04/2023   11:08 PM 12/04/2023    3:28 AM 12/03/2023    5:34 AM  CBC  WBC 4.0 - 10.5 K/uL 17.8  19.2  16.5   Hemoglobin 12.0 - 15.0 g/dL 8.8  7.1  7.3   Hematocrit 36.0 - 46.0 % 28.9  23.3  23.8   Platelets 150 - 400 K/uL 340  252  330

## 2023-12-12 NOTE — Patient Instructions (Signed)
  VISIT SUMMARY: Today, we discussed your rib pain, pelvic pressure, dialysis, constipation, and hip pain. We reviewed your recent experiences and upcoming appointments to ensure comprehensive care.  YOUR PLAN: -CHRONIC KIDNEY DISEASE WITH DIALYSIS: Chronic kidney disease means your kidneys are not working as well as they should. You have started dialysis to help filter your blood. Although the sessions are long and uncomfortable, you are making progress. We will check your blood work to monitor your kidney and liver function.  -PELVIC PRESSURE: Pelvic pressure can feel like something is falling out, often related to bladder issues or previous surgeries. You have an upcoming urology appointment to assess this further. If needed, we may refer you to a gynecologist.  -CONSTIPATION: Constipation means having difficulty with bowel movements. You are taking Miralax  daily and eating fruits, especially during dialysis, to help manage this.  -HIP PAIN: Your hip pain has improved after an injection, and you are no longer experiencing significant discomfort.  INSTRUCTIONS: Please attend your urology appointment on July 15th and your cardiology appointment on July 18th. Reschedule your vein specialist appointment from August 4th due to a conflict with your dialysis schedule. Also, prepare for your annual wellness visit with Dr. Sherre in September.                      Contains text generated by [employer]bridge.                                 Contains text generated by [employer]bridge.

## 2023-12-12 NOTE — Assessment & Plan Note (Addendum)
 Bruising noted on exam, still sore - healing well, allow 6-8 weeks for them to heal completely - repeat xrays have been completed according to the patient - continue to monitor closely and contact the office with any concerns

## 2023-12-12 NOTE — Assessment & Plan Note (Signed)
 Hospital Follow-up  Admit date 11/29/23  Discharge date 12/05/23 - Recently started dialysis, fifth session completed. Reports improvement but sessions are lengthy and uncomfortable. Dry skin noted, likely due to renal function. - Order CBC and CMP to evaluate renal and hepatic function.

## 2023-12-12 NOTE — Progress Notes (Signed)
 Subjective:  Patient ID: Alexandra [name], female    DOB: [birth year]  Age: 83 y.o. MRN: [id number]  Chief Complaint  Patient presents with   Rib Injury   Chronic Kidney Disease   Constipation   Pelvic Pressure    Discussed the use of AI scribe software for clinical note transcription with the patient, who gave verbal consent to proceed.  History of Present Illness   Alexandra [name] is an 83 year old female who presents for hospital follow-up of rib pain, recent start of dialysis, pelvic pressure, and constipation. Patient was seen at ED at [facility]  on 11/29/23. She was admitted  to [facility] on 11/29/23 to 12/05/23.   Acute Renal Failure Stage 4 CKD - Dialysis  - Recently started dialysis; fifth session completed yesterday. Attends Dialysis MWF - Difficulty tolerating four-hour sessions, but has a TV to watch during treatment  Rib pain (fracture from fall at the hospital) - Pain is improving but occasionally becomes sore - Initial pain was severe following a fall down the stairs, described as worse than childbirth - Follow-up rib x-rays have been performed  Pelvic pressure and prolapse symptoms - Sensation of something 'falling out' when standing, associated with bladder - History of vaginal tack procedure performed by gynecology approximately 7 to 10 years ago - Ureteral stent changed every three months; due for a change soon - Significant pressure when sitting, resolves when lying down - No dysuria, urinary frequency, or urgency  Constipation - Experiencing constipation - Takes Miralax  daily, mixed with coffee. States that it doesn't taste bad.   Hip pain - Hip pain has improved following an injection - No longer experiencing significant discomfort  Care coordination and upcoming appointments - Cardiology appointment scheduled for July 18th - Urology appointment scheduled for July 15th - Vein specialist appointment needs to be rescheduled due to conflict with  dialysis schedule          12/11/2023    2:29 PM 11/25/2023    9:12 AM 11/14/2023    7:30 AM 02/26/2023    9:17 AM 01/25/2023    9:34 AM  Depression screen PHQ 2/9  Decreased Interest 1 1 0 0 0  Down, Depressed, Hopeless 1 1 0 0 0  PHQ - 2 Score 2 2 0 0 0  Altered sleeping 1 1  0 0  Tired, decreased energy 1 1  0 0  Change in appetite 1 1  0 0  Feeling bad or failure about yourself  1 1  0 0  Trouble concentrating 1 1  0 0  Moving slowly or fidgety/restless 1 1  0 0  Suicidal thoughts 0 0  0 0  PHQ-9 Score 8 8  0 0  Difficult doing work/chores Somewhat difficult Somewhat difficult  Not difficult at all Not difficult at all        11/28/2023    7:42 AM  Fall Risk   Falls in the past year? 1  Comment Fell at [facility] tripped over her shoe. Broke 3 ribs, fractured pelvis. Head CT normal.  Number falls in past yr: 0  Injury with Fall? 1  Risk for fall due to : Impaired balance/gait  Follow up Falls evaluation completed    Patient Care Team: Sherre Clapper, MD as PCP - General (Internal Medicine) Bernie Guillermina BROCKS, MD (Inactive) as Consulting Physician (Internal Medicine) Gearline Norris, MD as Consulting Physician (Nephrology) Marda General, MD as Consulting Physician (Urology) Monetta Redell PARAS, MD as  Consulting Physician (Cardiology) Beverli Cara PARAS, [facility] (Inactive) (Pharmacist) Frances Ozell RAMAN, LCSW as [facility] Care Management (Licensed Clinical Social Worker) Rumalda Alan PENNER, RN as [facility] Care Management   Review of Systems  Constitutional:  Negative for chills, fatigue and fever.  HENT:  Negative for congestion, ear pain and sore throat.   Respiratory:  Negative for cough and shortness of breath.   Cardiovascular:  Negative for chest pain and palpitations.  Gastrointestinal:  Positive for constipation. Negative for abdominal pain, diarrhea, nausea and vomiting.  Endocrine: Negative for polydipsia, polyphagia and polyuria.  Genitourinary:  Negative for difficulty urinating and  dysuria.  Musculoskeletal:  Negative for arthralgias, back pain and myalgias.  Skin:  Negative for rash.  Neurological:  Negative for headaches.  Psychiatric/Behavioral:  Negative for dysphoric mood. The patient is not nervous/anxious.     Current Outpatient Medications on File Prior to Visit  Medication Sig Dispense Refill   acetaminophen  (TYLENOL ) 500 MG tablet Take 1,000 mg by mouth every 6 (six) hours as needed for mild pain (pain score 1-3) or moderate pain (pain score 4-6).     amLODipine  (NORVASC ) 10 MG tablet Take 1 tablet (10 mg total) by mouth daily. TAKE 1 TABLET(10 MG) BY MOUTH DAILY 90 tablet 1   atorvastatin  (LIPITOR) 40 MG tablet TAKE 1 TABLET BY MOUTH EVERY DAY 90 tablet 1   Cholecalciferol (VITAMIN D) 125 MCG (5000 UT) CAPS Take 1 capsule by mouth in the morning.     levothyroxine  (SYNTHROID ) 100 MCG tablet Take 1 tablet (100 mcg total) by mouth daily. 90 tablet 0   nitroGLYCERIN  (NITROSTAT ) 0.4 MG SL tablet Place 1 tablet (0.4 mg total) under the tongue every 5 (five) minutes as needed for chest pain. 30 tablet 3   ondansetron  (ZOFRAN -ODT) 4 MG disintegrating tablet Take 1 tablet (4 mg total) by mouth every 8 (eight) hours as needed for nausea or vomiting. 20 tablet 1   rOPINIRole  (REQUIP ) 2 MG tablet TAKE 1 TABLET(2 MG) BY MOUTH TWICE DAILY 60 tablet 2   VENTOLIN  HFA 108 (90 Base) MCG/ACT inhaler Inhale 1-2 puffs into the lungs every 6 (six) hours as needed for wheezing or shortness of breath. 18 g 2   amoxicillin  (AMOXIL ) 500 MG capsule Take 1 capsule (500 mg total) by mouth daily. Take after hemodialysis on hemodialysis days 6 capsule 0   calcium  carbonate (TUMS - DOSED IN MG ELEMENTAL CALCIUM ) 500 MG chewable tablet Chew 4 tablets (800 mg of elemental calcium  total) by mouth at bedtime. 120 tablet 1   cyanocobalamin  (VITAMIN B12) 1000 MCG/ML injection Inject 1 mL (1,000 mcg total) into the muscle every 14 (fourteen) days. 4 mL 1   ferrous sulfate  325 (65 FE) MG tablet Take  1 tablet (325 mg total) by mouth daily. 90 tablet 0   oxyCODONE  (OXY IR/ROXICODONE ) 5 MG immediate release tablet Take 5 mg by mouth every 6 (six) hours as needed.     No current facility-administered medications on file prior to visit.   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 83 yo   AV FISTULA PLACEMENT Left 12/04/2023   Procedure: BRACHIAL BASILIC  (AV) FISTULA CREATION;  Surgeon: Serene Gaile ORN, MD;  Location: [facility] OR;  Service: Vascular;  Laterality: Left;   BACK SURGERY  2016   lower L 4 to L 5   bladder tack and uterus lift  10/10/2015   CHOLECYSTECTOMY     summer of 2022   CYSTOSCOPY WITH RETROGRADE PYELOGRAM, URETEROSCOPY AND STENT PLACEMENT Right 08/04/2022   Procedure: CYSTOSCOPY WITH RIGHT URETERAL STENT EXCHANGE;  Surgeon: Lovie Arlyss CROME, MD;  Location: [facility] OR;  Service: Urology;  Laterality: Right;   INSERTION OF DIALYSIS CATHETER N/A 12/04/2023   Procedure: INSERTION OF DIALYSIS CATHETER, REMOVAL OF PREVIOUS DIALYSIS CATHETER;  Surgeon: Serene Gaile ORN, MD;  Location: [facility] OR;  Service: Vascular;  Laterality: N/A;   IR RADIOLOGIST EVAL & MGMT  12/03/2018   IR RADIOLOGIST EVAL & MGMT  12/31/2018   IR REMOVAL TUN ACCESS W/ PORT W/O FL MOD SED  08/08/2022   PARATHYROIDECTOMY Left 07/02/2016   Procedure: LEFT INFERIOR PARATHYROIDECTOMY;  Surgeon: Krystal Spinner, MD;  Location: [facility] ORS;  Service: General;  Laterality: Left;   SHOULDER OPEN ROTATOR CUFF REPAIR  12/2019   TEE WITHOUT CARDIOVERSION N/A 08/10/2022    Procedure: TRANSESOPHAGEAL ECHOCARDIOGRAM (TEE);  Surgeon: Mona Vinie BROCKS, MD;  Location: [facility] ENDOSCOPY;  Service: Cardiovascular;  Laterality: N/A;   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    Family History  Problem Relation Age of Onset   Emphysema Mother    Hyperlipidemia Sister    Hypertension Sister    COPD Sister    Hyperlipidemia Sister    Hypertension Sister    Lymphoma Sister    Obesity Brother    Hyperlipidemia Brother    Hypertension Brother    Breast cancer Neg Hx    Social History   Socioeconomic History   Marital status: Married    Spouse name: Lamar   Number of children: 2   Years of education: 11   Highest education level: 11th grade  Occupational History   Occupation: Retired  Tobacco Use   Smoking status: Former    Current packs/day: 0.00    Average packs/day: 1 pack/day for 50.0 years (50.0 ttl pk-yrs)    Types: Cigarettes    Start date: 06/12/1955    Quit date: 06/11/2005    Years since quitting: 18.5   Smokeless tobacco: Never  Vaping Use   Vaping status: Never Used  Substance and Sexual Activity   Alcohol use: No   Drug use: No   Sexual activity: Yes    Partners: Male  Other Topics Concern   Not on file  Social History Narrative   Not on file   Social Drivers of Health   Financial Resource Strain: Medium Risk (09/03/2023)   Overall Financial Resource Strain (CARDIA)    Difficulty of Paying Living Expenses: Somewhat hard  Food Insecurity: No Food Insecurity (12/11/2023)   Hunger Vital Sign    Worried About Running Out of Food in the Last Year: Never true    Ran Out of Food in the Last Year: Never true  Transportation Needs: No Transportation Needs (12/11/2023)   PRAPARE - Administrator, Civil Service (Medical): No    Lack of Transportation (Non-Medical): No  Physical Activity: Inactive (11/25/2023)   Exercise Vital Sign    Days of Exercise per Week: 0 days    Minutes of Exercise  per Session: 0 min  Stress: Stress Concern  Present (11/25/2023)   [employer] of Occupational Health - Occupational Stress Questionnaire    Feeling of Stress: To some extent  Social Connections: Socially Isolated (11/30/2023)   Social Connection and Isolation Panel    Frequency of Communication with Friends and Family: Once a week    Frequency of Social Gatherings with Friends and Family: Once a week    Attends Religious Services: Never    Diplomatic Services operational officer: No    Attends Engineer, structural: Never    Marital Status: Married    Objective:  BP 130/70   Pulse 96   Temp (!) 97.4 F (36.3 C)   Ht 5' 2 (1.575 m)   Wt 136 lb (61.7 kg)   SpO2 94%   BMI 24.87 kg/m      12/12/2023    8:43 AM 12/05/2023    8:00 AM 12/05/2023    6:00 AM  BP/Weight  Systolic BP 130 95   Diastolic BP 70 47   Wt. (Lbs) 136  149.25  BMI 24.87 kg/m2  27.3 kg/m2    Physical Exam Vitals reviewed. Exam conducted with a chaperone present.  Constitutional:      General: She is not in acute distress.    Appearance: Normal appearance.  Eyes:     Conjunctiva/sclera: Conjunctivae normal.  Cardiovascular:     Rate and Rhythm: Normal rate and regular rhythm.     Heart sounds: Normal heart sounds. No murmur heard. Pulmonary:     Effort: Pulmonary effort is normal.     Breath sounds: Normal breath sounds. No wheezing.  Abdominal:     General: Bowel sounds are normal.     Palpations: Abdomen is soft.  Genitourinary:    Exam position: Lithotomy position.     Labia:        Right: No rash.        Left: No rash.      Urethra: No prolapse.     Vagina: Normal. No tenderness or prolapsed vaginal walls.  Skin:    General: Skin is warm and dry.     Findings: Bruising (right side) present.  Neurological:     Mental Status: She is alert. Mental status is at baseline.  Psychiatric:        Mood and Affect: Mood normal.        Behavior: Behavior normal.      Lab Results  Component Value Date   WBC 17.8 (H)  12/04/2023   HGB 8.8 (L) 12/04/2023   HCT 28.9 (L) 12/04/2023   PLT 340 12/04/2023   GLUCOSE 126 (H) 12/04/2023   CHOL 164 11/28/2023   TRIG 82 11/28/2023   HDL 63 11/28/2023   LDLCALC 86 11/28/2023   ALT 16 11/29/2023   AST 14 (L) 11/29/2023   NA 139 12/04/2023   K 3.8 12/04/2023   CL 110 12/04/2023   CREATININE 3.58 (H) 12/04/2023   BUN 51 (H) 12/04/2023   CO2 20 (L) 12/04/2023   TSH 4.350 07/16/2023   INR 1.1 08/04/2022   HGBA1C 5.3 05/31/2023      Assessment & Plan:  Acute renal failure with other specified pathological kidney lesion superimposed on stage 4 chronic kidney disease [facility]) Assessment & Plan: Hospital Follow-up  Admit date 11/29/23  Discharge date 12/05/23 - Recently started dialysis, fifth session completed. Reports improvement but sessions are lengthy and uncomfortable. Dry skin noted, likely due to renal function. -  Order CBC and CMP to evaluate renal and hepatic function.  Orders: -     Comprehensive metabolic panel with GFR  Other elevated white blood cell (WBC) count Assessment & Plan: Hospital follow-up  Previous leukocytosis - labs drawn today    Latest Ref Rng & Units 12/04/2023   11:08 PM 12/04/2023    3:28 AM 12/03/2023    5:34 AM  CBC  WBC 4.0 - 10.5 K/uL 17.8  19.2  16.5   Hemoglobin 12.0 - 15.0 g/dL 8.8  7.1  7.3   Hematocrit 36.0 - 46.0 % 28.9  23.3  23.8   Platelets 150 - 400 K/uL 340  252  330      Orders: -     CBC with Differential/Platelet  Closed fracture of multiple ribs of right side with routine healing, subsequent encounter Assessment & Plan: Bruising noted on exam, still sore - healing well, allow 6-8 weeks for them to heal completely - repeat xrays have been completed according to the patient - continue to monitor closely and contact the office with any concerns   Chronic constipation Assessment & Plan: Uses Miralax  daily as advised. Incorporates fruits into diet, especially during dialysis. - Continue current  medication management   Pelvic pressure in female Assessment & Plan: Sensation of prolapse when standing, possibly related to previous vaginal tack procedure. Differential includes bladder issues or stent related to renal function. Urology appointment scheduled. - Visual inspection of the vaginal wall showed no signs of prolapse - Await urology assessment on the 15th. - Consider gynecological referral if urology assessment is inconclusive.       Orders Placed This Encounter  Procedures   Comprehensive metabolic panel with GFR   CBC with Differential/Platelet     Follow-up: Return in about 11 weeks (around 02/27/2024) for awv.   Alexandra [name],acting as a scribe for Harrie CHRISTELLA Cedar, FNP.,have documented all relevant documentation on the behalf of Harrie CHRISTELLA Cedar, FNP,as directed by  Harrie CHRISTELLA Cedar, FNP while in the presence of Harrie CHRISTELLA Cedar, FNP.   An After Visit Summary was printed and given to the patient.  I attest that I have reviewed this visit and agree with the plan scribed by my staff.   Harrie CHRISTELLA Cedar, FNP [facility] [phone]

## 2023-12-12 NOTE — Assessment & Plan Note (Signed)
 Uses Miralax  daily as advised. Incorporates fruits into diet, especially during dialysis. - Continue current medication management

## 2023-12-13 LAB — CBC WITH DIFFERENTIAL/PLATELET
Basophils Absolute: 0.1 x10E3/uL (ref 0.0–0.2)
Basos: 0 %
EOS (ABSOLUTE): 0.1 x10E3/uL (ref 0.0–0.4)
Eos: 1 %
Hematocrit: 33.2 % — ABNORMAL LOW (ref 34.0–46.6)
Hemoglobin: 10.5 g/dL — ABNORMAL LOW (ref 11.1–15.9)
Immature Grans (Abs): 0.2 x10E3/uL — ABNORMAL HIGH (ref 0.0–0.1)
Immature Granulocytes: 1 %
Lymphocytes Absolute: 1.5 x10E3/uL (ref 0.7–3.1)
Lymphs: 9 %
MCH: 30.9 pg (ref 26.6–33.0)
MCHC: 31.6 g/dL (ref 31.5–35.7)
MCV: 98 fL — ABNORMAL HIGH (ref 79–97)
Monocytes Absolute: 1 x10E3/uL — ABNORMAL HIGH (ref 0.1–0.9)
Monocytes: 6 %
Neutrophils Absolute: 13 x10E3/uL — ABNORMAL HIGH (ref 1.4–7.0)
Neutrophils: 83 %
Platelets: 201 x10E3/uL (ref 150–450)
RBC: 3.4 x10E6/uL — ABNORMAL LOW (ref 3.77–5.28)
RDW: 14.4 % (ref 11.7–15.4)
WBC: 15.8 x10E3/uL — ABNORMAL HIGH (ref 3.4–10.8)

## 2023-12-13 LAB — COMPREHENSIVE METABOLIC PANEL WITH GFR
ALT: 5 IU/L (ref 0–32)
AST: 15 IU/L (ref 0–40)
Albumin: 3.9 g/dL (ref 3.8–4.8)
Alkaline Phosphatase: 120 IU/L (ref 44–121)
BUN/Creatinine Ratio: 9 — ABNORMAL LOW (ref 12–28)
BUN: 24 mg/dL (ref 8–27)
Bilirubin Total: 0.3 mg/dL (ref 0.0–1.2)
CO2: 26 mmol/L (ref 20–29)
Calcium: 8.6 mg/dL — ABNORMAL LOW (ref 8.7–10.3)
Chloride: 95 mmol/L — ABNORMAL LOW (ref 96–106)
Creatinine, Ser: 2.69 mg/dL — ABNORMAL HIGH (ref 0.57–1.00)
Globulin, Total: 2.8 g/dL (ref 1.5–4.5)
Glucose: 101 mg/dL — ABNORMAL HIGH (ref 70–99)
Potassium: 4.4 mmol/L (ref 3.5–5.2)
Sodium: 138 mmol/L (ref 134–144)
Total Protein: 6.7 g/dL (ref 6.0–8.5)
eGFR: 17 mL/min/1.73 — ABNORMAL LOW (ref >59)

## 2023-12-17 ENCOUNTER — Telehealth: Payer: Self-pay

## 2023-12-17 NOTE — Telephone Encounter (Signed)
 Returned call left vm providing verbal ok for orders requested below.  Copied from CRM [id number]. Topic: Clinical - Home Health Verbal Orders >> Dec 17, 2023  9:54 AM Richerd NOVAK wrote: Caller/Agency: Darryle from [facility] Callback Number: [phone]  Service Requested: Physical Therapy Frequency: 1x9 Any new concerns about the patient? yes

## 2023-12-18 ENCOUNTER — Ambulatory Visit: Payer: Self-pay | Admitting: Family Medicine

## 2023-12-18 ENCOUNTER — Telehealth: Payer: Self-pay

## 2023-12-18 NOTE — Telephone Encounter (Signed)
 Called Darryle and left voicemail to approved orders  Copied from CRM [id number]. Topic: Clinical - Home Health Verbal Orders >> Dec 17, 2023  9:54 AM Richerd NOVAK wrote: Caller/Agency: Darryle from [facility] Callback Number: [phone]  Service Requested: Physical Therapy Frequency: 1x9 Any new concerns about the patient? yes

## 2023-12-19 ENCOUNTER — Other Ambulatory Visit: Payer: Self-pay

## 2023-12-19 NOTE — Patient Instructions (Signed)
 Visit Information  Thank you for taking time to visit with me today. Please don't hesitate to contact me if I can be of assistance to you before our next scheduled telephone appointment.  Our next appointment is by telephone on 12/26/23 at 4pm  Following is a copy of your care plan:   Goals Addressed             This Visit's Progress    VBCI Transitions of Care (TOC) Care Plan       Problems:  Recent Hospitalization for treatment of ESRD Knowledge Deficit Related to Acute renal failure superimposed on stage 4 chronic kidney disease, No Hospital Follow Up Provider appointment Patient daughter stated she will make appointments around dialysis and transportation, and problems related to transportation home from  Dialysis. Patient daughter is discussing with dialysis center to do dialysis earlier than her scheduled 1130 chair time. The [facility] stop picking up at 2:00 and her dialysis is over at 3:00 - (Update 12/19/23: patient confirms dialysis time has been moved up to 10:30)  Goal:  Over the next 30 days, the patient will not experience hospital readmission  Interventions:    Chronic Kidney Disease Interventions: Evaluation of current treatment plan related to chronic kidney disease self management and patient's adherence to plan as established by provider      Discussed plans with patient for ongoing care management follow up and provided patient with direct contact information for care management team    Assessed social determinant of health barriers    Last practice recorded BP readings:  BP Readings from Last 3 Encounters:  12/05/23 (!) 95/47  11/28/23 128/78  11/25/23 (!) 140/90   Most recent eGFR/CrCl:  Lab Results  Component Value Date   EGFR 5 (L) 11/28/2023    No components found for: CRCL  Patient Self Care Activities:  Attend all scheduled provider appointments Call pharmacy for medication refills 3-7 days in advance of running out of medications Call provider  office for new concerns or questions  Notify RN Care Manager of TOC call rescheduling needs Participate in Transition of Care Program/Attend TOC scheduled calls Take medications as prescribed    Plan:  Telephone follow up appointment with care management team member scheduled for: 12/26/23 4pm        Patient verbalizes understanding of instructions and care plan provided today and agrees to view in MyChart. Active MyChart status and patient understanding of how to access instructions and care plan via MyChart confirmed with patient.     Telephone follow up appointment with care management team member scheduled for:12/26/23 4pm The patient has been provided with contact information for the care management team and has been advised to call with any health related questions or concerns.   Please call the care guide team at [phone] if you need to cancel or reschedule your appointment.   Please call the Suicide and Crisis Lifeline: 988 call 1-800-273-TALK (toll free, 24 hour hotline) call 911 if you are experiencing a Mental Health or Behavioral Health Crisis or need someone to talk to.  Shona Prow RN, CCM [place]  VBCI-Population Health RN Care Manager [phone]

## 2023-12-19 NOTE — Transitions of Care (Post Inpatient/ED Visit) (Signed)
 Transition of Care week 2  Visit Note  12/19/2023  Name: Alexandra [name] MRN: [id number]          DOB: August 15, 1940  Situation: Patient enrolled in [facility] 30-day program. Visit completed with patient by telephone.   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   Hypertension    Osteoporosis    Restless legs syndrome    RLS (restless legs syndrome)    SIRS (systemic inflammatory response syndrome) (HCC) 08/03/2022    Assessment: Patient Reported Symptoms: Cognitive Cognitive Status: No symptoms reported, Normal speech and language skills, Alert and oriented to person, place, and time      Neurological Neurological Review of Symptoms: No symptoms reported (patient currently denies any hearing issues at time of call)    HEENT HEENT Symptoms Reported:  (patient denies any hearing issues at time of call)      Cardiovascular Cardiovascular Symptoms Reported: No symptoms reported Cardiovascular Comment: patient reports she is weighed at dialysis  Respiratory Respiratory Symptoms Reported: No symptoms reported    Endocrine Endocrine Symptoms Reported: No symptoms reported    Gastrointestinal Gastrointestinal Symptoms Reported: Constipation Additional Gastrointestinal Details: patient reports she did not move her bowels the 6 days in the hospital and states she has moved her bowels since getting home but feels she needs to move more - reviewed warm prune juice and notifying TOC RN or MD if no good BM      Genitourinary Additional Genitourinary Details: ESRD on dialysis M,W,F now at 10:30 Hemodialysis Schedule: dialysis M,W,F now at 10:30  Integumentary Additional Integumentary Details: patient reports  port to right side of chest and denies issue and left arm fistula will be removed d/t not working    Musculoskeletal          Psychosocial Psychosocial Symptoms Reported: No symptoms reported         Vitals:   12/19/23 1538  BP: 136/80    Medications Reviewed Today     Reviewed by Lauro Shona LABOR, RN (Registered Nurse) on 12/19/23 at 1531  Med List Status: <None>   Medication Order Taking? Sig Documenting Provider Last Dose Status Informant  acetaminophen  (TYLENOL ) 500 MG tablet [id number] Yes Take 1,000 mg by mouth every 6 (six) hours as needed for mild pain (pain score 1-3) or moderate pain (pain score 4-6). [provider]  Active Self, Pharmacy Records  amLODipine  (NORVASC ) 10 MG tablet [id number] Yes Take 1 tablet (10 mg total) by mouth daily. TAKE 1 TABLET(10 MG) BY MOUTH DAILY Cox, Kirsten, MD  Active   atorvastatin  (LIPITOR) 40 MG tablet [id number] Yes TAKE 1 TABLET BY MOUTH EVERY DAY Cox, Kirsten, MD  Active Self, Pharmacy Records  Cholecalciferol (VITAMIN D) 125 MCG (5000 UT) CAPS [id number] Yes Take 1 capsule by mouth in the morning. [provider]  Active Self, Pharmacy Records           Med Note EFRAIM, ALFREIDA CROME   Fri Nov 29, 2023  4:33 PM) Patient is out of this medication  levothyroxine  (SYNTHROID ) 100 MCG tablet [id number] Yes Take 1 tablet (100 mcg total) by mouth daily. Teressa Harrie CHRISTELLA, FNP  Active Self, Pharmacy Records  nitroGLYCERIN  (NITROSTAT ) 0.4 MG SL tablet [id number] Yes  Place 1 tablet (0.4 mg total) under the tongue every 5 (five) minutes as needed for chest pain. CoxAbigail, MD  Active Self, Pharmacy Records           Med Note [facility], ALFREIDA CROME   Fri Nov 29, 2023  4:06 PM)    ondansetron  (ZOFRAN -ODT) 4 MG disintegrating tablet [id number] Yes Take 1 tablet (4 mg total) by mouth every 8 (eight) hours as needed for nausea or vomiting. Sirivol, Mamatha, MD  Active Self, Pharmacy Records  rOPINIRole  (REQUIP ) 2 MG tablet [id number] Yes TAKE  1 TABLET(2 MG) BY MOUTH TWICE DAILY Cox, Kirsten, MD  Active Self, Pharmacy Records  VENTOLIN  HFA 108 [phone] Base) MCG/ACT inhaler [id number] Yes Inhale 1-2 puffs into the lungs every 6 (six) hours as needed for wheezing or shortness of breath. CoxAbigail, MD  Active Self, Pharmacy Records           Med Note BUZZ DOROTHYANN ONEIDA Sonjia Jan 08, 2023  3:05 PM)              Recommendation:   Continue Current Plan of Care  Follow Up Plan:   12/26/23 4pm  Shona Prow RN, CCM [place]  VBCI-Population Health RN Care Manager [phone]

## 2023-12-23 ENCOUNTER — Telehealth: Payer: Self-pay

## 2023-12-23 NOTE — Transitions of Care (Post Inpatient/ED Visit) (Signed)
   12/23/2023  Name: Alexandra [name] MRN: [id number] DOB: 26-Apr-1941  Today's TOC FU Call Status: Unsuccessful Call (1st Attempt) Date: 12/23/23  Attempted to reach the patient regarding the most recent Inpatient/ED visit.  Follow Up Plan: Additional outreach attempts will be made to reach the patient to complete the Transitions of Care (Post Inpatient/ED visit) call.   Medford Balboa, BSN, RN [place]  VBCI - [employer] [facility] RN Care Manager [phone]

## 2023-12-24 ENCOUNTER — Telehealth: Payer: Self-pay

## 2023-12-24 NOTE — Transitions of Care (Post Inpatient/ED Visit) (Signed)
   12/24/2023  Name: Alexandra [name] MRN: [id number] DOB: 1940-07-13  Today's TOC FU Call Status: Today's TOC FU Call Status:: Unsuccessful Call (2nd Attempt) Unsuccessful Call (2nd Attempt) Date: 12/24/23  Attempted to reach the patient regarding the most recent Inpatient/ED visit.  Follow Up Plan: Additional outreach attempts will be made to reach the patient to complete the Transitions of Care (Post Inpatient/ED visit) call.   Shona Prow RN, CCM [place]  VBCI-Population Health RN Care Manager [phone]

## 2023-12-25 ENCOUNTER — Telehealth: Payer: Self-pay

## 2023-12-25 NOTE — Transitions of Care (Post Inpatient/ED Visit) (Signed)
   12/25/2023  Name: Alexandra [name] MRN: [id number] DOB: 02-Sep-1940  Today's TOC FU Call Status: Today's TOC FU Call Status:: Unsuccessful Call (3rd Attempt) Unsuccessful Call (3rd Attempt) Date: 12/25/23  Attempted to reach the patient regarding the most recent Inpatient/ED visit.  Follow Up Plan: No further outreach attempts will be made at this time. We have been unable to contact the patient. Shona Prow RN, CCM [place]  VBCI-Population Health RN Care Manager [phone]

## 2023-12-26 ENCOUNTER — Ambulatory Visit

## 2023-12-27 ENCOUNTER — Ambulatory Visit

## 2023-12-27 VITALS — BP 145/74 | HR 99 | Ht 62.0 in | Wt 127.0 lb

## 2023-12-27 DIAGNOSIS — I10 Essential (primary) hypertension: Secondary | ICD-10-CM | POA: Diagnosis present

## 2023-12-27 DIAGNOSIS — I5042 Chronic combined systolic (congestive) and diastolic (congestive) heart failure: Secondary | ICD-10-CM | POA: Insufficient documentation

## 2023-12-27 DIAGNOSIS — I509 Heart failure, unspecified: Secondary | ICD-10-CM | POA: Insufficient documentation

## 2023-12-27 MED ORDER — METOPROLOL SUCCINATE ER 25 MG PO TB24: 25.0000 mg | ORAL_TABLET | Freq: Every day | ORAL | 3 refills | Status: DC

## 2023-12-27 NOTE — Progress Notes (Signed)
 Cardiology Consultation:    Date:  12/27/2023   ID:  Alexandra [name], DOB 05-17-1941, MRN [id number]  PCP:  Sherre Clapper, MD  Cardiologist:  Alean SAUNDERS Layth Cerezo, MD   Referring MD: Sherre Clapper, MD   No chief complaint on file.    ASSESSMENT AND PLAN:   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   Problem List Items Addressed This Visit     Essential hypertension   Well-controlled. Target blood pressure below 140/80 mmHg. A follow-up echocardiogram shows improvement in LV function will continue with amlodipine , I follow-up echocardiogram shows send drop in LV function will wean off amlodipine  while titrating up doses of metoprolol  and introduce hydralazine /isosorbide dinitrate.       Relevant Medications   hydrALAZINE  (APRESOLINE ) 25 MG tablet   metoprolol  succinate (TOPROL  XL) 25 MG 24 hr tablet   Chronic CHF (congestive heart failure) (HCC) - Primary   Appears compensated. Mildly euvolemic, improved per patient since being started on dialysis. NYHA class III with functional limitations due to comorbidities.  ESRD hemodialysis dependent for fluid status management. Last echocardiogram EF 45-50% March 2024.  - Will start her on low-dose metoprolol  succinate 25 mg once daily. If tolerating well will discontinue amlodipine  while titrating up guideline-directed medical therapy for heart failure by introducing hydralazine  and isosorbide nitrate. - Not a candidate for Entresto , SGLT2 inhibitors or spironolactone given her renal failure.  Will follow-up and consider repeat echocardiogram after next visit tentatively in 1 month.        Relevant Medications   hydrALAZINE   (APRESOLINE ) 25 MG tablet   metoprolol  succinate (TOPROL  XL) 25 MG 24 hr tablet      History of Present Illness:    Alexandra [name] is a 83 y.o. female who is being seen today for follow-up visit. PCP is Cox, Clapper, MD. Last visit with us  was 10-29-2022 with Dr. Monetta.  Here for the visit today accompanied by her son.  Lives at home with her husband.  Son's family lives nearby and help them out.  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 Last echocardiogram March 2024 with LVEF 45 to 50%, mildly dilated left atrium, normal RV function, mild to moderate MR.  Reviewed images myself.  Mentions her lower extremity edema has improved since being started on dialysis. Denies any chest pain.  No significant shortness of breath or orthopnea. Overall urine output has decreased since starting dialysis. Denies any palpitations, lightheadedness, dizziness or syncopal episodes.  Good compliance with her medications.  Recent EKG from 11/29/2023 noted sinus rhythm heart rate 90/min, PR interval 138 ms, QRS duration 96 ms, borderline right axis deviation.  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 83 yo   AV FISTULA  PLACEMENT Left 12/04/2023   Procedure: BRACHIAL BASILIC  (AV) FISTULA CREATION;  Surgeon: Serene Gaile ORN, MD;  Location: [facility] OR;  Service: Vascular;  Laterality: Left;   BACK SURGERY  2016   lower L 4 to L 5   bladder tack and uterus lift  10/10/2015   CHOLECYSTECTOMY     summer of 2022   CYSTOSCOPY WITH RETROGRADE PYELOGRAM, URETEROSCOPY AND STENT PLACEMENT Right 08/04/2022   Procedure: CYSTOSCOPY WITH RIGHT URETERAL STENT EXCHANGE;  Surgeon: Lovie Arlyss CROME, MD;  Location: [facility] OR;  Service: Urology;  Laterality: Right;   INSERTION OF DIALYSIS CATHETER N/A 12/04/2023   Procedure: INSERTION OF DIALYSIS CATHETER, REMOVAL OF PREVIOUS DIALYSIS CATHETER;  Surgeon: Serene Gaile ORN, MD;  Location: [facility] OR;  Service: Vascular;  Laterality: N/A;   IR RADIOLOGIST EVAL & MGMT  12/03/2018   IR RADIOLOGIST EVAL & MGMT  12/31/2018   IR REMOVAL TUN ACCESS W/ PORT W/O FL MOD SED  08/08/2022   PARATHYROIDECTOMY Left 07/02/2016   Procedure: LEFT INFERIOR PARATHYROIDECTOMY;  Surgeon: Krystal Spinner, MD;  Location: [facility] ORS;  Service: General;  Laterality: Left;   SHOULDER OPEN ROTATOR CUFF REPAIR  12/2019   TEE WITHOUT CARDIOVERSION N/A 08/10/2022   Procedure: TRANSESOPHAGEAL ECHOCARDIOGRAM (TEE);  Surgeon: Mona Vinie BROCKS, MD;  Location: [facility] ENDOSCOPY;  Service: Cardiovascular;  Laterality: N/A;   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    Current Medications: Current Meds  Medication Sig   acetaminophen  (TYLENOL ) 500 MG tablet Take 1,000 mg by mouth every 6 (six) hours as needed for mild pain (pain score 1-3) or moderate pain (pain score 4-6).   amLODipine  (NORVASC ) 10 MG tablet Take 1 tablet (10 mg total) by mouth daily. TAKE 1 TABLET(10 MG) BY MOUTH DAILY   atorvastatin  (LIPITOR) 40 MG tablet TAKE 1 TABLET BY MOUTH EVERY DAY   Cholecalciferol (VITAMIN D) 125 MCG (5000 UT) CAPS Take 1 capsule by mouth in the morning.   hydrALAZINE  (APRESOLINE ) 25 MG tablet Take 25 mg by mouth 2 (two) times daily.    levothyroxine  (SYNTHROID ) 100 MCG tablet Take 1 tablet (100 mcg total) by mouth daily.   metoprolol  succinate (TOPROL  XL) 25 MG 24 hr tablet Take 1 tablet (25 mg total) by mouth daily.   nitroGLYCERIN  (NITROSTAT ) 0.4 MG SL tablet Place 1 tablet (0.4 mg total) under the tongue every 5 (five) minutes as needed for chest pain.   ondansetron  (ZOFRAN -ODT) 4 MG disintegrating tablet Take 1 tablet (4 mg total) by mouth every 8 (eight) hours as needed for nausea or vomiting.   rOPINIRole  (REQUIP ) 2 MG tablet TAKE 1 TABLET(2 MG) BY MOUTH TWICE DAILY   VENTOLIN  HFA 108 (90 Base) MCG/ACT inhaler Inhale 1-2 puffs into the lungs every 6 (six) hours as needed for wheezing or shortness of breath.     Allergies:   Contrast media [iodinated contrast media], Omeprazole , Shellfish allergy, and Shellfish-derived products   Social History   Socioeconomic History   Marital status: Married    Spouse name: Lamar   Number of children: 2   Years of education: 11   Highest education level: 11th grade  Occupational History   Occupation: Retired  Tobacco Use   Smoking status: Former  Current packs/day: 0.00    Average packs/day: 1 pack/day for 50.0 years (50.0 ttl pk-yrs)    Types: Cigarettes    Start date: 06/12/1955    Quit date: 06/11/2005    Years since quitting: 18.5   Smokeless tobacco: Never  Vaping Use   Vaping status: Never Used  Substance and Sexual Activity   Alcohol use: No   Drug use: No   Sexual activity: Yes    Partners: Male  Other Topics Concern   Not on file  Social History Narrative   Not on file   Social Drivers of Health   Financial Resource Strain: Medium Risk (09/03/2023)   Overall Financial Resource Strain (CARDIA)    Difficulty of Paying Living Expenses: Somewhat hard  Food Insecurity: No Food Insecurity (12/11/2023)   Hunger Vital Sign    Worried About Running Out of Food in the Last Year: Never true    Ran Out of Food in the Last Year: Never true  Transportation Needs: No  Transportation Needs (12/11/2023)   PRAPARE - Administrator, Civil Service (Medical): No    Lack of Transportation (Non-Medical): No  Physical Activity: Inactive (11/25/2023)   Exercise Vital Sign    Days of Exercise per Week: 0 days    Minutes of Exercise per Session: 0 min  Stress: Stress Concern Present (11/25/2023)   [employer] of Occupational Health - Occupational Stress Questionnaire    Feeling of Stress: To some extent  Social Connections: Socially Isolated (11/30/2023)   Social Connection and Isolation Panel    Frequency of Communication with Friends and Family: Once a week    Frequency of Social Gatherings with Friends and Family: Once a week    Attends Religious Services: Never    Database administrator or Organizations: No    Attends Engineer, structural: Never    Marital Status: Married     Family History: The patient's family history includes COPD in her sister; Emphysema in her mother; Hyperlipidemia in her brother, sister, and sister; Hypertension in her brother, sister, and sister; Lymphoma in her sister; Obesity in her brother. There is no history of Breast cancer. ROS:   Please see the history of present illness.    All 14 point review of systems negative except as described per history of present illness.  EKGs/Labs/Other Studies Reviewed:    The following studies were reviewed today:   EKG:       Recent Labs: 07/16/2023: TSH 4.350 11/29/2023: B Natriuretic Peptide 1,321.3; Magnesium  1.4 12/12/2023: ALT 5; BUN 24; Creatinine, Ser 2.69; Hemoglobin 10.5; Platelets 201; Potassium 4.4; Sodium 138  Recent Lipid Panel    Component Value Date/Time   CHOL 164 11/28/2023 0839   TRIG 82 11/28/2023 0839   HDL 63 11/28/2023 0839   CHOLHDL 2.6 11/28/2023 0839   LDLCALC 86 11/28/2023 0839    Physical Exam:    VS:  BP (!) 145/74 (BP Location: Right Arm)   Pulse 99   Ht 5' 2 (1.575 m)   Wt 127 lb (57.6 kg)   SpO2 97%   BMI 23.23 kg/m      Wt Readings from Last 3 Encounters:  12/27/23 127 lb (57.6 kg)  12/12/23 136 lb (61.7 kg)  12/05/23 149 lb 4 oz (67.7 kg)     GENERAL:  Well nourished, well developed in no acute distress NECK: No JVD; No carotid bruits. Right internal jugular tunneled dialysis catheter present. CARDIAC: RRR, S1 and S2 present, no  murmurs, no rubs, no gallops CHEST:  Clear to auscultation without rales, wheezing or rhonchi  Extremities: No pitting pedal edema. Pulses bilaterally symmetric with radial 2+ and dorsalis pedis 2+.  Left basilic vein fistula created 3/74/7974, no significant thrill on palpation. NEUROLOGIC:  Alert and oriented x 3  Medication Adjustments/Labs and Tests Ordered: Current medicines are reviewed at length with the patient today.  Concerns regarding medicines are outlined above.  No orders of the defined types were placed in this encounter.  Meds ordered this encounter  Medications   metoprolol  succinate (TOPROL  XL) 25 MG 24 hr tablet    Sig: Take 1 tablet (25 mg total) by mouth daily.    Dispense:  90 tablet    Refill:  3    Signed, Rawan Riendeau reddy Caran Storck, MD, MPH, [facility]. 12/27/2023 5:59 PM    [facility]

## 2023-12-27 NOTE — Assessment & Plan Note (Addendum)
 Appears compensated. Mildly euvolemic, improved per patient since being started on dialysis. NYHA class III with functional limitations due to comorbidities.  ESRD hemodialysis dependent for fluid status management. Last echocardiogram EF 45-50% March 2024.  - Will start her on low-dose metoprolol  succinate 25 mg once daily. If tolerating well will discontinue amlodipine  while titrating up guideline-directed medical therapy for heart failure by introducing hydralazine  and isosorbide nitrate. - Not a candidate for Entresto , SGLT2 inhibitors or spironolactone given her renal failure.  Will follow-up and consider repeat echocardiogram after next visit tentatively in 1 month.

## 2023-12-27 NOTE — Patient Instructions (Signed)
 Medication Instructions:  Your physician has recommended you make the following change in your medication:   START: Metoprolol  succinate 25 mg daily  *If you need a refill on your cardiac medications before your next appointment, please call your pharmacy*  Lab Work: None If you have labs (blood work) drawn today and your tests are completely normal, you will receive your results only by: MyChart Message (if you have MyChart) OR A paper copy in the mail If you have any lab test that is abnormal or we need to change your treatment, we will call you to review the results.  Testing/Procedures: None  Follow-Up: At [facility], you and your health needs are our priority.  As part of our continuing mission to provide you with exceptional heart care, our providers are all part of one team.  This team includes your primary Cardiologist (physician) and Advanced Practice Providers or APPs (Physician Assistants and Nurse Practitioners) who all work together to provide you with the care you need, when you need it.  Your next appointment:   3 month(s)  Provider:   Alean Kobus, MD    We recommend signing up for the patient portal called MyChart.  Sign up information is provided on this After Visit Summary.  MyChart is used to connect with patients for Virtual Visits (Telemedicine).  Patients are able to view lab/test results, encounter notes, upcoming appointments, etc.  Non-urgent messages can be sent to your provider as well.   To learn more about what you can do with MyChart, go to [email or web address].   Other Instructions None

## 2023-12-27 NOTE — Assessment & Plan Note (Signed)
 Well-controlled. Target blood pressure below 140/80 mmHg. A follow-up echocardiogram shows improvement in LV function will continue with amlodipine , I follow-up echocardiogram shows send drop in LV function will wean off amlodipine  while titrating up doses of metoprolol  and introduce hydralazine /isosorbide dinitrate.

## 2023-12-30 ENCOUNTER — Other Ambulatory Visit: Payer: Self-pay | Admitting: Licensed Clinical Social Worker

## 2023-12-30 ENCOUNTER — Other Ambulatory Visit: Payer: Self-pay

## 2023-12-30 NOTE — Patient Instructions (Signed)
 Visit Information  Thank you for taking time to visit with me today. Please don't hesitate to contact me if I can be of assistance to you before our next scheduled appointment.  Our next appointment is by telephone on 02/11/24 at 10:00 AM   Please call the care guide team at [phone] if you need to cancel or reschedule your appointment.   Following is a copy of your care plan:   Goals Addressed             This Visit's Progress    VBCI Social Work Care Plan       Problems:   Pain Issues in Right Hip             Challenges in managing medical needs             Monitoring of kidney issue (CKD Stage V) Client goes to and from dialysis for treatments on Mondays, Wednesdays and Fridays             Decreased sleep occasionally             Unsteady Gait. Uses walker to help her walk  CSW Clinical Goal(s):   Over the next 30 days the Patient will attend all scheduled medical appointments as evidenced by patient report and care team review of appointment completion in electronic MEDICAL RECORD NUMBER .              Over next 30 days patient will be able to complete daily ADLs and complete daily activities of choice with help from family members AEB patient report to LCSW  Interventions:  LCSW spoke with client today via phone about client needs and status            Discussed sleeping challenges of client, and pain issues of client            Discussed family support for client with Lamar (spouse) and client daughter            Discussed transport needs of client           Discussed medication procurement of client           Discussed anxiety of client and mood of client.Client feels anxious occasionally . She takes medications as prescribed. She tries to use coping skills to manage anxiety symptoms faced           Encouraged client or Lamar to call LCSW as needed for SW support for client [phone])             Discussed dialysis treatments of client             Client was   appreciative of phone call from LCSW today to discuss needs of client  Patient Goals/Self-Care Activities:  Take medications as prescribed             Attend scheduled medical appointments             Allow time for ADLs completion             Discuss daily needs with her family members in order to receive help as needed from family members             Attend PCP appointments as scheduled             Attend weekly dialysis treatments as scheduled.             Call LCSW as needed for SW support  Plan:  LCSW to call client on 02/11/24 at 10:00 AM         Please go to [facility] [address] [place] [phone]) if you are experiencing a Mental Health or Behavioral Health Crisis or need someone to talk to.  The patient verbalized understanding of instructions, educational materials, and care plan provided today and DECLINED offer to receive copy of patient instructions, educational materials, and care plan.    Alexandra [name]  MSW, LCSW [place] Licensed Clinical Social Worker Direct Dial:  [phone] Fax:  [phone] Website:  [email or web address]

## 2023-12-30 NOTE — Patient Outreach (Signed)
 Complex Care Management   Visit Note  12/30/2023  Name:  Alexandra [name] MRN: [id number] DOB: 09-Sep-1940  Situation: Referral received for Complex Care Management related to ESRD I obtained verbal consent from Patient.  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   Hypertension    Osteoporosis    Restless legs syndrome    RLS (restless legs syndrome)    SIRS (systemic inflammatory response syndrome) (HCC) 08/03/2022    Assessment: Patient Reported Symptoms:  Cognitive Cognitive Status: Alert and oriented to person, place, and time Cognitive/Intellectual Conditions Management [RPT]: None reported or documented in medical history or problem list   Health Maintenance Behaviors: Stress management  Neurological Neurological Review of Symptoms: No symptoms reported Neurological Management Strategies: Coping strategies  HEENT HEENT Symptoms Reported: Change or loss of hearing (hearing challenges) HEENT Management Strategies: Coping strategies    Cardiovascular Cardiovascular Symptoms Reported: Fatigue Does patient have uncontrolled Hypertension?: No Cardiovascular Management Strategies: Medication therapy, Coping strategies  Respiratory Respiratory Symptoms Reported: Shortness of breath Other Respiratory Symptoms: fatigues occasionally; short of breath occasionally Respiratory Management Strategies: Adequate rest, Coping strategies  Endocrine Endocrine Symptoms Reported: No symptoms reported    Gastrointestinal Gastrointestinal Symptoms Reported: Constipation Gastrointestinal Management Strategies: Coping strategies, Adequate rest    Genitourinary Additional Genitourinary Details: ESRD on dialysis M, W, F Genitourinary  Management Strategies: Hemodialysis, Medication therapy  Integumentary Integumentary Symptoms Reported: Skin changes Skin Management Strategies: Dressing changes  Musculoskeletal Musculoskelatal Symptoms Reviewed: Unsteady gait, Weakness Musculoskeletal Management Strategies: Medical device      Psychosocial Psychosocial Symptoms Reported: Anxiety - if selected complete GAD, Sadness - if selected complete PHQ 2-9, Depression - if selected complete PHQ 2-9 Behavioral Management Strategies: Coping strategies, Adequate rest, Counseling Major Change/Loss/Stressor/Fears (CP): Medical condition, self Techniques to Cope with Loss/Stress/Change: Counseling, Medication Quality of Family Relationships: supportive Do you feel physically threatened by others?: No      12/30/2023    2:13 PM  Depression screen PHQ 2/9  Decreased Interest 1  Down, Depressed, Hopeless 1  PHQ - 2 Score 2  Altered sleeping 1  Tired, decreased energy 1  Change in appetite 1  Feeling bad or failure about yourself  1  Trouble concentrating 1  Moving slowly or fidgety/restless 1  Suicidal thoughts 0  PHQ-9 Score 8  Difficult doing work/chores Somewhat difficult    Vitals:   BP for client is being monitored at client PCP office Medications Reviewed Today     Reviewed by Frances Ozell GORMAN KEN (Social Worker) on 12/30/23 at 1412  Med List Status: <None>   Medication Order Taking? Sig Documenting Provider Last Dose Status Informant  acetaminophen  (TYLENOL ) 500 MG tablet [id number] Yes Take 1,000 mg by mouth every 6 (six) hours as needed for mild pain (pain score 1-3) or moderate pain (pain score 4-6). [provider]  Active Self, Pharmacy Records  amLODipine  (NORVASC ) 10 MG tablet [id number] Yes Take 1 tablet (10 mg total) by mouth daily. TAKE 1 TABLET(10 MG) BY MOUTH DAILY Cox, Kirsten, MD  Active   atorvastatin  (LIPITOR) 40 MG tablet [id number] Yes TAKE 1 TABLET BY MOUTH EVERY DAY Cox, Kirsten, MD  Active  Self, Pharmacy Records  Cholecalciferol (VITAMIN D) 125 MCG (5000 UT)  CAPS [id number] Yes Take 1 capsule by mouth in the morning. [provider]  Active Self, Pharmacy Records             hydrALAZINE  (APRESOLINE ) 25 MG tablet [id number] Yes Take 25 mg by mouth 2 (two) times daily. [provider]  Active   levothyroxine  (SYNTHROID ) 100 MCG tablet [id number] Yes Take 1 tablet (100 mcg total) by mouth daily. Teressa Harrie HERO, FNP  Active Self, Pharmacy Records  metoprolol  succinate (TOPROL  XL) 25 MG 24 hr tablet [id number] Yes Take 1 tablet (25 mg total) by mouth daily. Madireddy, Alean SAUNDERS, MD  Active   nitroGLYCERIN  (NITROSTAT ) 0.4 MG SL tablet [id number] Yes Place 1 tablet (0.4 mg total) under the tongue every 5 (five) minutes as needed for chest pain. Cox, Kirsten, MD  Active Self, Pharmacy Records               ondansetron  (ZOFRAN -ODT) 4 MG disintegrating tablet [id number] Yes Take 1 tablet (4 mg total) by mouth every 8 (eight) hours as needed for nausea or vomiting. Sirivol, Mamatha, MD  Active Self, Pharmacy Records  rOPINIRole  (REQUIP ) 2 MG tablet [id number] Yes TAKE 1 TABLET(2 MG) BY MOUTH TWICE DAILY Cox, Kirsten, MD  Active Self, Pharmacy Records  VENTOLIN  HFA 108 [phone] Base) MCG/ACT inhaler [id number] Yes Inhale 1-2 puffs into the lungs every 6 (six) hours as needed for wheezing or shortness of breath. CoxAbigail, MD  Active Self, Pharmacy Records                       Recommendation:   PCP Follow-up Continue Current Plan of Care Take medications as prescribed Attend dislysis weekly appointments as scheduled Call LCSW as needed for SW support Allow time for ADLs completion  Follow Up Plan:   Telephone follow up appointment date/time:  02/11/24 at 10:00 AM    Glendia Pear  MSW, LCSW [place]/Value Based Care [facility] Licensed Clinical Social Worker Direct Dial:  [phone] Fax:  [phone] Website:  [email or web address]

## 2024-01-06 ENCOUNTER — Other Ambulatory Visit: Payer: Self-pay

## 2024-01-06 ENCOUNTER — Ambulatory Visit: Payer: Self-pay | Admitting: Family Medicine

## 2024-01-06 DIAGNOSIS — N186 End stage renal disease: Secondary | ICD-10-CM

## 2024-01-13 ENCOUNTER — Encounter (HOSPITAL_COMMUNITY)

## 2024-01-13 ENCOUNTER — Encounter

## 2024-01-16 ENCOUNTER — Other Ambulatory Visit: Payer: Self-pay | Admitting: Family Medicine

## 2024-01-16 DIAGNOSIS — G2581 Restless legs syndrome: Secondary | ICD-10-CM

## 2024-01-20 ENCOUNTER — Telehealth: Payer: Self-pay | Admitting: Family Medicine

## 2024-01-23 ENCOUNTER — Ambulatory Visit (HOSPITAL_COMMUNITY)

## 2024-01-24 DIAGNOSIS — N179 Acute kidney failure, unspecified: Secondary | ICD-10-CM | POA: Diagnosis not present

## 2024-01-24 DIAGNOSIS — R6510 Systemic inflammatory response syndrome (SIRS) of non-infectious origin without acute organ dysfunction: Secondary | ICD-10-CM

## 2024-01-24 DIAGNOSIS — I132 Hypertensive heart and chronic kidney disease with heart failure and with stage 5 chronic kidney disease, or end stage renal disease: Secondary | ICD-10-CM | POA: Diagnosis not present

## 2024-01-24 DIAGNOSIS — N390 Urinary tract infection, site not specified: Secondary | ICD-10-CM

## 2024-01-24 DIAGNOSIS — M800AXD Age-related osteoporosis with current pathological fracture, other site, subsequent encounter for fracture with routine healing: Secondary | ICD-10-CM

## 2024-01-24 DIAGNOSIS — J9611 Chronic respiratory failure with hypoxia: Secondary | ICD-10-CM

## 2024-01-24 DIAGNOSIS — J449 Chronic obstructive pulmonary disease, unspecified: Secondary | ICD-10-CM

## 2024-01-24 DIAGNOSIS — D631 Anemia in chronic kidney disease: Secondary | ICD-10-CM

## 2024-01-24 DIAGNOSIS — I5022 Chronic systolic (congestive) heart failure: Secondary | ICD-10-CM | POA: Diagnosis not present

## 2024-01-24 DIAGNOSIS — N1330 Unspecified hydronephrosis: Secondary | ICD-10-CM

## 2024-01-24 DIAGNOSIS — N186 End stage renal disease: Secondary | ICD-10-CM | POA: Diagnosis not present

## 2024-01-24 DIAGNOSIS — Z992 Dependence on renal dialysis: Secondary | ICD-10-CM

## 2024-02-03 ENCOUNTER — Other Ambulatory Visit: Payer: Self-pay

## 2024-02-03 ENCOUNTER — Telehealth: Payer: Self-pay

## 2024-02-03 NOTE — Telephone Encounter (Signed)
 [name], RN [facility] called asking for input re: having pt's R internal jugular dialysis catheter removed  Will consult provider and call Alexandra [name] back.

## 2024-02-04 ENCOUNTER — Telehealth: Payer: Self-pay

## 2024-02-04 NOTE — Telephone Encounter (Signed)
 [name] Chin RN [facility] called to report that the patient is requesting TDC removal.   The request was given to the surgery scheduler for processing.   [name] gave approval for billing to go to [facility] [place].

## 2024-02-04 NOTE — Telephone Encounter (Signed)
  Per instruction from Donnice Sender PA, the patient's nephrology [facility] was notified of patient's request to remove [facility].

## 2024-02-05 ENCOUNTER — Telehealth: Payer: Self-pay

## 2024-02-05 NOTE — Telephone Encounter (Signed)
 Patient has been scheduled for [facility] removal at [facility].   9/8 at 1:00p with any PA available for removal. [name], RN w/ [facility] informed.

## 2024-02-11 ENCOUNTER — Other Ambulatory Visit: Payer: Self-pay | Admitting: Licensed Clinical Social Worker

## 2024-02-11 NOTE — Patient Outreach (Signed)
 Complex Care Management   Visit Note  02/11/2024  Name:  Alexandra [name] MRN: [id number] DOB: Aug 18, 1940  Situation: Referral received for Complex Care Management related to anxiety and stress management  I obtained verbal consent from [name], spouse of client.  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   Hypertension    Osteoporosis    Restless legs syndrome    RLS (restless legs syndrome)    SIRS (systemic inflammatory response syndrome) (HCC) 08/03/2022    Assessment: Patient Reported Symptoms:  Cognitive Cognitive Status: Difficulties with attention and concentration Cognitive/Intellectual Conditions Management [RPT]: None reported or documented in medical history or problem list   Health Maintenance Behaviors: Stress management Health Facilitated by: Stress management  Neurological Neurological Review of Symptoms: Weakness (ESRD) Neurological Management Strategies: Coping strategies  HEENT HEENT Symptoms Reported: Change or loss of hearing HEENT Management Strategies: Coping strategies    Cardiovascular Cardiovascular Symptoms Reported: Fatigue Does patient have uncontrolled Hypertension?: No    Respiratory Respiratory Symptoms Reported: Shortness of breath Other Respiratory Symptoms: fatigue Respiratory Management Strategies: Adequate rest, Coping strategies  Endocrine Endocrine Symptoms Reported: No symptoms reported    Gastrointestinal Gastrointestinal Symptoms Reported: Constipation Gastrointestinal Management Strategies: Coping strategies    Genitourinary Genitourinary Symptoms Reported: No symptoms reported Additional Genitourinary Details: ESRD Genitourinary  Management Strategies: Medication therapy  Integumentary Integumentary Symptoms Reported: Skin changes Skin Management Strategies: Coping strategies, Dressing changes  Musculoskeletal Musculoskelatal Symptoms Reviewed: Unsteady gait, Weakness Musculoskeletal Management Strategies: Medical device      Psychosocial Psychosocial Symptoms Reported: Sadness - if selected complete PHQ 2-9, Anxiety - if selected complete GAD Additional Psychological Details: Receiving in home Hospice care Behavioral Management Strategies: Coping strategies Major Change/Loss/Stressor/Fears (CP): Medical condition, self Techniques to Cope with Loss/Stress/Change: Counseling, Medication Quality of Family Relationships: supportive Do you feel physically threatened by others?: No Receiving Hospice Care in the home as needed    02/11/2024    PHQ2-9 Depression Screening   Little interest or pleasure in doing things Several days  Feeling down, depressed, or hopeless Several days  PHQ-2 - Total Score 2  Trouble falling or staying asleep, or sleeping too much Several days  Feeling tired or having little energy Several days  Poor appetite or overeating  Several days  Feeling bad about yourself - or that you are a failure or have let yourself or your family down Several days  Trouble concentrating on things, such as reading the newspaper or watching television Several days  Moving or speaking so slowly that other people could have noticed.  Or the opposite - being so fidgety or restless that you have been moving around a lot more than usual Several days  Thoughts that you would be better off dead, or hurting yourself in some way Not at all  PHQ2-9 Total Score 8  If you checked off any problems, how difficult have these problems made it for you to do your work, take care of things at home, or get along with other people Somewhat difficult  Depression Interventions/Treatment Counseling    There were no vitals filed for  this visit.  Medications Reviewed Today     Reviewed by Frances Ozell GORMAN KEN (Social  Worker) on 02/11/24 at 0949  Med List Status: <None>   Medication Order Taking? Sig Documenting Provider Last Dose Status Informant  acetaminophen  (TYLENOL ) 500 MG tablet [id number] Yes Take 1,000 mg by mouth every 6 (six) hours as needed for mild pain (pain score 1-3) or moderate pain (pain score 4-6). [provider]  Active Self, Pharmacy Records  amLODipine  (NORVASC ) 10 MG tablet [id number] Yes Take 1 tablet (10 mg total) by mouth daily. TAKE 1 TABLET(10 MG) BY MOUTH DAILY Cox, Kirsten, MD  Active   atorvastatin  (LIPITOR) 40 MG tablet [id number] Yes TAKE 1 TABLET BY MOUTH EVERY DAY Cox, Kirsten, MD  Active Self, Pharmacy Records  Cholecalciferol (VITAMIN D) 125 MCG (5000 UT) CAPS [id number] Yes Take 1 capsule by mouth in the morning. [provider]  Active Self, Pharmacy Records             hydrALAZINE  (APRESOLINE ) 25 MG tablet [id number] Yes Take 25 mg by mouth 2 (two) times daily. [provider]  Active   levothyroxine  (SYNTHROID ) 100 MCG tablet [id number] Yes Take 1 tablet (100 mcg total) by mouth daily. Teressa Harrie HERO, FNP  Active Self, Pharmacy Records  metoprolol  succinate (TOPROL  XL) 25 MG 24 hr tablet [id number] Yes Take 1 tablet (25 mg total) by mouth daily. Madireddy, Alean SAUNDERS, MD  Active   nitroGLYCERIN  (NITROSTAT ) 0.4 MG SL tablet [id number] Yes Place 1 tablet (0.4 mg total) under the tongue every 5 (five) minutes as needed for chest pain. Cox, Kirsten, MD  Active Self, Pharmacy Records             ondansetron  (ZOFRAN -ODT) 4 MG disintegrating tablet [id number] Yes Take 1 tablet (4 mg total) by mouth every 8 (eight) hours as needed for nausea or vomiting. Sirivol, Mamatha, MD  Active Self, Pharmacy Records  rOPINIRole  (REQUIP ) 2 MG tablet [id number] Yes TAKE 1 TABLET(2 MG) BY MOUTH TWICE DAILY Cox, Kirsten, MD  Active   VENTOLIN  HFA 108 [phone] Base) MCG/ACT inhaler [id number]  Yes Inhale 1-2 puffs into the lungs every 6 (six) hours as needed for wheezing or shortness of breath. Cox, Kirsten, MD  Active Self, Pharmacy Records                    Recommendation:   PCP Follow-up Communicate client needs with Hospice staff to seek in home Hospice care support for client as needed  Follow Up Plan:   No follow up calls needed. Client is discharged from program support today since client is now receiving Hospice care in the home.   Glendia Pear  MSW, LCSW [place] Licensed Clinical Social Worker Direct Dial:  [phone] Fax:  [phone] Website:  [email or web address]

## 2024-02-11 NOTE — Patient Instructions (Signed)
 Visit Information  Thank you for taking time to visit with me today. Please don't hesitate to contact me if I can be of assistance to you before our next scheduled appointment.  No follow up calls needed. Client is dis-enrolled from program support. She now receives Hospice Care in the home.   Please call the care guide team at [phone] if you need to cancel or reschedule your appointment.   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 Uses walker to help her walk  CSW Clinical Goal(s):   Over the next 30 days the Patient will attend all scheduled medical appointments as evidenced by patient report and care team review of appointment completion in electronic MEDICAL RECORD NUMBER .              Over next 30 days patient will be able to complete daily ADLs and complete daily activities of choice with help from family members AEB patient report to LCSW  Interventions:  LCSW spoke with [name], spouse of client today via phone about client needs and status            Discussed sleeping challenges of client, and pain issues of client            Discussed medication procurement of client            Lamar said client has not been able to go to dialysis recently . He said client is receiving Hospice care in the home.  Hospice is working with client on pain management and providing for support for client. Lamar said Hospice support has been very helpful for client needs              Discussed Hospice support services and ways Hospice could be supportive to client at this time.               Encouraged client or Lamar to utilize Hospice services as needed to help with client needs.                 [name]  was  appreciative of phone call from LCSW today to discuss needs of client               LCSW to discharge client today from program services since client is now under Hospice support and care in her home.  Patient Goals/Self-Care Activities:  Take medications as prescribed             Attend scheduled medical appointments             Allow time for ADLs completion             Discuss daily needs with her family members in order to receive help as needed from family members             Attend PCP appointments as  scheduled              Call  Hospice as needed for client support in the home at this time.  Plan:   No follow up calls needed.         Please go to [facility] [address], [place] [phone]) if you are experiencing a Mental Health or Behavioral Health Crisis or need someone to talk to.  The patient verbalized understanding of instructions, educational materials, and care plan provided today and DECLINED offer to receive copy of patient instructions, educational materials, and care plan.    Alexandra [name]  MSW, LCSW [place] Licensed Clinical Social Worker Direct Dial:  [phone] Fax:  [phone] Website:  [email or web address]

## 2024-02-13 ENCOUNTER — Institutional Professional Consult (permissible substitution) (HOSPITAL_BASED_OUTPATIENT_CLINIC_OR_DEPARTMENT_OTHER): Admitting: Family

## 2024-02-17 ENCOUNTER — Encounter (HOSPITAL_COMMUNITY)
Admission: RE | Admit: 2024-02-17 | Discharge: 2024-02-17 | Disposition: A | Discharge status: Home / Self Care | Source: Ambulatory Visit | Attending: Physician Assistant | Admitting: Physician Assistant

## 2024-02-17 DIAGNOSIS — N186 End stage renal disease: Secondary | ICD-10-CM | POA: Insufficient documentation

## 2024-02-17 MED ORDER — LIDOCAINE HCL (PF) 2 % IJ SOLN
INTRAMUSCULAR | Status: AC
  Filled 2024-02-17: qty 10

## 2024-02-17 NOTE — Progress Notes (Signed)
 Right upper chest dressing CDI upon DC

## 2024-02-17 NOTE — Progress Notes (Signed)
  Procedure Note   PRE-OPERATIVE DIAGNOSIS:   ESRD.   POST-OPERATIVE DIAGNOSIS:  same.  PROCEDURE:  Removal of R IJ perm cath.   PROVIDER:   Donnice Sender PA-C.  ANESTHESIA:  Local  The risks and benefits of this procedure were explained to the patient and the patient consented.   OPERATIVE PROCEDURE:  The following procedure was performed at the bedside.  The right side of patient's neck and chest was prepped and draped in standard fashion.  Local anesthesia was infiltrated over the tunneled catheter and its cuff.  The cuff was loosened using a combination of blunt and sharp dissection.  The catheter was removed in its entirety, and hemostasis was achieved with local compression.    The patient tolerated the procedure well and did not have any complications.  RECOMMENDATIONS:   [name] will be observed for another 10-15 minutes to make sure catheter exit site remains hemostatic.  She will then be transported back to [facility].   Donnice Sender, PA-C [facility] [phone] 02/17/2024  2:01 PM

## 2024-02-18 ENCOUNTER — Other Ambulatory Visit: Payer: Self-pay | Admitting: Family Medicine

## 2024-02-27 ENCOUNTER — Ambulatory Visit

## 2024-03-24 ENCOUNTER — Encounter: Payer: Self-pay | Admitting: Oncology

## 2024-04-01 ENCOUNTER — Ambulatory Visit

## 2024-04-11 DEATH — deceased
